# Patient Record
Sex: Female | Born: 1940
Health system: Southern US, Community
[De-identification: ages and names within clinical notes are randomized; demographics above are authoritative.]

## PROBLEM LIST (undated history)

## (undated) DIAGNOSIS — C50919 Malignant neoplasm of unspecified site of unspecified female breast: Secondary | ICD-10-CM

## (undated) DIAGNOSIS — R42 Dizziness and giddiness: Secondary | ICD-10-CM

## (undated) DIAGNOSIS — D701 Agranulocytosis secondary to cancer chemotherapy: Secondary | ICD-10-CM

## (undated) DIAGNOSIS — T451X5A Adverse effect of antineoplastic and immunosuppressive drugs, initial encounter: Secondary | ICD-10-CM

## (undated) DIAGNOSIS — Z5181 Encounter for therapeutic drug level monitoring: Secondary | ICD-10-CM

## (undated) DIAGNOSIS — G459 Transient cerebral ischemic attack, unspecified: Secondary | ICD-10-CM

## (undated) DIAGNOSIS — M545 Low back pain, unspecified: Secondary | ICD-10-CM

## (undated) DIAGNOSIS — Z7189 Other specified counseling: Secondary | ICD-10-CM

## (undated) DIAGNOSIS — M1990 Unspecified osteoarthritis, unspecified site: Secondary | ICD-10-CM

## (undated) DIAGNOSIS — E785 Hyperlipidemia, unspecified: Secondary | ICD-10-CM

## (undated) DIAGNOSIS — I10 Essential (primary) hypertension: Secondary | ICD-10-CM

## (undated) DIAGNOSIS — F419 Anxiety disorder, unspecified: Secondary | ICD-10-CM

## (undated) DIAGNOSIS — I639 Cerebral infarction, unspecified: Secondary | ICD-10-CM

## (undated) HISTORY — DX: Anxiety disorder, unspecified: F41.9

## (undated) HISTORY — DX: Agranulocytosis secondary to cancer chemotherapy: D70.1

## (undated) HISTORY — DX: Other specified counseling: Z71.89

## (undated) HISTORY — DX: Cerebral infarction, unspecified: I63.9

## (undated) HISTORY — DX: Unspecified osteoarthritis, unspecified site: M19.90

## (undated) HISTORY — DX: Low back pain, unspecified: M54.50

## (undated) HISTORY — DX: Encounter for therapeutic drug level monitoring: Z51.81

## (undated) HISTORY — DX: Adverse effect of antineoplastic and immunosuppressive drugs, initial encounter: T45.1X5A

## (undated) HISTORY — DX: Low back pain: M54.5

## (undated) HISTORY — PX: BREAST LUMPECTOMY: SHX2

## (undated) HISTORY — DX: Essential (primary) hypertension: I10

## (undated) HISTORY — DX: Malignant neoplasm of unspecified site of unspecified female breast: C50.919

## (undated) HISTORY — DX: Hyperlipidemia, unspecified: E78.5

## (undated) HISTORY — DX: Transient cerebral ischemic attack, unspecified: G45.9

## (undated) HISTORY — PX: ABDOMINAL HYSTERECTOMY: SHX81

---

## 2001-04-28 ENCOUNTER — Encounter: Payer: Self-pay | Admitting: Obstetrics

## 2001-04-28 ENCOUNTER — Ambulatory Visit (HOSPITAL_COMMUNITY): Admission: RE | Admit: 2001-04-28 | Discharge: 2001-04-28 | Payer: Self-pay | Admitting: Obstetrics

## 2001-05-02 ENCOUNTER — Other Ambulatory Visit: Admission: RE | Admit: 2001-05-02 | Discharge: 2001-05-02 | Payer: Self-pay | Admitting: Obstetrics

## 2001-05-02 ENCOUNTER — Encounter: Payer: Self-pay | Admitting: Obstetrics

## 2001-05-02 ENCOUNTER — Encounter: Admission: RE | Admit: 2001-05-02 | Discharge: 2001-05-02 | Payer: Self-pay | Admitting: Obstetrics

## 2001-05-02 ENCOUNTER — Encounter (INDEPENDENT_AMBULATORY_CARE_PROVIDER_SITE_OTHER): Payer: Self-pay | Admitting: *Deleted

## 2001-05-17 ENCOUNTER — Encounter (HOSPITAL_BASED_OUTPATIENT_CLINIC_OR_DEPARTMENT_OTHER): Payer: Self-pay | Admitting: General Surgery

## 2001-05-18 ENCOUNTER — Ambulatory Visit (HOSPITAL_COMMUNITY): Admission: RE | Admit: 2001-05-18 | Discharge: 2001-05-19 | Payer: Self-pay | Admitting: General Surgery

## 2001-05-18 ENCOUNTER — Encounter (HOSPITAL_BASED_OUTPATIENT_CLINIC_OR_DEPARTMENT_OTHER): Payer: Self-pay | Admitting: General Surgery

## 2001-05-18 ENCOUNTER — Encounter (INDEPENDENT_AMBULATORY_CARE_PROVIDER_SITE_OTHER): Payer: Self-pay | Admitting: *Deleted

## 2001-05-20 ENCOUNTER — Emergency Department (HOSPITAL_COMMUNITY): Admission: EM | Admit: 2001-05-20 | Discharge: 2001-05-20 | Payer: Self-pay | Admitting: *Deleted

## 2001-06-21 ENCOUNTER — Ambulatory Visit (HOSPITAL_COMMUNITY): Admission: RE | Admit: 2001-06-21 | Discharge: 2001-06-21 | Payer: Self-pay | Admitting: Hematology and Oncology

## 2001-06-21 ENCOUNTER — Encounter: Payer: Self-pay | Admitting: Hematology and Oncology

## 2001-06-27 ENCOUNTER — Ambulatory Visit (HOSPITAL_COMMUNITY): Admission: RE | Admit: 2001-06-27 | Discharge: 2001-06-27 | Payer: Self-pay | Admitting: General Surgery

## 2001-06-27 ENCOUNTER — Encounter (HOSPITAL_BASED_OUTPATIENT_CLINIC_OR_DEPARTMENT_OTHER): Payer: Self-pay | Admitting: General Surgery

## 2002-01-11 ENCOUNTER — Ambulatory Visit: Admission: RE | Admit: 2002-01-11 | Discharge: 2002-04-11 | Payer: Self-pay | Admitting: Radiation Oncology

## 2002-01-24 ENCOUNTER — Encounter: Admission: RE | Admit: 2002-01-24 | Discharge: 2002-01-24 | Payer: Self-pay | Admitting: *Deleted

## 2002-01-30 ENCOUNTER — Encounter: Payer: Self-pay | Admitting: Hematology and Oncology

## 2002-01-30 ENCOUNTER — Ambulatory Visit (HOSPITAL_COMMUNITY): Admission: RE | Admit: 2002-01-30 | Discharge: 2002-01-30 | Payer: Self-pay | Admitting: Hematology and Oncology

## 2002-04-19 ENCOUNTER — Ambulatory Visit (HOSPITAL_COMMUNITY): Admission: RE | Admit: 2002-04-19 | Discharge: 2002-04-19 | Payer: Self-pay | Admitting: Hematology and Oncology

## 2002-04-19 ENCOUNTER — Encounter: Payer: Self-pay | Admitting: Hematology and Oncology

## 2002-07-03 ENCOUNTER — Ambulatory Visit (HOSPITAL_COMMUNITY): Admission: RE | Admit: 2002-07-03 | Discharge: 2002-07-03 | Payer: Self-pay | Admitting: General Surgery

## 2002-11-05 ENCOUNTER — Encounter: Admission: RE | Admit: 2002-11-05 | Discharge: 2002-11-05 | Payer: Self-pay | Admitting: Radiation Oncology

## 2003-11-03 ENCOUNTER — Ambulatory Visit (HOSPITAL_COMMUNITY): Admission: RE | Admit: 2003-11-03 | Discharge: 2003-11-03 | Payer: Self-pay | Admitting: Family Medicine

## 2004-06-26 ENCOUNTER — Encounter: Admission: RE | Admit: 2004-06-26 | Discharge: 2004-06-26 | Payer: Self-pay | Admitting: Oncology

## 2004-07-03 ENCOUNTER — Encounter: Admission: RE | Admit: 2004-07-03 | Discharge: 2004-07-03 | Payer: Self-pay | Admitting: Oncology

## 2004-11-05 ENCOUNTER — Ambulatory Visit: Payer: Self-pay | Admitting: Oncology

## 2005-02-16 ENCOUNTER — Ambulatory Visit: Payer: Self-pay | Admitting: Internal Medicine

## 2005-03-17 ENCOUNTER — Ambulatory Visit: Payer: Self-pay | Admitting: Internal Medicine

## 2005-03-18 ENCOUNTER — Ambulatory Visit: Payer: Self-pay | Admitting: Internal Medicine

## 2005-03-30 ENCOUNTER — Encounter: Admission: RE | Admit: 2005-03-30 | Discharge: 2005-03-30 | Payer: Self-pay | Admitting: Internal Medicine

## 2005-04-07 ENCOUNTER — Encounter: Admission: RE | Admit: 2005-04-07 | Discharge: 2005-04-07 | Payer: Self-pay | Admitting: Internal Medicine

## 2005-04-14 ENCOUNTER — Ambulatory Visit: Payer: Self-pay | Admitting: Internal Medicine

## 2005-06-09 ENCOUNTER — Ambulatory Visit: Payer: Self-pay | Admitting: Internal Medicine

## 2005-06-10 ENCOUNTER — Ambulatory Visit: Payer: Self-pay | Admitting: Internal Medicine

## 2005-07-14 ENCOUNTER — Ambulatory Visit: Payer: Self-pay | Admitting: Internal Medicine

## 2005-08-04 ENCOUNTER — Ambulatory Visit: Payer: Self-pay

## 2005-08-18 ENCOUNTER — Ambulatory Visit: Payer: Self-pay | Admitting: Internal Medicine

## 2005-08-25 ENCOUNTER — Ambulatory Visit: Payer: Self-pay | Admitting: Internal Medicine

## 2005-09-09 ENCOUNTER — Ambulatory Visit: Payer: Self-pay | Admitting: Internal Medicine

## 2005-11-19 ENCOUNTER — Ambulatory Visit: Payer: Self-pay | Admitting: Internal Medicine

## 2005-12-09 ENCOUNTER — Ambulatory Visit: Payer: Self-pay | Admitting: Internal Medicine

## 2006-03-18 ENCOUNTER — Ambulatory Visit: Payer: Self-pay | Admitting: Internal Medicine

## 2006-03-26 ENCOUNTER — Encounter: Admission: RE | Admit: 2006-03-26 | Discharge: 2006-03-26 | Payer: Self-pay | Admitting: Internal Medicine

## 2006-04-06 ENCOUNTER — Ambulatory Visit: Payer: Self-pay | Admitting: Internal Medicine

## 2006-04-08 ENCOUNTER — Encounter: Admission: RE | Admit: 2006-04-08 | Discharge: 2006-04-08 | Payer: Self-pay | Admitting: Internal Medicine

## 2006-04-08 LAB — COMPREHENSIVE METABOLIC PANEL
ALT: 9 U/L (ref 0–40)
AST: 12 U/L (ref 0–37)
Albumin: 4.2 g/dL (ref 3.5–5.2)
BUN: 13 mg/dL (ref 6–23)
CO2: 26 mEq/L (ref 19–32)
Calcium: 9.7 mg/dL (ref 8.4–10.5)
Chloride: 103 mEq/L (ref 96–112)
Creatinine, Ser: 1.03 mg/dL (ref 0.40–1.20)
Potassium: 4.6 mEq/L (ref 3.5–5.3)

## 2006-04-08 LAB — CBC WITH DIFFERENTIAL/PLATELET
BASO%: 0.5 % (ref 0.0–2.0)
Basophils Absolute: 0 10*3/uL (ref 0.0–0.1)
EOS%: 2.2 % (ref 0.0–7.0)
HCT: 40.2 % (ref 34.8–46.6)
HGB: 13.6 g/dL (ref 11.6–15.9)
MCH: 29.8 pg (ref 26.0–34.0)
MONO#: 0.6 10*3/uL (ref 0.1–0.9)
NEUT%: 62.1 % (ref 39.6–76.8)
RDW: 14.1 % (ref 11.3–14.5)
WBC: 6.5 10*3/uL (ref 3.9–10.0)
lymph#: 1.7 10*3/uL (ref 0.9–3.3)

## 2006-04-08 LAB — CANCER ANTIGEN 27.29: CA 27.29: 21 U/mL (ref 0–39)

## 2006-07-07 ENCOUNTER — Ambulatory Visit: Payer: Self-pay | Admitting: Internal Medicine

## 2006-08-01 ENCOUNTER — Ambulatory Visit: Payer: Self-pay | Admitting: Internal Medicine

## 2006-10-07 ENCOUNTER — Ambulatory Visit: Payer: Self-pay | Admitting: Internal Medicine

## 2006-11-08 ENCOUNTER — Ambulatory Visit: Payer: Self-pay | Admitting: Internal Medicine

## 2006-11-15 ENCOUNTER — Ambulatory Visit: Payer: Self-pay | Admitting: Internal Medicine

## 2006-11-18 LAB — COMPREHENSIVE METABOLIC PANEL
ALT: 8 U/L (ref 0–35)
AST: 12 U/L (ref 0–37)
Albumin: 4.3 g/dL (ref 3.5–5.2)
Alkaline Phosphatase: 107 U/L (ref 39–117)
BUN: 18 mg/dL (ref 6–23)
Chloride: 103 mEq/L (ref 96–112)
Potassium: 4.2 mEq/L (ref 3.5–5.3)
Sodium: 141 mEq/L (ref 135–145)
Total Protein: 7.6 g/dL (ref 6.0–8.3)

## 2006-11-18 LAB — CBC WITH DIFFERENTIAL/PLATELET
BASO%: 1.1 % (ref 0.0–2.0)
EOS%: 2.4 % (ref 0.0–7.0)
MCH: 29.3 pg (ref 26.0–34.0)
MCV: 89 fL (ref 81.0–101.0)
MONO%: 14.3 % — ABNORMAL HIGH (ref 0.0–13.0)
RBC: 4.63 10*6/uL (ref 3.70–5.32)
RDW: 12 % (ref 11.3–14.5)
lymph#: 1.4 10*3/uL (ref 0.9–3.3)

## 2007-02-14 ENCOUNTER — Ambulatory Visit: Payer: Self-pay | Admitting: Internal Medicine

## 2007-02-24 LAB — CBC WITH DIFFERENTIAL/PLATELET
BASO%: 0.5 % (ref 0.0–2.0)
EOS%: 2 % (ref 0.0–7.0)
HCT: 37.2 % (ref 34.8–46.6)
LYMPH%: 34.4 % (ref 14.0–48.0)
MCH: 30.6 pg (ref 26.0–34.0)
MCHC: 34.8 g/dL (ref 32.0–36.0)
MCV: 87.9 fL (ref 81.0–101.0)
MONO#: 0.5 10*3/uL (ref 0.1–0.9)
MONO%: 7.9 % (ref 0.0–13.0)
NEUT%: 55.2 % (ref 39.6–76.8)
Platelets: 295 10*3/uL (ref 145–400)
RBC: 4.23 10*6/uL (ref 3.70–5.32)

## 2007-02-24 LAB — COMPREHENSIVE METABOLIC PANEL
AST: 13 U/L (ref 0–37)
BUN: 14 mg/dL (ref 6–23)
Calcium: 9 mg/dL (ref 8.4–10.5)
Chloride: 107 mEq/L (ref 96–112)
Creatinine, Ser: 0.99 mg/dL (ref 0.40–1.20)
Total Bilirubin: 0.4 mg/dL (ref 0.3–1.2)

## 2007-04-14 ENCOUNTER — Encounter: Admission: RE | Admit: 2007-04-14 | Discharge: 2007-04-14 | Payer: Self-pay | Admitting: Internal Medicine

## 2007-05-31 ENCOUNTER — Ambulatory Visit: Payer: Self-pay | Admitting: Internal Medicine

## 2007-06-02 LAB — CBC WITH DIFFERENTIAL/PLATELET
Basophils Absolute: 0 10*3/uL (ref 0.0–0.1)
HCT: 38.5 % (ref 34.8–46.6)
HGB: 13.3 g/dL (ref 11.6–15.9)
MCH: 30.1 pg (ref 26.0–34.0)
MONO#: 0.7 10*3/uL (ref 0.1–0.9)
NEUT%: 65.3 % (ref 39.6–76.8)
Platelets: 286 10*3/uL (ref 145–400)
WBC: 7 10*3/uL (ref 3.9–10.0)
lymph#: 1.5 10*3/uL (ref 0.9–3.3)

## 2007-06-02 LAB — COMPREHENSIVE METABOLIC PANEL
BUN: 16 mg/dL (ref 6–23)
CO2: 24 mEq/L (ref 19–32)
Calcium: 9.4 mg/dL (ref 8.4–10.5)
Chloride: 104 mEq/L (ref 96–112)
Creatinine, Ser: 1.07 mg/dL (ref 0.40–1.20)

## 2007-09-06 ENCOUNTER — Ambulatory Visit: Payer: Self-pay | Admitting: Internal Medicine

## 2007-09-08 LAB — COMPREHENSIVE METABOLIC PANEL
ALT: 8 U/L (ref 0–35)
AST: 13 U/L (ref 0–37)
Albumin: 4.1 g/dL (ref 3.5–5.2)
Alkaline Phosphatase: 98 U/L (ref 39–117)
Potassium: 4.4 mEq/L (ref 3.5–5.3)
Sodium: 140 mEq/L (ref 135–145)
Total Protein: 7.7 g/dL (ref 6.0–8.3)

## 2007-09-08 LAB — CBC WITH DIFFERENTIAL/PLATELET
BASO%: 0.4 % (ref 0.0–2.0)
EOS%: 2.3 % (ref 0.0–7.0)
Eosinophils Absolute: 0.2 10*3/uL (ref 0.0–0.5)
MCH: 29.7 pg (ref 26.0–34.0)
MCV: 87.3 fL (ref 81.0–101.0)
MONO%: 8.6 % (ref 0.0–13.0)
NEUT#: 4.5 10*3/uL (ref 1.5–6.5)
RBC: 4.42 10*6/uL (ref 3.70–5.32)
RDW: 14 % (ref 11.3–14.5)

## 2007-10-02 ENCOUNTER — Encounter: Payer: Self-pay | Admitting: Internal Medicine

## 2007-10-02 DIAGNOSIS — I10 Essential (primary) hypertension: Secondary | ICD-10-CM | POA: Insufficient documentation

## 2007-10-02 DIAGNOSIS — J45909 Unspecified asthma, uncomplicated: Secondary | ICD-10-CM | POA: Insufficient documentation

## 2007-10-02 DIAGNOSIS — E7210 Disorders of sulfur-bearing amino-acid metabolism, unspecified: Secondary | ICD-10-CM | POA: Insufficient documentation

## 2007-10-03 ENCOUNTER — Ambulatory Visit: Payer: Self-pay | Admitting: Internal Medicine

## 2007-10-03 DIAGNOSIS — J019 Acute sinusitis, unspecified: Secondary | ICD-10-CM | POA: Insufficient documentation

## 2007-10-03 DIAGNOSIS — R5383 Other fatigue: Secondary | ICD-10-CM

## 2007-10-03 DIAGNOSIS — R5381 Other malaise: Secondary | ICD-10-CM

## 2007-10-05 LAB — CONVERTED CEMR LAB
ALT: 11 units/L (ref 0–35)
Albumin: 3.4 g/dL — ABNORMAL LOW (ref 3.5–5.2)
Alkaline Phosphatase: 88 units/L (ref 39–117)
BUN: 10 mg/dL (ref 6–23)
CO2: 27 meq/L (ref 19–32)
Calcium: 9.6 mg/dL (ref 8.4–10.5)
GFR calc Af Amer: 72 mL/min
Nitrite: POSITIVE — AB
RBC / HPF: NONE SEEN
Total Protein: 7.9 g/dL (ref 6.0–8.3)
Urobilinogen, UA: 0.2 (ref 0.0–1.0)

## 2008-03-05 ENCOUNTER — Ambulatory Visit: Payer: Self-pay | Admitting: Internal Medicine

## 2008-03-29 ENCOUNTER — Ambulatory Visit (HOSPITAL_COMMUNITY): Admission: RE | Admit: 2008-03-29 | Discharge: 2008-03-29 | Payer: Self-pay | Admitting: Internal Medicine

## 2008-04-12 ENCOUNTER — Ambulatory Visit: Payer: Self-pay | Admitting: Internal Medicine

## 2008-04-12 DIAGNOSIS — L732 Hidradenitis suppurativa: Secondary | ICD-10-CM | POA: Insufficient documentation

## 2008-04-19 ENCOUNTER — Encounter: Admission: RE | Admit: 2008-04-19 | Discharge: 2008-04-19 | Payer: Self-pay | Admitting: Internal Medicine

## 2008-08-09 ENCOUNTER — Telehealth: Payer: Self-pay | Admitting: Internal Medicine

## 2008-08-16 ENCOUNTER — Ambulatory Visit: Payer: Self-pay | Admitting: Internal Medicine

## 2008-08-16 LAB — CONVERTED CEMR LAB
ALT: 14 units/L (ref 0–35)
Albumin: 3.6 g/dL (ref 3.5–5.2)
Alkaline Phosphatase: 101 units/L (ref 39–117)
BUN: 10 mg/dL (ref 6–23)
Basophils Relative: 0.3 % (ref 0.0–3.0)
Bilirubin Urine: NEGATIVE
Bilirubin, Direct: 0.1 mg/dL (ref 0.0–0.3)
CO2: 28 meq/L (ref 19–32)
Calcium: 9.2 mg/dL (ref 8.4–10.5)
Crystals: NEGATIVE
Eosinophils Relative: 1.2 % (ref 0.0–5.0)
GFR calc Af Amer: 81 mL/min
Glucose, Bld: 100 mg/dL — ABNORMAL HIGH (ref 70–99)
HCT: 38.9 % (ref 36.0–46.0)
Hemoglobin: 13.2 g/dL (ref 12.0–15.0)
Lymphocytes Relative: 22.4 % (ref 12.0–46.0)
Monocytes Absolute: 0.5 10*3/uL (ref 0.1–1.0)
Monocytes Relative: 8 % (ref 3.0–12.0)
Neutro Abs: 4.5 10*3/uL (ref 1.4–7.7)
Nitrite: NEGATIVE
RBC: 4.23 M/uL (ref 3.87–5.11)
Specific Gravity, Urine: 1.02 (ref 1.000–1.03)
TSH: 0.6 microintl units/mL (ref 0.35–5.50)
Total Protein, Urine: NEGATIVE mg/dL
Total Protein: 7.6 g/dL (ref 6.0–8.3)
WBC: 6.6 10*3/uL (ref 4.5–10.5)
pH: 6 (ref 5.0–8.0)

## 2008-08-21 LAB — CONVERTED CEMR LAB: Vit D, 1,25-Dihydroxy: 10 — ABNORMAL LOW (ref 30–89)

## 2008-09-13 ENCOUNTER — Ambulatory Visit: Payer: Self-pay | Admitting: Internal Medicine

## 2008-09-17 LAB — CBC WITH DIFFERENTIAL/PLATELET
BASO%: 0.7 % (ref 0.0–2.0)
Basophils Absolute: 0.1 10*3/uL (ref 0.0–0.1)
HCT: 40.9 % (ref 34.8–46.6)
HGB: 13.7 g/dL (ref 11.6–15.9)
MCHC: 33.6 g/dL (ref 32.0–36.0)
MONO#: 0.6 10*3/uL (ref 0.1–0.9)
NEUT%: 59.9 % (ref 39.6–76.8)
RDW: 13.6 % (ref 11.3–14.5)
WBC: 6.8 10*3/uL (ref 3.9–10.0)
lymph#: 1.9 10*3/uL (ref 0.9–3.3)

## 2008-09-18 LAB — COMPREHENSIVE METABOLIC PANEL
ALT: 8 U/L (ref 0–35)
AST: 12 U/L (ref 0–37)
Albumin: 4.2 g/dL (ref 3.5–5.2)
CO2: 24 mEq/L (ref 19–32)
Calcium: 10 mg/dL (ref 8.4–10.5)
Chloride: 102 mEq/L (ref 96–112)
Creatinine, Ser: 0.95 mg/dL (ref 0.40–1.20)
Potassium: 4.1 mEq/L (ref 3.5–5.3)

## 2008-09-18 LAB — CANCER ANTIGEN 27.29: CA 27.29: 26 U/mL (ref 0–39)

## 2008-09-27 ENCOUNTER — Emergency Department (HOSPITAL_COMMUNITY): Admission: EM | Admit: 2008-09-27 | Discharge: 2008-09-27 | Payer: Self-pay | Admitting: Emergency Medicine

## 2009-03-07 ENCOUNTER — Ambulatory Visit: Payer: Self-pay | Admitting: Internal Medicine

## 2009-03-07 DIAGNOSIS — E559 Vitamin D deficiency, unspecified: Secondary | ICD-10-CM | POA: Insufficient documentation

## 2009-03-18 ENCOUNTER — Ambulatory Visit: Payer: Self-pay | Admitting: Internal Medicine

## 2009-03-20 LAB — COMPREHENSIVE METABOLIC PANEL
CO2: 26 mEq/L (ref 19–32)
Calcium: 9.5 mg/dL (ref 8.4–10.5)
Creatinine, Ser: 1.1 mg/dL (ref 0.40–1.20)
Glucose, Bld: 94 mg/dL (ref 70–99)
Total Bilirubin: 0.3 mg/dL (ref 0.3–1.2)

## 2009-03-20 LAB — CBC WITH DIFFERENTIAL/PLATELET
Eosinophils Absolute: 0.2 10*3/uL (ref 0.0–0.5)
HGB: 13.3 g/dL (ref 11.6–15.9)
LYMPH%: 33.9 % (ref 14.0–49.7)
MONO#: 0.6 10*3/uL (ref 0.1–0.9)
NEUT#: 3.8 10*3/uL (ref 1.5–6.5)
Platelets: 267 10*3/uL (ref 145–400)
RBC: 4.44 10*6/uL (ref 3.70–5.45)
RDW: 13.9 % (ref 11.2–14.5)
WBC: 7 10*3/uL (ref 3.9–10.3)

## 2009-03-20 LAB — CANCER ANTIGEN 27.29: CA 27.29: 18 U/mL (ref 0–39)

## 2009-03-24 ENCOUNTER — Ambulatory Visit (HOSPITAL_COMMUNITY): Admission: RE | Admit: 2009-03-24 | Discharge: 2009-03-24 | Payer: Self-pay | Admitting: Internal Medicine

## 2009-04-16 ENCOUNTER — Ambulatory Visit: Payer: Self-pay

## 2009-04-16 ENCOUNTER — Ambulatory Visit: Payer: Self-pay | Admitting: Internal Medicine

## 2009-04-16 DIAGNOSIS — M79609 Pain in unspecified limb: Secondary | ICD-10-CM

## 2009-06-17 ENCOUNTER — Ambulatory Visit: Payer: Self-pay | Admitting: Internal Medicine

## 2009-09-12 ENCOUNTER — Ambulatory Visit: Payer: Self-pay | Admitting: Internal Medicine

## 2009-09-16 LAB — COMPREHENSIVE METABOLIC PANEL
Albumin: 3.9 g/dL (ref 3.5–5.2)
Alkaline Phosphatase: 82 U/L (ref 39–117)
BUN: 16 mg/dL (ref 6–23)
Creatinine, Ser: 1.07 mg/dL (ref 0.40–1.20)
Glucose, Bld: 99 mg/dL (ref 70–99)
Potassium: 4.5 mEq/L (ref 3.5–5.3)

## 2009-09-16 LAB — CBC WITH DIFFERENTIAL/PLATELET
Basophils Absolute: 0.1 10*3/uL (ref 0.0–0.1)
Eosinophils Absolute: 0.2 10*3/uL (ref 0.0–0.5)
HCT: 38.3 % (ref 34.8–46.6)
HGB: 12.8 g/dL (ref 11.6–15.9)
LYMPH%: 29.1 % (ref 14.0–49.7)
MCV: 90.3 fL (ref 79.5–101.0)
MONO%: 9.7 % (ref 0.0–14.0)
NEUT#: 3.6 10*3/uL (ref 1.5–6.5)
NEUT%: 56.7 % (ref 38.4–76.8)
Platelets: 240 10*3/uL (ref 145–400)
RDW: 13.7 % (ref 11.2–14.5)

## 2009-09-19 ENCOUNTER — Ambulatory Visit: Payer: Self-pay | Admitting: Internal Medicine

## 2009-09-19 DIAGNOSIS — F17200 Nicotine dependence, unspecified, uncomplicated: Secondary | ICD-10-CM

## 2009-09-19 DIAGNOSIS — J069 Acute upper respiratory infection, unspecified: Secondary | ICD-10-CM | POA: Insufficient documentation

## 2009-10-07 ENCOUNTER — Encounter: Admission: RE | Admit: 2009-10-07 | Discharge: 2009-10-07 | Payer: Self-pay | Admitting: Internal Medicine

## 2009-12-19 ENCOUNTER — Ambulatory Visit: Payer: Self-pay | Admitting: Internal Medicine

## 2009-12-19 DIAGNOSIS — R635 Abnormal weight gain: Secondary | ICD-10-CM

## 2009-12-19 DIAGNOSIS — N3090 Cystitis, unspecified without hematuria: Secondary | ICD-10-CM | POA: Insufficient documentation

## 2009-12-22 LAB — CONVERTED CEMR LAB
ALT: 14 units/L (ref 0–35)
Albumin: 3.6 g/dL (ref 3.5–5.2)
BUN: 10 mg/dL (ref 6–23)
Bilirubin, Direct: 0.1 mg/dL (ref 0.0–0.3)
CO2: 28 meq/L (ref 19–32)
Calcium: 9.4 mg/dL (ref 8.4–10.5)
Chloride: 107 meq/L (ref 96–112)
Cholesterol: 174 mg/dL (ref 0–200)
Creatinine, Ser: 1 mg/dL (ref 0.4–1.2)
HDL: 61.2 mg/dL (ref >39.00)
Nitrite: NEGATIVE
Specific Gravity, Urine: 1.025 (ref 1.000–1.030)
Total Protein, Urine: NEGATIVE mg/dL
Total Protein: 7.9 g/dL (ref 6.0–8.3)
Triglycerides: 50 mg/dL (ref 0.0–149.0)
Urine Glucose: NEGATIVE mg/dL
pH: 6 (ref 5.0–8.0)

## 2010-02-14 ENCOUNTER — Inpatient Hospital Stay (HOSPITAL_COMMUNITY): Admission: EM | Admit: 2010-02-14 | Discharge: 2010-02-15 | Payer: Self-pay | Admitting: Emergency Medicine

## 2010-02-15 ENCOUNTER — Encounter (INDEPENDENT_AMBULATORY_CARE_PROVIDER_SITE_OTHER): Payer: Self-pay | Admitting: Internal Medicine

## 2010-02-16 ENCOUNTER — Telehealth (INDEPENDENT_AMBULATORY_CARE_PROVIDER_SITE_OTHER): Payer: Self-pay | Admitting: *Deleted

## 2010-02-18 ENCOUNTER — Ambulatory Visit: Payer: Self-pay | Admitting: Internal Medicine

## 2010-02-18 DIAGNOSIS — E785 Hyperlipidemia, unspecified: Secondary | ICD-10-CM

## 2010-02-18 DIAGNOSIS — Z8679 Personal history of other diseases of the circulatory system: Secondary | ICD-10-CM | POA: Insufficient documentation

## 2010-03-16 ENCOUNTER — Ambulatory Visit: Payer: Self-pay | Admitting: Internal Medicine

## 2010-03-16 LAB — COMPREHENSIVE METABOLIC PANEL
CO2: 24 mEq/L (ref 19–32)
Creatinine, Ser: 1.09 mg/dL (ref 0.40–1.20)
Glucose, Bld: 118 mg/dL — ABNORMAL HIGH (ref 70–99)
Total Bilirubin: 0.3 mg/dL (ref 0.3–1.2)

## 2010-03-16 LAB — CBC WITH DIFFERENTIAL/PLATELET
Eosinophils Absolute: 0.3 10*3/uL (ref 0.0–0.5)
HCT: 38.2 % (ref 34.8–46.6)
LYMPH%: 31.7 % (ref 14.0–49.7)
MCHC: 32.7 g/dL (ref 31.5–36.0)
MCV: 89.7 fL (ref 79.5–101.0)
MONO#: 0.5 10*3/uL (ref 0.1–0.9)
MONO%: 7.4 % (ref 0.0–14.0)
NEUT#: 3.5 10*3/uL (ref 1.5–6.5)
NEUT%: 55.4 % (ref 38.4–76.8)
Platelets: 215 10*3/uL (ref 145–400)
WBC: 6.4 10*3/uL (ref 3.9–10.3)

## 2010-03-23 ENCOUNTER — Encounter: Payer: Self-pay | Admitting: Internal Medicine

## 2010-06-09 ENCOUNTER — Ambulatory Visit: Payer: Self-pay | Admitting: Internal Medicine

## 2010-08-26 ENCOUNTER — Encounter (INDEPENDENT_AMBULATORY_CARE_PROVIDER_SITE_OTHER): Payer: Self-pay | Admitting: *Deleted

## 2010-08-26 ENCOUNTER — Ambulatory Visit: Payer: Self-pay | Admitting: Internal Medicine

## 2010-08-26 DIAGNOSIS — R112 Nausea with vomiting, unspecified: Secondary | ICD-10-CM | POA: Insufficient documentation

## 2010-08-27 LAB — CONVERTED CEMR LAB
AST: 19 units/L (ref 0–37)
Basophils Absolute: 0.1 10*3/uL (ref 0.0–0.1)
Bilirubin, Direct: 0.1 mg/dL (ref 0.0–0.3)
HCT: 38 % (ref 36.0–46.0)
Lymphs Abs: 1.7 10*3/uL (ref 0.7–4.0)
Monocytes Relative: 9.6 % (ref 3.0–12.0)
Neutrophils Relative %: 63.3 % (ref 43.0–77.0)
Platelets: 274 10*3/uL (ref 150.0–400.0)
RDW: 14.3 % (ref 11.5–14.6)
Total Bilirubin: 0.3 mg/dL (ref 0.3–1.2)
WBC: 7 10*3/uL (ref 4.5–10.5)

## 2010-09-14 ENCOUNTER — Ambulatory Visit: Payer: Self-pay | Admitting: Internal Medicine

## 2010-09-16 LAB — CBC WITH DIFFERENTIAL/PLATELET
BASO%: 0.6 % (ref 0.0–2.0)
Basophils Absolute: 0 10*3/uL (ref 0.0–0.1)
EOS%: 2.4 % (ref 0.0–7.0)
HCT: 36 % (ref 34.8–46.6)
HGB: 12.2 g/dL (ref 11.6–15.9)
LYMPH%: 26.9 % (ref 14.0–49.7)
MCH: 30.4 pg (ref 25.1–34.0)
MCHC: 34 g/dL (ref 31.5–36.0)
NEUT%: 59.8 % (ref 38.4–76.8)
Platelets: 303 10*3/uL (ref 145–400)
lymph#: 1.9 10*3/uL (ref 0.9–3.3)

## 2010-09-16 LAB — COMPREHENSIVE METABOLIC PANEL
ALT: 10 U/L (ref 0–35)
AST: 12 U/L (ref 0–37)
BUN: 17 mg/dL (ref 6–23)
CO2: 23 mEq/L (ref 19–32)
Calcium: 9.4 mg/dL (ref 8.4–10.5)
Chloride: 104 mEq/L (ref 96–112)
Creatinine, Ser: 1.05 mg/dL (ref 0.40–1.20)
Total Bilirubin: 0.3 mg/dL (ref 0.3–1.2)

## 2010-09-16 LAB — CANCER ANTIGEN 27.29: CA 27.29: 22 U/mL (ref 0–39)

## 2010-09-23 ENCOUNTER — Encounter: Payer: Self-pay | Admitting: Internal Medicine

## 2010-10-07 ENCOUNTER — Ambulatory Visit: Payer: Self-pay | Admitting: Internal Medicine

## 2010-10-07 DIAGNOSIS — M25559 Pain in unspecified hip: Secondary | ICD-10-CM

## 2010-10-20 ENCOUNTER — Encounter
Admission: RE | Admit: 2010-10-20 | Discharge: 2010-10-20 | Payer: Self-pay | Source: Home / Self Care | Attending: Internal Medicine | Admitting: Internal Medicine

## 2010-10-20 ENCOUNTER — Telehealth: Payer: Self-pay | Admitting: Internal Medicine

## 2010-10-21 ENCOUNTER — Ambulatory Visit: Payer: Self-pay | Admitting: Internal Medicine

## 2010-10-21 DIAGNOSIS — M545 Low back pain: Secondary | ICD-10-CM

## 2010-10-21 DIAGNOSIS — M1990 Unspecified osteoarthritis, unspecified site: Secondary | ICD-10-CM

## 2010-11-01 HISTORY — PX: JOINT REPLACEMENT: SHX530

## 2010-11-03 ENCOUNTER — Ambulatory Visit
Admission: RE | Admit: 2010-11-03 | Discharge: 2010-11-03 | Payer: Self-pay | Source: Home / Self Care | Attending: Internal Medicine | Admitting: Internal Medicine

## 2010-11-03 ENCOUNTER — Encounter
Admission: RE | Admit: 2010-11-03 | Discharge: 2010-11-03 | Payer: Self-pay | Source: Home / Self Care | Attending: Internal Medicine | Admitting: Internal Medicine

## 2010-11-03 DIAGNOSIS — F418 Other specified anxiety disorders: Secondary | ICD-10-CM | POA: Insufficient documentation

## 2010-11-19 ENCOUNTER — Encounter: Payer: Self-pay | Admitting: Internal Medicine

## 2010-12-03 NOTE — Miscellaneous (Signed)
Summary: Order and  report/[facility]  Order and  report/[facility] By: Bubba Hales 11/24/2010 11:51:37  _____________________________________________________________________  External Attachment:    Type:   Image     Comment:   External Document

## 2010-12-03 NOTE — Assessment & Plan Note (Signed)
Summary: nausea/runny nose/plot/cd   Vital Signs:  Patient profile:   70 year old female Height:      64 inches (162.56 cm) Weight:      220 pounds (100.00 kg) O2 Sat:      97 % on Room air Temp:     98.7 degrees F (37.06 degrees C) oral Pulse rate:   77 / minute BP sitting:   130 / 68  (left arm) Cuff size:   large  Vitals Entered By: Tomma Lightning RMA (August 26, 2010 1:04 PM)  O2 Flow:  Room air CC: Running nose/ Nausea Is Patient Diabetic? Yes Did you bring your meter with you today? No Pain Assessment Patient in pain? no        Primary Care Provider:  [name] [name] MD  CC:  Running nose/ Nausea.  History of Present Illness: feeling poorly since taking flu shot 5 days ago - required by employer feels weak, nauseated - vomitting x 2 this AM no fever -  no SOB or CP, no HA or cough  needs refills on BP and anxuety meds,  also needs "affordable" cholesterol med -     Clinical Review Panels:  Diabetes Management   HgBA1C:  6.1 (12/19/2009)   Creatinine:  1.0 (12/19/2009)   Last Pneumovax:  Pneumovax (08/01/2006)  CBC   WBC:  6.6 (08/16/2008)   RBC:  4.23 (08/16/2008)   Hgb:  13.2 (08/16/2008)   Hct:  38.9 (08/16/2008)   Platelets:  250 (08/16/2008)   MCV  91.9 (08/16/2008)   MCHC  34.1 (08/16/2008)   RDW  13.4 (08/16/2008)   PMN:  68.1 (08/16/2008)   Lymphs:  22.4 (08/16/2008)   Monos:  8.0 (08/16/2008)   Eosinophils:  1.2 (08/16/2008)   Basophil:  0.3 (08/16/2008)  Complete Metabolic Panel   Glucose:  105 (12/19/2009)   Sodium:  140 (12/19/2009)   Potassium:  4.8 (12/19/2009)   Chloride:  107 (12/19/2009)   CO2:  28 (12/19/2009)   BUN:  10 (12/19/2009)   Creatinine:  1.0 (12/19/2009)   Albumin:  3.6 (12/19/2009)   Total Protein:  7.9 (12/19/2009)   Calcium:  9.4 (12/19/2009)   Total Bili:  0.4 (12/19/2009)   Alk Phos:  91 (12/19/2009)   SGPT (ALT):  14 (12/19/2009)   SGOT (AST):  15 (12/19/2009)   Current Medications  (verified): 1)  Exforge Hct 10-320-25 Mg Tabs (Amlodipine-Valsartan-Hctz) .Marland Kitchen.. 1 By Mouth Qd 2)  Aldactone 100 Mg Tabs (Spironolactone) .Marland Kitchen.. 1 By Mouth Qd 3)  Advair Diskus 250-50 Mcg/dose  Misc (Fluticasone-Salmeterol) .... As Needed 4)  Proventil Hfa 108 (90 Base) Mcg/act  Aers (Albuterol Sulfate) .... As Needed 5)  Triamcinolone Acetonide 0.5 % Crea (Triamcinolone Acetonide) .... Apply Bid To Affected Area 6)  Lipitor 10 Mg Tabs (Atorvastatin Calcium) .Marland Kitchen.. 1 By Mouth Once Daily For Cholesterol 7)  Loratadine 10 Mg Tabs (Loratadine) .Marland Kitchen.. 1 By Mouth Once Daily As Needed Allergies 8)  Alprazolam 0.5 Mg Tabs (Alprazolam) .Marland Kitchen.. 1 By Mouth Two Times A Day As Needed Anxiety 9)  Aspirin 325 Mg Tabs (Aspirin) .Marland Kitchen.. 1 By Mouth Once Daily Pc 10)  Vitamin D3 1000 Unit  Tabs (Cholecalciferol) .... 2 Once Daily 11)  Lovastatin 20 Mg Tabs (Lovastatin) .Marland Kitchen.. 1 By Mouth Once Daily For Cholesterol  Allergies (verified): 1)  Lipitor  Past History:  Past Medical History: Asthma  Breast cancer, hx of  Hypertension Diabetes mellitus, type II R thalamic CVA 01/2010 - Cerebrovascular accident, hx of Hyperlipidemia/  dyslipidemia TIA x 2 2011  Review of Systems  The patient denies fever, chest pain, peripheral edema, headaches, and abdominal pain.    Physical Exam  General:  overweight-appearing.  alert, well-developed, well-nourished, and cooperative to examination.    Eyes:  vision grossly intact; pupils equal, round and reactive to light.  conjunctiva and lids normal.   no nystagmus Mouth:  teeth and gums in good repair; mucous membranes moist, without lesions or ulcers. oropharynx clear without exudate, no erythema.  Lungs:  normal respiratory effort, no intercostal retractions or use of accessory muscles; normal breath sounds bilaterally - no crackles and no wheezes.    Heart:  normal rate, regular rhythm, no murmur, and no rub. BLE without edema. Abdomen:  soft, non-tender, normal bowel sounds, no  distention; no masses and no appreciable hepatomegaly or splenomegaly.   Neurologic:  alert & oriented X3 and cranial nerves II-XII symetrically intact.  strength normal in all extremities, sensation intact to light touch, and gait normal. speech fluent without dysarthria or aphasia; follows commands with good comprehension.  Psych:  anxious   Impression & Recommendations:  Problem # 1:  NAUSEA AND VOMITING (ICD-787.01) abd exam benign - suspect viral illness, maybe exac by recent flu shot - check labs tx as needed promethazine - erx done out of work and rec to hydrate - f/u PCP if symptoms worse Orders: TLB-CBC Platelet - w/Differential (85025-CBCD) TLB-Hepatic/Liver Function Pnl (80076-HEPATIC) Prescription Created Electronically [phone])  Problem # 2:  FATIGUE (ICD-780.79) pt attributes to recent flu shot - check labs, f/u as needed  Orders: TLB-CBC Platelet - w/Differential (85025-CBCD) TLB-Hepatic/Liver Function Pnl (80076-HEPATIC)  Problem # 3:  HYPERLIPIDEMIA (ICD-272.4)  intol of lipitor, unable to afford lovastatin crestor on formulary - erx for same The following medications were removed from the medication list:    Lipitor 10 Mg Tabs (Atorvastatin calcium) .Marland Kitchen... 1 by mouth once daily for cholesterol Her updated medication list for this problem includes:    Crestor 10 Mg Tabs (Rosuvastatin calcium) .Marland Kitchen... 1 by mouth at bedtime  Orders: Prescription Created Electronically [phone])  Labs Reviewed: SGOT: 15 (12/19/2009)   SGPT: 14 (12/19/2009)   HDL:61.20 (12/19/2009)  LDL:103 (12/19/2009)  Chol:174 (12/19/2009)  Trig:50.0 (12/19/2009)  Complete Medication List: 1)  Exforge Hct 10-320-25 Mg Tabs (Amlodipine-valsartan-hctz) .Marland Kitchen.. 1 by mouth qd 2)  Aldactone 100 Mg Tabs (Spironolactone) .Marland Kitchen.. 1 by mouth qd 3)  Advair Diskus 250-50 Mcg/dose Misc (Fluticasone-salmeterol) .... As needed 4)  Proventil Hfa 108 (90 Base) Mcg/act Aers (Albuterol sulfate) .... As needed 5)   Triamcinolone Acetonide 0.5 % Crea (Triamcinolone acetonide) .... Apply bid to affected area 6)  Loratadine 10 Mg Tabs (Loratadine) .Marland Kitchen.. 1 by mouth once daily as needed allergies 7)  Alprazolam 0.5 Mg Tabs (Alprazolam) .Marland Kitchen.. 1 by mouth two times a day as needed anxiety 8)  Aspirin 325 Mg Tabs (Aspirin) .Marland Kitchen.. 1 by mouth once daily pc 9)  Vitamin D3 1000 Unit Tabs (Cholecalciferol) .... 2 once daily 10)  Crestor 10 Mg Tabs (Rosuvastatin calcium) .Marland Kitchen.. 1 by mouth at bedtime 11)  Promethazine Hcl 25 Mg Tabs (Promethazine hcl) .Marland Kitchen.. 1 by mouth every 4 hours as needed for nausea and vomitting - may cause sedation  Patient Instructions: 1)  it was good to see you today. 2)  test(s) ordered today - your results will be called to you after review in 24-48 hours from the time of test completion 3)  use promethazine for nausea and vomitting symptoms -  4)  use crestor for cholesterol - should be affordable 5)  your new prescriptions and refills have been electronically submitted to your pharmacy. Please take as directed. Contact our office if you believe you're having problems with the medication(s). 6)  work note for today and tomorrow as discussed  7)  Please schedule a follow-up appointment in 6-8 weeks with Dr. Alain Marion, sooner if problems.  Prescriptions: ALPRAZOLAM 0.5 MG TABS (ALPRAZOLAM) 1 by mouth two times a day as needed anxiety  #60 x 1   Entered and Authorized by:   Rowe Clack MD   Signed by:   Rowe Clack MD on 08/26/2010   Method used:   Printed then faxed to ...       [facility] (retail)       [address]       [place], [place]  [address]       Ph: [phone]       Fax: [phone]   RxID:   [id number] PROMETHAZINE HCL 25 MG TABS (PROMETHAZINE HCL) 1 by mouth every 4 hours as needed for nausea and vomitting - may cause sedation  #30 x 0   Entered and Authorized by:   Rowe Clack MD   Signed by:    Rowe Clack MD on 08/26/2010   Method used:   Electronically to        [facility] (retail)       [address], [place]  [address]       Ph: [phone]       Fax: [phone]   RxID:   [id number] CRESTOR 10 MG TABS (ROSUVASTATIN CALCIUM) 1 by mouth at bedtime  #30 x 1   Entered and Authorized by:   Rowe Clack MD   Signed by:   Rowe Clack MD on 08/26/2010   Method used:   Electronically to        [facility] (retail)       [address]       [place], [place]  [address]       Ph: [phone]       Fax: [phone]   RxID:   [id number]    Orders Added: 1)  Est. Patient Level IV [id number] 2)  TLB-CBC Platelet - w/Differential [85025-CBCD] 3)  TLB-Hepatic/Liver Function Pnl [80076-HEPATIC] 4)  Prescription Created Electronically [phone]

## 2010-12-03 NOTE — Assessment & Plan Note (Signed)
Summary: 3 MOS F/U #/CD   Vital Signs:  Patient profile:   70 year old female Weight:      221 pounds BMI:     38.07 Temp:     98.1 degrees F oral Pulse rate:   84 / minute BP sitting:   154 / 70  (left arm)  Vitals Entered By: Doralee Albino (December 19, 2009 10:31 AM) CC: f/u Is Patient Diabetic? No   CC:  f/u.  History of Present Illness: The patient presents for a follow up of hypertension, diabetes, hyperlipidemia   Preventive Screening-Counseling & Management  Alcohol-Tobacco     Smoking Status: never  Current Medications (verified): 1)  Spironolactone 50 Mg  Tabs (Spironolactone) .... Once Daily 2)  Advair Diskus 250-50 Mcg/dose  Misc (Fluticasone-Salmeterol) .... As Needed 3)  Proventil Hfa 108 (90 Base) Mcg/act  Aers (Albuterol Sulfate) .... As Needed 4)  Vitamin D3 1000 Unit  Tabs (Cholecalciferol) .Marland Kitchen.. 1 Qd 5)  Exforge Hct 10-320-25 Mg Tabs (Amlodipine-Valsartan-Hctz) .Marland Kitchen.. 1 By Mouth Qd 6)  Vitamin D 50000 Unit  Caps (Ergocalciferol) .Marland Kitchen.. 1 By Mouth Weekly 7)  Aspirin 325 Mg Tabs (Aspirin) .... Take 1 Tab By Mouth Every Day 8)  Triamcinolone Acetonide 0.5 % Crea (Triamcinolone Acetonide) .... Apply Bid To Affected Area  Allergies (verified): No Known Drug Allergies  Past History:  Past Medical History: Last updated: 04/12/2008 Asthma Breast cancer, hx of Hypertension Diabetes mellitus, type II  Social History: Last updated: 10/03/2007 Occupation: Network engineer at [employer]  Current Smoker Single  Social History: Smoking Status:  never  Review of Systems       The patient complains of weight gain.    Physical Exam  General:  overweight-appearing.   Nose:  External nasal examination shows no deformity or inflammation. Nasal mucosa are pink and moist without lesions or exudates. Mouth:  Erythematous throat mucosa and intranasal erythema.  Lungs:  Normal respiratory effort, chest expands symmetrically. Lungs are clear to auscultation, no crackles or  wheezes. Heart:  Normal rate and regular rhythm. S1 and S2 normal without gallop, murmur, click, rub or other extra sounds. Abdomen:  Bowel sounds positive,abdomen soft and non-tender without masses, organomegaly or hernias noted. Msk:  No deformity or scoliosis noted of thoracic or lumbar spine.   Extremities:  no edema B Skin:  Erythem. patches w/vesiles 1.4x .6 mm on B sides of her face x 3 Psych:  Cognition and judgment appear intact. Alert and cooperative with normal attention span and concentration. No apparent delusions, illusions, hallucinations   Impression & Recommendations:  Problem # 1:  DIABETES MELLITUS, TYPE II (ICD-250.00) Assessment Comment Only  Her updated medication list for this problem includes:    Exforge Hct 10-320-25 Mg Tabs (Amlodipine-valsartan-hctz) .Marland Kitchen... 1 by mouth qd    Aspirin 325 Mg Tabs (Aspirin) .Marland Kitchen... Take 1 tab by mouth every day  Orders: TLB-BMP (Basic Metabolic Panel-BMET) (99991111) TLB-Hepatic/Liver Function Pnl (80076-HEPATIC) TLB-Lipid Panel (80061-LIPID) TLB-A1C / Hgb A1C (Glycohemoglobin) (83036-A1C) TLB-TSH (Thyroid Stimulating Hormone) (84443-TSH) TLB-Udip ONLY (81003-UDIP)  Problem # 2:  HYPERTENSION (ICD-401.9) Assessment: Deteriorated  The following medications were removed from the medication list:    Spironolactone 50 Mg Tabs (Spironolactone) ..... Once daily Her updated medication list for this problem includes:    Exforge Hct 10-320-25 Mg Tabs (Amlodipine-valsartan-hctz) .Marland Kitchen... 1 by mouth qd    Aldactone 100 Mg Tabs (Spironolactone) .Marland Kitchen... 1 by mouth qd  Orders: TLB-BMP (Basic Metabolic Panel-BMET) (99991111) TLB-Hepatic/Liver Function Pnl (80076-HEPATIC) TLB-Lipid Panel (80061-LIPID) TLB-A1C /  Hgb A1C (Glycohemoglobin) (83036-A1C) TLB-TSH (Thyroid Stimulating Hormone) (84443-TSH) TLB-Udip ONLY (81003-UDIP)  Problem # 3:  WEIGHT GAIN, ABNORMAL (ICD-783.1) Assessment: Deteriorated See "Patient Instructions".    Problem # 4:  VITAMIN D DEFICIENCY (ICD-268.9) Assessment: Improved On prescription therapy   Problem # 5:  CYSTITIS (N7837765.9) Assessment: New  Her updated medication list for this problem includes:    Ceftin 250 Mg Tabs (Cefuroxime axetil) .Marland Kitchen... 1 by mouth two times a day for bladder infection  Complete Medication List: 1)  Exforge Hct 10-320-25 Mg Tabs (Amlodipine-valsartan-hctz) .Marland Kitchen.. 1 by mouth qd 2)  Aldactone 100 Mg Tabs (Spironolactone) .Marland Kitchen.. 1 by mouth qd 3)  Advair Diskus 250-50 Mcg/dose Misc (Fluticasone-salmeterol) .... As needed 4)  Proventil Hfa 108 (90 Base) Mcg/act Aers (Albuterol sulfate) .... As needed 5)  Vitamin D3 1000 Unit Tabs (Cholecalciferol) .Marland Kitchen.. 1 qd 6)  Vitamin D 50000 Unit Caps (Ergocalciferol) .Marland Kitchen.. 1 by mouth weekly 7)  Aspirin 325 Mg Tabs (Aspirin) .... Take 1 tab by mouth every day 8)  Triamcinolone Acetonide 0.5 % Crea (Triamcinolone acetonide) .... Apply bid to affected area 9)  Ceftin 250 Mg Tabs (Cefuroxime axetil) .Marland Kitchen.. 1 by mouth two times a day for bladder infection  Patient Instructions: 1)  Please schedule a follow-up appointment in 4 months. 2)  BMP prior to visit, ICD-9: 3)  Hepatic Panel prior to visit, ICD-9: 4)  HbgA1C prior to visit, ICD-9: 995.20 250.00 5)  Try to eat more raw plant food, fresh and dry fruit, raw almonds, leafy vegetables, whole foods and less red meat, less animal fat. Poultry and fish is better for you than pork and beef. Avoid processed foods (canned soups, hot dogs, sausage, bacon , frozen dinners). Avoid corn syrup, high fructose syrup or aspartam and Splenda  containing drinks. Honey, Agave and Stevia are better sweeteners. Make your own  dressing with olive oil, wine vinegar, lemon juce, garlic etc. for your salads.  Prescriptions: CEFTIN 250 MG TABS (CEFUROXIME AXETIL) 1 by mouth two times a day for bladder infection  #10 x 2   Entered and Authorized by:   Cassandria Anger MD   Signed by:   Cassandria Anger  MD on 12/21/2009   Method used:   Print then Give to Patient   RxID:   [id number] PROVENTIL HFA 108 (90 BASE) MCG/ACT  AERS (ALBUTEROL SULFATE) as needed  #3 x 3   Entered and Authorized by:   Cassandria Anger MD   Signed by:   Cassandria Anger MD on 12/19/2009   Method used:   Print then Give to Patient   RxID:   [id number] ADVAIR DISKUS 250-50 MCG/DOSE  MISC (FLUTICASONE-SALMETEROL) as needed  #3 x 3   Entered and Authorized by:   Cassandria Anger MD   Signed by:   Cassandria Anger MD on 12/19/2009   Method used:   Print then Give to Patient   RxID:   [id number] EXFORGE HCT 10-320-25 MG TABS (AMLODIPINE-VALSARTAN-HCTZ) 1 by mouth qd  #90 x 3   Entered and Authorized by:   Cassandria Anger MD   Signed by:   Cassandria Anger MD on 12/19/2009   Method used:   Print then Give to Patient   RxID:   [id number] ALDACTONE 100 MG TABS (SPIRONOLACTONE) 1 by mouth qd  #90 x 3   Entered and Authorized by:   Cassandria Anger MD   Signed by:   Cassandria Anger MD on 12/19/2009  Method used:   Print then Give to Patient   RxID:   [id number]

## 2010-12-03 NOTE — Assessment & Plan Note (Signed)
Summary: [address] /NWS  #   Vital Signs:  Patient profile:   70 year old female Height:      64 inches Weight:      227 pounds BMI:     39.11 Temp:     97.5 degrees F oral Pulse rate:   80 / minute Pulse rhythm:   regular Resp:     16 per minute BP sitting:   156 / 90  (left arm) Cuff size:   large  Vitals Entered By: Jonathon Resides, CMA(AAMA) (October 07, 2010 1:40 PM)  Procedure Note  Injections: The patient complains of pain and inflammation. Indication: chronic pain Consent signed: yes  Procedure # 1: joint injection    Region: lateral    Location: L hip    Technique: 25 g needle    Medication: 80 mg depomedrol    Comment: Risks including but not limited by incomplete procedure, bleeding, infection, recurrence were discussed with the patient. Consent form was signed. Tolerated well. Complicatons - none. Good pain relief following the procedure.   Cleaned and prepped with: alcohol and betadine Wound dressing: bandaid  CC: 6 wk f/u  Is Patient Diabetic? Yes Comments pt needs Rf on Exforge, Advair and Proventil   Primary Care Provider:  Cassandria Anger MD  CC:  6 wk f/u .  History of Present Illness: The patient presents for a follow up of hypertension, stress, hyperlipidemia . She ran out of Exforge HCT - too $$  Current Medications (verified): 1)  Exforge Hct 10-320-25 Mg Tabs (Amlodipine-Valsartan-Hctz) .Marland Kitchen.. 1 By Mouth Qd 2)  Aldactone 100 Mg Tabs (Spironolactone) .Marland Kitchen.. 1 By Mouth Qd 3)  Advair Diskus 250-50 Mcg/dose  Misc (Fluticasone-Salmeterol) .... As Needed 4)  Proventil Hfa 108 (90 Base) Mcg/act  Aers (Albuterol Sulfate) .... As Needed 5)  Triamcinolone Acetonide 0.5 % Crea (Triamcinolone Acetonide) .... Apply Bid To Affected Area 6)  Loratadine 10 Mg Tabs (Loratadine) .Marland Kitchen.. 1 By Mouth Once Daily As Needed Allergies 7)  Alprazolam 0.5 Mg Tabs (Alprazolam) .Marland Kitchen.. 1 By Mouth Two Times A Day As Needed Anxiety 8)  Aspirin 325 Mg Tabs (Aspirin) .Marland Kitchen.. 1 By  Mouth Once Daily Pc 9)  Vitamin D3 1000 Unit  Tabs (Cholecalciferol) .... 2 Once Daily 10)  Crestor 10 Mg Tabs (Rosuvastatin Calcium) .Marland Kitchen.. 1 By Mouth At Bedtime 11)  Promethazine Hcl 25 Mg Tabs (Promethazine Hcl) .Marland Kitchen.. 1 By Mouth Every 4 Hours As Needed For Nausea and Vomitting - May Cause Sedation  Allergies (verified): 1)  Lipitor  Past History:  Past Medical History: Last updated: 08/26/2010 Asthma  Breast cancer, hx of  Hypertension Diabetes mellitus, type II R thalamic CVA 01/2010 - Cerebrovascular accident, hx of Hyperlipidemia/ dyslipidemia TIA x 2 2011  Past Surgical History: Last updated: 08/16/2008 Lumpectomy 2002 R  Family History: Last updated: 10/03/2007 Family History Hypertension  Social History: Last updated: 06/09/2010 Occupation: Network engineer at [employer]  The patient denies fever, chest pain, syncope, abdominal pain, and melena.         No TIA symptoms   Physical Exam  General:  overweight-appearing.  alert, well-developed, well-nourished, and cooperative to examination.    Ears:  External ear exam shows no significant lesions or deformities.  Otoscopic examination reveals clear canals, tympanic membranes are intact bilaterally without bulging, retraction, inflammation or discharge. Hearing is grossly normal bilaterally. Nose:  External nasal examination shows no deformity or inflammation. Nasal mucosa are pink and moist  without lesions or exudates. Mouth:  teeth and gums in good repair; mucous membranes moist, without lesions or ulcers. oropharynx clear without exudate, no erythema.  Lungs:  normal respiratory effort, no intercostal retractions or use of accessory muscles; normal breath sounds bilaterally - no crackles and no wheezes.    Heart:  normal rate, regular rhythm, no murmur, and no rub. BLE without edema. Abdomen:  soft, non-tender, normal bowel sounds, no distention; no masses and no appreciable hepatomegaly  or splenomegaly.   Msk:  L hip is painful Extremities:  no edema B Neurologic:  alert & oriented X3 and cranial nerves II-XII symetrically intact.  strength normal in all extremities, sensation intact to light touch, and gait normal. speech fluent without dysarthria or aphasia; follows commands with good comprehension.  Skin:  Erythem. patches w/vesiles 1.4x .6 mm on B sides of her face x 3 Cervical Nodes:  No lymphadenopathy noted Psych:  anxious   Impression & Recommendations:  Problem # 1:  HIP PAIN (ICD-719.45) L troch bursitis Assessment New  Her updated medication list for this problem includes:    Aspirin 325 Mg Tabs (Aspirin) .Marland Kitchen... 1 by mouth once daily pc  Orders: Joint Aspirate / Injection, Large (20610) Depo- Medrol 80mg  (J1040)  Problem # 2:  HYPERLIPIDEMIA (ICD-272.4) Assessment: Improved  Her updated medication list for this problem includes:    Crestor 10 Mg Tabs (Rosuvastatin calcium) .Marland Kitchen... 1 by mouth at bedtime  Problem # 3:  DIABETES MELLITUS, TYPE II (ICD-250.00) Assessment: Unchanged  Her updated medication list for this problem includes:    Exforge Hct 10-320-25 Mg Tabs (Amlodipine-valsartan-hctz) .Marland Kitchen... 1 by mouth qd    Aspirin 325 Mg Tabs (Aspirin) .Marland Kitchen... 1 by mouth once daily pc  Problem # 4:  HYPERTENSION (ICD-401.9) Assessment: Deteriorated Risks of noncompliance with treatment discussed. Compliance encouraged.  Her updated medication list for this problem includes:    Exforge Hct 10-320-25 Mg Tabs (Amlodipine-valsartan-hctz) .Marland Kitchen... 1 by mouth once daily -  restart    Aldactone 100 Mg Tabs (Spironolactone) .Marland Kitchen... 1 by mouth qd  Complete Medication List: 1)  Exforge Hct 10-320-25 Mg Tabs (Amlodipine-valsartan-hctz) .Marland Kitchen.. 1 by mouth qd 2)  Aldactone 100 Mg Tabs (Spironolactone) .Marland Kitchen.. 1 by mouth qd 3)  Advair Diskus 250-50 Mcg/dose Misc (Fluticasone-salmeterol) .... As needed 4)  Triamcinolone Acetonide 0.5 % Crea (Triamcinolone acetonide) .... Apply bid  to affected area 5)  Loratadine 10 Mg Tabs (Loratadine) .Marland Kitchen.. 1 by mouth once daily as needed allergies 6)  Alprazolam 0.5 Mg Tabs (Alprazolam) .Marland Kitchen.. 1 by mouth two times a day as needed anxiety 7)  Aspirin 325 Mg Tabs (Aspirin) .Marland Kitchen.. 1 by mouth once daily pc 8)  Vitamin D3 1000 Unit Tabs (Cholecalciferol) .... 2 once daily 9)  Crestor 10 Mg Tabs (Rosuvastatin calcium) .Marland Kitchen.. 1 by mouth at bedtime 10)  Promethazine Hcl 25 Mg Tabs (Promethazine hcl) .Marland Kitchen.. 1 by mouth every 4 hours as needed for nausea and vomitting - may cause sedation 11)  Ventolin Hfa 108 (90 Base) Mcg/act Aers (Albuterol sulfate) .... 2 inh up to qid as needed wheezing or shortness of breath  Patient Instructions: 1)  Please schedule a follow-up appointment in 3 months. 2)  BMP prior to visit, ICD-9: 3)  Hepatic Panel prior to visit, ICD-9: 4)  Lipid Panel prior to visit, ICD-9: 5)  HbgA1C prior to visit, ICD-9: 250.00 401.1 Prescriptions: VENTOLIN HFA 108 (90 BASE) MCG/ACT AERS (ALBUTEROL SULFATE) 2 inh up to qid as needed wheezing or shortness of  breath  #1 x 12   Entered and Authorized by:   Cassandria Anger MD   Signed by:   Cassandria Anger MD on 10/07/2010   Method used:   Electronically to        [facility] [address] [phone]* (retail)       [place] [address]       [place], [place]  [address]       Ph: [phone] or [phone]       Fax: [phone]   RxID:   [id number] ADVAIR DISKUS 250-50 MCG/DOSE  MISC (FLUTICASONE-SALMETEROL) as needed  #3 x 3   Entered and Authorized by:   Cassandria Anger MD   Signed by:   Cassandria Anger MD on 10/07/2010   Method used:   [facility]ronically to        [facility] [address] [phone]* (retail)       [place] [address]       [place],   [address]       Ph: [phone] or [phone]       Fax: [phone]   RxID:   [id number] ALPRAZOLAM 0.5 MG TABS (ALPRAZOLAM) 1 by mouth two times a day as needed anxiety  #60 x  1   Entered and Authorized by:   Cassandria Anger MD   Signed by:   Cassandria Anger MD on 10/07/2010   Method used:   Print then Give to Patient   RxID:   [id number] CRESTOR 10 MG TABS (ROSUVASTATIN CALCIUM) 1 by mouth at bedtime  #30 x 11   Entered and Authorized by:   Cassandria Anger MD   Signed by:   Cassandria Anger MD on 10/07/2010   Method used:   Print then Give to Patient   RxID:   [id number] ALDACTONE 100 MG TABS (SPIRONOLACTONE) 1 by mouth qd  #30 x 11   Entered and Authorized by:   Cassandria Anger MD   Signed by:   Cassandria Anger MD on 10/07/2010   Method used:   Print then Give to Patient   RxID:   [id number] EXFORGE HCT 10-320-25 MG TABS (AMLODIPINE-VALSARTAN-HCTZ) 1 by mouth qd  #30 x 11   Entered and Authorized by:   Cassandria Anger MD   Signed by:   Cassandria Anger MD on 10/07/2010   Method used:   Print then Give to Patient   RxID:   [id number] VENTOLIN HFA 108 (90 BASE) MCG/ACT AERS (ALBUTEROL SULFATE) 2 inh up to qid as needed wheezing or shortness of breath  #1 x 12   Entered and Authorized by:   Cassandria Anger MD   Signed by:   Cassandria Anger MD on 10/07/2010   Method used:   Print then Give to Patient   RxID:   [id number]    Orders Added: 1)  Est. Patient Level IV [id number] 2)  Joint Aspirate / Injection, Large [id number] 3)  Depo- Medrol 80mg  [id number]

## 2010-12-03 NOTE — Assessment & Plan Note (Signed)
Summary: 3MTH FU  STC   RS'D PER PT/NWS   Vital Signs:  Patient profile:   70 year old female Height:      64 inches Weight:      220 pounds BMI:     37.90 Temp:     97.4 degrees F oral Pulse rate:   92 / minute Pulse rhythm:   regular Resp:     16 per minute BP sitting:   110 / 58  (left arm) Cuff size:   large  Vitals Entered By: Jonathon Resides, CMA(AAMA) (June 09, 2010 1:35 PM) CC: 3 mo f/u Is Patient Diabetic? No   CC:  3 mo f/u.  History of Present Illness: C/o numbness of R face numbness; R hand weakness x 20 h on Friday. She did not take Aggrenox due to cost. Lipitor made her tired. She restarted 1 baby asa a day. C/o stress w/her autistic son  Preventive Screening-Counseling & Management  Alcohol-Tobacco     Smoking Cessation Counseling: yes  Current Medications (verified): 1)  Exforge Hct 10-320-25 Mg Tabs (Amlodipine-Valsartan-Hctz) .Marland Kitchen.. 1 By Mouth Qd 2)  Aldactone 100 Mg Tabs (Spironolactone) .Marland Kitchen.. 1 By Mouth Qd 3)  Advair Diskus 250-50 Mcg/dose  Misc (Fluticasone-Salmeterol) .... As Needed 4)  Proventil Hfa 108 (90 Base) Mcg/act  Aers (Albuterol Sulfate) .... As Needed 5)  Vitamin D3 1000 Unit  Tabs (Cholecalciferol) .... 2 Once Daily 6)  Triamcinolone Acetonide 0.5 % Crea (Triamcinolone Acetonide) .... Apply Bid To Affected Area 7)  Aggrenox 25-200 Mg Xr12h-Cap (Aspirin-Dipyridamole) .Marland Kitchen.. 1 By Mouth Bid 8)  Lipitor 10 Mg Tabs (Atorvastatin Calcium) .Marland Kitchen.. 1 By Mouth Once Daily For Cholesterol 9)  Loratadine 10 Mg Tabs (Loratadine) .Marland Kitchen.. 1 By Mouth Once Daily As Needed Allergies 10)  Alprazolam 0.5 Mg Tabs (Alprazolam) .Marland Kitchen.. 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         1 ppd smoker  Physical Exam  General:  overweight-appearing.   Head:  Normocephalic and atraumatic without obvious abnormalities. No apparent alopecia or balding. Eyes:  No corneal or conjunctival inflammation noted. EOMI. Perrla.  Ears:  External ear exam shows no significant lesions or deformities.  Otoscopic examination reveals clear canals, tympanic membranes are intact bilaterally without bulging, retraction, inflammation or discharge. Hearing is grossly normal bilaterally. Nose:  External nasal examination shows no deformity or inflammation. Nasal mucosa are pink and moist without lesions or exudates. Mouth:  Erythematous throat mucosa and intranasal erythema.  Neck:  No deformities, masses, or tenderness noted. Chest Wall:  No deformities, masses, or tenderness noted. Lungs:  Normal respiratory effort, chest expands symmetrically. Lungs are clear to auscultation, no crackles or wheezes. Heart:  Normal rate and regular rhythm. S1 and S2 normal without gallop, murmur, click, rub or other extra sounds. Abdomen:  Bowel sounds positive,abdomen soft and non-tender without masses, organomegaly or hernias noted. Msk:  No deformity  or scoliosis noted of thoracic or lumbar spine.   Pulses:  symmetric Extremities:  no edema B Neurologic:  alert & oriented X3.   Skin:  Erythem. patches w/vesiles 1.4x .6 mm on B sides of her face x 3 Cervical Nodes:  No lymphadenopathy noted Inguinal Nodes:  No significant adenopathy Psych:  Cognition and judgment appear intact. Alert and  cooperative with normal attention span and concentration. No apparent delusions, illusions, hallucinations   Impression & Recommendations:  Problem # 1:  CEREBROVASCULAR ACCIDENT, HX OF (ICD-V12.50) Assessment Deteriorated Stop smoking!!!! ASA 325 mg once daily pc Risks of noncompliance with treatment discussed. Compliance encouraged.  Take time off  Problem # 2:  HYPERLIPIDEMIA (B2193296.4) Assessment: Unchanged  Her updated medication list for this problem includes:    Lipitor 10 Mg Tabs (Atorvastatin calcium) .Marland Kitchen... 1 by mouth once daily for cholesterol    Lovastatin 20 Mg Tabs (Lovastatin) .Marland Kitchen... 1 by mouth once daily for cholesterol had to stop  Problem # 3:  DIABETES MELLITUS, TYPE II (ICD-250.00) Assessment: Unchanged  Her updated medication list for this problem includes:    Exforge Hct 10-320-25 Mg Tabs (Amlodipine-valsartan-hctz) .Marland Kitchen... 1 by mouth qd    Aspirin 325 Mg Tabs (Aspirin) .Marland Kitchen... 1 by mouth once daily pc  Problem # 4:  FATIGUE (ICD-780.79) Assessment: Unchanged  Problem # 5:  TOBACCO USER (ICD-305.1) Assessment: Unchanged  Encouraged smoking cessation and discussed different methods for smoking cessation.   Problem # 6:  VITAMIN D DEFICIENCY (ICD-268.9) Assessment: Improved On the regimen of medicine(s) reflected in the chart    Problem # 7:  ASTHMA (ICD-493.90) Assessment: Improved  Her updated medication list for this problem includes:    Advair Diskus 250-50 Mcg/dose Misc (Fluticasone-salmeterol) .Marland Kitchen... As needed    Proventil Hfa 108 (90 Base) Mcg/act Aers (Albuterol sulfate) .Marland Kitchen... As needed  Complete Medication List: 1)  Exforge Hct 10-320-25 Mg Tabs (Amlodipine-valsartan-hctz) .Marland Kitchen.. 1 by mouth qd 2)  Aldactone 100 Mg Tabs (Spironolactone) .Marland Kitchen.. 1 by mouth qd 3)  Advair Diskus 250-50 Mcg/dose Misc (Fluticasone-salmeterol) .... As needed 4)  Proventil Hfa 108 (90 Base) Mcg/act Aers (Albuterol sulfate) .... As needed 5)  Triamcinolone Acetonide 0.5 %  Crea (Triamcinolone acetonide) .... Apply bid to affected area 6)  Lipitor 10 Mg Tabs (Atorvastatin calcium) .Marland Kitchen.. 1 by mouth once daily for cholesterol 7)  Loratadine 10 Mg Tabs (Loratadine) .Marland Kitchen.. 1 by mouth once daily as needed allergies 8)  Alprazolam 0.5 Mg Tabs (Alprazolam) .Marland Kitchen.. 1 by mouth two times a day as needed anxiety 9)  Aspirin 325 Mg Tabs (Aspirin) .Marland Kitchen.. 1 by mouth once daily pc 10)  Vitamin D3 1000 Unit Tabs (Cholecalciferol) .... 2 once daily 11)  Lovastatin 20 Mg Tabs (Lovastatin) .Marland Kitchen.. 1 by mouth once daily for cholesterol  Patient Instructions: 1)  Please schedule a follow-up appointment in 6 weeks. 2)  BMP prior to visit, ICD-9: 3)  Hepatic Panel prior to visit, ICD-9: 436  401.0 4)  Lipid Panel prior to visit, ICD-9: 5)  TSH prior to visit, ICD-9: 6)  CBC w/ Diff prior to visit, ICD-9: 7)  HbgA1C prior to visit, ICD-9: 8)  Tobacco is very bad for your health and your loved ones! You Should stop smoking!. Prescriptions: PROVENTIL HFA 108 (90 BASE) MCG/ACT  AERS (ALBUTEROL SULFATE) as needed  #3 x 3   Entered and Authorized by:   Cassandria Anger MD   Signed by:   Cassandria Anger MD on 06/09/2010   Method used:  Print then Give to Patient   RxID:   [id number] EXFORGE HCT 10-320-25 MG TABS (AMLODIPINE-VALSARTAN-HCTZ) 1 by mouth qd  #90 x 3   Entered and Authorized by:   Cassandria Anger MD   Signed by:   Cassandria Anger MD on 06/09/2010   Method used:   Print then Give to Patient   RxID:   [id number] LOVASTATIN 20 MG TABS (LOVASTATIN) 1 by mouth once daily for cholesterol  #30 x 12   Entered and Authorized by:   Cassandria Anger MD   Signed by:   Cassandria Anger MD on 06/09/2010   Method used:   Print then Give to Patient   RxID:   [id number]

## 2010-12-03 NOTE — Assessment & Plan Note (Signed)
Summary: leg pain/SD   Vital Signs:  Patient profile:   70 year old female Height:      64 inches Weight:      227 pounds BMI:     39.11 Temp:     98.1 degrees F oral Pulse rate:   88 / minute Pulse rhythm:   regular Resp:     16 per minute BP sitting:   120 / 60  (left arm) Cuff size:   large  Vitals Entered By: Jonathon Resides, CMA(AAMA) (October 21, 2010 1:35 PM) CC: Lt knee/leg pain X 1 wk Is Patient Diabetic? Yes   Primary Care Yitzchok Carriger:  Cassandria Anger MD  CC:  Lt knee/leg pain X 1 wk.  History of Present Illness: C/o modeate to severe LBP on L and L leg pain down to the foot - worse. Hip pain is better. No weakness.  Current Medications (verified): 1)  Exforge Hct 10-320-25 Mg Tabs (Amlodipine-Valsartan-Hctz) .Marland Kitchen.. 1 By Mouth Qd 2)  Aldactone 100 Mg Tabs (Spironolactone) .Marland Kitchen.. 1 By Mouth Qd 3)  Advair Diskus 250-50 Mcg/dose  Misc (Fluticasone-Salmeterol) .... As Needed 4)  Triamcinolone Acetonide 0.5 % Crea (Triamcinolone Acetonide) .... Apply Bid To Affected Area 5)  Loratadine 10 Mg Tabs (Loratadine) .Marland Kitchen.. 1 By Mouth Once Daily As Needed Allergies 6)  Alprazolam 0.5 Mg Tabs (Alprazolam) .Marland Kitchen.. 1 By Mouth Two Times A Day As Needed Anxiety 7)  Aspirin 325 Mg Tabs (Aspirin) .Marland Kitchen.. 1 By Mouth Once Daily Pc 8)  Vitamin D3 1000 Unit  Tabs (Cholecalciferol) .... 2 Once Daily 9)  Crestor 10 Mg Tabs (Rosuvastatin Calcium) .Marland Kitchen.. 1 By Mouth At Bedtime 10)  Promethazine Hcl 25 Mg Tabs (Promethazine Hcl) .Marland Kitchen.. 1 By Mouth Every 4 Hours As Needed For Nausea and Vomitting - May Cause Sedation 11)  Ventolin Hfa 108 (90 Base) Mcg/act Aers (Albuterol Sulfate) .... 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    Physical Exam  General:  overweight-appearing.  alert, well-developed, well-nourished, and cooperative to examination.    Mouth:  teeth and gums in good repair; mucous membranes moist, without lesions or ulcers. oropharynx clear without exudate, no erythema.  Lungs:  normal respiratory effort, no intercostal retractions or use of accessory muscles; normal breath sounds bilaterally - no crackles and no wheezes.    Heart:  normal rate, regular rhythm, no murmur, and no rub. BLE without edema. Abdomen:  soft, non-tender, normal bowel sounds, no distention; no masses and no appreciable hepatomegaly or splenomegaly.   Msk:  Lumbar-sacral spine is tender on L to palpation over paraspinal muscles and painfull with the ROM   Neurologic:  alert & oriented X3 Limp Str leg elev pos on L Skin:  Erythem. patches w/vesiles 1.4x .6 mm on B sides of her face x 3 Psych:  anxious   Impression & Recommendations:  Problem # 1:  LEG PAIN (ICD-729.5) L - possible radiculpathy Assessment New MRI if not better  Problem # 2:  LOW BACK PAIN, ACUTE (ICD-724.2) L Assessment: New  Her updated medication list for  this problem includes:    Aspirin 325 Mg Tabs (Aspirin) .Marland Kitchen... 1 by mouth once daily pc    Naproxen 500 Mg Tabs (Naproxen) .Marland Kitchen... 1 by mouth two times a day pc for pain/arthritis    Hydrocodone-acetaminophen 5-325 Mg Tabs (Hydrocodone-acetaminophen) .Marland Kitchen... 1-2 by mouth two times a day as needed pain  Orders: T-Lumbar Spine 2 Views (72100TC)  Problem # 3:  HIP PAIN (ICD-719.45) L - better Assessment: Improved  Her updated medication list for this problem includes:    Aspirin 325 Mg Tabs (Aspirin) .Marland Kitchen... 1 by mouth once daily pc    Naproxen 500 Mg Tabs (Naproxen) .Marland Kitchen... 1 by mouth two times a day pc for  pain/arthritis    Hydrocodone-acetaminophen 5-325 Mg Tabs (Hydrocodone-acetaminophen) .Marland Kitchen... 1-2 by mouth two times a day as needed pain  Complete Medication List: 1)  Exforge Hct 10-320-25 Mg Tabs (Amlodipine-valsartan-hctz) .Marland Kitchen.. 1 by mouth qd 2)  Aldactone 100 Mg Tabs (Spironolactone) .Marland Kitchen.. 1 by mouth qd 3)  Advair Diskus 250-50 Mcg/dose Misc (Fluticasone-salmeterol) .... As needed 4)  Triamcinolone Acetonide 0.5 % Crea (Triamcinolone acetonide) .... Apply bid to affected area 5)  Loratadine 10 Mg Tabs (Loratadine) .Marland Kitchen.. 1 by mouth once daily as needed allergies 6)  Alprazolam 0.5 Mg Tabs (Alprazolam) .Marland Kitchen.. 1 by mouth two times a day as needed anxiety 7)  Aspirin 325 Mg Tabs (Aspirin) .Marland Kitchen.. 1 by mouth once daily pc 8)  Vitamin D3 1000 Unit Tabs (Cholecalciferol) .... 2 once daily 9)  Crestor 10 Mg Tabs (Rosuvastatin calcium) .Marland Kitchen.. 1 by mouth at bedtime 10)  Promethazine Hcl 25 Mg Tabs (Promethazine hcl) .Marland Kitchen.. 1 by mouth every 4 hours as needed for nausea and vomitting - may cause sedation 11)  Ventolin Hfa 108 (90 Base) Mcg/act Aers (Albuterol sulfate) .... 2 inh up to qid as needed wheezing or shortness of breath 12)  Naproxen 500 Mg Tabs (Naproxen) .Marland Kitchen.. 1 by mouth two times a day pc for pain/arthritis 13)  Omeprazole 40 Mg Cpdr (Omeprazole) .Marland Kitchen.. 1 by mouth qam for indigestion - take with naproxen 14)  Hydrocodone-acetaminophen 5-325 Mg Tabs (Hydrocodone-acetaminophen) .Marland Kitchen.. 1-2 by mouth two times a day as needed pain  Patient Instructions: 1)  Call if you are not better in a reasonable amount of time or if worse.  2)  Please schedule a follow-up appointment in 2 weeks. Prescriptions: HYDROCODONE-ACETAMINOPHEN 5-325 MG TABS (HYDROCODONE-ACETAMINOPHEN) 1-2 by mouth two times a day as needed pain  #100 x 0   Entered and Authorized by:   Cassandria Anger MD   Signed by:   Cassandria Anger MD on 10/21/2010   Method used:   Print then Give to Patient   RxID:   [id number] OMEPRAZOLE 40  MG CPDR (OMEPRAZOLE) 1 by mouth qam for indigestion - take with Naproxen  #30 x 3   Entered and Authorized by:   Cassandria Anger MD   Signed by:   Cassandria Anger MD on 10/21/2010   Method used:   Print then Give to Patient   RxID:   [id number] NAPROXEN 500 MG TABS (NAPROXEN) 1 by mouth two times a day pc for pain/arthritis  #60 x 3   Entered and Authorized by:   Cassandria Anger MD   Signed by:   Cassandria Anger MD on 10/21/2010   Method used:   Print then Give to Patient   RxID:   [id number]    Orders Added: 1)  Est. Patient Level IV [id number] 2)  T-Lumbar  Spine 2 Views [72100TC]

## 2010-12-03 NOTE — Letter (Signed)
Summary: Out of Work  [facility] [place], [place] [address]   Phone: [phone]  Fax: [phone]    August 26, 2010   Employee:  ZORAIDA BATTISTONI    To Whom It May Concern:   For Medical reasons, please excuse the above named employee from work for the following dates:  Start: 08/26/10    End: 08/27/10, May return to work 08/28/10    If you need additional information, please feel free to contact our office.         Sincerely,    Dr. Gwendolyn Grant

## 2010-12-03 NOTE — Assessment & Plan Note (Signed)
Summary: 2 WK FU  STC   Vital Signs:  Patient profile:   70 year old female Height:      64 inches Weight:      226 pounds BMI:     38.93 Pulse rate:   80 / minute Pulse rhythm:   regular Resp:     16 per minute BP sitting:   120 / 70  (left arm) Cuff size:   large  Vitals Entered By: Jonathon Resides, CMA(AAMA) (November 03, 2010 1:12 PM) CC: 2 wk f/u  Is Patient Diabetic? Yes   Primary Care Provider:  Cassandria Anger MD  CC:  2 wk f/u .  History of Present Illness: F/u LBP is better; no pain in L leg x 2 d F/u HTN, DM and anxiety  Current Medications (verified): 1)  Exforge Hct 10-320-25 Mg Tabs (Amlodipine-Valsartan-Hctz) .Marland Kitchen.. 1 By Mouth Qd 2)  Aldactone 100 Mg Tabs (Spironolactone) .Marland Kitchen.. 1 By Mouth Qd 3)  Advair Diskus 250-50 Mcg/dose  Misc (Fluticasone-Salmeterol) .... As Needed 4)  Triamcinolone Acetonide 0.5 % Crea (Triamcinolone Acetonide) .... Apply Bid To Affected Area 5)  Loratadine 10 Mg Tabs (Loratadine) .Marland Kitchen.. 1 By Mouth Once Daily As Needed Allergies 6)  Alprazolam 0.5 Mg Tabs (Alprazolam) .Marland Kitchen.. 1 By Mouth Two Times A Day As Needed Anxiety 7)  Aspirin 325 Mg Tabs (Aspirin) .Marland Kitchen.. 1 By Mouth Once Daily Pc 8)  Vitamin D3 1000 Unit  Tabs (Cholecalciferol) .... 2 Once Daily 9)  Crestor 10 Mg Tabs (Rosuvastatin Calcium) .Marland Kitchen.. 1 By Mouth At Bedtime 10)  Promethazine Hcl 25 Mg Tabs (Promethazine Hcl) .Marland Kitchen.. 1 By Mouth Every 4 Hours As Needed For Nausea and Vomitting - May Cause Sedation 11)  Ventolin Hfa 108 (90 Base) Mcg/act Aers (Albuterol Sulfate) .... 2 Inh Up To Qid As Needed Wheezing or Shortness of Breath 12)  Naproxen 500 Mg Tabs (Naproxen) .Marland Kitchen.. 1 By Mouth Two Times A Day Pc For Pain/arthritis 13)  Omeprazole 40 Mg Cpdr (Omeprazole) .Marland Kitchen.. 1 By Mouth Qam For Indigestion - Take With Naproxen 14)  Hydrocodone-Acetaminophen 5-325 Mg Tabs (Hydrocodone-Acetaminophen) .Marland Kitchen.. 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  The patient denies fever and dyspnea on exertion.         LBP  Physical Exam  General:  overweight-appearing.  alert, well-developed, well-nourished, and cooperative to examination.    Mouth:  teeth and gums in good repair; mucous membranes moist, without lesions or ulcers. oropharynx clear without exudate, no erythema.  Lungs:  normal respiratory effort, no intercostal retractions or use of accessory muscles; normal breath sounds bilaterally - no crackles and no wheezes.    Heart:  normal rate, regular rhythm, no murmur, and no rub. BLE without edema. Abdomen:  soft, non-tender, normal bowel sounds, no distention; no masses and no appreciable hepatomegaly or splenomegaly.   Msk:  Lumbar-sacral spine is less  tender on L to palpation over paraspinal muscles and less  painfull with the ROM   Neurologic:  alert & oriented X3 Less limp Str leg elev is neg  on L Skin:  WNL Psych:  anxious   Impression & Recommendations:  Problem # 1:  LOW BACK PAIN (ICD-724.2) Assessment Improved Ortho ref, MRI discussed - will get if needed. 55-60% better Her updated medication list for this problem includes:    Aspirin 325 Mg Tabs (Aspirin) .Marland Kitchen... 1 by mouth once daily pc    Naproxen 500 Mg Tabs (Naproxen) .Marland Kitchen... 1 by mouth two times a day pc for pain/arthritis    Hydrocodone-acetaminophen 5-325 Mg Tabs (Hydrocodone-acetaminophen) .Marland Kitchen... 1-2 by mouth two times a day as needed pain  Orders: Physical Therapy Referral  (PT)  Problem # 2:  LEG PAIN (ICD-729.5) L - resolved Assessment: Improved  Orders: Physical Therapy Referral (PT)  Problem # 3:  HYPERLIPIDEMIA (P102836.4) Assessment: Improved  Her updated medication list for this problem includes:    Crestor 10 Mg Tabs (Rosuvastatin calcium) .Marland Kitchen... 1 by mouth at bedtime  Problem # 4:  DIABETES MELLITUS, TYPE II (ICD-250.00) Assessment: Unchanged  Her updated medication list for this problem includes:    Exforge Hct 10-320-25 Mg Tabs (Amlodipine-valsartan-hctz) .Marland Kitchen... 1 by mouth qd    Aspirin 325 Mg Tabs (Aspirin) .Marland Kitchen... 1 by mouth once daily pc  Complete Medication List: 1)  Exforge Hct 10-320-25 Mg Tabs (Amlodipine-valsartan-hctz) .Marland Kitchen.. 1 by mouth qd 2)  Aldactone 100 Mg Tabs (Spironolactone) .Marland Kitchen.. 1 by mouth qd 3)  Advair Diskus 250-50 Mcg/dose Misc (Fluticasone-salmeterol) .... As needed 4)  Triamcinolone Acetonide 0.5 % Crea (Triamcinolone acetonide) .... Apply bid to affected area 5)  Loratadine 10 Mg Tabs (Loratadine) .Marland Kitchen.. 1 by mouth once daily as needed allergies 6)  Alprazolam 0.5 Mg Tabs (Alprazolam) .Marland Kitchen.. 1 by mouth two times a day as needed anxiety 7)  Aspirin 325 Mg Tabs (Aspirin) .Marland Kitchen.. 1 by mouth once daily pc 8)  Vitamin D3 1000 Unit Tabs (Cholecalciferol) .... 2 once daily 9)  Crestor 10 Mg Tabs (Rosuvastatin calcium) .Marland Kitchen.. 1 by mouth at bedtime 10)  Promethazine Hcl 25 Mg Tabs (Promethazine hcl) .Marland Kitchen.. 1 by mouth every 4 hours as needed for nausea and vomitting - may cause sedation 11)  Ventolin Hfa 108 (90 Base) Mcg/act Aers (Albuterol sulfate) .... 2 inh up to qid as needed wheezing or shortness of breath 12)  Naproxen 500 Mg Tabs (Naproxen) .Marland Kitchen.. 1 by mouth two times a day pc for pain/arthritis 13)  Omeprazole 40 Mg Cpdr (Omeprazole) .Marland Kitchen.. 1 by mouth qam for indigestion - take with naproxen 14)  Hydrocodone-acetaminophen 5-325 Mg Tabs (Hydrocodone-acetaminophen) .Marland Kitchen.. 1-2 by mouth two times a day as needed pain  Patient Instructions: 1)   Please schedule a follow-up appointment in 2 months. 2)  BMP prior to visit, ICD-9: 3)  Hepatic Panel prior to visit, ICD-9:40.1 4)  Lipid Panel prior to visit, ICD-9:272.0 5)  HbgA1C prior to visit, ICD-9: Prescriptions: NAPROXEN 500 MG TABS (NAPROXEN) 1 by mouth two times a day pc for pain/arthritis  #60 x 3   Entered and Authorized by:   Cassandria Anger MD   Signed by:   Cassandria Anger MD on 11/03/2010   Method used:   Print then Give to Patient   RxID:   [id number] OMEPRAZOLE 40 MG CPDR (OMEPRAZOLE) 1 by mouth qam for indigestion - take with Naproxen  #30 x 3   Entered and Authorized by:   Cassandria Anger MD   Signed by:   Cassandria Anger MD on 11/03/2010  Method used:   Print then Give to Patient   RxID:   [id number] VENTOLIN HFA 108 (90 BASE) MCG/ACT AERS (ALBUTEROL SULFATE) 2 inh up to qid as needed wheezing or shortness of breath  #1 x 12   Entered and Authorized by:   Cassandria Anger MD   Signed by:   Cassandria Anger MD on 11/03/2010   Method used:   Print then Give to Patient   RxID:   [id number] CRESTOR 10 MG TABS (ROSUVASTATIN CALCIUM) 1 by mouth at bedtime  #30 x 11   Entered and Authorized by:   Cassandria Anger MD   Signed by:   Cassandria Anger MD on 11/03/2010   Method used:   Print then Give to Patient   RxID:   [id number] LORATADINE 10 MG TABS (LORATADINE) 1 by mouth once daily as needed allergies  #30 x 6   Entered and Authorized by:   Cassandria Anger MD   Signed by:   Cassandria Anger MD on 11/03/2010   Method used:   Print then Give to Patient   RxID:   [id number] TRIAMCINOLONE ACETONIDE 0.5 % CREA (TRIAMCINOLONE ACETONIDE) apply bid to affected area  #45 g x 2   Entered and Authorized by:   Cassandria Anger MD   Signed by:   Cassandria Anger MD on 11/03/2010   Method used:   Print then Give to Patient   RxID:   [id number] ADVAIR DISKUS 250-50 MCG/DOSE  MISC  (FLUTICASONE-SALMETEROL) as needed  #3 x 3   Entered and Authorized by:   Cassandria Anger MD   Signed by:   Cassandria Anger MD on 11/03/2010   Method used:   Print then Give to Patient   RxID:   [id number] ALDACTONE 100 MG TABS (SPIRONOLACTONE) 1 by mouth qd  #30 x 11   Entered and Authorized by:   Cassandria Anger MD   Signed by:   Cassandria Anger MD on 11/03/2010   Method used:   Print then Give to Patient   RxID:   [id number] EXFORGE HCT 10-320-25 MG TABS (AMLODIPINE-VALSARTAN-HCTZ) 1 by mouth qd  #30 x 11   Entered and Authorized by:   Cassandria Anger MD   Signed by:   Cassandria Anger MD on 11/03/2010   Method used:   Print then Give to Patient   RxID:   [id number] ALPRAZOLAM 0.5 MG TABS (ALPRAZOLAM) 1 by mouth two times a day as needed anxiety  #60 x 1   Entered and Authorized by:   Cassandria Anger MD   Signed by:   Cassandria Anger MD on 11/03/2010   Method used:   Print then Give to Patient   RxID:   [id number]    Orders Added: 1)  Physical Therapy Referral [PT] 2)  Est. Patient Level IV [id number]

## 2010-12-03 NOTE — Letter (Signed)
Summary: [facility]   Imported By: Bubba Hales 10/05/2010 12:18:17  _____________________________________________________________________  External Attachment:    Type:   Image     Comment:   External Document

## 2010-12-03 NOTE — Letter (Signed)
Summary: [facility]   Imported By: Phillis Knack 05/06/2010 09:41:06  _____________________________________________________________________  External Attachment:    Type:   Image     Comment:   External Document

## 2010-12-03 NOTE — Progress Notes (Signed)
Summary: LEG PAIN   Phone Note Call from Patient Call back at Home Phone [phone]   Summary of Call: Pt was seen recently & given inj. She now is c/o pain from knee down her leg. Does she need another office visit? OR referral?  Initial call taken by: Charlsie Quest, [place],  October 20, 2010 11:33 AM  Follow-up for Phone Call        ov please Follow-up by: Cassandria Anger MD,  October 20, 2010 5:24 PM  Additional Follow-up for Phone Call Additional follow up Details #1::        Scheduled for tomorrow Additional Follow-up by: Charlsie Quest, CMA,  October 20, 2010 5:38 PM

## 2010-12-03 NOTE — Miscellaneous (Signed)
Summary: LT Hip injection/[facility]  LT Hip injection/[facility]   Imported By: Phillis Knack 10/09/2010 11:53:28  _____________________________________________________________________  External Attachment:    Type:   Image     Comment:   External Document

## 2010-12-03 NOTE — Progress Notes (Signed)
Summary: WORK IN APT  Phone Note Call from Patient Call back at Home Phone [phone]   Summary of Call: Pt was admitted Saturday for a "light" stroke. Patient is requesting apt soon for hospital f/u. She has concerns and questions regarding meds prescribed. Ok for wk in apt this week?  Initial call taken by: Charlsie Quest, [place],  February 16, 2010 9:05 AM  Follow-up for Phone Call        Sure. Thx Follow-up by: Cassandria Anger MD,  February 16, 2010 11:41 AM  Additional Follow-up for Phone Call Additional follow up Details #1::        Appt for 4/20 @2pm ,pt aware. Additional Follow-up by: Denice Paradise,  February 16, 2010 2:46 PM

## 2010-12-03 NOTE — Assessment & Plan Note (Signed)
Summary: work in this wk per phone note/post hosp [place] /cd   Vital Signs:  Patient profile:   70 year old female Height:      64 inches Weight:      217.75 pounds BMI:     37.51 O2 Sat:      95 % on Room air Temp:     98.1 degrees F oral Pulse rate:   94 / minute BP sitting:   116 / 64  (left arm) Cuff size:   large  Vitals Entered By: Ernestene Mention (February 18, 2010 2:21 PM)  O2 Flow:  Room air CC: Hosp f/u--Pt states that she has a mini stoke over the weekend./kb Is Patient Diabetic? Yes Did you bring your meter with you today? No Pain Assessment Patient in pain? no      Comments Patient brought several meds that were given to her, but she has not had them filled at this time.   CC:  Hosp f/u--Pt states that she has a mini stoke over the weekend./kb.  History of Present Illness: The patient presents for a post-hospital visit for  R thal CVA C/o L mouth numb L body 1/2 is warm Not taking ASA  Current Medications (verified): 1)  Exforge Hct 10-320-25 Mg Tabs (Amlodipine-Valsartan-Hctz) .Marland Kitchen.. 1 By Mouth Qd 2)  Aldactone 100 Mg Tabs (Spironolactone) .Marland Kitchen.. 1 By Mouth Qd 3)  Advair Diskus 250-50 Mcg/dose  Misc (Fluticasone-Salmeterol) .... As Needed 4)  Proventil Hfa 108 (90 Base) Mcg/act  Aers (Albuterol Sulfate) .... As Needed 5)  Vitamin D3 1000 Unit  Tabs (Cholecalciferol) .Marland Kitchen.. 1 Qd 6)  Vitamin D 50000 Unit  Caps (Ergocalciferol) .Marland Kitchen.. 1 By Mouth Weekly 7)  Aspirin 325 Mg Tabs (Aspirin) .... Take 1 Tab By Mouth Every Day 8)  Triamcinolone Acetonide 0.5 % Crea (Triamcinolone Acetonide) .... 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   Mouth:  Erythematous throat mucosa and intranasal erythema.  Lungs:  Normal respiratory effort, chest expands symmetrically. Lungs are clear to auscultation, no crackles or wheezes. Heart:  Normal rate and regular rhythm. S1 and S2 normal without gallop, murmur, click, rub or other extra sounds. Abdomen:  Bowel sounds positive,abdomen soft and non-tender without masses, organomegaly or hernias noted. Msk:  No deformity or scoliosis noted of thoracic or lumbar spine.   Skin:  Erythem. patches w/vesiles 1.4x .6 mm on B sides of her face x 3 Psych:  Cognition and judgment appear intact. Alert and cooperative with normal attention span and concentration. No apparent delusions, illusions, hallucinations   Impression & Recommendations:  Problem # 1:  CEREBROVASCULAR ACCIDENT, HX OF (ICD-V12.50) R thalamic Start Aggrenox  Problem # 2:  DIABETES MELLITUS, TYPE II (ICD-250.00)  Her updated medication list for this problem includes:    Exforge Hct 10-320-25 Mg Tabs (Amlodipine-valsartan-hctz) .Marland Kitchen... 1 by mouth qd  Problem # 3:  VITAMIN D DEFICIENCY (ICD-268.9) Assessment: Improved On prescription/OTC  therapy   Problem # 4:  HYPERTENSION (ICD-401.9) Assessment: Improved  Her updated medication list for this problem includes:    Exforge Hct 10-320-25 Mg Tabs (Amlodipine-valsartan-hctz) .Marland Kitchen... 1 by mouth  qd    Aldactone 100 Mg Tabs (Spironolactone) .Marland Kitchen... 1 by mouth qd  BP today: 116/64 Prior BP: 154/70 (12/19/2009)  Labs Reviewed: K+: 4.8 (12/19/2009) Creat: : 1.0 (12/19/2009)   Chol: 174 (12/19/2009)   HDL: 61.20 (12/19/2009)   LDL: 103 (12/19/2009)   TG: 50.0 (12/19/2009)  Complete Medication List: 1)  Exforge Hct 10-320-25 Mg Tabs (Amlodipine-valsartan-hctz) .Marland Kitchen.. 1 by mouth qd 2)  Aldactone 100 Mg Tabs (Spironolactone) .Marland Kitchen.. 1 by mouth qd 3)  Advair Diskus 250-50 Mcg/dose  Misc (Fluticasone-salmeterol) .... As needed 4)  Proventil Hfa 108 (90 Base) Mcg/act Aers (Albuterol sulfate) .... As needed 5)  Vitamin D3 1000 Unit Tabs (Cholecalciferol) .... 2 once daily 6)  Triamcinolone Acetonide 0.5 % Crea (Triamcinolone acetonide) .... Apply bid to affected area 7)  Aggrenox 25-200 Mg Xr12h-cap (Aspirin-dipyridamole) .Marland Kitchen.. 1 by mouth bid 8)  Lipitor 10 Mg Tabs (Atorvastatin calcium) .Marland Kitchen.. 1 by mouth once daily for cholesterol 9)  Loratadine 10 Mg Tabs (Loratadine) .Marland Kitchen.. 1 by mouth once daily as needed allergies 10)  Alprazolam 0.5 Mg Tabs (Alprazolam) .Marland Kitchen.. 1 by mouth two times a day as needed anxiety  Patient Instructions: 1)  Please schedule a follow-up appointment in 3 months. 2)  BMP prior to visit, ICD-9: 3)  Hepatic Panel prior to visit, ICD-9: 4)  Lipid Panel prior to visit, ICD-9: 5)  CBC w/ Diff prior to visit, ICD-9: 6)  HbgA1C prior to visit, ICD-9: 7)  Vit D 268.9  436 250.00 Prescriptions: ALPRAZOLAM 0.5 MG TABS (ALPRAZOLAM) 1 by mouth two times a day as needed anxiety  #60 x 6   Entered and Authorized by:   Cassandria Anger MD   Signed by:   Cassandria Anger MD on 02/18/2010   Method used:   Print then Give to Patient   RxID:   [id number] LORATADINE 10 MG TABS (LORATADINE) 1 by mouth once daily as needed allergies  #30 x 6   Entered and Authorized by:   Cassandria Anger MD   Signed by:   Cassandria Anger MD on 02/18/2010   Method used:   Print then Give to Patient   RxID:   [id number] LIPITOR 10 MG TABS (ATORVASTATIN CALCIUM) 1 by mouth once daily for cholesterol  #90 x 3   Entered and Authorized by:   Cassandria Anger MD   Signed by:   Cassandria Anger MD on 02/18/2010   Method used:   Print then Give to Patient   RxID:   [id number] AGGRENOX 25-200 MG XR12H-CAP (ASPIRIN-DIPYRIDAMOLE) 1 by mouth bid  #180 x 3   Entered and Authorized by:   Cassandria Anger MD   Signed by:   Cassandria Anger MD on  02/18/2010   Method used:   Print then Give to Patient   RxID:   [id number]

## 2010-12-28 ENCOUNTER — Other Ambulatory Visit: Payer: Self-pay

## 2010-12-30 ENCOUNTER — Encounter: Payer: Self-pay | Admitting: Internal Medicine

## 2011-01-01 ENCOUNTER — Other Ambulatory Visit: Payer: Self-pay

## 2011-01-04 ENCOUNTER — Ambulatory Visit: Payer: Self-pay | Admitting: Internal Medicine

## 2011-01-07 NOTE — Miscellaneous (Signed)
Summary[facility]   Imported By: Bubba Hales 01/01/2011 09:52:41  _____________________________________________________________________  External Attachment:    Type:   Image     Comment:   External Document

## 2011-01-08 ENCOUNTER — Encounter: Payer: Self-pay | Admitting: Internal Medicine

## 2011-01-08 ENCOUNTER — Ambulatory Visit (INDEPENDENT_AMBULATORY_CARE_PROVIDER_SITE_OTHER): Payer: Medicare Other | Admitting: Internal Medicine

## 2011-01-08 DIAGNOSIS — I10 Essential (primary) hypertension: Secondary | ICD-10-CM

## 2011-01-08 DIAGNOSIS — E119 Type 2 diabetes mellitus without complications: Secondary | ICD-10-CM

## 2011-01-08 DIAGNOSIS — Z8679 Personal history of other diseases of the circulatory system: Secondary | ICD-10-CM

## 2011-01-12 ENCOUNTER — Ambulatory Visit: Payer: Self-pay | Admitting: Internal Medicine

## 2011-01-12 NOTE — Assessment & Plan Note (Signed)
Summary: 3 MO FU  /NWS #   Vital Signs:  Patient profile:   70 year old female Height:      64 inches Weight:      228 pounds BMI:     39.28 Temp:     98.7 degrees F oral Pulse rate:   80 / minute Pulse rhythm:   regular Resp:     16 per minute BP sitting:   160 / 90  (left arm) Cuff size:   regular  Vitals Entered By: Jonathon Resides, CMA(AAMA) (January 08, 2011 10:42 AM) CC: 2 mo f/u  Is Patient Diabetic? Yes   Primary Care Provider:  Cassandria Anger MD  CC:  2 mo f/u .  History of Present Illness: C/o HTN - ran out of meds - Exforge is too $$$ F/u and elev chol, DM, CVA  Current Medications (verified): 1)  Exforge Hct 10-320-25 Mg Tabs (Amlodipine-Valsartan-Hctz) .Marland Kitchen.. 1 By Mouth Qd 2)  Aldactone 100 Mg Tabs (Spironolactone) .Marland Kitchen.. 1 By Mouth Qd 3)  Advair Diskus 250-50 Mcg/dose  Misc (Fluticasone-Salmeterol) .... As Needed 4)  Triamcinolone Acetonide 0.5 % Crea (Triamcinolone Acetonide) .... Apply Bid To Affected Area 5)  Loratadine 10 Mg Tabs (Loratadine) .Marland Kitchen.. 1 By Mouth Once Daily As Needed Allergies 6)  Alprazolam 0.5 Mg Tabs (Alprazolam) .Marland Kitchen.. 1 By Mouth Two Times A Day As Needed Anxiety 7)  Aspirin 325 Mg Tabs (Aspirin) .Marland Kitchen.. 1 By Mouth Once Daily Pc 8)  Vitamin D3 1000 Unit  Tabs (Cholecalciferol) .... 2 Once Daily 9)  Crestor 10 Mg Tabs (Rosuvastatin Calcium) .Marland Kitchen.. 1 By Mouth At Bedtime 10)  Promethazine Hcl 25 Mg Tabs (Promethazine Hcl) .Marland Kitchen.. 1 By Mouth Every 4 Hours As Needed For Nausea and Vomitting - May Cause Sedation 11)  Ventolin Hfa 108 (90 Base) Mcg/act Aers (Albuterol Sulfate) .... 2 Inh Up To Qid As Needed Wheezing or Shortness of Breath 12)  Naproxen 500 Mg Tabs (Naproxen) .Marland Kitchen.. 1 By Mouth Two Times A Day Pc For Pain/arthritis 13)  Omeprazole 40 Mg Cpdr (Omeprazole) .Marland Kitchen.. 1 By Mouth Qam For Indigestion - Take With Naproxen 14)  Hydrocodone-Acetaminophen 5-325 Mg Tabs (Hydrocodone-Acetaminophen) .Marland Kitchen.. 1-2 By Mouth Two Times A Day As Needed Pain  Allergies  (verified): 1)  Lipitor  Past History:  Past Medical History: Last updated: 11/03/2010 Asthma  Breast cancer, hx of  Hypertension Diabetes mellitus, type II R thalamic CVA 01/2010 - Cerebrovascular accident, hx of Hyperlipidemia/ dyslipidemia TIA x 2 2011 Low back pain Osteoarthritis Anxiety  Social History: Last updated: 06/09/2010 Occupation: Network engineer at [employer] Current Smoker Single  Review of Systems  The patient denies hoarseness, chest pain, dyspnea on exertion, abdominal pain, and melena.    Physical Exam  General:  overweight-appearing.  alert, well-developed, well-nourished, and cooperative to examination.    Nose:  External nasal examination shows no deformity or inflammation. Nasal mucosa are pink and moist without lesions or exudates. Mouth:  teeth and gums in good repair; mucous membranes moist, without lesions or ulcers. oropharynx clear without exudate, no erythema.  Neck:  No deformities, masses, or tenderness noted. Lungs:  normal respiratory effort, no intercostal retractions or use of accessory muscles; normal breath sounds bilaterally - no crackles and no wheezes.    Heart:  normal rate, regular rhythm, no murmur, and no rub. BLE without edema. Abdomen:  soft, non-tender, normal bowel sounds, no distention; no masses and no appreciable hepatomegaly or splenomegaly.   Msk:  Lumbar-sacral spine is less  tender  on L to palpation over paraspinal muscles and less  painfull with the ROM   Extremities:  no edema B Neurologic:  alert & oriented X3 Less limp Str leg elev is neg  on L Skin:  WNL Psych:  Not anxious   Impression & Recommendations:  Problem # 1:  CEREBROVASCULAR ACCIDENT, HX OF (ICD-V12.50) Assessment Comment Only Risks of noncompliance with HTN  treatment discussed. Compliance encouraged.   Problem # 2:  HYPERTENSION (ICD-401.9) Assessment: Deteriorated Risks of noncompliance with treatment discussed. Compliance encouraged.  The following  medications were removed from the medication list:    Exforge Hct 10-320-25 Mg Tabs (Amlodipine-valsartan-hctz) .Marland Kitchen... 1 by mouth qd Her updated medication list for this problem includes:    Amlodipine Besylate 10 Mg Tabs (Amlodipine besylate) .Marland Kitchen... 1 by mouth once daily for blood pressure    Hyzaar 100-25 Mg Tabs (Losartan potassium-hctz) .Marland Kitchen... 1 by mouth qd    Aldactone 100 Mg Tabs (Spironolactone) .Marland Kitchen... 1 by mouth qd  BP today: 160/90 Prior BP: 120/70 (11/03/2010)  Labs Reviewed: K+: 4.8 (12/19/2009) Creat: : 1.0 (12/19/2009)   Chol: 174 (12/19/2009)   HDL: 61.20 (12/19/2009)   LDL: 103 (12/19/2009)   TG: 50.0 (12/19/2009)  Problem # 3:  OSTEOARTHRITIS (ICD-715.90) Assessment: Unchanged  Her updated medication list for this problem includes:    Aspirin 325 Mg Tabs (Aspirin) .Marland Kitchen... 1 by mouth once daily pc    Naproxen 500 Mg Tabs (Naproxen) .Marland Kitchen... 1 by mouth two times a day pc for pain/arthritis    Hydrocodone-acetaminophen 5-325 Mg Tabs (Hydrocodone-acetaminophen) .Marland Kitchen... 1-2 by mouth two times a day as needed pain  Problem # 4:  ANXIETY (ICD-300.00) Assessment: Unchanged  Her updated medication list for this problem includes:    Alprazolam 0.5 Mg Tabs (Alprazolam) .Marland Kitchen... 1 by mouth two times a day as needed anxiety  Problem # 5:  DIABETES MELLITUS, TYPE II (ICD-250.00) Assessment: Unchanged  The following medications were removed from the medication list:    Exforge Hct 10-320-25 Mg Tabs (Amlodipine-valsartan-hctz) .Marland Kitchen... 1 by mouth qd Her updated medication list for this problem includes:    Hyzaar 100-25 Mg Tabs (Losartan potassium-hctz) .Marland Kitchen... 1 by mouth qd    Aspirin 325 Mg Tabs (Aspirin) .Marland Kitchen... 1 by mouth once daily pc  Complete Medication List: 1)  Crestor 10 Mg Tabs (Rosuvastatin calcium) .Marland Kitchen.. 1 by mouth at bedtime 2)  Amlodipine Besylate 10 Mg Tabs (Amlodipine besylate) .Marland Kitchen.. 1 by mouth once daily for blood pressure 3)  Hyzaar 100-25 Mg Tabs (Losartan potassium-hctz) .Marland Kitchen.. 1  by mouth qd 4)  Aldactone 100 Mg Tabs (Spironolactone) .Marland Kitchen.. 1 by mouth qd 5)  Advair Diskus 250-50 Mcg/dose Misc (Fluticasone-salmeterol) .... As needed 6)  Triamcinolone Acetonide 0.5 % Crea (Triamcinolone acetonide) .... Apply bid to affected area 7)  Loratadine 10 Mg Tabs (Loratadine) .Marland Kitchen.. 1 by mouth once daily as needed allergies 8)  Alprazolam 0.5 Mg Tabs (Alprazolam) .Marland Kitchen.. 1 by mouth two times a day as needed anxiety 9)  Aspirin 325 Mg Tabs (Aspirin) .Marland Kitchen.. 1 by mouth once daily pc 10)  Vitamin D3 1000 Unit Tabs (Cholecalciferol) .... 2 once daily 11)  Promethazine Hcl 25 Mg Tabs (Promethazine hcl) .Marland Kitchen.. 1 by mouth every 4 hours as needed for nausea and vomitting - may cause sedation 12)  Ventolin Hfa 108 (90 Base) Mcg/act Aers (Albuterol sulfate) .... 2 inh up to qid as needed wheezing or shortness of breath 13)  Naproxen 500 Mg Tabs (Naproxen) .Marland Kitchen.. 1 by mouth two times a  day pc for pain/arthritis 14)  Omeprazole 40 Mg Cpdr (Omeprazole) .Marland Kitchen.. 1 by mouth qam for indigestion - take with naproxen 15)  Hydrocodone-acetaminophen 5-325 Mg Tabs (Hydrocodone-acetaminophen) .Marland Kitchen.. 1-2 by mouth two times a day as needed pain  Patient Instructions: 1)  Please schedule a follow-up appointment in 1 month. 2)  BMP prior to visit, ICD-9: 3)  Hepatic Panel prior to visit, ICD-9: 4)  Lipid Panel prior to visit, ICD-9:250.00 401.1  995.20 5)  HbgA1C prior to visit, ICD-9: Prescriptions: AMLODIPINE BESYLATE 10 MG TABS (AMLODIPINE BESYLATE) 1 by mouth once daily for blood pressure  #30 x 12   Entered and Authorized by:   Cassandria Anger MD   Signed by:   Cassandria Anger MD on 01/08/2011   Method used:   Print then Give to Patient   RxID:   [id number] HYZAAR 100-25 MG TABS (LOSARTAN POTASSIUM-HCTZ) 1 by mouth qd  #30 x 12   Entered and Authorized by:   Cassandria Anger MD   Signed by:   Cassandria Anger MD on 01/08/2011   Method used:   Print then Give to Patient   RxID:    [id number]    Orders Added: 1)  Est. Patient Level IV [id number]

## 2011-01-14 ENCOUNTER — Ambulatory Visit: Payer: Self-pay | Admitting: Internal Medicine

## 2011-01-19 LAB — DIFFERENTIAL
Eosinophils Relative: 2 % (ref 0–5)
Lymphocytes Relative: 30 % (ref 12–46)
Lymphs Abs: 2 10*3/uL (ref 0.7–4.0)
Monocytes Absolute: 0.6 10*3/uL (ref 0.1–1.0)
Monocytes Relative: 10 % (ref 3–12)

## 2011-01-19 LAB — GLUCOSE, CAPILLARY
Glucose-Capillary: 100 mg/dL — ABNORMAL HIGH (ref 70–99)
Glucose-Capillary: 110 mg/dL — ABNORMAL HIGH (ref 70–99)
Glucose-Capillary: 143 mg/dL — ABNORMAL HIGH (ref 70–99)
Glucose-Capillary: 202 mg/dL — ABNORMAL HIGH (ref 70–99)

## 2011-01-19 LAB — HEMOGLOBIN A1C
Hgb A1c MFr Bld: 6.3 % — ABNORMAL HIGH (ref <5.7)
Mean Plasma Glucose: 134 mg/dL — ABNORMAL HIGH (ref <117)

## 2011-01-19 LAB — CBC
HCT: 40.9 % (ref 36.0–46.0)
Hemoglobin: 14 g/dL (ref 12.0–15.0)
Platelets: 236 10*3/uL (ref 150–400)
RDW: 14.1 % (ref 11.5–15.5)
WBC: 6.7 10*3/uL (ref 4.0–10.5)

## 2011-01-19 LAB — POCT I-STAT, CHEM 8
BUN: 15 mg/dL (ref 6–23)
Calcium, Ion: 1.13 mmol/L (ref 1.12–1.32)
Creatinine, Ser: 1.1 mg/dL (ref 0.4–1.2)
TCO2: 27 mmol/L (ref 0–100)

## 2011-01-19 LAB — COMPREHENSIVE METABOLIC PANEL
AST: 17 U/L (ref 0–37)
Albumin: 3.3 g/dL — ABNORMAL LOW (ref 3.5–5.2)
Calcium: 9.5 mg/dL (ref 8.4–10.5)
Chloride: 105 mEq/L (ref 96–112)
Creatinine, Ser: 0.98 mg/dL (ref 0.4–1.2)
GFR calc Af Amer: >60 mL/min (ref >60)
Total Bilirubin: 0.5 mg/dL (ref 0.3–1.2)
Total Protein: 7.1 g/dL (ref 6.0–8.3)

## 2011-01-19 LAB — LIPID PANEL
LDL Cholesterol: 123 mg/dL — ABNORMAL HIGH (ref 0–99)
Triglycerides: 61 mg/dL (ref <150)

## 2011-01-19 LAB — TSH: TSH: 1.916 u[IU]/mL (ref 0.350–4.500)

## 2011-01-19 LAB — POCT CARDIAC MARKERS: Myoglobin, poc: 182 ng/mL (ref 12–200)

## 2011-02-03 ENCOUNTER — Telehealth: Payer: Self-pay | Admitting: *Deleted

## 2011-02-03 NOTE — Telephone Encounter (Signed)
[name] [name] states she did a re-assesment  today. She was referred for LBP and LE pain....pt has gait deterioration and progressive neurological weakness and she needs ongoing medical eval/care

## 2011-02-03 NOTE — Telephone Encounter (Signed)
Noted. Keep ROV.

## 2011-02-04 MED ORDER — TRAMADOL HCL 50 MG PO TABS: 50.0000 mg | ORAL_TABLET | Freq: Two times a day (BID) | ORAL | Status: AC | PRN

## 2011-02-04 NOTE — Telephone Encounter (Signed)
Pt states that she is not getting any relief from the Naproxen forarthritis pain. Pt request a different Rx for pain to be [facility] to help until she comes in on 02/19/11 for OV to assess the new problems given by Nicki Rodgers/PT [additional weakness]. She will be at work today until 1:00pm @ [phone]. Please advise.

## 2011-02-04 NOTE — Telephone Encounter (Signed)
Can try Tramadol 50 mg 1 po bid  #60 2 ref Keep ROV

## 2011-02-04 NOTE — Telephone Encounter (Signed)
Per Dr Alain Marion, called in Rx for Tramadol 50mg  to [facility] [address] Attempted to reach Pt via telephone. No answer & no ans machine

## 2011-02-04 NOTE — Telephone Encounter (Signed)
Pt informed

## 2011-02-12 ENCOUNTER — Other Ambulatory Visit: Payer: Self-pay | Admitting: Internal Medicine

## 2011-02-12 ENCOUNTER — Other Ambulatory Visit: Payer: Medicare Other

## 2011-02-12 DIAGNOSIS — I10 Essential (primary) hypertension: Secondary | ICD-10-CM

## 2011-02-12 DIAGNOSIS — E7800 Pure hypercholesterolemia, unspecified: Secondary | ICD-10-CM

## 2011-02-12 DIAGNOSIS — T887XXA Unspecified adverse effect of drug or medicament, initial encounter: Secondary | ICD-10-CM

## 2011-02-12 DIAGNOSIS — E119 Type 2 diabetes mellitus without complications: Secondary | ICD-10-CM

## 2011-02-17 ENCOUNTER — Emergency Department (HOSPITAL_COMMUNITY)
Admission: EM | Admit: 2011-02-17 | Discharge: 2011-02-18 | Disposition: A | Discharge status: Home / Self Care | Payer: Medicare Other | Attending: Emergency Medicine | Admitting: Emergency Medicine

## 2011-02-17 ENCOUNTER — Emergency Department (HOSPITAL_COMMUNITY): Payer: Medicare Other

## 2011-02-17 DIAGNOSIS — R937 Abnormal findings on diagnostic imaging of other parts of musculoskeletal system: Secondary | ICD-10-CM | POA: Insufficient documentation

## 2011-02-17 DIAGNOSIS — M129 Arthropathy, unspecified: Secondary | ICD-10-CM | POA: Insufficient documentation

## 2011-02-17 DIAGNOSIS — Z853 Personal history of malignant neoplasm of breast: Secondary | ICD-10-CM | POA: Insufficient documentation

## 2011-02-17 DIAGNOSIS — I10 Essential (primary) hypertension: Secondary | ICD-10-CM | POA: Insufficient documentation

## 2011-02-17 DIAGNOSIS — M79609 Pain in unspecified limb: Secondary | ICD-10-CM | POA: Insufficient documentation

## 2011-02-17 DIAGNOSIS — M25559 Pain in unspecified hip: Secondary | ICD-10-CM | POA: Insufficient documentation

## 2011-02-17 DIAGNOSIS — J45909 Unspecified asthma, uncomplicated: Secondary | ICD-10-CM | POA: Insufficient documentation

## 2011-02-17 DIAGNOSIS — M545 Low back pain, unspecified: Secondary | ICD-10-CM | POA: Insufficient documentation

## 2011-02-19 ENCOUNTER — Ambulatory Visit: Payer: Medicare Other | Admitting: Internal Medicine

## 2011-02-22 ENCOUNTER — Other Ambulatory Visit: Payer: Self-pay | Admitting: Physical Medicine and Rehabilitation

## 2011-02-22 DIAGNOSIS — M25552 Pain in left hip: Secondary | ICD-10-CM

## 2011-02-22 DIAGNOSIS — C801 Malignant (primary) neoplasm, unspecified: Secondary | ICD-10-CM

## 2011-02-23 ENCOUNTER — Other Ambulatory Visit (HOSPITAL_COMMUNITY): Payer: Self-pay | Admitting: Physical Medicine and Rehabilitation

## 2011-02-23 ENCOUNTER — Ambulatory Visit
Admission: RE | Admit: 2011-02-23 | Discharge: 2011-02-23 | Disposition: A | Discharge status: Home / Self Care | Payer: Medicare Other | Source: Ambulatory Visit | Attending: Physical Medicine and Rehabilitation | Admitting: Physical Medicine and Rehabilitation

## 2011-02-23 DIAGNOSIS — R935 Abnormal findings on diagnostic imaging of other abdominal regions, including retroperitoneum: Secondary | ICD-10-CM

## 2011-02-23 DIAGNOSIS — C801 Malignant (primary) neoplasm, unspecified: Secondary | ICD-10-CM

## 2011-02-23 DIAGNOSIS — M25552 Pain in left hip: Secondary | ICD-10-CM

## 2011-02-26 ENCOUNTER — Encounter (HOSPITAL_COMMUNITY)
Admission: RE | Admit: 2011-02-26 | Discharge: 2011-02-26 | Disposition: A | Discharge status: Home / Self Care | Payer: Medicare Other | Source: Ambulatory Visit | Attending: Physical Medicine and Rehabilitation | Admitting: Physical Medicine and Rehabilitation

## 2011-02-26 ENCOUNTER — Encounter (HOSPITAL_COMMUNITY): Payer: Self-pay

## 2011-02-26 DIAGNOSIS — R935 Abnormal findings on diagnostic imaging of other abdominal regions, including retroperitoneum: Secondary | ICD-10-CM

## 2011-02-26 DIAGNOSIS — R9389 Abnormal findings on diagnostic imaging of other specified body structures: Secondary | ICD-10-CM | POA: Insufficient documentation

## 2011-02-26 MED ORDER — TECHNETIUM TC 99M MEDRONATE IV KIT
23.6000 | PACK | Freq: Once | INTRAVENOUS | Status: AC | PRN
  Administered 2011-02-26: 23.6 via INTRAVENOUS

## 2011-03-01 ENCOUNTER — Encounter (HOSPITAL_BASED_OUTPATIENT_CLINIC_OR_DEPARTMENT_OTHER): Payer: Medicare Other | Admitting: Internal Medicine

## 2011-03-01 ENCOUNTER — Other Ambulatory Visit: Payer: Self-pay | Admitting: Internal Medicine

## 2011-03-01 DIAGNOSIS — C50219 Malignant neoplasm of upper-inner quadrant of unspecified female breast: Secondary | ICD-10-CM

## 2011-03-01 DIAGNOSIS — Z170 Estrogen receptor positive status [ER+]: Secondary | ICD-10-CM

## 2011-03-01 LAB — CBC WITH DIFFERENTIAL/PLATELET
BASO%: 0.4 % (ref 0.0–2.0)
EOS%: 2.4 % (ref 0.0–7.0)
MCH: 29.7 pg (ref 25.1–34.0)
MCHC: 33.6 g/dL (ref 31.5–36.0)
RBC: 4.46 10*6/uL (ref 3.70–5.45)
RDW: 13.9 % (ref 11.2–14.5)
lymph#: 1.3 10*3/uL (ref 0.9–3.3)

## 2011-03-01 LAB — COMPREHENSIVE METABOLIC PANEL
ALT: 9 U/L (ref 0–35)
AST: 12 U/L (ref 0–37)
Albumin: 4.1 g/dL (ref 3.5–5.2)
Alkaline Phosphatase: 95 U/L (ref 39–117)
Calcium: 10.2 mg/dL (ref 8.4–10.5)
Chloride: 105 mEq/L (ref 96–112)
Potassium: 4.1 mEq/L (ref 3.5–5.3)

## 2011-03-02 ENCOUNTER — Other Ambulatory Visit: Payer: Self-pay | Admitting: Internal Medicine

## 2011-03-02 DIAGNOSIS — M999 Biomechanical lesion, unspecified: Secondary | ICD-10-CM

## 2011-03-09 ENCOUNTER — Other Ambulatory Visit: Payer: Self-pay | Admitting: Interventional Radiology

## 2011-03-09 ENCOUNTER — Ambulatory Visit (HOSPITAL_COMMUNITY)
Admission: RE | Admit: 2011-03-09 | Discharge: 2011-03-09 | Disposition: A | Discharge status: Home / Self Care | Payer: Medicare Other | Source: Ambulatory Visit | Attending: Internal Medicine | Admitting: Internal Medicine

## 2011-03-09 DIAGNOSIS — M999 Biomechanical lesion, unspecified: Secondary | ICD-10-CM

## 2011-03-09 DIAGNOSIS — M899 Disorder of bone, unspecified: Secondary | ICD-10-CM | POA: Insufficient documentation

## 2011-03-09 DIAGNOSIS — Z853 Personal history of malignant neoplasm of breast: Secondary | ICD-10-CM | POA: Insufficient documentation

## 2011-03-09 LAB — PROTIME-INR
INR: 1 (ref 0.00–1.49)
Prothrombin Time: 13.4 seconds (ref 11.6–15.2)

## 2011-03-09 LAB — CBC
HCT: 41.3 % (ref 36.0–46.0)
MCH: 28.8 pg (ref 26.0–34.0)
MCHC: 33.2 g/dL (ref 30.0–36.0)
RDW: 13.6 % (ref 11.5–15.5)

## 2011-03-09 LAB — APTT: aPTT: 31 seconds (ref 24–37)

## 2011-03-17 ENCOUNTER — Encounter (HOSPITAL_BASED_OUTPATIENT_CLINIC_OR_DEPARTMENT_OTHER): Payer: Medicare Other | Admitting: Internal Medicine

## 2011-03-17 DIAGNOSIS — C50219 Malignant neoplasm of upper-inner quadrant of unspecified female breast: Secondary | ICD-10-CM

## 2011-03-17 DIAGNOSIS — C7952 Secondary malignant neoplasm of bone marrow: Secondary | ICD-10-CM

## 2011-03-19 NOTE — Op Note (Signed)
[place]. [facility]  Patient:    [name] [name], [name] [name] Visit Number: [id number] MRN: [id number]          Service Type: DSU Location: [facility] [id number] Attending Physician:  Sherolyn Buba Dictated by:   Candee Furbish Bubba Camp, M.D. Proc. Date: 06/27/01 Adm. Date:  06/27/2001                             Operative Report  PREOPERATIVE DIAGNOSIS:  Poor venous access, status post lumpectomy and axillary lymph node dissection for breast cancer.  POSTOPERATIVE DIAGNOSIS:  Poor venous access, status post lumpectomy and axillary lymph node dissection for breast cancer.  PROCEDURE:  Port-A-Cath implantation for facilitation of chemotherapy.  SURGEON:  Candee Furbish. Bubba Camp, M.D.  ASSISTANT:  Nurse.  ANESTHESIA:  MAC.  I used 0.5% Marcaine with 1:200,000 epinephrine and 1% lidocaine plain.  CLINICAL NOTE:  This patient is a 70 year old woman brought to the operating room for implantation of a Port-A-Cath to facilitate chemotherapy.  She has very poor venous access and needs to have a Port-A-Cath.  DESCRIPTION OF PROCEDURE:  Following the induction of satisfactory sedation, the patient is placed in Trendelenburg position.  The left subclavian region is infiltrated with 1% Xylocaine plain, and I made a subclavian stick into the left subclavian vein, threading a guidewire into the central venous system under fluoroscopic guidance, positioning the tip of the guidewire at the atrial-vena caval junction.  The inflow of heparinized saline and blood flow were noted to be good at this point.  A pocket was created on the anterior chest wall and a Silastic catheter threaded from the pocket up to the shoulder.  The Silastic catheter was flushed with heparinized saline, and I used a size 10 Cook introducer dilator over the dilator and inserted that into the central venous system.  I removed the guidewire and the dilator and placed the Silastic catheter into the insert and  positioned it at the atrial-vena caval junction.  The introducer was then stripped away, inflow of heparinized saline and blood return again were noted to be excellent.  The external portion of the catheter of the pocket was then cut and the reservoir securely attached and sutured into the pocket with 2-0 silk sutures.  Inflow of heparinized saline and blood returned through the reservoir was noted to be excellent.  Sponge, instrument, and sharp counts were verified.  Final fluoroscopic evaluation showed no bends, kinks, or turns within the course of the catheter, and the tip of the catheter was located at the atrial-vena caval junction.  The wound was then closed in layers as follows:  Subcutaneous tissues were closed with interrupted 3-0 Vicryl suture in both the shoulder and pocket wound.  Skin was closed with a running 5-0 Monocryl in both the shoulder and pocket wounds.  The wounds were reinforced with Steri-Strips and sterile dressings applied.  Anesthetic reversed, the patient removed from the operating room to the recovery room in stable condition.  She tolerated the procedure well. Dictated by:   Candee Furbish. Bubba Camp, M.D. Attending Physician:  Sherolyn Buba DD:  06/27/01 TD:  06/27/01 Job: [id number]

## 2011-03-19 NOTE — Op Note (Signed)
[place]. [facility]  Patient:    [name] [name], [name] [name]                     MRN: [id number] Proc. Date: 05/18/01 Adm. Date:  [id number] Attending:  Sherolyn Buba                           Operative Report  PREOPERATIVE DIAGNOSIS:  Carcinoma of the right breast.  POSTOPERATIVE DIAGNOSIS:  Carcinoma of the right breast.  OPERATION PERFORMED:  Right-sided lumpectomy and sentinel lymph node biopsy with axillary lymph node dissection.  SURGEON:  Candee Furbish. Bubba Camp, M.D.  ANESTHESIA:  INDICATIONS FOR PROCEDURE:  The patient is a 70 year old female presenting with an abnormal mammogram which on ultrasound guided core biopsy shows invasive carcinoma.  She is brought to the operating room for lumpectomy and sentinel lymph node biopsy with possible axillary lymph node dissection.  DESCRIPTION OF PROCEDURE:  Following the induction of satisfactory general anesthesia, the patient was positioned supinely.  The right breast and axilla were prepped and draped to be included in the sterile operative field.  The patient had been previously injected with radioactive radionuclide dye and she was now injected with Lymphazurin dye which was massaged into the skin for approximately five minutes.  This injection was done in the periareolar region.  The Neoprobe was then used to indicate a spot in the axilla where maximum radioactivity was being emanated.  An elliptical incision around the mass which was located in the 2 to 3 oclock axis of the right breast was deepened through the skin and subcutaneous tissues with a wide margin taken around the breast down to the chest wall and the mass was removed in its entirety and forwarded for pathologic evaluation. Touch Prep on the margins were negative.  Hemostasis was obtained within the wound and the wound was packed temporarily with attention now turned to the axilla.  A transverse axillary incision was made using the  Neoprobe, we dissected down to the region of the sentinel node.  The sentinel node was located and noted to be blue and hot.  It was dissected free from surrounding tissues, forwarded for pathologic evaluation.  On Touch Prep the sentinel node was positive for carcinoma.  The axillary incision was then extended and then an axillary lymph node dissection was performed by carrying dissection down to the anterior border of the latissimus dorsi and carrying dissection across the pectoralis major and minor muscle carrying up into the true axilla and starting at ____________ the entire  axillary content was dissected free from the axillary vein maintaining hemostasis throughout the course of dissection with clips. Laterally, the intercostal humerocutaneous nerve was taken.  The long thoracic and thoracodorsal nerves were identified and protected.  The entire axillary content was dissected free and forwarded for pathologic evaluation.  The axilla was then thoroughly irrigated with normal saline.  The breast wound was first closed with interrupted 3-0 Vicryl sutures in the subcutaneous tissues and a running 5-0 Monocryl.  The axillary wound was then similarly closed with interrupted 2-0 Vicryl sutures in the subcutaneous layer followed by a running 5-0 Monocryl suture.  All wounds were then reinforced with Steri-Strips after Jackson-Pratt drain was placed in the axilla for drainage.  The drain was attached to the reservoir.  The sterile dressings were then applied. Anesthetic reversed.  Patient removed from the operating room to the recovery room in stable condition having  tolerated the procedure well. DD:  05/18/01 TD:  05/18/01 Job: [id number]

## 2011-03-22 ENCOUNTER — Ambulatory Visit
Admission: RE | Admit: 2011-03-22 | Discharge: 2011-03-22 | Disposition: A | Discharge status: Home / Self Care | Payer: Medicare Other | Source: Ambulatory Visit | Attending: Radiation Oncology | Admitting: Radiation Oncology

## 2011-03-22 DIAGNOSIS — Z7982 Long term (current) use of aspirin: Secondary | ICD-10-CM | POA: Insufficient documentation

## 2011-03-22 DIAGNOSIS — C7952 Secondary malignant neoplasm of bone marrow: Secondary | ICD-10-CM | POA: Insufficient documentation

## 2011-03-22 DIAGNOSIS — Z510 Encounter for antineoplastic radiation therapy: Secondary | ICD-10-CM | POA: Insufficient documentation

## 2011-03-22 DIAGNOSIS — Z853 Personal history of malignant neoplasm of breast: Secondary | ICD-10-CM | POA: Insufficient documentation

## 2011-03-22 DIAGNOSIS — C7951 Secondary malignant neoplasm of bone: Secondary | ICD-10-CM | POA: Insufficient documentation

## 2011-03-22 DIAGNOSIS — Z79899 Other long term (current) drug therapy: Secondary | ICD-10-CM | POA: Insufficient documentation

## 2011-03-30 ENCOUNTER — Telehealth: Payer: Self-pay | Admitting: *Deleted

## 2011-03-30 ENCOUNTER — Emergency Department (HOSPITAL_COMMUNITY): Payer: Medicare Other

## 2011-03-30 ENCOUNTER — Inpatient Hospital Stay (HOSPITAL_COMMUNITY): Payer: Medicare Other

## 2011-03-30 ENCOUNTER — Inpatient Hospital Stay (HOSPITAL_COMMUNITY)
Admission: EM | Admit: 2011-03-30 | Discharge: 2011-04-05 | DRG: 470 | Disposition: A | Discharge status: Skilled Nursing Facility | Payer: Medicare Other | Attending: Orthopedic Surgery | Admitting: Orthopedic Surgery

## 2011-03-30 DIAGNOSIS — C7951 Secondary malignant neoplasm of bone: Secondary | ICD-10-CM | POA: Diagnosis present

## 2011-03-30 DIAGNOSIS — E119 Type 2 diabetes mellitus without complications: Secondary | ICD-10-CM | POA: Diagnosis present

## 2011-03-30 DIAGNOSIS — C50919 Malignant neoplasm of unspecified site of unspecified female breast: Secondary | ICD-10-CM | POA: Diagnosis present

## 2011-03-30 DIAGNOSIS — C7952 Secondary malignant neoplasm of bone marrow: Secondary | ICD-10-CM | POA: Diagnosis present

## 2011-03-30 DIAGNOSIS — Z8673 Personal history of transient ischemic attack (TIA), and cerebral infarction without residual deficits: Secondary | ICD-10-CM

## 2011-03-30 DIAGNOSIS — M84453A Pathological fracture, unspecified femur, initial encounter for fracture: Principal | ICD-10-CM | POA: Diagnosis present

## 2011-03-30 DIAGNOSIS — I10 Essential (primary) hypertension: Secondary | ICD-10-CM | POA: Diagnosis present

## 2011-03-30 LAB — DIFFERENTIAL
Basophils Absolute: 0 10*3/uL (ref 0.0–0.1)
Basophils Relative: 1 % (ref 0–1)
Eosinophils Absolute: 0.2 10*3/uL (ref 0.0–0.7)
Eosinophils Relative: 3 % (ref 0–5)
Lymphocytes Relative: 27 % (ref 12–46)
Lymphs Abs: 1.5 10*3/uL (ref 0.7–4.0)
Monocytes Absolute: 0.7 K/uL (ref 0.1–1.0)
Monocytes Relative: 13 % — ABNORMAL HIGH (ref 3–12)
Neutro Abs: 3.2 K/uL (ref 1.7–7.7)
Neutrophils Relative %: 57 % (ref 43–77)

## 2011-03-30 LAB — BASIC METABOLIC PANEL
BUN: 13 mg/dL (ref 6–23)
Chloride: 103 mEq/L (ref 96–112)
GFR calc Af Amer: >60 mL/min (ref >60)
GFR calc non Af Amer: >60 mL/min (ref >60)
Potassium: 3.9 mEq/L (ref 3.5–5.1)
Sodium: 138 mEq/L (ref 135–145)

## 2011-03-30 LAB — CBC
HCT: 37.9 % (ref 36.0–46.0)
Hemoglobin: 12.5 g/dL (ref 12.0–15.0)
MCH: 28.5 pg (ref 26.0–34.0)
MCHC: 33 g/dL (ref 30.0–36.0)
MCV: 86.3 fL (ref 78.0–100.0)
Platelets: 251 10*3/uL (ref 150–400)
RBC: 4.39 MIL/uL (ref 3.87–5.11)
RDW: 13.9 % (ref 11.5–15.5)
WBC: 5.6 10*3/uL (ref 4.0–10.5)

## 2011-03-30 LAB — URINE MICROSCOPIC-ADD ON

## 2011-03-30 LAB — URINALYSIS, MICROSCOPIC ONLY
Bilirubin Urine: NEGATIVE
Glucose, UA: NEGATIVE mg/dL
Protein, ur: NEGATIVE mg/dL
Urobilinogen, UA: 0.2 mg/dL (ref 0.0–1.0)

## 2011-03-30 LAB — URINALYSIS, ROUTINE W REFLEX MICROSCOPIC
Glucose, UA: NEGATIVE mg/dL
Hgb urine dipstick: NEGATIVE
Ketones, ur: NEGATIVE mg/dL
Leukocytes, UA: NEGATIVE
Nitrite: NEGATIVE
Protein, ur: 30 mg/dL — AB
Specific Gravity, Urine: 1.033 — ABNORMAL HIGH (ref 1.005–1.030)
Urobilinogen, UA: 0.2 mg/dL (ref 0.0–1.0)
pH: 6 (ref 5.0–8.0)

## 2011-03-30 LAB — BASIC METABOLIC PANEL WITH GFR
CO2: 24 meq/L (ref 19–32)
Calcium: 8.3 mg/dL — ABNORMAL LOW (ref 8.4–10.5)
Creatinine, Ser: 0.92 mg/dL (ref 0.4–1.2)
Glucose, Bld: 106 mg/dL — ABNORMAL HIGH (ref 70–99)

## 2011-03-30 NOTE — H&P (Addendum)
[name] [name], [name] [name]              ACCOUNT NO.:  [id number]  MEDICAL RECORD NO.:  [id number]  LOCATION:                                 FACILITY:  PHYSICIAN:  Pietro Cassis. Alvan Dame, M.D.  DATE OF BIRTH:  01/18/1935  DATE OF ADMISSION: DATE OF DISCHARGE:                             HISTORY & PHYSICAL   CHIEF COMPLIANT ON ADMISSION:  Metastatic lesion, left femoral neck with impending pathologic fracture.  HISTORY:  The patient is a 70 year old female with history of breast cancer diagnosed in 2002.  She had been in her usual state of health until over the past month when she was having the hip pain, this had been worked up extensively with concerns of back etiology and subsequent radiographs of the hip revealed this lytic lesion.  She is seen and evaluated in the office on referral basis for this for surgical consideration as opposed to acute initial radiation treatment.  She has had increasing pain and limited ability to bear weight due to amount of discomfort.  A history and physical was performed in the office today after reviewing the pathology with her.  Past surgical history includes lumpectomy and hysterectomy.  MEDICAL HISTORY:  Includes breast cancer in 2002, hypertension.  FAMILY MEDICAL HISTORY:  Hypertension.  Negative for diabetes.  CURRENT MEDICATIONS:  Available through the medical reconciliation as the patient will meet with the pharmacist at admission.  DRUG ALLERGIES:  No known drug allergies.  SOCIAL HISTORY:  She works in Medical sales representative work.  Smokes half pack of cigarettes a day.  Her primary physician is Dr. Tyrone Apple [name].  Her oncologist is Dr. Earlie Server.  REVIEW OF SYSTEMS:  Negative for recent headaches, dizziness, blurred vision, slurred speech.  Importantly, she has had no unexplained weight loss, fevers, chills or night sweats.  She has had no chest pains, productive cough, hemoptysis, wheezing or shortness present on exertion. No abdominal pain,  nausea, vomiting, constipation, diarrhea.  No unilateral weakness or numbness.  Only hip pain as noted in the HPI.  PHYSICAL EXAMINATION:  VITAL SIGNS:  In the office, she had a pulse of 60, respirations 16, blood pressure 153/94.  She is afebrile. GENERAL:  She is awake, alert and oriented, using assisted device to get around the office in terms of wheelchair. CHEST:  Her chest is clear to auscultation. HEART:  Her heart had a regular rate and rhythm. ABDOMEN:  Her abdomen was soft and nontender. NEUROLOGIC:  Neurovascularly, she was intact distally without evidence any venous lymphatic changes. MUSCULOSKELETAL:  Examination of musculoskeletal revealed pain with hip flexion, internal rotation, tenderness to palpation laterally. Otherwise, palpable pulses, intact sensibility with normal reflexes.  RADIOGRAPHS:  Reviewed from the [place], but also ordered in our office today revealed a large lytic lesion within the tension side of the left femoral neck incorporating at least 75-80% of the femoral neck region.  No evidence of obvious fracture, questionable nondisplaced fracture is identified.  ASSESSMENT:  A large lytic lesion of left femoral neck, most likely metastatic in nature with history of breast cancer diagnosed in 2002 with an impending pathologic fracture.  PLAN:  The patient is to be admitted for same day surgery on  Thursday, May 31.  She will undergo left total hip replacement per our discussion today.  Following the healing of up to 4 to 6 weeks, we will reinitiate consultation with radiation therapy versus further chemotherapy treatment with Dr. Earlie Server.  Questions were encouraged, answered reviewed regarding the necessity of the procedure, the risks and benefits of the procedure as well as long- term expectations and goals.     Pietro Cassis Alvan Dame, M.D.     MDO/MEDQ  D:  03/29/2011  T:  03/30/2011  Job:  [id number]  cc:   Lora Paula, M.D. [phone]  Eilleen Kempf, M.D. Fax: [phone]  [name]. [name], MD [address] [place] [address]  Electronically Signed by Paralee Cancel M.D. on 04/20/2011 09:08:43 AM

## 2011-03-30 NOTE — Telephone Encounter (Signed)
FYI - Pt is scheduled for surgery Thursday on her hip with Dr Alvan Dame. She is currently at the ER at [facility].

## 2011-03-30 NOTE — Telephone Encounter (Signed)
Noted. Thx.

## 2011-03-31 LAB — BASIC METABOLIC PANEL
BUN: 11 mg/dL (ref 6–23)
Chloride: 99 mEq/L (ref 96–112)
Creatinine, Ser: 0.93 mg/dL (ref 0.4–1.2)
GFR calc Af Amer: >60 mL/min (ref >60)
GFR calc non Af Amer: 60 mL/min — ABNORMAL LOW (ref >60)

## 2011-03-31 LAB — CBC
HCT: 38.8 % (ref 36.0–46.0)
Hemoglobin: 12.3 g/dL (ref 12.0–15.0)
MCHC: 31.7 g/dL (ref 30.0–36.0)
RDW: 13.9 % (ref 11.5–15.5)
WBC: 6.5 10*3/uL (ref 4.0–10.5)

## 2011-03-31 LAB — PROTIME-INR
INR: 1.09 (ref 0.00–1.49)
Prothrombin Time: 14.3 seconds (ref 11.6–15.2)

## 2011-03-31 LAB — URINE CULTURE: Special Requests: NEGATIVE

## 2011-04-01 ENCOUNTER — Other Ambulatory Visit: Payer: Self-pay | Admitting: Orthopedic Surgery

## 2011-04-01 ENCOUNTER — Inpatient Hospital Stay (HOSPITAL_COMMUNITY): Payer: Medicare Other

## 2011-04-01 ENCOUNTER — Inpatient Hospital Stay (HOSPITAL_COMMUNITY): Admission: RE | Admit: 2011-04-01 | Payer: Medicare Other | Source: Ambulatory Visit | Admitting: Orthopedic Surgery

## 2011-04-01 LAB — GLUCOSE, CAPILLARY
Glucose-Capillary: 135 mg/dL — ABNORMAL HIGH (ref 70–99)
Glucose-Capillary: 118 mg/dL — ABNORMAL HIGH (ref 70–99)

## 2011-04-01 LAB — PROTIME-INR: INR: 1.03 (ref 0.00–1.49)

## 2011-04-01 LAB — MRSA PCR SCREENING: MRSA by PCR: NEGATIVE

## 2011-04-02 LAB — CBC
MCV: 86.2 fL (ref 78.0–100.0)
Platelets: 252 10*3/uL (ref 150–400)
RBC: 3.91 MIL/uL (ref 3.87–5.11)
WBC: 8.7 10*3/uL (ref 4.0–10.5)

## 2011-04-02 LAB — BASIC METABOLIC PANEL
Chloride: 99 mEq/L (ref 96–112)
GFR calc Af Amer: >60 mL/min (ref >60)
Potassium: 4.4 mEq/L (ref 3.5–5.1)
Sodium: 133 mEq/L — ABNORMAL LOW (ref 135–145)

## 2011-04-02 LAB — PROTIME-INR
INR: 1.1 (ref 0.00–1.49)
Prothrombin Time: 14.4 seconds (ref 11.6–15.2)

## 2011-04-03 LAB — PROTIME-INR
INR: 1.1 (ref 0.00–1.49)
Prothrombin Time: 14.4 seconds (ref 11.6–15.2)

## 2011-04-03 LAB — CBC
Hemoglobin: 9.1 g/dL — ABNORMAL LOW (ref 12.0–15.0)
RBC: 3.23 MIL/uL — ABNORMAL LOW (ref 3.87–5.11)
WBC: 9.1 10*3/uL (ref 4.0–10.5)

## 2011-04-03 LAB — BASIC METABOLIC PANEL
GFR calc non Af Amer: >60 mL/min (ref >60)
Potassium: 4.2 mEq/L (ref 3.5–5.1)
Sodium: 134 mEq/L — ABNORMAL LOW (ref 135–145)

## 2011-04-04 LAB — PROTIME-INR: Prothrombin Time: 14.8 seconds (ref 11.6–15.2)

## 2011-04-04 NOTE — Op Note (Signed)
NAMEGREGORY, [name] [name]              ACCOUNT NO.:  [id number]  MEDICAL RECORD NO.:  [id number]           PATIENT TYPE:  I  LOCATION:  [id number]                         FACILITY:  [facility]  PHYSICIAN:  Pietro Cassis. Alvan Dame, M.D.  DATE OF BIRTH:  1941-08-04  DATE OF PROCEDURE:  04/01/2011 DATE OF DISCHARGE:                              OPERATIVE REPORT   PREOPERATIVE DIAGNOSIS:  Pathologic left femoral neck fracture with history of breast cancer new onset pain and lesions, pathology as yet to be identified.  POSTOPERATIVE DIAGNOSIS:  Pathologic left femoral neck fracture with history of breast cancer new onset pain and lesions, pathology as yet to be identified.  PROCEDURE:  Left total hip replacement utilizing DePuy component size forced high Tri-Lock stem, a 50 mm pinnacle cup, 32 +4 neutral liner and 32 +1 Delta ceramic ball.  SURGEON:  Pietro Cassis. Alvan Dame, M.D.  ASSISTANT:  Surgical team.  ANESTHESIA:  General.  BLOOD LOSS:  300 cc.  SPECIMEN:  The patient's femoral head and neck segment fracture and any other material around that area was sent to pathology for examination as a fresh specimen.  COMPLICATIONS:  None.  DRAINS:  1 Hemovac.  INDICATIONS:  [name] [name] is a 70 year old female who presented to the office at that the request of radiation oncologist treating left hip pain.  Radiographs had a very large lytic lesion within the tension side of the superior left femoral neck.  She had question whether or not there was a nondisplaced fracture, already she was seen and evaluated in the office with plan for surgical procedure the following week. However, at the weekend she had increasing pain.  Radiographs revealed perhaps some progress in the previously noted  or  concern for femoral neck fracture.  She was subsequently admitted on the 29th for planned procedure on Thursday the 31st.  Risks and benefits have been reviewed in the past, the benefits were discussed.  PROCEDURE IN  DETAIL:  The patient was brought to operative theater. Once adequate anesthesia preoperative antibiotics, Ancef administered, she was positioned into the right lateral decubitus position left side up.  The left lower extremity was then prepped and draped in sterile fashion.  A time-out was performed identifying the patient, planned procedure of extremity.  The lateral incision was made based up to the lateral proximal trochanter.  Sharp dissection was carried to the iliotibial band and gluteal fascia.  This was then incised for posterior approach.  A short external rotators and posterior capsule taken down with single capsular layer, preserving this layer for later anatomic repair, but also protect the sciatic nerve.  Once this area was opened and retractors placed intra-articular the fracture site was identified.  After internally rotated hip, I used an oscillating saw to further correct the neck osteotomy into trochanteric fossa removing this neck segment sent to pathology a specimen.  At this point, the patient have very large pathologic lesion within this femoral neck area which made difficult, initially perches the femoral head.  I was able to get a cortical rim of femoral neck and I then used this to removed the femoral head.  All  available and visible tissue from the femoral head neck area was sent to pathologist.  Following irrigation the hip was packed off the acetabulum now tended to the axis of the femur.  Femoral retractors were placed.  We used a starting reamer then hand reamed once.  Then I irrigated to prevent fat emboli.  After begin a broach with size 1 broach, and I broached up to a size 2 broach initially this sat at the level of my neck cut.  At this point, acetabular preparation was carried out, following removal of labrum and foveal tissue retractors were placed to begin reaming with 44 reamer, reamed up to 49 reamer with good bony bed preparation of 50 mm cup  was chosen using curved impactor.  The cup was then impacted approximately 20 degrees of forward flexion with a portion of the native around the visible anteriorly.  The cup appeared to be abducted about 35 to 40 degrees  using the hip guide.  A trial reduction now carried out with 2 broach in place.  Completion of acetabular work was carried out with 2 cancellous screw placed in the ilium and 32 +1 Ultrex  liner.  The 2 broach was then impacted, placed into high offset neck was chosen.  I did this trial reduction and found that there was 4-5 mm shuck and the leg felt short compared to the down leg.  For this reason I broached up to a size near these trial components and broached up to a size 4 broach was then set about 4-5 mm trial of the neck cut.  I repeated the trial with a high offset neck and 32 mm ball.  A combined anteversion seemed to be very appropriate 45-50 degrees and there was no significant evidence of any impingement with full flexion, internal rotation or extension external rotation.  Given all these parameters, I chose this my final stem.  Leg lengths appeared to be equal with only about a millimeter so shuck.  The final four high Tri-Lock stem was chosen and impacted.  It sat at the level where the where the broach had sat.  Based on this, in my trial reduction a 32 +1 Delta ceramic ball was chosen.  This was impacted onto clean dry trunnion.  The hip was reduced, hip had been irrigated throughout the case again at this point.  I reapproximated posterior capsular material and  short external rotators back to the superior leaflet with #1 Vicryl placed a medium Hemovac drain deep.  The remainder wound was closed with #1 Vicryl over the iliotibial band and gluteal fascia, 2-0 Vicryl on subcu layer and running 4-0 Monocryl.  The hip was then cleaned, dried and dressed sterilely using Dermabond and Aquacel dressing.  She was brought to recovery room, extubated, in stable  condition, tolerating the procedure fairly well.    Pietro Cassis Alvan Dame, M.D.    MDO/MEDQ  D:  04/01/2011  T:  04/02/2011  Job:  [id number]  Electronically Signed by Paralee Cancel M.D. on 04/04/2011 [id number] PM

## 2011-04-05 LAB — PROTIME-INR
INR: 1.1 (ref 0.00–1.49)
Prothrombin Time: 14.4 seconds (ref 11.6–15.2)

## 2011-04-12 NOTE — Discharge Summary (Signed)
NAMEDESTYNI, OGG              ACCOUNT NO.:  [id number]  MEDICAL RECORD NO.:  [id number]  LOCATION:  [id number]                         FACILITY:  [facility]  PHYSICIAN:  Pietro Cassis. Alvan Dame, M.D.  DATE OF BIRTH:  30-Apr-1941  DATE OF ADMISSION:  03/30/2011 DATE OF DISCHARGE:  04/05/2011                        DISCHARGE SUMMARY - REFERRING   ADMITTING DIAGNOSES: 1. Pathologic left femoral neck fracture. 2. History of breast cancer.  DISCHARGE DIAGNOSES: 1. Left pathologic femoral neck fracture status post left total hip     replacement done on Apr 01, 2011. 2. Breast cancer. 3. Hypertension.  BRIEF HISTORY:  Ms. Sorah is a very pleasant 70 year old female who was seen at the request of radiation oncologist Dr. Tyler Pita for a large lytic lesion on the tension side of the femoral neck.     There is already concern about fracture due to increased pain.  Surgery was necessary for management of pain as well as stabilization.  She, at this point remained, on hormonal therapy, but radiation therapy will be put on hold.  After reviewing with her the risks and benefits predominantly the risks of infection, DVT, component failure as well as dislocation, she wished to proceed in this fashion.  She was set to be admitted for same-day surgery on Apr 01, 2011, however, was admitted on the 29th due to increasing pain with any bit of function.  Surgery was still scheduled for 2 days later.  HOSPITAL COURSE:  The patient admitted for surgery on Mar 30, 2011. This was for pain as she had increased amount of discomfort over the weekend prior to admission.  Once brought into the hospital, she was cleared for surgery and scheduled for 31st.  On Apr 01, 2011, she underwent a left total hip replacement.  Please see dictated operative note for full details of the procedure.  After routine stay in the recovery room, she was transferred to orthopedic ward where she remained for the remainder of  hospital stay.  Her pain was almost immediately resolved following the surgery.  Postop day #1, her Foley catheter was removed and her Hemovac drain was removed.  She was seen and evaluated by Physical Therapy and was happy to be moving around without much pain.  On postop day #1, she did have a hematocrit 33.7.  Electrolytes remained stable.  On postop day #2, she had a hemoglobin of 9.1.  Electrolytes were stable.  She was here over the weekend waiting for discharge to nursing facility based on socially living alone.  By postop day #4, on April 05, 2011, she was ready for discharge.  Arrangement was made for [facility].  DISCHARGE CONDITION:  Stable and improved.  DISCHARGE INSTRUCTIONS:  The patient will be discharged to skilled nursing facility to work on range of motion, strengthening, gait training and her overall recovery.  She currently has an Aquacel dressing in place, which will be removed on April 09, 2011, and then have her wound covered with gauze and tape.  She may shower.  Any orthopedic questions as well as followup will be arranged at [facility] at office number [phone].  She will be discharged on Colace 100 mg p.o. b.i.d. for constipation  while on pain medicine, Lovenox 30 mg subcu b.i.d. for a total of 10 more days, hydrocodone 5/325 one to two tablets every 6 to 8 hours as needed for pain as she did not require a lot pain medicine, Robaxin 500 mg every 6 hours as needed for muscle spasm and pain, Senokot-S as needed for constipation, alprazolam 0.5 mg for anxiety b.i.d. as needed, Combivent 2 puffs q.6 h. p.r.n., Exforge 10/320 one tablet daily, fluticasone and salmeterol 250/50 disk inhaler 1 puff b.i.d. as needed.  Questions were encouraged, answered and reviewed prior to discharge. Our plan is to see her back in the office in 2 weeks' time.     Pietro Cassis Alvan Dame, M.D.     MDO/MEDQ  D:  04/05/2011  T:  04/05/2011  Job:   [id number]  Electronically Signed by Paralee Cancel M.D. on 04/12/2011 09:19:28 AM

## 2011-04-21 ENCOUNTER — Other Ambulatory Visit: Payer: Self-pay | Admitting: Internal Medicine

## 2011-04-21 ENCOUNTER — Encounter (HOSPITAL_BASED_OUTPATIENT_CLINIC_OR_DEPARTMENT_OTHER): Payer: Medicare Other | Admitting: Internal Medicine

## 2011-04-21 DIAGNOSIS — Z170 Estrogen receptor positive status [ER+]: Secondary | ICD-10-CM

## 2011-04-21 DIAGNOSIS — C7951 Secondary malignant neoplasm of bone: Secondary | ICD-10-CM

## 2011-04-21 DIAGNOSIS — C50219 Malignant neoplasm of upper-inner quadrant of unspecified female breast: Secondary | ICD-10-CM

## 2011-04-21 LAB — CBC WITH DIFFERENTIAL/PLATELET
BASO%: 0.3 % (ref 0.0–2.0)
EOS%: 2.5 % (ref 0.0–7.0)
HCT: 31.5 % — ABNORMAL LOW (ref 34.8–46.6)
MCH: 28.5 pg (ref 25.1–34.0)
MCHC: 32.7 g/dL (ref 31.5–36.0)
MCV: 87.2 fL (ref 79.5–101.0)
MONO%: 9.5 % (ref 0.0–14.0)
NEUT%: 66.6 % (ref 38.4–76.8)
lymph#: 1.1 10*3/uL (ref 0.9–3.3)

## 2011-04-21 LAB — COMPREHENSIVE METABOLIC PANEL
ALT: 9 U/L (ref 0–35)
AST: 10 U/L (ref 0–37)
Alkaline Phosphatase: 119 U/L — ABNORMAL HIGH (ref 39–117)
Calcium: 9.4 mg/dL (ref 8.4–10.5)
Chloride: 105 mEq/L (ref 96–112)
Creatinine, Ser: 0.86 mg/dL (ref 0.50–1.10)
Total Bilirubin: 0.3 mg/dL (ref 0.3–1.2)

## 2011-04-23 ENCOUNTER — Encounter: Payer: Self-pay | Admitting: Internal Medicine

## 2011-04-23 ENCOUNTER — Ambulatory Visit (INDEPENDENT_AMBULATORY_CARE_PROVIDER_SITE_OTHER): Payer: Medicare Other | Admitting: Internal Medicine

## 2011-04-23 DIAGNOSIS — R609 Edema, unspecified: Secondary | ICD-10-CM

## 2011-04-23 DIAGNOSIS — R202 Paresthesia of skin: Secondary | ICD-10-CM

## 2011-04-23 DIAGNOSIS — R209 Unspecified disturbances of skin sensation: Secondary | ICD-10-CM

## 2011-04-23 DIAGNOSIS — M25559 Pain in unspecified hip: Secondary | ICD-10-CM

## 2011-04-23 DIAGNOSIS — C50911 Malignant neoplasm of unspecified site of right female breast: Secondary | ICD-10-CM | POA: Insufficient documentation

## 2011-04-23 DIAGNOSIS — C7952 Secondary malignant neoplasm of bone marrow: Secondary | ICD-10-CM

## 2011-04-23 DIAGNOSIS — J45909 Unspecified asthma, uncomplicated: Secondary | ICD-10-CM

## 2011-04-23 DIAGNOSIS — C50919 Malignant neoplasm of unspecified site of unspecified female breast: Secondary | ICD-10-CM

## 2011-04-23 DIAGNOSIS — E559 Vitamin D deficiency, unspecified: Secondary | ICD-10-CM

## 2011-04-23 DIAGNOSIS — E119 Type 2 diabetes mellitus without complications: Secondary | ICD-10-CM

## 2011-04-23 NOTE — Assessment & Plan Note (Signed)
Now on Chemo and will start XRT

## 2011-04-23 NOTE — Progress Notes (Signed)
Subjective:    Patient ID: [name] [name], female    DOB: 02/11/1941, 70 y.o.   MRN: [id number]  HPI  The patient presents for a follow-up of  chronic hypertension, chronic dyslipidemia, type 2 diabetes controlled with medicines  F/u NH stay following L THR due to breast ca mets and a fx. Now is starting XRT. She had chemo IV, now on PO. It is her first day at home.  Review of Systems  Constitutional: Positive for fatigue and unexpected weight change. Negative for chills, activity change and appetite change.  HENT: Negative for congestion, mouth sores and sinus pressure.   Eyes: Negative for visual disturbance.  Respiratory: Negative for cough and chest tightness.   Gastrointestinal: Negative for nausea and abdominal pain.  Genitourinary: Negative for frequency, difficulty urinating and vaginal pain.  Musculoskeletal: Positive for gait problem (walker). Negative for back pain and joint swelling.  Skin: Negative for pallor and rash.  Neurological: Negative for dizziness, tremors, weakness, numbness and headaches.  Psychiatric/Behavioral: Negative for confusion, sleep disturbance and dysphoric mood.   Wt Readings from Last 3 Encounters:  04/23/11 216 lb (97.977 kg)  01/08/11 228 lb (103.42 kg)  11/03/10 226 lb (102.513 kg)       Objective:   Physical Exam  Constitutional: She appears well-developed. No distress.       Obese   HENT:  Head: Normocephalic.  Right Ear: External ear normal.  Left Ear: External ear normal.  Nose: Nose normal.  Mouth/Throat: Oropharynx is clear and moist.  Eyes: Conjunctivae are normal. Pupils are equal, round, and reactive to light. Right eye exhibits no discharge. Left eye exhibits no discharge.  Neck: Normal range of motion. Neck supple. No JVD present. No tracheal deviation present. No thyromegaly present.  Cardiovascular: Normal rate, regular rhythm and normal heart sounds.   Pulmonary/Chest: No stridor. No respiratory distress. She has no  wheezes.  Abdominal: Soft. Bowel sounds are normal. She exhibits no distension and no mass. There is no tenderness. There is no rebound and no guarding.  Musculoskeletal: She exhibits tenderness (L hip). She exhibits no edema.  Lymphadenopathy:    She has no cervical adenopathy.  Neurological: She displays normal reflexes. No cranial nerve deficit. She exhibits normal muscle tone. Coordination normal.  Skin: No rash noted. No erythema.  Psychiatric: She has a normal mood and affect. Her behavior is normal. Judgment and thought content normal.  L ankle 1+ edema, R trace Calves NT      Lab Results  Component Value Date   WBC 9.1 04/03/2011   HGB 10.3* 04/21/2011   HCT 31.5* 04/21/2011   PLT 404* 04/21/2011   CHOL  Value: 187        ATP III CLASSIFICATION:  <200     mg/dL   Desirable  200-239  mg/dL   Borderline High  >=240    mg/dL   High        02/14/2010   TRIG 61 02/14/2010   HDL 52 02/14/2010   ALT 9 04/21/2011   ALT 9 04/21/2011   AST 10 04/21/2011   AST 10 04/21/2011   NA 139 04/21/2011   NA 139 04/21/2011   K 3.8 04/21/2011   K 3.8 04/21/2011   CL 105 04/21/2011   CL 105 04/21/2011   CREATININE 0.86 04/21/2011   CREATININE 0.86 04/21/2011   BUN 6 04/21/2011   BUN 6 04/21/2011   CO2 24 04/21/2011   CO2 24 04/21/2011   TSH 1.916 **Test methodology  is 3rd generation TSH** 02/15/2010   INR 1.10 04/05/2011   HGBA1C  Value: 6.4 (NOTE)                                                                       According to the ADA Clinical Practice Recommendations for 2011, when HbA1c is used as a screening test:   >=6.5%   Diagnostic of Diabetes Mellitus           (if abnormal result  is confirmed)  5.7-6.4%   Increased risk of developing Diabetes Mellitus  References:Diagnosis and Classification of Diabetes Mellitus,Diabetes [id number] 1):S62-S69 and Standards of Medical Care in         Diabetes - 2011,Diabetes Care,2011,34  (Suppl 1):S11-S61.* 02/15/2010     Assessment & Plan:   A complex case.  Hosp records, labs,tests were reviewed.

## 2011-04-23 NOTE — Assessment & Plan Note (Signed)
Re-start Vit D 

## 2011-04-23 NOTE — Assessment & Plan Note (Signed)
Better since she quit smoking On Rx

## 2011-04-23 NOTE — Assessment & Plan Note (Signed)
On Rx 

## 2011-04-23 NOTE — Assessment & Plan Note (Signed)
Use elevation and compr socks

## 2011-04-27 ENCOUNTER — Encounter: Payer: Self-pay | Admitting: Internal Medicine

## 2011-04-28 ENCOUNTER — Ambulatory Visit: Payer: Medicare Other | Admitting: Radiation Oncology

## 2011-05-12 ENCOUNTER — Encounter (HOSPITAL_BASED_OUTPATIENT_CLINIC_OR_DEPARTMENT_OTHER): Payer: Medicare Other | Admitting: Internal Medicine

## 2011-05-12 ENCOUNTER — Other Ambulatory Visit: Payer: Self-pay | Admitting: Internal Medicine

## 2011-05-12 DIAGNOSIS — Z170 Estrogen receptor positive status [ER+]: Secondary | ICD-10-CM

## 2011-05-12 DIAGNOSIS — C7952 Secondary malignant neoplasm of bone marrow: Secondary | ICD-10-CM

## 2011-05-12 DIAGNOSIS — C50219 Malignant neoplasm of upper-inner quadrant of unspecified female breast: Secondary | ICD-10-CM

## 2011-05-12 DIAGNOSIS — C7951 Secondary malignant neoplasm of bone: Secondary | ICD-10-CM

## 2011-05-12 LAB — CBC WITH DIFFERENTIAL/PLATELET
BASO%: 0.5 % (ref 0.0–2.0)
Basophils Absolute: 0 10*3/uL (ref 0.0–0.1)
EOS%: 2.2 % (ref 0.0–7.0)
HCT: 35.4 % (ref 34.8–46.6)
HGB: 11.3 g/dL — ABNORMAL LOW (ref 11.6–15.9)
LYMPH%: 26 % (ref 14.0–49.7)
MCH: 27.2 pg (ref 25.1–34.0)
MCHC: 31.9 g/dL (ref 31.5–36.0)
MCV: 85.1 fL (ref 79.5–101.0)
MONO%: 12.1 % (ref 0.0–14.0)
NEUT%: 59.2 % (ref 38.4–76.8)
Platelets: 294 10*3/uL (ref 145–400)

## 2011-05-12 LAB — COMPREHENSIVE METABOLIC PANEL
AST: 14 U/L (ref 0–37)
Albumin: 4 g/dL (ref 3.5–5.2)
Alkaline Phosphatase: 102 U/L (ref 39–117)
BUN: 7 mg/dL (ref 6–23)
Creatinine, Ser: 0.97 mg/dL (ref 0.50–1.10)
Glucose, Bld: 114 mg/dL — ABNORMAL HIGH (ref 70–99)

## 2011-06-03 ENCOUNTER — Ambulatory Visit: Payer: Medicare Other | Attending: Orthopedic Surgery | Admitting: Physical Therapy

## 2011-06-03 DIAGNOSIS — Z96649 Presence of unspecified artificial hip joint: Secondary | ICD-10-CM | POA: Insufficient documentation

## 2011-06-03 DIAGNOSIS — IMO0001 Reserved for inherently not codable concepts without codable children: Secondary | ICD-10-CM | POA: Insufficient documentation

## 2011-06-03 DIAGNOSIS — R262 Difficulty in walking, not elsewhere classified: Secondary | ICD-10-CM | POA: Insufficient documentation

## 2011-06-03 DIAGNOSIS — M6281 Muscle weakness (generalized): Secondary | ICD-10-CM | POA: Insufficient documentation

## 2011-06-08 ENCOUNTER — Ambulatory Visit: Payer: Medicare Other | Admitting: Physical Therapy

## 2011-06-11 ENCOUNTER — Ambulatory Visit: Payer: Medicare Other | Admitting: Physical Therapy

## 2011-06-15 ENCOUNTER — Ambulatory Visit: Payer: Medicare Other | Admitting: Physical Therapy

## 2011-06-16 ENCOUNTER — Encounter (HOSPITAL_BASED_OUTPATIENT_CLINIC_OR_DEPARTMENT_OTHER): Payer: Medicare Other | Admitting: Internal Medicine

## 2011-06-16 ENCOUNTER — Other Ambulatory Visit: Payer: Self-pay | Admitting: Internal Medicine

## 2011-06-16 ENCOUNTER — Ambulatory Visit: Payer: Medicare Other | Admitting: Physical Therapy

## 2011-06-16 DIAGNOSIS — C7951 Secondary malignant neoplasm of bone: Secondary | ICD-10-CM

## 2011-06-16 DIAGNOSIS — C50219 Malignant neoplasm of upper-inner quadrant of unspecified female breast: Secondary | ICD-10-CM

## 2011-06-16 LAB — BASIC METABOLIC PANEL
CO2: 24 mEq/L (ref 19–32)
Chloride: 105 mEq/L (ref 96–112)
Potassium: 4 mEq/L (ref 3.5–5.3)

## 2011-06-18 ENCOUNTER — Telehealth: Payer: Self-pay | Admitting: *Deleted

## 2011-06-18 NOTE — Telephone Encounter (Signed)
Rec Rf req for Exforge HCT 10-320-25.  1 po qd # 90. I do not see med on list. Please advise ok to fill?

## 2011-06-21 NOTE — Telephone Encounter (Signed)
OK to fill this prescription with additional refills x3 Thank you!  

## 2011-06-22 ENCOUNTER — Ambulatory Visit: Payer: Medicare Other | Admitting: Physical Therapy

## 2011-06-22 MED ORDER — AMLODIPINE-VALSARTAN-HCTZ 10-320-25 MG PO TABS: 1.0000 | ORAL_TABLET | Freq: Every day | ORAL | Status: DC

## 2011-06-24 ENCOUNTER — Ambulatory Visit: Payer: Medicare Other | Admitting: Physical Therapy

## 2011-07-14 ENCOUNTER — Other Ambulatory Visit: Payer: Self-pay | Admitting: Internal Medicine

## 2011-07-14 ENCOUNTER — Encounter (HOSPITAL_BASED_OUTPATIENT_CLINIC_OR_DEPARTMENT_OTHER): Payer: Medicare Other | Admitting: Internal Medicine

## 2011-07-14 DIAGNOSIS — C7952 Secondary malignant neoplasm of bone marrow: Secondary | ICD-10-CM

## 2011-07-14 DIAGNOSIS — C50219 Malignant neoplasm of upper-inner quadrant of unspecified female breast: Secondary | ICD-10-CM

## 2011-07-14 LAB — BASIC METABOLIC PANEL
BUN: 17 mg/dL (ref 6–23)
Calcium: 9.1 mg/dL (ref 8.4–10.5)
Glucose, Bld: 101 mg/dL — ABNORMAL HIGH (ref 70–99)

## 2011-07-15 ENCOUNTER — Ambulatory Visit
Admission: RE | Admit: 2011-07-15 | Discharge: 2011-07-15 | Disposition: A | Discharge status: Home / Self Care | Payer: Medicare Other | Source: Ambulatory Visit | Attending: Radiation Oncology | Admitting: Radiation Oncology

## 2011-07-16 ENCOUNTER — Other Ambulatory Visit: Payer: Self-pay | Admitting: Radiation Oncology

## 2011-07-16 DIAGNOSIS — C50919 Malignant neoplasm of unspecified site of unspecified female breast: Secondary | ICD-10-CM

## 2011-07-23 ENCOUNTER — Encounter (HOSPITAL_COMMUNITY)
Admission: RE | Admit: 2011-07-23 | Discharge: 2011-07-23 | Disposition: A | Discharge status: Home / Self Care | Payer: Medicare Other | Source: Ambulatory Visit | Attending: Radiation Oncology | Admitting: Radiation Oncology

## 2011-07-23 DIAGNOSIS — C50919 Malignant neoplasm of unspecified site of unspecified female breast: Secondary | ICD-10-CM | POA: Insufficient documentation

## 2011-07-23 DIAGNOSIS — Z96649 Presence of unspecified artificial hip joint: Secondary | ICD-10-CM | POA: Insufficient documentation

## 2011-07-23 DIAGNOSIS — K449 Diaphragmatic hernia without obstruction or gangrene: Secondary | ICD-10-CM | POA: Insufficient documentation

## 2011-07-23 DIAGNOSIS — C7951 Secondary malignant neoplasm of bone: Secondary | ICD-10-CM | POA: Insufficient documentation

## 2011-07-23 DIAGNOSIS — C7952 Secondary malignant neoplasm of bone marrow: Secondary | ICD-10-CM | POA: Insufficient documentation

## 2011-07-23 DIAGNOSIS — K8020 Calculus of gallbladder without cholecystitis without obstruction: Secondary | ICD-10-CM | POA: Insufficient documentation

## 2011-07-23 LAB — GLUCOSE, CAPILLARY: Glucose-Capillary: 106 mg/dL — ABNORMAL HIGH (ref 70–99)

## 2011-07-23 MED ORDER — FLUDEOXYGLUCOSE F - 18 (FDG) INJECTION
18.2000 | Freq: Once | INTRAVENOUS | Status: AC | PRN
  Administered 2011-07-23: 18.2 via INTRAVENOUS

## 2011-07-29 ENCOUNTER — Ambulatory Visit: Payer: Medicare Other | Admitting: Radiation Oncology

## 2011-07-30 ENCOUNTER — Encounter: Payer: Self-pay | Admitting: Internal Medicine

## 2011-07-30 ENCOUNTER — Ambulatory Visit (INDEPENDENT_AMBULATORY_CARE_PROVIDER_SITE_OTHER): Payer: Medicare Other | Admitting: Internal Medicine

## 2011-07-30 VITALS — BP 140/70 | HR 76 | Temp 98.0°F | Resp 16 | Wt 203.0 lb

## 2011-07-30 DIAGNOSIS — J45909 Unspecified asthma, uncomplicated: Secondary | ICD-10-CM

## 2011-07-30 DIAGNOSIS — E559 Vitamin D deficiency, unspecified: Secondary | ICD-10-CM

## 2011-07-30 DIAGNOSIS — M79609 Pain in unspecified limb: Secondary | ICD-10-CM

## 2011-07-30 DIAGNOSIS — I10 Essential (primary) hypertension: Secondary | ICD-10-CM

## 2011-07-30 DIAGNOSIS — E119 Type 2 diabetes mellitus without complications: Secondary | ICD-10-CM

## 2011-07-30 NOTE — Progress Notes (Signed)
Subjective:    Patient ID: [name] [name], female    DOB: [birth year], 70 y.o.   MRN: [id number]  HPI  The patient presents for a follow-up of  chronic hypertension, chronic dyslipidemia, type 2 diabetes, breast ca controlled with medicines    Review of Systems  Constitutional: Negative for chills, activity change, appetite change, fatigue and unexpected weight change.  HENT: Negative for congestion, mouth sores and sinus pressure.   Eyes: Negative for visual disturbance.  Respiratory: Negative for cough and chest tightness.   Gastrointestinal: Negative for nausea and abdominal pain.  Genitourinary: Negative for frequency, difficulty urinating and vaginal pain.  Musculoskeletal: Positive for back pain (better) and arthralgias. Negative for gait problem.  Skin: Negative for pallor and rash.  Neurological: Negative for dizziness, tremors, weakness, numbness and headaches.  Psychiatric/Behavioral: Negative for suicidal ideas, confusion and sleep disturbance. The patient is nervous/anxious.        Objective:   Physical Exam  Constitutional: She appears well-developed. No distress.       obese  HENT:  Head: Normocephalic.  Right Ear: External ear normal.  Left Ear: External ear normal.  Nose: Nose normal.  Mouth/Throat: Oropharynx is clear and moist.  Eyes: Conjunctivae are normal. Pupils are equal, round, and reactive to light. Right eye exhibits no discharge. Left eye exhibits no discharge.  Neck: Normal range of motion. Neck supple. No JVD present. No tracheal deviation present. No thyromegaly present.  Cardiovascular: Normal rate, regular rhythm and normal heart sounds.   Pulmonary/Chest: No stridor. No respiratory distress. She has no wheezes.  Abdominal: Soft. Bowel sounds are normal. She exhibits no distension and no mass. There is no tenderness. There is no rebound and no guarding.  Musculoskeletal: She exhibits no edema and no tenderness.  Lymphadenopathy:    She has no  cervical adenopathy.  Neurological: She displays normal reflexes. No cranial nerve deficit. She exhibits normal muscle tone. Coordination (cane) abnormal.  Skin: No rash noted. No erythema.  Psychiatric: She has a normal mood and affect. Her behavior is normal. Judgment and thought content normal.     Lab Results  Component Value Date   WBC 9.1 04/03/2011   HGB 11.3* 05/12/2011   HCT 35.4 05/12/2011   PLT 294 05/12/2011   CHOL  Value: 187        ATP III CLASSIFICATION:  <200     mg/dL   Desirable  200-239  mg/dL   Borderline High  >=240    mg/dL   High        02/14/2010   TRIG 61 02/14/2010   HDL 52 02/14/2010   ALT 9 05/12/2011   AST 14 05/12/2011   NA 138 07/14/2011   K 3.7 07/14/2011   CL 102 07/14/2011   CREATININE 1.04 07/14/2011   BUN 17 07/14/2011   CO2 23 07/14/2011   TSH 1.916 **Test methodology is 3rd generation TSH** 02/15/2010   INR 1.10 04/05/2011   HGBA1C  Value: 6.4 (NOTE)                                                                       According to the ADA Clinical Practice Recommendations for 2011, when HbA1c is used as a screening test:   >=  6.5%   Diagnostic of Diabetes Mellitus           (if abnormal result  is confirmed)  5.7-6.4%   Increased risk of developing Diabetes Mellitus  References:Diagnosis and Classification of Diabetes Mellitus,Diabetes [id number] 1):S62-S69 and Standards of Medical Care in         Diabetes - 2011,Diabetes Care,2011,34  (Suppl 1):S11-S61.* 02/15/2010        Assessment & Plan:

## 2011-07-30 NOTE — Assessment & Plan Note (Signed)
Continue with current prescription therapy as reflected on the Med list.  

## 2011-07-30 NOTE — Assessment & Plan Note (Signed)
Better post-op 

## 2011-07-30 NOTE — Assessment & Plan Note (Signed)
Wt Readings from Last 3 Encounters:  07/30/11 203 lb (92.08 kg)  04/23/11 216 lb (97.977 kg)  01/08/11 228 lb (103.42 kg)  Continue with current prescription therapy as reflected on the Med list.

## 2011-08-03 LAB — POCT URINALYSIS DIP (DEVICE)
Glucose, UA: NEGATIVE mg/dL
Nitrite: NEGATIVE
Specific Gravity, Urine: 1.025 (ref 1.005–1.030)

## 2011-08-05 ENCOUNTER — Ambulatory Visit: Payer: Medicare Other | Admitting: Radiation Oncology

## 2011-08-11 ENCOUNTER — Encounter (HOSPITAL_BASED_OUTPATIENT_CLINIC_OR_DEPARTMENT_OTHER): Payer: Medicare Other | Admitting: Internal Medicine

## 2011-08-11 ENCOUNTER — Other Ambulatory Visit: Payer: Self-pay | Admitting: Internal Medicine

## 2011-08-11 DIAGNOSIS — C50219 Malignant neoplasm of upper-inner quadrant of unspecified female breast: Secondary | ICD-10-CM

## 2011-08-11 DIAGNOSIS — Z170 Estrogen receptor positive status [ER+]: Secondary | ICD-10-CM

## 2011-08-11 DIAGNOSIS — C7952 Secondary malignant neoplasm of bone marrow: Secondary | ICD-10-CM

## 2011-08-11 LAB — CBC WITH DIFFERENTIAL/PLATELET
Basophils Absolute: 0 10*3/uL (ref 0.0–0.1)
Eosinophils Absolute: 0.2 10*3/uL (ref 0.0–0.5)
HGB: 10.9 g/dL — ABNORMAL LOW (ref 11.6–15.9)
MCV: 81.9 fL (ref 79.5–101.0)
MONO#: 0.7 10*3/uL (ref 0.1–0.9)
MONO%: 12.1 % (ref 0.0–14.0)
NEUT#: 3.6 10*3/uL (ref 1.5–6.5)
Platelets: 233 10*3/uL (ref 145–400)
RDW: 16.1 % — ABNORMAL HIGH (ref 11.2–14.5)
WBC: 5.7 10*3/uL (ref 3.9–10.3)

## 2011-08-11 LAB — COMPREHENSIVE METABOLIC PANEL
ALT: 8 U/L (ref 0–35)
Albumin: 3.8 g/dL (ref 3.5–5.2)
CO2: 24 mEq/L (ref 19–32)
Calcium: 8.6 mg/dL (ref 8.4–10.5)
Chloride: 103 mEq/L (ref 96–112)
Glucose, Bld: 119 mg/dL — ABNORMAL HIGH (ref 70–99)
Sodium: 139 mEq/L (ref 135–145)
Total Bilirubin: 0.3 mg/dL (ref 0.3–1.2)
Total Protein: 6.9 g/dL (ref 6.0–8.3)

## 2011-08-11 LAB — CANCER ANTIGEN 27.29: CA 27.29: 32 U/mL (ref 0–39)

## 2011-08-12 ENCOUNTER — Other Ambulatory Visit: Payer: Self-pay | Admitting: Internal Medicine

## 2011-08-12 DIAGNOSIS — C50919 Malignant neoplasm of unspecified site of unspecified female breast: Secondary | ICD-10-CM

## 2011-08-25 ENCOUNTER — Ambulatory Visit (HOSPITAL_COMMUNITY)
Admission: RE | Admit: 2011-08-25 | Discharge: 2011-08-25 | Disposition: A | Discharge status: Home / Self Care | Payer: Medicare Other | Source: Ambulatory Visit | Attending: Internal Medicine | Admitting: Internal Medicine

## 2011-08-25 ENCOUNTER — Other Ambulatory Visit: Payer: Self-pay | Admitting: Internal Medicine

## 2011-08-25 DIAGNOSIS — C50919 Malignant neoplasm of unspecified site of unspecified female breast: Secondary | ICD-10-CM | POA: Insufficient documentation

## 2011-09-08 ENCOUNTER — Other Ambulatory Visit: Payer: Self-pay | Admitting: Internal Medicine

## 2011-09-08 DIAGNOSIS — C7951 Secondary malignant neoplasm of bone: Secondary | ICD-10-CM

## 2011-09-09 ENCOUNTER — Ambulatory Visit (HOSPITAL_BASED_OUTPATIENT_CLINIC_OR_DEPARTMENT_OTHER): Payer: Medicare Other

## 2011-09-09 ENCOUNTER — Other Ambulatory Visit: Payer: Self-pay | Admitting: Internal Medicine

## 2011-09-09 ENCOUNTER — Other Ambulatory Visit: Payer: Medicare Other

## 2011-09-09 VITALS — BP 142/71 | HR 72 | Temp 97.8°F

## 2011-09-09 DIAGNOSIS — C7952 Secondary malignant neoplasm of bone marrow: Secondary | ICD-10-CM

## 2011-09-09 DIAGNOSIS — C7951 Secondary malignant neoplasm of bone: Secondary | ICD-10-CM

## 2011-09-09 DIAGNOSIS — C50219 Malignant neoplasm of upper-inner quadrant of unspecified female breast: Secondary | ICD-10-CM

## 2011-09-09 LAB — BASIC METABOLIC PANEL
CO2: 22 mEq/L (ref 19–32)
Calcium: 8.8 mg/dL (ref 8.4–10.5)
Creatinine, Ser: 1.16 mg/dL — ABNORMAL HIGH (ref 0.50–1.10)
Sodium: 138 mEq/L (ref 135–145)

## 2011-09-09 MED ORDER — SODIUM CHLORIDE 0.9 % IJ SOLN
10.0000 mL | Freq: Once | INTRAMUSCULAR | Status: AC
  Administered 2011-09-09: 10 mL via INTRAVENOUS
  Filled 2011-09-09: qty 10

## 2011-09-09 MED ORDER — ZOLEDRONIC ACID 4 MG/100ML IV SOLN
4.0000 mg | Freq: Once | INTRAVENOUS | Status: DC
  Filled 2011-09-09: qty 100

## 2011-09-09 MED ORDER — SODIUM CHLORIDE 0.9 % IV SOLN
INTRAVENOUS | Status: DC
  Administered 2011-09-09: 15:00:00 via INTRAVENOUS

## 2011-09-09 MED ORDER — ZOLEDRONIC ACID 4 MG/100ML IV SOLN
4.0000 mg | Freq: Once | INTRAVENOUS | Status: AC
  Administered 2011-09-09: 4 mg via INTRAVENOUS
  Filled 2011-09-09: qty 100

## 2011-09-09 MED ORDER — HEPARIN SOD (PORK) LOCK FLUSH 100 UNIT/ML IV SOLN
500.0000 [IU] | Freq: Once | INTRAVENOUS | Status: AC
  Administered 2011-09-09: 500 [IU] via INTRAVENOUS
  Filled 2011-09-09: qty 5

## 2011-10-07 ENCOUNTER — Ambulatory Visit (HOSPITAL_BASED_OUTPATIENT_CLINIC_OR_DEPARTMENT_OTHER): Payer: Medicare Other

## 2011-10-07 ENCOUNTER — Other Ambulatory Visit: Payer: Medicare Other | Admitting: Lab

## 2011-10-07 ENCOUNTER — Other Ambulatory Visit: Payer: Self-pay | Admitting: Internal Medicine

## 2011-10-07 VITALS — BP 141/67 | HR 78 | Temp 98.3°F

## 2011-10-07 DIAGNOSIS — C50219 Malignant neoplasm of upper-inner quadrant of unspecified female breast: Secondary | ICD-10-CM

## 2011-10-07 DIAGNOSIS — C7951 Secondary malignant neoplasm of bone: Secondary | ICD-10-CM

## 2011-10-07 LAB — BASIC METABOLIC PANEL
CO2: 22 mEq/L (ref 19–32)
Chloride: 104 mEq/L (ref 96–112)
Potassium: 3.6 mEq/L (ref 3.5–5.3)
Sodium: 137 mEq/L (ref 135–145)

## 2011-10-07 MED ORDER — SODIUM CHLORIDE 0.9 % IJ SOLN
10.0000 mL | INTRAMUSCULAR | Status: DC | PRN
  Administered 2011-10-07: 10 mL via INTRAVENOUS
  Filled 2011-10-07: qty 10

## 2011-10-07 MED ORDER — ZOLEDRONIC ACID 4 MG/100ML IV SOLN
4.0000 mg | Freq: Once | INTRAVENOUS | Status: AC
  Administered 2011-10-07: 4 mg via INTRAVENOUS
  Filled 2011-10-07: qty 100

## 2011-10-07 MED ORDER — HEPARIN SOD (PORK) LOCK FLUSH 100 UNIT/ML IV SOLN
500.0000 [IU] | Freq: Once | INTRAVENOUS | Status: AC
  Administered 2011-10-07: 500 [IU] via INTRAVENOUS
  Filled 2011-10-07: qty 5

## 2011-10-07 NOTE — Patient Instructions (Signed)
1350 Pt verbalized understanding of next appt date/time

## 2011-10-11 ENCOUNTER — Other Ambulatory Visit: Payer: Self-pay | Admitting: Internal Medicine

## 2011-10-11 DIAGNOSIS — C50919 Malignant neoplasm of unspecified site of unspecified female breast: Secondary | ICD-10-CM

## 2011-10-30 ENCOUNTER — Telehealth: Payer: Self-pay | Admitting: Internal Medicine

## 2011-10-30 NOTE — Telephone Encounter (Signed)
S/w pt today re appts for 1/9 @ 1:45 pm. Pt to get new schedule when she comes in 1/9.

## 2011-11-05 ENCOUNTER — Ambulatory Visit (INDEPENDENT_AMBULATORY_CARE_PROVIDER_SITE_OTHER): Payer: Medicare Other | Admitting: Internal Medicine

## 2011-11-05 ENCOUNTER — Encounter: Payer: Self-pay | Admitting: Internal Medicine

## 2011-11-05 DIAGNOSIS — E119 Type 2 diabetes mellitus without complications: Secondary | ICD-10-CM | POA: Diagnosis not present

## 2011-11-05 DIAGNOSIS — I10 Essential (primary) hypertension: Secondary | ICD-10-CM | POA: Diagnosis not present

## 2011-11-05 DIAGNOSIS — F411 Generalized anxiety disorder: Secondary | ICD-10-CM

## 2011-11-05 MED ORDER — TRIAMCINOLONE ACETONIDE 0.5 % EX CREA: TOPICAL_CREAM | Freq: Two times a day (BID) | CUTANEOUS | Status: DC

## 2011-11-05 NOTE — Assessment & Plan Note (Signed)
Continue with current prescription therapy as reflected on the Med list.  

## 2011-11-05 NOTE — Progress Notes (Signed)
  Subjective:    Patient ID: [name] [name], female    DOB: Jul 08, 1941, 71 y.o.   MRN: [id number]  HPI  The patient presents for a follow-up of  chronic hypertension, chronic dyslipidemia, type 2 diabetes controlled with medicines  F/u Ca   Review of Systems  Constitutional: Negative for chills, activity change, appetite change, fatigue and unexpected weight change.  HENT: Negative for congestion, mouth sores and sinus pressure.   Eyes: Negative for visual disturbance.  Respiratory: Negative for cough and chest tightness.   Gastrointestinal: Negative for nausea and abdominal pain.  Genitourinary: Negative for frequency, difficulty urinating and vaginal pain.  Musculoskeletal: Positive for arthralgias and gait problem. Negative for back pain.  Skin: Negative for pallor and rash.  Neurological: Negative for dizziness, tremors, weakness, numbness and headaches.  Psychiatric/Behavioral: Negative for confusion and sleep disturbance.   Wt Readings from Last 3 Encounters:  11/05/11 211 lb (95.709 kg)  07/30/11 203 lb (92.08 kg)  04/23/11 216 lb (97.977 kg)       Objective:   Physical Exam  Constitutional: She appears well-developed. No distress.       obese  HENT:  Head: Normocephalic.  Right Ear: External ear normal.  Left Ear: External ear normal.  Nose: Nose normal.  Mouth/Throat: Oropharynx is clear and moist.  Eyes: Conjunctivae are normal. Pupils are equal, round, and reactive to light. Right eye exhibits no discharge. Left eye exhibits no discharge.  Neck: Normal range of motion. Neck supple. No JVD present. No tracheal deviation present. No thyromegaly present.  Cardiovascular: Normal rate, regular rhythm and normal heart sounds.   Pulmonary/Chest: No stridor. No respiratory distress. She has no wheezes.  Abdominal: Soft. Bowel sounds are normal. She exhibits no distension and no mass. There is no tenderness. There is no rebound and no guarding.  Musculoskeletal: She  exhibits no edema and no tenderness.  Lymphadenopathy:    She has no cervical adenopathy.  Neurological: She displays normal reflexes. No cranial nerve deficit. She exhibits normal muscle tone. Coordination normal.  Skin: No rash noted. No erythema.  Psychiatric: She has a normal mood and affect. Her behavior is normal. Judgment and thought content normal.          Assessment & Plan:

## 2011-11-10 ENCOUNTER — Other Ambulatory Visit: Payer: Self-pay | Admitting: Certified Registered Nurse Anesthetist

## 2011-11-10 ENCOUNTER — Ambulatory Visit (HOSPITAL_BASED_OUTPATIENT_CLINIC_OR_DEPARTMENT_OTHER): Payer: Medicare Other | Admitting: Physician Assistant

## 2011-11-10 ENCOUNTER — Ambulatory Visit (HOSPITAL_BASED_OUTPATIENT_CLINIC_OR_DEPARTMENT_OTHER): Payer: Medicare Other

## 2011-11-10 ENCOUNTER — Other Ambulatory Visit: Payer: Self-pay | Admitting: Internal Medicine

## 2011-11-10 ENCOUNTER — Other Ambulatory Visit (HOSPITAL_BASED_OUTPATIENT_CLINIC_OR_DEPARTMENT_OTHER): Payer: Medicare Other | Admitting: Lab

## 2011-11-10 DIAGNOSIS — C50219 Malignant neoplasm of upper-inner quadrant of unspecified female breast: Secondary | ICD-10-CM

## 2011-11-10 DIAGNOSIS — C7951 Secondary malignant neoplasm of bone: Secondary | ICD-10-CM | POA: Diagnosis not present

## 2011-11-10 DIAGNOSIS — C50919 Malignant neoplasm of unspecified site of unspecified female breast: Secondary | ICD-10-CM

## 2011-11-10 DIAGNOSIS — C7952 Secondary malignant neoplasm of bone marrow: Secondary | ICD-10-CM

## 2011-11-10 DIAGNOSIS — Z170 Estrogen receptor positive status [ER+]: Secondary | ICD-10-CM

## 2011-11-10 LAB — CANCER ANTIGEN 27.29: CA 27.29: 32 U/mL (ref 0–39)

## 2011-11-10 LAB — CBC WITH DIFFERENTIAL/PLATELET
Basophils Absolute: 0 10*3/uL (ref 0.0–0.1)
EOS%: 3 % (ref 0.0–7.0)
Eosinophils Absolute: 0.2 10*3/uL (ref 0.0–0.5)
HCT: 34.6 % — ABNORMAL LOW (ref 34.8–46.6)
HGB: 11.3 g/dL — ABNORMAL LOW (ref 11.6–15.9)
MCH: 29.1 pg (ref 25.1–34.0)
MCV: 89.2 fL (ref 79.5–101.0)
MONO%: 11.6 % (ref 0.0–14.0)
NEUT#: 2.9 10*3/uL (ref 1.5–6.5)
NEUT%: 57.6 % (ref 38.4–76.8)

## 2011-11-10 LAB — COMPREHENSIVE METABOLIC PANEL
Albumin: 3.5 g/dL (ref 3.5–5.2)
BUN: 15 mg/dL (ref 6–23)
CO2: 22 mEq/L (ref 19–32)
Calcium: 8.9 mg/dL (ref 8.4–10.5)
Glucose, Bld: 164 mg/dL — ABNORMAL HIGH (ref 70–99)
Potassium: 3.9 mEq/L (ref 3.5–5.3)
Sodium: 139 mEq/L (ref 135–145)
Total Protein: 7.4 g/dL (ref 6.0–8.3)

## 2011-11-10 MED ORDER — ANASTROZOLE 1 MG PO TABS: 1.0000 mg | ORAL_TABLET | Freq: Every day | ORAL | Status: DC

## 2011-11-10 MED ORDER — ZOLEDRONIC ACID 4 MG/100ML IV SOLN
4.0000 mg | Freq: Once | INTRAVENOUS | Status: AC
  Administered 2011-11-10: 4 mg via INTRAVENOUS
  Filled 2011-11-10: qty 100

## 2011-11-10 MED ORDER — ZOLEDRONIC ACID 4 MG/100ML IV SOLN
4.0000 mg | Freq: Once | INTRAVENOUS | Status: DC
  Filled 2011-11-10: qty 100

## 2011-11-10 NOTE — Patient Instructions (Signed)
Patient ambulatory out of clinic without complaints.  Instructed to call with any issues. Patient aware of next appointment

## 2011-11-15 NOTE — Progress Notes (Signed)
No images are attached to the encounter. No scans are attached to the encounter. No scans are attached to the encounter. [facility] OFFICE PROGRESS NOTE  Walker Kehr, MD, MD [place]. [facility] [place] [address]  DIAGNOSIS: Metastatic breast adenocarcinoma, presenting with bone metastasis in the left hip area. This was initially diagnosed as stage II (T2, N1, M0) invasive right breast carcinoma with positive estrogen and progesterone receptors and HER-2/neu positive in July 2002  PRIOR THERAPY: 1. Status post right lumpectomy with right axillary lymph node dissection revealing 4/9 lymph nodes that were positive for malignancy.  2. Status post 8 cycles of systemic chemotherapy with CMF, completed March 2003.  3. Status post radiotherapy to the remaining right breast under the care of Dr. Tammi Klippel.  4. Status post 2 years of treatment with tamoxifen, started July 2003, then switched to Femara by Dr. Humphrey Rolls in August 2005.  The patient discontinued the treatment in February 2009.  5. Status post left total hip replacement on Apr 01, 2011, secondary to pathologic left femoral neck fracture.  CURRENT THERAPY: 1. Arimidex 1 mg p.o. daily, started 03/18/2011.   2. Zometa 4 mg IV on a monthly basis.  INTERVAL HISTORY: [name] [name] 71 y.o. female returns for a scheduled regular office visit for followup of her metastatic breast adenocarcinoma. Overall she is doing well and reports she is no longer using a cane to help her walk. She continues to tolerate the Arimidex and request a prescription for 90 day supply. She does report some discomfort with her Port-A-Cath with movement. Her primary care physician gave her a Lidoderm patch sample which is providing some relief.   MEDICAL HISTORY: Past Medical History  Diagnosis Date  . Asthma   . Hypertension   . Diabetes mellitus type II   . Cerebrovascular accident     R thalamic CVA 01/2010  . Hyperlipemia     . Dyslipidemia   . TIA (transient ischemic attack)     x2 in 2011  . Low back pain   . Osteoarthritis   . Anxiety   . Breast cancer     2002; metastatic in 2012    ALLERGIES:  is allergic to atorvastatin.  MEDICATIONS:  Current Outpatient Prescriptions  Medication Sig Dispense Refill  . albuterol (VENTOLIN HFA) 108 (90 BASE) MCG/ACT inhaler Inhale 2 puffs into the lungs 4 (four) times daily.        Marland Kitchen ALPRAZolam (XANAX) 0.5 MG tablet Take 0.5 mg by mouth 2 (two) times daily.        Marland Kitchen amLODipine (NORVASC) 10 MG tablet Take 10 mg by mouth daily.        . Amlodipine-Valsartan-HCTZ (EXFORGE HCT) 10-320-25 MG TABS Take 1 tablet by mouth daily.  90 tablet  3  . anastrozole (ARIMIDEX) 1 MG tablet Take 1 tablet (1 mg total) by mouth daily.  90 tablet  1  . aspirin 325 MG tablet Take 325 mg by mouth daily.        . Cholecalciferol (VITAMIN D3) 1000 UNITS CAPS Take by mouth.        . loratadine (CLARITIN) 10 MG tablet Take 10 mg by mouth daily.        Marland Kitchen losartan-hydrochlorothiazide (HYZAAR) 100-25 MG per tablet Take 1 tablet by mouth daily.        Marland Kitchen spironolactone (ALDACTONE) 100 MG tablet Take 100 mg by mouth daily.        Marland Kitchen  traMADol (ULTRAM) 50 MG tablet Take 1 tablet (50 mg total) by mouth 2 (two) times daily as needed for pain.  60 tablet  2  . triamcinolone cream (KENALOG) 0.5 % Apply topically 2 (two) times daily.  60 g  3  . Fluticasone-Salmeterol (ADVAIR DISKUS) 250-50 MCG/DOSE AEPB Inhale 1 puff into the lungs as needed.        Marland Kitchen HYDROcodone-acetaminophen (NORCO) 5-325 MG per tablet Take 1 tablet by mouth 2 (two) times daily as needed.        . naproxen (NAPROSYN) 500 MG tablet Take 500 mg by mouth 2 (two) times daily with a meal.        . omeprazole (PRILOSEC) 40 MG capsule Take 40 mg by mouth daily.        . promethazine (PHENERGAN) 25 MG tablet Take 25 mg by mouth every 4 (four) hours as needed.        . rosuvastatin (CRESTOR) 10 MG tablet Take 10 mg by mouth daily.           SURGICAL HISTORY:  Past Surgical History  Procedure Date  . Breast lumpectomy     Right breast 2002  . Joint replacement     L hip due to breast ca met    REVIEW OF SYSTEMS:  A comprehensive review of systems was negative except for: Discomfort with her Port-A-Cath with movement   PHYSICAL EXAMINATION: General appearance: alert, cooperative, appears stated age and no distress Head: Normocephalic, without obvious abnormality, atraumatic Neck: no adenopathy, no carotid bruit, no JVD, supple, symmetrical, trachea midline and thyroid not enlarged, symmetric, no tenderness/mass/nodules Lymph nodes: Cervical, supraclavicular, and axillary nodes normal. Resp: clear to auscultation bilaterally Cardio: regular rate and rhythm, S1, S2 normal, no murmur, click, rub or gallop GI: soft, non-tender; bowel sounds normal; no masses,  no organomegaly Extremities: extremities normal, atraumatic, no cyanosis or edema Neurologic: Alert and oriented X 3, normal strength and tone. Normal symmetric reflexes. Normal coordination and gait  ECOG PERFORMANCE STATUS: 1 - Symptomatic but completely ambulatory  Blood pressure 146/78, pulse 84, temperature 97.2 F (36.2 C), temperature source Oral, height 5\' 4"  (1.626 m), weight 214 lb 14.4 oz (97.478 kg).  LABORATORY DATA: Lab Results  Component Value Date   WBC 5.1 11/10/2011   HGB 11.3* 11/10/2011   HCT 34.6* 11/10/2011   MCV 89.2 11/10/2011   PLT 220 11/10/2011      Chemistry      Component Value Date/Time   NA 139 11/10/2011 1358   K 3.9 11/10/2011 1358   CL 105 11/10/2011 1358   CO2 22 11/10/2011 1358   BUN 15 11/10/2011 1358   CREATININE 1.02 11/10/2011 1358      Component Value Date/Time   CALCIUM 8.9 11/10/2011 1358   ALKPHOS 85 11/10/2011 1358   AST 12 11/10/2011 1358   ALT 7 11/10/2011 1358   BILITOT 0.2* 11/10/2011 1358       RADIOGRAPHIC STUDIES:  No results found.   ASSESSMENT/PLAN: Is a very pleasant 71 year old African American female with  metastatic breast adenocarcinoma currently being treated with Arimidex and Zometa. The patient was discussed with Dr. Inda Merlin. She is currently doing well with stable disease. She'll followup with Dr. Inda Merlin in 3 months with a repeat CBC differential C. met and CA 27.29.     Carlton Adam, PA-C     All questions were answered. The patient knows to call the clinic with any problems, questions or concerns. We can certainly see the  patient much sooner if necessary.

## 2011-12-08 ENCOUNTER — Ambulatory Visit (HOSPITAL_BASED_OUTPATIENT_CLINIC_OR_DEPARTMENT_OTHER): Payer: Medicare Other

## 2011-12-08 ENCOUNTER — Other Ambulatory Visit: Payer: Medicare Other | Admitting: Lab

## 2011-12-08 VITALS — BP 133/73 | HR 81 | Temp 97.9°F

## 2011-12-08 DIAGNOSIS — C50919 Malignant neoplasm of unspecified site of unspecified female breast: Secondary | ICD-10-CM | POA: Diagnosis not present

## 2011-12-08 DIAGNOSIS — C7951 Secondary malignant neoplasm of bone: Secondary | ICD-10-CM | POA: Diagnosis not present

## 2011-12-08 DIAGNOSIS — C7952 Secondary malignant neoplasm of bone marrow: Secondary | ICD-10-CM

## 2011-12-08 LAB — BASIC METABOLIC PANEL
Calcium: 9.2 mg/dL (ref 8.4–10.5)
Sodium: 140 mEq/L (ref 135–145)

## 2011-12-08 MED ORDER — ZOLEDRONIC ACID 4 MG/100ML IV SOLN
4.0000 mg | Freq: Once | INTRAVENOUS | Status: AC
  Administered 2011-12-08: 4 mg via INTRAVENOUS
  Filled 2011-12-08: qty 100

## 2012-01-05 ENCOUNTER — Ambulatory Visit (HOSPITAL_BASED_OUTPATIENT_CLINIC_OR_DEPARTMENT_OTHER): Payer: Medicare Other

## 2012-01-05 ENCOUNTER — Other Ambulatory Visit: Payer: Medicare Other | Admitting: Lab

## 2012-01-05 VITALS — BP 167/78 | HR 84 | Temp 98.0°F

## 2012-01-05 DIAGNOSIS — C7951 Secondary malignant neoplasm of bone: Secondary | ICD-10-CM | POA: Diagnosis not present

## 2012-01-05 DIAGNOSIS — C801 Malignant (primary) neoplasm, unspecified: Secondary | ICD-10-CM

## 2012-01-05 DIAGNOSIS — C50919 Malignant neoplasm of unspecified site of unspecified female breast: Secondary | ICD-10-CM

## 2012-01-05 LAB — BASIC METABOLIC PANEL
CO2: 21 mEq/L (ref 19–32)
Calcium: 8.6 mg/dL (ref 8.4–10.5)
Creatinine, Ser: 0.96 mg/dL (ref 0.50–1.10)
Sodium: 141 mEq/L (ref 135–145)

## 2012-01-05 MED ORDER — ZOLEDRONIC ACID 4 MG/100ML IV SOLN
4.0000 mg | Freq: Once | INTRAVENOUS | Status: AC
  Administered 2012-01-05: 4 mg via INTRAVENOUS
  Filled 2012-01-05: qty 100

## 2012-02-02 ENCOUNTER — Ambulatory Visit (HOSPITAL_BASED_OUTPATIENT_CLINIC_OR_DEPARTMENT_OTHER): Payer: Medicare Other | Admitting: Internal Medicine

## 2012-02-02 ENCOUNTER — Other Ambulatory Visit: Payer: Medicare Other | Admitting: Lab

## 2012-02-02 ENCOUNTER — Telehealth: Payer: Self-pay | Admitting: Internal Medicine

## 2012-02-02 ENCOUNTER — Ambulatory Visit (HOSPITAL_BASED_OUTPATIENT_CLINIC_OR_DEPARTMENT_OTHER): Payer: Medicare Other

## 2012-02-02 VITALS — BP 152/87 | HR 85 | Temp 97.4°F | Ht 64.0 in | Wt 226.1 lb

## 2012-02-02 DIAGNOSIS — C7951 Secondary malignant neoplasm of bone: Secondary | ICD-10-CM

## 2012-02-02 DIAGNOSIS — C50919 Malignant neoplasm of unspecified site of unspecified female breast: Secondary | ICD-10-CM

## 2012-02-02 DIAGNOSIS — Z170 Estrogen receptor positive status [ER+]: Secondary | ICD-10-CM

## 2012-02-02 DIAGNOSIS — C7952 Secondary malignant neoplasm of bone marrow: Secondary | ICD-10-CM | POA: Diagnosis not present

## 2012-02-02 LAB — CBC WITH DIFFERENTIAL/PLATELET
Basophils Absolute: 0 10*3/uL (ref 0.0–0.1)
EOS%: 2.3 % (ref 0.0–7.0)
HCT: 37.5 % (ref 34.8–46.6)
HGB: 12.2 g/dL (ref 11.6–15.9)
MCH: 28.7 pg (ref 25.1–34.0)
MONO#: 0.7 10*3/uL (ref 0.1–0.9)
NEUT#: 3.7 10*3/uL (ref 1.5–6.5)
NEUT%: 65.7 % (ref 38.4–76.8)
RDW: 15.4 % — ABNORMAL HIGH (ref 11.2–14.5)
WBC: 5.7 10*3/uL (ref 3.9–10.3)
lymph#: 1.1 10*3/uL (ref 0.9–3.3)

## 2012-02-02 LAB — COMPREHENSIVE METABOLIC PANEL
Alkaline Phosphatase: 85 U/L (ref 39–117)
BUN: 17 mg/dL (ref 6–23)
CO2: 21 mEq/L (ref 19–32)
Creatinine, Ser: 1.16 mg/dL — ABNORMAL HIGH (ref 0.50–1.10)
Glucose, Bld: 109 mg/dL — ABNORMAL HIGH (ref 70–99)
Sodium: 137 mEq/L (ref 135–145)
Total Bilirubin: 0.3 mg/dL (ref 0.3–1.2)
Total Protein: 7.3 g/dL (ref 6.0–8.3)

## 2012-02-02 LAB — CANCER ANTIGEN 27.29: CA 27.29: 37 U/mL (ref 0–39)

## 2012-02-02 MED ORDER — HEPARIN SOD (PORK) LOCK FLUSH 100 UNIT/ML IV SOLN
500.0000 [IU] | Freq: Once | INTRAVENOUS | Status: AC
  Administered 2012-02-02: 500 [IU] via INTRAVENOUS
  Filled 2012-02-02: qty 5

## 2012-02-02 MED ORDER — SODIUM CHLORIDE 0.9 % IJ SOLN
10.0000 mL | INTRAMUSCULAR | Status: DC | PRN
  Administered 2012-02-02: 10 mL via INTRAVENOUS
  Filled 2012-02-02: qty 10

## 2012-02-02 MED ORDER — ZOLEDRONIC ACID 4 MG/100ML IV SOLN
4.0000 mg | Freq: Once | INTRAVENOUS | Status: AC
  Administered 2012-02-02: 4 mg via INTRAVENOUS
  Filled 2012-02-02: qty 100

## 2012-02-02 NOTE — Telephone Encounter (Signed)
gv pt appt for [id number]

## 2012-02-02 NOTE — Progress Notes (Signed)
[facility] [phone]   [phone]  OFFICE PROGRESS NOTE  Walker Kehr, MD, MD [facility] [place] [address]  DIAGNOSIS: Metastatic breast adenocarcinoma, presenting with bone metastasis in the left hip area. This was initially diagnosed as stage II (T2, N1, M0) invasive right breast carcinoma with positive estrogen and progesterone receptors and HER-2/neu positive in July 2002   PRIOR THERAPY:  1. Status post right lumpectomy with right axillary lymph node dissection revealing 4/9 lymph nodes that were positive for malignancy.  2. Status post 8 cycles of systemic chemotherapy with CMF, completed March 2003.  3. Status post radiotherapy to the remaining right breast under the care of Dr. Tammi Klippel.  4. Status post 2 years of treatment with tamoxifen, started July 2003, then switched to Femara by Dr. Humphrey Rolls in August 2005. The patient discontinued the treatment in February 2009.  5. Status post left total hip replacement on Apr 01, 2011, secondary to pathologic left femoral neck fracture.  CURRENT THERAPY:  1. Arimidex 1 mg p.o. daily, started 03/18/2011.  2. Zometa 4 mg IV on a monthly basis.  INTERVAL HISTORY: [name] [name] 71 y.o. female returns to the clinic today for a routine three-month followup visit. The patient has no complaints today except for generalized weakness and fatigue. She has been off her treatment with Arimidex for almost one month's because of financial reason. She did not call the clinic to inform us about her financial difficulties. She said that she would able to get it this month. She gained more than 20 pounds in the last few months. The patient denied having any other significant complaints. She has repeat CBC, comprehensive metabolic panel and CA [id number] performed today and she is here for evaluation and discussion of her lab results.  MEDICAL HISTORY: Past Medical History  Diagnosis Date   . Asthma   . Hypertension   . Diabetes mellitus type II   . Cerebrovascular accident     R thalamic CVA 01/2010  . Hyperlipemia   . Dyslipidemia   . TIA (transient ischemic attack)     x2 in 2011  . Low back pain   . Osteoarthritis   . Anxiety   . Breast cancer     2002; metastatic in 2012    ALLERGIES:  is allergic to atorvastatin.  MEDICATIONS:  Current Outpatient Prescriptions  Medication Sig Dispense Refill  . albuterol (VENTOLIN HFA) 108 (90 BASE) MCG/ACT inhaler Inhale 2 puffs into the lungs 4 (four) times daily.        Marland Kitchen ALPRAZolam (XANAX) 0.5 MG tablet Take 0.5 mg by mouth 2 (two) times daily.        Marland Kitchen amLODipine (NORVASC) 10 MG tablet Take 10 mg by mouth daily.        . Amlodipine-Valsartan-HCTZ (EXFORGE HCT) 10-320-25 MG TABS Take 1 tablet by mouth daily.  90 tablet  3  . anastrozole (ARIMIDEX) 1 MG tablet Take 1 tablet (1 mg total) by mouth daily.  90 tablet  1  . aspirin 325 MG tablet Take 325 mg by mouth daily.        . Cholecalciferol (VITAMIN D3) 1000 UNITS CAPS Take by mouth.        . Fluticasone-Salmeterol (ADVAIR DISKUS) 250-50 MCG/DOSE AEPB Inhale 1 puff into the lungs as needed.        Marland Kitchen HYDROcodone-acetaminophen (NORCO) 5-325 MG per tablet Take 1 tablet by mouth 2 (  two) times daily as needed.        . loratadine (CLARITIN) 10 MG tablet Take 10 mg by mouth daily.        Marland Kitchen losartan-hydrochlorothiazide (HYZAAR) 100-25 MG per tablet Take 1 tablet by mouth daily.        . naproxen (NAPROSYN) 500 MG tablet Take 500 mg by mouth 2 (two) times daily with a meal.        . omeprazole (PRILOSEC) 40 MG capsule Take 40 mg by mouth daily.        . promethazine (PHENERGAN) 25 MG tablet Take 25 mg by mouth every 4 (four) hours as needed.        . rosuvastatin (CRESTOR) 10 MG tablet Take 10 mg by mouth daily.        Marland Kitchen spironolactone (ALDACTONE) 100 MG tablet Take 100 mg by mouth daily.        . traMADol (ULTRAM) 50 MG tablet Take 1 tablet (50 mg total) by mouth 2 (two) times  daily as needed for pain.  60 tablet  2  . triamcinolone cream (KENALOG) 0.5 % Apply topically 2 (two) times daily.  60 g  3    SURGICAL HISTORY:  Past Surgical History  Procedure Date  . Breast lumpectomy     Right breast 2002  . Joint replacement     L hip due to breast ca met    REVIEW OF SYSTEMS:  A comprehensive review of systems was negative except for: Constitutional: positive for fatigue Musculoskeletal: positive for arthralgias and myalgias   PHYSICAL EXAMINATION: General appearance: alert, cooperative and no distress Neck: no adenopathy Resp: clear to auscultation bilaterally Cardio: regular rate and rhythm, S1, S2 normal, no murmur, click, rub or gallop GI: soft, non-tender; bowel sounds normal; no masses,  no organomegaly Extremities: extremities normal, atraumatic, no cyanosis or edema Neurologic: Alert and oriented X 3, normal strength and tone. Normal symmetric reflexes. Normal coordination and gait  ECOG PERFORMANCE STATUS: 1 - Symptomatic but completely ambulatory  Blood pressure 152/87, pulse 85, temperature 97.4 F (36.3 C), temperature source Oral, height 5\' 4"  (1.626 m), weight 226 lb 1.6 oz (102.558 kg).  LABORATORY DATA: Lab Results  Component Value Date   WBC 5.7 02/02/2012   HGB 12.2 02/02/2012   HCT 37.5 02/02/2012   MCV 88.4 02/02/2012   PLT 239 02/02/2012      Chemistry      Component Value Date/Time   NA 141 01/05/2012 1340   K 4.0 01/05/2012 1340   CL 109 01/05/2012 1340   CO2 21 01/05/2012 1340   BUN 12 01/05/2012 1340   CREATININE 0.96 01/05/2012 1340      Component Value Date/Time   CALCIUM 8.6 01/05/2012 1340   ALKPHOS 85 11/10/2011 1358   AST 12 11/10/2011 1358   ALT 7 11/10/2011 1358   BILITOT 0.2* 11/10/2011 1358       RADIOGRAPHIC STUDIES: No results found.  ASSESSMENT: This is a very pleasant 71 years old [place] American female with metastatic breast adenocarcinoma currently on treatment with Arimidex. Unfortunately the patient is noncompliant  with her medication and she was unable to take it for the last month because of financial issues.  PLAN: I strongly advise the patient to continue on her medication as prescribed. She is now able to pay for it. She will continue on Zometa monthly for her bone disease. I would see the patient back for followup visit in 3 months with repeat CT scan of  the chest, abdomen and pelvis for restaging of her disease. She was advised to call me immediately if she has any concerning symptoms in the interval.   All questions were answered. The patient knows to call the clinic with any problems, questions or concerns. We can certainly see the patient much sooner if necessary.

## 2012-02-08 ENCOUNTER — Ambulatory Visit: Payer: Medicare Other | Admitting: Internal Medicine

## 2012-02-09 ENCOUNTER — Ambulatory Visit: Payer: Medicare Other | Admitting: Internal Medicine

## 2012-02-11 ENCOUNTER — Ambulatory Visit (INDEPENDENT_AMBULATORY_CARE_PROVIDER_SITE_OTHER): Payer: Medicare Other | Admitting: Internal Medicine

## 2012-02-11 ENCOUNTER — Encounter: Payer: Self-pay | Admitting: Internal Medicine

## 2012-02-11 VITALS — BP 122/60 | HR 76 | Temp 97.6°F | Resp 16 | Wt 224.0 lb

## 2012-02-11 DIAGNOSIS — E559 Vitamin D deficiency, unspecified: Secondary | ICD-10-CM

## 2012-02-11 DIAGNOSIS — J45909 Unspecified asthma, uncomplicated: Secondary | ICD-10-CM

## 2012-02-11 DIAGNOSIS — R739 Hyperglycemia, unspecified: Secondary | ICD-10-CM

## 2012-02-11 DIAGNOSIS — I10 Essential (primary) hypertension: Secondary | ICD-10-CM

## 2012-02-11 DIAGNOSIS — N058 Unspecified nephritic syndrome with other morphologic changes: Secondary | ICD-10-CM

## 2012-02-11 DIAGNOSIS — F17200 Nicotine dependence, unspecified, uncomplicated: Secondary | ICD-10-CM

## 2012-02-11 DIAGNOSIS — R7309 Other abnormal glucose: Secondary | ICD-10-CM

## 2012-02-11 DIAGNOSIS — F411 Generalized anxiety disorder: Secondary | ICD-10-CM

## 2012-02-11 DIAGNOSIS — E785 Hyperlipidemia, unspecified: Secondary | ICD-10-CM

## 2012-02-11 DIAGNOSIS — M79609 Pain in unspecified limb: Secondary | ICD-10-CM

## 2012-02-11 DIAGNOSIS — E1329 Other specified diabetes mellitus with other diabetic kidney complication: Secondary | ICD-10-CM | POA: Diagnosis not present

## 2012-02-11 MED ORDER — AMLODIPINE BESYLATE 10 MG PO TABS: 10.0000 mg | ORAL_TABLET | Freq: Every day | ORAL | Status: DC

## 2012-02-11 MED ORDER — LOSARTAN POTASSIUM-HCTZ 100-25 MG PO TABS: 1.0000 | ORAL_TABLET | Freq: Every day | ORAL | Status: DC

## 2012-02-11 MED ORDER — TRIAMCINOLONE ACETONIDE 0.5 % EX CREA: TOPICAL_CREAM | Freq: Two times a day (BID) | CUTANEOUS | Status: DC

## 2012-02-11 NOTE — Assessment & Plan Note (Signed)
Continue with current prescription therapy as reflected on the Med list.  

## 2012-02-11 NOTE — Assessment & Plan Note (Signed)
Bone scan is pending w/Oncology

## 2012-02-11 NOTE — Assessment & Plan Note (Signed)
Dicusssed Stopped in 5/12

## 2012-02-11 NOTE — Progress Notes (Signed)
Patient ID: [name] [name], female   DOB: 1941/06/30, 71 y.o.   MRN: [id number]  Subjective:    Patient ID: [name] [name], female    DOB: [birth year], 71 y.o.   MRN: [id number]  Leg Pain  Pertinent negatives include no numbness.    The patient presents for a follow-up of  chronic hypertension, chronic dyslipidemia, type 2 diabetes controlled with medicines C/o LLE lateral pain F/u Cancer BP Readings from Last 3 Encounters:  02/11/12 122/60  02/02/12 152/87  01/05/12 167/78     Review of Systems  Constitutional: Negative for chills, activity change, appetite change, fatigue and unexpected weight change.  HENT: Negative for congestion, mouth sores and sinus pressure.   Eyes: Negative for visual disturbance.  Respiratory: Negative for cough and chest tightness.   Gastrointestinal: Negative for nausea and abdominal pain.  Genitourinary: Negative for frequency, difficulty urinating and vaginal pain.  Musculoskeletal: Positive for arthralgias and gait problem. Negative for back pain.  Skin: Negative for pallor and rash.  Neurological: Negative for dizziness, tremors, weakness, numbness and headaches.  Psychiatric/Behavioral: Negative for confusion and sleep disturbance.   Wt Readings from Last 3 Encounters:  02/11/12 224 lb (101.606 kg)  02/02/12 226 lb 1.6 oz (102.558 kg)  11/10/11 214 lb 14.4 oz (97.478 kg)       Objective:   Physical Exam  Constitutional: She appears well-developed. No distress.       obese  HENT:  Head: Normocephalic.  Right Ear: External ear normal.  Left Ear: External ear normal.  Nose: Nose normal.  Mouth/Throat: Oropharynx is clear and moist.  Eyes: Conjunctivae are normal. Pupils are equal, round, and reactive to light. Right eye exhibits no discharge. Left eye exhibits no discharge.  Neck: Normal range of motion. Neck supple. No JVD present. No tracheal deviation present. No thyromegaly present.  Cardiovascular: Normal rate, regular rhythm and  normal heart sounds.   Pulmonary/Chest: No stridor. No respiratory distress. She has no wheezes.  Abdominal: Soft. Bowel sounds are normal. She exhibits no distension and no mass. There is no tenderness. There is no rebound and no guarding.  Musculoskeletal: She exhibits no edema and no tenderness.  Lymphadenopathy:    She has no cervical adenopathy.  Neurological: She displays normal reflexes. No cranial nerve deficit. She exhibits normal muscle tone. Coordination normal.  Skin: No rash noted. No erythema.  Psychiatric: She has a normal mood and affect. Her behavior is normal. Judgment and thought content normal.     Lab Results  Component Value Date   WBC 5.7 02/02/2012   HGB 12.2 02/02/2012   HCT 37.5 02/02/2012   PLT 239 02/02/2012   GLUCOSE 109* 02/02/2012   CHOL  Value: 187        ATP III CLASSIFICATION:  <200     mg/dL   Desirable  200-239  mg/dL   Borderline High  >=240    mg/dL   High        02/14/2010   TRIG 61 02/14/2010   HDL 52 02/14/2010   LDLCALC  Value: 123        Total Cholesterol/HDL:CHD Risk Coronary Heart Disease Risk Table                     Men   Women  1/2 Average Risk   3.4   3.3  Average Risk       5.0   4.4  2 X Average Risk   9.6   7.1  3 X Average Risk  23.4   11.0        Use the calculated Patient Ratio above and the CHD Risk Table to determine the patient's CHD Risk.        ATP III CLASSIFICATION (LDL):  <100     mg/dL   Optimal  100-129  mg/dL   Near or Above                    Optimal  130-159  mg/dL   Borderline  160-189  mg/dL   High  >190     mg/dL   Very High* 02/14/2010   ALT 10 02/02/2012   AST 14 02/02/2012   NA 137 02/02/2012   K 4.0 02/02/2012   CL 101 02/02/2012   CREATININE 1.16* 02/02/2012   BUN 17 02/02/2012   CO2 21 02/02/2012   TSH 1.916 **Test methodology is 3rd generation TSH** 02/15/2010   INR 1.10 04/05/2011   HGBA1C  Value: 6.4 (NOTE)                                                                       According to the ADA Clinical Practice Recommendations for  2011, when HbA1c is used as a screening test:   >=6.5%   Diagnostic of Diabetes Mellitus           (if abnormal result  is confirmed)  5.7-6.4%   Increased risk of developing Diabetes Mellitus  References:Diagnosis and Classification of Diabetes Mellitus,Diabetes [id number] 1):S62-S69 and Standards of Medical Care in         Diabetes - 2011,Diabetes Care,2011,34  (Suppl 1):S11-S61.* 02/15/2010        Assessment & Plan:

## 2012-03-01 ENCOUNTER — Ambulatory Visit (HOSPITAL_BASED_OUTPATIENT_CLINIC_OR_DEPARTMENT_OTHER): Payer: Medicare Other

## 2012-03-01 ENCOUNTER — Other Ambulatory Visit: Payer: Self-pay | Admitting: Internal Medicine

## 2012-03-01 ENCOUNTER — Telehealth: Payer: Self-pay | Admitting: Internal Medicine

## 2012-03-01 VITALS — BP 162/64 | HR 75 | Temp 96.8°F

## 2012-03-01 DIAGNOSIS — C50919 Malignant neoplasm of unspecified site of unspecified female breast: Secondary | ICD-10-CM

## 2012-03-01 DIAGNOSIS — C7951 Secondary malignant neoplasm of bone: Secondary | ICD-10-CM | POA: Diagnosis not present

## 2012-03-01 DIAGNOSIS — C50219 Malignant neoplasm of upper-inner quadrant of unspecified female breast: Secondary | ICD-10-CM

## 2012-03-01 DIAGNOSIS — C7952 Secondary malignant neoplasm of bone marrow: Secondary | ICD-10-CM

## 2012-03-01 MED ORDER — ZOLEDRONIC ACID 4 MG/100ML IV SOLN
4.0000 mg | Freq: Once | INTRAVENOUS | Status: AC
  Administered 2012-03-01: 4 mg via INTRAVENOUS
  Filled 2012-03-01: qty 100

## 2012-03-01 MED ORDER — SODIUM CHLORIDE 0.9 % IV SOLN
Freq: Once | INTRAVENOUS | Status: AC
  Administered 2012-03-01: 12:00:00 via INTRAVENOUS

## 2012-03-01 MED ORDER — HEPARIN SOD (PORK) LOCK FLUSH 100 UNIT/ML IV SOLN
500.0000 [IU] | Freq: Once | INTRAVENOUS | Status: AC | PRN
  Administered 2012-03-01: 500 [IU]
  Filled 2012-03-01: qty 5

## 2012-03-01 MED ORDER — SODIUM CHLORIDE 0.9 % IJ SOLN
10.0000 mL | INTRAMUSCULAR | Status: DC | PRN
  Administered 2012-03-01: 10 mL
  Filled 2012-03-01: qty 10

## 2012-03-01 NOTE — Telephone Encounter (Signed)
called pt and she is aware of her 5/29 appt  aom

## 2012-03-29 ENCOUNTER — Ambulatory Visit (HOSPITAL_BASED_OUTPATIENT_CLINIC_OR_DEPARTMENT_OTHER): Payer: Medicare Other

## 2012-03-29 ENCOUNTER — Other Ambulatory Visit: Payer: Medicare Other | Admitting: Lab

## 2012-03-29 VITALS — BP 143/76 | HR 77

## 2012-03-29 DIAGNOSIS — C50919 Malignant neoplasm of unspecified site of unspecified female breast: Secondary | ICD-10-CM

## 2012-03-29 DIAGNOSIS — C7951 Secondary malignant neoplasm of bone: Secondary | ICD-10-CM

## 2012-03-29 LAB — BASIC METABOLIC PANEL
Calcium: 9.2 mg/dL (ref 8.4–10.5)
Glucose, Bld: 145 mg/dL — ABNORMAL HIGH (ref 70–99)
Sodium: 140 mEq/L (ref 135–145)

## 2012-03-29 MED ORDER — SODIUM CHLORIDE 0.9 % IV SOLN: Freq: Once | INTRAVENOUS | Status: DC

## 2012-03-29 MED ORDER — ZOLEDRONIC ACID 4 MG/100ML IV SOLN
4.0000 mg | Freq: Once | INTRAVENOUS | Status: AC
  Administered 2012-03-29: 4 mg via INTRAVENOUS
  Filled 2012-03-29: qty 100

## 2012-04-12 ENCOUNTER — Ambulatory Visit (INDEPENDENT_AMBULATORY_CARE_PROVIDER_SITE_OTHER): Payer: Medicare Other | Admitting: Internal Medicine

## 2012-04-12 ENCOUNTER — Other Ambulatory Visit: Payer: Self-pay | Admitting: Internal Medicine

## 2012-04-12 ENCOUNTER — Encounter: Payer: Self-pay | Admitting: Internal Medicine

## 2012-04-12 ENCOUNTER — Other Ambulatory Visit: Payer: Self-pay | Admitting: Cardiology

## 2012-04-12 ENCOUNTER — Ambulatory Visit (INDEPENDENT_AMBULATORY_CARE_PROVIDER_SITE_OTHER)
Admission: RE | Admit: 2012-04-12 | Discharge: 2012-04-12 | Disposition: A | Discharge status: Home / Self Care | Payer: Medicare Other | Source: Ambulatory Visit | Attending: Internal Medicine | Admitting: Internal Medicine

## 2012-04-12 ENCOUNTER — Encounter (INDEPENDENT_AMBULATORY_CARE_PROVIDER_SITE_OTHER): Payer: Medicare Other

## 2012-04-12 VITALS — BP 130/68 | HR 67 | Temp 97.9°F | Ht 64.0 in | Wt 232.2 lb

## 2012-04-12 DIAGNOSIS — I10 Essential (primary) hypertension: Secondary | ICD-10-CM

## 2012-04-12 DIAGNOSIS — R52 Pain, unspecified: Secondary | ICD-10-CM

## 2012-04-12 DIAGNOSIS — Z853 Personal history of malignant neoplasm of breast: Secondary | ICD-10-CM | POA: Diagnosis not present

## 2012-04-12 DIAGNOSIS — M79609 Pain in unspecified limb: Secondary | ICD-10-CM

## 2012-04-12 DIAGNOSIS — M25869 Other specified joint disorders, unspecified knee: Secondary | ICD-10-CM | POA: Diagnosis not present

## 2012-04-12 DIAGNOSIS — C50919 Malignant neoplasm of unspecified site of unspecified female breast: Secondary | ICD-10-CM | POA: Insufficient documentation

## 2012-04-12 DIAGNOSIS — M7989 Other specified soft tissue disorders: Secondary | ICD-10-CM

## 2012-04-12 DIAGNOSIS — F411 Generalized anxiety disorder: Secondary | ICD-10-CM | POA: Diagnosis not present

## 2012-04-12 DIAGNOSIS — M79604 Pain in right leg: Secondary | ICD-10-CM | POA: Insufficient documentation

## 2012-04-12 DIAGNOSIS — M25561 Pain in right knee: Secondary | ICD-10-CM

## 2012-04-12 DIAGNOSIS — M1710 Unilateral primary osteoarthritis, unspecified knee: Secondary | ICD-10-CM | POA: Diagnosis not present

## 2012-04-12 DIAGNOSIS — M25569 Pain in unspecified knee: Secondary | ICD-10-CM | POA: Diagnosis not present

## 2012-04-12 MED ORDER — HYDROCODONE-ACETAMINOPHEN 5-325 MG PO TABS: 1.0000 | ORAL_TABLET | Freq: Two times a day (BID) | ORAL | Status: DC | PRN

## 2012-04-12 NOTE — Progress Notes (Signed)
Subjective:    Patient ID: [name] [name], female    DOB: 02/15/1941, 71 y.o.   MRN: [id number]  HPI  Here with c/o 2-3 wks worsening mod to severe right knee and leg pain below - located anterolat prox aspect mostly without swelling, rash , gradually worse overall, worse to stand and walk, better to sit, and intermittent - sometimes has no pain at all,  No prior hx of this pain.  No knee swelling, new click, giveaway or falls.  Does have hx of breast ca metatstatic to left hip now s/p surgury, approx 1 yr, with last bone scan April 2012 on record neg in April just prior.  Getting some kind of monthly parenteral anti-breast cancer med, does not remember the name.  Pt denies chest pain, increased sob or doe, wheezing, orthopnea, PND, increased LE swelling, palpitations, dizziness or syncope.   Pt denies new neurological symptoms such as new headache, or facial or extremity weakness or numbness   Pt denies fever, wt loss, night sweats, loss of appetite, or other constitutional symptoms  No hx of DVT Past Medical History  Diagnosis Date  . Asthma   . Hypertension   . Diabetes mellitus type II   . Cerebrovascular accident     R thalamic CVA 01/2010  . Hyperlipemia   . Dyslipidemia   . TIA (transient ischemic attack)     x2 in 2011  . Low back pain   . Osteoarthritis   . Anxiety   . Breast cancer     2002; metastatic in 2012   Past Surgical History  Procedure Date  . Breast lumpectomy     Right breast 2002  . Joint replacement     L hip due to breast ca met    reports that she quit smoking about 13 months ago. She does not have any smokeless tobacco history on file. She reports that she does not drink alcohol or use illicit drugs. family history includes Hypertension in her father and mother. Allergies  Allergen Reactions  . Atorvastatin     REACTION: tired   Current Outpatient Prescriptions on File Prior to Visit  Medication Sig Dispense Refill  . albuterol (VENTOLIN HFA) 108 (90  BASE) MCG/ACT inhaler Inhale 2 puffs into the lungs 4 (four) times daily.        Marland Kitchen ALPRAZolam (XANAX) 0.5 MG tablet Take 0.5 mg by mouth 2 (two) times daily.        Marland Kitchen amLODipine (NORVASC) 10 MG tablet Take 1 tablet (10 mg total) by mouth daily.  90 tablet  3  . anastrozole (ARIMIDEX) 1 MG tablet Take 1 tablet (1 mg total) by mouth daily.  90 tablet  1  . aspirin 325 MG tablet Take 325 mg by mouth daily.        . Cholecalciferol (VITAMIN D3) 1000 UNITS CAPS Take by mouth.        . Fluticasone-Salmeterol (ADVAIR DISKUS) 250-50 MCG/DOSE AEPB Inhale 1 puff into the lungs as needed.        . loratadine (CLARITIN) 10 MG tablet Take 10 mg by mouth daily.        Marland Kitchen losartan-hydrochlorothiazide (HYZAAR) 100-25 MG per tablet Take 1 tablet by mouth daily.  90 tablet  3  . naproxen (NAPROSYN) 500 MG tablet Take 500 mg by mouth 2 (two) times daily with a meal.        . omeprazole (PRILOSEC) 40 MG capsule Take 40 mg by mouth daily.        Marland Kitchen  promethazine (PHENERGAN) 25 MG tablet Take 25 mg by mouth every 4 (four) hours as needed.        . rosuvastatin (CRESTOR) 10 MG tablet Take 10 mg by mouth daily.        Marland Kitchen spironolactone (ALDACTONE) 100 MG tablet Take 100 mg by mouth daily.        Marland Kitchen triamcinolone cream (KENALOG) 0.5 % Apply topically 2 (two) times daily.  60 g  3    Review of Systems Review of Systems  Constitutional: Negative for diaphoresis and unexpected weight change.    Eyes: Negative for photophobia and visual disturbance.  Respiratory: Negative for choking and stridor.   Gastrointestinal: Negative for vomiting and blood in stool.  Genitourinary: Negative for hematuria and decreased urine volume.  Musculoskeletal: + for limp with walking/antalgic gait Skin: Negative for color change and wound.  Neurological: Negative for tremors and numbness.  Psychiatric/Behavioral: Negative for decreased concentration. The patient is not hyperactive.      Objective:   Physical Exam BP 130/68  Pulse 67   Temp 97.9 F (36.6 C) (Oral)  Ht 5\' 4"  (1.626 m)  Wt 232 lb 4 oz (105.348 kg)  BMI 39.87 kg/m2  SpO2 98% Physical Exam  VS noted Constitutional: Pt appears well-developed and well-nourished.  HENT: Head: Normocephalic.  Right Ear: External ear normal.  Left Ear: External ear normal.  Eyes: Conjunctivae and EOM are normal. Pupils are equal, round, and reactive to light.  Neck: Normal range of motion. Neck supple.  Cardiovascular: Normal rate and regular rhythm.   Pulmonary/Chest: Effort normal and breath sounds normal.  Neurological: Pt is alert. Not confused Skin: Skin is warm. No erythema.  Right knee with mild bony DJD type change, but FROM, NT, no effusion RLE below the knee with tenderness to the area below the knee at the prox anterolat aspect, without rash, swelliing and RLE o/w neurovasc intact Psychiatric: Pt behavior is normal. Thought content normal. 1+ nervous    Assessment & Plan:

## 2012-04-12 NOTE — Assessment & Plan Note (Signed)
stable overall by hx and exam, most recent data reviewed with pt, and pt to continue medical treatment as before BP Readings from Last 3 Encounters:  04/12/12 130/68  03/29/12 143/76  03/01/12 162/64

## 2012-04-12 NOTE — Assessment & Plan Note (Signed)
Overview states "due to cancer met" but I dont see this on her record;  Will ask for knee and leg films, as well RLE venous doppler now to r/o DVT with minimal swelling today;  Will need f/u with oncology and PCP as planned, did refill her hydrocodone today, if eval neg may need repeat bone scan, and or ortho for right knee evalaution

## 2012-04-12 NOTE — Patient Instructions (Addendum)
Take all new medications as prescribed Continue all other medications as before Please go to XRAY in the Basement for the x-ray test You will be contacted regarding the referral for: leg venous doppler - to make sure no blood clot to the leg (to see the [facility] now) You will be contacted by phone if any changes need to be made immediately.  Otherwise, you will receive a letter about your results with an explanation. Please keep your appointments with your specialists as you have planned - oncology

## 2012-04-12 NOTE — Assessment & Plan Note (Signed)
stable overall by hx and exam, most recent data reviewed with pt - April labs, and pt to continue medical treatment as before

## 2012-04-13 ENCOUNTER — Telehealth: Payer: Self-pay

## 2012-04-13 NOTE — Telephone Encounter (Signed)
Message copied by Jamesetta Orleans on Thu Apr 13, 2012  8:16 AM ------      Message from: Biagio Borg      Created: Wed Apr 12, 2012  6:02 PM      Regarding: FW: prelim       Letter not sent for this            Maicee Ullman to let pt know - no blood clot to the right leg      ----- Message -----         From: Illene Silver         Sent: 04/12/2012   5:49 PM           To: Biagio Borg, MD      Subject: prelim                                                   Venous doppler reveals the right leg to be negative for acute DVT.      Thank you

## 2012-04-13 NOTE — Telephone Encounter (Signed)
Informed patient of results

## 2012-04-14 ENCOUNTER — Encounter: Payer: Self-pay | Admitting: Internal Medicine

## 2012-04-25 ENCOUNTER — Telehealth: Payer: Self-pay | Admitting: Medical Oncology

## 2012-04-25 NOTE — Telephone Encounter (Signed)
Pt called to confirm appointments-done

## 2012-04-26 ENCOUNTER — Other Ambulatory Visit: Payer: Medicare Other

## 2012-04-26 ENCOUNTER — Ambulatory Visit (HOSPITAL_BASED_OUTPATIENT_CLINIC_OR_DEPARTMENT_OTHER): Payer: Medicare Other

## 2012-04-26 VITALS — BP 113/64 | HR 75 | Temp 97.5°F

## 2012-04-26 DIAGNOSIS — C7951 Secondary malignant neoplasm of bone: Secondary | ICD-10-CM | POA: Diagnosis not present

## 2012-04-26 DIAGNOSIS — C50919 Malignant neoplasm of unspecified site of unspecified female breast: Secondary | ICD-10-CM

## 2012-04-26 DIAGNOSIS — C50219 Malignant neoplasm of upper-inner quadrant of unspecified female breast: Secondary | ICD-10-CM | POA: Diagnosis not present

## 2012-04-26 LAB — BASIC METABOLIC PANEL
BUN: 12 mg/dL (ref 6–23)
Creatinine, Ser: 1.06 mg/dL (ref 0.50–1.10)
Glucose, Bld: 96 mg/dL (ref 70–99)

## 2012-04-26 MED ORDER — SODIUM CHLORIDE 0.9 % IV SOLN
Freq: Once | INTRAVENOUS | Status: AC
  Administered 2012-04-26: 13:00:00 via INTRAVENOUS

## 2012-04-26 MED ORDER — HEPARIN SOD (PORK) LOCK FLUSH 100 UNIT/ML IV SOLN
500.0000 [IU] | Freq: Once | INTRAVENOUS | Status: AC | PRN
  Administered 2012-04-26: 500 [IU]
  Filled 2012-04-26: qty 5

## 2012-04-26 MED ORDER — SODIUM CHLORIDE 0.9 % IJ SOLN
10.0000 mL | INTRAMUSCULAR | Status: DC | PRN
  Administered 2012-04-26: 10 mL
  Filled 2012-04-26: qty 10

## 2012-04-26 MED ORDER — ZOLEDRONIC ACID 4 MG/100ML IV SOLN
4.0000 mg | Freq: Once | INTRAVENOUS | Status: AC
  Administered 2012-04-26: 4 mg via INTRAVENOUS
  Filled 2012-04-26: qty 100

## 2012-04-26 NOTE — Patient Instructions (Signed)
[facility] Discharge Instructions for Patients Receiving Chemotherapy  Today you received the following chemotherapy agents Zometa To help prevent nausea and vomiting after your treatment, we encourage you to take your nausea medication as prescribed. If you develop nausea and vomiting that is not controlled by your nausea medication, call the clinic. If it is after clinic hours your family physician or the after hours number for the clinic or go to the Emergency Department.   BELOW ARE SYMPTOMS THAT SHOULD BE REPORTED IMMEDIATELY:  *FEVER GREATER THAN 100.5 F  *CHILLS WITH OR WITHOUT FEVER  NAUSEA AND VOMITING THAT IS NOT CONTROLLED WITH YOUR NAUSEA MEDICATION  *UNUSUAL SHORTNESS OF BREATH  *UNUSUAL BRUISING OR BLEEDING  TENDERNESS IN MOUTH AND THROAT WITH OR WITHOUT PRESENCE OF ULCERS  *URINARY PROBLEMS  *BOWEL PROBLEMS  UNUSUAL RASH Items with * indicate a potential emergency and should be followed up as soon as possible.  One of the nurses will contact you 24 hours after your treatment. Please let the nurse know about any problems that you may have experienced. Feel free to call the clinic you have any questions or concerns. The clinic phone number is [phone].   I have been informed and understand all the instructions given to me. I know to contact the clinic, my physician, or go to the Emergency Department if any problems should occur. I do not have any questions at this time, but understand that I may call the clinic during office hours   should I have any questions or need assistance in obtaining follow up care.    __________________________________________  _____________  __________ Signature of Patient or Authorized Representative            Date                   Time    __________________________________________ Nurse's Signature

## 2012-05-03 ENCOUNTER — Other Ambulatory Visit (HOSPITAL_BASED_OUTPATIENT_CLINIC_OR_DEPARTMENT_OTHER): Payer: Medicare Other | Admitting: Lab

## 2012-05-03 ENCOUNTER — Ambulatory Visit (HOSPITAL_COMMUNITY)
Admission: RE | Admit: 2012-05-03 | Discharge: 2012-05-03 | Disposition: A | Discharge status: Home / Self Care | Payer: Medicare Other | Source: Ambulatory Visit | Attending: Internal Medicine | Admitting: Internal Medicine

## 2012-05-03 DIAGNOSIS — C7951 Secondary malignant neoplasm of bone: Secondary | ICD-10-CM | POA: Diagnosis not present

## 2012-05-03 DIAGNOSIS — C50919 Malignant neoplasm of unspecified site of unspecified female breast: Secondary | ICD-10-CM | POA: Insufficient documentation

## 2012-05-03 DIAGNOSIS — C7952 Secondary malignant neoplasm of bone marrow: Secondary | ICD-10-CM | POA: Insufficient documentation

## 2012-05-03 LAB — CMP (CANCER CENTER ONLY)
Albumin: 3.4 g/dL (ref 3.3–5.5)
Alkaline Phosphatase: 78 U/L (ref 26–84)
BUN, Bld: 14 mg/dL (ref 7–22)
Creat: 1.1 mg/dl (ref 0.6–1.2)
Glucose, Bld: 103 mg/dL (ref 73–118)
Potassium: 4.3 mEq/L (ref 3.3–4.7)
Total Bilirubin: 0.5 mg/dl (ref 0.20–1.60)

## 2012-05-03 LAB — CBC WITH DIFFERENTIAL/PLATELET
Basophils Absolute: 0 10*3/uL (ref 0.0–0.1)
Eosinophils Absolute: 0.2 10*3/uL (ref 0.0–0.5)
LYMPH%: 24.6 % (ref 14.0–49.7)
MCH: 29.1 pg (ref 25.1–34.0)
MCV: 89.6 fL (ref 79.5–101.0)
MONO%: 12.2 % (ref 0.0–14.0)
NEUT#: 3 10*3/uL (ref 1.5–6.5)
Platelets: 255 10*3/uL (ref 145–400)
RBC: 4.1 10*6/uL (ref 3.70–5.45)

## 2012-05-03 MED ORDER — IOHEXOL 300 MG/ML  SOLN
100.0000 mL | Freq: Once | INTRAMUSCULAR | Status: AC | PRN
  Administered 2012-05-03: 100 mL via INTRAVENOUS

## 2012-05-08 ENCOUNTER — Telehealth: Payer: Self-pay | Admitting: Internal Medicine

## 2012-05-08 ENCOUNTER — Ambulatory Visit (HOSPITAL_BASED_OUTPATIENT_CLINIC_OR_DEPARTMENT_OTHER): Payer: Medicare Other | Admitting: Internal Medicine

## 2012-05-08 VITALS — BP 146/79 | HR 73 | Temp 97.1°F | Ht 64.0 in | Wt 236.9 lb

## 2012-05-08 DIAGNOSIS — Z170 Estrogen receptor positive status [ER+]: Secondary | ICD-10-CM

## 2012-05-08 DIAGNOSIS — C50919 Malignant neoplasm of unspecified site of unspecified female breast: Secondary | ICD-10-CM

## 2012-05-08 DIAGNOSIS — C7951 Secondary malignant neoplasm of bone: Secondary | ICD-10-CM | POA: Diagnosis not present

## 2012-05-08 DIAGNOSIS — M1710 Unilateral primary osteoarthritis, unspecified knee: Secondary | ICD-10-CM | POA: Diagnosis not present

## 2012-05-08 NOTE — Telephone Encounter (Signed)
appts made and printed for pt aom °

## 2012-05-08 NOTE — Progress Notes (Signed)
[facility] [phone]   [phone]  OFFICE PROGRESS NOTE  Walker Kehr, MD [address] [facility] [place] [address]  DIAGNOSIS: Metastatic breast adenocarcinoma, presenting with bone metastasis in the left hip area. This was initially diagnosed as stage II (T2, N1, M0) invasive right breast carcinoma with positive estrogen and progesterone receptors and HER-2/neu positive in July 2002   PRIOR THERAPY:  1. Status post right lumpectomy with right axillary lymph node dissection revealing 4/9 lymph nodes that were positive for malignancy.  2. Status post 8 cycles of systemic chemotherapy with CMF, completed March 2003.  3. Status post radiotherapy to the remaining right breast under the care of Dr. Tammi Klippel.  4. Status post 2 years of treatment with tamoxifen, started July 2003, then switched to Femara by Dr. Humphrey Rolls in August 2005. The patient discontinued the treatment in February 2009.  5. Status post left total hip replacement on Apr 01, 2011, secondary to pathologic left femoral neck fracture.  CURRENT THERAPY:  1. Arimidex 1 mg p.o. daily, started 03/18/2011.  2. Zometa 4 mg IV on a monthly basis. 3.   INTERVAL HISTORY: LEYLANY HEVEY 71 y.o. female returns to the clinic today for three-month followup visit. The patient is doing fine except for arthritis in her knees bilaterally more on the right side. She denied having any significant weight loss or night sweats. She has no chest pain or shortness of breath. The patient is tolerating her treatment with Arimidex fairly well. She has repeat CT scan of the chest, abdomen and pelvis performed recently and she is here for evaluation and discussion of her scan results.  MEDICAL HISTORY: Past Medical History  Diagnosis Date  . Asthma   . Hypertension   . Diabetes mellitus type II   . Cerebrovascular accident     R thalamic CVA 01/2010  . Hyperlipemia   . Dyslipidemia   .  TIA (transient ischemic attack)     x2 in 2011  . Low back pain   . Osteoarthritis   . Anxiety   . Breast cancer     2002; metastatic in 2012    ALLERGIES:  is allergic to atorvastatin.  MEDICATIONS:  Current Outpatient Prescriptions  Medication Sig Dispense Refill  . albuterol (VENTOLIN HFA) 108 (90 BASE) MCG/ACT inhaler Inhale 2 puffs into the lungs 4 (four) times daily.        Marland Kitchen ALPRAZolam (XANAX) 0.5 MG tablet Take 0.5 mg by mouth 2 (two) times daily.        Marland Kitchen amLODipine (NORVASC) 10 MG tablet Take 1 tablet (10 mg total) by mouth daily.  90 tablet  3  . anastrozole (ARIMIDEX) 1 MG tablet Take 1 tablet (1 mg total) by mouth daily.  90 tablet  1  . aspirin 325 MG tablet Take 325 mg by mouth daily.        . Fluticasone-Salmeterol (ADVAIR DISKUS) 250-50 MCG/DOSE AEPB Inhale 1 puff into the lungs as needed.        . loratadine (CLARITIN) 10 MG tablet Take 10 mg by mouth daily.        Marland Kitchen losartan-hydrochlorothiazide (HYZAAR) 100-25 MG per tablet Take 1 tablet by mouth daily.  90 tablet  3  . naproxen (NAPROSYN) 500 MG tablet Take 500 mg by mouth 2 (two) times daily with a meal.        . omeprazole (PRILOSEC) 40 MG capsule Take 40 mg  by mouth daily.        . promethazine (PHENERGAN) 25 MG tablet Take 25 mg by mouth every 4 (four) hours as needed.        . rosuvastatin (CRESTOR) 10 MG tablet Take 10 mg by mouth daily.        Marland Kitchen spironolactone (ALDACTONE) 100 MG tablet Take 100 mg by mouth daily.        Marland Kitchen triamcinolone cream (KENALOG) 0.5 % Apply topically 2 (two) times daily.  60 g  3  . Cholecalciferol (VITAMIN D3) 1000 UNITS CAPS Take by mouth.        Marland Kitchen HYDROcodone-acetaminophen (NORCO) 5-325 MG per tablet Take 1 tablet by mouth 2 (two) times daily as needed.  40 tablet  1    SURGICAL HISTORY:  Past Surgical History  Procedure Date  . Breast lumpectomy     Right breast 2002  . Joint replacement     L hip due to breast ca met    REVIEW OF SYSTEMS:  A comprehensive review of  systems was negative except for: Constitutional: positive for fatigue Musculoskeletal: positive for arthralgias and myalgias   PHYSICAL EXAMINATION: General appearance: alert, cooperative and no distress Head: Normocephalic, without obvious abnormality, atraumatic Neck: no adenopathy Lymph nodes: Cervical, supraclavicular, and axillary nodes normal. Resp: clear to auscultation bilaterally Cardio: regular rate and rhythm, S1, S2 normal, no murmur, click, rub or gallop GI: soft, non-tender; bowel sounds normal; no masses,  no organomegaly Extremities: extremities normal, atraumatic, no cyanosis or edema Neurologic: Alert and oriented X 3, normal strength and tone. Normal symmetric reflexes. Normal coordination and gait  ECOG PERFORMANCE STATUS: 1 - Symptomatic but completely ambulatory  Blood pressure 146/79, pulse 73, temperature 97.1 F (36.2 C), temperature source Oral, height 5\' 4"  (1.626 m), weight 236 lb 14.4 oz (107.457 kg).  LABORATORY DATA: Lab Results  Component Value Date   WBC 5.2 05/03/2012   HGB 11.9 05/03/2012   HCT 36.7 05/03/2012   MCV 89.6 05/03/2012   PLT 255 05/03/2012      Chemistry      Component Value Date/Time   NA 140 05/03/2012 1449   NA 141 04/26/2012 1305   K 4.3 05/03/2012 1449   K 3.9 04/26/2012 1305   CL 99 05/03/2012 1449   CL 106 04/26/2012 1305   CO2 27 05/03/2012 1449   CO2 23 04/26/2012 1305   BUN 14 05/03/2012 1449   BUN 12 04/26/2012 1305   CREATININE 1.1 05/03/2012 1449   CREATININE 1.06 04/26/2012 1305      Component Value Date/Time   CALCIUM 9.1 05/03/2012 1449   CALCIUM 9.5 04/26/2012 1305   ALKPHOS 78 05/03/2012 1449   ALKPHOS 85 02/02/2012 1534   AST 19 05/03/2012 1449   AST 14 02/02/2012 1534   ALT 10 02/02/2012 1534   BILITOT 0.50 05/03/2012 1449   BILITOT 0.3 02/02/2012 1534       RADIOGRAPHIC STUDIES: Dg Tibia/fibula Right  04/12/2012  *RADIOLOGY REPORT*  Clinical Data:  Right lower leg pain, worsening, history metastatic breast cancer  RIGHT TIBIA AND  FIBULA - 2 VIEW  Comparison: None  Findings: Osseous mineralization normal. No acute fracture, dislocation or bone destruction. Soft tissues unremarkable. Knee excluded.  IMPRESSION: Normal exam.  Original Report Authenticated By: Burnetta Sabin, M.D.   Ct Chest W Contrast  05/03/2012  *RADIOLOGY REPORT*  Clinical Data:  Breast cancer diagnosed 2002.  Bone metastasis identified 2012.  Chemotherapy in progress.  CT CHEST, ABDOMEN AND  PELVIS WITH CONTRAST  Technique:  Multidetector CT imaging of the chest, abdomen and pelvis was performed following the standard protocol during bolus administration of intravenous contrast.  Contrast: 135mL OMNIPAQUE IOHEXOL 300 MG/ML  SOLN  Comparison:  PET CT scan 07/23/2011  CT CHEST  Findings:  There is a port in the left anterior chest wall.  No axillary or supraclavicular lymphadenopathy.  No mediastinal or hilar lymphadenopathy.  Review of the lung parenchyma  demonstrates no suspicious pulmonary nodules.  Airways are normal.  IMPRESSION: No evidence of breast cancer recurrence of thorax.  CT ABDOMEN AND PELVIS  Findings:  No focal hepatic lesion.  The pancreas, spleen, adrenal glands, and kidneys are normal.  Stomach, small bowel, appendix, and cecum are normal.  Colon and rectosigmoid colon are normal.  Abdominal aorta normal caliber.  No retroperitoneal or periportal lymphadenopathy.  No peritoneal disease.  No free fluid the pelvis. Post hysterectomy.  Bladder is normal. No pelvic lymphadenopathy.  Review of the bone windows demonstrates internal fixation of the left proximal femur.  No aggressive osseous lesions identified.  IMPRESSION: 1.  No evidence of metastasis in the abdomen or pelvis.  2.  No evidence of skeletal metastasis.  Original Report Authenticated By: Suzy Bouchard, M.D.   Dg Knee Complete 4 Views Right  04/12/2012  *RADIOLOGY REPORT*  Clinical Data: Worsening pain right knee and lower leg, history metastatic breast cancer  RIGHT KNEE - COMPLETE 4+ VIEW   Comparison: Right tibial and fibular radiographs of 04/12/2012  Findings: Osteoarthritic changes right knee with mild joint space narrowing and marginal spur formation. Osseous mineralization normal. No acute fracture, dislocation or bone destruction. No knee joint effusion.  IMPRESSION: No acute osseous abnormalities. Mild osteoarthritic changes right knee. If patient has persistent symptoms, consider radionuclide bone scan or MR imaging.  Original Report Authenticated By: Burnetta Sabin, M.D.    ASSESSMENT: This is a very pleasant 71 years old [place] American female with recurrent breast adenocarcinoma currently on treatment with Arimidex and Zometa. The patient is tolerating her treatment fairly well and she has no evidence for disease recurrence. X-ray of the right knee showed only osteoarthritis.  PLAN: I discussed the scan results with the patient. I recommended for her to continue her current treatment with Arimidex and Zometa. I would see her back for followup visit in 3 months with repeat CBC, comprehensive metabolic panel and CEA [id number]. She was advised to call me immediately if she has any concerning symptoms in the interval.  All questions were answered. The patient knows to call the clinic with any problems, questions or concerns. We can certainly see the patient much sooner if necessary.

## 2012-05-12 ENCOUNTER — Encounter: Payer: Self-pay | Admitting: Internal Medicine

## 2012-05-12 ENCOUNTER — Ambulatory Visit (INDEPENDENT_AMBULATORY_CARE_PROVIDER_SITE_OTHER): Payer: Medicare Other | Admitting: Internal Medicine

## 2012-05-12 VITALS — BP 138/64 | HR 72 | Temp 97.5°F | Resp 16 | Wt 233.0 lb

## 2012-05-12 DIAGNOSIS — R0600 Dyspnea, unspecified: Secondary | ICD-10-CM | POA: Insufficient documentation

## 2012-05-12 DIAGNOSIS — M79609 Pain in unspecified limb: Secondary | ICD-10-CM

## 2012-05-12 DIAGNOSIS — J45909 Unspecified asthma, uncomplicated: Secondary | ICD-10-CM

## 2012-05-12 DIAGNOSIS — K219 Gastro-esophageal reflux disease without esophagitis: Secondary | ICD-10-CM | POA: Insufficient documentation

## 2012-05-12 DIAGNOSIS — C50919 Malignant neoplasm of unspecified site of unspecified female breast: Secondary | ICD-10-CM | POA: Diagnosis not present

## 2012-05-12 DIAGNOSIS — F17200 Nicotine dependence, unspecified, uncomplicated: Secondary | ICD-10-CM

## 2012-05-12 DIAGNOSIS — C7952 Secondary malignant neoplasm of bone marrow: Secondary | ICD-10-CM

## 2012-05-12 DIAGNOSIS — R0609 Other forms of dyspnea: Secondary | ICD-10-CM

## 2012-05-12 DIAGNOSIS — C801 Malignant (primary) neoplasm, unspecified: Secondary | ICD-10-CM

## 2012-05-12 DIAGNOSIS — I10 Essential (primary) hypertension: Secondary | ICD-10-CM

## 2012-05-12 DIAGNOSIS — R609 Edema, unspecified: Secondary | ICD-10-CM

## 2012-05-12 DIAGNOSIS — E559 Vitamin D deficiency, unspecified: Secondary | ICD-10-CM

## 2012-05-12 DIAGNOSIS — C7951 Secondary malignant neoplasm of bone: Secondary | ICD-10-CM

## 2012-05-12 MED ORDER — OMEPRAZOLE 40 MG PO CPDR: 40.0000 mg | DELAYED_RELEASE_CAPSULE | Freq: Every day | ORAL | Status: DC

## 2012-05-12 NOTE — Assessment & Plan Note (Signed)
Continue with current prescription therapy as reflected on the Med list.  

## 2012-05-12 NOTE — Assessment & Plan Note (Signed)
Resolved

## 2012-05-12 NOTE — Assessment & Plan Note (Signed)
Restart Prilosec

## 2012-05-12 NOTE — Assessment & Plan Note (Signed)
7/13 due to deconditioning and wt issues Chest and abd CT was nl in 7/13

## 2012-05-12 NOTE — Assessment & Plan Note (Signed)
Stretch  

## 2012-05-12 NOTE — Assessment & Plan Note (Signed)
Stopped in 5/12

## 2012-05-12 NOTE — Assessment & Plan Note (Signed)
Chest and abd CT was nl in 7/13

## 2012-05-12 NOTE — Progress Notes (Signed)
Subjective:    Patient ID: [name] [name], female    DOB: May 07, 1941, 71 y.o.   MRN: [id number]  Shortness of Breath This is a chronic problem. The current episode started in the past 7 days. The problem occurs intermittently. The problem has been waxing and waning. Associated symptoms include leg pain. Pertinent negatives include no abdominal pain, chest pain, headaches, leg swelling or rash. Exacerbated by: walking. Her past medical history is significant for asthma.  No CP or chest pressure  The patient presents for a follow-up of  chronic hypertension, chronic dyslipidemia, type 2 diabetes controlled with medicines C/o RLE lateral pain F/u breast cancer w/mets C/o GERD- not using Prilosec  BP Readings from Last 3 Encounters:  05/12/12 138/64  05/08/12 146/79  04/26/12 113/64   Wt Readings from Last 3 Encounters:  05/12/12 233 lb (105.688 kg)  05/08/12 236 lb 14.4 oz (107.457 kg)  04/12/12 232 lb 4 oz (105.348 kg)      Review of Systems  Constitutional: Negative for chills, activity change, appetite change, fatigue and unexpected weight change.  HENT: Negative for congestion, mouth sores and sinus pressure.   Eyes: Negative for visual disturbance.  Respiratory: Positive for shortness of breath. Negative for cough and chest tightness.   Cardiovascular: Negative for chest pain, palpitations and leg swelling.  Gastrointestinal: Negative for nausea and abdominal pain.  Genitourinary: Negative for frequency, difficulty urinating and vaginal pain.  Musculoskeletal: Positive for arthralgias and gait problem. Negative for back pain.  Skin: Negative for pallor and rash.  Neurological: Negative for dizziness, tremors, weakness, numbness and headaches.  Psychiatric/Behavioral: Negative for confusion and disturbed wake/sleep cycle.  no black ctools      Objective:   Physical Exam  Constitutional: She appears well-developed. No distress.       obese  HENT:  Head:  Normocephalic.  Right Ear: External ear normal.  Left Ear: External ear normal.  Nose: Nose normal.  Mouth/Throat: Oropharynx is clear and moist.  Eyes: Conjunctivae are normal. Pupils are equal, round, and reactive to light. Right eye exhibits no discharge. Left eye exhibits no discharge.  Neck: Normal range of motion. Neck supple. No JVD present. No tracheal deviation present. No thyromegaly present.  Cardiovascular: Normal rate, regular rhythm and normal heart sounds.   Pulmonary/Chest: No stridor. No respiratory distress. She has no wheezes.  Abdominal: Soft. Bowel sounds are normal. She exhibits no distension and no mass. There is no tenderness. There is no rebound and no guarding.  Musculoskeletal: She exhibits no edema and no tenderness.  Lymphadenopathy:    She has no cervical adenopathy.  Neurological: She displays normal reflexes. No cranial nerve deficit. She exhibits normal muscle tone. Coordination normal.  Skin: No rash noted. No erythema.  Psychiatric: She has a normal mood and affect. Her behavior is normal. Judgment and thought content normal.  No palor   Lab Results  Component Value Date   WBC 5.7 02/02/2012   HGB 12.2 02/02/2012   HCT 37.5 02/02/2012   PLT 239 02/02/2012   GLUCOSE 109* 02/02/2012   CHOL  Value: 187        ATP III CLASSIFICATION:  <200     mg/dL   Desirable  200-239  mg/dL   Borderline High  >=240    mg/dL   High        02/14/2010   TRIG 61 02/14/2010   HDL 52 02/14/2010   LDLCALC  Value: 123        Total  Cholesterol/HDL:CHD Risk Coronary Heart Disease Risk Table                     Men   Women  1/2 Average Risk   3.4   3.3  Average Risk       5.0   4.4  2 X Average Risk   9.6   7.1  3 X Average Risk  23.4   11.0        Use the calculated Patient Ratio above and the CHD Risk Table to determine the patient's CHD Risk.        ATP III CLASSIFICATION (LDL):  <100     mg/dL   Optimal  100-129  mg/dL   Near or Above                    Optimal  130-159  mg/dL   Borderline   160-189  mg/dL   High  >190     mg/dL   Very High* 02/14/2010   ALT 10 02/02/2012   AST 14 02/02/2012   NA 137 02/02/2012   K 4.0 02/02/2012   CL 101 02/02/2012   CREATININE 1.16* 02/02/2012   BUN 17 02/02/2012   CO2 21 02/02/2012   TSH 1.916 **Test methodology is 3rd generation TSH** 02/15/2010   INR 1.10 04/05/2011   HGBA1C  Value: 6.4 (NOTE)                                                                       According to the ADA Clinical Practice Recommendations for 2011, when HbA1c is used as a screening test:   >=6.5%   Diagnostic of Diabetes Mellitus           (if abnormal result  is confirmed)  5.7-6.4%   Increased risk of developing Diabetes Mellitus  References:Diagnosis and Classification of Diabetes Mellitus,Diabetes [id number] 1):S62-S69 and Standards of Medical Care in         Diabetes - 2011,Diabetes Care,2011,34  (Suppl 1):S11-S61.* 02/15/2010        Assessment & Plan:

## 2012-05-24 ENCOUNTER — Ambulatory Visit (HOSPITAL_BASED_OUTPATIENT_CLINIC_OR_DEPARTMENT_OTHER): Payer: Medicare Other

## 2012-05-24 ENCOUNTER — Other Ambulatory Visit: Payer: Medicare Other | Admitting: Lab

## 2012-05-24 VITALS — BP 150/68 | HR 70 | Temp 97.0°F

## 2012-05-24 DIAGNOSIS — C7952 Secondary malignant neoplasm of bone marrow: Secondary | ICD-10-CM

## 2012-05-24 DIAGNOSIS — C50919 Malignant neoplasm of unspecified site of unspecified female breast: Secondary | ICD-10-CM

## 2012-05-24 DIAGNOSIS — C7951 Secondary malignant neoplasm of bone: Secondary | ICD-10-CM

## 2012-05-24 LAB — BASIC METABOLIC PANEL
BUN: 12 mg/dL (ref 6–23)
CO2: 25 mEq/L (ref 19–32)
Chloride: 105 mEq/L (ref 96–112)
Potassium: 4 mEq/L (ref 3.5–5.3)

## 2012-05-24 MED ORDER — SODIUM CHLORIDE 0.9 % IJ SOLN
10.0000 mL | INTRAMUSCULAR | Status: DC | PRN
  Filled 2012-05-24: qty 10

## 2012-05-24 MED ORDER — SODIUM CHLORIDE 0.9 % IV SOLN
Freq: Once | INTRAVENOUS | Status: AC
  Administered 2012-05-24: 20 mL via INTRAVENOUS

## 2012-05-24 MED ORDER — SODIUM CHLORIDE 0.9 % IJ SOLN
3.0000 mL | Freq: Once | INTRAMUSCULAR | Status: DC | PRN
  Filled 2012-05-24: qty 10

## 2012-05-24 MED ORDER — HEPARIN SOD (PORK) LOCK FLUSH 100 UNIT/ML IV SOLN
500.0000 [IU] | Freq: Once | INTRAVENOUS | Status: DC | PRN
  Filled 2012-05-24: qty 5

## 2012-05-24 MED ORDER — HEPARIN SOD (PORK) LOCK FLUSH 100 UNIT/ML IV SOLN
250.0000 [IU] | Freq: Once | INTRAVENOUS | Status: DC | PRN
  Filled 2012-05-24: qty 5

## 2012-05-24 MED ORDER — ZOLEDRONIC ACID 4 MG/100ML IV SOLN
4.0000 mg | Freq: Once | INTRAVENOUS | Status: AC
  Administered 2012-05-24: 4 mg via INTRAVENOUS
  Filled 2012-05-24: qty 100

## 2012-05-25 ENCOUNTER — Telehealth: Payer: Self-pay | Admitting: Medical Oncology

## 2012-05-25 NOTE — Telephone Encounter (Signed)
She asked  if she is supposed to get more zometa this year . Her last one was yesterday  And she has no other appointments. I do not see a schedule request for more infusion appointments.

## 2012-08-08 ENCOUNTER — Telehealth: Payer: Self-pay | Admitting: Internal Medicine

## 2012-08-08 ENCOUNTER — Other Ambulatory Visit (HOSPITAL_BASED_OUTPATIENT_CLINIC_OR_DEPARTMENT_OTHER): Payer: Medicare Other | Admitting: Lab

## 2012-08-08 ENCOUNTER — Telehealth: Payer: Self-pay | Admitting: *Deleted

## 2012-08-08 ENCOUNTER — Ambulatory Visit (HOSPITAL_BASED_OUTPATIENT_CLINIC_OR_DEPARTMENT_OTHER): Payer: Medicare Other

## 2012-08-08 ENCOUNTER — Ambulatory Visit (HOSPITAL_BASED_OUTPATIENT_CLINIC_OR_DEPARTMENT_OTHER): Payer: Medicare Other | Admitting: Internal Medicine

## 2012-08-08 VITALS — BP 150/76 | HR 76 | Temp 97.8°F | Resp 18 | Wt 241.0 lb

## 2012-08-08 DIAGNOSIS — Z170 Estrogen receptor positive status [ER+]: Secondary | ICD-10-CM | POA: Diagnosis not present

## 2012-08-08 DIAGNOSIS — C7951 Secondary malignant neoplasm of bone: Secondary | ICD-10-CM

## 2012-08-08 DIAGNOSIS — C50919 Malignant neoplasm of unspecified site of unspecified female breast: Secondary | ICD-10-CM | POA: Diagnosis not present

## 2012-08-08 DIAGNOSIS — C50119 Malignant neoplasm of central portion of unspecified female breast: Secondary | ICD-10-CM

## 2012-08-08 DIAGNOSIS — C7952 Secondary malignant neoplasm of bone marrow: Secondary | ICD-10-CM

## 2012-08-08 LAB — CBC WITH DIFFERENTIAL/PLATELET
Basophils Absolute: 0 10*3/uL (ref 0.0–0.1)
Eosinophils Absolute: 0.1 10*3/uL (ref 0.0–0.5)
LYMPH%: 19.1 % (ref 14.0–49.7)
MCH: 28.8 pg (ref 25.1–34.0)
MCV: 87.9 fL (ref 79.5–101.0)
MONO%: 10.7 % (ref 0.0–14.0)
NEUT#: 3.9 10*3/uL (ref 1.5–6.5)
Platelets: 274 10*3/uL (ref 145–400)
RBC: 4.12 10*6/uL (ref 3.70–5.45)

## 2012-08-08 LAB — COMPREHENSIVE METABOLIC PANEL (CC13)
CO2: 23 mEq/L (ref 22–29)
Creatinine: 1.1 mg/dL (ref 0.6–1.1)
Glucose: 91 mg/dl (ref 70–99)
Total Bilirubin: 0.3 mg/dL (ref 0.20–1.20)

## 2012-08-08 MED ORDER — ZOLEDRONIC ACID 4 MG/5ML IV CONC
4.0000 mg | Freq: Once | INTRAVENOUS | Status: AC
  Administered 2012-08-08: 4 mg via INTRAVENOUS
  Filled 2012-08-08: qty 5

## 2012-08-08 MED ORDER — SODIUM CHLORIDE 0.9 % IV SOLN
Freq: Once | INTRAVENOUS | Status: AC
  Administered 2012-08-08: 15:00:00 via INTRAVENOUS

## 2012-08-08 NOTE — Telephone Encounter (Signed)
appts made and email to mw to add tx and print for pt  aom

## 2012-08-08 NOTE — Telephone Encounter (Signed)
Per staff phone message and POF I have scheduled appts. JMW

## 2012-08-08 NOTE — Progress Notes (Signed)
[facility] [phone]   [phone]  OFFICE PROGRESS NOTE  Walker Kehr, MD [address] [facility] [place] [address]  DIAGNOSIS: Metastatic breast adenocarcinoma, presenting with bone metastasis in the left hip area. This was initially diagnosed as stage II (T2, N1, M0) invasive right breast carcinoma with positive estrogen and progesterone receptors and HER-2/neu positive in July 2002   PRIOR THERAPY:  1. Status post right lumpectomy with right axillary lymph node dissection revealing 4/9 lymph nodes that were positive for malignancy.  2. Status post 8 cycles of systemic chemotherapy with CMF, completed March 2003.  3. Status post radiotherapy to the remaining right breast under the care of Dr. Tammi Klippel.  4. Status post 2 years of treatment with tamoxifen, started July 2003, then switched to Femara by Dr. Humphrey Rolls in August 2005. The patient discontinued the treatment in February 2009.  5. Status post left total hip replacement on Apr 01, 2011, secondary to pathologic left femoral neck fracture.  CURRENT THERAPY:  1. Arimidex 1 mg p.o. daily, started 03/18/2011.  2. Zometa 4 mg IV on a monthly basis.  INTERVAL HISTORY: [name] [name] 71 y.o. female returns to the clinic today for routine three-month followup visit. The patient is feeling fine today with no specific complaints except for some stiffness in the left hip area especially when she starts moving from a sitting position. She denied having any significant chest pain or shortness breath, no cough or hemoptysis. She denied having any significant weight loss or night sweats. She is tolerating her treatment with Arimidex fairly well with no significant adverse effects. The patient has repeat CBC, comprehensive metabolic panel and CA [id number] performed earlier today and she is here for evaluation and discussion of her lab results.  MEDICAL HISTORY: Past Medical History    Diagnosis Date  . Asthma   . Hypertension   . Diabetes mellitus type II   . Cerebrovascular accident     R thalamic CVA 01/2010  . Hyperlipemia   . Dyslipidemia   . TIA (transient ischemic attack)     x2 in 2011  . Low back pain   . Osteoarthritis   . Anxiety   . Breast cancer     2002; metastatic in 2012    ALLERGIES:  is allergic to atorvastatin.  MEDICATIONS:  Current Outpatient Prescriptions  Medication Sig Dispense Refill  . albuterol (VENTOLIN HFA) 108 (90 BASE) MCG/ACT inhaler Inhale 2 puffs into the lungs 4 (four) times daily.        Marland Kitchen ALPRAZolam (XANAX) 0.5 MG tablet Take 0.5 mg by mouth 2 (two) times daily.        Marland Kitchen amLODipine (NORVASC) 10 MG tablet Take 1 tablet (10 mg total) by mouth daily.  90 tablet  3  . anastrozole (ARIMIDEX) 1 MG tablet Take 1 tablet (1 mg total) by mouth daily.  90 tablet  1  . aspirin 325 MG tablet Take 325 mg by mouth daily.        . Cholecalciferol (VITAMIN D3) 1000 UNITS CAPS Take by mouth.        . Fluticasone-Salmeterol (ADVAIR DISKUS) 250-50 MCG/DOSE AEPB Inhale 1 puff into the lungs as needed.        Marland Kitchen HYDROcodone-acetaminophen (NORCO) 5-325 MG per tablet Take 1 tablet by mouth 2 (two) times daily as needed.  40 tablet  1  . loratadine (CLARITIN) 10 MG tablet Take 10 mg by  mouth daily.        Marland Kitchen losartan-hydrochlorothiazide (HYZAAR) 100-25 MG per tablet Take 1 tablet by mouth daily.  90 tablet  3  . naproxen (NAPROSYN) 500 MG tablet Take 500 mg by mouth 2 (two) times daily with a meal.        . omeprazole (PRILOSEC) 40 MG capsule Take 1 capsule (40 mg total) by mouth daily.  30 capsule  11  . rosuvastatin (CRESTOR) 10 MG tablet Take 10 mg by mouth daily.        Marland Kitchen triamcinolone cream (KENALOG) 0.5 % Apply topically 2 (two) times daily.  60 g  3  . promethazine (PHENERGAN) 25 MG tablet Take 25 mg by mouth every 4 (four) hours as needed.        Marland Kitchen spironolactone (ALDACTONE) 100 MG tablet Take 100 mg by mouth daily.          SURGICAL  HISTORY:  Past Surgical History  Procedure Date  . Breast lumpectomy     Right breast 2002  . Joint replacement     L hip due to breast ca met    REVIEW OF SYSTEMS:  A comprehensive review of systems was negative except for: Musculoskeletal: positive for stiff joints   PHYSICAL EXAMINATION: General appearance: alert, cooperative and no distress Head: Normocephalic, without obvious abnormality, atraumatic Neck: no adenopathy Lymph nodes: Cervical, supraclavicular, and axillary nodes normal. Resp: clear to auscultation bilaterally Cardio: regular rate and rhythm, S1, S2 normal, no murmur, click, rub or gallop GI: soft, non-tender; bowel sounds normal; no masses,  no organomegaly Extremities: extremities normal, atraumatic, no cyanosis or edema Neurologic: Alert and oriented X 3, normal strength and tone. Normal symmetric reflexes. Normal coordination and gait  ECOG PERFORMANCE STATUS: 1 - Symptomatic but completely ambulatory  Blood pressure 150/76, pulse 76, temperature 97.8 F (36.6 C), temperature source Oral, resp. rate 18, weight 241 lb (109.317 kg).  LABORATORY DATA: Lab Results  Component Value Date   WBC 5.7 08/08/2012   HGB 11.9 08/08/2012   HCT 36.2 08/08/2012   MCV 87.9 08/08/2012   PLT 274 08/08/2012      Chemistry      Component Value Date/Time   NA 139 05/24/2012 1221   NA 140 05/03/2012 1449   K 4.0 05/24/2012 1221   K 4.3 05/03/2012 1449   CL 105 05/24/2012 1221   CL 99 05/03/2012 1449   CO2 25 05/24/2012 1221   CO2 27 05/03/2012 1449   BUN 12 05/24/2012 1221   BUN 14 05/03/2012 1449   CREATININE 1.05 05/24/2012 1221   CREATININE 1.1 05/03/2012 1449      Component Value Date/Time   CALCIUM 9.0 05/24/2012 1221   CALCIUM 9.1 05/03/2012 1449   ALKPHOS 78 05/03/2012 1449   ALKPHOS 85 02/02/2012 1534   AST 19 05/03/2012 1449   AST 14 02/02/2012 1534   ALT 10 02/02/2012 1534   BILITOT 0.50 05/03/2012 1449   BILITOT 0.3 02/02/2012 1534       RADIOGRAPHIC STUDIES: No results  found.  ASSESSMENT: This is a very pleasant 71 years old African American female with metastatic breast adenocarcinoma currently on hormonal therapy with Arimidex 1 mg by mouth daily. The patient is tolerating her treatment fairly well.  PLAN: We'll continue with the current hormonal therapy as scheduled. The patient would also resume her treatment with Zometa 4 mg IV on monthly basis. She would come back for followup visit in 3 months for reevaluation with repeat blood work.  She was advised to call me immediately if she has any concerning symptoms in the interval.   All questions were answered. The patient knows to call the clinic with any problems, questions or concerns. We can certainly see the patient much sooner if necessary.

## 2012-08-09 LAB — CANCER ANTIGEN 27.29: CA 27.29: 40 U/mL — ABNORMAL HIGH (ref 0–39)

## 2012-08-25 ENCOUNTER — Encounter: Payer: Self-pay | Admitting: Internal Medicine

## 2012-08-25 ENCOUNTER — Ambulatory Visit (INDEPENDENT_AMBULATORY_CARE_PROVIDER_SITE_OTHER): Payer: Medicare Other | Admitting: Internal Medicine

## 2012-08-25 VITALS — BP 130/80 | HR 80 | Temp 96.9°F | Resp 16 | Wt 236.0 lb

## 2012-08-25 DIAGNOSIS — R635 Abnormal weight gain: Secondary | ICD-10-CM

## 2012-08-25 DIAGNOSIS — E785 Hyperlipidemia, unspecified: Secondary | ICD-10-CM

## 2012-08-25 DIAGNOSIS — F411 Generalized anxiety disorder: Secondary | ICD-10-CM

## 2012-08-25 DIAGNOSIS — R0989 Other specified symptoms and signs involving the circulatory and respiratory systems: Secondary | ICD-10-CM

## 2012-08-25 DIAGNOSIS — E559 Vitamin D deficiency, unspecified: Secondary | ICD-10-CM

## 2012-08-25 DIAGNOSIS — R0609 Other forms of dyspnea: Secondary | ICD-10-CM | POA: Diagnosis not present

## 2012-08-25 DIAGNOSIS — I10 Essential (primary) hypertension: Secondary | ICD-10-CM

## 2012-08-25 DIAGNOSIS — R0600 Dyspnea, unspecified: Secondary | ICD-10-CM

## 2012-08-25 MED ORDER — FUROSEMIDE 40 MG PO TABS: 40.0000 mg | ORAL_TABLET | Freq: Every day | ORAL | Status: DC | PRN

## 2012-08-25 NOTE — Assessment & Plan Note (Signed)
Continue with current prescription therapy as reflected on the Med list.  

## 2012-08-25 NOTE — Assessment & Plan Note (Signed)
Loose wt CXR if needed No angina Card ECHO  Furoemide prn

## 2012-08-25 NOTE — Assessment & Plan Note (Signed)
Discussed diet, she lost a little wt

## 2012-08-25 NOTE — Progress Notes (Signed)
Subjective:    Patient ID: [name] [name], female    DOB: 1940/12/08, 71 y.o.   MRN: [id number]  Shortness of Breath This is a chronic problem. The current episode started more than 1 month ago. The problem occurs intermittently. The problem has been waxing and waning. Associated symptoms include leg pain. Pertinent negatives include no abdominal pain, chest pain, headaches, leg swelling or rash. Exacerbated by: walking. Her past medical history is significant for asthma.  No CP or chest pressure  The patient presents for a follow-up of  chronic hypertension, chronic dyslipidemia, type 2 diabetes controlled with medicines C/o RLE lateral pain F/u breast cancer w/mets C/o GERD- not using Prilosec  BP Readings from Last 3 Encounters:  08/25/12 130/80  08/08/12 150/76  05/24/12 150/68   Wt Readings from Last 3 Encounters:  08/25/12 236 lb (107.049 kg)  08/08/12 241 lb (109.317 kg)  05/12/12 233 lb (105.688 kg)      Review of Systems  Constitutional: Negative for chills, activity change, appetite change, fatigue and unexpected weight change.  HENT: Negative for congestion, mouth sores and sinus pressure.   Eyes: Negative for visual disturbance.  Respiratory: Positive for shortness of breath. Negative for cough and chest tightness.   Cardiovascular: Negative for chest pain, palpitations and leg swelling.  Gastrointestinal: Negative for nausea and abdominal pain.  Genitourinary: Negative for frequency, difficulty urinating and vaginal pain.  Musculoskeletal: Positive for arthralgias and gait problem. Negative for back pain.  Skin: Negative for pallor and rash.  Neurological: Negative for dizziness, tremors, weakness, numbness and headaches.  Psychiatric/Behavioral: Negative for confusion and disturbed wake/sleep cycle.  no black ctools      Objective:   Physical Exam  Constitutional: She appears well-developed. No distress.       obese  HENT:  Head: Normocephalic.  Right  Ear: External ear normal.  Left Ear: External ear normal.  Nose: Nose normal.  Mouth/Throat: Oropharynx is clear and moist.  Eyes: Conjunctivae normal are normal. Pupils are equal, round, and reactive to light. Right eye exhibits no discharge. Left eye exhibits no discharge.  Neck: Normal range of motion. Neck supple. No JVD present. No tracheal deviation present. No thyromegaly present.  Cardiovascular: Normal rate, regular rhythm and normal heart sounds.   Pulmonary/Chest: No stridor. No respiratory distress. She has no wheezes.  Abdominal: Soft. Bowel sounds are normal. She exhibits no distension and no mass. There is no tenderness. There is no rebound and no guarding.  Musculoskeletal: She exhibits no edema and no tenderness.  Lymphadenopathy:    She has no cervical adenopathy.  Neurological: She displays normal reflexes. No cranial nerve deficit. She exhibits normal muscle tone. Coordination normal.  Skin: No rash noted. No erythema.  Psychiatric: She has a normal mood and affect. Her behavior is normal. Judgment and thought content normal.  No palor   Lab Results  Component Value Date   WBC 5.7 02/02/2012   HGB 12.2 02/02/2012   HCT 37.5 02/02/2012   PLT 239 02/02/2012   GLUCOSE 109* 02/02/2012   CHOL  Value: 187        ATP III CLASSIFICATION:  <200     mg/dL   Desirable  200-239  mg/dL   Borderline High  >=240    mg/dL   High        02/14/2010   TRIG 61 02/14/2010   HDL 52 02/14/2010   LDLCALC  Value: 123        Total Cholesterol/HDL:CHD Risk Coronary  Heart Disease Risk Table                     Men   Women  1/2 Average Risk   3.4   3.3  Average Risk       5.0   4.4  2 X Average Risk   9.6   7.1  3 X Average Risk  23.4   11.0        Use the calculated Patient Ratio above and the CHD Risk Table to determine the patient's CHD Risk.        ATP III CLASSIFICATION (LDL):  <100     mg/dL   Optimal  100-129  mg/dL   Near or Above                    Optimal  130-159  mg/dL   Borderline  160-189  mg/dL    High  >190     mg/dL   Very High* 02/14/2010   ALT 10 02/02/2012   AST 14 02/02/2012   NA 137 02/02/2012   K 4.0 02/02/2012   CL 101 02/02/2012   CREATININE 1.16* 02/02/2012   BUN 17 02/02/2012   CO2 21 02/02/2012   TSH 1.916 **Test methodology is 3rd generation TSH** 02/15/2010   INR 1.10 04/05/2011   HGBA1C  Value: 6.4 (NOTE)                                                                       According to the ADA Clinical Practice Recommendations for 2011, when HbA1c is used as a screening test:   >=6.5%   Diagnostic of Diabetes Mellitus           (if abnormal result  is confirmed)  5.7-6.4%   Increased risk of developing Diabetes Mellitus  References:Diagnosis and Classification of Diabetes Mellitus,Diabetes [id number] 1):S62-S69 and Standards of Medical Care in         Diabetes - 2011,Diabetes Care,2011,34  (Suppl 1):S11-S61.* 02/15/2010        Assessment & Plan:

## 2012-08-30 ENCOUNTER — Other Ambulatory Visit (HOSPITAL_COMMUNITY): Payer: Medicare Other

## 2012-09-05 ENCOUNTER — Other Ambulatory Visit: Payer: Medicare Other | Admitting: Lab

## 2012-09-05 ENCOUNTER — Ambulatory Visit: Payer: Medicare Other

## 2012-09-06 ENCOUNTER — Ambulatory Visit (HOSPITAL_COMMUNITY): Payer: Medicare Other | Attending: Cardiovascular Disease

## 2012-09-06 ENCOUNTER — Telehealth: Payer: Self-pay | Admitting: Internal Medicine

## 2012-09-06 DIAGNOSIS — R0609 Other forms of dyspnea: Secondary | ICD-10-CM | POA: Diagnosis not present

## 2012-09-06 DIAGNOSIS — E119 Type 2 diabetes mellitus without complications: Secondary | ICD-10-CM | POA: Insufficient documentation

## 2012-09-06 DIAGNOSIS — I369 Nonrheumatic tricuspid valve disorder, unspecified: Secondary | ICD-10-CM | POA: Insufficient documentation

## 2012-09-06 DIAGNOSIS — I080 Rheumatic disorders of both mitral and aortic valves: Secondary | ICD-10-CM | POA: Insufficient documentation

## 2012-09-06 DIAGNOSIS — R0989 Other specified symptoms and signs involving the circulatory and respiratory systems: Secondary | ICD-10-CM | POA: Diagnosis not present

## 2012-09-06 DIAGNOSIS — I10 Essential (primary) hypertension: Secondary | ICD-10-CM | POA: Diagnosis not present

## 2012-09-06 NOTE — Progress Notes (Signed)
Echocardiogram performed.  

## 2012-09-06 NOTE — Telephone Encounter (Signed)
[name] [name], please, inform patient that her cardiac ECHO was overall ok Thx

## 2012-09-06 NOTE — Telephone Encounter (Signed)
Pt informed

## 2012-09-08 ENCOUNTER — Telehealth: Payer: Self-pay | Admitting: Internal Medicine

## 2012-09-08 NOTE — Telephone Encounter (Signed)
Pt came in today as she missed her lab and zometa  On 11/5,r/s for 11/14    aom

## 2012-09-14 ENCOUNTER — Ambulatory Visit (HOSPITAL_BASED_OUTPATIENT_CLINIC_OR_DEPARTMENT_OTHER): Payer: Medicare Other

## 2012-09-14 ENCOUNTER — Other Ambulatory Visit (HOSPITAL_BASED_OUTPATIENT_CLINIC_OR_DEPARTMENT_OTHER): Payer: Medicare Other | Admitting: Lab

## 2012-09-14 VITALS — BP 135/82 | HR 67 | Temp 97.9°F

## 2012-09-14 DIAGNOSIS — C50919 Malignant neoplasm of unspecified site of unspecified female breast: Secondary | ICD-10-CM

## 2012-09-14 DIAGNOSIS — C7951 Secondary malignant neoplasm of bone: Secondary | ICD-10-CM | POA: Diagnosis not present

## 2012-09-14 DIAGNOSIS — C7952 Secondary malignant neoplasm of bone marrow: Secondary | ICD-10-CM

## 2012-09-14 LAB — BASIC METABOLIC PANEL (CC13)
CO2: 23 mEq/L (ref 22–29)
Glucose: 90 mg/dl (ref 70–99)
Potassium: 3.5 mEq/L (ref 3.5–5.1)
Sodium: 141 mEq/L (ref 136–145)

## 2012-09-14 MED ORDER — SODIUM CHLORIDE 0.9 % IV SOLN
Freq: Once | INTRAVENOUS | Status: AC
  Administered 2012-09-14: 09:00:00 via INTRAVENOUS

## 2012-09-14 MED ORDER — ZOLEDRONIC ACID 4 MG/5ML IV CONC
4.0000 mg | Freq: Once | INTRAVENOUS | Status: AC
  Administered 2012-09-14: 4 mg via INTRAVENOUS
  Filled 2012-09-14: qty 5

## 2012-09-14 MED ORDER — SODIUM CHLORIDE 0.9 % IJ SOLN
10.0000 mL | INTRAMUSCULAR | Status: DC | PRN
  Administered 2012-09-14: 10 mL
  Filled 2012-09-14: qty 10

## 2012-09-14 MED ORDER — HEPARIN SOD (PORK) LOCK FLUSH 100 UNIT/ML IV SOLN
500.0000 [IU] | Freq: Once | INTRAVENOUS | Status: AC | PRN
  Administered 2012-09-14: 500 [IU]
  Filled 2012-09-14: qty 5

## 2012-09-14 NOTE — Patient Instructions (Signed)
[facility] Discharge Instructions for Patients Receiving Chemotherapy  Today you received the following chemotherapy agents Zometa To help prevent nausea and vomiting after your treatment, we encourage you to take your nausea medication as prescribed.   If you develop nausea and vomiting that is not controlled by your nausea medication, call the clinic. If it is after clinic hours your family physician or the after hours number for the clinic or go to the Emergency Department.   BELOW ARE SYMPTOMS THAT SHOULD BE REPORTED IMMEDIATELY:  *FEVER GREATER THAN 100.5 F  *CHILLS WITH OR WITHOUT FEVER  NAUSEA AND VOMITING THAT IS NOT CONTROLLED WITH YOUR NAUSEA MEDICATION  *UNUSUAL SHORTNESS OF BREATH  *UNUSUAL BRUISING OR BLEEDING  TENDERNESS IN MOUTH AND THROAT WITH OR WITHOUT PRESENCE OF ULCERS  *URINARY PROBLEMS  *BOWEL PROBLEMS  UNUSUAL RASH Items with * indicate a potential emergency and should be followed up as soon as possible.  One of the nurses will contact you 24 hours after your treatment. Please let the nurse know about any problems that you may have experienced. Feel free to call the clinic you have any questions or concerns. The clinic phone number is [phone].   I have been informed and understand all the instructions given to me. I know to contact the clinic, my physician, or go to the Emergency Department if any problems should occur. I do not have any questions at this time, but understand that I may call the clinic during office hours   should I have any questions or need assistance in obtaining follow up care.    __________________________________________  _____________  __________ Signature of Patient or Authorized Representative            Date                   Time    __________________________________________ Nurse's Signature

## 2012-10-03 ENCOUNTER — Telehealth: Payer: Self-pay | Admitting: *Deleted

## 2012-10-03 ENCOUNTER — Telehealth: Payer: Self-pay | Admitting: Internal Medicine

## 2012-10-03 ENCOUNTER — Other Ambulatory Visit: Payer: Self-pay | Admitting: *Deleted

## 2012-10-03 ENCOUNTER — Other Ambulatory Visit: Payer: Medicare Other | Admitting: Lab

## 2012-10-03 ENCOUNTER — Ambulatory Visit: Payer: Medicare Other

## 2012-10-03 NOTE — Telephone Encounter (Signed)
Per staff message and POF I have scheduled appts.  JMW  

## 2012-10-03 NOTE — Telephone Encounter (Signed)
l/m with all nerw appt info per 12/3 pof     anne

## 2012-10-11 ENCOUNTER — Telehealth: Payer: Self-pay | Admitting: Internal Medicine

## 2012-10-11 NOTE — Telephone Encounter (Signed)
Returned pt's call and confirmed next appt for 12/16. Added lb due to appts that were cx'd on 12/3 were for lb/inf. Pt also informed of 11/16/12 appt. Pt was contacted by phone 12/3 but states she can not get the messages off her home or cell phone.

## 2012-10-16 ENCOUNTER — Ambulatory Visit (HOSPITAL_BASED_OUTPATIENT_CLINIC_OR_DEPARTMENT_OTHER): Payer: Medicare Other

## 2012-10-16 ENCOUNTER — Other Ambulatory Visit (HOSPITAL_BASED_OUTPATIENT_CLINIC_OR_DEPARTMENT_OTHER): Payer: Medicare Other | Admitting: Lab

## 2012-10-16 VITALS — BP 152/84 | HR 81 | Temp 97.7°F | Resp 17

## 2012-10-16 DIAGNOSIS — C7951 Secondary malignant neoplasm of bone: Secondary | ICD-10-CM

## 2012-10-16 DIAGNOSIS — C7952 Secondary malignant neoplasm of bone marrow: Secondary | ICD-10-CM

## 2012-10-16 DIAGNOSIS — C50919 Malignant neoplasm of unspecified site of unspecified female breast: Secondary | ICD-10-CM

## 2012-10-16 LAB — BASIC METABOLIC PANEL (CC13)
CO2: 24 mEq/L (ref 22–29)
Calcium: 9.2 mg/dL (ref 8.4–10.4)
Creatinine: 1 mg/dL (ref 0.6–1.1)
Glucose: 103 mg/dl — ABNORMAL HIGH (ref 70–99)

## 2012-10-16 MED ORDER — ZOLEDRONIC ACID 4 MG/100ML IV SOLN
4.0000 mg | Freq: Once | INTRAVENOUS | Status: AC
Start: 2012-10-16 — End: 2012-10-16
  Administered 2012-10-16: 4 mg via INTRAVENOUS
  Filled 2012-10-16: qty 100

## 2012-10-16 MED ORDER — SODIUM CHLORIDE 0.9 % IV SOLN
Freq: Once | INTRAVENOUS | Status: AC
  Administered 2012-10-16: 09:00:00 via INTRAVENOUS

## 2012-10-16 MED ORDER — HEPARIN SOD (PORK) LOCK FLUSH 100 UNIT/ML IV SOLN
500.0000 [IU] | Freq: Once | INTRAVENOUS | Status: AC | PRN
  Administered 2012-10-16: 500 [IU]
  Filled 2012-10-16: qty 5

## 2012-10-16 MED ORDER — SODIUM CHLORIDE 0.9 % IJ SOLN
10.0000 mL | INTRAMUSCULAR | Status: DC | PRN
  Administered 2012-10-16: 10 mL
  Filled 2012-10-16: qty 10

## 2012-10-16 NOTE — Patient Instructions (Signed)
ZOLEDRONIC ACID (ZOE le dron ik AS id) lowers the amount of calcium loss from bone. It is used to treat too much calcium in your blood from cancer. It is also used to prevent complications of cancer that has spread to the bone. This medicine may be used for other purposes; ask your health care provider or pharmacist if you have questions. What should I tell my health care provider before I take this medicine? They need to know if you have any of these conditions: -aspirin-sensitive asthma -dental disease -kidney disease -an unusual or allergic reaction to zoledronic acid, other medicines, foods, dyes, or preservatives -pregnant or trying to get pregnant -breast-feeding How should I use this medicine? This medicine is for infusion into a vein. It is given by a health care professional in a hospital or clinic setting. Talk to your pediatrician regarding the use of this medicine in children. Special care may be needed. Overdosage: If you think you have taken too much of this medicine contact a poison control center or emergency room at once. NOTE: This medicine is only for you. Do not share this medicine with others. What if I miss a dose? It is important not to miss your dose. Call your doctor or health care professional if you are unable to keep an appointment. What may interact with this medicine? -certain antibiotics given by injection -NSAIDs, medicines for pain and inflammation, like ibuprofen or naproxen -some diuretics like bumetanide, furosemide -teriparatide -thalidomide This list may not describe all possible interactions. Give your health care provider a list of all the medicines, herbs, non-prescription drugs, or dietary supplements you use. Also tell them if you smoke, drink alcohol, or use illegal drugs. Some items may interact with your medicine. What should I watch for while using this medicine? Visit your doctor or health care professional for regular checkups. It may be  some time before you see the benefit from this medicine. Do not stop taking your medicine unless your doctor tells you to. Your doctor may order blood tests or other tests to see how you are doing. Women should inform their doctor if they wish to become pregnant or think they might be pregnant. There is a potential for serious side effects to an unborn child. Talk to your health care professional or pharmacist for more information. You should make sure that you get enough calcium and vitamin D while you are taking this medicine. Discuss the foods you eat and the vitamins you take with your health care professional. Some people who take this medicine have severe bone, joint, and/or muscle pain. This medicine may also increase your risk for a broken thigh bone. Tell your doctor right away if you have pain in your upper leg or groin. Tell your doctor if you have any pain that does not go away or that gets worse. What side effects may I notice from receiving this medicine? Side effects that you should report to your doctor or health care professional as soon as possible: -allergic reactions like skin rash, itching or hives, swelling of the face, lips, or tongue -anxiety, confusion, or depression -breathing problems -changes in vision -feeling faint or lightheaded, falls -jaw burning, cramping, pain -muscle cramps, stiffness, or weakness -trouble passing urine or change in the amount of urine Side effects that usually do not require medical attention (report to your doctor or health care professional if they continue or are bothersome): -bone, joint, or muscle pain -fever -hair loss -irritation at site where injected -  loss of appetite -nausea, vomiting -stomach upset -tired This list may not describe all possible side effects. Call your doctor for medical advice about side effects. You may report side effects to FDA at 1-800-FDA-1088. Where should I keep my medicine? This drug is given in a  hospital or clinic and will not be stored at home. NOTE: This sheet is a summary. It may not cover all possible information. If you have questions about this medicine, talk to your doctor, pharmacist, or health care provider.  2012, Elsevier/Gold Standard. (04/16/2011 9:06:58 AM)

## 2012-11-01 DIAGNOSIS — R42 Dizziness and giddiness: Secondary | ICD-10-CM

## 2012-11-01 HISTORY — DX: Dizziness and giddiness: R42

## 2012-11-03 ENCOUNTER — Ambulatory Visit: Payer: Medicare Other | Admitting: Internal Medicine

## 2012-11-07 ENCOUNTER — Other Ambulatory Visit: Payer: Medicare Other | Admitting: Lab

## 2012-11-07 ENCOUNTER — Ambulatory Visit: Payer: Medicare Other | Admitting: Internal Medicine

## 2012-11-07 ENCOUNTER — Ambulatory Visit: Payer: Medicare Other

## 2012-11-15 ENCOUNTER — Other Ambulatory Visit: Payer: Self-pay | Admitting: *Deleted

## 2012-11-16 ENCOUNTER — Telehealth: Payer: Self-pay | Admitting: Internal Medicine

## 2012-11-16 ENCOUNTER — Ambulatory Visit (HOSPITAL_BASED_OUTPATIENT_CLINIC_OR_DEPARTMENT_OTHER): Payer: Medicare Other | Admitting: Internal Medicine

## 2012-11-16 ENCOUNTER — Other Ambulatory Visit (HOSPITAL_BASED_OUTPATIENT_CLINIC_OR_DEPARTMENT_OTHER): Payer: Medicare Other | Admitting: Lab

## 2012-11-16 ENCOUNTER — Ambulatory Visit (HOSPITAL_BASED_OUTPATIENT_CLINIC_OR_DEPARTMENT_OTHER): Payer: Medicare Other

## 2012-11-16 ENCOUNTER — Encounter: Payer: Self-pay | Admitting: Internal Medicine

## 2012-11-16 ENCOUNTER — Telehealth: Payer: Self-pay | Admitting: *Deleted

## 2012-11-16 VITALS — BP 137/76 | HR 76 | Temp 97.3°F | Resp 20 | Ht 64.0 in | Wt 235.0 lb

## 2012-11-16 DIAGNOSIS — C7951 Secondary malignant neoplasm of bone: Secondary | ICD-10-CM | POA: Diagnosis not present

## 2012-11-16 DIAGNOSIS — C50919 Malignant neoplasm of unspecified site of unspecified female breast: Secondary | ICD-10-CM

## 2012-11-16 DIAGNOSIS — C7952 Secondary malignant neoplasm of bone marrow: Secondary | ICD-10-CM

## 2012-11-16 DIAGNOSIS — Z170 Estrogen receptor positive status [ER+]: Secondary | ICD-10-CM | POA: Diagnosis not present

## 2012-11-16 LAB — BASIC METABOLIC PANEL (CC13)
Calcium: 8.9 mg/dL (ref 8.4–10.4)
Creatinine: 1 mg/dL (ref 0.6–1.1)

## 2012-11-16 MED ORDER — HEPARIN SOD (PORK) LOCK FLUSH 100 UNIT/ML IV SOLN
250.0000 [IU] | Freq: Once | INTRAVENOUS | Status: AC | PRN
  Administered 2012-11-16: 500 [IU]
  Filled 2012-11-16: qty 5

## 2012-11-16 MED ORDER — SODIUM CHLORIDE 0.9 % IJ SOLN
10.0000 mL | INTRAMUSCULAR | Status: DC | PRN
Start: 2012-11-16 — End: 2012-11-16
  Administered 2012-11-16: 10 mL
  Filled 2012-11-16: qty 10

## 2012-11-16 MED ORDER — ZOLEDRONIC ACID 4 MG/100ML IV SOLN
4.0000 mg | Freq: Once | INTRAVENOUS | Status: AC
  Administered 2012-11-16: 4 mg via INTRAVENOUS
  Filled 2012-11-16: qty 100

## 2012-11-16 MED ORDER — SODIUM CHLORIDE 0.9 % IV SOLN
Freq: Once | INTRAVENOUS | Status: AC
  Administered 2012-11-16: 14:00:00 via INTRAVENOUS

## 2012-11-16 NOTE — Telephone Encounter (Signed)
Per staff message and POF I have scheduled appts.  JMW  

## 2012-11-16 NOTE — Patient Instructions (Addendum)
[facility] Discharge Instructions for Patients Receiving Chemotherapy  Today you received the following chemotherapy agents :  Zometa.  To help prevent nausea and vomiting after your treatment, we encourage you to take your nausea medication as instructed by your physician.    If you develop nausea and vomiting that is not controlled by your nausea medication, call the clinic. If it is after clinic hours your family physician or the after hours number for the clinic or go to the Emergency Department.   BELOW ARE SYMPTOMS THAT SHOULD BE REPORTED IMMEDIATELY:  *FEVER GREATER THAN 100.5 F  *CHILLS WITH OR WITHOUT FEVER  NAUSEA AND VOMITING THAT IS NOT CONTROLLED WITH YOUR NAUSEA MEDICATION  *UNUSUAL SHORTNESS OF BREATH  *UNUSUAL BRUISING OR BLEEDING  TENDERNESS IN MOUTH AND THROAT WITH OR WITHOUT PRESENCE OF ULCERS  *URINARY PROBLEMS  *BOWEL PROBLEMS  UNUSUAL RASH Items with * indicate a potential emergency and should be followed up as soon as possible.  One of the nurses will contact you 24 hours after your treatment. Please let the nurse know about any problems that you may have experienced. Feel free to call the clinic you have any questions or concerns. The clinic phone number is [phone].   I have been informed and understand all the instructions given to me. I know to contact the clinic, my physician, or go to the Emergency Department if any problems should occur. I do not have any questions at this time, but understand that I may call the clinic during office hours   should I have any questions or need assistance in obtaining follow up care.    __________________________________________  _____________  __________ Signature of Patient or Authorized Representative            Date                   Time    __________________________________________ Nurse's Signature

## 2012-11-16 NOTE — Progress Notes (Signed)
Pt confused about her PCP meds and I told her to discuss with Dr Alain Marion.

## 2012-11-16 NOTE — Telephone Encounter (Signed)
appts made and printed for pt,pt aware that tx will follow and email has been sent to mw      [name] [name]

## 2012-11-16 NOTE — Progress Notes (Signed)
[place] [phone]   Fax:([phone]  OFFICE PROGRESS NOTE  Walker Kehr, MD [address] [facility] [place] [address]  DIAGNOSIS: Metastatic breast adenocarcinoma, presenting with bone metastasis in the left hip area. This was initially diagnosed as stage II (T2, N1, M0) invasive right breast carcinoma with positive estrogen and progesterone receptors and HER-2/neu positive in July 2002   PRIOR THERAPY:  1. Status post right lumpectomy with right axillary lymph node dissection revealing 4/9 lymph nodes that were positive for malignancy.  2. Status post 8 cycles of systemic chemotherapy with CMF, completed March 2003.  3. Status post radiotherapy to the remaining right breast under the care of Dr. Tammi Klippel.  4. Status post 2 years of treatment with tamoxifen, started July 2003, then switched to Femara by Dr. Humphrey Rolls in August 2005. The patient discontinued the treatment in February 2009.  5. Status post left total hip replacement on Apr 01, 2011, secondary to pathologic left femoral neck fracture.  CURRENT THERAPY:  1. Arimidex 1 mg p.o. daily, started 03/18/2011.  2. Zometa 4 mg IV on a monthly basis.  INTERVAL HISTORY: [name] [name] 72 y.o. female returns to the clinic today for routine three-month followup visit. The patient is feeling fine today with no specific complaints except for some aching pain all over her body started earlier today. She denied having any significant fever or chills. She has no significant weight loss or night sweats. The patient is rating her current treatment with Arimidex and Zometa fairly well. She denied having any significant chest pain, shortness breath, cough or hemoptysis.  MEDICAL HISTORY: Past Medical History  Diagnosis Date  . Asthma   . Hypertension   . Diabetes mellitus type II   . Cerebrovascular accident     R thalamic CVA 01/2010  . Hyperlipemia   . Dyslipidemia   . TIA  (transient ischemic attack)     x2 in 2011  . Low back pain   . Osteoarthritis   . Anxiety   . Breast cancer     2002; metastatic in 2012    ALLERGIES:  is allergic to atorvastatin.  MEDICATIONS:  Current Outpatient Prescriptions  Medication Sig Dispense Refill  . amLODipine (NORVASC) 10 MG tablet Take 1 tablet (10 mg total) by mouth daily.  90 tablet  3  . anastrozole (ARIMIDEX) 1 MG tablet Take 1 tablet (1 mg total) by mouth daily.  90 tablet  1  . losartan-hydrochlorothiazide (HYZAAR) 100-25 MG per tablet Take 1 tablet by mouth daily.  90 tablet  3  . albuterol (VENTOLIN HFA) 108 (90 BASE) MCG/ACT inhaler Inhale 2 puffs into the lungs 4 (four) times daily.        Marland Kitchen ALPRAZolam (XANAX) 0.5 MG tablet Take 0.5 mg by mouth 2 (two) times daily.        Marland Kitchen aspirin 325 MG tablet Take 325 mg by mouth daily.        . Cholecalciferol (VITAMIN D3) 1000 UNITS CAPS Take by mouth.        . Fluticasone-Salmeterol (ADVAIR DISKUS) 250-50 MCG/DOSE AEPB Inhale 1 puff into the lungs as needed.        Marland Kitchen HYDROcodone-acetaminophen (NORCO) 5-325 MG per tablet Take 1 tablet by mouth 2 (two) times daily as needed.  40 tablet  1  . loratadine (CLARITIN) 10 MG tablet Take 10 mg by mouth daily.        Marland Kitchen  naproxen (NAPROSYN) 500 MG tablet Take 500 mg by mouth 2 (two) times daily with a meal.        . omeprazole (PRILOSEC) 40 MG capsule Take 1 capsule (40 mg total) by mouth daily.  30 capsule  11  . promethazine (PHENERGAN) 25 MG tablet Take 25 mg by mouth every 4 (four) hours as needed.        . rosuvastatin (CRESTOR) 10 MG tablet Take 10 mg by mouth daily.        Marland Kitchen spironolactone (ALDACTONE) 100 MG tablet Take 100 mg by mouth daily.        Marland Kitchen triamcinolone cream (KENALOG) 0.5 % Apply topically 2 (two) times daily.  60 g  3    SURGICAL HISTORY:  Past Surgical History  Procedure Date  . Breast lumpectomy     Right breast 2002  . Joint replacement     L hip due to breast ca met    REVIEW OF SYSTEMS:  A  comprehensive review of systems was negative except for: Constitutional: positive for fatigue Musculoskeletal: positive for myalgias   PHYSICAL EXAMINATION: General appearance: alert, cooperative and no distress Head: Normocephalic, without obvious abnormality, atraumatic Neck: no adenopathy Lymph nodes: Cervical, supraclavicular, and axillary nodes normal. Resp: clear to auscultation bilaterally Cardio: regular rate and rhythm, S1, S2 normal, no murmur, click, rub or gallop GI: soft, non-tender; bowel sounds normal; no masses,  no organomegaly Extremities: extremities normal, atraumatic, no cyanosis or edema  ECOG PERFORMANCE STATUS: 1 - Symptomatic but completely ambulatory  Blood pressure 137/76, pulse 76, temperature 97.3 F (36.3 C), temperature source Oral, resp. rate 20, height 5\' 4"  (1.626 m), weight 235 lb (106.595 kg).  LABORATORY DATA: Lab Results  Component Value Date   WBC 5.7 08/08/2012   HGB 11.9 08/08/2012   HCT 36.2 08/08/2012   MCV 87.9 08/08/2012   PLT 274 08/08/2012      Chemistry      Component Value Date/Time   NA 141 10/16/2012 0807   NA 139 05/24/2012 1221   NA 140 05/03/2012 1449   K 4.0 10/16/2012 0807   K 4.0 05/24/2012 1221   K 4.3 05/03/2012 1449   CL 107 10/16/2012 0807   CL 105 05/24/2012 1221   CL 99 05/03/2012 1449   CO2 24 10/16/2012 0807   CO2 25 05/24/2012 1221   CO2 27 05/03/2012 1449   BUN 9.0 10/16/2012 0807   BUN 12 05/24/2012 1221   BUN 14 05/03/2012 1449   CREATININE 1.0 10/16/2012 0807   CREATININE 1.05 05/24/2012 1221   CREATININE 1.1 05/03/2012 1449      Component Value Date/Time   CALCIUM 9.2 10/16/2012 0807   CALCIUM 9.0 05/24/2012 1221   CALCIUM 9.1 05/03/2012 1449   ALKPHOS 98 08/08/2012 1430   ALKPHOS 78 05/03/2012 1449   ALKPHOS 85 02/02/2012 1534   AST 10 08/08/2012 1430   AST 19 05/03/2012 1449   AST 14 02/02/2012 1534   ALT 7 08/08/2012 1430   ALT 10 02/02/2012 1534   BILITOT 0.30 08/08/2012 1430   BILITOT 0.50 05/03/2012 1449   BILITOT 0.3  02/02/2012 1534       RADIOGRAPHIC STUDIES: No results found.  ASSESSMENT: This is a very pleasant 72 years old African American female with metastatic breast adenocarcinoma with bone metastasis currently on treatment with Arimidex and Zometa. The patient is tolerating her treatment fairly well.   PLAN: She will receive her dose of Zometa today as scheduled. The patient  will continue on Arimidex 1 mg by mouth daily. I would see her back for followup visit in 3 months with repeat CT scan of the chest, abdomen and pelvis for restaging of her disease. She was advised to call me immediately if she has any concerning symptoms in the interval.  All questions were answered. The patient knows to call the clinic with any problems, questions or concerns. We can certainly see the patient much sooner if necessary.

## 2012-11-16 NOTE — Patient Instructions (Signed)
Continue treatment with Arimidex and Zometa as scheduled. Followup in 3 months with repeat CT scan of the chest, abdomen and pelvis.

## 2012-12-18 ENCOUNTER — Ambulatory Visit (HOSPITAL_BASED_OUTPATIENT_CLINIC_OR_DEPARTMENT_OTHER): Payer: Medicare Other

## 2012-12-18 ENCOUNTER — Other Ambulatory Visit (HOSPITAL_BASED_OUTPATIENT_CLINIC_OR_DEPARTMENT_OTHER): Payer: Medicare Other | Admitting: Lab

## 2012-12-18 VITALS — BP 150/66 | HR 77 | Temp 97.8°F | Resp 20

## 2012-12-18 DIAGNOSIS — C50919 Malignant neoplasm of unspecified site of unspecified female breast: Secondary | ICD-10-CM | POA: Diagnosis not present

## 2012-12-18 DIAGNOSIS — C7951 Secondary malignant neoplasm of bone: Secondary | ICD-10-CM | POA: Diagnosis not present

## 2012-12-18 LAB — BASIC METABOLIC PANEL (CC13)
BUN: 7.8 mg/dL (ref 7.0–26.0)
CO2: 22 mEq/L (ref 22–29)
Calcium: 8.7 mg/dL (ref 8.4–10.4)
Chloride: 109 mEq/L — ABNORMAL HIGH (ref 98–107)
Creatinine: 1.1 mg/dL (ref 0.6–1.1)

## 2012-12-18 MED ORDER — HEPARIN SOD (PORK) LOCK FLUSH 100 UNIT/ML IV SOLN
500.0000 [IU] | Freq: Once | INTRAVENOUS | Status: AC | PRN
  Administered 2012-12-18: 500 [IU]
  Filled 2012-12-18: qty 5

## 2012-12-18 MED ORDER — SODIUM CHLORIDE 0.9 % IJ SOLN
10.0000 mL | INTRAMUSCULAR | Status: DC | PRN
  Administered 2012-12-18: 10 mL
  Filled 2012-12-18: qty 10

## 2012-12-18 MED ORDER — ZOLEDRONIC ACID 4 MG/5ML IV CONC: 4.0000 mg | Freq: Once | INTRAVENOUS | Status: DC

## 2012-12-18 MED ORDER — HEPARIN SOD (PORK) LOCK FLUSH 100 UNIT/ML IV SOLN
250.0000 [IU] | Freq: Once | INTRAVENOUS | Status: DC | PRN
  Filled 2012-12-18: qty 5

## 2012-12-18 MED ORDER — ZOLEDRONIC ACID 4 MG/100ML IV SOLN
4.0000 mg | Freq: Once | INTRAVENOUS | Status: AC
  Administered 2012-12-18: 4 mg via INTRAVENOUS
  Filled 2012-12-18: qty 100

## 2012-12-18 NOTE — Progress Notes (Signed)
During Zometa infusion, pt noticed dripping from bag. This RN assessed, several drops noted on floor, seemed to be dripping around spike. Spike pushed further into bag. Dripping stopped.

## 2012-12-18 NOTE — Patient Instructions (Signed)
Zoledronic Acid injection (Hypercalcemia, Oncology) What is this medicine? ZOLEDRONIC ACID (ZOE le dron ik AS id) lowers the amount of calcium loss from bone. It is used to treat too much calcium in your blood from cancer. It is also used to prevent complications of cancer that has spread to the bone. This medicine may be used for other purposes; ask your health care provider or pharmacist if you have questions. What should I tell my health care provider before I take this medicine? They need to know if you have any of these conditions: -aspirin-sensitive asthma -dental disease -kidney disease -an unusual or allergic reaction to zoledronic acid, other medicines, foods, dyes, or preservatives -pregnant or trying to get pregnant -breast-feeding How should I use this medicine? This medicine is for infusion into a vein. It is given by a health care professional in a hospital or clinic setting. Talk to your pediatrician regarding the use of this medicine in children. Special care may be needed. Overdosage: If you think you have taken too much of this medicine contact a poison control center or emergency room at once. NOTE: This medicine is only for you. Do not share this medicine with others. What if I miss a dose? It is important not to miss your dose. Call your doctor or health care professional if you are unable to keep an appointment. What may interact with this medicine? -certain antibiotics given by injection -NSAIDs, medicines for pain and inflammation, like ibuprofen or naproxen -some diuretics like bumetanide, furosemide -teriparatide -thalidomide This list may not describe all possible interactions. Give your health care provider a list of all the medicines, herbs, non-prescription drugs, or dietary supplements you use. Also tell them if you smoke, drink alcohol, or use illegal drugs. Some items may interact with your medicine. What should I watch for while using this medicine? Visit  your doctor or health care professional for regular checkups. It may be some time before you see the benefit from this medicine. Do not stop taking your medicine unless your doctor tells you to. Your doctor may order blood tests or other tests to see how you are doing. Women should inform their doctor if they wish to become pregnant or think they might be pregnant. There is a potential for serious side effects to an unborn child. Talk to your health care professional or pharmacist for more information. You should make sure that you get enough calcium and vitamin D while you are taking this medicine. Discuss the foods you eat and the vitamins you take with your health care professional. Some people who take this medicine have severe bone, joint, and/or muscle pain. This medicine may also increase your risk for a broken thigh bone. Tell your doctor right away if you have pain in your upper leg or groin. Tell your doctor if you have any pain that does not go away or that gets worse. What side effects may I notice from receiving this medicine? Side effects that you should report to your doctor or health care professional as soon as possible: -allergic reactions like skin rash, itching or hives, swelling of the face, lips, or tongue -anxiety, confusion, or depression -breathing problems -changes in vision -feeling faint or lightheaded, falls -jaw burning, cramping, pain -muscle cramps, stiffness, or weakness -trouble passing urine or change in the amount of urine Side effects that usually do not require medical attention (report to your doctor or health care professional if they continue or are bothersome): -bone, joint, or muscle pain -  fever -hair loss -irritation at site where injected -loss of appetite -nausea, vomiting -stomach upset -tired This list may not describe all possible side effects. Call your doctor for medical advice about side effects. You may report side effects to FDA at  1-800-FDA-1088. Where should I keep my medicine? This drug is given in a hospital or clinic and will not be stored at home. NOTE: This sheet is a summary. It may not cover all possible information. If you have questions about this medicine, talk to your doctor, pharmacist, or health care provider.  2013, [employer]. (04/16/2011 9:06:58 AM)

## 2013-01-15 ENCOUNTER — Other Ambulatory Visit: Payer: Medicare Other | Admitting: Lab

## 2013-01-15 ENCOUNTER — Ambulatory Visit: Payer: Medicare Other

## 2013-01-18 ENCOUNTER — Other Ambulatory Visit: Payer: Self-pay | Admitting: Internal Medicine

## 2013-01-18 ENCOUNTER — Ambulatory Visit (HOSPITAL_BASED_OUTPATIENT_CLINIC_OR_DEPARTMENT_OTHER): Payer: Medicare Other

## 2013-01-18 ENCOUNTER — Ambulatory Visit (HOSPITAL_BASED_OUTPATIENT_CLINIC_OR_DEPARTMENT_OTHER): Payer: Medicare Other | Admitting: Lab

## 2013-01-18 VITALS — BP 133/73 | HR 81 | Temp 97.2°F | Resp 20

## 2013-01-18 DIAGNOSIS — C7951 Secondary malignant neoplasm of bone: Secondary | ICD-10-CM | POA: Diagnosis not present

## 2013-01-18 DIAGNOSIS — C50919 Malignant neoplasm of unspecified site of unspecified female breast: Secondary | ICD-10-CM | POA: Diagnosis not present

## 2013-01-18 DIAGNOSIS — C7952 Secondary malignant neoplasm of bone marrow: Secondary | ICD-10-CM

## 2013-01-18 LAB — BASIC METABOLIC PANEL (CC13)
BUN: 12.7 mg/dL (ref 7.0–26.0)
Chloride: 107 mEq/L (ref 98–107)
Potassium: 3.6 mEq/L (ref 3.5–5.1)

## 2013-01-18 MED ORDER — ZOLEDRONIC ACID 4 MG/5ML IV CONC
4.0000 mg | Freq: Once | INTRAVENOUS | Status: AC
  Administered 2013-01-18: 4 mg via INTRAVENOUS
  Filled 2013-01-18: qty 5

## 2013-01-18 MED ORDER — HEPARIN SOD (PORK) LOCK FLUSH 100 UNIT/ML IV SOLN
500.0000 [IU] | Freq: Once | INTRAVENOUS | Status: DC | PRN
  Filled 2013-01-18: qty 5

## 2013-01-18 NOTE — Patient Instructions (Addendum)
Zoledronic Acid injection (Hypercalcemia, Oncology) What is this medicine? ZOLEDRONIC ACID (ZOE le dron ik AS id) lowers the amount of calcium loss from bone. It is used to treat too much calcium in your blood from cancer. It is also used to prevent complications of cancer that has spread to the bone. This medicine may be used for other purposes; ask your health care provider or pharmacist if you have questions. What should I tell my health care provider before I take this medicine? They need to know if you have any of these conditions: -aspirin-sensitive asthma -dental disease -kidney disease -an unusual or allergic reaction to zoledronic acid, other medicines, foods, dyes, or preservatives -pregnant or trying to get pregnant -breast-feeding How should I use this medicine? This medicine is for infusion into a vein. It is given by a health care professional in a hospital or clinic setting. Talk to your pediatrician regarding the use of this medicine in children. Special care may be needed. Overdosage: If you think you have taken too much of this medicine contact a poison control center or emergency room at once. NOTE: This medicine is only for you. Do not share this medicine with others. What if I miss a dose? It is important not to miss your dose. Call your doctor or health care professional if you are unable to keep an appointment. What may interact with this medicine? -certain antibiotics given by injection -NSAIDs, medicines for pain and inflammation, like ibuprofen or naproxen -some diuretics like bumetanide, furosemide -teriparatide -thalidomide This list may not describe all possible interactions. Give your health care provider a list of all the medicines, herbs, non-prescription drugs, or dietary supplements you use. Also tell them if you smoke, drink alcohol, or use illegal drugs. Some items may interact with your medicine. What should I watch for while using this medicine? Visit  your doctor or health care professional for regular checkups. It may be some time before you see the benefit from this medicine. Do not stop taking your medicine unless your doctor tells you to. Your doctor may order blood tests or other tests to see how you are doing. Women should inform their doctor if they wish to become pregnant or think they might be pregnant. There is a potential for serious side effects to an unborn child. Talk to your health care professional or pharmacist for more information. You should make sure that you get enough calcium and vitamin D while you are taking this medicine. Discuss the foods you eat and the vitamins you take with your health care professional. Some people who take this medicine have severe bone, joint, and/or muscle pain. This medicine may also increase your risk for a broken thigh bone. Tell your doctor right away if you have pain in your upper leg or groin. Tell your doctor if you have any pain that does not go away or that gets worse. What side effects may I notice from receiving this medicine? Side effects that you should report to your doctor or health care professional as soon as possible: -allergic reactions like skin rash, itching or hives, swelling of the face, lips, or tongue -anxiety, confusion, or depression -breathing problems -changes in vision -feeling faint or lightheaded, falls -jaw burning, cramping, pain -muscle cramps, stiffness, or weakness -trouble passing urine or change in the amount of urine Side effects that usually do not require medical attention (report to your doctor or health care professional if they continue or are bothersome): -bone, joint, or muscle pain -  fever -hair loss -irritation at site where injected -loss of appetite -nausea, vomiting -stomach upset -tired This list may not describe all possible side effects. Call your doctor for medical advice about side effects. You may report side effects to FDA at  1-800-FDA-1088. Where should I keep my medicine? This drug is given in a hospital or clinic and will not be stored at home. NOTE: This sheet is a summary. It may not cover all possible information. If you have questions about this medicine, talk to your doctor, pharmacist, or health care provider.  2012, [employer]. (04/16/2011 9:06:58 AM)

## 2013-02-07 ENCOUNTER — Telehealth: Payer: Self-pay | Admitting: Internal Medicine

## 2013-02-07 ENCOUNTER — Telehealth: Payer: Self-pay | Admitting: *Deleted

## 2013-02-07 NOTE — Telephone Encounter (Signed)
pt came in needed to r/s appts due to going out of town.Marland KitchenMarland KitchenMarland KitchenDone...emailed michelle to move tx to [phone]

## 2013-02-07 NOTE — Telephone Encounter (Signed)
Per staff message and POF I have scheduled appts.  JMW  

## 2013-02-09 ENCOUNTER — Other Ambulatory Visit: Payer: Self-pay | Admitting: Internal Medicine

## 2013-02-15 ENCOUNTER — Ambulatory Visit (HOSPITAL_COMMUNITY): Payer: Medicare Other

## 2013-02-16 ENCOUNTER — Other Ambulatory Visit: Payer: Medicare Other | Admitting: Lab

## 2013-02-19 ENCOUNTER — Ambulatory Visit: Payer: Medicare Other | Admitting: Internal Medicine

## 2013-02-19 ENCOUNTER — Ambulatory Visit: Payer: Medicare Other

## 2013-02-23 ENCOUNTER — Other Ambulatory Visit (HOSPITAL_BASED_OUTPATIENT_CLINIC_OR_DEPARTMENT_OTHER): Payer: Medicare Other | Admitting: Lab

## 2013-02-23 ENCOUNTER — Ambulatory Visit (HOSPITAL_COMMUNITY)
Admission: RE | Admit: 2013-02-23 | Discharge: 2013-02-23 | Disposition: A | Discharge status: Home / Self Care | Payer: Medicare Other | Source: Ambulatory Visit | Attending: Internal Medicine | Admitting: Internal Medicine

## 2013-02-23 ENCOUNTER — Encounter (HOSPITAL_COMMUNITY): Payer: Self-pay

## 2013-02-23 ENCOUNTER — Other Ambulatory Visit: Payer: Medicare Other | Admitting: Lab

## 2013-02-23 DIAGNOSIS — K8020 Calculus of gallbladder without cholecystitis without obstruction: Secondary | ICD-10-CM | POA: Diagnosis not present

## 2013-02-23 DIAGNOSIS — C7951 Secondary malignant neoplasm of bone: Secondary | ICD-10-CM

## 2013-02-23 DIAGNOSIS — C50919 Malignant neoplasm of unspecified site of unspecified female breast: Secondary | ICD-10-CM | POA: Diagnosis not present

## 2013-02-23 LAB — COMPREHENSIVE METABOLIC PANEL (CC13)
ALT: 8 U/L (ref 0–55)
AST: 13 U/L (ref 5–34)
Creatinine: 1 mg/dL (ref 0.6–1.1)
Sodium: 143 mEq/L (ref 136–145)
Total Bilirubin: 0.22 mg/dL (ref 0.20–1.20)
Total Protein: 7.8 g/dL (ref 6.4–8.3)

## 2013-02-23 LAB — CANCER ANTIGEN 27.29: CA 27.29: 51 U/mL — ABNORMAL HIGH (ref 0–39)

## 2013-02-23 LAB — CBC WITH DIFFERENTIAL/PLATELET
BASO%: 0.5 % (ref 0.0–2.0)
EOS%: 2.2 % (ref 0.0–7.0)
HCT: 36.6 % (ref 34.8–46.6)
LYMPH%: 21.7 % (ref 14.0–49.7)
MCH: 28.1 pg (ref 25.1–34.0)
MCHC: 32.4 g/dL (ref 31.5–36.0)
NEUT%: 64.8 % (ref 38.4–76.8)
Platelets: 257 10*3/uL (ref 145–400)
RBC: 4.22 10*6/uL (ref 3.70–5.45)
WBC: 5.1 10*3/uL (ref 3.9–10.3)
lymph#: 1.1 10*3/uL (ref 0.9–3.3)

## 2013-02-23 MED ORDER — IOHEXOL 300 MG/ML  SOLN
100.0000 mL | Freq: Once | INTRAMUSCULAR | Status: AC | PRN
  Administered 2013-02-23: 100 mL via INTRAVENOUS

## 2013-02-26 ENCOUNTER — Encounter: Payer: Self-pay | Admitting: Internal Medicine

## 2013-02-26 ENCOUNTER — Ambulatory Visit (HOSPITAL_BASED_OUTPATIENT_CLINIC_OR_DEPARTMENT_OTHER): Payer: Medicare Other | Admitting: Internal Medicine

## 2013-02-26 ENCOUNTER — Telehealth: Payer: Self-pay | Admitting: Internal Medicine

## 2013-02-26 ENCOUNTER — Ambulatory Visit (HOSPITAL_BASED_OUTPATIENT_CLINIC_OR_DEPARTMENT_OTHER): Payer: Medicare Other

## 2013-02-26 VITALS — BP 118/63 | HR 84 | Temp 97.6°F | Resp 20 | Ht 64.0 in | Wt 227.8 lb

## 2013-02-26 DIAGNOSIS — C50919 Malignant neoplasm of unspecified site of unspecified female breast: Secondary | ICD-10-CM | POA: Diagnosis not present

## 2013-02-26 DIAGNOSIS — C7951 Secondary malignant neoplasm of bone: Secondary | ICD-10-CM

## 2013-02-26 DIAGNOSIS — C7952 Secondary malignant neoplasm of bone marrow: Secondary | ICD-10-CM | POA: Diagnosis not present

## 2013-02-26 DIAGNOSIS — Z170 Estrogen receptor positive status [ER+]: Secondary | ICD-10-CM

## 2013-02-26 DIAGNOSIS — C773 Secondary and unspecified malignant neoplasm of axilla and upper limb lymph nodes: Secondary | ICD-10-CM | POA: Diagnosis not present

## 2013-02-26 DIAGNOSIS — C50911 Malignant neoplasm of unspecified site of right female breast: Secondary | ICD-10-CM

## 2013-02-26 MED ORDER — HEPARIN SOD (PORK) LOCK FLUSH 100 UNIT/ML IV SOLN
250.0000 [IU] | Freq: Once | INTRAVENOUS | Status: AC | PRN
  Administered 2013-02-26: 16:00:00
  Filled 2013-02-26: qty 5

## 2013-02-26 MED ORDER — SODIUM CHLORIDE 0.9 % IV SOLN
INTRAVENOUS | Status: DC
  Administered 2013-02-26: 16:00:00 via INTRAVENOUS

## 2013-02-26 MED ORDER — ZOLEDRONIC ACID 4 MG/100ML IV SOLN
4.0000 mg | Freq: Once | INTRAVENOUS | Status: AC
  Administered 2013-02-26: 4 mg via INTRAVENOUS
  Filled 2013-02-26: qty 100

## 2013-02-26 MED ORDER — ANASTROZOLE 1 MG PO TABS: 1.0000 mg | ORAL_TABLET | Freq: Every day | ORAL | Status: DC

## 2013-02-26 MED ORDER — SODIUM CHLORIDE 0.9 % IJ SOLN
10.0000 mL | INTRAMUSCULAR | Status: DC | PRN
  Administered 2013-02-26: 10 mL via INTRAVENOUS
  Filled 2013-02-26: qty 10

## 2013-02-26 NOTE — Progress Notes (Signed)
I told pt to call PCP about her medicines and what she is taking and not taking.

## 2013-02-26 NOTE — Progress Notes (Signed)
[place] Telephone:([phone]) [phone]   Fax:(336) [phone]  OFFICE PROGRESS NOTE  Walker Kehr, MD [address] [facility] [place] [address]  DIAGNOSIS: Metastatic breast adenocarcinoma, presenting with bone metastasis in the left hip area. This was initially diagnosed as stage II (T2, N1, M0) invasive right breast carcinoma with positive estrogen and progesterone receptors and HER-2/neu positive in July 2002   PRIOR THERAPY:  1. Status post right lumpectomy with right axillary lymph node dissection revealing 4/9 lymph nodes that were positive for malignancy.  2. Status post 8 cycles of systemic chemotherapy with CMF, completed March 2003.  3. Status post radiotherapy to the remaining right breast under the care of Dr. Tammi Klippel.  4. Status post 2 years of treatment with tamoxifen, started July 2003, then switched to Femara by Dr. Humphrey Rolls in August 2005. The patient discontinued the treatment in February 2009.  5. Status post left total hip replacement on Apr 01, 2011, secondary to pathologic left femoral neck fracture.  CURRENT THERAPY:  1. Arimidex 1 mg p.o. daily, started 03/18/2011.  2. Zometa 4 mg IV on a monthly basis.   INTERVAL HISTORY: [name] [name] 72 y.o. female returns to the clinic today for three-month followup visit. The patient is feeling fine today with no specific complaints except for arthritis. She denied having any significant chest pain, shortness of breath except with exertion, no cough or hemoptysis. She denied having any weight loss or night sweats. The patient is tolerating her treatment with Arimidex fairly well with no significant adverse effects. She had repeat CT scan of the chest, abdomen and pelvis performed recently and she is here today for evaluation and discussion of her scan results.  MEDICAL HISTORY: Past Medical History  Diagnosis Date  . Asthma   . Hypertension   . Diabetes mellitus type II   .  Cerebrovascular accident     R thalamic CVA 01/2010  . Hyperlipemia   . Dyslipidemia   . TIA (transient ischemic attack)     x2 in 2011  . Low back pain   . Osteoarthritis   . Anxiety   . Breast cancer     2002; metastatic in 2012    ALLERGIES:  is allergic to atorvastatin.  MEDICATIONS:  Current Outpatient Prescriptions  Medication Sig Dispense Refill  . amLODipine (NORVASC) 10 MG tablet Take 1 tablet (10 mg total) by mouth daily.  90 tablet  3  . aspirin 325 MG tablet Take 325 mg by mouth daily.        Marland Kitchen HYDROcodone-acetaminophen (NORCO) 5-325 MG per tablet Take 1 tablet by mouth 2 (two) times daily as needed.  40 tablet  1  . losartan-hydrochlorothiazide (HYZAAR) 100-25 MG per tablet Take 1 tablet by mouth daily.  90 tablet  3  . triamcinolone cream (KENALOG) 0.5 % Apply topically 2 (two) times daily.  60 g  3  . albuterol (VENTOLIN HFA) 108 (90 BASE) MCG/ACT inhaler Inhale 2 puffs into the lungs 4 (four) times daily.        Marland Kitchen ALPRAZolam (XANAX) 0.5 MG tablet Take 0.5 mg by mouth 2 (two) times daily.        Marland Kitchen anastrozole (ARIMIDEX) 1 MG tablet TAKE 1 TABLET EVERY DAY  90 tablet  0  . Cholecalciferol (VITAMIN D3) 1000 UNITS CAPS Take by mouth.        . Fluticasone-Salmeterol (ADVAIR DISKUS) 250-50 MCG/DOSE AEPB Inhale 1 puff into the  lungs as needed.        Marland Kitchen omeprazole (PRILOSEC) 40 MG capsule Take 1 capsule (40 mg total) by mouth daily.  30 capsule  11  . rosuvastatin (CRESTOR) 10 MG tablet Take 10 mg by mouth daily.        Marland Kitchen spironolactone (ALDACTONE) 100 MG tablet Take 100 mg by mouth daily.         No current facility-administered medications for this visit.    SURGICAL HISTORY:  Past Surgical History  Procedure Laterality Date  . Breast lumpectomy      Right breast 2002  . Joint replacement      L hip due to breast ca met    REVIEW OF SYSTEMS:  A comprehensive review of systems was negative except for: Constitutional: positive for fatigue Musculoskeletal: positive  for arthralgias   PHYSICAL EXAMINATION: General appearance: alert, cooperative and no distress Head: Normocephalic, without obvious abnormality, atraumatic Neck: no adenopathy Lymph nodes: Cervical, supraclavicular, and axillary nodes normal. Resp: clear to auscultation bilaterally Cardio: regular rate and rhythm, S1, S2 normal, no murmur, click, rub or gallop GI: soft, non-tender; bowel sounds normal; no masses,  no organomegaly Extremities: extremities normal, atraumatic, no cyanosis or edema Neurologic: Alert and oriented X 3, normal strength and tone. Normal symmetric reflexes. Normal coordination and gait  ECOG PERFORMANCE STATUS: 1 - Symptomatic but completely ambulatory  Blood pressure 118/63, pulse 84, temperature 97.6 F (36.4 C), temperature source Oral, resp. rate 20, height 5\' 4"  (1.626 m), weight 227 lb 12.8 oz (103.329 kg).  LABORATORY DATA: Lab Results  Component Value Date   WBC 5.1 02/23/2013   HGB 11.9 02/23/2013   HCT 36.6 02/23/2013   MCV 86.8 02/23/2013   PLT 257 02/23/2013      Chemistry      Component Value Date/Time   NA 143 02/23/2013 1225   NA 139 05/24/2012 1221   NA 140 05/03/2012 1449   K 4.1 02/23/2013 1225   K 4.0 05/24/2012 1221   K 4.3 05/03/2012 1449   CL 109* 02/23/2013 1225   CL 105 05/24/2012 1221   CL 99 05/03/2012 1449   CO2 25 02/23/2013 1225   CO2 25 05/24/2012 1221   CO2 27 05/03/2012 1449   BUN 9.2 02/23/2013 1225   BUN 12 05/24/2012 1221   BUN 14 05/03/2012 1449   CREATININE 1.0 02/23/2013 1225   CREATININE 1.05 05/24/2012 1221   CREATININE 1.1 05/03/2012 1449      Component Value Date/Time   CALCIUM 9.2 02/23/2013 1225   CALCIUM 9.0 05/24/2012 1221   CALCIUM 9.1 05/03/2012 1449   ALKPHOS 98 02/23/2013 1225   ALKPHOS 78 05/03/2012 1449   ALKPHOS 85 02/02/2012 1534   AST 13 02/23/2013 1225   AST 19 05/03/2012 1449   AST 14 02/02/2012 1534   ALT 8 02/23/2013 1225   ALT 10 02/02/2012 1534   BILITOT 0.22 02/23/2013 1225   BILITOT 0.50 05/03/2012 1449   BILITOT  0.3 02/02/2012 1534       RADIOGRAPHIC STUDIES: Ct Chest W Contrast  02/23/2013  *RADIOLOGY REPORT*  Clinical Data:  Restaging breast cancer  CT CHEST, ABDOMEN AND PELVIS WITH CONTRAST  Technique:  Multidetector CT imaging of the chest, abdomen and pelvis was performed following the standard protocol during bolus administration of intravenous contrast.  Contrast: 126mL OMNIPAQUE IOHEXOL 300 MG/ML  SOLN  Comparison:  05/03/2012  CT CHEST  Findings: Lungs/pleura: There is no pleural effusion identified.  Subpleural scarring is identified  within the posterior right lower lobe. There are mild fibrotic changes within the subpleural anterior right upper lobe.  No suspicious pulmonary nodule or mass identified.  Heart/Mediastinum: The heart size is normal.  There is no pericardial effusion.  No enlarged mediastinal or hilar lymph nodes identified.  The main pulmonary artery appears patent.  Bones/Musculoskeletal:  There is no axillary or supraclavicular adenopathy.  Left chest wall porta-catheter is identified the tip appears to be in the cavoatrial junction.  Postsurgical changes within the medial right breast noted.  Review of the visualized bony structures is significant for a thoracic spondylosis.  No aggressive bone lesions identified.  IMPRESSION:  1.  Stable CT of the chest.  No specific features to suggest breast cancer recurrence.  CT ABDOMEN AND PELVIS  Findings:  No suspicious liver abnormalities identified.  Stones are identified within the gallbladder lumen.  These measure up to 5 mm, image 60/series 2.  No biliary dilatation identified.  Normal appearance of the pancreas.  The spleen is normal.  The right kidney is normal.  The left kidney is normal.  Normal appearance of the urinary bladder.  Previous hysterectomy.  The abdominal aorta has a normal caliber.  There is no aneurysm. No upper abdominal or pelvic adenopathy.  No inguinal adenopathy.  There is no ascites within the abdomen or pelvis.  The  stomach appears normal.  The small bowel loops have a normal course and caliber.  There is no evidence for obstruction.  The colon is unremarkable.  Review of the visualized osseous structures shows changes from prior left hemi arthroplasty.  There is no aggressive lytic or sclerotic bone lesions identified.  IMPRESSION:  1.  No acute findings.  No specific features to suggest metastatic disease in the abdomen or pelvis. 2.  Gallstones.   Original Report Authenticated By: Kerby Moors, M.D.     ASSESSMENT: This is a very pleasant 72 years old [place] American female with metastatic breast adenocarcinoma currently on treatment with Arimidex and Zometa. The patient is doing fine and tolerating her treatment fairly well. She has no evidence for disease progression on his recent scan.  PLAN: I discussed the scan results with the patient today. I recommended for her to continue on treatment with Arimidex 1 mg by mouth daily as well as Zometa 4 mg IV monthly basis. She would come back for followup visit in 3 months with repeat CBC, comprehensive metabolic panel and CA [id number]. She was advised to call me immediately if she has any concerning symptoms in the interval.  All questions were answered. The patient knows to call the clinic with any problems, questions or concerns. We can certainly see the patient much sooner if necessary.

## 2013-02-26 NOTE — Telephone Encounter (Signed)
gv and printed appt sched for pt for July...emailed MB to add monthly zometa...pt aware

## 2013-02-26 NOTE — Patient Instructions (Signed)
Continue treatment with Arimidex and Zometa. Followup visit in 3 months with repeat blood work

## 2013-02-26 NOTE — Patient Instructions (Addendum)
Zoledronic Acid injection (Hypercalcemia, Oncology) What is this medicine? ZOLEDRONIC ACID (ZOE le dron ik AS id) lowers the amount of calcium loss from bone. It is used to treat too much calcium in your blood from cancer. It is also used to prevent complications of cancer that has spread to the bone. This medicine may be used for other purposes; ask your health care provider or pharmacist if you have questions. What should I tell my health care provider before I take this medicine? They need to know if you have any of these conditions: -aspirin-sensitive asthma -dental disease -kidney disease -an unusual or allergic reaction to zoledronic acid, other medicines, foods, dyes, or preservatives -pregnant or trying to get pregnant -breast-feeding How should I use this medicine? This medicine is for infusion into a vein. It is given by a health care professional in a hospital or clinic setting. Talk to your pediatrician regarding the use of this medicine in children. Special care may be needed. Overdosage: If you think you have taken too much of this medicine contact a poison control center or emergency room at once. NOTE: This medicine is only for you. Do not share this medicine with others. What if I miss a dose? It is important not to miss your dose. Call your doctor or health care professional if you are unable to keep an appointment. What may interact with this medicine? -certain antibiotics given by injection -NSAIDs, medicines for pain and inflammation, like ibuprofen or naproxen -some diuretics like bumetanide, furosemide -teriparatide -thalidomide This list may not describe all possible interactions. Give your health care provider a list of all the medicines, herbs, non-prescription drugs, or dietary supplements you use. Also tell them if you smoke, drink alcohol, or use illegal drugs. Some items may interact with your medicine. What should I watch for while using this medicine? Visit  your doctor or health care professional for regular checkups. It may be some time before you see the benefit from this medicine. Do not stop taking your medicine unless your doctor tells you to. Your doctor may order blood tests or other tests to see how you are doing. Women should inform their doctor if they wish to become pregnant or think they might be pregnant. There is a potential for serious side effects to an unborn child. Talk to your health care professional or pharmacist for more information. You should make sure that you get enough calcium and vitamin D while you are taking this medicine. Discuss the foods you eat and the vitamins you take with your health care professional. Some people who take this medicine have severe bone, joint, and/or muscle pain. This medicine may also increase your risk for a broken thigh bone. Tell your doctor right away if you have pain in your upper leg or groin. Tell your doctor if you have any pain that does not go away or that gets worse. What side effects may I notice from receiving this medicine? Side effects that you should report to your doctor or health care professional as soon as possible: -allergic reactions like skin rash, itching or hives, swelling of the face, lips, or tongue -anxiety, confusion, or depression -breathing problems -changes in vision -feeling faint or lightheaded, falls -jaw burning, cramping, pain -muscle cramps, stiffness, or weakness -trouble passing urine or change in the amount of urine Side effects that usually do not require medical attention (report to your doctor or health care professional if they continue or are bothersome): -bone, joint, or muscle pain -  fever -hair loss -irritation at site where injected -loss of appetite -nausea, vomiting -stomach upset -tired This list may not describe all possible side effects. Call your doctor for medical advice about side effects. You may report side effects to FDA at  1-800-FDA-1088. Where should I keep my medicine? This drug is given in a hospital or clinic and will not be stored at home. NOTE: This sheet is a summary. It may not cover all possible information. If you have questions about this medicine, talk to your doctor, pharmacist, or health care provider.  2012, [employer]. (04/16/2011 9:06:58 AM)

## 2013-03-09 ENCOUNTER — Telehealth: Payer: Self-pay | Admitting: Internal Medicine

## 2013-03-09 ENCOUNTER — Other Ambulatory Visit: Payer: Self-pay | Admitting: Physician Assistant

## 2013-03-09 DIAGNOSIS — C7951 Secondary malignant neoplasm of bone: Secondary | ICD-10-CM

## 2013-03-09 NOTE — Telephone Encounter (Signed)
Pt came today to get schedule. Pt on schedule for monthly zometa but no lbs. Checked w/AJ and added lbs for May and June w/inf. Pt given schedule for May thru July.

## 2013-03-28 ENCOUNTER — Ambulatory Visit (HOSPITAL_BASED_OUTPATIENT_CLINIC_OR_DEPARTMENT_OTHER): Payer: Medicare Other

## 2013-03-28 ENCOUNTER — Other Ambulatory Visit (HOSPITAL_BASED_OUTPATIENT_CLINIC_OR_DEPARTMENT_OTHER): Payer: Medicare Other | Admitting: Lab

## 2013-03-28 VITALS — BP 139/49 | HR 82 | Temp 97.7°F | Resp 20

## 2013-03-28 DIAGNOSIS — C50919 Malignant neoplasm of unspecified site of unspecified female breast: Secondary | ICD-10-CM

## 2013-03-28 DIAGNOSIS — C7951 Secondary malignant neoplasm of bone: Secondary | ICD-10-CM

## 2013-03-28 DIAGNOSIS — C7952 Secondary malignant neoplasm of bone marrow: Secondary | ICD-10-CM

## 2013-03-28 DIAGNOSIS — C50911 Malignant neoplasm of unspecified site of right female breast: Secondary | ICD-10-CM

## 2013-03-28 LAB — BASIC METABOLIC PANEL (CC13)
BUN: 12.8 mg/dL (ref 7.0–26.0)
CO2: 23 mEq/L (ref 22–29)
Chloride: 107 mEq/L (ref 98–107)
Glucose: 96 mg/dl (ref 70–99)
Potassium: 3.9 mEq/L (ref 3.5–5.1)
Sodium: 140 mEq/L (ref 136–145)

## 2013-03-28 MED ORDER — SODIUM CHLORIDE 0.9 % IJ SOLN
10.0000 mL | INTRAMUSCULAR | Status: DC | PRN
  Administered 2013-03-28: 10 mL via INTRAVENOUS
  Filled 2013-03-28: qty 10

## 2013-03-28 MED ORDER — HEPARIN SOD (PORK) LOCK FLUSH 100 UNIT/ML IV SOLN
500.0000 [IU] | Freq: Once | INTRAVENOUS | Status: AC
  Administered 2013-03-28: 500 [IU] via INTRAVENOUS
  Filled 2013-03-28: qty 5

## 2013-03-28 MED ORDER — ZOLEDRONIC ACID 4 MG/5ML IV CONC
4.0000 mg | Freq: Once | INTRAVENOUS | Status: AC
  Administered 2013-03-28: 4 mg via INTRAVENOUS
  Filled 2013-03-28: qty 5

## 2013-03-28 NOTE — Patient Instructions (Addendum)
Zoledronic Acid injection (Hypercalcemia, Oncology) What is this medicine? ZOLEDRONIC ACID (ZOE le dron ik AS id) lowers the amount of calcium loss from bone. It is used to treat too much calcium in your blood from cancer. It is also used to prevent complications of cancer that has spread to the bone. This medicine may be used for other purposes; ask your health care provider or pharmacist if you have questions. What should I tell my health care provider before I take this medicine? They need to know if you have any of these conditions: -aspirin-sensitive asthma -dental disease -kidney disease -an unusual or allergic reaction to zoledronic acid, other medicines, foods, dyes, or preservatives -pregnant or trying to get pregnant -breast-feeding How should I use this medicine? This medicine is for infusion into a vein. It is given by a health care professional in a hospital or clinic setting. Talk to your pediatrician regarding the use of this medicine in children. Special care may be needed. Overdosage: If you think you have taken too much of this medicine contact a poison control center or emergency room at once. NOTE: This medicine is only for you. Do not share this medicine with others. What if I miss a dose? It is important not to miss your dose. Call your doctor or health care professional if you are unable to keep an appointment. What may interact with this medicine? -certain antibiotics given by injection -NSAIDs, medicines for pain and inflammation, like ibuprofen or naproxen -some diuretics like bumetanide, furosemide -teriparatide -thalidomide This list may not describe all possible interactions. Give your health care provider a list of all the medicines, herbs, non-prescription drugs, or dietary supplements you use. Also tell them if you smoke, drink alcohol, or use illegal drugs. Some items may interact with your medicine. What should I watch for while using this medicine? Visit  your doctor or health care professional for regular checkups. It may be some time before you see the benefit from this medicine. Do not stop taking your medicine unless your doctor tells you to. Your doctor may order blood tests or other tests to see how you are doing. Women should inform their doctor if they wish to become pregnant or think they might be pregnant. There is a potential for serious side effects to an unborn child. Talk to your health care professional or pharmacist for more information. You should make sure that you get enough calcium and vitamin D while you are taking this medicine. Discuss the foods you eat and the vitamins you take with your health care professional. Some people who take this medicine have severe bone, joint, and/or muscle pain. This medicine may also increase your risk for a broken thigh bone. Tell your doctor right away if you have pain in your upper leg or groin. Tell your doctor if you have any pain that does not go away or that gets worse. What side effects may I notice from receiving this medicine? Side effects that you should report to your doctor or health care professional as soon as possible: -allergic reactions like skin rash, itching or hives, swelling of the face, lips, or tongue -anxiety, confusion, or depression -breathing problems -changes in vision -feeling faint or lightheaded, falls -jaw burning, cramping, pain -muscle cramps, stiffness, or weakness -trouble passing urine or change in the amount of urine Side effects that usually do not require medical attention (report to your doctor or health care professional if they continue or are bothersome): -bone, joint, or muscle pain -  fever -hair loss -irritation at site where injected -loss of appetite -nausea, vomiting -stomach upset -tired This list may not describe all possible side effects. Call your doctor for medical advice about side effects. You may report side effects to FDA at  1-800-FDA-1088. Where should I keep my medicine? This drug is given in a hospital or clinic and will not be stored at home. NOTE: This sheet is a summary. It may not cover all possible information. If you have questions about this medicine, talk to your doctor, pharmacist, or health care provider.  2012, [employer]. (04/16/2011 9:06:58 AM)

## 2013-04-02 ENCOUNTER — Encounter (HOSPITAL_COMMUNITY): Payer: Self-pay | Admitting: Emergency Medicine

## 2013-04-02 ENCOUNTER — Telehealth: Payer: Self-pay | Admitting: *Deleted

## 2013-04-02 ENCOUNTER — Emergency Department (HOSPITAL_COMMUNITY)
Admission: EM | Admit: 2013-04-02 | Discharge: 2013-04-02 | Disposition: A | Discharge status: Home / Self Care | Payer: Medicare Other | Attending: Emergency Medicine | Admitting: Emergency Medicine

## 2013-04-02 ENCOUNTER — Emergency Department (HOSPITAL_COMMUNITY): Payer: Medicare Other

## 2013-04-02 DIAGNOSIS — E785 Hyperlipidemia, unspecified: Secondary | ICD-10-CM | POA: Insufficient documentation

## 2013-04-02 DIAGNOSIS — M25469 Effusion, unspecified knee: Secondary | ICD-10-CM | POA: Diagnosis not present

## 2013-04-02 DIAGNOSIS — M25569 Pain in unspecified knee: Secondary | ICD-10-CM | POA: Diagnosis not present

## 2013-04-02 DIAGNOSIS — Z79899 Other long term (current) drug therapy: Secondary | ICD-10-CM | POA: Insufficient documentation

## 2013-04-02 DIAGNOSIS — I10 Essential (primary) hypertension: Secondary | ICD-10-CM | POA: Diagnosis not present

## 2013-04-02 DIAGNOSIS — M1710 Unilateral primary osteoarthritis, unspecified knee: Secondary | ICD-10-CM | POA: Diagnosis not present

## 2013-04-02 DIAGNOSIS — Z8673 Personal history of transient ischemic attack (TIA), and cerebral infarction without residual deficits: Secondary | ICD-10-CM | POA: Diagnosis not present

## 2013-04-02 DIAGNOSIS — E119 Type 2 diabetes mellitus without complications: Secondary | ICD-10-CM | POA: Diagnosis not present

## 2013-04-02 DIAGNOSIS — Z853 Personal history of malignant neoplasm of breast: Secondary | ICD-10-CM | POA: Diagnosis not present

## 2013-04-02 DIAGNOSIS — Z87891 Personal history of nicotine dependence: Secondary | ICD-10-CM | POA: Diagnosis not present

## 2013-04-02 DIAGNOSIS — Z8659 Personal history of other mental and behavioral disorders: Secondary | ICD-10-CM | POA: Insufficient documentation

## 2013-04-02 DIAGNOSIS — IMO0002 Reserved for concepts with insufficient information to code with codable children: Secondary | ICD-10-CM | POA: Diagnosis not present

## 2013-04-02 DIAGNOSIS — Z8739 Personal history of other diseases of the musculoskeletal system and connective tissue: Secondary | ICD-10-CM | POA: Insufficient documentation

## 2013-04-02 DIAGNOSIS — J45909 Unspecified asthma, uncomplicated: Secondary | ICD-10-CM | POA: Diagnosis not present

## 2013-04-02 DIAGNOSIS — M1711 Unilateral primary osteoarthritis, right knee: Secondary | ICD-10-CM

## 2013-04-02 MED ORDER — TRAMADOL HCL 50 MG PO TABS: 50.0000 mg | ORAL_TABLET | Freq: Four times a day (QID) | ORAL | Status: DC | PRN

## 2013-04-02 NOTE — ED Notes (Signed)
Pt states that she has been having rt knee pain for a few days and thinks that her cancer has returned and is effecting her knee. Denies any injury, no swelling noted.

## 2013-04-02 NOTE — ED Provider Notes (Signed)
History     CSN: [id number]  Arrival date & time 04/02/13  59   First MD Initiated Contact with Patient 04/02/13 1706      Chief Complaint  Patient presents with  . Knee Pain    (Consider location/radiation/quality/duration/timing/severity/associated sxs/prior treatment) HPI Pt admits to 2 weeks of R pain. Worse with ambulation. No trauma. No warmth. +occasional mild swelling. No claf swelling or pain. No fever or chills.  Past Medical History  Diagnosis Date  . Asthma   . Hypertension   . Diabetes mellitus type II   . Cerebrovascular accident     R thalamic CVA 01/2010  . Hyperlipemia   . Dyslipidemia   . TIA (transient ischemic attack)     x2 in 2011  . Low back pain   . Osteoarthritis   . Anxiety   . Breast cancer     2002; metastatic in 2012    Past Surgical History  Procedure Laterality Date  . Breast lumpectomy      Right breast 2002  . Joint replacement      L hip due to breast ca met    Family History  Problem Relation Age of Onset  . Hypertension Mother   . Hypertension Father     History  Substance Use Topics  . Smoking status: Former Smoker    Quit date: 02/12/2011  . Smokeless tobacco: Not on file  . Alcohol Use: No    OB History   Grav Para Term Preterm Abortions TAB SAB Ect Mult Living                  Review of Systems  Constitutional: Negative for fever and chills.  Respiratory: Negative for shortness of breath.   Cardiovascular: Negative for chest pain.  Gastrointestinal: Negative for nausea, vomiting and abdominal pain.  Musculoskeletal: Positive for joint swelling and arthralgias. Negative for back pain.  Skin: Negative for rash.  Neurological: Negative for dizziness, weakness, light-headedness and numbness.  All other systems reviewed and are negative.    Allergies  Atorvastatin  Home Medications   Current Outpatient Rx  Name  Route  Sig  Dispense  Refill  . albuterol (VENTOLIN HFA) 108 (90 BASE) MCG/ACT inhaler    Inhalation   Inhale 2 puffs into the lungs 4 (four) times daily.           Marland Kitchen amLODipine (NORVASC) 10 MG tablet   Oral   Take 1 tablet (10 mg total) by mouth daily.   90 tablet   3   . anastrozole (ARIMIDEX) 1 MG tablet   Oral   Take 1 tablet (1 mg total) by mouth daily.   90 tablet   2   . Cholecalciferol (VITAMIN D3) 1000 UNITS CAPS   Oral   Take by mouth.           . Fluticasone-Salmeterol (ADVAIR DISKUS) 250-50 MCG/DOSE AEPB   Inhalation   Inhale 1 puff into the lungs as needed.           Marland Kitchen ibuprofen (ADVIL,MOTRIN) 200 MG tablet   Oral   Take 200 mg by mouth every 6 (six) hours as needed for pain.         Marland Kitchen losartan-hydrochlorothiazide (HYZAAR) 100-25 MG per tablet   Oral   Take 1 tablet by mouth daily.   90 tablet   3   . traMADol (ULTRAM) 50 MG tablet   Oral   Take 1 tablet (50 mg total) by mouth every  6 (six) hours as needed for pain.   15 tablet   0     BP 127/51  Pulse 71  Temp(Src) 97.6 F (36.4 C) (Oral)  Resp 18  SpO2 100%  Physical Exam  Nursing note and vitals reviewed. Constitutional: She is oriented to person, place, and time. She appears well-developed and well-nourished. No distress.  HENT:  Head: Normocephalic and atraumatic.  Mouth/Throat: Oropharynx is clear and moist.  Eyes: EOM are normal. Pupils are equal, round, and reactive to light.  Neck: Normal range of motion. Neck supple.  Cardiovascular: Normal rate and regular rhythm.   Pulmonary/Chest: Effort normal and breath sounds normal. No respiratory distress. She has no wheezes. She has no rales.  Abdominal: Soft. Bowel sounds are normal. She exhibits no distension and no mass. There is no tenderness. There is no rebound and no guarding.  Musculoskeletal: Normal range of motion. She exhibits no edema and no tenderness.  Mild crepitance R knee. No ligamentous instability. 2+DP. No calf swelling or pain.   Neurological: She is alert and oriented to person, place, and time.   Skin: Skin is warm and dry. No rash noted. No erythema.  Psychiatric: She has a normal mood and affect. Her behavior is normal.    ED Course  Procedures (including critical care time)  Labs Reviewed - No data to display Dg Knee Complete 4 Views Right  04/02/2013   *RADIOLOGY REPORT*  Clinical Data: Right lateral knee pain, no trauma  RIGHT KNEE - COMPLETE 4+ VIEW  Comparison: 04/12/2012  Findings: Minimal tibial spine spurring noted.  Mild tricompartmental degenerative change noted.  Lateral view is suboptimal due to flexion of the knee.  Trace if any suprapatellar fluid.  No fracture or dislocation.  No radiopaque foreign body.  IMPRESSION: No acute osseous abnormality of the right knee.   Original Report Authenticated By: Conchita Paris, M.D.     1. Osteoarthritis of right knee       MDM  Advised ortho f/u for continued discomfort.         Julianne Rice, MD 04/02/13 [id number]

## 2013-04-02 NOTE — Telephone Encounter (Signed)
Called and advised to drink fluids, she verbalized understanding.  She also stated that she has had increased pain in her right knee x 2 weeks.  She says the pain is 6/10.  Advised she take tylenol or ibuprofen, will discuss with Dr Vista Mink and call back regarding further orders.  SLJ

## 2013-04-02 NOTE — Telephone Encounter (Signed)
Message copied by Britt Bottom on Mon Apr 02, 2013 10:03 AM ------      Message from: [name] [name]      Created: Thu Mar 29, 2013  9:27 AM       Abnormal results, please call and notify patient to increase by mouth fluid intake ------

## 2013-04-03 ENCOUNTER — Telehealth: Payer: Self-pay | Admitting: *Deleted

## 2013-04-03 NOTE — Telephone Encounter (Signed)
Pt called left msg stating that she has had increased pain in her right knee over the past 2 weeks.  She states it feels the way it has felt when they have found cancer in the bone.  Per Dr Vista Mink, okay to do bone scan to evaluate whether she has a new bone met.  Called and left message on mobile # to call back, attempted to call home # but unable to leave message because mailbox was full.  SLJ

## 2013-04-04 ENCOUNTER — Encounter: Payer: Self-pay | Admitting: Internal Medicine

## 2013-04-04 ENCOUNTER — Ambulatory Visit (INDEPENDENT_AMBULATORY_CARE_PROVIDER_SITE_OTHER): Payer: Medicare Other | Admitting: Internal Medicine

## 2013-04-04 VITALS — BP 160/80 | HR 72 | Temp 98.1°F | Resp 16 | Wt 227.0 lb

## 2013-04-04 DIAGNOSIS — R799 Abnormal finding of blood chemistry, unspecified: Secondary | ICD-10-CM

## 2013-04-04 DIAGNOSIS — M25569 Pain in unspecified knee: Secondary | ICD-10-CM | POA: Diagnosis not present

## 2013-04-04 DIAGNOSIS — R635 Abnormal weight gain: Secondary | ICD-10-CM | POA: Diagnosis not present

## 2013-04-04 DIAGNOSIS — M25561 Pain in right knee: Secondary | ICD-10-CM

## 2013-04-04 DIAGNOSIS — K219 Gastro-esophageal reflux disease without esophagitis: Secondary | ICD-10-CM | POA: Diagnosis not present

## 2013-04-04 DIAGNOSIS — R7989 Other specified abnormal findings of blood chemistry: Secondary | ICD-10-CM | POA: Insufficient documentation

## 2013-04-04 DIAGNOSIS — E559 Vitamin D deficiency, unspecified: Secondary | ICD-10-CM

## 2013-04-04 DIAGNOSIS — IMO0002 Reserved for concepts with insufficient information to code with codable children: Secondary | ICD-10-CM

## 2013-04-04 MED ORDER — PANTOPRAZOLE SODIUM 40 MG PO TBEC: 40.0000 mg | DELAYED_RELEASE_TABLET | Freq: Every day | ORAL | Status: DC

## 2013-04-04 MED ORDER — TRAMADOL HCL 50 MG PO TABS: 50.0000 mg | ORAL_TABLET | Freq: Four times a day (QID) | ORAL | Status: DC | PRN

## 2013-04-04 MED ORDER — TRIAMCINOLONE ACETONIDE 0.5 % EX CREA: TOPICAL_CREAM | Freq: Three times a day (TID) | CUTANEOUS | Status: DC

## 2013-04-04 MED ORDER — ALBUTEROL SULFATE HFA 108 (90 BASE) MCG/ACT IN AERS: 2.0000 | INHALATION_SPRAY | Freq: Four times a day (QID) | RESPIRATORY_TRACT | Status: DC | PRN

## 2013-04-04 MED ORDER — IBUPROFEN 200 MG PO TABS: 400.0000 mg | ORAL_TABLET | Freq: Four times a day (QID) | ORAL | Status: DC | PRN

## 2013-04-04 MED ORDER — TRAMADOL HCL 50 MG PO TABS: 50.0000 mg | ORAL_TABLET | Freq: Two times a day (BID) | ORAL | Status: DC | PRN

## 2013-04-04 MED ORDER — FLUTICASONE-SALMETEROL 250-50 MCG/DOSE IN AEPB: 1.0000 | INHALATION_SPRAY | RESPIRATORY_TRACT | Status: DC | PRN

## 2013-04-04 NOTE — Assessment & Plan Note (Signed)
Wt Readings from Last 3 Encounters:  04/04/13 227 lb (102.967 kg)  02/26/13 227 lb 12.8 oz (103.329 kg)  11/16/12 235 lb (106.595 kg)

## 2013-04-04 NOTE — Assessment & Plan Note (Addendum)
6/14 R -- OA Tramadol prn Aleve prn Will inject if not better

## 2013-04-04 NOTE — Progress Notes (Signed)
Subjective:    Patient ID: [name] [name], female    DOB: 03-Jul-1941, 72 y.o.   MRN: [id number]  HPI  C/o DOE - not new  The patient presents for a follow-up of  chronic hypertension, chronic dyslipidemia, type 2 diabetes controlled with medicines C/o R knee pain x 2 wks; went to ER -- Xray w/OA F/u breast cancer w/mets F/u GERD- not  using Prilosec - can't swallow it  BP Readings from Last 3 Encounters:  04/04/13 160/80  04/02/13 149/70  03/28/13 139/49   Wt Readings from Last 3 Encounters:  04/04/13 227 lb (102.967 kg)  02/26/13 227 lb 12.8 oz (103.329 kg)  11/16/12 235 lb (106.595 kg)      Review of Systems  Constitutional: Negative for chills, activity change, appetite change, fatigue and unexpected weight change.  HENT: Negative for congestion, mouth sores and sinus pressure.   Eyes: Negative for visual disturbance.  Respiratory: Negative for cough and chest tightness.   Cardiovascular: Negative for palpitations.  Gastrointestinal: Negative for nausea.  Genitourinary: Negative for frequency, difficulty urinating and vaginal pain.  Musculoskeletal: Positive for arthralgias and gait problem. Negative for back pain.  Skin: Negative for pallor.  Neurological: Negative for dizziness, tremors, weakness and numbness.  Psychiatric/Behavioral: Negative for confusion and sleep disturbance.  no black ctools      Objective:   Physical Exam  Constitutional: She appears well-developed. No distress.  obese  HENT:  Head: Normocephalic.  Right Ear: External ear normal.  Left Ear: External ear normal.  Nose: Nose normal.  Mouth/Throat: Oropharynx is clear and moist.  Eyes: Conjunctivae are normal. Pupils are equal, round, and reactive to light. Right eye exhibits no discharge. Left eye exhibits no discharge.  Neck: Normal range of motion. Neck supple. No JVD present. No tracheal deviation present. No thyromegaly present.  Cardiovascular: Normal rate, regular rhythm and  normal heart sounds.   Pulmonary/Chest: No stridor. No respiratory distress. She has no wheezes.  Abdominal: Soft. Bowel sounds are normal. She exhibits no distension and no mass. There is no tenderness. There is no rebound and no guarding.  Musculoskeletal: She exhibits tenderness. She exhibits no edema.  R knee is tender  Lymphadenopathy:    She has no cervical adenopathy.  Neurological: She displays normal reflexes. No cranial nerve deficit. She exhibits normal muscle tone. Coordination normal.  cane  Skin: No rash noted. No erythema.  Psychiatric: She has a normal mood and affect. Her behavior is normal. Judgment and thought content normal.  No palor   Lab Results  Component Value Date   WBC 5.7 02/02/2012   HGB 12.2 02/02/2012   HCT 37.5 02/02/2012   PLT 239 02/02/2012   GLUCOSE 109* 02/02/2012   CHOL  Value: 187        ATP III CLASSIFICATION:  <200     mg/dL   Desirable  200-239  mg/dL   Borderline High  >=240    mg/dL   High        02/14/2010   TRIG 61 02/14/2010   HDL 52 02/14/2010   LDLCALC  Value: 123        Total Cholesterol/HDL:CHD Risk Coronary Heart Disease Risk Table                     Men   Women  1/2 Average Risk   3.4   3.3  Average Risk       5.0   4.4  2 X Average Risk  9.6   7.1  3 X Average Risk  23.4   11.0        Use the calculated Patient Ratio above and the CHD Risk Table to determine the patient's CHD Risk.        ATP III CLASSIFICATION (LDL):  <100     mg/dL   Optimal  100-129  mg/dL   Near or Above                    Optimal  130-159  mg/dL   Borderline  160-189  mg/dL   High  >190     mg/dL   Very High* 02/14/2010   ALT 10 02/02/2012   AST 14 02/02/2012   NA 137 02/02/2012   K 4.0 02/02/2012   CL 101 02/02/2012   CREATININE 1.16* 02/02/2012   BUN 17 02/02/2012   CO2 21 02/02/2012   TSH 1.916 **Test methodology is 3rd generation TSH** 02/15/2010   INR 1.10 04/05/2011   HGBA1C  Value: 6.4 (NOTE)                                                                       According to the ADA  Clinical Practice Recommendations for 2011, when HbA1c is used as a screening test:   >=6.5%   Diagnostic of Diabetes Mellitus           (if abnormal result  is confirmed)  5.7-6.4%   Increased risk of developing Diabetes Mellitus  References:Diagnosis and Classification of Diabetes Mellitus,Diabetes [id number] 1):S62-S69 and Standards of Medical Care in         Diabetes - 2011,Diabetes Care,2011,34  (Suppl 1):S11-S61.* 02/15/2010        Assessment & Plan:

## 2013-04-04 NOTE — Assessment & Plan Note (Signed)
Change to protonix - a smaller tablet

## 2013-04-04 NOTE — Assessment & Plan Note (Signed)
Continue with current prescription therapy as reflected on the Med list.  

## 2013-04-04 NOTE — Telephone Encounter (Signed)
Called and spoke to pt, she stated she ended up going to the ED and the pain in her knee appears to be r/t arthritis.  She is going to f/u with her PCP.  Asked her to keep up informed if we need to do anything further.  She verbalized understanding.  SLJ

## 2013-04-04 NOTE — Assessment & Plan Note (Signed)
Will watch 

## 2013-04-16 ENCOUNTER — Encounter: Payer: Self-pay | Admitting: Internal Medicine

## 2013-04-18 ENCOUNTER — Encounter: Payer: Self-pay | Admitting: Internal Medicine

## 2013-04-25 ENCOUNTER — Other Ambulatory Visit (HOSPITAL_BASED_OUTPATIENT_CLINIC_OR_DEPARTMENT_OTHER): Payer: Medicare Other | Admitting: Lab

## 2013-04-25 ENCOUNTER — Ambulatory Visit (HOSPITAL_COMMUNITY)
Admission: RE | Admit: 2013-04-25 | Discharge: 2013-04-25 | Disposition: A | Discharge status: Home / Self Care | Payer: Medicare Other | Source: Ambulatory Visit | Attending: Physician Assistant | Admitting: Physician Assistant

## 2013-04-25 ENCOUNTER — Ambulatory Visit (HOSPITAL_BASED_OUTPATIENT_CLINIC_OR_DEPARTMENT_OTHER): Payer: Medicare Other

## 2013-04-25 ENCOUNTER — Telehealth: Payer: Self-pay | Admitting: Medical Oncology

## 2013-04-25 ENCOUNTER — Other Ambulatory Visit: Payer: Self-pay | Admitting: Internal Medicine

## 2013-04-25 ENCOUNTER — Other Ambulatory Visit: Payer: Self-pay | Admitting: Physician Assistant

## 2013-04-25 VITALS — BP 164/67 | HR 81 | Temp 97.6°F | Resp 20

## 2013-04-25 DIAGNOSIS — C50919 Malignant neoplasm of unspecified site of unspecified female breast: Secondary | ICD-10-CM

## 2013-04-25 DIAGNOSIS — M25569 Pain in unspecified knee: Secondary | ICD-10-CM | POA: Insufficient documentation

## 2013-04-25 DIAGNOSIS — C773 Secondary and unspecified malignant neoplasm of axilla and upper limb lymph nodes: Secondary | ICD-10-CM | POA: Diagnosis not present

## 2013-04-25 DIAGNOSIS — C7951 Secondary malignant neoplasm of bone: Secondary | ICD-10-CM

## 2013-04-25 DIAGNOSIS — C801 Malignant (primary) neoplasm, unspecified: Secondary | ICD-10-CM | POA: Diagnosis not present

## 2013-04-25 DIAGNOSIS — C7952 Secondary malignant neoplasm of bone marrow: Secondary | ICD-10-CM

## 2013-04-25 DIAGNOSIS — M25561 Pain in right knee: Secondary | ICD-10-CM

## 2013-04-25 LAB — BASIC METABOLIC PANEL (CC13)
BUN: 9.7 mg/dL (ref 7.0–26.0)
CO2: 25 mEq/L (ref 22–29)
Calcium: 9.5 mg/dL (ref 8.4–10.4)
Glucose: 108 mg/dl — ABNORMAL HIGH (ref 70–99)

## 2013-04-25 MED ORDER — SODIUM CHLORIDE 0.9 % IJ SOLN
10.0000 mL | INTRAMUSCULAR | Status: DC | PRN
  Administered 2013-04-25: 10 mL via INTRAVENOUS
  Filled 2013-04-25: qty 10

## 2013-04-25 MED ORDER — HEPARIN SOD (PORK) LOCK FLUSH 100 UNIT/ML IV SOLN
500.0000 [IU] | Freq: Once | INTRAVENOUS | Status: AC
  Administered 2013-04-25: 500 [IU] via INTRAVENOUS
  Filled 2013-04-25: qty 5

## 2013-04-25 MED ORDER — ZOLEDRONIC ACID 4 MG/100ML IV SOLN
4.0000 mg | Freq: Once | INTRAVENOUS | Status: AC
  Administered 2013-04-25: 4 mg via INTRAVENOUS
  Filled 2013-04-25: qty 100

## 2013-04-25 NOTE — Telephone Encounter (Signed)
Adrena ordered xray of knee.

## 2013-05-28 ENCOUNTER — Telehealth: Payer: Self-pay | Admitting: *Deleted

## 2013-05-28 ENCOUNTER — Ambulatory Visit (HOSPITAL_BASED_OUTPATIENT_CLINIC_OR_DEPARTMENT_OTHER): Payer: Medicare Other | Admitting: Internal Medicine

## 2013-05-28 ENCOUNTER — Telehealth: Payer: Self-pay | Admitting: Internal Medicine

## 2013-05-28 ENCOUNTER — Encounter: Payer: Self-pay | Admitting: Internal Medicine

## 2013-05-28 ENCOUNTER — Ambulatory Visit (HOSPITAL_BASED_OUTPATIENT_CLINIC_OR_DEPARTMENT_OTHER): Payer: Medicare Other

## 2013-05-28 ENCOUNTER — Other Ambulatory Visit (HOSPITAL_BASED_OUTPATIENT_CLINIC_OR_DEPARTMENT_OTHER): Payer: Medicare Other | Admitting: Lab

## 2013-05-28 VITALS — BP 145/80 | HR 81 | Temp 97.4°F | Resp 20 | Ht 64.0 in | Wt 223.6 lb

## 2013-05-28 DIAGNOSIS — M25569 Pain in unspecified knee: Secondary | ICD-10-CM

## 2013-05-28 DIAGNOSIS — C7952 Secondary malignant neoplasm of bone marrow: Secondary | ICD-10-CM

## 2013-05-28 DIAGNOSIS — C50919 Malignant neoplasm of unspecified site of unspecified female breast: Secondary | ICD-10-CM

## 2013-05-28 DIAGNOSIS — C7951 Secondary malignant neoplasm of bone: Secondary | ICD-10-CM

## 2013-05-28 DIAGNOSIS — Z170 Estrogen receptor positive status [ER+]: Secondary | ICD-10-CM | POA: Diagnosis not present

## 2013-05-28 LAB — CBC WITH DIFFERENTIAL/PLATELET
BASO%: 0.1 % (ref 0.0–2.0)
EOS%: 1.6 % (ref 0.0–7.0)
HCT: 37.2 % (ref 34.8–46.6)
LYMPH%: 24.8 % (ref 14.0–49.7)
MCH: 28.3 pg (ref 25.1–34.0)
MCHC: 32.5 g/dL (ref 31.5–36.0)
MONO#: 0.7 10*3/uL (ref 0.1–0.9)
NEUT%: 62.9 % (ref 38.4–76.8)
Platelets: 254 10*3/uL (ref 145–400)
RBC: 4.28 10*6/uL (ref 3.70–5.45)
WBC: 6.8 10*3/uL (ref 3.9–10.3)
lymph#: 1.7 10*3/uL (ref 0.9–3.3)

## 2013-05-28 LAB — COMPREHENSIVE METABOLIC PANEL (CC13)
ALT: 6 U/L (ref 0–55)
AST: 13 U/L (ref 5–34)
Alkaline Phosphatase: 100 U/L (ref 40–150)
BUN: 13.1 mg/dL (ref 7.0–26.0)
Chloride: 106 mEq/L (ref 98–109)
Creatinine: 1.3 mg/dL — ABNORMAL HIGH (ref 0.6–1.1)
Total Bilirubin: 0.36 mg/dL (ref 0.20–1.20)

## 2013-05-28 LAB — CANCER ANTIGEN 27.29: CA 27.29: 52 U/mL — ABNORMAL HIGH (ref 0–39)

## 2013-05-28 MED ORDER — SODIUM CHLORIDE 0.9 % IJ SOLN
10.0000 mL | INTRAMUSCULAR | Status: DC | PRN
  Administered 2013-05-28: 10 mL
  Filled 2013-05-28: qty 10

## 2013-05-28 MED ORDER — SODIUM CHLORIDE 0.9 % IJ SOLN
10.0000 mL | INTRAMUSCULAR | Status: DC | PRN
  Filled 2013-05-28: qty 10

## 2013-05-28 MED ORDER — HEPARIN SOD (PORK) LOCK FLUSH 100 UNIT/ML IV SOLN
500.0000 [IU] | Freq: Once | INTRAVENOUS | Status: AC
  Administered 2013-05-28: 500 [IU] via INTRAVENOUS
  Filled 2013-05-28: qty 5

## 2013-05-28 MED ORDER — ZOLEDRONIC ACID 4 MG/5ML IV CONC
4.0000 mg | Freq: Once | INTRAVENOUS | Status: AC
  Administered 2013-05-28: 4 mg via INTRAVENOUS
  Filled 2013-05-28: qty 5

## 2013-05-28 MED ORDER — SODIUM CHLORIDE 0.9 % IV SOLN
Freq: Once | INTRAVENOUS | Status: AC
  Administered 2013-05-28: 15:00:00 via INTRAVENOUS

## 2013-05-28 MED ORDER — HEPARIN SOD (PORK) LOCK FLUSH 100 UNIT/ML IV SOLN
250.0000 [IU] | Freq: Once | INTRAVENOUS | Status: DC | PRN
  Filled 2013-05-28: qty 5

## 2013-05-28 NOTE — Telephone Encounter (Signed)
Gave pt appt for lab and MD emailed [name] [name] regarding chemo for Zometa, every month

## 2013-05-28 NOTE — Telephone Encounter (Signed)
Per staff message and POF I have scheduled appts.  JMW  

## 2013-05-28 NOTE — Patient Instructions (Addendum)
Zoledronic Acid injection (Hypercalcemia, Oncology) What is this medicine? ZOLEDRONIC ACID (ZOE le dron ik AS id) lowers the amount of calcium loss from bone. It is used to treat too much calcium in your blood from cancer. It is also used to prevent complications of cancer that has spread to the bone. This medicine may be used for other purposes; ask your health care provider or pharmacist if you have questions. What should I tell my health care provider before I take this medicine? They need to know if you have any of these conditions: -aspirin-sensitive asthma -dental disease -kidney disease -an unusual or allergic reaction to zoledronic acid, other medicines, foods, dyes, or preservatives -pregnant or trying to get pregnant -breast-feeding How should I use this medicine? This medicine is for infusion into a vein. It is given by a health care professional in a hospital or clinic setting. Talk to your pediatrician regarding the use of this medicine in children. Special care may be needed. Overdosage: If you think you have taken too much of this medicine contact a poison control center or emergency room at once. NOTE: This medicine is only for you. Do not share this medicine with others. What if I miss a dose? It is important not to miss your dose. Call your doctor or health care professional if you are unable to keep an appointment. What may interact with this medicine? -certain antibiotics given by injection -NSAIDs, medicines for pain and inflammation, like ibuprofen or naproxen -some diuretics like bumetanide, furosemide -teriparatide -thalidomide This list may not describe all possible interactions. Give your health care provider a list of all the medicines, herbs, non-prescription drugs, or dietary supplements you use. Also tell them if you smoke, drink alcohol, or use illegal drugs. Some items may interact with your medicine. What should I watch for while using this medicine? Visit  your doctor or health care professional for regular checkups. It may be some time before you see the benefit from this medicine. Do not stop taking your medicine unless your doctor tells you to. Your doctor may order blood tests or other tests to see how you are doing. Women should inform their doctor if they wish to become pregnant or think they might be pregnant. There is a potential for serious side effects to an unborn child. Talk to your health care professional or pharmacist for more information. You should make sure that you get enough calcium and vitamin D while you are taking this medicine. Discuss the foods you eat and the vitamins you take with your health care professional. Some people who take this medicine have severe bone, joint, and/or muscle pain. This medicine may also increase your risk for a broken thigh bone. Tell your doctor right away if you have pain in your upper leg or groin. Tell your doctor if you have any pain that does not go away or that gets worse. What side effects may I notice from receiving this medicine? Side effects that you should report to your doctor or health care professional as soon as possible: -allergic reactions like skin rash, itching or hives, swelling of the face, lips, or tongue -anxiety, confusion, or depression -breathing problems -changes in vision -feeling faint or lightheaded, falls -jaw burning, cramping, pain -muscle cramps, stiffness, or weakness -trouble passing urine or change in the amount of urine Side effects that usually do not require medical attention (report to your doctor or health care professional if they continue or are bothersome): -bone, joint, or muscle pain -  fever -hair loss -irritation at site where injected -loss of appetite -nausea, vomiting -stomach upset -tired This list may not describe all possible side effects. Call your doctor for medical advice about side effects. You may report side effects to FDA at  1-800-FDA-1088. Where should I keep my medicine? This drug is given in a hospital or clinic and will not be stored at home. NOTE: This sheet is a summary. It may not cover all possible information. If you have questions about this medicine, talk to your doctor, pharmacist, or health care provider.  2013, [employer]vier/Gold Standard. (04/16/2011 9:06:58 AM)

## 2013-05-28 NOTE — Patient Instructions (Addendum)
CHEMOTHERAPY INTENT: Palliative  CURRENT # OF HORMONAL THERAPY CYCLES: 26  CURRENT ANTIEMETICS: None  CURRENT SMOKING STATUS: Nonsmoker  ORAL HORMONAL THERAPY AND CONSENT: Yes.  CURRENT BISPHOSPHONATES USE: Zometa with stable renal function.  PAIN MANAGEMENT: Tramadol when necessary  NARCOTICS INDUCED CONSTIPATION: None  LIVING WILL AND CODE STATUS: Full code.

## 2013-05-28 NOTE — Progress Notes (Signed)
[facility] [phone]   [phone]  OFFICE PROGRESS NOTE  Walker Kehr, MD [address] [facility] [place] [address]  DIAGNOSIS AND STAGE: Metastatic breast adenocarcinoma, presenting with bone metastasis in the left hip area. This was initially diagnosed as stage II (T2, N1, M0) invasive right breast carcinoma with positive estrogen and progesterone receptors and HER-2/neu positive in July 2002   PRIOR THERAPY:  1. Status post right lumpectomy with right axillary lymph node dissection revealing 4/9 lymph nodes that were positive for malignancy.  2. Status post 8 cycles of systemic chemotherapy with CMF, completed March 2003.  3. Status post radiotherapy to the remaining right breast under the care of Dr. Tammi Klippel.  4. Status post 2 years of treatment with tamoxifen, started July 2003, then switched to Femara by Dr. Humphrey Rolls in August 2005. The patient discontinued the treatment in February 2009.  5. Status post left total hip replacement on Apr 01, 2011, secondary to pathologic left femoral neck fracture.  CURRENT THERAPY:  1. Arimidex 1 mg p.o. daily, started 03/18/2011.  2. Zometa 4 mg IV on a monthly basis.  CHEMOTHERAPY INTENT: Palliative  CURRENT # OF HORMONAL THERAPY CYCLES: 26  CURRENT ANTIEMETICS: None  CURRENT SMOKING STATUS: Nonsmoker  ORAL HORMONAL THERAPY AND CONSENT: Yes.  CURRENT BISPHOSPHONATES USE: Zometa with stable renal function.  PAIN MANAGEMENT: Tramadol when necessary  NARCOTICS INDUCED CONSTIPATION: None  LIVING WILL AND CODE STATUS: Full code.  INTERVAL HISTORY: [name] [name] 72 y.o. female returns to the clinic today for routine three-month followup visit. The patient is feeling fine today with no specific complaints except for pain at the right knee. She had x-ray of the right performed recently that showed no acute osseous abnormality identified. The patient is currently on tramadol for  pain management. She denied having any other significant complaints. She lost few pounds intentionally. The patient denied having any significant chest pain, shortness breath, cough or hemoptysis. She has no nausea or vomiting. She is tolerating her treatment with Arimidex fairly well.  MEDICAL HISTORY: Past Medical History  Diagnosis Date  . Asthma   . Hypertension   . Diabetes mellitus type II   . Cerebrovascular accident     R thalamic CVA 01/2010  . Hyperlipemia   . Dyslipidemia   . TIA (transient ischemic attack)     x2 in 2011  . Low back pain   . Osteoarthritis   . Anxiety   . Breast cancer     2002; metastatic in 2012    ALLERGIES:  is allergic to atorvastatin.  MEDICATIONS:  Current Outpatient Prescriptions  Medication Sig Dispense Refill  . albuterol (VENTOLIN HFA) 108 (90 BASE) MCG/ACT inhaler Inhale 2 puffs into the lungs 4 (four) times daily as needed for wheezing.  1 Inhaler  5  . amLODipine (NORVASC) 10 MG tablet Take 1 tablet (10 mg total) by mouth daily.  90 tablet  3  . anastrozole (ARIMIDEX) 1 MG tablet Take 1 tablet (1 mg total) by mouth daily.  90 tablet  2  . Cholecalciferol (VITAMIN D3) 1000 UNITS CAPS Take by mouth.        . Fluticasone-Salmeterol (ADVAIR DISKUS) 250-50 MCG/DOSE AEPB Inhale 1 puff into the lungs as needed.  60 each  5  . ibuprofen (ADVIL,MOTRIN) 200 MG tablet Take 2 tablets (400 mg total) by mouth every 6 (six) hours as needed for pain.  100 tablet  3  . losartan-hydrochlorothiazide (HYZAAR) 100-25 MG per tablet Take 1 tablet by mouth daily.  90 tablet  3  . pantoprazole (PROTONIX) 40 MG tablet Take 1 tablet (40 mg total) by mouth daily.  30 tablet  11  . traMADol (ULTRAM) 50 MG tablet Take 1-2 tablets (50-100 mg total) by mouth 2 (two) times daily as needed for pain.  100 tablet  1  . triamcinolone cream (KENALOG) 0.5 % Apply topically 3 (three) times daily.  60 g  3   No current facility-administered medications for this visit.     SURGICAL HISTORY:  Past Surgical History  Procedure Laterality Date  . Breast lumpectomy      Right breast 2002  . Joint replacement      L hip due to breast ca met    REVIEW OF SYSTEMS:  A comprehensive review of systems was negative except for: Constitutional: positive for fatigue Musculoskeletal: positive for Right knee pain   PHYSICAL EXAMINATION: General appearance: alert, cooperative and no distress Head: Normocephalic, without obvious abnormality, atraumatic Neck: no adenopathy Lymph nodes: Cervical, supraclavicular, and axillary nodes normal. Resp: clear to auscultation bilaterally Cardio: regular rate and rhythm, S1, S2 normal, no murmur, click, rub or gallop GI: soft, non-tender; bowel sounds normal; no masses,  no organomegaly Extremities: extremities normal, atraumatic, no cyanosis or edema  ECOG PERFORMANCE STATUS: 1 - Symptomatic but completely ambulatory  Blood pressure 145/80, pulse 81, temperature 97.4 F (36.3 C), temperature source Oral, resp. rate 20, height 5\' 4"  (1.626 m), weight 223 lb 9.6 oz (101.424 kg).  LABORATORY DATA: Lab Results  Component Value Date   WBC 6.8 05/28/2013   HGB 12.1 05/28/2013   HCT 37.2 05/28/2013   MCV 86.9 05/28/2013   PLT 254 05/28/2013      Chemistry      Component Value Date/Time   NA 142 04/25/2013 1023   NA 139 05/24/2012 1221   NA 140 05/03/2012 1449   K 3.8 04/25/2013 1023   K 4.0 05/24/2012 1221   K 4.3 05/03/2012 1449   CL 109* 04/25/2013 1023   CL 105 05/24/2012 1221   CL 99 05/03/2012 1449   CO2 25 04/25/2013 1023   CO2 25 05/24/2012 1221   CO2 27 05/03/2012 1449   BUN 9.7 04/25/2013 1023   BUN 12 05/24/2012 1221   BUN 14 05/03/2012 1449   CREATININE 1.2* 04/25/2013 1023   CREATININE 1.05 05/24/2012 1221   CREATININE 1.1 05/03/2012 1449      Component Value Date/Time   CALCIUM 9.5 04/25/2013 1023   CALCIUM 9.0 05/24/2012 1221   CALCIUM 9.1 05/03/2012 1449   ALKPHOS 98 02/23/2013 1225   ALKPHOS 78 05/03/2012 1449   ALKPHOS  85 02/02/2012 1534   AST 13 02/23/2013 1225   AST 19 05/03/2012 1449   AST 14 02/02/2012 1534   ALT 8 02/23/2013 1225   ALT 14 05/03/2012 1449   ALT 10 02/02/2012 1534   BILITOT 0.22 02/23/2013 1225   BILITOT 0.50 05/03/2012 1449   BILITOT 0.3 02/02/2012 1534       RADIOGRAPHIC STUDIES: No results found.  ASSESSMENT AND PLAN: This is a very pleasant 72 years old African American female with metastatic breast adenocarcinoma with bone metastasis. The patient is currently on hormonal therapy with Arimidex with no significant evidence for disease progression. Her tumor marker is still pending today. I recommended for the patient to continue her current treatment with Arimidex. She will also continue on Zometa 4 mg  IV on a monthly basis. The patient would come back for followup visit in 3 months with repeat CBC, comprehensive metabolic panel and CA [id number]. For the right knee pain, advice the patient to see her orthopedic surgeon for further evaluation. She was advised to call immediately if she has any concerning symptoms in the interval.  The patient voices understanding of current disease status and treatment options and is in agreement with the current care plan.  All questions were answered. The patient knows to call the clinic with any problems, questions or concerns. We can certainly see the patient much sooner if necessary.

## 2013-05-30 ENCOUNTER — Telehealth: Payer: Self-pay | Admitting: Internal Medicine

## 2013-05-30 NOTE — Telephone Encounter (Signed)
emailed appt calendar for September and october 2014

## 2013-06-25 ENCOUNTER — Other Ambulatory Visit (HOSPITAL_BASED_OUTPATIENT_CLINIC_OR_DEPARTMENT_OTHER): Payer: Medicare Other | Admitting: Lab

## 2013-06-25 ENCOUNTER — Ambulatory Visit (HOSPITAL_BASED_OUTPATIENT_CLINIC_OR_DEPARTMENT_OTHER): Payer: Medicare Other

## 2013-06-25 VITALS — BP 150/74 | HR 86 | Temp 97.3°F | Resp 24

## 2013-06-25 DIAGNOSIS — C50919 Malignant neoplasm of unspecified site of unspecified female breast: Secondary | ICD-10-CM

## 2013-06-25 DIAGNOSIS — C7951 Secondary malignant neoplasm of bone: Secondary | ICD-10-CM

## 2013-06-25 DIAGNOSIS — Z452 Encounter for adjustment and management of vascular access device: Secondary | ICD-10-CM

## 2013-06-25 LAB — BASIC METABOLIC PANEL (CC13)
BUN: 16.3 mg/dL (ref 7.0–26.0)
CO2: 23 mEq/L (ref 22–29)
Calcium: 9.2 mg/dL (ref 8.4–10.4)
Chloride: 106 mEq/L (ref 98–109)
Creatinine: 1.4 mg/dL — ABNORMAL HIGH (ref 0.6–1.1)
Glucose: 124 mg/dl (ref 70–140)

## 2013-06-25 MED ORDER — ALTEPLASE 2 MG IJ SOLR
2.0000 mg | Freq: Once | INTRAMUSCULAR | Status: AC | PRN
  Administered 2013-06-25: 2 mg
  Filled 2013-06-25: qty 2

## 2013-06-25 MED ORDER — SODIUM CHLORIDE 0.9 % IJ SOLN
10.0000 mL | INTRAMUSCULAR | Status: DC | PRN
  Administered 2013-06-25: 10 mL
  Filled 2013-06-25: qty 10

## 2013-06-25 MED ORDER — ZOLEDRONIC ACID 4 MG/5ML IV CONC
4.0000 mg | Freq: Once | INTRAVENOUS | Status: AC
  Administered 2013-06-25: 4 mg via INTRAVENOUS
  Filled 2013-06-25: qty 5

## 2013-06-25 MED ORDER — HEPARIN SOD (PORK) LOCK FLUSH 100 UNIT/ML IV SOLN
500.0000 [IU] | Freq: Once | INTRAVENOUS | Status: AC | PRN
  Administered 2013-06-25: 500 [IU]
  Filled 2013-06-25: qty 5

## 2013-06-25 NOTE — Patient Instructions (Addendum)
Zoledronic Acid injection (Hypercalcemia, Oncology) What is this medicine? ZOLEDRONIC ACID (ZOE le dron ik AS id) lowers the amount of calcium loss from bone. It is used to treat too much calcium in your blood from cancer. It is also used to prevent complications of cancer that has spread to the bone. This medicine may be used for other purposes; ask your health care provider or pharmacist if you have questions. What should I tell my health care provider before I take this medicine? They need to know if you have any of these conditions: -aspirin-sensitive asthma -dental disease -kidney disease -an unusual or allergic reaction to zoledronic acid, other medicines, foods, dyes, or preservatives -pregnant or trying to get pregnant -breast-feeding How should I use this medicine? This medicine is for infusion into a vein. It is given by a health care professional in a hospital or clinic setting. Talk to your pediatrician regarding the use of this medicine in children. Special care may be needed. Overdosage: If you think you have taken too much of this medicine contact a poison control center or emergency room at once. NOTE: This medicine is only for you. Do not share this medicine with others. What if I miss a dose? It is important not to miss your dose. Call your doctor or health care professional if you are unable to keep an appointment. What may interact with this medicine? -certain antibiotics given by injection -NSAIDs, medicines for pain and inflammation, like ibuprofen or naproxen -some diuretics like bumetanide, furosemide -teriparatide -thalidomide This list may not describe all possible interactions. Give your health care provider a list of all the medicines, herbs, non-prescription drugs, or dietary supplements you use. Also tell them if you smoke, drink alcohol, or use illegal drugs. Some items may interact with your medicine. What should I watch for while using this medicine? Visit  your doctor or health care professional for regular checkups. It may be some time before you see the benefit from this medicine. Do not stop taking your medicine unless your doctor tells you to. Your doctor may order blood tests or other tests to see how you are doing. Women should inform their doctor if they wish to become pregnant or think they might be pregnant. There is a potential for serious side effects to an unborn child. Talk to your health care professional or pharmacist for more information. You should make sure that you get enough calcium and vitamin D while you are taking this medicine. Discuss the foods you eat and the vitamins you take with your health care professional. Some people who take this medicine have severe bone, joint, and/or muscle pain. This medicine may also increase your risk for a broken thigh bone. Tell your doctor right away if you have pain in your upper leg or groin. Tell your doctor if you have any pain that does not go away or that gets worse. What side effects may I notice from receiving this medicine? Side effects that you should report to your doctor or health care professional as soon as possible: -allergic reactions like skin rash, itching or hives, swelling of the face, lips, or tongue -anxiety, confusion, or depression -breathing problems -changes in vision -feeling faint or lightheaded, falls -jaw burning, cramping, pain -muscle cramps, stiffness, or weakness -trouble passing urine or change in the amount of urine Side effects that usually do not require medical attention (report to your doctor or health care professional if they continue or are bothersome): -bone, joint, or muscle pain -  fever -hair loss -irritation at site where injected -loss of appetite -nausea, vomiting -stomach upset -tired This list may not describe all possible side effects. Call your doctor for medical advice about side effects. You may report side effects to FDA at  1-800-FDA-1088. Where should I keep my medicine? This drug is given in a hospital or clinic and will not be stored at home. NOTE: This sheet is a summary. It may not cover all possible information. If you have questions about this medicine, talk to your doctor, pharmacist, or health care provider.  2013, [employer]vier/Gold Standard. (04/16/2011 9:06:58 AM)

## 2013-07-06 ENCOUNTER — Encounter: Payer: Self-pay | Admitting: Internal Medicine

## 2013-07-06 ENCOUNTER — Ambulatory Visit (INDEPENDENT_AMBULATORY_CARE_PROVIDER_SITE_OTHER): Payer: Medicare Other | Admitting: Internal Medicine

## 2013-07-06 VITALS — BP 128/74 | HR 83 | Temp 98.4°F | Wt 222.0 lb

## 2013-07-06 DIAGNOSIS — I10 Essential (primary) hypertension: Secondary | ICD-10-CM | POA: Diagnosis not present

## 2013-07-06 DIAGNOSIS — R635 Abnormal weight gain: Secondary | ICD-10-CM | POA: Diagnosis not present

## 2013-07-06 DIAGNOSIS — J45909 Unspecified asthma, uncomplicated: Secondary | ICD-10-CM

## 2013-07-06 DIAGNOSIS — E876 Hypokalemia: Secondary | ICD-10-CM | POA: Diagnosis not present

## 2013-07-06 DIAGNOSIS — Z23 Encounter for immunization: Secondary | ICD-10-CM | POA: Diagnosis not present

## 2013-07-06 MED ORDER — POTASSIUM CHLORIDE CRYS ER 20 MEQ PO TBCR: 20.0000 meq | EXTENDED_RELEASE_TABLET | Freq: Every day | ORAL | Status: DC

## 2013-07-06 NOTE — Assessment & Plan Note (Signed)
BP Readings from Last 3 Encounters:  07/06/13 128/74  06/25/13 150/74  05/28/13 145/80  Continue with current prescription therapy as reflected on the Med list.

## 2013-07-06 NOTE — Assessment & Plan Note (Signed)
klor-con 1 a day - dissolve in water

## 2013-07-06 NOTE — Progress Notes (Signed)
Subjective:    HPI    The patient presents for a follow-up of  chronic hypertension, chronic dyslipidemia, type 2 diabetes controlled with medicines C/o R knee pain x chronic F/u breast cancer w/mets F/u GERD- not  using Prilosec - can't swallow it She had a flu shot in the past 2 years w/o complications  BP Readings from Last 3 Encounters:  07/06/13 128/74  06/25/13 150/74  05/28/13 145/80   Wt Readings from Last 3 Encounters:  07/06/13 222 lb (100.699 kg)  05/28/13 223 lb 9.6 oz (101.424 kg)  04/04/13 227 lb (102.967 kg)      Review of Systems  Constitutional: Negative for chills, activity change, appetite change, fatigue and unexpected weight change.  HENT: Negative for congestion, mouth sores and sinus pressure.   Eyes: Negative for visual disturbance.  Respiratory: Negative for cough and chest tightness.   Cardiovascular: Negative for palpitations.  Gastrointestinal: Negative for nausea.  Genitourinary: Negative for frequency, difficulty urinating and vaginal pain.  Musculoskeletal: Positive for arthralgias and gait problem. Negative for back pain.  Skin: Negative for pallor.  Neurological: Negative for dizziness, tremors, weakness and numbness.  Psychiatric/Behavioral: Negative for confusion and sleep disturbance.  no black ctools      Objective:   Physical Exam  Constitutional: She appears well-developed. No distress.  obese  HENT:  Head: Normocephalic.  Right Ear: External ear normal.  Left Ear: External ear normal.  Nose: Nose normal.  Mouth/Throat: Oropharynx is clear and moist.  Eyes: Conjunctivae are normal. Pupils are equal, round, and reactive to light. Right eye exhibits no discharge. Left eye exhibits no discharge.  Neck: Normal range of motion. Neck supple. No JVD present. No tracheal deviation present. No thyromegaly present.  Cardiovascular: Normal rate, regular rhythm and normal heart sounds.   Pulmonary/Chest: No stridor. No respiratory  distress. She has no wheezes.  Abdominal: Soft. Bowel sounds are normal. She exhibits no distension and no mass. There is no tenderness. There is no rebound and no guarding.  Musculoskeletal: She exhibits tenderness. She exhibits no edema.  R knee is tender  Lymphadenopathy:    She has no cervical adenopathy.  Neurological: She displays normal reflexes. No cranial nerve deficit. She exhibits normal muscle tone. Coordination normal.  cane  Skin: No rash noted. No erythema.  Psychiatric: She has a normal mood and affect. Her behavior is normal. Judgment and thought content normal.  No palor   Lab Results  Component Value Date   WBC 5.7 02/02/2012   HGB 12.2 02/02/2012   HCT 37.5 02/02/2012   PLT 239 02/02/2012   GLUCOSE 109* 02/02/2012   CHOL  Value: 187        ATP III CLASSIFICATION:  <200     mg/dL   Desirable  200-239  mg/dL   Borderline High  >=240    mg/dL   High        02/14/2010   TRIG 61 02/14/2010   HDL 52 02/14/2010   LDLCALC  Value: 123        Total Cholesterol/HDL:CHD Risk Coronary Heart Disease Risk Table                     Men   Women  1/2 Average Risk   3.4   3.3  Average Risk       5.0   4.4  2 X Average Risk   9.6   7.1  3 X Average Risk  23.4   11.0  Use the calculated Patient Ratio above and the CHD Risk Table to determine the patient's CHD Risk.        ATP III CLASSIFICATION (LDL):  <100     mg/dL   Optimal  100-129  mg/dL   Near or Above                    Optimal  130-159  mg/dL   Borderline  160-189  mg/dL   High  >190     mg/dL   Very High* 02/14/2010   ALT 10 02/02/2012   AST 14 02/02/2012   NA 137 02/02/2012   K 4.0 02/02/2012   CL 101 02/02/2012   CREATININE 1.16* 02/02/2012   BUN 17 02/02/2012   CO2 21 02/02/2012   TSH 1.916 **Test methodology is 3rd generation TSH** 02/15/2010   INR 1.10 04/05/2011   HGBA1C  Value: 6.4 (NOTE)                                                                       According to the ADA Clinical Practice Recommendations for 2011, when HbA1c is used as a  screening test:   >=6.5%   Diagnostic of Diabetes Mellitus           (if abnormal result  is confirmed)  5.7-6.4%   Increased risk of developing Diabetes Mellitus  References:Diagnosis and Classification of Diabetes Mellitus,Diabetes [id number] 1):S62-S69 and Standards of Medical Care in         Diabetes - 2011,Diabetes Care,2011,34  (Suppl 1):S11-S61.* 02/15/2010        Assessment & Plan:

## 2013-07-06 NOTE — Assessment & Plan Note (Signed)
Continue with current prescription therapy as reflected on the Med list.  

## 2013-07-06 NOTE — Assessment & Plan Note (Signed)
Wt Readings from Last 3 Encounters:  07/06/13 222 lb (100.699 kg)  05/28/13 223 lb 9.6 oz (101.424 kg)  04/04/13 227 lb (102.967 kg)

## 2013-07-23 ENCOUNTER — Other Ambulatory Visit: Payer: Medicare Other | Admitting: Lab

## 2013-07-23 ENCOUNTER — Ambulatory Visit: Payer: Medicare Other

## 2013-07-25 ENCOUNTER — Telehealth: Payer: Self-pay | Admitting: Internal Medicine

## 2013-07-25 ENCOUNTER — Telehealth: Payer: Self-pay | Admitting: *Deleted

## 2013-07-25 NOTE — Telephone Encounter (Signed)
Patietn called regarding her appt time/date this week. I have called and gave her the appt for Friday

## 2013-07-25 NOTE — Telephone Encounter (Signed)
Pt called today to r/s 9/22 lb/inf. Per pt she thought her appts were tomorrow. R/s 9/22 appts to 9/26 @ 1:30pm and 10/20 appts for lb/fu to 10/23 for lb and 10/27 for f/u and inf. Pt is aware of each d/t and will get new schedule 9/26.

## 2013-07-27 ENCOUNTER — Ambulatory Visit (HOSPITAL_BASED_OUTPATIENT_CLINIC_OR_DEPARTMENT_OTHER): Payer: Medicare Other

## 2013-07-27 ENCOUNTER — Other Ambulatory Visit (HOSPITAL_BASED_OUTPATIENT_CLINIC_OR_DEPARTMENT_OTHER): Payer: Medicare Other | Admitting: Lab

## 2013-07-27 VITALS — BP 129/70 | HR 77 | Temp 97.4°F | Resp 20

## 2013-07-27 DIAGNOSIS — C50919 Malignant neoplasm of unspecified site of unspecified female breast: Secondary | ICD-10-CM

## 2013-07-27 DIAGNOSIS — C7951 Secondary malignant neoplasm of bone: Secondary | ICD-10-CM

## 2013-07-27 LAB — BASIC METABOLIC PANEL (CC13)
BUN: 12.6 mg/dL (ref 7.0–26.0)
Calcium: 9.4 mg/dL (ref 8.4–10.4)
Chloride: 107 mEq/L (ref 98–109)
Creatinine: 1.4 mg/dL — ABNORMAL HIGH (ref 0.6–1.1)

## 2013-07-27 MED ORDER — HEPARIN SOD (PORK) LOCK FLUSH 100 UNIT/ML IV SOLN
500.0000 [IU] | Freq: Once | INTRAVENOUS | Status: AC | PRN
  Administered 2013-07-27: 500 [IU]
  Filled 2013-07-27: qty 5

## 2013-07-27 MED ORDER — ZOLEDRONIC ACID 4 MG/100ML IV SOLN
4.0000 mg | Freq: Once | INTRAVENOUS | Status: AC
  Administered 2013-07-27: 4 mg via INTRAVENOUS
  Filled 2013-07-27: qty 100

## 2013-07-27 MED ORDER — SODIUM CHLORIDE 0.9 % IJ SOLN
10.0000 mL | INTRAMUSCULAR | Status: DC | PRN
  Administered 2013-07-27: 10 mL
  Filled 2013-07-27: qty 10

## 2013-07-27 MED ORDER — SODIUM CHLORIDE 0.9 % IV SOLN
Freq: Once | INTRAVENOUS | Status: AC
  Administered 2013-07-27: 14:00:00 via INTRAVENOUS

## 2013-07-27 NOTE — Patient Instructions (Addendum)
Zoledronic Acid injection (Hypercalcemia, Oncology) What is this medicine? ZOLEDRONIC ACID (ZOE le dron ik AS id) lowers the amount of calcium loss from bone. It is used to treat too much calcium in your blood from cancer. It is also used to prevent complications of cancer that has spread to the bone. This medicine may be used for other purposes; ask your health care provider or pharmacist if you have questions. What should I tell my health care provider before I take this medicine? They need to know if you have any of these conditions: -aspirin-sensitive asthma -dental disease -kidney disease -an unusual or allergic reaction to zoledronic acid, other medicines, foods, dyes, or preservatives -pregnant or trying to get pregnant -breast-feeding How should I use this medicine? This medicine is for infusion into a vein. It is given by a health care professional in a hospital or clinic setting. Talk to your pediatrician regarding the use of this medicine in children. Special care may be needed. Overdosage: If you think you have taken too much of this medicine contact a poison control center or emergency room at once. NOTE: This medicine is only for you. Do not share this medicine with others. What if I miss a dose? It is important not to miss your dose. Call your doctor or health care professional if you are unable to keep an appointment. What may interact with this medicine? -certain antibiotics given by injection -NSAIDs, medicines for pain and inflammation, like ibuprofen or naproxen -some diuretics like bumetanide, furosemide -teriparatide -thalidomide This list may not describe all possible interactions. Give your health care provider a list of all the medicines, herbs, non-prescription drugs, or dietary supplements you use. Also tell them if you smoke, drink alcohol, or use illegal drugs. Some items may interact with your medicine. What should I watch for while using this medicine? Visit  your doctor or health care professional for regular checkups. It may be some time before you see the benefit from this medicine. Do not stop taking your medicine unless your doctor tells you to. Your doctor may order blood tests or other tests to see how you are doing. Women should inform their doctor if they wish to become pregnant or think they might be pregnant. There is a potential for serious side effects to an unborn child. Talk to your health care professional or pharmacist for more information. You should make sure that you get enough calcium and vitamin D while you are taking this medicine. Discuss the foods you eat and the vitamins you take with your health care professional. Some people who take this medicine have severe bone, joint, and/or muscle pain. This medicine may also increase your risk for a broken thigh bone. Tell your doctor right away if you have pain in your upper leg or groin. Tell your doctor if you have any pain that does not go away or that gets worse. What side effects may I notice from receiving this medicine? Side effects that you should report to your doctor or health care professional as soon as possible: -allergic reactions like skin rash, itching or hives, swelling of the face, lips, or tongue -anxiety, confusion, or depression -breathing problems -changes in vision -feeling faint or lightheaded, falls -jaw burning, cramping, pain -muscle cramps, stiffness, or weakness -trouble passing urine or change in the amount of urine Side effects that usually do not require medical attention (report to your doctor or health care professional if they continue or are bothersome): -bone, joint, or muscle pain -  fever -hair loss -irritation at site where injected -loss of appetite -nausea, vomiting -stomach upset -tired This list may not describe all possible side effects. Call your doctor for medical advice about side effects. You may report side effects to FDA at  1-800-FDA-1088. Where should I keep my medicine? This drug is given in a hospital or clinic and will not be stored at home. NOTE: This sheet is a summary. It may not cover all possible information. If you have questions about this medicine, talk to your doctor, pharmacist, or health care provider.  2013, [employer]vier/Gold Standard. (04/16/2011 9:06:58 AM)

## 2013-08-20 ENCOUNTER — Ambulatory Visit: Payer: Medicare Other | Admitting: Internal Medicine

## 2013-08-20 ENCOUNTER — Other Ambulatory Visit: Payer: Medicare Other | Admitting: Lab

## 2013-08-23 ENCOUNTER — Other Ambulatory Visit (HOSPITAL_BASED_OUTPATIENT_CLINIC_OR_DEPARTMENT_OTHER): Payer: Medicare Other | Admitting: Lab

## 2013-08-23 ENCOUNTER — Ambulatory Visit (HOSPITAL_BASED_OUTPATIENT_CLINIC_OR_DEPARTMENT_OTHER): Payer: Medicare Other

## 2013-08-23 ENCOUNTER — Other Ambulatory Visit: Payer: Self-pay | Admitting: Internal Medicine

## 2013-08-23 VITALS — BP 159/93 | HR 77 | Temp 97.4°F | Resp 20

## 2013-08-23 DIAGNOSIS — C7951 Secondary malignant neoplasm of bone: Secondary | ICD-10-CM

## 2013-08-23 DIAGNOSIS — Z452 Encounter for adjustment and management of vascular access device: Secondary | ICD-10-CM | POA: Diagnosis not present

## 2013-08-23 DIAGNOSIS — C50919 Malignant neoplasm of unspecified site of unspecified female breast: Secondary | ICD-10-CM

## 2013-08-23 DIAGNOSIS — C50911 Malignant neoplasm of unspecified site of right female breast: Secondary | ICD-10-CM

## 2013-08-23 LAB — CBC WITH DIFFERENTIAL/PLATELET
Eosinophils Absolute: 0.1 10*3/uL (ref 0.0–0.5)
HGB: 12 g/dL (ref 11.6–15.9)
MCV: 88.1 fL (ref 79.5–101.0)
MONO%: 9.7 % (ref 0.0–14.0)
NEUT#: 4 10*3/uL (ref 1.5–6.5)
RBC: 4.2 10*6/uL (ref 3.70–5.45)
RDW: 15.6 % — ABNORMAL HIGH (ref 11.2–14.5)
WBC: 5.7 10*3/uL (ref 3.9–10.3)
lymph#: 1 10*3/uL (ref 0.9–3.3)

## 2013-08-23 LAB — COMPREHENSIVE METABOLIC PANEL (CC13)
AST: 11 U/L (ref 5–34)
Albumin: 3.3 g/dL — ABNORMAL LOW (ref 3.5–5.0)
Anion Gap: 9 mEq/L (ref 3–11)
BUN: 11.8 mg/dL (ref 7.0–26.0)
CO2: 22 mEq/L (ref 22–29)
Calcium: 9.4 mg/dL (ref 8.4–10.4)
Chloride: 110 mEq/L — ABNORMAL HIGH (ref 98–109)
Glucose: 104 mg/dl (ref 70–140)
Potassium: 3.6 mEq/L (ref 3.5–5.1)

## 2013-08-23 MED ORDER — SODIUM CHLORIDE 0.9 % IJ SOLN
10.0000 mL | INTRAMUSCULAR | Status: DC | PRN
  Administered 2013-08-23: 10 mL via INTRAVENOUS
  Filled 2013-08-23: qty 10

## 2013-08-23 MED ORDER — HEPARIN SOD (PORK) LOCK FLUSH 100 UNIT/ML IV SOLN
500.0000 [IU] | Freq: Once | INTRAVENOUS | Status: AC
  Administered 2013-08-23: 500 [IU] via INTRAVENOUS
  Filled 2013-08-23: qty 5

## 2013-08-24 LAB — CANCER ANTIGEN 27.29: CA 27.29: 47 U/mL — ABNORMAL HIGH (ref 0–39)

## 2013-08-27 ENCOUNTER — Telehealth: Payer: Self-pay | Admitting: Internal Medicine

## 2013-08-27 ENCOUNTER — Telehealth: Payer: Self-pay | Admitting: *Deleted

## 2013-08-27 ENCOUNTER — Ambulatory Visit (HOSPITAL_BASED_OUTPATIENT_CLINIC_OR_DEPARTMENT_OTHER): Payer: Medicare Other

## 2013-08-27 ENCOUNTER — Encounter (INDEPENDENT_AMBULATORY_CARE_PROVIDER_SITE_OTHER): Payer: Self-pay

## 2013-08-27 ENCOUNTER — Ambulatory Visit (HOSPITAL_BASED_OUTPATIENT_CLINIC_OR_DEPARTMENT_OTHER): Payer: Medicare Other | Admitting: Internal Medicine

## 2013-08-27 ENCOUNTER — Other Ambulatory Visit: Payer: Medicare Other | Admitting: Lab

## 2013-08-27 ENCOUNTER — Encounter: Payer: Self-pay | Admitting: Internal Medicine

## 2013-08-27 VITALS — BP 150/53 | HR 68 | Temp 97.9°F | Resp 18 | Ht 64.0 in | Wt 224.5 lb

## 2013-08-27 DIAGNOSIS — Z170 Estrogen receptor positive status [ER+]: Secondary | ICD-10-CM | POA: Diagnosis not present

## 2013-08-27 DIAGNOSIS — C7951 Secondary malignant neoplasm of bone: Secondary | ICD-10-CM

## 2013-08-27 DIAGNOSIS — C50219 Malignant neoplasm of upper-inner quadrant of unspecified female breast: Secondary | ICD-10-CM

## 2013-08-27 DIAGNOSIS — C773 Secondary and unspecified malignant neoplasm of axilla and upper limb lymph nodes: Secondary | ICD-10-CM | POA: Diagnosis not present

## 2013-08-27 DIAGNOSIS — C50911 Malignant neoplasm of unspecified site of right female breast: Secondary | ICD-10-CM

## 2013-08-27 MED ORDER — SODIUM CHLORIDE 0.9 % IV SOLN
Freq: Once | INTRAVENOUS | Status: AC
  Administered 2013-08-27: 11:00:00 via INTRAVENOUS

## 2013-08-27 MED ORDER — ZOLEDRONIC ACID 4 MG/5ML IV CONC: 4.0000 mg | Freq: Once | INTRAVENOUS | Status: DC

## 2013-08-27 MED ORDER — HEPARIN SOD (PORK) LOCK FLUSH 100 UNIT/ML IV SOLN
500.0000 [IU] | Freq: Once | INTRAVENOUS | Status: AC | PRN
  Administered 2013-08-27: 500 [IU]
  Filled 2013-08-27: qty 5

## 2013-08-27 MED ORDER — ZOLEDRONIC ACID 4 MG/100ML IV SOLN
4.0000 mg | Freq: Once | INTRAVENOUS | Status: AC
  Administered 2013-08-27: 4 mg via INTRAVENOUS
  Filled 2013-08-27: qty 100

## 2013-08-27 MED ORDER — SODIUM CHLORIDE 0.9 % IJ SOLN
10.0000 mL | INTRAMUSCULAR | Status: DC | PRN
  Administered 2013-08-27: 10 mL
  Filled 2013-08-27: qty 10

## 2013-08-27 NOTE — Progress Notes (Signed)
[place] [phone]   [phone]  OFFICE PROGRESS NOTE  Walker Kehr, MD [address] [facility] [place] [address]  DIAGNOSIS AND STAGE: Metastatic breast adenocarcinoma, presenting with bone metastasis in the left hip area. This was initially diagnosed as stage II (T2, N1, M0) invasive right breast carcinoma with positive estrogen and progesterone receptors and HER-2/neu positive in July 2002   PRIOR THERAPY:  1. Status post right lumpectomy with right axillary lymph node dissection revealing 4/9 lymph nodes that were positive for malignancy.  2. Status post 8 cycles of systemic chemotherapy with CMF, completed March 2003.  3. Status post radiotherapy to the remaining right breast under the care of Dr. Tammi Klippel.  4. Status post 2 years of treatment with tamoxifen, started July 2003, then switched to Femara by Dr. Humphrey Rolls in August 2005. The patient discontinued the treatment in February 2009.  5. Status post left total hip replacement on Apr 01, 2011, secondary to pathologic left femoral neck fracture.  CURRENT THERAPY:  1. Arimidex 1 mg p.o. daily, started 03/18/2011.  2. Zometa 4 mg IV on 3 monthly basis.  CHEMOTHERAPY INTENT: Palliative  CURRENT # OF HORMONAL THERAPY CYCLES: 29  CURRENT ANTIEMETICS: None  CURRENT SMOKING STATUS: Nonsmoker  ORAL HORMONAL THERAPY AND CONSENT: Yes.  CURRENT BISPHOSPHONATES USE: Zometa with stable renal function.  PAIN MANAGEMENT: Tramadol when necessary  NARCOTICS INDUCED CONSTIPATION: None  LIVING WILL AND CODE STATUS: Full code.   INTERVAL HISTORY:  [name] [name] 72 y.o. female returns to the clinic today for routine three-month followup visit. The patient is feeling fine today with no specific complaints except for pain at the right knee and generalized fatigue. She did not see Dr. Alvan Dame as she was advised.  The patient is currently on tramadol for pain management. She denied having  any other significant complaints. She has no significant weight loss. The patient denied having any significant chest pain, shortness breath, cough or hemoptysis. She has mild nausea with acid reflux. She is tolerating her treatment with Arimidex fairly well.  MEDICAL HISTORY: Past Medical History  Diagnosis Date  . Asthma   . Hypertension   . Diabetes mellitus type II   . Cerebrovascular accident     R thalamic CVA 01/2010  . Hyperlipemia   . Dyslipidemia   . TIA (transient ischemic attack)     x2 in 2011  . Low back pain   . Osteoarthritis   . Anxiety   . Breast cancer     2002; metastatic in 2012    ALLERGIES:  is allergic to atorvastatin.  MEDICATIONS:  Current Outpatient Prescriptions  Medication Sig Dispense Refill  . albuterol (VENTOLIN HFA) 108 (90 BASE) MCG/ACT inhaler Inhale 2 puffs into the lungs 4 (four) times daily as needed for wheezing.  1 Inhaler  5  . amLODipine (NORVASC) 10 MG tablet TAKE 1 TABLET BY MOUTH EVERY DAY  90 tablet  2  . anastrozole (ARIMIDEX) 1 MG tablet Take 1 tablet (1 mg total) by mouth daily.  90 tablet  2  . Cholecalciferol (VITAMIN D3) 1000 UNITS CAPS Take by mouth.        . Fluticasone-Salmeterol (ADVAIR DISKUS) 250-50 MCG/DOSE AEPB Inhale 1 puff into the lungs as needed.  60 each  5  . ibuprofen (ADVIL,MOTRIN) 200 MG tablet Take 2 tablets (400 mg total) by mouth every 6 (six) hours as needed for pain.  100 tablet  3  . losartan-hydrochlorothiazide (HYZAAR) 100-25 MG per tablet Take 1 tablet by mouth daily.  90 tablet  3  . pantoprazole (PROTONIX) 40 MG tablet Take 1 tablet (40 mg total) by mouth daily.  30 tablet  11  . potassium chloride SA (K-DUR,KLOR-CON) 20 MEQ tablet Take 1 tablet (20 mEq total) by mouth daily. Dissolve in water  30 tablet  3  . traMADol (ULTRAM) 50 MG tablet Take 1-2 tablets (50-100 mg total) by mouth 2 (two) times daily as needed for pain.  100 tablet  1  . triamcinolone cream (KENALOG) 0.5 % Apply topically 3 (three)  times daily.  60 g  3   No current facility-administered medications for this visit.    SURGICAL HISTORY:  Past Surgical History  Procedure Laterality Date  . Breast lumpectomy      Right breast 2002  . Joint replacement      L hip due to breast ca met    REVIEW OF SYSTEMS:  A comprehensive review of systems was negative except for: Constitutional: positive for fatigue Gastrointestinal: positive for nausea Musculoskeletal: positive for arthralgias and bone pain   PHYSICAL EXAMINATION: General appearance: alert, cooperative, fatigued and no distress Head: Normocephalic, without obvious abnormality, atraumatic Neck: no adenopathy, no JVD, supple, symmetrical, trachea midline and thyroid not enlarged, symmetric, no tenderness/mass/nodules Lymph nodes: Cervical, supraclavicular, and axillary nodes normal. Resp: clear to auscultation bilaterally Back: symmetric, no curvature. ROM normal. No CVA tenderness. Cardio: regular rate and rhythm, S1, S2 normal, no murmur, click, rub or gallop GI: soft, non-tender; bowel sounds normal; no masses,  no organomegaly Extremities: extremities normal, atraumatic, no cyanosis or edema  ECOG PERFORMANCE STATUS: 1 - Symptomatic but completely ambulatory  Blood pressure 150/53, pulse 68, temperature 97.9 F (36.6 C), temperature source Oral, resp. rate 18, height 5\' 4"  (1.626 m), weight 224 lb 8 oz (101.833 kg).  LABORATORY DATA: Lab Results  Component Value Date   WBC 5.7 08/23/2013   HGB 12.0 08/23/2013   HCT 37.0 08/23/2013   MCV 88.1 08/23/2013   PLT 276 08/23/2013      Chemistry      Component Value Date/Time   NA 140 08/23/2013 0951   NA 139 05/24/2012 1221   NA 140 05/03/2012 1449   K 3.6 08/23/2013 0951   K 4.0 05/24/2012 1221   K 4.3 05/03/2012 1449   CL 109* 04/25/2013 1023   CL 105 05/24/2012 1221   CL 99 05/03/2012 1449   CO2 22 08/23/2013 0951   CO2 25 05/24/2012 1221   CO2 27 05/03/2012 1449   BUN 11.8 08/23/2013 0951   BUN 12  05/24/2012 1221   BUN 14 05/03/2012 1449   CREATININE 1.3* 08/23/2013 0951   CREATININE 1.05 05/24/2012 1221   CREATININE 1.1 05/03/2012 1449      Component Value Date/Time   CALCIUM 9.4 08/23/2013 0951   CALCIUM 9.0 05/24/2012 1221   CALCIUM 9.1 05/03/2012 1449   ALKPHOS 103 08/23/2013 0951   ALKPHOS 78 05/03/2012 1449   ALKPHOS 85 02/02/2012 1534   AST 11 08/23/2013 0951   AST 19 05/03/2012 1449   AST 14 02/02/2012 1534   ALT <6 08/23/2013 0951   ALT 14 05/03/2012 1449   ALT 10 02/02/2012 1534   BILITOT 0.42 08/23/2013 0951   BILITOT 0.50 05/03/2012 1449   BILITOT 0.3 02/02/2012 1534       RADIOGRAPHIC STUDIES: No results found.  ASSESSMENT AND PLAN: This is a very pleasant 72 years  old [place] American female with metastatic breast adenocarcinoma with bone metastasis. The patient is currently on hormonal therapy with Arimidex with no significant evidence for disease progression.  I recommended for the patient to continue her current treatment with Arimidex. She will also continue on Zometa 4 mg IV every 3 months. She was also advised to take calcium and vitamin D supplement. The patient would come back for followup visit in 3 months with repeat CBC, comprehensive metabolic panel and CA [id number].  For the right knee pain,  I advised the patient to see Dr. Alvan Dame, her orthopedic surgeon for further evaluation.  I also strongly encouraged the patient to lose weight. She was advised to call immediately if she has any concerning symptoms in the interval. The patient voices understanding of current disease status and treatment options and is in agreement with the current care plan.  All questions were answered. The patient knows to call the clinic with any problems, questions or concerns. We can certainly see the patient much sooner if necessary.

## 2013-08-27 NOTE — Telephone Encounter (Signed)
Called pt for appt for lab and Md and chemo for January 2015

## 2013-08-27 NOTE — Telephone Encounter (Signed)
Gave pt appt for lab and Md, eamiled Sharyn Lull regarding chemo for January 2015

## 2013-08-27 NOTE — Patient Instructions (Signed)
Zoledronic Acid injection (Oncology) What is this medicine? ZOLEDRONIC ACID (ZOE le dron ik AS id) lowers the amount of calcium loss from bone. It is used to treat too much calcium in your blood from cancer. It is also used to prevent complications of cancer that has spread to the bone. This medicine may be used for other purposes; ask your health care provider or pharmacist if you have questions. What should I tell my health care provider before I take this medicine? They need to know if you have any of these conditions: -aspirin-sensitive asthma -dental disease -kidney disease -an unusual or allergic reaction to zoledronic acid, other medicines, foods, dyes, or preservatives -pregnant or trying to get pregnant -breast-feeding How should I use this medicine? This medicine is for infusion into a vein. It is given by a health care professional in a hospital or clinic setting. Talk to your pediatrician regarding the use of this medicine in children. Special care may be needed. Overdosage: If you think you have taken too much of this medicine contact a poison control center or emergency room at once. NOTE: This medicine is only for you. Do not share this medicine with others. What if I miss a dose? It is important not to miss your dose. Call your doctor or health care professional if you are unable to keep an appointment. What may interact with this medicine? -certain antibiotics given by injection -NSAIDs, medicines for pain and inflammation, like ibuprofen or naproxen -some diuretics like bumetanide, furosemide -teriparatide -thalidomide This list may not describe all possible interactions. Give your health care provider a list of all the medicines, herbs, non-prescription drugs, or dietary supplements you use. Also tell them if you smoke, drink alcohol, or use illegal drugs. Some items may interact with your medicine. What should I watch for while using this medicine? Visit your doctor or  health care professional for regular checkups. It may be some time before you see the benefit from this medicine. Do not stop taking your medicine unless your doctor tells you to. Your doctor may order blood tests or other tests to see how you are doing. Women should inform their doctor if they wish to become pregnant or think they might be pregnant. There is a potential for serious side effects to an unborn child. Talk to your health care professional or pharmacist for more information. You should make sure that you get enough calcium and vitamin D while you are taking this medicine. Discuss the foods you eat and the vitamins you take with your health care professional. Some people who take this medicine have severe bone, joint, and/or muscle pain. This medicine may also increase your risk for a broken thigh bone. Tell your doctor right away if you have pain in your upper leg or groin. Tell your doctor if you have any pain that does not go away or that gets worse. What side effects may I notice from receiving this medicine? Side effects that you should report to your doctor or health care professional as soon as possible: -allergic reactions like skin rash, itching or hives, swelling of the face, lips, or tongue -anxiety, confusion, or depression -breathing problems -changes in vision -feeling faint or lightheaded, falls -jaw burning, cramping, pain -muscle cramps, stiffness, or weakness -trouble passing urine or change in the amount of urine Side effects that usually do not require medical attention (report to your doctor or health care professional if they continue or are bothersome): -bone, joint, or muscle pain -fever -  hair loss -irritation at site where injected -loss of appetite -nausea, vomiting -stomach upset -tired This list may not describe all possible side effects. Call your doctor for medical advice about side effects. You may report side effects to FDA at 1-800-FDA-1088. Where  should I keep my medicine? This drug is given in a hospital or clinic and will not be stored at home. NOTE: This sheet is a summary. It may not cover all possible information. If you have questions about this medicine, talk to your doctor, pharmacist, or health care provider.  2013, Elsevier/Gold Standard. (04/16/2011 9:06:58 AM)

## 2013-08-27 NOTE — Telephone Encounter (Signed)
Per staff message and POF I have scheduled appts.  JMW  

## 2013-08-28 ENCOUNTER — Other Ambulatory Visit: Payer: Medicare Other | Admitting: Lab

## 2013-08-28 ENCOUNTER — Ambulatory Visit: Payer: Medicare Other | Admitting: Internal Medicine

## 2013-09-10 DIAGNOSIS — M1710 Unilateral primary osteoarthritis, unspecified knee: Secondary | ICD-10-CM | POA: Diagnosis not present

## 2013-10-09 ENCOUNTER — Ambulatory Visit (INDEPENDENT_AMBULATORY_CARE_PROVIDER_SITE_OTHER): Payer: Medicare Other | Admitting: Internal Medicine

## 2013-10-09 ENCOUNTER — Encounter: Payer: Self-pay | Admitting: Internal Medicine

## 2013-10-09 VITALS — BP 160/84 | HR 80 | Temp 97.7°F | Resp 16 | Wt 220.0 lb

## 2013-10-09 DIAGNOSIS — C7951 Secondary malignant neoplasm of bone: Secondary | ICD-10-CM

## 2013-10-09 DIAGNOSIS — C50919 Malignant neoplasm of unspecified site of unspecified female breast: Secondary | ICD-10-CM | POA: Diagnosis not present

## 2013-10-09 DIAGNOSIS — J45909 Unspecified asthma, uncomplicated: Secondary | ICD-10-CM | POA: Diagnosis not present

## 2013-10-09 DIAGNOSIS — I10 Essential (primary) hypertension: Secondary | ICD-10-CM | POA: Diagnosis not present

## 2013-10-09 DIAGNOSIS — E785 Hyperlipidemia, unspecified: Secondary | ICD-10-CM

## 2013-10-09 DIAGNOSIS — E876 Hypokalemia: Secondary | ICD-10-CM

## 2013-10-09 DIAGNOSIS — M1990 Unspecified osteoarthritis, unspecified site: Secondary | ICD-10-CM

## 2013-10-09 NOTE — Progress Notes (Signed)
Subjective:    HPI    The patient presents for a follow-up of  chronic hypertension, chronic dyslipidemia, type 2 diabetes controlled with medicines C/o R knee pain x chronic F/u breast cancer w/mets F/u GERD- not  using Prilosec - can't swallow it She had a flu shot in the past 2 years w/o complications  BP Readings from Last 3 Encounters:  10/09/13 160/84  08/27/13 150/53  08/23/13 159/93   Wt Readings from Last 3 Encounters:  10/09/13 220 lb (99.791 kg)  08/27/13 224 lb 8 oz (101.833 kg)  07/06/13 222 lb (100.699 kg)      Review of Systems  Constitutional: Negative for chills, activity change, appetite change, fatigue and unexpected weight change.  HENT: Negative for congestion, mouth sores and sinus pressure.   Eyes: Negative for visual disturbance.  Respiratory: Negative for cough and chest tightness.   Cardiovascular: Negative for palpitations.  Gastrointestinal: Negative for nausea.  Genitourinary: Negative for frequency, difficulty urinating and vaginal pain.  Musculoskeletal: Positive for arthralgias and gait problem. Negative for back pain.  Skin: Negative for pallor.  Neurological: Negative for dizziness, tremors, weakness and numbness.  Psychiatric/Behavioral: Negative for confusion and sleep disturbance.  no black stools      Objective:   Physical Exam  Constitutional: She appears well-developed. No distress.  obese  HENT:  Head: Normocephalic.  Right Ear: External ear normal.  Left Ear: External ear normal.  Nose: Nose normal.  Mouth/Throat: Oropharynx is clear and moist.  Eyes: Conjunctivae are normal. Pupils are equal, round, and reactive to light. Right eye exhibits no discharge. Left eye exhibits no discharge.  Neck: Normal range of motion. Neck supple. No JVD present. No tracheal deviation present. No thyromegaly present.  Cardiovascular: Normal rate, regular rhythm and normal heart sounds.   Pulmonary/Chest: No stridor. No respiratory  distress. She has no wheezes.  Abdominal: Soft. Bowel sounds are normal. She exhibits no distension and no mass. There is no tenderness. There is no rebound and no guarding.  Musculoskeletal: She exhibits tenderness. She exhibits no edema.  R knee is tender  Lymphadenopathy:    She has no cervical adenopathy.  Neurological: She displays normal reflexes. No cranial nerve deficit. She exhibits normal muscle tone. Coordination normal.  cane  Skin: No rash noted. No erythema.  Psychiatric: She has a normal mood and affect. Her behavior is normal. Judgment and thought content normal.  No palor   Lab Results  Component Value Date   WBC 5.7 02/02/2012   HGB 12.2 02/02/2012   HCT 37.5 02/02/2012   PLT 239 02/02/2012   GLUCOSE 109* 02/02/2012   CHOL  Value: 187        ATP III CLASSIFICATION:  <200     mg/dL   Desirable  200-239  mg/dL   Borderline High  >=240    mg/dL   High        02/14/2010   TRIG 61 02/14/2010   HDL 52 02/14/2010   LDLCALC  Value: 123        Total Cholesterol/HDL:CHD Risk Coronary Heart Disease Risk Table                     Men   Women  1/2 Average Risk   3.4   3.3  Average Risk       5.0   4.4  2 X Average Risk   9.6   7.1  3 X Average Risk  23.4   11.0  Use the calculated Patient Ratio above and the CHD Risk Table to determine the patient's CHD Risk.        ATP III CLASSIFICATION (LDL):  <100     mg/dL   Optimal  100-129  mg/dL   Near or Above                    Optimal  130-159  mg/dL   Borderline  160-189  mg/dL   High  >190     mg/dL   Very High* 02/14/2010   ALT 10 02/02/2012   AST 14 02/02/2012   NA 137 02/02/2012   K 4.0 02/02/2012   CL 101 02/02/2012   CREATININE 1.16* 02/02/2012   BUN 17 02/02/2012   CO2 21 02/02/2012   TSH 1.916 **Test methodology is 3rd generation TSH** 02/15/2010   INR 1.10 04/05/2011   HGBA1C  Value: 6.4 (NOTE)                                                                       According to the ADA Clinical Practice Recommendations for 2011, when HbA1c is used as a  screening test:   >=6.5%   Diagnostic of Diabetes Mellitus           (if abnormal result  is confirmed)  5.7-6.4%   Increased risk of developing Diabetes Mellitus  References:Diagnosis and Classification of Diabetes Mellitus,Diabetes [id number] 1):S62-S69 and Standards of Medical Care in         Diabetes - 2011,Diabetes Care,2011,34  (Suppl 1):S11-S61.* 02/15/2010        Assessment & Plan:

## 2013-10-09 NOTE — Assessment & Plan Note (Signed)
Per oncology °

## 2013-10-09 NOTE — Assessment & Plan Note (Signed)
See meds 

## 2013-10-09 NOTE — Assessment & Plan Note (Signed)
Continue with current prescription therapy as reflected on the Med list.  

## 2013-10-09 NOTE — Assessment & Plan Note (Signed)
Doing well 

## 2013-10-09 NOTE — Progress Notes (Signed)
Pre visit review using our clinic review tool, if applicable. No additional management support is needed unless otherwise documented below in the visit note. 

## 2013-10-31 DIAGNOSIS — M25569 Pain in unspecified knee: Secondary | ICD-10-CM | POA: Diagnosis not present

## 2013-11-09 DIAGNOSIS — M25569 Pain in unspecified knee: Secondary | ICD-10-CM | POA: Diagnosis not present

## 2013-11-12 DIAGNOSIS — M25569 Pain in unspecified knee: Secondary | ICD-10-CM | POA: Diagnosis not present

## 2013-11-15 DIAGNOSIS — M25569 Pain in unspecified knee: Secondary | ICD-10-CM | POA: Diagnosis not present

## 2013-11-19 DIAGNOSIS — M25569 Pain in unspecified knee: Secondary | ICD-10-CM | POA: Diagnosis not present

## 2013-11-22 DIAGNOSIS — M25569 Pain in unspecified knee: Secondary | ICD-10-CM | POA: Diagnosis not present

## 2013-11-27 ENCOUNTER — Other Ambulatory Visit (HOSPITAL_BASED_OUTPATIENT_CLINIC_OR_DEPARTMENT_OTHER): Payer: Medicare Other

## 2013-11-27 ENCOUNTER — Telehealth: Payer: Self-pay | Admitting: Internal Medicine

## 2013-11-27 ENCOUNTER — Ambulatory Visit (HOSPITAL_BASED_OUTPATIENT_CLINIC_OR_DEPARTMENT_OTHER): Payer: Medicare Other | Admitting: Internal Medicine

## 2013-11-27 ENCOUNTER — Telehealth: Payer: Self-pay | Admitting: *Deleted

## 2013-11-27 ENCOUNTER — Ambulatory Visit (HOSPITAL_BASED_OUTPATIENT_CLINIC_OR_DEPARTMENT_OTHER): Payer: Medicare Other

## 2013-11-27 ENCOUNTER — Encounter: Payer: Self-pay | Admitting: Internal Medicine

## 2013-11-27 VITALS — BP 162/79 | HR 75 | Temp 97.3°F | Resp 20 | Ht 64.0 in | Wt 224.2 lb

## 2013-11-27 DIAGNOSIS — C7951 Secondary malignant neoplasm of bone: Secondary | ICD-10-CM

## 2013-11-27 DIAGNOSIS — C7952 Secondary malignant neoplasm of bone marrow: Secondary | ICD-10-CM

## 2013-11-27 DIAGNOSIS — R110 Nausea: Secondary | ICD-10-CM

## 2013-11-27 DIAGNOSIS — M25569 Pain in unspecified knee: Secondary | ICD-10-CM | POA: Diagnosis not present

## 2013-11-27 DIAGNOSIS — R5383 Other fatigue: Secondary | ICD-10-CM

## 2013-11-27 DIAGNOSIS — R5381 Other malaise: Secondary | ICD-10-CM | POA: Diagnosis not present

## 2013-11-27 DIAGNOSIS — K219 Gastro-esophageal reflux disease without esophagitis: Secondary | ICD-10-CM | POA: Diagnosis not present

## 2013-11-27 DIAGNOSIS — C50919 Malignant neoplasm of unspecified site of unspecified female breast: Secondary | ICD-10-CM

## 2013-11-27 DIAGNOSIS — C50911 Malignant neoplasm of unspecified site of right female breast: Secondary | ICD-10-CM

## 2013-11-27 LAB — CBC WITH DIFFERENTIAL/PLATELET
BASO%: 0.2 % (ref 0.0–2.0)
Basophils Absolute: 0 10*3/uL (ref 0.0–0.1)
EOS ABS: 0.1 10*3/uL (ref 0.0–0.5)
EOS%: 1.4 % (ref 0.0–7.0)
HCT: 40.1 % (ref 34.8–46.6)
HEMOGLOBIN: 13 g/dL (ref 11.6–15.9)
LYMPH%: 16.5 % (ref 14.0–49.7)
MCH: 29.1 pg (ref 25.1–34.0)
MCHC: 32.4 g/dL (ref 31.5–36.0)
MCV: 89.7 fL (ref 79.5–101.0)
MONO#: 0.6 10*3/uL (ref 0.1–0.9)
MONO%: 9.6 % (ref 0.0–14.0)
NEUT%: 72.3 % (ref 38.4–76.8)
NEUTROS ABS: 4.5 10*3/uL (ref 1.5–6.5)
PLATELETS: 231 10*3/uL (ref 145–400)
RBC: 4.47 10*6/uL (ref 3.70–5.45)
RDW: 15.2 % — AB (ref 11.2–14.5)
WBC: 6.2 10*3/uL (ref 3.9–10.3)
lymph#: 1 10*3/uL (ref 0.9–3.3)

## 2013-11-27 LAB — COMPREHENSIVE METABOLIC PANEL (CC13)
ALT: 9 U/L (ref 0–55)
ANION GAP: 11 meq/L (ref 3–11)
AST: 14 U/L (ref 5–34)
Albumin: 3.4 g/dL — ABNORMAL LOW (ref 3.5–5.0)
Alkaline Phosphatase: 98 U/L (ref 40–150)
BUN: 11.4 mg/dL (ref 7.0–26.0)
CALCIUM: 9.4 mg/dL (ref 8.4–10.4)
CO2: 24 meq/L (ref 22–29)
Chloride: 106 mEq/L (ref 98–109)
Creatinine: 1.1 mg/dL (ref 0.6–1.1)
GLUCOSE: 110 mg/dL (ref 70–140)
POTASSIUM: 3.7 meq/L (ref 3.5–5.1)
SODIUM: 142 meq/L (ref 136–145)
TOTAL PROTEIN: 7.9 g/dL (ref 6.4–8.3)
Total Bilirubin: 0.55 mg/dL (ref 0.20–1.20)

## 2013-11-27 MED ORDER — SODIUM CHLORIDE 0.9 % IV SOLN
Freq: Once | INTRAVENOUS | Status: AC
  Administered 2013-11-27: 11:00:00 via INTRAVENOUS

## 2013-11-27 MED ORDER — HEPARIN SOD (PORK) LOCK FLUSH 100 UNIT/ML IV SOLN
500.0000 [IU] | Freq: Once | INTRAVENOUS | Status: AC | PRN
  Administered 2013-11-27: 500 [IU]
  Filled 2013-11-27: qty 5

## 2013-11-27 MED ORDER — SODIUM CHLORIDE 0.9 % IJ SOLN
10.0000 mL | INTRAMUSCULAR | Status: DC | PRN
  Administered 2013-11-27: 10 mL
  Filled 2013-11-27: qty 10

## 2013-11-27 MED ORDER — ZOLEDRONIC ACID 4 MG/100ML IV SOLN
4.0000 mg | Freq: Once | INTRAVENOUS | Status: AC
  Administered 2013-11-27: 4 mg via INTRAVENOUS
  Filled 2013-11-27: qty 100

## 2013-11-27 NOTE — Telephone Encounter (Signed)
Per staff message and POF I have scheduled appts.  JMW  

## 2013-11-27 NOTE — Patient Instructions (Signed)
[place] Discharge Instructions for Patients Receiving Chemotherapy  Today you received the following chemotherapy agents Zometa.    To help prevent nausea and vomiting after your treatment, we encourage you to take your nausea medication as prescribed.     If you develop nausea and vomiting that is not controlled by your nausea medication, call the clinic.   BELOW ARE SYMPTOMS THAT SHOULD BE REPORTED IMMEDIATELY:  *FEVER GREATER THAN 100.5 F  *CHILLS WITH OR WITHOUT FEVER  NAUSEA AND VOMITING THAT IS NOT CONTROLLED WITH YOUR NAUSEA MEDICATION  *UNUSUAL SHORTNESS OF BREATH  *UNUSUAL BRUISING OR BLEEDING  TENDERNESS IN MOUTH AND THROAT WITH OR WITHOUT PRESENCE OF ULCERS  *URINARY PROBLEMS  *BOWEL PROBLEMS  UNUSUAL RASH Items with * indicate a potential emergency and should be followed up as soon as possible.  Feel free to call the clinic should you have any questions or concerns. The clinic phone number is [phone].  It was my pleasure to take care of you today!  Leeanne Rio, RN

## 2013-11-27 NOTE — Telephone Encounter (Signed)
Gave pt appt for lab and MD , also gave pt oral contrast, emailed Sharyn Lull regarding Zometa for April 2015

## 2013-11-27 NOTE — Progress Notes (Signed)
[place] [phone]   [phone]  OFFICE PROGRESS NOTE  Walker Kehr, MD [address] [facility] [place] [address]  DIAGNOSIS AND STAGE: Metastatic breast adenocarcinoma, presenting with bone metastasis in the left hip area. This was initially diagnosed as stage II (T2, N1, M0) invasive right breast carcinoma with positive estrogen and progesterone receptors and HER-2/neu positive in July 2002   PRIOR THERAPY:  1. Status post right lumpectomy with right axillary lymph node dissection revealing 4/9 lymph nodes that were positive for malignancy.  2. Status post 8 cycles of systemic chemotherapy with CMF, completed March 2003.  3. Status post radiotherapy to the remaining right breast under the care of Dr. Tammi Klippel.  4. Status post 2 years of treatment with tamoxifen, started July 2003, then switched to Femara by Dr. Humphrey Rolls in August 2005. The patient discontinued the treatment in February 2009.  5. Status post left total hip replacement on Apr 01, 2011, secondary to pathologic left femoral neck fracture.  CURRENT THERAPY:  1. Arimidex 1 mg p.o. daily, started 03/18/2011.  2. Zometa 4 mg IV on 3 monthly basis.  CHEMOTHERAPY INTENT: Palliative  CURRENT # OF HORMONAL THERAPY CYCLES: 33 CURRENT ANTIEMETICS: None  CURRENT SMOKING STATUS: Nonsmoker  ORAL HORMONAL THERAPY AND CONSENT: Yes.  CURRENT BISPHOSPHONATES USE: Zometa with stable renal function.  PAIN MANAGEMENT: Tramadol when necessary  NARCOTICS INDUCED CONSTIPATION: None  LIVING WILL AND CODE STATUS: Full code.   INTERVAL HISTORY:  [name] [name] 73 y.o. female returns to the clinic today for routine three-month followup visit. The patient is feeling fine today with no specific complaints except for pain at the right knee and generalized fatigue. She denied having any other significant complaints. She has no significant weight loss. The patient denied having any  significant chest pain, shortness breath, cough or hemoptysis. She has mild nausea with acid reflux. She is tolerating her treatment with Arimidex fairly well.   MEDICAL HISTORY: Past Medical History  Diagnosis Date  . Asthma   . Hypertension   . Diabetes mellitus type II   . Cerebrovascular accident     R thalamic CVA 01/2010  . Hyperlipemia   . Dyslipidemia   . TIA (transient ischemic attack)     x2 in 2011  . Low back pain   . Osteoarthritis   . Anxiety   . Breast cancer     2002; metastatic in 2012    ALLERGIES:  is allergic to atorvastatin.  MEDICATIONS:  Current Outpatient Prescriptions  Medication Sig Dispense Refill  . albuterol (VENTOLIN HFA) 108 (90 BASE) MCG/ACT inhaler Inhale 2 puffs into the lungs 4 (four) times daily as needed for wheezing.  1 Inhaler  5  . amLODipine (NORVASC) 10 MG tablet TAKE 1 TABLET BY MOUTH EVERY DAY  90 tablet  2  . anastrozole (ARIMIDEX) 1 MG tablet Take 1 tablet (1 mg total) by mouth daily.  90 tablet  2  . Cholecalciferol (VITAMIN D3) 1000 UNITS CAPS Take by mouth.        . Fluticasone-Salmeterol (ADVAIR DISKUS) 250-50 MCG/DOSE AEPB Inhale 1 puff into the lungs as needed.  60 each  5  . losartan-hydrochlorothiazide (HYZAAR) 100-25 MG per tablet Take 1 tablet by mouth daily.  90 tablet  3  . pantoprazole (PROTONIX) 40 MG tablet Take 1 tablet (40 mg total) by mouth daily.  30 tablet  11  . traMADol (ULTRAM) 50 MG  tablet Take 1-2 tablets (50-100 mg total) by mouth 2 (two) times daily as needed for pain.  100 tablet  1  . triamcinolone cream (KENALOG) 0.5 % Apply topically 3 (three) times daily.  60 g  3   No current facility-administered medications for this visit.    SURGICAL HISTORY:  Past Surgical History  Procedure Laterality Date  . Breast lumpectomy      Right breast 2002  . Joint replacement      L hip due to breast ca met    REVIEW OF SYSTEMS:  A comprehensive review of systems was negative except for: Constitutional:  positive for fatigue Musculoskeletal: positive for arthralgias   PHYSICAL EXAMINATION: General appearance: alert, cooperative, fatigued and no distress Head: Normocephalic, without obvious abnormality, atraumatic Neck: no adenopathy, no JVD, supple, symmetrical, trachea midline and thyroid not enlarged, symmetric, no tenderness/mass/nodules Lymph nodes: Cervical, supraclavicular, and axillary nodes normal. Resp: clear to auscultation bilaterally Back: symmetric, no curvature. ROM normal. No CVA tenderness. Cardio: regular rate and rhythm, S1, S2 normal, no murmur, click, rub or gallop GI: soft, non-tender; bowel sounds normal; no masses,  no organomegaly Extremities: extremities normal, atraumatic, no cyanosis or edema  ECOG PERFORMANCE STATUS: 1 - Symptomatic but completely ambulatory  Blood pressure 162/79, pulse 75, temperature 97.3 F (36.3 C), temperature source Oral, resp. rate 20, height 5' 4"  (1.626 m), weight 224 lb 3.2 oz (101.696 kg), SpO2 98.00%.  LABORATORY DATA: Lab Results  Component Value Date   WBC 6.2 11/27/2013   HGB 13.0 11/27/2013   HCT 40.1 11/27/2013   MCV 89.7 11/27/2013   PLT 231 11/27/2013      Chemistry      Component Value Date/Time   NA 140 08/23/2013 0951   NA 139 05/24/2012 1221   NA 140 05/03/2012 1449   K 3.6 08/23/2013 0951   K 4.0 05/24/2012 1221   K 4.3 05/03/2012 1449   CL 109* 04/25/2013 1023   CL 105 05/24/2012 1221   CL 99 05/03/2012 1449   CO2 22 08/23/2013 0951   CO2 25 05/24/2012 1221   CO2 27 05/03/2012 1449   BUN 11.8 08/23/2013 0951   BUN 12 05/24/2012 1221   BUN 14 05/03/2012 1449   CREATININE 1.3* 08/23/2013 0951   CREATININE 1.05 05/24/2012 1221   CREATININE 1.1 05/03/2012 1449      Component Value Date/Time   CALCIUM 9.4 08/23/2013 0951   CALCIUM 9.0 05/24/2012 1221   CALCIUM 9.1 05/03/2012 1449   ALKPHOS 103 08/23/2013 0951   ALKPHOS 78 05/03/2012 1449   ALKPHOS 85 02/02/2012 1534   AST 11 08/23/2013 0951   AST 19 05/03/2012 1449   AST 14  02/02/2012 1534   ALT <6 08/23/2013 0951   ALT 14 05/03/2012 1449   ALT 10 02/02/2012 1534   BILITOT 0.42 08/23/2013 0951   BILITOT 0.50 05/03/2012 1449   BILITOT 0.3 02/02/2012 1534       RADIOGRAPHIC STUDIES: No results found.  ASSESSMENT AND PLAN: This is a very pleasant 73 years old African American female with metastatic breast adenocarcinoma with bone metastasis. The patient is currently on hormonal therapy with Arimidex with no significant evidence for disease progression.  Her recent blood work and tumor markers are within the acceptable range. I recommended for the patient to continue her current treatment with Arimidex. She will also continue on Zometa 4 mg IV every 3 months. She was also advised to take calcium and vitamin D supplement. The patient would  come back for followup visit in 3 months with repeat CBC, comprehensive metabolic panel and CA 68.15 in addition to repeat CT scan of the chest, abdomen and pelvis as well as bone scan for restaging of her disease. I also strongly encouraged the patient to lose weight to help with her arthritis. She was advised to call immediately if she has any concerning symptoms in the interval. The patient voices understanding of current disease status and treatment options and is in agreement with the current care plan.  All questions were answered. The patient knows to call the clinic with any problems, questions or concerns. We can certainly see the patient much sooner if necessary.  Disclaimer: This note was dictated with voice recognition software. Similar sounding words can inadvertently be transcribed and may not be corrected upon review.

## 2013-11-27 NOTE — Patient Instructions (Signed)
Follow up in 3 months with repeat CT scan of the chest, abdomen and pelvis as well as bone scan.

## 2013-11-28 ENCOUNTER — Telehealth: Payer: Self-pay | Admitting: Internal Medicine

## 2013-11-28 LAB — CANCER ANTIGEN 27.29: CA 27.29: 57 U/mL — AB (ref 0–39)

## 2013-11-28 NOTE — Telephone Encounter (Signed)
Gave pt appt for lab and Md and zometa for April 2015

## 2013-12-26 DIAGNOSIS — M1710 Unilateral primary osteoarthritis, unspecified knee: Secondary | ICD-10-CM | POA: Diagnosis not present

## 2013-12-26 DIAGNOSIS — IMO0002 Reserved for concepts with insufficient information to code with codable children: Secondary | ICD-10-CM | POA: Diagnosis not present

## 2013-12-26 DIAGNOSIS — M25569 Pain in unspecified knee: Secondary | ICD-10-CM | POA: Diagnosis not present

## 2014-01-07 ENCOUNTER — Ambulatory Visit: Payer: Medicare Other | Admitting: Internal Medicine

## 2014-01-18 ENCOUNTER — Encounter: Payer: Self-pay | Admitting: Internal Medicine

## 2014-02-13 ENCOUNTER — Encounter: Payer: Self-pay | Admitting: Internal Medicine

## 2014-02-22 ENCOUNTER — Encounter (HOSPITAL_COMMUNITY)
Admission: RE | Admit: 2014-02-22 | Discharge: 2014-02-22 | Disposition: A | Discharge status: Home / Self Care | Payer: Medicare Other | Source: Ambulatory Visit | Attending: Internal Medicine | Admitting: Internal Medicine

## 2014-02-22 ENCOUNTER — Other Ambulatory Visit: Payer: Self-pay | Admitting: *Deleted

## 2014-02-22 ENCOUNTER — Ambulatory Visit (HOSPITAL_COMMUNITY)
Admission: RE | Admit: 2014-02-22 | Discharge: 2014-02-22 | Disposition: A | Discharge status: Home / Self Care | Payer: Medicare Other | Source: Ambulatory Visit | Attending: Internal Medicine | Admitting: Internal Medicine

## 2014-02-22 ENCOUNTER — Other Ambulatory Visit (HOSPITAL_BASED_OUTPATIENT_CLINIC_OR_DEPARTMENT_OTHER): Payer: Medicare Other

## 2014-02-22 ENCOUNTER — Encounter (HOSPITAL_COMMUNITY): Payer: Self-pay

## 2014-02-22 ENCOUNTER — Ambulatory Visit (HOSPITAL_BASED_OUTPATIENT_CLINIC_OR_DEPARTMENT_OTHER): Payer: Medicare Other

## 2014-02-22 VITALS — BP 159/70 | HR 79 | Temp 98.1°F | Resp 20

## 2014-02-22 DIAGNOSIS — C7952 Secondary malignant neoplasm of bone marrow: Secondary | ICD-10-CM | POA: Diagnosis not present

## 2014-02-22 DIAGNOSIS — C50919 Malignant neoplasm of unspecified site of unspecified female breast: Secondary | ICD-10-CM

## 2014-02-22 DIAGNOSIS — Z79899 Other long term (current) drug therapy: Secondary | ICD-10-CM | POA: Diagnosis not present

## 2014-02-22 DIAGNOSIS — C7951 Secondary malignant neoplasm of bone: Secondary | ICD-10-CM

## 2014-02-22 DIAGNOSIS — Z853 Personal history of malignant neoplasm of breast: Secondary | ICD-10-CM | POA: Diagnosis not present

## 2014-02-22 DIAGNOSIS — M25569 Pain in unspecified knee: Secondary | ICD-10-CM | POA: Diagnosis not present

## 2014-02-22 LAB — CBC WITH DIFFERENTIAL/PLATELET
BASO%: 0.6 % (ref 0.0–2.0)
Basophils Absolute: 0 10*3/uL (ref 0.0–0.1)
EOS ABS: 0.1 10*3/uL (ref 0.0–0.5)
EOS%: 1.8 % (ref 0.0–7.0)
HCT: 42.1 % (ref 34.8–46.6)
HEMOGLOBIN: 13.5 g/dL (ref 11.6–15.9)
LYMPH%: 17.4 % (ref 14.0–49.7)
MCH: 29.7 pg (ref 25.1–34.0)
MCHC: 32.2 g/dL (ref 31.5–36.0)
MCV: 92.2 fL (ref 79.5–101.0)
MONO#: 0.6 10*3/uL (ref 0.1–0.9)
MONO%: 10.3 % (ref 0.0–14.0)
NEUT#: 4.1 10*3/uL (ref 1.5–6.5)
NEUT%: 69.9 % (ref 38.4–76.8)
Platelets: 268 10*3/uL (ref 145–400)
RBC: 4.57 10*6/uL (ref 3.70–5.45)
RDW: 15.2 % — AB (ref 11.2–14.5)
WBC: 5.8 10*3/uL (ref 3.9–10.3)
lymph#: 1 10*3/uL (ref 0.9–3.3)

## 2014-02-22 LAB — COMPREHENSIVE METABOLIC PANEL (CC13)
ALBUMIN: 3.5 g/dL (ref 3.5–5.0)
ALT: 8 U/L (ref 0–55)
AST: 14 U/L (ref 5–34)
Alkaline Phosphatase: 99 U/L (ref 40–150)
Anion Gap: 12 mEq/L — ABNORMAL HIGH (ref 3–11)
BUN: 8.4 mg/dL (ref 7.0–26.0)
CO2: 22 mEq/L (ref 22–29)
Calcium: 9.9 mg/dL (ref 8.4–10.4)
Chloride: 106 mEq/L (ref 98–109)
Creatinine: 1.2 mg/dL — ABNORMAL HIGH (ref 0.6–1.1)
GLUCOSE: 109 mg/dL (ref 70–140)
POTASSIUM: 3.6 meq/L (ref 3.5–5.1)
Sodium: 140 mEq/L (ref 136–145)
Total Bilirubin: 0.4 mg/dL (ref 0.20–1.20)
Total Protein: 8.1 g/dL (ref 6.4–8.3)

## 2014-02-22 LAB — CANCER ANTIGEN 27.29: CA 27.29: 82 U/mL — AB (ref 0–39)

## 2014-02-22 MED ORDER — HEPARIN SOD (PORK) LOCK FLUSH 100 UNIT/ML IV SOLN
500.0000 [IU] | Freq: Once | INTRAVENOUS | Status: AC | PRN
  Administered 2014-02-22: 500 [IU]
  Filled 2014-02-22: qty 5

## 2014-02-22 MED ORDER — SODIUM CHLORIDE 0.9 % IV SOLN
Freq: Once | INTRAVENOUS | Status: AC
  Administered 2014-02-22: 13:00:00 via INTRAVENOUS

## 2014-02-22 MED ORDER — SODIUM CHLORIDE 0.9 % IJ SOLN
10.0000 mL | INTRAMUSCULAR | Status: DC | PRN
  Administered 2014-02-22: 10 mL
  Filled 2014-02-22: qty 10

## 2014-02-22 MED ORDER — IOHEXOL 300 MG/ML  SOLN
100.0000 mL | Freq: Once | INTRAMUSCULAR | Status: AC | PRN
  Administered 2014-02-22: 100 mL via INTRAVENOUS

## 2014-02-22 MED ORDER — SODIUM CHLORIDE 0.9 % IJ SOLN
3.0000 mL | Freq: Once | INTRAMUSCULAR | Status: DC | PRN
  Filled 2014-02-22: qty 10

## 2014-02-22 MED ORDER — TECHNETIUM TC 99M MEDRONATE IV KIT
24.9000 | PACK | Freq: Once | INTRAVENOUS | Status: AC | PRN
  Administered 2014-02-22: 24.9 via INTRAVENOUS

## 2014-02-22 MED ORDER — ZOLEDRONIC ACID 4 MG/100ML IV SOLN
4.0000 mg | Freq: Once | INTRAVENOUS | Status: AC
  Administered 2014-02-22: 4 mg via INTRAVENOUS
  Filled 2014-02-22: qty 100

## 2014-02-22 MED ORDER — TECHNETIUM TC 99M SESTAMIBI - CARDIOLITE: 24.9000 | Freq: Once | INTRAVENOUS | Status: DC | PRN

## 2014-02-22 NOTE — Patient Instructions (Signed)

## 2014-02-25 ENCOUNTER — Other Ambulatory Visit: Payer: Medicare Other

## 2014-02-25 ENCOUNTER — Ambulatory Visit (HOSPITAL_BASED_OUTPATIENT_CLINIC_OR_DEPARTMENT_OTHER): Payer: Medicare Other | Admitting: Internal Medicine

## 2014-02-25 ENCOUNTER — Encounter: Payer: Self-pay | Admitting: Internal Medicine

## 2014-02-25 ENCOUNTER — Telehealth: Payer: Self-pay | Admitting: Internal Medicine

## 2014-02-25 VITALS — BP 181/68 | HR 89 | Temp 97.6°F | Resp 20 | Ht 64.0 in | Wt 232.1 lb

## 2014-02-25 DIAGNOSIS — R978 Other abnormal tumor markers: Secondary | ICD-10-CM | POA: Diagnosis not present

## 2014-02-25 DIAGNOSIS — C50919 Malignant neoplasm of unspecified site of unspecified female breast: Secondary | ICD-10-CM | POA: Diagnosis not present

## 2014-02-25 DIAGNOSIS — Z79811 Long term (current) use of aromatase inhibitors: Secondary | ICD-10-CM | POA: Diagnosis not present

## 2014-02-25 DIAGNOSIS — C7952 Secondary malignant neoplasm of bone marrow: Secondary | ICD-10-CM

## 2014-02-25 DIAGNOSIS — C7951 Secondary malignant neoplasm of bone: Secondary | ICD-10-CM

## 2014-02-25 NOTE — Telephone Encounter (Signed)
gv adn printed appts ched and avs for pt for July....sed added tx. °

## 2014-02-25 NOTE — Progress Notes (Signed)
[place] [phone]   [phone]) [phone]  OFFICE PROGRESS NOTE  Walker Kehr, MD [address] [facility] [place] [address]  DIAGNOSIS AND STAGE: Metastatic breast adenocarcinoma, presenting with bone metastasis in the left hip area. This was initially diagnosed as stage II (T2, N1, M0) invasive right breast carcinoma with positive estrogen and progesterone receptors and HER-2/neu positive in July 2002   PRIOR THERAPY:  1. Status post right lumpectomy with right axillary lymph node dissection revealing 4/9 lymph nodes that were positive for malignancy.  2. Status post 8 cycles of systemic chemotherapy with CMF, completed March 2003.  3. Status post radiotherapy to the remaining right breast under the care of Dr. Tammi Klippel.  4. Status post 2 years of treatment with tamoxifen, started July 2003, then switched to Femara by Dr. Humphrey Rolls in August 2005. The patient discontinued the treatment in February 2009.  5. Status post left total hip replacement on Apr 01, 2011, secondary to pathologic left femoral neck fracture.  CURRENT THERAPY:  1. Arimidex 1 mg p.o. daily, started 03/18/2011.  2. Zometa 4 mg IV on 3 monthly basis.  CHEMOTHERAPY INTENT: Palliative  CURRENT # OF HORMONAL THERAPY CYCLES: 36 CURRENT ANTIEMETICS: None  CURRENT SMOKING STATUS: Nonsmoker  ORAL HORMONAL THERAPY AND CONSENT: Yes.  CURRENT BISPHOSPHONATES USE: Zometa with stable renal function.  PAIN MANAGEMENT: Tramadol when necessary  NARCOTICS INDUCED CONSTIPATION: None  LIVING WILL AND CODE STATUS: Full code.   INTERVAL HISTORY:  [name] [name] 73 y.o. female returns to the clinic today for routine three-month followup visit. The patient is feeling fine today with no specific complaints except for pain at the right knee and generalized fatigue. She has no significant weight loss. The patient denied having any significant chest pain, but has shortness of breath  with exertion with no cough or hemoptysis. She has mild nausea with acid reflux. She is tolerating her treatment with Arimidex fairly well. She was not taking her Arimidex as prescribed on daily basis because of some insurance coverage issues. She had repeat CT scan of the chest, abdomen and pelvis in addition to bone scan performed recently and she is here for evaluation and discussion of her scan results.  MEDICAL HISTORY: Past Medical History  Diagnosis Date  . Asthma   . Hypertension   . Cerebrovascular accident     R thalamic CVA 01/2010  . Hyperlipemia   . Dyslipidemia   . TIA (transient ischemic attack)     x2 in 2011  . Low back pain   . Osteoarthritis   . Anxiety   . Breast cancer     2002; metastatic in 2012  . Diabetes mellitus type II     pt.states she's not diabetic    ALLERGIES:  is allergic to atorvastatin.  MEDICATIONS:  Current Outpatient Prescriptions  Medication Sig Dispense Refill  . amLODipine (NORVASC) 10 MG tablet TAKE 1 TABLET BY MOUTH EVERY DAY  90 tablet  2  . anastrozole (ARIMIDEX) 1 MG tablet Take 1 tablet (1 mg total) by mouth daily.  90 tablet  2  . Cholecalciferol (VITAMIN D3) 1000 UNITS CAPS Take by mouth.        . Fluticasone-Salmeterol (ADVAIR DISKUS) 250-50 MCG/DOSE AEPB Inhale 1 puff into the lungs as needed.  60 each  5  . losartan-hydrochlorothiazide (HYZAAR) 100-25 MG per tablet Take 1 tablet by mouth daily.  90 tablet  3  . pantoprazole (  PROTONIX) 40 MG tablet Take 1 tablet (40 mg total) by mouth daily.  30 tablet  11  . triamcinolone cream (KENALOG) 0.5 % Apply topically 3 (three) times daily.  60 g  3  . albuterol (VENTOLIN HFA) 108 (90 BASE) MCG/ACT inhaler Inhale 2 puffs into the lungs 4 (four) times daily as needed for wheezing.  1 Inhaler  5  . traMADol (ULTRAM) 50 MG tablet Take 1-2 tablets (50-100 mg total) by mouth 2 (two) times daily as needed for pain.  100 tablet  1   No current facility-administered medications for this visit.      SURGICAL HISTORY:  Past Surgical History  Procedure Laterality Date  . Breast lumpectomy      Right breast 2002  . Joint replacement      L hip due to breast ca met    REVIEW OF SYSTEMS:  A comprehensive review of systems was negative except for: Constitutional: positive for fatigue Musculoskeletal: positive for arthralgias   PHYSICAL EXAMINATION: General appearance: alert, cooperative, fatigued and no distress Head: Normocephalic, without obvious abnormality, atraumatic Neck: no adenopathy, no JVD, supple, symmetrical, trachea midline and thyroid not enlarged, symmetric, no tenderness/mass/nodules Lymph nodes: Cervical, supraclavicular, and axillary nodes normal. Resp: clear to auscultation bilaterally Back: symmetric, no curvature. ROM normal. No CVA tenderness. Cardio: regular rate and rhythm, S1, S2 normal, no murmur, click, rub or gallop GI: soft, non-tender; bowel sounds normal; no masses,  no organomegaly Extremities: extremities normal, atraumatic, no cyanosis or edema  ECOG PERFORMANCE STATUS: 1 - Symptomatic but completely ambulatory  Blood pressure 181/68, pulse 89, temperature 97.6 F (36.4 C), temperature source Oral, resp. rate 20, height _0  (1.626 m), weight 232 lb 1.6 oz (105.28 kg), SpO2 100.00%.  LABORATORY DATA: Lab Results  Component Value Date   WBC 5.8 02/22/2014   HGB 13.5 02/22/2014   HCT 42.1 02/22/2014   MCV 92.2 02/22/2014   PLT 268 02/22/2014      Chemistry      Component Value Date/Time   NA 140 02/22/2014 0919   NA 139 05/24/2012 1221   NA 140 05/03/2012 1449   K 3.6 02/22/2014 0919   K 4.0 05/24/2012 1221   K 4.3 05/03/2012 1449   CL 109* 04/25/2013 1023   CL 105 05/24/2012 1221   CL 99 05/03/2012 1449   CO2 22 02/22/2014 0919   CO2 25 05/24/2012 1221   CO2 27 05/03/2012 1449   BUN 8.4 02/22/2014 0919   BUN 12 05/24/2012 1221   BUN 14 05/03/2012 1449   CREATININE 1.2* 02/22/2014 0919   CREATININE 1.05 05/24/2012 1221   CREATININE 1.1 05/03/2012 1449       Component Value Date/Time   CALCIUM 9.9 02/22/2014 0919   CALCIUM 9.0 05/24/2012 1221   CALCIUM 9.1 05/03/2012 1449   ALKPHOS 99 02/22/2014 0919   ALKPHOS 78 05/03/2012 1449   ALKPHOS 85 02/02/2012 1534   AST 14 02/22/2014 0919   AST 19 05/03/2012 1449   AST 14 02/02/2012 1534   ALT 8 02/22/2014 0919   ALT 14 05/03/2012 1449   ALT 10 02/02/2012 1534   BILITOT 0.40 02/22/2014 0919   BILITOT 0.50 05/03/2012 1449   BILITOT 0.3 02/02/2012 1534       RADIOGRAPHIC STUDIES: Ct Chest W Contrast  02/22/2014   CLINICAL DATA:  Breast cancer diagnosed in 2002. Bone metastasis diagnosed in 2012. Right lumpectomy. Chemotherapy in progress.  EXAM: CT CHEST, ABDOMEN, AND PELVIS WITH CONTRAST  TECHNIQUE: Multidetector  CT imaging of the chest, abdomen and pelvis was performed following the standard protocol during bolus administration of intravenous contrast.  CONTRAST:  147m OMNIPAQUE IOHEXOL 300 MG/ML  SOLN  COMPARISON:  02/23/2013  FINDINGS: CT CHEST FINDINGS  Lungs/Pleura: Radiation fibrosis in the anterior right upper lobe.  No suspicious pulmonary nodule or mass.  No pleural fluid.  Heart/Mediastinum: No supraclavicular adenopathy. A left-sided Port-A-Cath which terminates at the superior caval/ atrial junction. Subcentimeter left thyroid nodule, nonspecific.  Right axillary node dissection with right-sided lumpectomy. No axillary adenopathy. Mild cardiomegaly. Tortuous thoracic aorta. Pulmonary arteries are mildly enlarged. This is chronic. No central pulmonary embolism, on this non-dedicated study. No mediastinal or hilar adenopathy. A small hiatal hernia.  CT ABDOMEN AND PELVIS FINDINGS  Abdomen/Pelvis: Suspicion of mild hepatic steatosis. No focal liver lesion. Normal spleen, stomach, pancreas, adrenal glands. Cholelithiasis without acute cholecystitis or biliary ductal dilatation.  Aortic and branch vessel atherosclerosis. No retroperitoneal or retrocrural adenopathy. Scattered colonic diverticula. Normal terminal  ileum and appendix. Normal small bowel without abdominal ascites. No pelvic adenopathy. Hysterectomy. No adnexal mass or significant free fluid. Beam hardening artifact from left hip arthroplasty. No evidence of omental or peritoneal disease.  Bones/Musculoskeletal: Right hip osteoarthritis.  IMPRESSION: CT CHEST IMPRESSION  1.  No acute process or evidence of metastatic disease in the chest. 2. Pulmonary artery enlargement suggests pulmonary arterial hypertension.  CT ABDOMEN AND PELVIS IMPRESSION  1. No acute process or evidence of metastatic disease in the abdomen or pelvis. 2. Cholelithiasis. 3. Mildly degraded evaluation of the pelvis, secondary to beam hardening artifact from left hip arthroplasty.   Electronically Signed   By: KAbigail MiyamotoM.D.   On: 02/22/2014 12:21   Nm Bone Scan Whole Body  02/22/2014   CLINICAL DATA:  History of breast cancer with osseous metastatic disease. Right knee pain. No recent trauma.  EXAM: NUCLEAR MEDICINE WHOLE BODY BONE SCAN  TECHNIQUE: Whole body anterior and posterior images were obtained approximately 3 hours after intravenous injection of radiopharmaceutical.  RADIOPHARMACEUTICALS:  24.9 mCi Technetium-99 MDP  COMPARISON:  Whole body bone scan 02/26/2011, PET-CT 07/23/2011 and CTs of the chest, abdomen and pelvis 02/22/2014.  FINDINGS: Compared with the prior study, there is increased activity within the lower thoracic and lower lumbar spine. The lumbar spine activity is greatest posteriorly on the left in the region of the L3-4 and L4-5 facet joints and corresponds with arthropathy on CT. There is also progressive degenerative activity at the right hip joint. Additional scattered arthropathic changes in the shoulders, knees and right midfoot are stable. There is no osseous activity favoring metastatic disease.  IMPRESSION: 1. In conjunction with today CTs, there are no findings worrisome for osseous metastatic disease. 2. Worsening arthropathic changes, especially in  the lower lumbar spine and right hip.   Electronically Signed   By: BCamie PatienceM.D.   On: 02/22/2014 13:03   ASSESSMENT AND PLAN: This is a very pleasant 73years old [place] female with metastatic breast adenocarcinoma with bone metastasis. The patient is currently on hormonal therapy with Arimidex with no significant evidence for disease progression.  Her recent scan showed no evidence for disease progression, but the tumor marker CA 27.29 is elevated today, but this could be the result of noncompliance with her oral treatment with Arimidex. I strongly encouraged the patient to take her medication as prescribed. I discussed the scan and lab results with the patient. I recommended for the patient to continue her current treatment with Arimidex.  She will also continue on Zometa 4 mg IV every 3 months. She was also advised to take calcium and vitamin D supplement. The patient would come back for followup visit in 3 months with repeat CBC, comprehensive metabolic panel and CA 97.96. I also strongly encouraged the patient to lose weight to help with her arthritis.  She was advised to call immediately if she has any concerning symptoms in the interval. The patient voices understanding of current disease status and treatment options and is in agreement with the current care plan.  All questions were answered. The patient knows to call the clinic with any problems, questions or concerns. We can certainly see the patient much sooner if necessary.  Disclaimer: This note was dictated with voice recognition software. Similar sounding words can inadvertently be transcribed and may not be corrected upon review.

## 2014-05-13 ENCOUNTER — Other Ambulatory Visit (HOSPITAL_BASED_OUTPATIENT_CLINIC_OR_DEPARTMENT_OTHER): Payer: Medicare Other

## 2014-05-13 DIAGNOSIS — C7951 Secondary malignant neoplasm of bone: Secondary | ICD-10-CM

## 2014-05-13 DIAGNOSIS — C50919 Malignant neoplasm of unspecified site of unspecified female breast: Secondary | ICD-10-CM | POA: Diagnosis not present

## 2014-05-13 DIAGNOSIS — R978 Other abnormal tumor markers: Secondary | ICD-10-CM

## 2014-05-13 DIAGNOSIS — C50219 Malignant neoplasm of upper-inner quadrant of unspecified female breast: Secondary | ICD-10-CM | POA: Diagnosis not present

## 2014-05-13 DIAGNOSIS — C7952 Secondary malignant neoplasm of bone marrow: Secondary | ICD-10-CM

## 2014-05-13 LAB — COMPREHENSIVE METABOLIC PANEL (CC13)
ALT: 11 U/L (ref 0–55)
ANION GAP: 9 meq/L (ref 3–11)
AST: 13 U/L (ref 5–34)
Albumin: 3.2 g/dL — ABNORMAL LOW (ref 3.5–5.0)
Alkaline Phosphatase: 87 U/L (ref 40–150)
BUN: 7.4 mg/dL (ref 7.0–26.0)
CALCIUM: 9.2 mg/dL (ref 8.4–10.4)
CHLORIDE: 110 meq/L — AB (ref 98–109)
CO2: 23 mEq/L (ref 22–29)
CREATININE: 1.1 mg/dL (ref 0.6–1.1)
GLUCOSE: 135 mg/dL (ref 70–140)
Potassium: 3.6 mEq/L (ref 3.5–5.1)
Sodium: 142 mEq/L (ref 136–145)
Total Bilirubin: 0.46 mg/dL (ref 0.20–1.20)
Total Protein: 7.6 g/dL (ref 6.4–8.3)

## 2014-05-13 LAB — CBC WITH DIFFERENTIAL/PLATELET
BASO%: 0.5 % (ref 0.0–2.0)
BASOS ABS: 0 10*3/uL (ref 0.0–0.1)
EOS ABS: 0.1 10*3/uL (ref 0.0–0.5)
EOS%: 1.6 % (ref 0.0–7.0)
HEMATOCRIT: 39.8 % (ref 34.8–46.6)
HEMOGLOBIN: 12.7 g/dL (ref 11.6–15.9)
LYMPH#: 0.8 10*3/uL — AB (ref 0.9–3.3)
LYMPH%: 14.5 % (ref 14.0–49.7)
MCH: 29.3 pg (ref 25.1–34.0)
MCHC: 32 g/dL (ref 31.5–36.0)
MCV: 91.7 fL (ref 79.5–101.0)
MONO#: 0.5 10*3/uL (ref 0.1–0.9)
MONO%: 8.9 % (ref 0.0–14.0)
NEUT#: 4.1 10*3/uL (ref 1.5–6.5)
NEUT%: 74.5 % (ref 38.4–76.8)
Platelets: 255 10*3/uL (ref 145–400)
RBC: 4.34 10*6/uL (ref 3.70–5.45)
RDW: 14.6 % — ABNORMAL HIGH (ref 11.2–14.5)
WBC: 5.4 10*3/uL (ref 3.9–10.3)

## 2014-05-13 LAB — CANCER ANTIGEN 27.29: CA 27.29: 66 U/mL — AB (ref 0–39)

## 2014-05-20 ENCOUNTER — Ambulatory Visit (HOSPITAL_BASED_OUTPATIENT_CLINIC_OR_DEPARTMENT_OTHER): Payer: Medicare Other

## 2014-05-20 ENCOUNTER — Encounter: Payer: Self-pay | Admitting: Internal Medicine

## 2014-05-20 ENCOUNTER — Ambulatory Visit (HOSPITAL_BASED_OUTPATIENT_CLINIC_OR_DEPARTMENT_OTHER): Payer: Medicare Other | Admitting: Internal Medicine

## 2014-05-20 ENCOUNTER — Telehealth: Payer: Self-pay | Admitting: Internal Medicine

## 2014-05-20 VITALS — BP 145/79 | HR 76 | Temp 98.0°F | Resp 16 | Ht 64.0 in | Wt 226.5 lb

## 2014-05-20 DIAGNOSIS — C50919 Malignant neoplasm of unspecified site of unspecified female breast: Secondary | ICD-10-CM

## 2014-05-20 DIAGNOSIS — C7951 Secondary malignant neoplasm of bone: Secondary | ICD-10-CM

## 2014-05-20 DIAGNOSIS — C7952 Secondary malignant neoplasm of bone marrow: Secondary | ICD-10-CM | POA: Diagnosis not present

## 2014-05-20 DIAGNOSIS — C773 Secondary and unspecified malignant neoplasm of axilla and upper limb lymph nodes: Secondary | ICD-10-CM

## 2014-05-20 MED ORDER — HEPARIN SOD (PORK) LOCK FLUSH 100 UNIT/ML IV SOLN
500.0000 [IU] | Freq: Once | INTRAVENOUS | Status: AC | PRN
  Administered 2014-05-20: 500 [IU]
  Filled 2014-05-20: qty 5

## 2014-05-20 MED ORDER — ZOLEDRONIC ACID 4 MG/100ML IV SOLN
4.0000 mg | Freq: Once | INTRAVENOUS | Status: AC
  Administered 2014-05-20: 4 mg via INTRAVENOUS
  Filled 2014-05-20: qty 100

## 2014-05-20 MED ORDER — SODIUM CHLORIDE 0.9 % IV SOLN
Freq: Once | INTRAVENOUS | Status: AC
  Administered 2014-05-20: 11:00:00 via INTRAVENOUS

## 2014-05-20 MED ORDER — SODIUM CHLORIDE 0.9 % IJ SOLN
10.0000 mL | INTRAMUSCULAR | Status: DC | PRN
  Administered 2014-05-20: 10 mL
  Filled 2014-05-20: qty 10

## 2014-05-20 NOTE — Patient Instructions (Signed)

## 2014-05-20 NOTE — Progress Notes (Signed)
[facility] [phone]   [phone]  OFFICE PROGRESS NOTE  Walker Kehr, MD [place] [address]  DIAGNOSIS AND STAGE: Metastatic breast adenocarcinoma, presenting with bone metastasis in the left hip area. This was initially diagnosed as stage II (T2, N1, M0) invasive right breast carcinoma with positive estrogen and progesterone receptors and HER-2/neu positive in July 2002   PRIOR THERAPY:  1. Status post right lumpectomy with right axillary lymph node dissection revealing 4/9 lymph nodes that were positive for malignancy.  2. Status post 8 cycles of systemic chemotherapy with CMF, completed March 2003.  3. Status post radiotherapy to the remaining right breast under the care of Dr. Tammi Klippel.  4. Status post 2 years of treatment with tamoxifen, started July 2003, then switched to Femara by Dr. Humphrey Rolls in August 2005. The patient discontinued the treatment in February 2009.  5. Status post left total hip replacement on Apr 01, 2011, secondary to pathologic left femoral neck fracture.  CURRENT THERAPY:  1. Arimidex 1 mg p.o. daily, started 03/18/2011.  2. Zometa 4 mg IV on 3 monthly basis.  CHEMOTHERAPY INTENT: Palliative  CURRENT # OF HORMONAL THERAPY CYCLES: 39 CURRENT ANTIEMETICS: None  CURRENT SMOKING STATUS: Nonsmoker  ORAL HORMONAL THERAPY AND CONSENT: Yes.  CURRENT BISPHOSPHONATES USE: Zometa with stable renal function.  PAIN MANAGEMENT: Tramadol when necessary  NARCOTICS INDUCED CONSTIPATION: None  LIVING WILL AND CODE STATUS: Full code.   INTERVAL HISTORY:  [name] [name] 73 y.o. female returns to the clinic today for routine three-month followup visit. The patient is feeling fine today with no specific complaints except for pain at the right knee. She has no significant weight loss. The patient denied having any significant chest pain, but has shortness of breath with exertion with no cough or hemoptysis. She is  tolerating her treatment with Arimidex fairly well. Repeat bloodwork performed recently and she is here for evaluation and discussion of her lab results. She is scheduled for Zometa infusion today.   MEDICAL HISTORY: Past Medical History  Diagnosis Date  . Asthma   . Hypertension   . Cerebrovascular accident     R thalamic CVA 01/2010  . Hyperlipemia   . Dyslipidemia   . TIA (transient ischemic attack)     x2 in 2011  . Low back pain   . Osteoarthritis   . Anxiety   . Breast cancer     2002; metastatic in 2012  . Diabetes mellitus type II     pt.states she's not diabetic    ALLERGIES:  is allergic to atorvastatin.  MEDICATIONS:  Current Outpatient Prescriptions  Medication Sig Dispense Refill  . amLODipine (NORVASC) 10 MG tablet TAKE 1 TABLET BY MOUTH EVERY DAY  90 tablet  2  . anastrozole (ARIMIDEX) 1 MG tablet Take 1 tablet (1 mg total) by mouth daily.  90 tablet  2  . Cholecalciferol (VITAMIN D3) 1000 UNITS CAPS Take by mouth.        . Fluticasone-Salmeterol (ADVAIR DISKUS) 250-50 MCG/DOSE AEPB Inhale 1 puff into the lungs as needed.  60 each  5  . losartan-hydrochlorothiazide (HYZAAR) 100-25 MG per tablet Take 1 tablet by mouth daily.  90 tablet  3  . Naproxen Sodium (ALEVE) 220 MG CAPS Take 1 capsule by mouth daily as needed.      . pantoprazole (PROTONIX) 40 MG tablet Take 1 tablet (40 mg total) by mouth daily.  30 tablet  11  .  triamcinolone cream (KENALOG) 0.5 % Apply topically 3 (three) times daily.  60 g  3  . traMADol (ULTRAM) 50 MG tablet Take 1-2 tablets (50-100 mg total) by mouth 2 (two) times daily as needed for pain.  100 tablet  1   No current facility-administered medications for this visit.    SURGICAL HISTORY:  Past Surgical History  Procedure Laterality Date  . Breast lumpectomy      Right breast 2002  . Joint replacement      L hip due to breast ca met    REVIEW OF SYSTEMS:  A comprehensive review of systems was negative except for:  Constitutional: positive for fatigue Musculoskeletal: positive for arthralgias   PHYSICAL EXAMINATION: General appearance: alert, cooperative, fatigued and no distress Head: Normocephalic, without obvious abnormality, atraumatic Neck: no adenopathy, no JVD, supple, symmetrical, trachea midline and thyroid not enlarged, symmetric, no tenderness/mass/nodules Lymph nodes: Cervical, supraclavicular, and axillary nodes normal. Resp: clear to auscultation bilaterally Back: symmetric, no curvature. ROM normal. No CVA tenderness. Cardio: regular rate and rhythm, S1, S2 normal, no murmur, click, rub or gallop GI: soft, non-tender; bowel sounds normal; no masses,  no organomegaly Extremities: extremities normal, atraumatic, no cyanosis or edema  ECOG PERFORMANCE STATUS: 1 - Symptomatic but completely ambulatory  Blood pressure 145/79, pulse 76, temperature 98 F (36.7 C), temperature source Oral, resp. rate 16, height _0  (1.626 m), weight 226 lb 8 oz (102.74 kg), SpO2 97.00%.  LABORATORY DATA: Lab Results  Component Value Date   WBC 5.4 05/13/2014   HGB 12.7 05/13/2014   HCT 39.8 05/13/2014   MCV 91.7 05/13/2014   PLT 255 05/13/2014      Chemistry      Component Value Date/Time   NA 142 05/13/2014 0953   NA 139 05/24/2012 1221   NA 140 05/03/2012 1449   K 3.6 05/13/2014 0953   K 4.0 05/24/2012 1221   K 4.3 05/03/2012 1449   CL 109* 04/25/2013 1023   CL 105 05/24/2012 1221   CL 99 05/03/2012 1449   CO2 23 05/13/2014 0953   CO2 25 05/24/2012 1221   CO2 27 05/03/2012 1449   BUN 7.4 05/13/2014 0953   BUN 12 05/24/2012 1221   BUN 14 05/03/2012 1449   CREATININE 1.1 05/13/2014 0953   CREATININE 1.05 05/24/2012 1221   CREATININE 1.1 05/03/2012 1449      Component Value Date/Time   CALCIUM 9.2 05/13/2014 0953   CALCIUM 9.0 05/24/2012 1221   CALCIUM 9.1 05/03/2012 1449   ALKPHOS 87 05/13/2014 0953   ALKPHOS 78 05/03/2012 1449   ALKPHOS 85 02/02/2012 1534   AST 13 05/13/2014 0953   AST 19 05/03/2012 1449   AST 14  02/02/2012 1534   ALT 11 05/13/2014 0953   ALT 14 05/03/2012 1449   ALT 10 02/02/2012 1534   BILITOT 0.46 05/13/2014 0953   BILITOT 0.50 05/03/2012 1449   BILITOT 0.3 02/02/2012 1534     CA 27.29: 66  RADIOGRAPHIC STUDIES:  ASSESSMENT AND PLAN: This is a very pleasant 73 years old African American female with metastatic breast adenocarcinoma with bone metastasis. The patient is currently on hormonal therapy with Arimidex with no significant evidence for disease progression.  Her recent scan showed no evidence for disease progression, but the tumor marker CA 27.29 is elevated today but improved compared to 3 months ago. I discussed the scan and lab results with the patient. I recommended for the patient to continue her current treatment with Arimidex.  She will also continue on Zometa 4 mg IV every 3 months. She was also advised to take calcium and vitamin D supplement. The patient would come back for followup visit in 3 months with repeat CBC, comprehensive metabolic panel and CA 97.96. I also strongly encouraged the patient to lose weight to help with her arthritis.  She was advised to call immediately if she has any concerning symptoms in the interval. The patient voices understanding of current disease status and treatment options and is in agreement with the current care plan.  All questions were answered. The patient knows to call the clinic with any problems, questions or concerns. We can certainly see the patient much sooner if necessary.  Disclaimer: This note was dictated with voice recognition software. Similar sounding words can inadvertently be transcribed and may not be corrected upon review.

## 2014-05-20 NOTE — Telephone Encounter (Signed)
gv adn printed appt sched and avs for pt for OCT....sed added tx. °

## 2014-05-27 ENCOUNTER — Other Ambulatory Visit: Payer: Medicare Other

## 2014-05-27 ENCOUNTER — Ambulatory Visit: Payer: Medicare Other | Admitting: Internal Medicine

## 2014-06-29 ENCOUNTER — Emergency Department (HOSPITAL_COMMUNITY)
Admission: EM | Admit: 2014-06-29 | Discharge: 2014-06-29 | Disposition: A | Discharge status: Home / Self Care | Payer: Medicare Other | Attending: Emergency Medicine | Admitting: Emergency Medicine

## 2014-06-29 ENCOUNTER — Emergency Department (HOSPITAL_COMMUNITY): Payer: Medicare Other

## 2014-06-29 ENCOUNTER — Encounter (HOSPITAL_COMMUNITY): Payer: Self-pay | Admitting: Emergency Medicine

## 2014-06-29 DIAGNOSIS — R5383 Other fatigue: Secondary | ICD-10-CM

## 2014-06-29 DIAGNOSIS — I10 Essential (primary) hypertension: Secondary | ICD-10-CM | POA: Diagnosis not present

## 2014-06-29 DIAGNOSIS — J45901 Unspecified asthma with (acute) exacerbation: Secondary | ICD-10-CM | POA: Diagnosis not present

## 2014-06-29 DIAGNOSIS — E785 Hyperlipidemia, unspecified: Secondary | ICD-10-CM | POA: Diagnosis not present

## 2014-06-29 DIAGNOSIS — Z8673 Personal history of transient ischemic attack (TIA), and cerebral infarction without residual deficits: Secondary | ICD-10-CM | POA: Insufficient documentation

## 2014-06-29 DIAGNOSIS — M1990 Unspecified osteoarthritis, unspecified site: Secondary | ICD-10-CM | POA: Diagnosis not present

## 2014-06-29 DIAGNOSIS — H5500 Unspecified nystagmus: Secondary | ICD-10-CM | POA: Diagnosis not present

## 2014-06-29 DIAGNOSIS — Z79899 Other long term (current) drug therapy: Secondary | ICD-10-CM | POA: Diagnosis not present

## 2014-06-29 DIAGNOSIS — R079 Chest pain, unspecified: Secondary | ICD-10-CM | POA: Diagnosis not present

## 2014-06-29 DIAGNOSIS — R404 Transient alteration of awareness: Secondary | ICD-10-CM | POA: Diagnosis not present

## 2014-06-29 DIAGNOSIS — Z87891 Personal history of nicotine dependence: Secondary | ICD-10-CM | POA: Diagnosis not present

## 2014-06-29 DIAGNOSIS — H81399 Other peripheral vertigo, unspecified ear: Secondary | ICD-10-CM | POA: Diagnosis not present

## 2014-06-29 DIAGNOSIS — R6883 Chills (without fever): Secondary | ICD-10-CM | POA: Diagnosis not present

## 2014-06-29 DIAGNOSIS — R0602 Shortness of breath: Secondary | ICD-10-CM | POA: Diagnosis not present

## 2014-06-29 DIAGNOSIS — R110 Nausea: Secondary | ICD-10-CM | POA: Insufficient documentation

## 2014-06-29 DIAGNOSIS — Z853 Personal history of malignant neoplasm of breast: Secondary | ICD-10-CM | POA: Insufficient documentation

## 2014-06-29 DIAGNOSIS — C50919 Malignant neoplasm of unspecified site of unspecified female breast: Secondary | ICD-10-CM | POA: Diagnosis not present

## 2014-06-29 DIAGNOSIS — E119 Type 2 diabetes mellitus without complications: Secondary | ICD-10-CM | POA: Insufficient documentation

## 2014-06-29 DIAGNOSIS — R5381 Other malaise: Secondary | ICD-10-CM | POA: Insufficient documentation

## 2014-06-29 DIAGNOSIS — R42 Dizziness and giddiness: Secondary | ICD-10-CM

## 2014-06-29 DIAGNOSIS — Z8659 Personal history of other mental and behavioral disorders: Secondary | ICD-10-CM | POA: Insufficient documentation

## 2014-06-29 LAB — CBC WITH DIFFERENTIAL/PLATELET
Basophils Absolute: 0 10*3/uL (ref 0.0–0.1)
Basophils Relative: 0 % (ref 0–1)
Eosinophils Absolute: 0.1 10*3/uL (ref 0.0–0.7)
Eosinophils Relative: 1 % (ref 0–5)
HCT: 37.9 % (ref 36.0–46.0)
Hemoglobin: 12.5 g/dL (ref 12.0–15.0)
LYMPHS ABS: 0.7 10*3/uL (ref 0.7–4.0)
LYMPHS PCT: 13 % (ref 12–46)
MCH: 29.7 pg (ref 26.0–34.0)
MCHC: 33 g/dL (ref 30.0–36.0)
MCV: 90 fL (ref 78.0–100.0)
Monocytes Absolute: 0.6 10*3/uL (ref 0.1–1.0)
Monocytes Relative: 11 % (ref 3–12)
NEUTROS ABS: 4.2 10*3/uL (ref 1.7–7.7)
NEUTROS PCT: 75 % (ref 43–77)
Platelets: 222 10*3/uL (ref 150–400)
RBC: 4.21 MIL/uL (ref 3.87–5.11)
RDW: 14.5 % (ref 11.5–15.5)
WBC: 5.6 10*3/uL (ref 4.0–10.5)

## 2014-06-29 LAB — I-STAT CG4 LACTIC ACID, ED: LACTIC ACID, VENOUS: 1.09 mmol/L (ref 0.5–2.2)

## 2014-06-29 LAB — URINALYSIS, ROUTINE W REFLEX MICROSCOPIC
Bilirubin Urine: NEGATIVE
GLUCOSE, UA: NEGATIVE mg/dL
HGB URINE DIPSTICK: NEGATIVE
Ketones, ur: NEGATIVE mg/dL
Nitrite: NEGATIVE
PROTEIN: NEGATIVE mg/dL
SPECIFIC GRAVITY, URINE: 1.024 (ref 1.005–1.030)
Urobilinogen, UA: 0.2 mg/dL (ref 0.0–1.0)
pH: 7 (ref 5.0–8.0)

## 2014-06-29 LAB — COMPREHENSIVE METABOLIC PANEL
ALT: 8 U/L (ref 0–35)
AST: 14 U/L (ref 0–37)
Albumin: 3.1 g/dL — ABNORMAL LOW (ref 3.5–5.2)
Alkaline Phosphatase: 95 U/L (ref 39–117)
Anion gap: 12 (ref 5–15)
BUN: 12 mg/dL (ref 6–23)
CO2: 22 meq/L (ref 19–32)
Calcium: 8.9 mg/dL (ref 8.4–10.5)
Chloride: 105 mEq/L (ref 96–112)
Creatinine, Ser: 0.98 mg/dL (ref 0.50–1.10)
GFR, EST AFRICAN AMERICAN: 66 mL/min — AB (ref >90)
GFR, EST NON AFRICAN AMERICAN: 57 mL/min — AB (ref >90)
GLUCOSE: 109 mg/dL — AB (ref 70–99)
POTASSIUM: 3.8 meq/L (ref 3.7–5.3)
Sodium: 139 mEq/L (ref 137–147)
Total Bilirubin: 0.4 mg/dL (ref 0.3–1.2)
Total Protein: 7.2 g/dL (ref 6.0–8.3)

## 2014-06-29 LAB — URINE MICROSCOPIC-ADD ON

## 2014-06-29 LAB — TROPONIN I

## 2014-06-29 MED ORDER — HEPARIN SOD (PORK) LOCK FLUSH 100 UNIT/ML IV SOLN
500.0000 [IU] | INTRAVENOUS | Status: DC | PRN
  Filled 2014-06-29 (×2): qty 5

## 2014-06-29 MED ORDER — MECLIZINE HCL 25 MG PO TABS
25.0000 mg | ORAL_TABLET | Freq: Once | ORAL | Status: AC
  Administered 2014-06-29: 25 mg via ORAL
  Filled 2014-06-29: qty 1

## 2014-06-29 MED ORDER — MECLIZINE HCL 12.5 MG PO TABS: 12.5000 mg | ORAL_TABLET | Freq: Two times a day (BID) | ORAL | Status: AC | PRN

## 2014-06-29 MED ORDER — HEPARIN SOD (PORK) LOCK FLUSH 100 UNIT/ML IV SOLN
500.0000 [IU] | INTRAVENOUS | Status: DC
  Administered 2014-06-29: 500 [IU]
  Filled 2014-06-29: qty 5

## 2014-06-29 MED ORDER — SODIUM CHLORIDE 0.9 % IV BOLUS (SEPSIS)
500.0000 mL | Freq: Once | INTRAVENOUS | Status: AC
  Administered 2014-06-29: 500 mL via INTRAVENOUS

## 2014-06-29 MED ORDER — MECLIZINE HCL 25 MG PO TABS
12.5000 mg | ORAL_TABLET | Freq: Once | ORAL | Status: AC
  Administered 2014-06-29: 12.5 mg via ORAL
  Filled 2014-06-29: qty 1

## 2014-06-29 MED ORDER — IOHEXOL 350 MG/ML SOLN
80.0000 mL | Freq: Once | INTRAVENOUS | Status: AC | PRN
  Administered 2014-06-29: 80 mL via INTRAVENOUS

## 2014-06-29 MED ORDER — LISINOPRIL 20 MG PO TABS: 20.0000 mg | ORAL_TABLET | Freq: Once | ORAL | Status: DC

## 2014-06-29 MED ORDER — GADOBENATE DIMEGLUMINE 529 MG/ML IV SOLN
20.0000 mL | Freq: Once | INTRAVENOUS | Status: AC | PRN
  Administered 2014-06-29: 20 mL via INTRAVENOUS

## 2014-06-29 NOTE — ED Notes (Signed)
Patient transported to CT 

## 2014-06-29 NOTE — ED Notes (Signed)
Pt here from home with c/o dizziness last night and some htn , pt has some slight numbness to the lower lip that started this am about 330 am pt also states that she feels weak in her arms and legs

## 2014-06-29 NOTE — Discharge Instructions (Signed)
Please call your doctor for a followup appointment within 24-48 hours. When you talk to your doctor please let them know that you were seen in the emergency department and have them acquire all of your records so that they can discuss the findings with you and formulate a treatment plan to fully care for your new and ongoing problems. Please call and set-up an appointment with your primary care provider Please call and set-up an appointment with oncology Please call and set-up an appointment with Neurology regarding ongoing dizziness Please take medications as prescribed and as needed Please rest and drink plenty of water  Please take medications as prescribed-particularly blood pressure medications for this can need to stroke, heart conditions, and end organ damage Please continue to monitor symptoms closely and if symptoms are to worsen or change (fever greater than 101, chills, sweating, nausea, vomiting, chest pain, shortness of breathe, difficulty breathing, weakness, numbness, tingling, worsening or changes to pain pattern, dizziness, blurred vision, sudden loss of vision, neck pain, neck stiffness, worsening or changes to symptoms) please report back to the Emergency Department immediately.     Vertigo Vertigo means you feel like you or your surroundings are moving when they are not. Vertigo can be dangerous if it occurs when you are at work, driving, or performing difficult activities.  CAUSES  Vertigo occurs when there is a conflict of signals sent to your brain from the visual and sensory systems in your body. There are many different causes of vertigo, including:  Infections, especially in the inner ear.  A bad reaction to a drug or misuse of alcohol and medicines.  Withdrawal from drugs or alcohol.  Rapidly changing positions, such as lying down or rolling over in bed.  A migraine headache.  Decreased blood flow to the brain.  Increased pressure in the brain from a head  injury, infection, tumor, or bleeding. SYMPTOMS  You may feel as though the world is spinning around or you are falling to the ground. Because your balance is upset, vertigo can cause nausea and vomiting. You may have involuntary eye movements (nystagmus). DIAGNOSIS  Vertigo is usually diagnosed by physical exam. If the cause of your vertigo is unknown, your caregiver may perform imaging tests, such as an MRI scan (magnetic resonance imaging). TREATMENT  Most cases of vertigo resolve on their own, without treatment. Depending on the cause, your caregiver may prescribe certain medicines. If your vertigo is related to body position issues, your caregiver may recommend movements or procedures to correct the problem. In rare cases, if your vertigo is caused by certain inner ear problems, you may need surgery. HOME CARE INSTRUCTIONS   Follow your caregiver's instructions.  Avoid driving.  Avoid operating heavy machinery.  Avoid performing any tasks that would be dangerous to you or others during a vertigo episode.  Tell your caregiver if you notice that certain medicines seem to be causing your vertigo. Some of the medicines used to treat vertigo episodes can actually make them worse in some people. SEEK IMMEDIATE MEDICAL CARE IF:   Your medicines do not relieve your vertigo or are making it worse.  You develop problems with talking, walking, weakness, or using your arms, hands, or legs.  You develop severe headaches.  Your nausea or vomiting continues or gets worse.  You develop visual changes.  A family member notices behavioral changes.  Your condition gets worse. MAKE SURE YOU:  Understand these instructions.  Will watch your condition.  Will get help right away  if you are not doing well or get worse. Document Released: 07/28/2005 Document Revised: 01/10/2012 Document Reviewed: 05/06/2011 [facility] Patient Information 2015 [place], [place]. This information is not intended to  replace advice given to you by your health care provider. Make sure you discuss any questions you have with your health care provider.  Hypertension Hypertension, commonly called high blood pressure, is when the force of blood pumping through your arteries is too strong. Your arteries are the blood vessels that carry blood from your heart throughout your body. A blood pressure reading consists of a higher number over a lower number, such as 110/72. The higher number (systolic) is the pressure inside your arteries when your heart pumps. The lower number (diastolic) is the pressure inside your arteries when your heart relaxes. Ideally you want your blood pressure below 120/80. Hypertension forces your heart to work harder to pump blood. Your arteries may become narrow or stiff. Having hypertension puts you at risk for heart disease, stroke, and other problems.  RISK FACTORS Some risk factors for high blood pressure are controllable. Others are not.  Risk factors you cannot control include:   Race. You may be at higher risk if you are African American.  Age. Risk increases with age.  Gender. Men are at higher risk than women before age 52 years. After age 61, women are at higher risk than men. Risk factors you can control include:  Not getting enough exercise or physical activity.  Being overweight.  Getting too much fat, sugar, calories, or salt in your diet.  Drinking too much alcohol. SIGNS AND SYMPTOMS Hypertension does not usually cause signs or symptoms. Extremely high blood pressure (hypertensive crisis) may cause headache, anxiety, shortness of breath, and nosebleed. DIAGNOSIS  To check if you have hypertension, your health care provider will measure your blood pressure while you are seated, with your arm held at the level of your heart. It should be measured at least twice using the same arm. Certain conditions can cause a difference in blood pressure between your right and left arms.  A blood pressure reading that is higher than normal on one occasion does not mean that you need treatment. If one blood pressure reading is high, ask your health care provider about having it checked again. TREATMENT  Treating high blood pressure includes making lifestyle changes and possibly taking medicine. Living a healthy lifestyle can help lower high blood pressure. You may need to change some of your habits. Lifestyle changes may include:  Following the DASH diet. This diet is high in fruits, vegetables, and whole grains. It is low in salt, red meat, and added sugars.  Getting at least 2 hours of brisk physical activity every week.  Losing weight if necessary.  Not smoking.  Limiting alcoholic beverages.  Learning ways to reduce stress. If lifestyle changes are not enough to get your blood pressure under control, your health care provider may prescribe medicine. You may need to take more than one. Work closely with your health care provider to understand the risks and benefits. HOME CARE INSTRUCTIONS  Have your blood pressure rechecked as directed by your health care provider.   Take medicines only as directed by your health care provider. Follow the directions carefully. Blood pressure medicines must be taken as prescribed. The medicine does not work as well when you skip doses. Skipping doses also puts you at risk for problems.   Do not smoke.   Monitor your blood pressure at home as directed by  your health care provider. SEEK MEDICAL CARE IF:   You think you are having a reaction to medicines taken.  You have recurrent headaches or feel dizzy.  You have swelling in your ankles.  You have trouble with your vision. SEEK IMMEDIATE MEDICAL CARE IF:  You develop a severe headache or confusion.  You have unusual weakness, numbness, or feel faint.  You have severe chest or abdominal pain.  You vomit repeatedly.  You have trouble breathing. MAKE SURE YOU:    Understand these instructions.  Will watch your condition.  Will get help right away if you are not doing well or get worse. Document Released: 10/18/2005 Document Revised: 03/04/2014 Document Reviewed: 08/10/2013 [facility] Patient Information 2015 [place], [place]. This information is not intended to replace advice given to you by your health care provider. Make sure you discuss any questions you have with your health care provider.

## 2014-06-29 NOTE — ED Notes (Signed)
Patient transported to X-ray 

## 2014-06-29 NOTE — ED Provider Notes (Signed)
Pt seen and evaluated.  D/W M. Sciacca PA.  Pt not tachypneic, or tachycardic.  States she felt spinning in her head at 03:00 upon awakening. Also "slight" SOB, but not now.  On exam, pt with 1-2 beats horizontal nystagmus, but no current c/o vertigo.  Agree with [facility] to exclude Posterior circulation TIA/CVA, PE considering h/o Metastatic Breast ca. (Thought to be "in remission" per pt/family).  States not taking blood pressure medicine every day because "I'm old and I can always afford it". Family requests "to meet with the social worker about her medicines".  Tanna Furry, MD 06/29/14 [phone]

## 2014-06-29 NOTE — ED Notes (Signed)
Pt went from Ct to MRI

## 2014-06-29 NOTE — ED Notes (Signed)
Pt able to ambulate to bathroom with minimal help, pt is not dizzy after second dose meds

## 2014-06-29 NOTE — ED Provider Notes (Signed)
CSN: [id number]     Arrival date & time 06/29/14  7035 History   First MD Initiated Contact with Patient 06/29/14 [phone]     Chief Complaint  Patient presents with  . Hypertension  . Dizziness     (Consider location/radiation/quality/duration/timing/severity/associated sxs/prior Treatment) The history is provided by the patient. No language interpreter was used.  [name] [name] is a 73 y/o F with PMHx of asthma, HTN, breast cancer that has transferred to bone cancer of her left hip currently on cancer therapy through central line, CVA, TIA, low back pain presenting to the ED with episode of dizziness and headache that started last night at approximately 3:00AM. Patient reported that when she turned over in her sleep reported that she noticed that she had dizziness where she was unable to focus on anything in the room and felt as if the room was spinning and going approximately 100 mph - stated that she could not focus and felt extremely nauseous. Stated this episode lasted approximately 3-4 minutes. Patient reported that she has been having mild sweats and chills this morning. Stated that she has been feeling more short of breath than normal, stated that she needs to take a deep breath in after each sentence. Reported that she's noticed a light cough over the past couple of days. Reported that she has been feeling tired and lethargic for the past couple of days. Stated that she gets her medications for her cancer every 3 months and is followed by Dr. Francie Massing. Denied nausea, vomiting, fever, chest pain, difficulty breathing, abdominal pain, neck pain, neck stiffness, head injury, changes to medications.  PCP Dr. Henderson [place]  Past Medical History  Diagnosis Date  . Asthma   . Hypertension   . Cerebrovascular accident     R thalamic CVA 01/2010  . Hyperlipemia   . Dyslipidemia   . TIA (transient ischemic attack)     x2 in 2011  . Low back pain   . Osteoarthritis   . Anxiety   .  Breast cancer     2002; metastatic in 2012  . Diabetes mellitus type II     pt.states she's not diabetic   Past Surgical History  Procedure Laterality Date  . Breast lumpectomy      Right breast 2002  . Joint replacement      L hip due to breast ca met   Family History  Problem Relation Age of Onset  . Hypertension Mother   . Hypertension Father    History  Substance Use Topics  . Smoking status: Former Smoker    Quit date: 02/12/2011  . Smokeless tobacco: Not on file  . Alcohol Use: Yes   OB History   Grav Para Term Preterm Abortions TAB SAB Ect Mult Living                 Review of Systems  Constitutional: Positive for chills and fatigue. Negative for fever.  Respiratory: Positive for cough and shortness of breath. Negative for chest tightness.   Cardiovascular: Negative for chest pain.  Gastrointestinal: Positive for nausea. Negative for vomiting and abdominal pain.  Musculoskeletal: Negative for back pain and neck pain.  Neurological: Positive for dizziness and weakness (Generalized). Negative for headaches.      Allergies  Atorvastatin  Home Medications   Prior to Admission medications   Medication Sig Start Date End Date Taking? Authorizing Provider  acetaminophen (TYLENOL) 500 MG tablet Take 1,000 mg by mouth every 6 (six) hours as  needed for mild pain or moderate pain.   Yes Historical Provider, MD  amLODipine (NORVASC) 10 MG tablet Take 10 mg by mouth daily.   Yes Historical Provider, MD  anastrozole (ARIMIDEX) 1 MG tablet Take 1 tablet (1 mg total) by mouth daily. 02/26/13  Yes Curt Bears, MD  Cholecalciferol (VITAMIN D3) 1000 UNITS CAPS Take 1 capsule by mouth daily.    Yes Historical Provider, MD  losartan-hydrochlorothiazide (HYZAAR) 100-25 MG per tablet Take 1 tablet by mouth daily. 02/11/12  Yes Aleksei [name] V, MD  Naproxen Sodium (ALEVE) 220 MG CAPS Take 1-2 capsules by mouth daily as needed (for pain).    Yes Historical Provider, MD   meclizine (ANTIVERT) 12.5 MG tablet Take 1 tablet (12.5 mg total) by mouth 2 (two) times daily as needed for dizziness. 06/29/14   Naydelin Ziegler, PA-C   BP 134/61  Pulse 76  Temp(Src) 97.9 F (36.6 C) (Oral)  Resp 15  Ht 5' 3"  (1.6 m)  Wt 223 lb (101.152 kg)  BMI 39.51 kg/m2  SpO2 100% Physical Exam  Nursing note and vitals reviewed. Constitutional: She is oriented to person, place, and time. She appears well-developed and well-nourished. No distress.  HENT:  Head: Normocephalic and atraumatic.  Mouth/Throat: Oropharynx is clear and moist. No oropharyngeal exudate.  Eyes: Conjunctivae and EOM are normal. Pupils are equal, round, and reactive to light. Right eye exhibits no discharge. Left eye exhibits no discharge.  EOMs intact without pain Visual fields grossly intact Horizontal nystagmus identified  Neck: Normal range of motion. Neck supple. No tracheal deviation present.  Negative neck stiffness Negative nuchal rigidity No cervical adenopathy Negative meningeal signs  Cardiovascular: Normal rate, regular rhythm and normal heart sounds.  Exam reveals no friction rub.   No murmur heard. Pulses:      Radial pulses are 2+ on the right side, and 2+ on the left side.       Dorsalis pedis pulses are 2+ on the right side, and 2+ on the left side.  Cap refill less than 3 seconds  Pulmonary/Chest: Effort normal and breath sounds normal. No respiratory distress. She has no wheezes. She has no rales. She exhibits no tenderness.  Patient is able to speak in full sentences without difficulty-at the end of each sentence patient does take a deep breath in Negative stridor Negative use of accessory muscles  Port localized to the left side of the chest with negative swelling, erythema, inflammation, warmth upon palpation   Musculoskeletal: Normal range of motion.  Full ROM to upper and lower extremities without difficulty noted, negative ataxia noted.  Lymphadenopathy:    She has no  cervical adenopathy.  Neurological: She is alert and oriented to person, place, and time. No cranial nerve deficit. She exhibits normal muscle tone. Coordination normal.  Cranial nerves III-XII grossly intact Strength 5+/5+ to upper and lower extremities bilaterally with resistance applied, equal distribution noted Strength intact to MCP, PIP, DIP joints of bilateral hands Equal grip strength Negative facial droop Negative slurred speech Negative aphasia GCS 15 Patient follows commands appropriately Patient answers questions well Sensation intact with differentiation to sharp and dull touch  Skin: Skin is warm and dry. No rash noted. She is not diaphoretic. No erythema.  Psychiatric: She has a normal mood and affect. Her behavior is normal. Thought content normal.    ED Course  Procedures (including critical care time)  10:07 AM This provider spoke with Dr. Marcello Moores Register, radiologist regarding CT head without contrast. As per  physician recommended MRI with contrast to be performed. Discussed with physician the patient has a port-reported that patient can get MRI report and that the MRI tech will check the status of the port.  2:49 PM This provider re-assessed the patient and had a long discussion regarding labs/imaging. Patient reported that the Antivert aided in her dizziness relief.   3:06 PM Patient has not been taking her blood pressure medications as prescribed secondary to monetary issues. Case manager to come and see patient to discuss medications.   3:36 PM Case Manager at bedside assessing patient.  3:42 PM This provider was made aware that the patient ate and continued to have dizziness. This provider spoke with attending physician, Dr. Alyson Locket - recommended another dose of Meclizine to be administered. After medications patient can be discharged home.   Results for orders placed during the hospital encounter of 06/29/14  CBC WITH DIFFERENTIAL      Result Value Ref Range    WBC 5.6  4.0 - 10.5 K/uL   RBC 4.21  3.87 - 5.11 MIL/uL   Hemoglobin 12.5  12.0 - 15.0 g/dL   HCT 37.9  36.0 - 46.0 %   MCV 90.0  78.0 - 100.0 fL   MCH 29.7  26.0 - 34.0 pg   MCHC 33.0  30.0 - 36.0 g/dL   RDW 14.5  11.5 - 15.5 %   Platelets 222  150 - 400 K/uL   Neutrophils Relative % 75  43 - 77 %   Neutro Abs 4.2  1.7 - 7.7 K/uL   Lymphocytes Relative 13  12 - 46 %   Lymphs Abs 0.7  0.7 - 4.0 K/uL   Monocytes Relative 11  3 - 12 %   Monocytes Absolute 0.6  0.1 - 1.0 K/uL   Eosinophils Relative 1  0 - 5 %   Eosinophils Absolute 0.1  0.0 - 0.7 K/uL   Basophils Relative 0  0 - 1 %   Basophils Absolute 0.0  0.0 - 0.1 K/uL  COMPREHENSIVE METABOLIC PANEL      Result Value Ref Range   Sodium 139  137 - 147 mEq/L   Potassium 3.8  3.7 - 5.3 mEq/L   Chloride 105  96 - 112 mEq/L   CO2 22  19 - 32 mEq/L   Glucose, Bld 109 (*) 70 - 99 mg/dL   BUN 12  6 - 23 mg/dL   Creatinine, Ser 0.98  0.50 - 1.10 mg/dL   Calcium 8.9  8.4 - 10.5 mg/dL   Total Protein 7.2  6.0 - 8.3 g/dL   Albumin 3.1 (*) 3.5 - 5.2 g/dL   AST 14  0 - 37 U/L   ALT 8  0 - 35 U/L   Alkaline Phosphatase 95  39 - 117 U/L   Total Bilirubin 0.4  0.3 - 1.2 mg/dL   GFR calc non Af Amer 57 (*) >90 mL/min   GFR calc Af Amer 66 (*) >90 mL/min   Anion gap 12  5 - 15  URINALYSIS, ROUTINE W REFLEX MICROSCOPIC      Result Value Ref Range   Color, Urine YELLOW  YELLOW   APPearance CLEAR  CLEAR   Specific Gravity, Urine 1.024  1.005 - 1.030   pH 7.0  5.0 - 8.0   Glucose, UA NEGATIVE  NEGATIVE mg/dL   Hgb urine dipstick NEGATIVE  NEGATIVE   Bilirubin Urine NEGATIVE  NEGATIVE   Ketones, ur NEGATIVE  NEGATIVE mg/dL  Protein, ur NEGATIVE  NEGATIVE mg/dL   Urobilinogen, UA 0.2  0.0 - 1.0 mg/dL   Nitrite NEGATIVE  NEGATIVE   Leukocytes, UA MODERATE (*) NEGATIVE  TROPONIN I      Result Value Ref Range   Troponin I <0.30  <0.30 ng/mL  URINE MICROSCOPIC-ADD ON      Result Value Ref Range   Squamous Epithelial / LPF RARE  RARE    WBC, UA 7-10  <3 WBC/hpf   RBC / HPF 0-2  <3 RBC/hpf  I-STAT CG4 LACTIC ACID, ED      Result Value Ref Range   Lactic Acid, Venous 1.09  0.5 - 2.2 mmol/L     Labs Review Labs Reviewed  COMPREHENSIVE METABOLIC PANEL - Abnormal; Notable for the following:    Glucose, Bld 109 (*)    Albumin 3.1 (*)    GFR calc non Af Amer 57 (*)    GFR calc Af Amer 66 (*)    All other components within normal limits  URINALYSIS, ROUTINE W REFLEX MICROSCOPIC - Abnormal; Notable for the following:    Leukocytes, UA MODERATE (*)    All other components within normal limits  URINE CULTURE  CBC WITH DIFFERENTIAL  TROPONIN I  URINE MICROSCOPIC-ADD ON  I-STAT CG4 LACTIC ACID, ED    Imaging Review Dg Chest 2 View  06/29/2014   CLINICAL DATA:  Hypertension.  Dizziness.  EXAM: CHEST  2 VIEW  COMPARISON:  None.  FINDINGS: Cardiac silhouette is normal in size. Aorta is mildly uncoiled. No mediastinal or hilar masses. No evidence of adenopathy.  Lungs are hyperexpanded. There mild pleural parenchymal scarring is noted at the apices. No lung consolidation or edema. No pleural effusion or pneumothorax  There are changes from right breast surgery. A left anterior chest wall power Port-A-Cath has its tip in the lower superior vena cava.  Bony thorax is demineralized.  IMPRESSION: No acute cardiopulmonary disease.   Electronically Signed   By: Lajean Manes M.D.   On: 06/29/2014 09:07   Ct Head Wo Contrast  06/29/2014   CLINICAL DATA:  Dizziness.  EXAM: CT HEAD WITHOUT CONTRAST  TECHNIQUE: Contiguous axial images were obtained from the base of the skull through the vertex without intravenous contrast.  COMPARISON:  02/14/2010 .  FINDINGS: No intra-axial or extra-axial pathologic fluid or blood collection. No mass. No hydrocephalus. Lucency noted in the right mid pons axis with small infarct. Periventricular white matter changes noted most consistent with chronic small vessel ischemic change. No acute bony abnormality.  Visualized paranasal sinuses and mastoids are clear.  IMPRESSION: 1. Periventricular white matter changes noted most consistent chronic small vessel ischemic change. 2. Lucency noted in the right mid pons consistent with focal tiny infarct, age undetermined.   Electronically Signed   By: Clay   On: 06/29/2014 09:21   Ct Angio Chest Pe W/cm &/or Wo Cm  06/29/2014   CLINICAL DATA:  Hypertension. Dizziness. Short of breath and left-sided chest pain.  EXAM: CT ANGIOGRAPHY CHEST WITH CONTRAST  TECHNIQUE: Multidetector CT imaging of the chest was performed using the standard protocol during bolus administration of intravenous contrast. Multiplanar CT image reconstructions and MIPs were obtained to evaluate the vascular anatomy.  CONTRAST:  54m OMNIPAQUE IOHEXOL 350 MG/ML SOLN  COMPARISON:  Current chest radiograph.  Chest CT, 02/23/2013.  FINDINGS: Study mildly degraded by motion. This limits assessment of the lower lung segmental and subsegmental pulmonary arteries. Allowing for this, there is no evidence of a  pulmonary embolus.  Heart is mildly enlarged. There are mild coronary artery calcifications. Great vessels are normal in caliber. No mediastinal or hilar masses or adenopathy. No neck base or axillary masses or adenopathy.  Lungs show peripheral areas of reticular opacity consistent with interstitial scarring. There is minor subsegmental atelectasis at the lung bases. Minor bronchiectasis is noted in the medial lower lobes. There is no lung consolidation or edema. No pleural effusion. No pneumothorax.  Small sliding hiatal hernia.  There are degenerative changes noted throughout the visualized spine. No osteoblastic or osteolytic lesions.  Review of the MIP images confirms the above findings.  IMPRESSION: 1. No evidence of a pulmonary embolus. Study mildly degraded by motion. 2. No acute findings.   Electronically Signed   By: Lajean Manes M.D.   On: 06/29/2014 11:04   Mr Jeri Cos XQ  Contrast  06/29/2014   CLINICAL DATA:  Dizziness. Metastatic breast cancer. Severe back pain. Nystagmus.  EXAM: MRI HEAD WITHOUT AND WITH CONTRAST  TECHNIQUE: Multiplanar, multiecho pulse sequences of the brain and surrounding structures were obtained without and with intravenous contrast.  CONTRAST:  59m MULTIHANCE GADOBENATE DIMEGLUMINE 529 MG/ML IV SOLN  COMPARISON:  CT head earlier today. MR head from 02/14/2010. Also CT head from 02/14/2010.  FINDINGS: The patient was unable to remain motionless for the exam. The study is marginally diagnostic.  No acute stroke or acute hemorrhage. No mass lesion or hydrocephalus. No extra-axial fluid. Normal midline.  Generalized atrophy. T2 and FLAIR hyperintensities of a mild-to-moderate degree suggesting chronic microvascular ischemic change. Large remote lacunar infarct affect the RIGHT pons with a smaller similar lesion in the LEFT thalamus also of a chronic nature.  Post infusion, there is mild pachymeningeal enhancement affecting the dura bilaterally over the convexity of a nonspecific nature. No leptomeningeal enhancement. No intra-axial lesions are seen.  There is also an enhancing diploic space 5 x 9 mm LEFT parietal bone lesion with slight scalloping, but otherwise intact inner table of the skull. There was an osseous lesion present in this location in 2011, and I favor this is being epidermoid or dermoid lesion rather than a metastasis. No other similar osseous lesions. No acute sinus or mastoid fluid.  IMPRESSION: Motion degraded exam of marginal diagnostic utility.  No definite intra-axial mass lesion.  Mild pachymeningeal enhancement over the convexity, not clearly pathologic.  5 x 9 mm diploic LEFT parietal osseous lesion was present in 2011 but slightly larger today; favor epidermoid/dermoid spectrum rather than metastatic disease.  Atrophy with small vessel disease, progressive from 2011.   Electronically Signed   By: JRolla FlattenM.D.   On: 06/29/2014  13:18     EKG Interpretation   Date/Time:  Saturday June 29 2014 07:52:50 EDT Ventricular Rate:  73 PR Interval:  145 QRS Duration: 97 QT Interval:  403 QTC Calculation: 444 R Axis:   36 Text Interpretation:  Sinus rhythm Probable left atrial enlargement Low  voltage, precordial leads Confirmed by JJeneen Rinks MD, [place]([id number] on  06/29/2014 9:14:43 AM      MDM   Final diagnoses:  Dizziness  Peripheral vertigo, unspecified laterality    Medications  heparin lock flush 100 unit/mL (500 Units Intracatheter Given 06/29/14 1712)    And  heparin lock flush 100 unit/mL (0 Units Intracatheter Duplicate [id number]/[id number]  meclizine (ANTIVERT) tablet 25 mg (25 mg Oral Given 06/29/14 0949)  sodium chloride 0.9 % bolus 500 mL (0 mLs Intravenous Stopped 06/29/14 1441)  iohexol (OMNIPAQUE) 350 MG/ML  injection 80 mL (80 mLs Intravenous Contrast Given 06/29/14 1025)  gadobenate dimeglumine (MULTIHANCE) injection 20 mL (20 mLs Intravenous Contrast Given 06/29/14 1225)  meclizine (ANTIVERT) tablet 12.5 mg (12.5 mg Oral Given 06/29/14 1549)   Filed Vitals:   06/29/14 1345 06/29/14 1500 06/29/14 1600 06/29/14 1606  BP: 130/55 136/56 131/46 134/61  Pulse: 73 74 76 76  Temp:      TempSrc:      Resp: 19 24 20 15   Height:      Weight:      SpO2: 100% 100% 99% 100%    EKG noted sinus rhythm with heart rate 73 beats per minute. Troponin negative elevation. CBC unremarkable. CMP unremarkable. Lactic acid negative elevation. Urinalysis negative for nitrites and hemoglobin. Moderate leukocytes identified with white blood cell count is 7-10-urine culture pending. Chest x-ray no acute cardiac pulmonary disease noted. CT head without contrast noted lucency in the right mid pons consistent with focal tiny infarct age undetermined. CT angiogram of chest negative for pulmonary embolism. MRI of brain with contrast identified a 5 x 9 mm diploic left parietal osseous lesion is present since 2011 but has now become  slightly larger-favor epidermoid/dermoid spectrum rather than metastatic disease. Atrophy has been progressive from 2011. This provider spoke with radiologist, Dr. Jobe Igo, reported that this findings on the MRI of the pachymeningeal is not concerning, not acute, not leading to problems today. Recommended that patient follow up with Oncology as an outpatient.  MRI unremarkable for acute intracranial abnormalities. Negative findings for metastasis. Doubt PE. Doubt ACS. Doubt infarction. Negative signs of respiratory distress. Patient is 100% on room air. Gait proper with negative ataxia. Negative focal neurological deficits noted. Patient seen and assessed by attending physician, Dr. Alyson Locket - imaging and labs reviewed in great detail. Suspicion to be benign peripheral vertigo. Attending physician agrees that patient can be discharged home and to follow-up with PCP as an outpatient and Oncologist. Patient responded well to the [place]. Patient seen and assessed by Case Management regarding medications, patient has not been taking her medications as prescribed. Patient stable, afebrile. Patient not septic appearing. Discharged patient. Discharged patient with meclizine. Discussed with patient importance of taking medications and for patient to continue to follow and monitor blood pressure - discussed consequences of medical noncompliance. Discussed with patient to rest and stay hydrated. Discussed with patient to closely monitor symptoms and if symptoms are to worsen or change to report back to the ED - strict return instructions given.  Patient agreed to plan of care, understood, all questions answered.   Jamse Mead, PA-C 06/29/14 [place], PA-C 06/29/14 1841

## 2014-06-29 NOTE — ED Notes (Signed)
Returned from MRI 

## 2014-07-01 ENCOUNTER — Telehealth: Payer: Self-pay | Admitting: *Deleted

## 2014-07-01 ENCOUNTER — Telehealth: Payer: Self-pay | Admitting: Internal Medicine

## 2014-07-01 LAB — URINE CULTURE: Colony Count: 30000

## 2014-07-01 NOTE — Telephone Encounter (Signed)
returned pt call to sched appt....pt advised that she s.w desk nurse and per Dr MM she did not need to be seen at this time...no appts made

## 2014-07-01 NOTE — Telephone Encounter (Signed)
Pt called stating she was in the ED and they wanted Dr Vista Mink to look at her scans she had done "because there was something they did not like".  Dr Vista Mink reviewed her scans, per Dr Vista Mink, nothing new at this time, keep appts as scheduled.  Pt verbalized understanding.  SLJ

## 2014-07-04 NOTE — ED Provider Notes (Signed)
Medical screening examination/treatment/procedure(s) were performed by non-physician practitioner and as supervising physician I was immediately available for consultation/collaboration.   EKG Interpretation   Date/Time:  Saturday June 29 2014 07:52:50 EDT Ventricular Rate:  73 PR Interval:  145 QRS Duration: 97 QT Interval:  403 QTC Calculation: 444 R Axis:   36 Text Interpretation:  Sinus rhythm Probable left atrial enlargement Low  voltage, precordial leads Confirmed by Jeneen Rinks  MD, [place] ([id number]) on  06/29/2014 9:14:43 AM        Tanna Furry, MD 07/04/14 2139

## 2014-07-12 ENCOUNTER — Ambulatory Visit (INDEPENDENT_AMBULATORY_CARE_PROVIDER_SITE_OTHER): Payer: Medicare Other | Admitting: Internal Medicine

## 2014-07-12 DIAGNOSIS — Z23 Encounter for immunization: Secondary | ICD-10-CM

## 2014-07-12 DIAGNOSIS — C50911 Malignant neoplasm of unspecified site of right female breast: Secondary | ICD-10-CM

## 2014-07-12 DIAGNOSIS — H8112 Benign paroxysmal vertigo, left ear: Secondary | ICD-10-CM

## 2014-07-12 DIAGNOSIS — C50919 Malignant neoplasm of unspecified site of unspecified female breast: Secondary | ICD-10-CM

## 2014-07-12 DIAGNOSIS — C7951 Secondary malignant neoplasm of bone: Secondary | ICD-10-CM

## 2014-07-12 DIAGNOSIS — H8110 Benign paroxysmal vertigo, unspecified ear: Secondary | ICD-10-CM

## 2014-07-12 DIAGNOSIS — I10 Essential (primary) hypertension: Secondary | ICD-10-CM | POA: Diagnosis not present

## 2014-07-12 DIAGNOSIS — C7952 Secondary malignant neoplasm of bone marrow: Secondary | ICD-10-CM

## 2014-07-12 MED ORDER — LOSARTAN POTASSIUM-HCTZ 100-25 MG PO TABS: 1.0000 | ORAL_TABLET | Freq: Every day | ORAL | Status: DC

## 2014-07-12 MED ORDER — AMLODIPINE BESYLATE 10 MG PO TABS: 10.0000 mg | ORAL_TABLET | Freq: Every day | ORAL | Status: DC

## 2014-07-12 MED ORDER — ANASTROZOLE 1 MG PO TABS: 1.0000 mg | ORAL_TABLET | Freq: Every day | ORAL | Status: DC

## 2014-07-12 NOTE — Patient Instructions (Signed)
Benign Positional Vertigo symptoms on the left. Start Meclizine. Start Brandt - Daroff exercise several times a day as dirrected. 

## 2014-07-12 NOTE — Progress Notes (Signed)
Subjective:    HPI  Hx 06/29/14 ER note: [name] [name] is a 73 y/o F with PMHx of asthma, HTN, breast cancer that has transferred to bone cancer of her left hip currently on cancer therapy through central line, CVA, TIA, low back pain presenting to the ED with episode of dizziness and headache that started last night at approximately 3:00AM. Patient reported that when she turned over in her sleep reported that she noticed that she had dizziness where she was unable to focus on anything in the room and felt as if the room was spinning and going approximately 100 mph - stated that she could not focus and felt extremely nauseous. Stated this episode lasted approximately 3-4 minutes. Patient reported that she has been having mild sweats and chills this morning. Stated that she has been feeling more short of breath than normal, stated that she needs to take a deep breath in after each sentence. Reported that she's noticed a light cough over the past couple of days. Reported that she has been feeling tired and lethargic for the past couple of days. Stated that she gets her medications for her cancer every 3 months and is followed by Dr. Francie Massing.  Feeling better. No HA  The patient presents for a follow-up of  chronic hypertension, chronic dyslipidemia, type 2 diabetes controlled with medicines C/o R knee pain x chronic F/u breast cancer w/mets F/u GERD- not  using Prilosec - can't swallow it She had a flu shot in the past 2 years w/o complications  BP Readings from Last 3 Encounters:  06/29/14 134/61  05/20/14 145/79  02/25/14 181/68   Wt Readings from Last 3 Encounters:  06/29/14 223 lb (101.152 kg)  05/20/14 226 lb 8 oz (102.74 kg)  02/25/14 232 lb 1.6 oz (105.28 kg)      Review of Systems  Constitutional: Negative for chills, activity change, appetite change, fatigue and unexpected weight change.  HENT: Negative for congestion, mouth sores and sinus pressure.   Eyes:  Negative for visual disturbance.  Respiratory: Negative for cough and chest tightness.   Cardiovascular: Negative for palpitations.  Gastrointestinal: Negative for nausea.  Genitourinary: Negative for frequency, difficulty urinating and vaginal pain.  Musculoskeletal: Positive for arthralgias and gait problem. Negative for back pain.  Skin: Negative for pallor.  Neurological: Negative for dizziness, tremors, weakness and numbness.  Psychiatric/Behavioral: Negative for confusion and sleep disturbance.  no black stools      Objective:   Physical Exam  Constitutional: She appears well-developed. No distress.  obese  HENT:  Head: Normocephalic.  Right Ear: External ear normal.  Left Ear: External ear normal.  Nose: Nose normal.  Mouth/Throat: Oropharynx is clear and moist.  Eyes: Conjunctivae are normal. Pupils are equal, round, and reactive to light. Right eye exhibits no discharge. Left eye exhibits no discharge.  Neck: Normal range of motion. Neck supple. No JVD present. No tracheal deviation present. No thyromegaly present.  Cardiovascular: Normal rate, regular rhythm and normal heart sounds.   Pulmonary/Chest: No stridor. No respiratory distress. She has no wheezes.  Abdominal: Soft. Bowel sounds are normal. She exhibits no distension and no mass. There is no tenderness. There is no rebound and no guarding.  Musculoskeletal: She exhibits tenderness. She exhibits no edema.  R knee is tender  Lymphadenopathy:    She has no cervical adenopathy.  Neurological: She displays normal reflexes. No cranial nerve deficit. She exhibits normal muscle tone. Coordination normal.  cane  Skin: No  rash noted. No erythema.  Psychiatric: She has a normal mood and affect. Her behavior is normal. Judgment and thought content normal.  No palor H-P (+) on L   Lab Results  Component Value Date   WBC 5.7 02/02/2012   HGB 12.2 02/02/2012   HCT 37.5 02/02/2012   PLT 239 02/02/2012   GLUCOSE 109* 02/02/2012    CHOL  Value: 187        ATP III CLASSIFICATION:  <200     mg/dL   Desirable  200-239  mg/dL   Borderline High  >=240    mg/dL   High        02/14/2010   TRIG 61 02/14/2010   HDL 52 02/14/2010   LDLCALC  Value: 123        Total Cholesterol/HDL:CHD Risk Coronary Heart Disease Risk Table                     Men   Women  1/2 Average Risk   3.4   3.3  Average Risk       5.0   4.4  2 X Average Risk   9.6   7.1  3 X Average Risk  23.4   11.0        Use the calculated Patient Ratio above and the CHD Risk Table to determine the patient's CHD Risk.        ATP III CLASSIFICATION (LDL):  <100     mg/dL   Optimal  100-129  mg/dL   Near or Above                    Optimal  130-159  mg/dL   Borderline  160-189  mg/dL   High  >190     mg/dL   Very High* 02/14/2010   ALT 10 02/02/2012   AST 14 02/02/2012   NA 137 02/02/2012   K 4.0 02/02/2012   CL 101 02/02/2012   CREATININE 1.16* 02/02/2012   BUN 17 02/02/2012   CO2 21 02/02/2012   TSH 1.916 **Test methodology is 3rd generation TSH** 02/15/2010   INR 1.10 04/05/2011   HGBA1C  Value: 6.4 (NOTE)                                                                       According to the ADA Clinical Practice Recommendations for 2011, when HbA1c is used as a screening test:   >=6.5%   Diagnostic of Diabetes Mellitus           (if abnormal result  is confirmed)  5.7-6.4%   Increased risk of developing Diabetes Mellitus  References:Diagnosis and Classification of Diabetes Mellitus,Diabetes [id number] 1):S62-S69 and Standards of Medical Care in         Diabetes - 2011,Diabetes Care,2011,34  (Suppl 1):S11-S61.* 02/15/2010   06/29/14 brain MRI IMPRESSION:  Motion degraded exam of marginal diagnostic utility.  No definite intra-axial mass lesion.  Mild pachymeningeal enhancement over the convexity, not clearly  pathologic.  5 x 9 mm diploic LEFT parietal osseous lesion was present in 2011  but slightly larger today; favor epidermoid/dermoid spectrum rather  than metastatic disease.   Atrophy with small vessel disease, progressive from 2011.  Electronically Signed  By: Rolla Flatten M.D.  On: 06/29/2014 13:18        Assessment & Plan:

## 2014-07-12 NOTE — Assessment & Plan Note (Signed)
?  BPV  06/29/14 brain MRI IMPRESSION:  Motion degraded exam of marginal diagnostic utility.  No definite intra-axial mass lesion.  Mild pachymeningeal enhancement over the convexity, not clearly  pathologic.  5 x 9 mm diploic LEFT parietal osseous lesion was present in 2011  but slightly larger today; favor epidermoid/dermoid spectrum rather  than metastatic disease.  Atrophy with small vessel disease, progressive from 2011.  Electronically Signed  By: Rolla Flatten M.D.  On: 06/29/2014 13:18

## 2014-07-13 ENCOUNTER — Encounter: Payer: Self-pay | Admitting: Internal Medicine

## 2014-07-13 NOTE — Assessment & Plan Note (Signed)
Continue with current prescription therapy as reflected on the Med list.  

## 2014-08-05 ENCOUNTER — Other Ambulatory Visit (HOSPITAL_BASED_OUTPATIENT_CLINIC_OR_DEPARTMENT_OTHER): Payer: Medicare Other

## 2014-08-05 DIAGNOSIS — C50911 Malignant neoplasm of unspecified site of right female breast: Secondary | ICD-10-CM

## 2014-08-05 DIAGNOSIS — C773 Secondary and unspecified malignant neoplasm of axilla and upper limb lymph nodes: Secondary | ICD-10-CM

## 2014-08-05 DIAGNOSIS — C50919 Malignant neoplasm of unspecified site of unspecified female breast: Secondary | ICD-10-CM | POA: Diagnosis not present

## 2014-08-05 DIAGNOSIS — C7951 Secondary malignant neoplasm of bone: Secondary | ICD-10-CM | POA: Diagnosis not present

## 2014-08-05 LAB — COMPREHENSIVE METABOLIC PANEL (CC13)
ALT: 10 U/L (ref 0–55)
ANION GAP: 13 meq/L — AB (ref 3–11)
AST: 12 U/L (ref 5–34)
Albumin: 3.4 g/dL — ABNORMAL LOW (ref 3.5–5.0)
Alkaline Phosphatase: 98 U/L (ref 40–150)
BUN: 19.7 mg/dL (ref 7.0–26.0)
CHLORIDE: 106 meq/L (ref 98–109)
CO2: 25 meq/L (ref 22–29)
CREATININE: 1.7 mg/dL — AB (ref 0.6–1.1)
Calcium: 10 mg/dL (ref 8.4–10.4)
Glucose: 104 mg/dl (ref 70–140)
Potassium: 3.9 mEq/L (ref 3.5–5.1)
SODIUM: 143 meq/L (ref 136–145)
TOTAL PROTEIN: 8.2 g/dL (ref 6.4–8.3)
Total Bilirubin: 0.39 mg/dL (ref 0.20–1.20)

## 2014-08-05 LAB — CBC WITH DIFFERENTIAL/PLATELET
BASO%: 0.3 % (ref 0.0–2.0)
Basophils Absolute: 0 10*3/uL (ref 0.0–0.1)
EOS%: 2.7 % (ref 0.0–7.0)
Eosinophils Absolute: 0.2 10*3/uL (ref 0.0–0.5)
HCT: 39.2 % (ref 34.8–46.6)
HGB: 12.5 g/dL (ref 11.6–15.9)
LYMPH#: 1.1 10*3/uL (ref 0.9–3.3)
LYMPH%: 17.2 % (ref 14.0–49.7)
MCH: 29 pg (ref 25.1–34.0)
MCHC: 31.9 g/dL (ref 31.5–36.0)
MCV: 91 fL (ref 79.5–101.0)
MONO#: 0.7 10*3/uL (ref 0.1–0.9)
MONO%: 11.1 % (ref 0.0–14.0)
NEUT#: 4.3 10*3/uL (ref 1.5–6.5)
NEUT%: 68.7 % (ref 38.4–76.8)
Platelets: 286 10*3/uL (ref 145–400)
RBC: 4.31 10*6/uL (ref 3.70–5.45)
RDW: 14.6 % — ABNORMAL HIGH (ref 11.2–14.5)
WBC: 6.3 10*3/uL (ref 3.9–10.3)

## 2014-08-05 LAB — CANCER ANTIGEN 27.29: CA 27.29: 75 U/mL — ABNORMAL HIGH (ref 0–39)

## 2014-08-12 ENCOUNTER — Ambulatory Visit: Payer: Medicare Other

## 2014-08-12 ENCOUNTER — Telehealth: Payer: Self-pay | Admitting: Internal Medicine

## 2014-08-12 ENCOUNTER — Ambulatory Visit (HOSPITAL_BASED_OUTPATIENT_CLINIC_OR_DEPARTMENT_OTHER): Payer: Medicare Other | Admitting: Internal Medicine

## 2014-08-12 ENCOUNTER — Encounter: Payer: Self-pay | Admitting: Internal Medicine

## 2014-08-12 VITALS — BP 113/44 | HR 80 | Temp 97.8°F | Resp 18 | Ht 63.0 in | Wt 227.3 lb

## 2014-08-12 DIAGNOSIS — C7951 Secondary malignant neoplasm of bone: Secondary | ICD-10-CM

## 2014-08-12 DIAGNOSIS — Z170 Estrogen receptor positive status [ER+]: Secondary | ICD-10-CM

## 2014-08-12 DIAGNOSIS — C50911 Malignant neoplasm of unspecified site of right female breast: Secondary | ICD-10-CM

## 2014-08-12 MED ORDER — DENOSUMAB 120 MG/1.7ML ~~LOC~~ SOLN: 120.0000 mg | Freq: Once | SUBCUTANEOUS | Status: DC

## 2014-08-12 NOTE — Progress Notes (Signed)
[facility] [phone]   [phone]  OFFICE PROGRESS NOTE  Walker Kehr, MD [place] [address]  DIAGNOSIS AND STAGE: Metastatic breast adenocarcinoma, presenting with bone metastasis in the left hip area. This was initially diagnosed as stage II (T2, N1, M0) invasive right breast carcinoma with positive estrogen and progesterone receptors and HER-2/neu positive in July 2002   PRIOR THERAPY:  1. Status post right lumpectomy with right axillary lymph node dissection revealing 4/9 lymph nodes that were positive for malignancy.  2. Status post 8 cycles of systemic chemotherapy with CMF, completed March 2003.  3. Status post radiotherapy to the remaining right breast under the care of Dr. Tammi Klippel.  4. Status post 2 years of treatment with tamoxifen, started July 2003, then switched to Femara by Dr. Humphrey Rolls in August 2005. The patient discontinued the treatment in February 2009.  5. Status post left total hip replacement on Apr 01, 2011, secondary to pathologic left femoral neck fracture. 6. Zometa 4 mg IV on 3 monthly basis. Discontinued secondary to renal insufficiency.   CURRENT THERAPY:  1. Arimidex 1 mg p.o. daily, started 03/18/2011.  2. Xgeva 120 mcg subcutaneously on 3 monthly basis.  CHEMOTHERAPY INTENT: Palliative  CURRENT # OF HORMONAL THERAPY CYCLES: 42 CURRENT ANTIEMETICS: None  CURRENT SMOKING STATUS: Nonsmoker  ORAL HORMONAL THERAPY AND CONSENT: Yes.  CURRENT BISPHOSPHONATES USE: Zometa with stable renal function.  PAIN MANAGEMENT: Tramadol when necessary  NARCOTICS INDUCED CONSTIPATION: None  LIVING WILL AND CODE STATUS: Full code.   INTERVAL HISTORY:  [name] [name] 73 y.o. female returns to the clinic today for routine three-month followup visit. The patient is feeling fine today with no specific complaints except for the persistent pain at the right knee. She has no significant weight loss or night sweats. She  has no nausea or vomiting. The patient denied having any significant chest pain, but has shortness of breath with exertion with no cough or hemoptysis. She is tolerating her treatment with Arimidex fairly well. Repeat bloodwork performed recently and she is here for evaluation and discussion of her lab results.   MEDICAL HISTORY: Past Medical History  Diagnosis Date  . Asthma   . Hypertension   . Cerebrovascular accident     R thalamic CVA 01/2010  . Hyperlipemia   . Dyslipidemia   . TIA (transient ischemic attack)     x2 in 2011  . Low back pain   . Osteoarthritis   . Anxiety   . Breast cancer     2002; metastatic in 2012  . Diabetes mellitus type II     pt.states she's not diabetic    ALLERGIES:  is allergic to atorvastatin.  MEDICATIONS:  Current Outpatient Prescriptions  Medication Sig Dispense Refill  . acetaminophen (TYLENOL) 500 MG tablet Take 1,000 mg by mouth every 6 (six) hours as needed for mild pain or moderate pain.      Marland Kitchen amLODipine (NORVASC) 10 MG tablet Take 1 tablet (10 mg total) by mouth daily.  90 tablet  3  . anastrozole (ARIMIDEX) 1 MG tablet Take 1 tablet (1 mg total) by mouth daily.  90 tablet  1  . Cholecalciferol (VITAMIN D3) 1000 UNITS CAPS Take 1 capsule by mouth daily.       Marland Kitchen losartan-hydrochlorothiazide (HYZAAR) 100-25 MG per tablet Take 1 tablet by mouth daily.  90 tablet  3  . meclizine (ANTIVERT) 12.5 MG tablet Take 1 tablet (12.5 mg  total) by mouth 2 (two) times daily as needed for dizziness.  10 tablet  0  . Naproxen Sodium (ALEVE) 220 MG CAPS Take 1-2 capsules by mouth daily as needed (for pain).        No current facility-administered medications for this visit.    SURGICAL HISTORY:  Past Surgical History  Procedure Laterality Date  . Breast lumpectomy      Right breast 2002  . Joint replacement      L hip due to breast ca met    REVIEW OF SYSTEMS:  A comprehensive review of systems was negative except for: Constitutional: positive  for fatigue Musculoskeletal: positive for arthralgias   PHYSICAL EXAMINATION: General appearance: alert, cooperative, fatigued and no distress Head: Normocephalic, without obvious abnormality, atraumatic Neck: no adenopathy, no JVD, supple, symmetrical, trachea midline and thyroid not enlarged, symmetric, no tenderness/mass/nodules Lymph nodes: Cervical, supraclavicular, and axillary nodes normal. Resp: clear to auscultation bilaterally Back: symmetric, no curvature. ROM normal. No CVA tenderness. Cardio: regular rate and rhythm, S1, S2 normal, no murmur, click, rub or gallop GI: soft, non-tender; bowel sounds normal; no masses,  no organomegaly Extremities: extremities normal, atraumatic, no cyanosis or edema  ECOG PERFORMANCE STATUS: 1 - Symptomatic but completely ambulatory  Blood pressure 113/44, pulse 80, temperature 97.8 F (36.6 C), temperature source Oral, resp. rate 18, height 5' 3"  (1.6 m), weight 227 lb 4.8 oz (103.103 kg).  LABORATORY DATA: Lab Results  Component Value Date   WBC 6.3 08/05/2014   HGB 12.5 08/05/2014   HCT 39.2 08/05/2014   MCV 91.0 08/05/2014   PLT 286 08/05/2014      Chemistry      Component Value Date/Time   NA 143 08/05/2014 0828   NA 139 06/29/2014 0835   NA 140 05/03/2012 1449   K 3.9 08/05/2014 0828   K 3.8 06/29/2014 0835   K 4.3 05/03/2012 1449   CL 105 06/29/2014 0835   CL 109* 04/25/2013 1023   CL 99 05/03/2012 1449   CO2 25 08/05/2014 0828   CO2 22 06/29/2014 0835   CO2 27 05/03/2012 1449   BUN 19.7 08/05/2014 0828   BUN 12 06/29/2014 0835   BUN 14 05/03/2012 1449   CREATININE 1.7* 08/05/2014 0828   CREATININE 0.98 06/29/2014 0835   CREATININE 1.1 05/03/2012 1449      Component Value Date/Time   CALCIUM 10.0 08/05/2014 0828   CALCIUM 8.9 06/29/2014 0835   CALCIUM 9.1 05/03/2012 1449   ALKPHOS 98 08/05/2014 0828   ALKPHOS 95 06/29/2014 0835   ALKPHOS 78 05/03/2012 1449   AST 12 08/05/2014 0828   AST 14 06/29/2014 0835   AST 19 05/03/2012 1449   ALT 10  08/05/2014 0828   ALT 8 06/29/2014 0835   ALT 14 05/03/2012 1449   BILITOT 0.39 08/05/2014 0828   BILITOT 0.4 06/29/2014 0835   BILITOT 0.50 05/03/2012 1449     CA 27.29: 75  RADIOGRAPHIC STUDIES:  ASSESSMENT AND PLAN: This is a very pleasant 73 years old African American female with metastatic breast adenocarcinoma with bone metastasis. The patient is currently on hormonal therapy with Arimidex with no significant evidence for disease progression.  Her recent scan showed no evidence for disease progression, but the tumor marker CA 27.29 continues to increase and currently 75. I discussed the scan and lab results with the patient. I recommended for the patient to continue her current treatment with Arimidex but I will order CT scan of the chest, abdomen and  pelvis as well as bone scan for restaging of her disease especially with the rising CA 27.29. The patient has been on treatment with Zometa 4 mg IV every 3 months but her serum creatinine is elevated, most likely secondary to increased use of ibuprofen and dehydration.  I recommended for her to discontinue her treatment with ibuprofen and encouraged by mouth intake. I would switch Zometa to Xgeva 120 mcg subcutaneously every 3 months because of the renal insufficiency.She was also advised to take calcium and vitamin D supplement. I also strongly encouraged the patient to lose weight to help with her arthritis.  She was advised to call immediately if she has any concerning symptoms in the interval. The patient voices understanding of current disease status and treatment options and is in agreement with the current care plan.  All questions were answered. The patient knows to call the clinic with any problems, questions or concerns. We can certainly see the patient much sooner if necessary.  Disclaimer: This note was dictated with voice recognition software. Similar sounding words can inadvertently be transcribed and may not be corrected upon  review.

## 2014-08-12 NOTE — Telephone Encounter (Signed)
Pt confirmed labs/ov per 10/12 POF, gave pt AVS.... KJ °

## 2014-08-16 ENCOUNTER — Encounter: Payer: Self-pay | Admitting: Internal Medicine

## 2014-08-16 ENCOUNTER — Ambulatory Visit (HOSPITAL_BASED_OUTPATIENT_CLINIC_OR_DEPARTMENT_OTHER): Payer: Medicare Other

## 2014-08-16 ENCOUNTER — Ambulatory Visit (INDEPENDENT_AMBULATORY_CARE_PROVIDER_SITE_OTHER): Payer: Medicare Other | Admitting: Internal Medicine

## 2014-08-16 VITALS — BP 183/74 | HR 78 | Temp 98.2°F | Resp 18

## 2014-08-16 VITALS — BP 140/78 | HR 88 | Temp 98.4°F | Wt 228.0 lb

## 2014-08-16 DIAGNOSIS — C7951 Secondary malignant neoplasm of bone: Secondary | ICD-10-CM

## 2014-08-16 DIAGNOSIS — I10 Essential (primary) hypertension: Secondary | ICD-10-CM

## 2014-08-16 DIAGNOSIS — M25561 Pain in right knee: Secondary | ICD-10-CM | POA: Diagnosis not present

## 2014-08-16 DIAGNOSIS — C50911 Malignant neoplasm of unspecified site of right female breast: Secondary | ICD-10-CM

## 2014-08-16 DIAGNOSIS — J4520 Mild intermittent asthma, uncomplicated: Secondary | ICD-10-CM

## 2014-08-16 DIAGNOSIS — R609 Edema, unspecified: Secondary | ICD-10-CM | POA: Diagnosis not present

## 2014-08-16 MED ORDER — OMEPRAZOLE 40 MG PO CPDR: 40.0000 mg | DELAYED_RELEASE_CAPSULE | Freq: Every day | ORAL | Status: DC

## 2014-08-16 MED ORDER — DENOSUMAB 120 MG/1.7ML ~~LOC~~ SOLN
120.0000 mg | Freq: Once | SUBCUTANEOUS | Status: AC
  Administered 2014-08-16: 120 mg via SUBCUTANEOUS
  Filled 2014-08-16: qty 1.7

## 2014-08-16 MED ORDER — FLUTICASONE-SALMETEROL 100-50 MCG/DOSE IN AEPB: 1.0000 | INHALATION_SPRAY | Freq: Two times a day (BID) | RESPIRATORY_TRACT | Status: DC

## 2014-08-16 NOTE — Assessment & Plan Note (Signed)
R -- OA vs other Dr Tamala Julian

## 2014-08-16 NOTE — Assessment & Plan Note (Signed)
Advair bid

## 2014-08-16 NOTE — Assessment & Plan Note (Signed)
Use Amlodipine 5 mg a day if low BP

## 2014-08-16 NOTE — Progress Notes (Signed)
Pre visit review using our clinic review tool, if applicable. No additional management support is needed unless otherwise documented below in the visit note. 

## 2014-08-16 NOTE — Assessment & Plan Note (Signed)
Low BP Use Amlodipine 5 mg a day if low BP

## 2014-08-16 NOTE — Progress Notes (Signed)
Subjective:    HPI  The patient presents for a follow-up of  chronic hypertension, chronic dyslipidemia, type 2 diabetes controlled with medicines C/o R knee pain x chronic - better F/u breast cancer w/mets F/u GERD- not  using Prilosec - can't swallow it She had a flu shot in the past 2 years w/o complications  BP Readings from Last 3 Encounters:  08/16/14 140/78  08/12/14 113/44  06/29/14 134/61   Wt Readings from Last 3 Encounters:  08/16/14 228 lb (103.42 kg)  08/12/14 227 lb 4.8 oz (103.103 kg)  06/29/14 223 lb (101.152 kg)      Review of Systems  Constitutional: Negative for chills, activity change, appetite change, fatigue and unexpected weight change.  HENT: Negative for congestion, mouth sores and sinus pressure.   Eyes: Negative for visual disturbance.  Respiratory: Negative for cough and chest tightness.   Cardiovascular: Negative for palpitations.  Gastrointestinal: Negative for nausea.  Genitourinary: Negative for frequency, difficulty urinating and vaginal pain.  Musculoskeletal: Positive for arthralgias and gait problem. Negative for back pain.  Skin: Negative for pallor.  Neurological: Negative for dizziness, tremors, weakness and numbness.  Psychiatric/Behavioral: Negative for confusion and sleep disturbance.  no black stools      Objective:   Physical Exam  Constitutional: She appears well-developed. No distress.  obese  HENT:  Head: Normocephalic.  Right Ear: External ear normal.  Left Ear: External ear normal.  Nose: Nose normal.  Mouth/Throat: Oropharynx is clear and moist.  Eyes: Conjunctivae are normal. Pupils are equal, round, and reactive to light. Right eye exhibits no discharge. Left eye exhibits no discharge.  Neck: Normal range of motion. Neck supple. No JVD present. No tracheal deviation present. No thyromegaly present.  Cardiovascular: Normal rate, regular rhythm and normal heart sounds.   Pulmonary/Chest: No stridor. No  respiratory distress. She has no wheezes.  Abdominal: Soft. Bowel sounds are normal. She exhibits no distension and no mass. There is no tenderness. There is no rebound and no guarding.  Musculoskeletal: She exhibits tenderness. She exhibits no edema.  R knee is tender  Lymphadenopathy:    She has no cervical adenopathy.  Neurological: She displays normal reflexes. No cranial nerve deficit. She exhibits normal muscle tone. Coordination normal.  cane  Skin: No rash noted. No erythema.  Psychiatric: She has a normal mood and affect. Her behavior is normal. Judgment and thought content normal.  No palor   Lab Results  Component Value Date   WBC 5.7 02/02/2012   HGB 12.2 02/02/2012   HCT 37.5 02/02/2012   PLT 239 02/02/2012   GLUCOSE 109* 02/02/2012   CHOL  Value: 187        ATP III CLASSIFICATION:  <200     mg/dL   Desirable  200-239  mg/dL   Borderline High  >=240    mg/dL   High        02/14/2010   TRIG 61 02/14/2010   HDL 52 02/14/2010   LDLCALC  Value: 123        Total Cholesterol/HDL:CHD Risk Coronary Heart Disease Risk Table                     Men   Women  1/2 Average Risk   3.4   3.3  Average Risk       5.0   4.4  2 X Average Risk   9.6   7.1  3 X Average Risk  23.4   11.0  Use the calculated Patient Ratio above and the CHD Risk Table to determine the patient's CHD Risk.        ATP III CLASSIFICATION (LDL):  <100     mg/dL   Optimal  100-129  mg/dL   Near or Above                    Optimal  130-159  mg/dL   Borderline  160-189  mg/dL   High  >190     mg/dL   Very High* 02/14/2010   ALT 10 02/02/2012   AST 14 02/02/2012   NA 137 02/02/2012   K 4.0 02/02/2012   CL 101 02/02/2012   CREATININE 1.16* 02/02/2012   BUN 17 02/02/2012   CO2 21 02/02/2012   TSH 1.916 **Test methodology is 3rd generation TSH** 02/15/2010   INR 1.10 04/05/2011   HGBA1C  Value: 6.4 (NOTE)                                                                       According to the ADA Clinical Practice Recommendations for 2011, when HbA1c  is used as a screening test:   >=6.5%   Diagnostic of Diabetes Mellitus           (if abnormal result  is confirmed)  5.7-6.4%   Increased risk of developing Diabetes Mellitus  References:Diagnosis and Classification of Diabetes Mellitus,Diabetes [id number] 1):S62-S69 and Standards of Medical Care in         Diabetes - 2011,Diabetes Care,2011,34  (Suppl 1):S11-S61.* 02/15/2010        Assessment & Plan:

## 2014-08-19 ENCOUNTER — Telehealth: Payer: Self-pay | Admitting: Internal Medicine

## 2014-08-19 NOTE — Telephone Encounter (Signed)
emmi mailed  °

## 2014-08-27 ENCOUNTER — Other Ambulatory Visit (INDEPENDENT_AMBULATORY_CARE_PROVIDER_SITE_OTHER): Payer: Medicare Other

## 2014-08-27 ENCOUNTER — Encounter: Payer: Self-pay | Admitting: Family Medicine

## 2014-08-27 ENCOUNTER — Ambulatory Visit (INDEPENDENT_AMBULATORY_CARE_PROVIDER_SITE_OTHER): Payer: Medicare Other | Admitting: Family Medicine

## 2014-08-27 VITALS — BP 134/78 | HR 120 | Ht 63.0 in | Wt 224.0 lb

## 2014-08-27 DIAGNOSIS — M25561 Pain in right knee: Secondary | ICD-10-CM

## 2014-08-27 DIAGNOSIS — M23000 Cystic meniscus, unspecified lateral meniscus, right knee: Secondary | ICD-10-CM

## 2014-08-27 DIAGNOSIS — M23002 Cystic meniscus, unspecified lateral meniscus, unspecified knee: Secondary | ICD-10-CM | POA: Insufficient documentation

## 2014-08-27 NOTE — Progress Notes (Signed)
[name] [place], [place] [address] Phone: [phone] Subjective:    I'm seeing this patient by the request  of:  .pcp   CC: Knee pain, right  [id number] [name] [name] is a 73 y.o. female coming in with complaint of right knee pain. Patient has had this pain intermittently for greater than one year. Patient states that the frequency and duration of pain has been increasing. Patient does have a past medical history significant for breast cancer with metastasis of the bone causing a hip replacement in the past. Patient has had 2 rounds of radiation as well as chemotherapy medications. Patient states that the pain in her knee seems to be intermittent on the lateral aspect. Patient points over the proximal tibia. Patient states it's right over the bone. Patient states that the pain can happen at any time and does wake her up at night. Pain does seem to be worse at night and not as much with activity. Denies any radiation of pain and seems to be localized. Rates the severity of 8 out of 10 when it occurs. Over-the-counter medications are not beneficial. Patient previously did have x-rays in June 2014 were reviewed by me and only show minimal osteoarthritic changes. No bony deformity noted. Patient has follow-up with oncologist and she is scheduled to have a bone scan secondary to elevation in her cancer marker recently.     Past medical history, social, surgical and family history all reviewed in electronic medical record.   Review of Systems: No headache, visual changes, nausea, vomiting, diarrhea, constipation, dizziness, abdominal pain, skin rash, fevers, chills, night sweats, weight loss, swollen lymph nodes, body aches, joint swelling, muscle aches, chest pain, shortness of breath, mood changes.   Objective Blood pressure 134/78, height 5\' 3"  (1.6 m), weight 224 lb (101.606 kg).  General: No apparent distress alert and oriented x3 mood and  affect normal, dressed appropriately. Obese HEENT: Pupils equal, extraocular movements intact  Respiratory: Patient's speak in full sentences and does not appear short of breath  Cardiovascular: No lower extremity edema, non tender, no erythema  Skin: Warm dry intact with no signs of infection or rash on extremities or on axial skeleton.  Abdomen: Soft nontender  Neuro: Cranial nerves II through XII are intact, neurovascularly intact in all extremities with 2+ DTRs and 2+ pulses.  Lymph: No lymphadenopathy of posterior or anterior cervical chain or axillae bilaterally.  Gait antalgic gait walking with a cane MSK:  Non tender with full range of motion and good stability and symmetric strength and tone of shoulders, elbows, wrist, hip, and ankles bilaterally.  Knee: Right Inspection reveals that patient's anterior tibial area on the lateral aspect does have a bony enlargement compared to the contralateral side that is nontender on exam today.. Palpation normal with no warmth, joint line tenderness, patellar tenderness, or condyle tenderness. ROM full in flexion and extension and lower leg rotation. Ligaments with solid consistent endpoints including ACL, PCL, LCL, MCL. Mildly positive Mcmurray's, Apley's, and Thessalonian tests. Non painful patellar compression. Patellar glide without crepitus. Patellar and quadriceps tendons unremarkable. Hamstring and quadriceps strength is normal.  Contralateral knee unremarkable.  MSK US performed of: Right knee This study was ordered, performed, and interpreted by Charlann Boxer D.O.  Knee: All structures visualized. Anteromedial,  posteromedial, and posterolateral menisci unremarkable without tearing, fraying, effusion, or displacement. Moderate narrowing of the lateral medial joint lines. Anterior lateral meniscus does have an overlying cystic to be contributing  to patient's discomfort. Hypoechoic changes noted. Patient's lateral meniscus is have  degenerative changes with a degenerative meniscal tear but no displacement noted. Hypoechoic changes minimal but patient does have increasing Doppler flow of bone in this area. Patellar Tendon unremarkable on long and transverse views without effusion. No abnormality of prepatellar bursa. LCL and MCL unremarkable on long and transverse views. No abnormality of origin of medial or lateral head of the gastrocnemius.  IMPRESSION:  Lateral meniscal chronic tear with overlying. Meniscal cyst and increased Doppler flow of bone of tibia that no specific mass noted.       Impression and Recommendations:     This case required medical decision making of moderate complexity.

## 2014-08-27 NOTE — Patient Instructions (Signed)
Good to see you Ice 20 minutes 2 times daily. Usually after activity and before bed. Exercises 3 times a week.  Try the topical medicine up to 2 times daily.  Try brace with activity Vitamin D 2000 IU daily Tylenol 325 mg 3 times daily.  Glucosamine 1500mg  daily.  Xrays downstairs today I do like the idea of the bone scan.  See me again in 3 weeks and we can drain the meniscal cyst if needed.

## 2014-08-27 NOTE — Assessment & Plan Note (Signed)
Discussed with patient at this time that this could be contributing to some of her pain. We will continue to monitor. Patient continues to have pain at follow-up we'll consider aspiration. Patient has x-rays and bone scan pending.

## 2014-08-27 NOTE — Assessment & Plan Note (Signed)
Patient does have right knee pain. Patient's x-rays in 2014 were reviewed by me showing only minimal osteophytic changes at that time. Patient states most of her pain seems to be localized over the anterior tibial area on the lateral aspect. There seems to be some bony involvement but is nontender on exam today. This does seem to be hypertrophic compared to the contralateral side. X-rays were ordered today with patient having a past medical history significant for breast cancer metastasis to her hip that needed a replacement. On ultrasound patient was also found to have apparent meniscal cyst can be contributing to this. Patient has a lateral meniscal tear as well that we will continue to monitor as well. Patient will try topical anti-inflammatory at half the dose and warned to decrease use if it does cause any stomach irritation. We discussed icing protocol patient was given a brace for some more stability. We discussed over-the-counter medications a cane be beneficial. Patient and will come back and see me again in 3 weeks. Continuing to have pain around like to consider doing an aspiration and injection of the meniscal cyst to see if this will have any benefit. Patient is already scheduled to have a bone scan in the next 2 weeks which will tell us if this is any bony metastasis as well.

## 2014-09-04 ENCOUNTER — Encounter (HOSPITAL_COMMUNITY): Payer: Self-pay

## 2014-09-04 ENCOUNTER — Other Ambulatory Visit (HOSPITAL_BASED_OUTPATIENT_CLINIC_OR_DEPARTMENT_OTHER): Payer: Medicare Other

## 2014-09-04 ENCOUNTER — Ambulatory Visit (INDEPENDENT_AMBULATORY_CARE_PROVIDER_SITE_OTHER)
Admission: RE | Admit: 2014-09-04 | Discharge: 2014-09-04 | Disposition: A | Discharge status: Home / Self Care | Payer: Medicare Other | Source: Ambulatory Visit | Attending: Family Medicine | Admitting: Family Medicine

## 2014-09-04 ENCOUNTER — Ambulatory Visit (HOSPITAL_COMMUNITY)
Admission: RE | Admit: 2014-09-04 | Discharge: 2014-09-04 | Disposition: A | Discharge status: Home / Self Care | Payer: Medicare Other | Source: Ambulatory Visit | Attending: Internal Medicine | Admitting: Internal Medicine

## 2014-09-04 ENCOUNTER — Encounter (HOSPITAL_COMMUNITY)
Admission: RE | Admit: 2014-09-04 | Discharge: 2014-09-04 | Disposition: A | Discharge status: Home / Self Care | Payer: Medicare Other | Source: Ambulatory Visit | Attending: Internal Medicine | Admitting: Internal Medicine

## 2014-09-04 ENCOUNTER — Encounter (HOSPITAL_COMMUNITY): Payer: Medicare Other

## 2014-09-04 DIAGNOSIS — C7951 Secondary malignant neoplasm of bone: Secondary | ICD-10-CM | POA: Insufficient documentation

## 2014-09-04 DIAGNOSIS — C50911 Malignant neoplasm of unspecified site of right female breast: Secondary | ICD-10-CM

## 2014-09-04 DIAGNOSIS — K8020 Calculus of gallbladder without cholecystitis without obstruction: Secondary | ICD-10-CM | POA: Diagnosis not present

## 2014-09-04 DIAGNOSIS — M1611 Unilateral primary osteoarthritis, right hip: Secondary | ICD-10-CM | POA: Diagnosis not present

## 2014-09-04 DIAGNOSIS — M1711 Unilateral primary osteoarthritis, right knee: Secondary | ICD-10-CM | POA: Diagnosis not present

## 2014-09-04 DIAGNOSIS — M160 Bilateral primary osteoarthritis of hip: Secondary | ICD-10-CM | POA: Diagnosis not present

## 2014-09-04 DIAGNOSIS — M25561 Pain in right knee: Secondary | ICD-10-CM

## 2014-09-04 DIAGNOSIS — R112 Nausea with vomiting, unspecified: Secondary | ICD-10-CM | POA: Diagnosis not present

## 2014-09-04 DIAGNOSIS — I2510 Atherosclerotic heart disease of native coronary artery without angina pectoris: Secondary | ICD-10-CM | POA: Diagnosis not present

## 2014-09-04 DIAGNOSIS — M19012 Primary osteoarthritis, left shoulder: Secondary | ICD-10-CM | POA: Diagnosis not present

## 2014-09-04 DIAGNOSIS — C50919 Malignant neoplasm of unspecified site of unspecified female breast: Secondary | ICD-10-CM | POA: Diagnosis not present

## 2014-09-04 DIAGNOSIS — M19011 Primary osteoarthritis, right shoulder: Secondary | ICD-10-CM | POA: Diagnosis not present

## 2014-09-04 DIAGNOSIS — J8410 Pulmonary fibrosis, unspecified: Secondary | ICD-10-CM | POA: Insufficient documentation

## 2014-09-04 LAB — CANCER ANTIGEN 27.29: CA 27.29: 73 U/mL — AB (ref 0–39)

## 2014-09-04 LAB — CBC WITH DIFFERENTIAL/PLATELET
BASO%: 0.6 % (ref 0.0–2.0)
Basophils Absolute: 0 10*3/uL (ref 0.0–0.1)
EOS ABS: 0.1 10*3/uL (ref 0.0–0.5)
EOS%: 1.5 % (ref 0.0–7.0)
HCT: 39 % (ref 34.8–46.6)
HGB: 12.4 g/dL (ref 11.6–15.9)
LYMPH%: 13.5 % — AB (ref 14.0–49.7)
MCH: 28.9 pg (ref 25.1–34.0)
MCHC: 31.7 g/dL (ref 31.5–36.0)
MCV: 91.3 fL (ref 79.5–101.0)
MONO#: 0.7 10*3/uL (ref 0.1–0.9)
MONO%: 10.9 % (ref 0.0–14.0)
NEUT#: 4.5 10*3/uL (ref 1.5–6.5)
NEUT%: 73.5 % (ref 38.4–76.8)
Platelets: 275 10*3/uL (ref 145–400)
RBC: 4.28 10*6/uL (ref 3.70–5.45)
RDW: 15.2 % — ABNORMAL HIGH (ref 11.2–14.5)
WBC: 6.2 10*3/uL (ref 3.9–10.3)
lymph#: 0.8 10*3/uL — ABNORMAL LOW (ref 0.9–3.3)

## 2014-09-04 LAB — COMPREHENSIVE METABOLIC PANEL (CC13)
ALT: 6 U/L (ref 0–55)
ANION GAP: 11 meq/L (ref 3–11)
AST: 11 U/L (ref 5–34)
Albumin: 3.3 g/dL — ABNORMAL LOW (ref 3.5–5.0)
Alkaline Phosphatase: 93 U/L (ref 40–150)
BUN: 12.5 mg/dL (ref 7.0–26.0)
CO2: 25 meq/L (ref 22–29)
CREATININE: 1.3 mg/dL — AB (ref 0.6–1.1)
Calcium: 10.1 mg/dL (ref 8.4–10.4)
Chloride: 106 mEq/L (ref 98–109)
GLUCOSE: 112 mg/dL (ref 70–140)
Potassium: 3.9 mEq/L (ref 3.5–5.1)
Sodium: 142 mEq/L (ref 136–145)
Total Bilirubin: 0.5 mg/dL (ref 0.20–1.20)
Total Protein: 7.7 g/dL (ref 6.4–8.3)

## 2014-09-04 MED ORDER — TECHNETIUM TC 99M MEDRONATE IV KIT
26.2000 | PACK | Freq: Once | INTRAVENOUS | Status: AC | PRN
  Administered 2014-09-04: 26.2 via INTRAVENOUS

## 2014-09-04 MED ORDER — IOHEXOL 300 MG/ML  SOLN
100.0000 mL | Freq: Once | INTRAMUSCULAR | Status: AC | PRN
  Administered 2014-09-04: 100 mL via INTRAVENOUS

## 2014-09-11 ENCOUNTER — Telehealth: Payer: Self-pay | Admitting: Internal Medicine

## 2014-09-11 ENCOUNTER — Ambulatory Visit (HOSPITAL_BASED_OUTPATIENT_CLINIC_OR_DEPARTMENT_OTHER): Payer: Medicare Other | Admitting: Internal Medicine

## 2014-09-11 ENCOUNTER — Ambulatory Visit: Payer: Medicare Other

## 2014-09-11 ENCOUNTER — Encounter: Payer: Self-pay | Admitting: Internal Medicine

## 2014-09-11 ENCOUNTER — Other Ambulatory Visit: Payer: Self-pay

## 2014-09-11 VITALS — BP 143/76 | HR 77 | Temp 98.2°F | Resp 19 | Ht 63.0 in | Wt 224.3 lb

## 2014-09-11 DIAGNOSIS — C7951 Secondary malignant neoplasm of bone: Secondary | ICD-10-CM | POA: Diagnosis not present

## 2014-09-11 DIAGNOSIS — C50911 Malignant neoplasm of unspecified site of right female breast: Secondary | ICD-10-CM

## 2014-09-11 DIAGNOSIS — R42 Dizziness and giddiness: Secondary | ICD-10-CM

## 2014-09-11 NOTE — Telephone Encounter (Signed)
gv and printed appt sched and avs for pt for Jan and Feb 2016 °

## 2014-09-11 NOTE — Progress Notes (Signed)
[facility] [phone]   [phone]  OFFICE PROGRESS NOTE  Walker Kehr, MD [place] [address]  DIAGNOSIS AND STAGE: Metastatic breast adenocarcinoma, presenting with bone metastasis in the left hip area. This was initially diagnosed as stage II (T2, N1, M0) invasive right breast carcinoma with positive estrogen and progesterone receptors and HER-2/neu positive in July 2002   PRIOR THERAPY:  1. Status post right lumpectomy with right axillary lymph node dissection revealing 4/9 lymph nodes that were positive for malignancy.  2. Status post 8 cycles of systemic chemotherapy with CMF, completed March 2003.  3. Status post radiotherapy to the remaining right breast under the care of Dr. Tammi Klippel.  4. Status post 2 years of treatment with tamoxifen, started July 2003, then switched to Femara by Dr. Humphrey Rolls in August 2005. The patient discontinued the treatment in February 2009.  5. Status post left total hip replacement on Apr 01, 2011, secondary to pathologic left femoral neck fracture. 6. Zometa 4 mg IV on 3 monthly basis. Discontinued secondary to renal insufficiency.   CURRENT THERAPY:  1. Arimidex 1 mg p.o. daily, started 03/18/2011.  2. Xgeva 120 mcg subcutaneously on 3 monthly basis.  CHEMOTHERAPY INTENT: Palliative  CURRENT # OF HORMONAL THERAPY CYCLES: 45 CURRENT ANTIEMETICS: None  CURRENT SMOKING STATUS: Nonsmoker  ORAL HORMONAL THERAPY AND CONSENT: Yes.  CURRENT BISPHOSPHONATES USE: Zometa with stable renal function.  PAIN MANAGEMENT: Tramadol when necessary  NARCOTICS INDUCED CONSTIPATION: None  LIVING WILL AND CODE STATUS: Full code.   INTERVAL HISTORY:  [name] [name] 73 y.o. female returns to the clinic today for routine three-month followup visit. The patient is feeling fine today with no specific complaints except for mild fatigue and the persistent pain at the right knee. She had 2 episodes of vertigo  recently. She was evaluated by EMS but refused to go to the hospital. She has no significant weight loss or night sweats. She has no nausea or vomiting. The patient denied having any significant chest pain, but has shortness of breath with exertion with no cough or hemoptysis. She is tolerating her treatment with Arimidex fairly well. She was also on treatment with Xgeva every 3 months. She had repeat CBC, comprehensive metabolic panel, CA 67.70 as well as CT scan of the chest, abdomen and pelvis and bone scan performed recently and she is here for evaluation and discussion of her scan results.  MEDICAL HISTORY: Past Medical History  Diagnosis Date  . Asthma   . Hypertension   . Cerebrovascular accident     R thalamic CVA 01/2010  . Hyperlipemia   . Dyslipidemia   . TIA (transient ischemic attack)     x2 in 2011  . Low back pain   . Osteoarthritis   . Anxiety   . Breast cancer     2002; metastatic in 2012  . Diabetes mellitus type II     pt.states she's not diabetic    ALLERGIES:  is allergic to atorvastatin.  MEDICATIONS:  Current Outpatient Prescriptions  Medication Sig Dispense Refill  . acetaminophen (TYLENOL) 500 MG tablet Take 1,000 mg by mouth every 6 (six) hours as needed for mild pain or moderate pain.    Marland Kitchen amLODipine (NORVASC) 10 MG tablet Take 1 tablet (10 mg total) by mouth daily. 90 tablet 3  . anastrozole (ARIMIDEX) 1 MG tablet Take 1 tablet (1 mg total) by mouth daily. 90 tablet 1  .  Calcium Carbonate-Vit D-Min (CALCIUM 1200 PO) Take by mouth. Calcium chews, takes 2 daily    . Cholecalciferol (VITAMIN D3) 1000 UNITS CAPS Take 1 capsule by mouth daily.     . Fluticasone-Salmeterol (ADVAIR DISKUS) 100-50 MCG/DOSE AEPB Inhale 1 puff into the lungs 2 (two) times daily. 1 each 11  . losartan-hydrochlorothiazide (HYZAAR) 100-25 MG per tablet Take 1 tablet by mouth daily. 90 tablet 3  . meclizine (ANTIVERT) 12.5 MG tablet Take 1 tablet (12.5 mg total) by mouth 2 (two) times  daily as needed for dizziness. 10 tablet 0  . Naproxen Sodium (ALEVE) 220 MG CAPS Take 1-2 capsules by mouth daily as needed (for pain).     Marland Kitchen omeprazole (PRILOSEC) 40 MG capsule Take 1 capsule (40 mg total) by mouth daily. 30 capsule 11   No current facility-administered medications for this visit.    SURGICAL HISTORY:  Past Surgical History  Procedure Laterality Date  . Breast lumpectomy      Right breast 2002  . Joint replacement      L hip due to breast ca met    REVIEW OF SYSTEMS:  Constitutional: positive for fatigue Eyes: negative Ears, nose, mouth, throat, and face: negative Respiratory: negative Cardiovascular: negative Gastrointestinal: negative Genitourinary:negative Integument/breast: negative Hematologic/lymphatic: negative Musculoskeletal:negative Neurological: positive for vertigo Behavioral/Psych: negative Endocrine: negative Allergic/Immunologic: negative   PHYSICAL EXAMINATION: General appearance: alert, cooperative, fatigued and no distress Head: Normocephalic, without obvious abnormality, atraumatic Neck: no adenopathy, no JVD, supple, symmetrical, trachea midline and thyroid not enlarged, symmetric, no tenderness/mass/nodules Lymph nodes: Cervical, supraclavicular, and axillary nodes normal. Resp: clear to auscultation bilaterally Back: symmetric, no curvature. ROM normal. No CVA tenderness. Cardio: regular rate and rhythm, S1, S2 normal, no murmur, click, rub or gallop GI: soft, non-tender; bowel sounds normal; no masses,  no organomegaly Extremities: extremities normal, atraumatic, no cyanosis or edema Neurologic: Alert and oriented X 3, normal strength and tone. Normal symmetric reflexes. Normal coordination and gait  ECOG PERFORMANCE STATUS: 1 - Symptomatic but completely ambulatory  Blood pressure 143/76, pulse 77, temperature 98.2 F (36.8 C), temperature source Oral, resp. rate 19, height 5' 3"  (1.6 m), weight 224 lb 4.8 oz (101.742 kg), SpO2  100 %.  LABORATORY DATA: Lab Results  Component Value Date   WBC 6.2 09/04/2014   HGB 12.4 09/04/2014   HCT 39.0 09/04/2014   MCV 91.3 09/04/2014   PLT 275 09/04/2014      Chemistry      Component Value Date/Time   NA 142 09/04/2014 0836   NA 139 06/29/2014 0835   NA 140 05/03/2012 1449   K 3.9 09/04/2014 0836   K 3.8 06/29/2014 0835   K 4.3 05/03/2012 1449   CL 105 06/29/2014 0835   CL 109* 04/25/2013 1023   CL 99 05/03/2012 1449   CO2 25 09/04/2014 0836   CO2 22 06/29/2014 0835   CO2 27 05/03/2012 1449   BUN 12.5 09/04/2014 0836   BUN 12 06/29/2014 0835   BUN 14 05/03/2012 1449   CREATININE 1.3* 09/04/2014 0836   CREATININE 0.98 06/29/2014 0835   CREATININE 1.1 05/03/2012 1449      Component Value Date/Time   CALCIUM 10.1 09/04/2014 0836   CALCIUM 8.9 06/29/2014 0835   CALCIUM 9.1 05/03/2012 1449   ALKPHOS 93 09/04/2014 0836   ALKPHOS 95 06/29/2014 0835   ALKPHOS 78 05/03/2012 1449   AST 11 09/04/2014 0836   AST 14 06/29/2014 0835   AST 19 05/03/2012 1449   ALT  6 09/04/2014 0836   ALT 8 06/29/2014 0835   ALT 14 05/03/2012 1449   BILITOT 0.50 09/04/2014 0836   BILITOT 0.4 06/29/2014 0835   BILITOT 0.50 05/03/2012 1449     CA 27.29: 73  RADIOGRAPHIC STUDIES: Ct Chest W Contrast  09/04/2014   CLINICAL DATA:  Right breast cancer with bone metastases. Nausea and vomiting.  EXAM: CT CHEST, ABDOMEN, AND PELVIS WITH CONTRAST  TECHNIQUE: Multidetector CT imaging of the chest, abdomen and pelvis was performed following the standard protocol during bolus administration of intravenous contrast.  CONTRAST:  175m OMNIPAQUE IOHEXOL 300 MG/ML  SOLN  COMPARISON:  06/29/2014.  FINDINGS: CT CHEST FINDINGS  Left IJ Port-A-Cath terminates at the SVC RA junction. There may be a retained catheter fragment in the anterior left chest wall (image 4). No pathologically enlarged mediastinal, hilar or axillary lymph nodes. Surgical clips are seen in the right axilla. Postoperative  changes of right breast lumpectomy. Coronary artery calcification. Heart is at the upper limits of normal in size to mildly enlarged. No pericardial effusion.  Subpleural radiation fibrosis in the anterior right lung. Mild volume loss in the medial aspects of both lower lobes, chronic in appearance. No pleural fluid. Airway is unremarkable.  CT ABDOMEN AND PELVIS FINDINGS  Hepatobiliary: Liver is unremarkable. A juxta diaphragmatic lymph node is sub cm in short axis size. Stones are seen in the gallbladder. No biliary ductal dilatation.  Pancreas: Negative.  Spleen: Negative.  Adrenals/Urinary Tract: Adrenal glands and kidneys are unremarkable. Ureters and bladder are decompressed.  Stomach/Bowel: Stomach, small bowel, appendix and colon are unremarkable.  Vascular/Lymphatic: Atherosclerotic calcification of the arterial vasculature without abdominal aortic aneurysm. No pathologically enlarged lymph nodes.  Reproductive: Uterus appears to be absent. Ovaries are not well-visualized.  Other: No free fluid.  Mesenteries and peritoneum are unremarkable.  Musculoskeletal: Left hip arthroplasty. Advanced degenerative changes in the right hip. No worrisome lytic or sclerotic lesions. Degenerative changes are seen in the spine. Mild anterior wedging of T6, unchanged.  IMPRESSION: 1. Postoperative changes of right breast lumpectomy and right axillary lymph node dissection with mild radiation fibrosis in the anterior right lung. No evidence of metastatic disease. 2. Coronary artery calcification. 3. Cholelithiasis. 4. Advanced right hip osteoarthritis.   Electronically Signed   By: MLorin PicketM.D.   On: 09/04/2014 12:51   Nm Bone Scan Whole Body  09/04/2014   CLINICAL DATA:  Right breast carcinoma with bone metastasis, subsequent evaluation  EXAM: NUCLEAR MEDICINE WHOLE BODY BONE SCAN  TECHNIQUE: Whole body anterior and posterior images were obtained approximately 3 hours after intravenous injection of  radiopharmaceutical.  RADIOPHARMACEUTICALS:  26.2 MCi Technetium-99 MDP  COMPARISON:  Bone scan 02/22/14, CT 02/22/14, PET/CT 07/23/11  FINDINGS: Bilateral flank kidneys. Arthropathic changes in the shoulders and in the lower thoracic and mid to lower lumbar spine, all stable. Right foot arthropathy, stable. Arthropathy right hip joint, similar to prior study. Changes of left hip arthroplasty, with photopenia in the femoral head and neck and mild marginal uptake, unchanged.  IMPRESSION: No change from prior study. No suspicious findings to suggest interval osseous metastasis.   Electronically Signed   By: RSkipper ClicheM.D.   On: 09/04/2014 12:27   Ct Abdomen Pelvis W Contrast  09/04/2014   CLINICAL DATA:  Right breast cancer with bone metastases. Nausea and vomiting.  EXAM: CT CHEST, ABDOMEN, AND PELVIS WITH CONTRAST  TECHNIQUE: Multidetector CT imaging of the chest, abdomen and pelvis was performed following the standard protocol during  bolus administration of intravenous contrast.  CONTRAST:  151m OMNIPAQUE IOHEXOL 300 MG/ML  SOLN  COMPARISON:  06/29/2014.  FINDINGS: CT CHEST FINDINGS  Left IJ Port-A-Cath terminates at the SVC RA junction. There may be a retained catheter fragment in the anterior left chest wall (image 4). No pathologically enlarged mediastinal, hilar or axillary lymph nodes. Surgical clips are seen in the right axilla. Postoperative changes of right breast lumpectomy. Coronary artery calcification. Heart is at the upper limits of normal in size to mildly enlarged. No pericardial effusion.  Subpleural radiation fibrosis in the anterior right lung. Mild volume loss in the medial aspects of both lower lobes, chronic in appearance. No pleural fluid. Airway is unremarkable.  CT ABDOMEN AND PELVIS FINDINGS  Hepatobiliary: Liver is unremarkable. A juxta diaphragmatic lymph node is sub cm in short axis size. Stones are seen in the gallbladder. No biliary ductal dilatation.  Pancreas: Negative.   Spleen: Negative.  Adrenals/Urinary Tract: Adrenal glands and kidneys are unremarkable. Ureters and bladder are decompressed.  Stomach/Bowel: Stomach, small bowel, appendix and colon are unremarkable.  Vascular/Lymphatic: Atherosclerotic calcification of the arterial vasculature without abdominal aortic aneurysm. No pathologically enlarged lymph nodes.  Reproductive: Uterus appears to be absent. Ovaries are not well-visualized.  Other: No free fluid.  Mesenteries and peritoneum are unremarkable.  Musculoskeletal: Left hip arthroplasty. Advanced degenerative changes in the right hip. No worrisome lytic or sclerotic lesions. Degenerative changes are seen in the spine. Mild anterior wedging of T6, unchanged.  IMPRESSION: 1. Postoperative changes of right breast lumpectomy and right axillary lymph node dissection with mild radiation fibrosis in the anterior right lung. No evidence of metastatic disease. 2. Coronary artery calcification. 3. Cholelithiasis. 4. Advanced right hip osteoarthritis.   Electronically Signed   By: MLorin PicketM.D.   On: 09/04/2014 12:51   UKoreaExtrem Low Right Ltd  09/01/2014   MSK UKoreaperformed of: Right knee This study was ordered, performed, and interpreted by ZCharlann BoxerD.O.  Knee: All structures visualized. Anteromedial, posteromedial, and posterolateral menisci unremarkable  without tearing, fraying, effusion, or displacement. Moderate narrowing of  the lateral medial joint lines. Anterior lateral meniscus does have an overlying cystic to be contributing  to patient's discomfort. Hypoechoic changes noted. Patient's lateral  meniscus is have degenerative changes with a degenerative meniscal tear  but no displacement noted. Hypoechoic changes minimal but patient does  have increasing Doppler flow of bone in this area. Patellar Tendon unremarkable on long and transverse views without  effusion. No abnormality of prepatellar bursa. LCL and MCL unremarkable on long and transverse views. No  abnormality of origin of medial or lateral head of the gastrocnemius.  IMPRESSION: Lateral meniscal chronic tear with overlying. Meniscal cyst  and increased Doppler flow of bone of tibia that no specific mass noted.    Dg Knee Complete 4 Views Right  09/04/2014   CLINICAL DATA:  Subsequent encounter for chronic right knee pain.  EXAM: RIGHT KNEE - COMPLETE 4+ VIEW  COMPARISON:  04/25/2013.  FINDINGS: Four views study shows no fracture. No subluxation or dislocation. Mild loss of joint space is seen in the medial and lateral compartments. Hypertrophic spurring is visible in all 3 compartments. No evidence for joint effusion. No worrisome lytic or sclerotic osseous abnormality.  IMPRESSION: Tricompartmental degenerative changes, slightly progressed in the interval.   Electronically Signed   By: EMisty StanleyM.D.   On: 09/04/2014 14:14   ASSESSMENT AND PLAN: This is a very pleasant 73years old African American  female with metastatic breast adenocarcinoma with bone metastasis. The patient is currently on hormonal therapy with Arimidex with no significant evidence for disease progression.  Her recent scan showed no evidence for disease progression, but the tumor marker CA 27.29 is stable and currently 73.  I discussed the scan and lab results with the patient. I recommended for the patient to continue her current treatment with Arimidex  The patient will also continue her current treatment with Xgeva 120 mcg subcutaneously every 3 months. For the vertigo, I recommended for the patient to see a neurologist for evaluation at this condition persists. She was advised to call immediately if she has any concerning symptoms in the interval. The patient voices understanding of current disease status and treatment options and is in agreement with the current care plan.  All questions were answered. The patient knows to call the clinic with any problems, questions or concerns. We can certainly see the patient  much sooner if necessary.  Disclaimer: This note was dictated with voice recognition software. Similar sounding words can inadvertently be transcribed and may not be corrected upon review.

## 2014-09-17 ENCOUNTER — Encounter: Payer: Self-pay | Admitting: Family Medicine

## 2014-09-17 ENCOUNTER — Ambulatory Visit (INDEPENDENT_AMBULATORY_CARE_PROVIDER_SITE_OTHER): Payer: Medicare Other | Admitting: Family Medicine

## 2014-09-17 VITALS — BP 142/80 | HR 96 | Wt 222.0 lb

## 2014-09-17 DIAGNOSIS — M1990 Unspecified osteoarthritis, unspecified site: Secondary | ICD-10-CM | POA: Diagnosis not present

## 2014-09-17 DIAGNOSIS — M1991 Primary osteoarthritis, unspecified site: Secondary | ICD-10-CM

## 2014-09-17 MED ORDER — DICLOFENAC SODIUM 2 % TD SOLN: TRANSDERMAL | Status: DC

## 2014-09-17 NOTE — Patient Instructions (Signed)
Good to see you 6 hours you should ice it.  Continue the exercises and the brace.  Try the pennsaid 2 times daily and call the pharmacy to try to speed up the process.  See me again in 3-4 weeks.  If still in pain we have other injections or we can talk about surgery.

## 2014-09-17 NOTE — Progress Notes (Signed)
  [facility] [address], [place] [address] Phone: [phone] Subjective:       CC: Knee pain, right   [id number] [name] [name] is a 73 y.o. female coming in with complaint of right knee pain. Patient has had this pain intermittently for greater than one year. Patient states that the frequency and duration of pain has been increasing. Patient does have a past medical history significant for breast cancer with metastasis of the bone causing a hip replacement in the past. Patient has had 2 rounds of radiation as well as chemotherapy medications.patient had x-rays showing patient did have severe osteophytic changes of this knee. Patient states that the pain is getting worse and she has transition from a cane to a walker. Patient states that this is very difficult. Patient states that the pain is starting to affect her daily activities. Patient did not respond well to the conservative therapy. Patient's x-rays did not show any metastasis on x-ray.    Past medical history, social, surgical and family history all reviewed in electronic medical record.   Review of Systems: No headache, visual changes, nausea, vomiting, diarrhea, constipation, dizziness, abdominal pain, skin rash, fevers, chills, night sweats, weight loss, swollen lymph nodes, body aches, joint swelling, muscle aches, chest pain, shortness of breath, mood changes.   Objective Blood pressure 142/80, pulse 96, weight 222 lb (100.699 kg), SpO2 96 %.  General: No apparent distress alert and oriented x3 mood and affect normal, dressed appropriately. Obese HEENT: Pupils equal, extraocular movements intact  Respiratory: Patient's speak in full sentences and does not appear short of breath  Cardiovascular: No lower extremity edema, non tender, no erythema  Skin: Warm dry intact with no signs of infection or rash on extremities or on axial skeleton.  Abdomen: Soft nontender  Neuro: Cranial  nerves II through XII are intact, neurovascularly intact in all extremities with 2+ DTRs and 2+ pulses.  Lymph: No lymphadenopathy of posterior or anterior cervical chain or axillae bilaterally.  Gait antalgic gait walking with a cane MSK:  Non tender with full range of motion and good stability and symmetric strength and tone of shoulders, elbows, wrist, hip, and ankles bilaterally.  Knee: Right Inspection reveals that patient's anterior tibial area on the lateral aspect does have a bony enlargement compared to the contralateral side that is nontender on exam today.. More tender over the medial joint line than previous exam ROM full in flexion and extension and lower leg rotation. Ligaments with solid consistent endpoints including ACL, PCL, LCL, MCL. Mildly positive Mcmurray's, Apley's, and Thessalonian tests. Non painful patellar compression. Patellar glide without crepitus. Patellar and quadriceps tendons unremarkable. Hamstring and quadriceps strength is normal.  Contralateral knee unremarkable.  After informed written and verbal consent, patient was seated on exam table. Right knee was prepped with alcohol swab and utilizing anterolateral approach, patient's right knee space was injected with 4:1  marcaine 0.5%: Kenalog 40mg /dL. Patient tolerated the procedure well without immediate complications.     Impression and Recommendations:     This case required medical decision making of moderate complexity.

## 2014-09-17 NOTE — Assessment & Plan Note (Addendum)
Patient was given an injection today and tolerated the procedure very well. Patient has no signs of metastasis to bone with this. Patient continues to have pain though I would consider further workup. Patient though did have a recent bone scan showing no significant findings in this area as well. Patient did respond very well to the injection and will continue with the bracing, home exercises and we discussed formal physical therapy which patient declined. Patient will come back to 4 weeks and we'll discuss the possibility of either viscous supplementation versus surgical intervention.  Spent greater than 25 minutes with patient face-to-face and had greater than 50% of counseling including as described above in assessment and plan.

## 2014-10-07 ENCOUNTER — Ambulatory Visit: Payer: Medicare Other | Admitting: Family Medicine

## 2014-11-08 ENCOUNTER — Ambulatory Visit (HOSPITAL_BASED_OUTPATIENT_CLINIC_OR_DEPARTMENT_OTHER): Payer: Medicare Other

## 2014-11-08 ENCOUNTER — Other Ambulatory Visit: Payer: Self-pay | Admitting: Medical Oncology

## 2014-11-08 ENCOUNTER — Other Ambulatory Visit: Payer: Self-pay | Admitting: *Deleted

## 2014-11-08 DIAGNOSIS — C7951 Secondary malignant neoplasm of bone: Principal | ICD-10-CM

## 2014-11-08 DIAGNOSIS — C50911 Malignant neoplasm of unspecified site of right female breast: Secondary | ICD-10-CM

## 2014-11-08 DIAGNOSIS — C50919 Malignant neoplasm of unspecified site of unspecified female breast: Secondary | ICD-10-CM

## 2014-11-08 LAB — CBC WITH DIFFERENTIAL/PLATELET
BASO%: 0.3 % (ref 0.0–2.0)
BASOS ABS: 0 10*3/uL (ref 0.0–0.1)
EOS%: 1.2 % (ref 0.0–7.0)
Eosinophils Absolute: 0.1 10*3/uL (ref 0.0–0.5)
HCT: 37.2 % (ref 34.8–46.6)
HGB: 11.9 g/dL (ref 11.6–15.9)
LYMPH#: 1.2 10*3/uL (ref 0.9–3.3)
LYMPH%: 17.5 % (ref 14.0–49.7)
MCH: 29.4 pg (ref 25.1–34.0)
MCHC: 32 g/dL (ref 31.5–36.0)
MCV: 91.9 fL (ref 79.5–101.0)
MONO#: 0.5 10*3/uL (ref 0.1–0.9)
MONO%: 8 % (ref 0.0–14.0)
NEUT#: 4.8 10*3/uL (ref 1.5–6.5)
NEUT%: 73 % (ref 38.4–76.8)
PLATELETS: 378 10*3/uL (ref 145–400)
RBC: 4.05 10*6/uL (ref 3.70–5.45)
RDW: 14.2 % (ref 11.2–14.5)
WBC: 6.6 10*3/uL (ref 3.9–10.3)

## 2014-11-08 LAB — COMPREHENSIVE METABOLIC PANEL (CC13)
ALBUMIN: 3.2 g/dL — AB (ref 3.5–5.0)
ALT: 12 U/L (ref 0–55)
ANION GAP: 11 meq/L (ref 3–11)
AST: 17 U/L (ref 5–34)
Alkaline Phosphatase: 95 U/L (ref 40–150)
BUN: 22.6 mg/dL (ref 7.0–26.0)
CALCIUM: 9.9 mg/dL (ref 8.4–10.4)
CO2: 29 meq/L (ref 22–29)
CREATININE: 1.9 mg/dL — AB (ref 0.6–1.1)
Chloride: 103 mEq/L (ref 98–109)
EGFR: 29 mL/min/{1.73_m2} — AB (ref >90)
GLUCOSE: 116 mg/dL (ref 70–140)
POTASSIUM: 4 meq/L (ref 3.5–5.1)
Sodium: 142 mEq/L (ref 136–145)
Total Bilirubin: 0.46 mg/dL (ref 0.20–1.20)
Total Protein: 8 g/dL (ref 6.4–8.3)

## 2014-11-08 MED ORDER — DENOSUMAB 120 MG/1.7ML ~~LOC~~ SOLN
120.0000 mg | Freq: Once | SUBCUTANEOUS | Status: AC
  Administered 2014-11-08: 120 mg via SUBCUTANEOUS
  Filled 2014-11-08: qty 1.7

## 2014-11-08 NOTE — Patient Instructions (Signed)
Denosumab injection What is this medicine? DENOSUMAB (den oh sue mab) slows bone breakdown. Prolia is used to treat osteoporosis in women after menopause and in men. Xgeva is used to prevent bone fractures and other bone problems caused by cancer bone metastases. Xgeva is also used to treat giant cell tumor of the bone. This medicine may be used for other purposes; ask your health care provider or pharmacist if you have questions. COMMON BRAND NAME(S): Prolia, XGEVA What should I tell my health care provider before I take this medicine? They need to know if you have any of these conditions: -dental disease -eczema -infection or history of infections -kidney disease or on dialysis -low blood calcium or vitamin D -malabsorption syndrome -scheduled to have surgery or tooth extraction -taking medicine that contains denosumab -thyroid or parathyroid disease -an unusual reaction to denosumab, other medicines, foods, dyes, or preservatives -pregnant or trying to get pregnant -breast-feeding How should I use this medicine? This medicine is for injection under the skin. It is given by a health care professional in a hospital or clinic setting. If you are getting Prolia, a special MedGuide will be given to you by the pharmacist with each prescription and refill. Be sure to read this information carefully each time. For Prolia, talk to your pediatrician regarding the use of this medicine in children. Special care may be needed. For Xgeva, talk to your pediatrician regarding the use of this medicine in children. While this drug may be prescribed for children as young as 13 years for selected conditions, precautions do apply. Overdosage: If you think you've taken too much of this medicine contact a poison control center or emergency room at once. Overdosage: If you think you have taken too much of this medicine contact a poison control center or emergency room at once. NOTE: This medicine is only for  you. Do not share this medicine with others. What if I miss a dose? It is important not to miss your dose. Call your doctor or health care professional if you are unable to keep an appointment. What may interact with this medicine? Do not take this medicine with any of the following medications: -other medicines containing denosumab This medicine may also interact with the following medications: -medicines that suppress the immune system -medicines that treat cancer -steroid medicines like prednisone or cortisone This list may not describe all possible interactions. Give your health care provider a list of all the medicines, herbs, non-prescription drugs, or dietary supplements you use. Also tell them if you smoke, drink alcohol, or use illegal drugs. Some items may interact with your medicine. What should I watch for while using this medicine? Visit your doctor or health care professional for regular checks on your progress. Your doctor or health care professional may order blood tests and other tests to see how you are doing. Call your doctor or health care professional if you get a cold or other infection while receiving this medicine. Do not treat yourself. This medicine may decrease your body's ability to fight infection. You should make sure you get enough calcium and vitamin D while you are taking this medicine, unless your doctor tells you not to. Discuss the foods you eat and the vitamins you take with your health care professional. See your dentist regularly. Brush and floss your teeth as directed. Before you have any dental work done, tell your dentist you are receiving this medicine. Do not become pregnant while taking this medicine or for 5 months after stopping   it. Women should inform their doctor if they wish to become pregnant or think they might be pregnant. There is a potential for serious side effects to an unborn child. Talk to your health care professional or pharmacist for more  information. What side effects may I notice from receiving this medicine? Side effects that you should report to your doctor or health care professional as soon as possible: -allergic reactions like skin rash, itching or hives, swelling of the face, lips, or tongue -breathing problems -chest pain -fast, irregular heartbeat -feeling faint or lightheaded, falls -fever, chills, or any other sign of infection -muscle spasms, tightening, or twitches -numbness or tingling -skin blisters or bumps, or is dry, peels, or red -slow healing or unexplained pain in the mouth or jaw -unusual bleeding or bruising Side effects that usually do not require medical attention (Report these to your doctor or health care professional if they continue or are bothersome.): -muscle pain -stomach upset, gas This list may not describe all possible side effects. Call your doctor for medical advice about side effects. You may report side effects to FDA at 1-800-FDA-1088. Where should I keep my medicine? This medicine is only given in a clinic, doctor's office, or other health care setting and will not be stored at home. NOTE: This sheet is a summary. It may not cover all possible information. If you have questions about this medicine, talk to your doctor, pharmacist, or health care provider.  2015, [employer] Standard. (2012-04-17 12:37:47)  

## 2014-11-11 ENCOUNTER — Ambulatory Visit: Payer: Medicare Other | Admitting: Family Medicine

## 2014-11-19 ENCOUNTER — Ambulatory Visit: Payer: Medicare Other | Admitting: Internal Medicine

## 2014-11-19 ENCOUNTER — Telehealth: Payer: Self-pay | Admitting: Internal Medicine

## 2014-11-19 NOTE — Telephone Encounter (Signed)
Patient no showed for FU.  Please advise.

## 2014-11-19 NOTE — Telephone Encounter (Signed)
Noted. Thx.

## 2014-12-05 ENCOUNTER — Other Ambulatory Visit: Payer: Medicare Other

## 2014-12-11 ENCOUNTER — Other Ambulatory Visit: Payer: Self-pay | Admitting: *Deleted

## 2014-12-11 ENCOUNTER — Telehealth: Payer: Self-pay | Admitting: Internal Medicine

## 2014-12-11 NOTE — Telephone Encounter (Signed)
, °

## 2014-12-12 ENCOUNTER — Encounter: Payer: Self-pay | Admitting: Internal Medicine

## 2014-12-12 ENCOUNTER — Other Ambulatory Visit (HOSPITAL_BASED_OUTPATIENT_CLINIC_OR_DEPARTMENT_OTHER): Payer: Medicare Other

## 2014-12-12 ENCOUNTER — Ambulatory Visit: Payer: Medicare Other

## 2014-12-12 ENCOUNTER — Telehealth: Payer: Self-pay | Admitting: Internal Medicine

## 2014-12-12 ENCOUNTER — Ambulatory Visit (HOSPITAL_BASED_OUTPATIENT_CLINIC_OR_DEPARTMENT_OTHER): Payer: Medicare Other | Admitting: Internal Medicine

## 2014-12-12 VITALS — BP 160/68 | HR 83 | Temp 97.9°F | Resp 18 | Ht 63.0 in | Wt 211.2 lb

## 2014-12-12 DIAGNOSIS — C50911 Malignant neoplasm of unspecified site of right female breast: Secondary | ICD-10-CM

## 2014-12-12 DIAGNOSIS — C7951 Secondary malignant neoplasm of bone: Secondary | ICD-10-CM

## 2014-12-12 DIAGNOSIS — C50919 Malignant neoplasm of unspecified site of unspecified female breast: Secondary | ICD-10-CM

## 2014-12-12 LAB — COMPREHENSIVE METABOLIC PANEL (CC13)
ALT: 8 U/L (ref 0–55)
ANION GAP: 11 meq/L (ref 3–11)
AST: 13 U/L (ref 5–34)
Albumin: 3.3 g/dL — ABNORMAL LOW (ref 3.5–5.0)
Alkaline Phosphatase: 99 U/L (ref 40–150)
BILIRUBIN TOTAL: 0.49 mg/dL (ref 0.20–1.20)
BUN: 10.3 mg/dL (ref 7.0–26.0)
CO2: 25 meq/L (ref 22–29)
Calcium: 9.5 mg/dL (ref 8.4–10.4)
Chloride: 106 mEq/L (ref 98–109)
Creatinine: 1.3 mg/dL — ABNORMAL HIGH (ref 0.6–1.1)
EGFR: 47 mL/min/{1.73_m2} — AB (ref >90)
GLUCOSE: 99 mg/dL (ref 70–140)
Potassium: 3.7 mEq/L (ref 3.5–5.1)
Sodium: 142 mEq/L (ref 136–145)
Total Protein: 7.6 g/dL (ref 6.4–8.3)

## 2014-12-12 LAB — CBC WITH DIFFERENTIAL/PLATELET
BASO%: 0.2 % (ref 0.0–2.0)
BASOS ABS: 0 10*3/uL (ref 0.0–0.1)
EOS ABS: 0.1 10*3/uL (ref 0.0–0.5)
EOS%: 1.4 % (ref 0.0–7.0)
HCT: 36.1 % (ref 34.8–46.6)
HEMOGLOBIN: 11.6 g/dL (ref 11.6–15.9)
LYMPH#: 1.2 10*3/uL (ref 0.9–3.3)
LYMPH%: 18.1 % (ref 14.0–49.7)
MCH: 30 pg (ref 25.1–34.0)
MCHC: 32.1 g/dL (ref 31.5–36.0)
MCV: 93.3 fL (ref 79.5–101.0)
MONO#: 0.7 10*3/uL (ref 0.1–0.9)
MONO%: 10.4 % (ref 0.0–14.0)
NEUT#: 4.5 10*3/uL (ref 1.5–6.5)
NEUT%: 69.9 % (ref 38.4–76.8)
Platelets: 239 10*3/uL (ref 145–400)
RBC: 3.87 10*6/uL (ref 3.70–5.45)
RDW: 14.9 % — AB (ref 11.2–14.5)
WBC: 6.4 10*3/uL (ref 3.9–10.3)

## 2014-12-12 LAB — CANCER ANTIGEN 27.29: CA 27.29: 47 U/mL — ABNORMAL HIGH (ref 0–39)

## 2014-12-12 MED ORDER — DENOSUMAB 120 MG/1.7ML ~~LOC~~ SOLN: 120.0000 mg | Freq: Once | SUBCUTANEOUS | Status: DC

## 2014-12-12 NOTE — Telephone Encounter (Signed)
gv and printed appt sched and avs for pt for May °

## 2014-12-12 NOTE — Progress Notes (Signed)
[facility] [phone]   Fax:([phone]  OFFICE PROGRESS NOTE  Walker Kehr, MD [place] [address]  DIAGNOSIS AND STAGE: Metastatic breast adenocarcinoma, presenting with bone metastasis in the left hip area. This was initially diagnosed as stage II (T2, N1, M0) invasive right breast carcinoma with positive estrogen and progesterone receptors and HER-2/neu positive in July 2002   PRIOR THERAPY:  1. Status post right lumpectomy with right axillary lymph node dissection revealing 4/9 lymph nodes that were positive for malignancy.  2. Status post 8 cycles of systemic chemotherapy with CMF, completed March 2003.  3. Status post radiotherapy to the remaining right breast under the care of Dr. Tammi Klippel.  4. Status post 2 years of treatment with tamoxifen, started July 2003, then switched to Femara by Dr. Humphrey Rolls in August 2005. The patient discontinued the treatment in February 2009.  5. Status post left total hip replacement on Apr 01, 2011, secondary to pathologic left femoral neck fracture. 6. Zometa 4 mg IV on 3 monthly basis. Discontinued secondary to renal insufficiency.   CURRENT THERAPY:  1. Arimidex 1 mg p.o. daily, started 03/18/2011.  2. Xgeva 120 mcg subcutaneously on 3 monthly basis.  CHEMOTHERAPY INTENT: Palliative  CURRENT # OF HORMONAL THERAPY CYCLES: 48 CURRENT ANTIEMETICS: None  CURRENT SMOKING STATUS: Nonsmoker  ORAL HORMONAL THERAPY AND CONSENT: Yes.  CURRENT BISPHOSPHONATES USE: Zometa with stable renal function.  PAIN MANAGEMENT: Tramadol when necessary  NARCOTICS INDUCED CONSTIPATION: None  LIVING WILL AND CODE STATUS: Full code.   INTERVAL HISTORY:  [name] [name] 74 y.o. female returns to the clinic today for routine three-month followup visit. The patient is feeling fine today with no specific complaints except for mild fatigue and arthralgia in the knees. She has no significant weight loss or night  sweats. She has no nausea or vomiting. The patient denied having any significant chest pain, but has shortness of breath with exertion with no cough or hemoptysis. She is tolerating her treatment with Arimidex fairly well. She was also on treatment with Xgeva every 3 months. She had repeat CBC, comprehensive metabolic panel, CA 96.72 performed earlier today and she is here for evaluation and discussion of her scan results.  MEDICAL HISTORY: Past Medical History  Diagnosis Date  . Asthma   . Hypertension   . Cerebrovascular accident     R thalamic CVA 01/2010  . Hyperlipemia   . Dyslipidemia   . TIA (transient ischemic attack)     x2 in 2011  . Low back pain   . Osteoarthritis   . Anxiety   . Breast cancer     2002; metastatic in 2012  . Diabetes mellitus type II     pt.states she's not diabetic    ALLERGIES:  is allergic to atorvastatin.  MEDICATIONS:  Current Outpatient Prescriptions  Medication Sig Dispense Refill  . acetaminophen (TYLENOL) 500 MG tablet Take 1,000 mg by mouth every 6 (six) hours as needed for mild pain or moderate pain.    Marland Kitchen amLODipine (NORVASC) 10 MG tablet Take 1 tablet (10 mg total) by mouth daily. 90 tablet 3  . anastrozole (ARIMIDEX) 1 MG tablet Take 1 tablet (1 mg total) by mouth daily. 90 tablet 1  . Calcium Carbonate-Vit D-Min (CALCIUM 1200 PO) Take by mouth. Calcium chews, takes 2 daily    . Cholecalciferol (VITAMIN D3) 1000 UNITS CAPS Take 1 capsule by mouth daily.     Marland Kitchen  Diclofenac Sodium 2 % SOLN Apply 1 pump twice daily. 112 g 3  . Fluticasone-Salmeterol (ADVAIR DISKUS) 100-50 MCG/DOSE AEPB Inhale 1 puff into the lungs 2 (two) times daily. 1 each 11  . losartan-hydrochlorothiazide (HYZAAR) 100-25 MG per tablet Take 1 tablet by mouth daily. 90 tablet 3  . meclizine (ANTIVERT) 12.5 MG tablet Take 1 tablet (12.5 mg total) by mouth 2 (two) times daily as needed for dizziness. 10 tablet 0  . Naproxen Sodium (ALEVE) 220 MG CAPS Take 1-2 capsules by mouth  daily as needed (for pain).     Marland Kitchen omeprazole (PRILOSEC) 40 MG capsule Take 1 capsule (40 mg total) by mouth daily. 30 capsule 11   No current facility-administered medications for this visit.    SURGICAL HISTORY:  Past Surgical History  Procedure Laterality Date  . Breast lumpectomy      Right breast 2002  . Joint replacement      L hip due to breast ca met    REVIEW OF SYSTEMS:  Constitutional: positive for fatigue Eyes: negative Ears, nose, mouth, throat, and face: negative Respiratory: negative Cardiovascular: negative Gastrointestinal: negative Genitourinary:negative Integument/breast: negative Hematologic/lymphatic: negative Musculoskeletal:negative Neurological: positive for vertigo Behavioral/Psych: negative Endocrine: negative Allergic/Immunologic: negative   PHYSICAL EXAMINATION: General appearance: alert, cooperative, fatigued and no distress Head: Normocephalic, without obvious abnormality, atraumatic Neck: no adenopathy, no JVD, supple, symmetrical, trachea midline and thyroid not enlarged, symmetric, no tenderness/mass/nodules Lymph nodes: Cervical, supraclavicular, and axillary nodes normal. Resp: clear to auscultation bilaterally Back: symmetric, no curvature. ROM normal. No CVA tenderness. Cardio: regular rate and rhythm, S1, S2 normal, no murmur, click, rub or gallop GI: soft, non-tender; bowel sounds normal; no masses,  no organomegaly Extremities: extremities normal, atraumatic, no cyanosis or edema Neurologic: Alert and oriented X 3, normal strength and tone. Normal symmetric reflexes. Normal coordination and gait  ECOG PERFORMANCE STATUS: 1 - Symptomatic but completely ambulatory  Blood pressure 160/68, pulse 83, temperature 97.9 F (36.6 C), temperature source Oral, resp. rate 18, height 5' 3"  (1.6 m), weight 211 lb 3.2 oz (95.8 kg), SpO2 100 %.  LABORATORY DATA: Lab Results  Component Value Date   WBC 6.4 12/12/2014   HGB 11.6 12/12/2014   HCT  36.1 12/12/2014   MCV 93.3 12/12/2014   PLT 239 12/12/2014      Chemistry      Component Value Date/Time   NA 142 12/12/2014 0823   NA 139 06/29/2014 0835   NA 140 05/03/2012 1449   K 3.7 12/12/2014 0823   K 3.8 06/29/2014 0835   K 4.3 05/03/2012 1449   CL 105 06/29/2014 0835   CL 109* 04/25/2013 1023   CL 99 05/03/2012 1449   CO2 25 12/12/2014 0823   CO2 22 06/29/2014 0835   CO2 27 05/03/2012 1449   BUN 10.3 12/12/2014 0823   BUN 12 06/29/2014 0835   BUN 14 05/03/2012 1449   CREATININE 1.3* 12/12/2014 0823   CREATININE 0.98 06/29/2014 0835   CREATININE 1.1 05/03/2012 1449      Component Value Date/Time   CALCIUM 9.5 12/12/2014 0823   CALCIUM 8.9 06/29/2014 0835   CALCIUM 9.1 05/03/2012 1449   ALKPHOS 99 12/12/2014 0823   ALKPHOS 95 06/29/2014 0835   ALKPHOS 78 05/03/2012 1449   AST 13 12/12/2014 0823   AST 14 06/29/2014 0835   AST 19 05/03/2012 1449   ALT 8 12/12/2014 0823   ALT 8 06/29/2014 0835   ALT 14 05/03/2012 1449   BILITOT 0.49 12/12/2014  [id number]   BILITOT 0.4 06/29/2014 0835   BILITOT 0.50 05/03/2012 1449      RADIOGRAPHIC STUDIES: No results found. ASSESSMENT AND PLAN: This is a very pleasant 74 years old African American female with metastatic breast adenocarcinoma with bone metastasis. The patient is currently on hormonal therapy with Arimidex with no significant evidence for disease progression.  Her CBC and comprehensive metabolic panel are unremarkable. CA 27.29 is still pending. I recommended for the patient to continue her current treatment with Arimidex  The patient will also continue her current treatment with Xgeva 120 mcg subcutaneously every 3 months. She will come back for follow-up visit in 3 months with repeat blood work. She was advised to call immediately if she has any concerning symptoms in the interval. The patient voices understanding of current disease status and treatment options and is in agreement with the current care  plan.  All questions were answered. The patient knows to call the clinic with any problems, questions or concerns. We can certainly see the patient much sooner if necessary.  Disclaimer: This note was dictated with voice recognition software. Similar sounding words can inadvertently be transcribed and may not be corrected upon review.

## 2015-01-30 ENCOUNTER — Other Ambulatory Visit: Payer: Self-pay | Admitting: Medical Oncology

## 2015-01-30 DIAGNOSIS — C7951 Secondary malignant neoplasm of bone: Principal | ICD-10-CM

## 2015-01-30 DIAGNOSIS — C50919 Malignant neoplasm of unspecified site of unspecified female breast: Secondary | ICD-10-CM

## 2015-01-31 ENCOUNTER — Ambulatory Visit: Payer: Medicare Other

## 2015-01-31 ENCOUNTER — Other Ambulatory Visit: Payer: Medicare Other

## 2015-03-06 ENCOUNTER — Ambulatory Visit: Payer: Medicare Other

## 2015-03-06 ENCOUNTER — Ambulatory Visit: Payer: Medicare Other | Admitting: Internal Medicine

## 2015-03-06 ENCOUNTER — Other Ambulatory Visit: Payer: Medicare Other

## 2015-04-03 ENCOUNTER — Ambulatory Visit: Payer: Medicare Other

## 2015-04-03 ENCOUNTER — Other Ambulatory Visit: Payer: Medicare Other

## 2015-04-25 ENCOUNTER — Ambulatory Visit: Payer: Medicare Other

## 2015-04-25 ENCOUNTER — Other Ambulatory Visit: Payer: Medicare Other

## 2015-05-20 ENCOUNTER — Telehealth: Payer: Self-pay

## 2015-05-20 NOTE — Telephone Encounter (Signed)
Patient on list of mammogram calls. Per Epic, pt currently seeing and being treated by Dr. Inda Merlin w/ oncology. Declined to call pt.

## 2015-05-30 ENCOUNTER — Encounter: Payer: Self-pay | Admitting: Cardiology

## 2015-06-26 ENCOUNTER — Other Ambulatory Visit: Payer: Medicare Other

## 2015-06-26 ENCOUNTER — Ambulatory Visit: Payer: Medicare Other

## 2015-07-28 ENCOUNTER — Ambulatory Visit (INDEPENDENT_AMBULATORY_CARE_PROVIDER_SITE_OTHER): Payer: Medicare Other | Admitting: Internal Medicine

## 2015-07-28 ENCOUNTER — Telehealth: Payer: Self-pay | Admitting: Internal Medicine

## 2015-07-28 ENCOUNTER — Encounter: Payer: Self-pay | Admitting: Internal Medicine

## 2015-07-28 VITALS — BP 168/100 | HR 76 | Wt 212.0 lb

## 2015-07-28 DIAGNOSIS — Z23 Encounter for immunization: Secondary | ICD-10-CM

## 2015-07-28 DIAGNOSIS — I10 Essential (primary) hypertension: Secondary | ICD-10-CM | POA: Diagnosis not present

## 2015-07-28 DIAGNOSIS — M79604 Pain in right leg: Secondary | ICD-10-CM | POA: Diagnosis not present

## 2015-07-28 DIAGNOSIS — M25561 Pain in right knee: Secondary | ICD-10-CM

## 2015-07-28 MED ORDER — ACETAMINOPHEN-CODEINE #2 300-15 MG PO TABS: 1.0000 | ORAL_TABLET | ORAL | Status: DC | PRN

## 2015-07-28 MED ORDER — MELOXICAM 7.5 MG PO TABS: 7.5000 mg | ORAL_TABLET | Freq: Every day | ORAL | Status: DC | PRN

## 2015-07-28 MED ORDER — METHYLPREDNISOLONE ACETATE 80 MG/ML IJ SUSP
80.0000 mg | Freq: Once | INTRAMUSCULAR | Status: AC
  Administered 2015-07-28: 80 mg via INTRA_ARTICULAR

## 2015-07-28 NOTE — Progress Notes (Signed)
Pre visit review using our clinic review tool, if applicable. No additional management support is needed unless otherwise documented below in the visit note. 

## 2015-07-28 NOTE — Telephone Encounter (Signed)
s.w. pt and sched f/u....pt ok and aware

## 2015-07-28 NOTE — Patient Instructions (Addendum)
Postprocedure instructions :    A Band-Aid should be left on for 12 hours. Injection therapy is not a cure itself. It is used in conjunction with other modalities. You can use nonsteroidal anti-inflammatories like ibuprofen , hot and cold compresses. Rest is recommended in the next 24 hours. You need to report immediately  if fever, chills or any signs of infection develop.    Piriformis Syndrome with Rehab Piriformis syndrome is a condition the affects the nervous system in the area of the hip, and is characterized by pain and possibly a loss of feeling in the backside (posterior) thigh that may extend down the entire length of the leg. The symptoms are caused by an increase in pressure on the sciatic nerve by the piriformis muscle, which is on the back of the hip and is responsible for externally rotating the hip. The sciatic nerve and its branches connect to much of the leg. Normally the sciatic nerve runs between the piriformis muscle and other muscles. However, in certain individuals the nerve runs through the muscle, which causes an increase in pressure on the nerve and results in the symptoms of piriformis syndrome. SYMPTOMS   Pain, tingling, numbness, or burning in the back of the thigh that may also extend down the entire leg.  Occasionally, tenderness in the buttock.  Loss of function of the leg.  Pain that worsens when using the piriformis muscle (running, jumping, or stairs).  Pain that increases with prolonged sitting.  Pain that is lessened by lying flat on the back. CAUSES   Piriformis syndrome is the result of an increase in pressure placed on the sciatic nerve. Oftentimes, piriformis syndrome is an overuse injury.  Stress placed on the nerve from a sudden increase in the intensity, frequency, or duration of training.  Compensation of other extremity injuries. RISK INCREASES WITH:  Sports that involve the piriformis muscle (running, walking, or jumping).  You are  born with (congenital) a defect in which the sciatic nerve passes through the muscle. PREVENTION  Warm up and stretch properly before activity.  Allow for adequate recovery between workouts.  Maintain physical fitness:  Strength, flexibility, and endurance.  Cardiovascular fitness. PROGNOSIS  If treated properly, the symptoms of piriformis syndrome usually resolve in 2 to 6 weeks. RELATED COMPLICATIONS   Persistent and possibly permanent pain and numbness in the lower extremity.  Weakness of the extremity that may progress to disability and inability to compete. TREATMENT  The most effective treatment for piriformis syndrome is rest from any activities that aggravate the symptoms. Ice and pain medication may help reduce pain and inflammation. The use of strengthening and stretching exercises may help reduce pain with activity. These exercises may be performed at home or with a therapist. A referral to a therapist may be given for further evaluation and treatment, such as ultrasound. Corticosteroid injections may be given to reduce inflammation that is causing pressure to be placed on the sciatic nerve. If nonsurgical (conservative) treatment is unsuccessful, then surgery may be recommended.  MEDICATION   If pain medication is necessary, then nonsteroidal anti-inflammatory medications, such as aspirin and ibuprofen, or other minor pain relievers, such as acetaminophen, are often recommended.  Do not take pain medication for 7 days before surgery.  Prescription pain relievers may be given if deemed necessary by your caregiver. Use only as directed and only as much as you need.  Corticosteroid injections may be given by your caregiver. These injections should be reserved for the most serious cases,  because they may only be given a certain number of times. HEAT AND COLD:   Cold treatment (icing) relieves pain and reduces inflammation. Cold treatment should be applied for 10 to 15 minutes  every 2 to 3 hours for inflammation and pain and immediately after any activity that aggravates your symptoms. Use ice packs or massage the area with a piece of ice (ice massage).  Heat treatment may be used prior to performing the stretching and strengthening activities prescribed by your caregiver, physical therapist, or athletic trainer. Use a heat pack or soak the injury in warm water. SEEK IMMEDIATE MEDICAL CARE IF:  Treatment seems to offer no benefit, or the condition worsens.  Any medications produce adverse side effects. EXERCISES RANGE OF MOTION (ROM) AND STRETCHING EXERCISES - Piriformis Syndrome These exercises may help you when beginning to rehabilitate your injury. Your symptoms may resolve with or without further involvement from your physician, physical therapist, or athletic trainer. While completing these exercises, remember:   Restoring tissue flexibility helps normal motion to return to the joints. This allows healthier, less painful movement and activity.  An effective stretch should be held for at least 30 seconds.  A stretch should never be painful. You should only feel a gentle lengthening or release in the stretched tissue. STRETCH - Hip Rotators  Lie on your back on a firm surface. Grasp your right / left knee with your right / left hand and your ankle with your opposite hand.  Keeping your hips and shoulders firmly planted, gently pull your right / left knee and rotate your lower leg toward your opposite shoulder until you feel a stretch in your buttocks.  Hold this stretch for __________ seconds. Repeat this stretch __________ times. Complete this stretch __________ times per day. STRETCH - Iliotibial Band  On the floor or bed, lie on your side so your right / left leg is on top. Bend your knee and grab your ankle.  Slowly bring your knee back so that your thigh is in line with your trunk. Keep your heel at your buttocks and gently arch your back so your head,  shoulders, and hips line up.  Slowly lower your leg so that your knee approaches the floor/bed until you feel a gentle stretch on the outside of your right / left thigh. If you do not feel a stretch and your knee will not fall farther, place the heel of your opposite foot on top of your knee and pull your thigh down farther.  Hold this stretch for __________ seconds. Repeat __________ times. Complete __________ times per day. STRENGTHENING EXERCISES - Piriformis Syndrome  These are some of the caregiver again or until your symptoms are resolved. Remember:   Strong muscles with good endurance tolerate stress better.  Do the exercises as initially prescribed by your caregiver. Progress slowly with each exercise, gradually increasing the number of repetitions and weight used under their guidance. STRENGTH - Hip Abductors, Straight Leg Raises Be aware of your form throughout the entire exercise so that you exercise the correct muscles. Sloppy form means that you are not strengthening the correct muscles.  Lie on your side so that your head, shoulders, knee, and hip line up. You may bend your lower knee to help maintain your balance. Your right / left leg should be on top.  Roll your hips slightly forward, so that your hips are stacked directly over each other and your right / left knee is facing forward.  Lift your top leg  up 4-6 inches, leading with your heel. Be sure that your foot does not drift forward or that your knee does not roll toward the ceiling.  Hold this position for __________ seconds. You should feel the muscles in your outer hip lifting (you may not notice this until your leg begins to tire).  Slowly lower your leg to the starting position. Allow the muscles to fully relax before beginning the next repetition. Repeat __________ times. Complete this exercise __________ times per day.  STRENGTH - Hip Abductors, Quadruped  On a firm, lightly padded surface, position yourself on  your hands and knees. Your hands should be directly below your shoulders and your knees should be directly below your hips.  Keeping your right / left knee bent, lift your leg out to the side. Keep your legs level and in line with your shoulders.  Position yourself on your hands and knees.  Hold for __________ seconds.  Keeping your trunk steady and your hips level, slowly lower your leg to the starting position. Repeat __________ times. Complete this exercise __________ times per day.  STRENGTH - Hip Abductors, Standing  Tie one end of a rubber exercise band/tubing to a secure surface (table, pole) and tie a loop at the other end.  Place the loop around your right / left ankle. Keeping your ankle with the band directly opposite of the secured end, step away until there is tension in the tube/band.  Hold onto a chair as needed for balance.  Keeping your back upright, your shoulders over your hips, and your toes pointing forward, lift your right / left leg out to your side. Be sure to lift your leg with your hip muscles. Do not "throw" your leg or tip your body to lift your leg.  Slowly and with control, return to the starting position. Repeat exercise __________ times. Complete this exercise __________ times per day.  Document Released: 10/18/2005 Document Revised: 03/04/2014 Document Reviewed: 01/30/2009 [facility] Patient Information 2015 [place], [place]. This information is not intended to replace advice given to you by your health care provider. Make sure you discuss any questions you have with your health care provider.

## 2015-07-28 NOTE — Assessment & Plan Note (Addendum)
Mobic T#2 prn  Potential benefits of a long term opioids use as well as potential risks (i.e. addiction risk, apnea etc) and complications (i.e. Somnolence, constipation and others) were explained to the patient and were aknowledged. Will inject

## 2015-07-28 NOTE — Progress Notes (Signed)
Subjective:  Patient ID: [name] [name], female    DOB: 12/08/1940  Age: 74 y.o. MRN: [id number]  CC: Leg Pain   HPI IMAGEAN LAGORIO presents for R knee and R leg pain that starts in the buttock at times and would irradiate down  Outpatient Prescriptions Prior to Visit  Medication Sig Dispense Refill  . acetaminophen (TYLENOL) 500 MG tablet Take 1,000 mg by mouth every 6 (six) hours as needed for mild pain or moderate pain.    Marland Kitchen amLODipine (NORVASC) 10 MG tablet Take 1 tablet (10 mg total) by mouth daily. 90 tablet 3  . anastrozole (ARIMIDEX) 1 MG tablet Take 1 tablet (1 mg total) by mouth daily. 90 tablet 1  . Calcium Carbonate-Vit D-Min (CALCIUM 1200 PO) Take by mouth. Calcium chews, takes 2 daily    . Cholecalciferol (VITAMIN D3) 1000 UNITS CAPS Take 1 capsule by mouth daily.     . Diclofenac Sodium 2 % SOLN Apply 1 pump twice daily. 112 g 3  . Fluticasone-Salmeterol (ADVAIR DISKUS) 100-50 MCG/DOSE AEPB Inhale 1 puff into the lungs 2 (two) times daily. 1 each 11  . losartan-hydrochlorothiazide (HYZAAR) 100-25 MG per tablet Take 1 tablet by mouth daily. 90 tablet 3  . meclizine (ANTIVERT) 12.5 MG tablet Take 1 tablet (12.5 mg total) by mouth 2 (two) times daily as needed for dizziness. 10 tablet 0  . Naproxen Sodium (ALEVE) 220 MG CAPS Take 1-2 capsules by mouth daily as needed (for pain).     Marland Kitchen omeprazole (PRILOSEC) 40 MG capsule Take 1 capsule (40 mg total) by mouth daily. 30 capsule 11   No facility-administered medications prior to visit.    ROS Review of Systems  Constitutional: Negative for chills, activity change, appetite change, fatigue and unexpected weight change.  HENT: Negative for congestion, mouth sores and sinus pressure.   Eyes: Negative for visual disturbance.  Respiratory: Negative for cough and chest tightness.   Gastrointestinal: Negative for nausea and abdominal pain.  Genitourinary: Negative for frequency, difficulty urinating and vaginal pain.    Musculoskeletal: Positive for back pain, arthralgias and gait problem.  Skin: Negative for pallor and rash.  Neurological: Negative for dizziness, tremors, weakness, numbness and headaches.  Psychiatric/Behavioral: Negative for suicidal ideas, confusion and sleep disturbance.    Objective:  BP 168/100 mmHg  Pulse 76  Wt 212 lb (96.163 kg)  SpO2 97%  BP Readings from Last 3 Encounters:  07/28/15 168/100  12/12/14 160/68  09/17/14 142/80    Wt Readings from Last 3 Encounters:  07/28/15 212 lb (96.163 kg)  12/12/14 211 lb 3.2 oz (95.8 kg)  09/17/14 222 lb (100.699 kg)    Physical Exam  Constitutional: She appears well-developed. No distress.  HENT:  Head: Normocephalic.  Right Ear: External ear normal.  Left Ear: External ear normal.  Nose: Nose normal.  Mouth/Throat: Oropharynx is clear and moist.  Eyes: Conjunctivae are normal. Pupils are equal, round, and reactive to light. Right eye exhibits no discharge. Left eye exhibits no discharge.  Neck: Normal range of motion. Neck supple. No JVD present. No tracheal deviation present. No thyromegaly present.  Cardiovascular: Normal rate, regular rhythm and normal heart sounds.   Pulmonary/Chest: No stridor. No respiratory distress. She has no wheezes.  Abdominal: Soft. Bowel sounds are normal. She exhibits no distension and no mass. There is no tenderness. There is no rebound and no guarding.  Musculoskeletal: She exhibits tenderness. She exhibits no edema.  Lymphadenopathy:    She has no  cervical adenopathy.  Neurological: She displays normal reflexes. No cranial nerve deficit. She exhibits normal muscle tone. Coordination abnormal.  Skin: No rash noted. No erythema.  Psychiatric: She has a normal mood and affect. Her behavior is normal. Judgment and thought content normal.  LS NT R str leg elev is (+/-) R buttock is tender to palp R knee is tender Using a walker    Ct Chest W Contrast  09/04/2014   CLINICAL DATA:   Right breast cancer with bone metastases. Nausea and vomiting.  EXAM: CT CHEST, ABDOMEN, AND PELVIS WITH CONTRAST  TECHNIQUE: Multidetector CT imaging of the chest, abdomen and pelvis was performed following the standard protocol during bolus administration of intravenous contrast.  CONTRAST:  158mL OMNIPAQUE IOHEXOL 300 MG/ML  SOLN  COMPARISON:  06/29/2014.  FINDINGS: CT CHEST FINDINGS  Left IJ Port-A-Cath terminates at the SVC RA junction. There may be a retained catheter fragment in the anterior left chest wall (image 4). No pathologically enlarged mediastinal, hilar or axillary lymph nodes. Surgical clips are seen in the right axilla. Postoperative changes of right breast lumpectomy. Coronary artery calcification. Heart is at the upper limits of normal in size to mildly enlarged. No pericardial effusion.  Subpleural radiation fibrosis in the anterior right lung. Mild volume loss in the medial aspects of both lower lobes, chronic in appearance. No pleural fluid. Airway is unremarkable.  CT ABDOMEN AND PELVIS FINDINGS  Hepatobiliary: Liver is unremarkable. A juxta diaphragmatic lymph node is sub cm in short axis size. Stones are seen in the gallbladder. No biliary ductal dilatation.  Pancreas: Negative.  Spleen: Negative.  Adrenals/Urinary Tract: Adrenal glands and kidneys are unremarkable. Ureters and bladder are decompressed.  Stomach/Bowel: Stomach, small bowel, appendix and colon are unremarkable.  Vascular/Lymphatic: Atherosclerotic calcification of the arterial vasculature without abdominal aortic aneurysm. No pathologically enlarged lymph nodes.  Reproductive: Uterus appears to be absent. Ovaries are not well-visualized.  Other: No free fluid.  Mesenteries and peritoneum are unremarkable.  Musculoskeletal: Left hip arthroplasty. Advanced degenerative changes in the right hip. No worrisome lytic or sclerotic lesions. Degenerative changes are seen in the spine. Mild anterior wedging of T6, unchanged.   IMPRESSION: 1. Postoperative changes of right breast lumpectomy and right axillary lymph node dissection with mild radiation fibrosis in the anterior right lung. No evidence of metastatic disease. 2. Coronary artery calcification. 3. Cholelithiasis. 4. Advanced right hip osteoarthritis.   Electronically Signed   By: Lorin Picket M.D.   On: 09/04/2014 12:51   Nm Bone Scan Whole Body  09/04/2014   CLINICAL DATA:  Right breast carcinoma with bone metastasis, subsequent evaluation  EXAM: NUCLEAR MEDICINE WHOLE BODY BONE SCAN  TECHNIQUE: Whole body anterior and posterior images were obtained approximately 3 hours after intravenous injection of radiopharmaceutical.  RADIOPHARMACEUTICALS:  26.2 MCi Technetium-99 MDP  COMPARISON:  Bone scan 02/22/14, CT 02/22/14, PET/CT 07/23/11  FINDINGS: Bilateral flank kidneys. Arthropathic changes in the shoulders and in the lower thoracic and mid to lower lumbar spine, all stable. Right foot arthropathy, stable. Arthropathy right hip joint, similar to prior study. Changes of left hip arthroplasty, with photopenia in the femoral head and neck and mild marginal uptake, unchanged.  IMPRESSION: No change from prior study. No suspicious findings to suggest interval osseous metastasis.   Electronically Signed   By: Skipper Cliche M.D.   On: 09/04/2014 12:27   Ct Abdomen Pelvis W Contrast  09/04/2014   CLINICAL DATA:  Right breast cancer with bone metastases. Nausea and vomiting.  EXAM: CT CHEST, ABDOMEN, AND PELVIS WITH CONTRAST  TECHNIQUE: Multidetector CT imaging of the chest, abdomen and pelvis was performed following the standard protocol during bolus administration of intravenous contrast.  CONTRAST:  173mL OMNIPAQUE IOHEXOL 300 MG/ML  SOLN  COMPARISON:  06/29/2014.  FINDINGS: CT CHEST FINDINGS  Left IJ Port-A-Cath terminates at the SVC RA junction. There may be a retained catheter fragment in the anterior left chest wall (image 4). No pathologically enlarged mediastinal, hilar  or axillary lymph nodes. Surgical clips are seen in the right axilla. Postoperative changes of right breast lumpectomy. Coronary artery calcification. Heart is at the upper limits of normal in size to mildly enlarged. No pericardial effusion.  Subpleural radiation fibrosis in the anterior right lung. Mild volume loss in the medial aspects of both lower lobes, chronic in appearance. No pleural fluid. Airway is unremarkable.  CT ABDOMEN AND PELVIS FINDINGS  Hepatobiliary: Liver is unremarkable. A juxta diaphragmatic lymph node is sub cm in short axis size. Stones are seen in the gallbladder. No biliary ductal dilatation.  Pancreas: Negative.  Spleen: Negative.  Adrenals/Urinary Tract: Adrenal glands and kidneys are unremarkable. Ureters and bladder are decompressed.  Stomach/Bowel: Stomach, small bowel, appendix and colon are unremarkable.  Vascular/Lymphatic: Atherosclerotic calcification of the arterial vasculature without abdominal aortic aneurysm. No pathologically enlarged lymph nodes.  Reproductive: Uterus appears to be absent. Ovaries are not well-visualized.  Other: No free fluid.  Mesenteries and peritoneum are unremarkable.  Musculoskeletal: Left hip arthroplasty. Advanced degenerative changes in the right hip. No worrisome lytic or sclerotic lesions. Degenerative changes are seen in the spine. Mild anterior wedging of T6, unchanged.  IMPRESSION: 1. Postoperative changes of right breast lumpectomy and right axillary lymph node dissection with mild radiation fibrosis in the anterior right lung. No evidence of metastatic disease. 2. Coronary artery calcification. 3. Cholelithiasis. 4. Advanced right hip osteoarthritis.   Electronically Signed   By: Lorin Picket M.D.   On: 09/04/2014 12:51   Dg Knee Complete 4 Views Right  09/04/2014   CLINICAL DATA:  Subsequent encounter for chronic right knee pain.  EXAM: RIGHT KNEE - COMPLETE 4+ VIEW  COMPARISON:  04/25/2013.  FINDINGS: Four views study shows no  fracture. No subluxation or dislocation. Mild loss of joint space is seen in the medial and lateral compartments. Hypertrophic spurring is visible in all 3 compartments. No evidence for joint effusion. No worrisome lytic or sclerotic osseous abnormality.  IMPRESSION: Tricompartmental degenerative changes, slightly progressed in the interval.   Electronically Signed   By: Misty Stanley M.D.   On: 09/04/2014 14:14    Procedure Note :     Procedure : Joint Injection, R knee   Indication:  Joint osteoarthritis with refractory  chronic pain.   Risks including unsuccessful procedure , bleeding, infection, bruising, skin atrophy, "steroid flare-up" and others were explained to the patient in detail as well as the benefits. Informed consent was obtained and signed.   Tthe patient was placed in a comfortable position. Lateral approach was used. Skin was prepped with Betadine and alcohol  and anesthetized a cooling spray. Then, a 5 cc syringe with a 1.5 inch long 25-gauge needle was used for a joint injection.. The needle was advanced  Into the knee joint cavity. I aspirated a small amount of intra-articular fluid to confirm correct placement of the needle and injected the joint with 5 mL of 2% lidocaine and 80 mg of Depo-Medrol .  Band-Aid was applied.   Tolerated well. Complications: None. Good  pain relief following the procedure.   Postprocedure instructions :    A Band-Aid should be left on for 12 hours. Injection therapy is not a cure itself. It is used in conjunction with other modalities. You can use nonsteroidal anti-inflammatories like ibuprofen , hot and cold compresses. Rest is recommended in the next 24 hours. You need to report immediately  if fever, chills or any signs of infection develop.   Assessment & Plan:   There are no diagnoses linked to this encounter. I am having Ms. Goswick start on meloxicam and acetaminophen-codeine. I am also having her maintain her Vitamin D3, Naproxen  Sodium, acetaminophen, meclizine, amLODipine, anastrozole, losartan-hydrochlorothiazide, omeprazole, Fluticasone-Salmeterol, Calcium Carbonate-Vit D-Min (CALCIUM 1200 PO), and Diclofenac Sodium.  Meds ordered this encounter  Medications  . meloxicam (MOBIC) 7.5 MG tablet    Sig: Take 1-2 tablets (7.5-15 mg total) by mouth daily as needed for pain.    Dispense:  60 tablet    Refill:  3  . acetaminophen-codeine (TYLENOL #2) 300-15 MG per tablet    Sig: Take 1-2 tablets by mouth every 4 (four) hours as needed for severe pain.    Dispense:  60 tablet    Refill:  1     Follow-up: No Follow-up on file.  Walker Kehr, MD

## 2015-07-28 NOTE — Assessment & Plan Note (Addendum)
Poss piriformis syndrome 9/16 Mobic T#2 prn  Potential benefits of a long term opioids use as well as potential risks (i.e. addiction risk, apnea etc) and complications (i.e. Somnolence, constipation and others) were explained to the patient and were aknowledged.

## 2015-07-28 NOTE — Assessment & Plan Note (Signed)
Re-start BP meds (ran out)

## 2015-07-31 ENCOUNTER — Other Ambulatory Visit: Payer: Self-pay | Admitting: *Deleted

## 2015-07-31 DIAGNOSIS — C50911 Malignant neoplasm of unspecified site of right female breast: Secondary | ICD-10-CM

## 2015-07-31 DIAGNOSIS — C7951 Secondary malignant neoplasm of bone: Principal | ICD-10-CM

## 2015-07-31 MED ORDER — ANASTROZOLE 1 MG PO TABS: 1.0000 mg | ORAL_TABLET | Freq: Every day | ORAL | Status: DC

## 2015-08-06 ENCOUNTER — Telehealth: Payer: Self-pay | Admitting: Internal Medicine

## 2015-08-06 ENCOUNTER — Ambulatory Visit (HOSPITAL_BASED_OUTPATIENT_CLINIC_OR_DEPARTMENT_OTHER): Payer: Medicare Other

## 2015-08-06 ENCOUNTER — Ambulatory Visit (HOSPITAL_BASED_OUTPATIENT_CLINIC_OR_DEPARTMENT_OTHER): Payer: Medicare Other | Admitting: Internal Medicine

## 2015-08-06 ENCOUNTER — Encounter: Payer: Self-pay | Admitting: Internal Medicine

## 2015-08-06 VITALS — BP 157/76 | HR 76 | Temp 98.6°F | Resp 18 | Ht 63.0 in | Wt 214.1 lb

## 2015-08-06 DIAGNOSIS — R0609 Other forms of dyspnea: Secondary | ICD-10-CM

## 2015-08-06 DIAGNOSIS — M25561 Pain in right knee: Secondary | ICD-10-CM

## 2015-08-06 DIAGNOSIS — C7951 Secondary malignant neoplasm of bone: Secondary | ICD-10-CM

## 2015-08-06 DIAGNOSIS — C50911 Malignant neoplasm of unspecified site of right female breast: Secondary | ICD-10-CM

## 2015-08-06 DIAGNOSIS — Z789 Other specified health status: Secondary | ICD-10-CM | POA: Insufficient documentation

## 2015-08-06 LAB — CBC WITH DIFFERENTIAL/PLATELET
BASO%: 0.9 % (ref 0.0–2.0)
Basophils Absolute: 0 10*3/uL (ref 0.0–0.1)
EOS ABS: 0.1 10*3/uL (ref 0.0–0.5)
EOS%: 1.6 % (ref 0.0–7.0)
HCT: 40.2 % (ref 34.8–46.6)
HGB: 12.8 g/dL (ref 11.6–15.9)
LYMPH%: 19.5 % (ref 14.0–49.7)
MCH: 27.7 pg (ref 25.1–34.0)
MCHC: 32 g/dL (ref 31.5–36.0)
MCV: 86.8 fL (ref 79.5–101.0)
MONO#: 0.5 10*3/uL (ref 0.1–0.9)
MONO%: 9 % (ref 0.0–14.0)
NEUT#: 3.8 10*3/uL (ref 1.5–6.5)
NEUT%: 69 % (ref 38.4–76.8)
PLATELETS: 277 10*3/uL (ref 145–400)
RBC: 4.63 10*6/uL (ref 3.70–5.45)
RDW: 15 % — ABNORMAL HIGH (ref 11.2–14.5)
WBC: 5.5 10*3/uL (ref 3.9–10.3)
lymph#: 1.1 10*3/uL (ref 0.9–3.3)

## 2015-08-06 LAB — COMPREHENSIVE METABOLIC PANEL (CC13)
ALT: <9 U/L (ref 0–55)
ANION GAP: 8 meq/L (ref 3–11)
AST: 11 U/L (ref 5–34)
Albumin: 3.5 g/dL (ref 3.5–5.0)
Alkaline Phosphatase: 85 U/L (ref 40–150)
BUN: 17.2 mg/dL (ref 7.0–26.0)
CHLORIDE: 108 meq/L (ref 98–109)
CO2: 26 mEq/L (ref 22–29)
Calcium: 9.6 mg/dL (ref 8.4–10.4)
Creatinine: 1.2 mg/dL — ABNORMAL HIGH (ref 0.6–1.1)
EGFR: 52 mL/min/{1.73_m2} — ABNORMAL LOW (ref >90)
Glucose: 111 mg/dl (ref 70–140)
Potassium: 4.7 mEq/L (ref 3.5–5.1)
SODIUM: 141 meq/L (ref 136–145)
Total Bilirubin: 0.41 mg/dL (ref 0.20–1.20)
Total Protein: 7.7 g/dL (ref 6.4–8.3)

## 2015-08-06 MED ORDER — DENOSUMAB 120 MG/1.7ML ~~LOC~~ SOLN
120.0000 mg | Freq: Once | SUBCUTANEOUS | Status: AC
  Administered 2015-08-06: 120 mg via SUBCUTANEOUS
  Filled 2015-08-06: qty 1.7

## 2015-08-06 NOTE — Progress Notes (Signed)
[place] [phone]   [phone]  OFFICE PROGRESS NOTE  Walker Kehr, MD [place] [address]  DIAGNOSIS AND STAGE: Metastatic breast adenocarcinoma, presenting with bone metastasis in the left hip area. This was initially diagnosed as stage II (T2, N1, M0) invasive right breast carcinoma with positive estrogen and progesterone receptors and HER-2/neu positive in July 2002   PRIOR THERAPY:  1. Status post right lumpectomy with right axillary lymph node dissection revealing 4/9 lymph nodes that were positive for malignancy.  2. Status post 8 cycles of systemic chemotherapy with CMF, completed March 2003.  3. Status post radiotherapy to the remaining right breast under the care of Dr. Tammi Klippel.  4. Status post 2 years of treatment with tamoxifen, started July 2003, then switched to Femara by Dr. Humphrey Rolls in August 2005. The patient discontinued the treatment in February 2009.  5. Status post left total hip replacement on Apr 01, 2011, secondary to pathologic left femoral neck fracture. 6. Zometa 4 mg IV on 3 monthly basis. Discontinued secondary to renal insufficiency.   CURRENT THERAPY:  1. Arimidex 1 mg p.o. daily, started 03/18/2011.  2. Xgeva 120 mcg subcutaneously on 3 monthly basis.  CHEMOTHERAPY INTENT: Palliative  CURRENT # OF HORMONAL THERAPY CYCLES: 56 CURRENT ANTIEMETICS: None  CURRENT SMOKING STATUS: Nonsmoker  ORAL HORMONAL THERAPY AND CONSENT: Yes.  CURRENT BISPHOSPHONATES USE: Zometa with stable renal function.  PAIN MANAGEMENT: Tramadol when necessary  NARCOTICS INDUCED CONSTIPATION: None  LIVING WILL AND CODE STATUS: Full code.   INTERVAL HISTORY:  [name] [name] 74 y.o. female returns to the clinic today for followup visit. The patient was last seen in February 2016. She missed several of her follow-up appointment as well as the Xgeva injection. She is feeling fine today with no specific complaints except  severe pain in the right knee. She was seen by her primary care physician recently and had a steroid injection was no significant improvement. She was seen in the past. Dr. Alvan Dame for hip replacement. She has no significant weight loss or night sweats. She has no nausea or vomiting. The patient denied having any significant chest pain, but has shortness of breath with exertion with no cough or hemoptysis. She is tolerating her treatment with Arimidex fairly well. She was also on treatment with Xgeva every 3 months. She is here today for reevaluation and to resume Xgeva injection.  MEDICAL HISTORY: Past Medical History  Diagnosis Date  . Asthma   . Hypertension   . Cerebrovascular accident [facility])     R thalamic CVA 01/2010  . Hyperlipemia   . Dyslipidemia   . TIA (transient ischemic attack)     x2 in 2011  . Low back pain   . Osteoarthritis   . Anxiety   . Breast cancer ([place])     2002; metastatic in 2012  . Diabetes mellitus type II     pt.states she's not diabetic    ALLERGIES:  is allergic to atorvastatin.  MEDICATIONS:  Current Outpatient Prescriptions  Medication Sig Dispense Refill  . acetaminophen-codeine (TYLENOL #2) 300-15 MG per tablet Take 1-2 tablets by mouth every 4 (four) hours as needed for severe pain. 60 tablet 1  . amLODipine (NORVASC) 10 MG tablet Take 1 tablet (10 mg total) by mouth daily. 90 tablet 3  . anastrozole (ARIMIDEX) 1 MG tablet Take 1 tablet (1 mg total) by mouth daily. 90 tablet 2  . Calcium  Carbonate-Vit D-Min (CALCIUM 1200 PO) Take by mouth. Calcium chews, takes 2 daily    . Cholecalciferol (VITAMIN D3) 1000 UNITS CAPS Take 1 capsule by mouth daily.     . Diclofenac Sodium 2 % SOLN Apply 1 pump twice daily. 112 g 3  . Fluticasone-Salmeterol (ADVAIR DISKUS) 100-50 MCG/DOSE AEPB Inhale 1 puff into the lungs 2 (two) times daily. 1 each 11  . losartan-hydrochlorothiazide (HYZAAR) 100-25 MG per tablet Take 1 tablet by mouth daily. 90 tablet 3  . meclizine  (ANTIVERT) 12.5 MG tablet Take 1 tablet (12.5 mg total) by mouth 2 (two) times daily as needed for dizziness. 10 tablet 0  . meloxicam (MOBIC) 7.5 MG tablet Take 1-2 tablets (7.5-15 mg total) by mouth daily as needed for pain. 60 tablet 3  . omeprazole (PRILOSEC) 40 MG capsule Take 1 capsule (40 mg total) by mouth daily. 30 capsule 11  . acetaminophen (TYLENOL) 500 MG tablet Take 1,000 mg by mouth every 6 (six) hours as needed for mild pain or moderate pain.    . Naproxen Sodium (ALEVE) 220 MG CAPS Take 1-2 capsules by mouth daily as needed (for pain).      No current facility-administered medications for this visit.    SURGICAL HISTORY:  Past Surgical History  Procedure Laterality Date  . Breast lumpectomy      Right breast 2002  . Joint replacement      L hip due to breast ca met    REVIEW OF SYSTEMS:  A comprehensive review of systems was negative except for: Musculoskeletal: positive for arthralgias and bone pain   PHYSICAL EXAMINATION: General appearance: alert, cooperative, fatigued and no distress Head: Normocephalic, without obvious abnormality, atraumatic Neck: no adenopathy, no JVD, supple, symmetrical, trachea midline and thyroid not enlarged, symmetric, no tenderness/mass/nodules Lymph nodes: Cervical, supraclavicular, and axillary nodes normal. Resp: clear to auscultation bilaterally Back: symmetric, no curvature. ROM normal. No CVA tenderness. Cardio: regular rate and rhythm, S1, S2 normal, no murmur, click, rub or gallop GI: soft, non-tender; bowel sounds normal; no masses,  no organomegaly Extremities: extremities normal, atraumatic, no cyanosis or edema Neurologic: Alert and oriented X 3, normal strength and tone. Normal symmetric reflexes. Normal coordination and gait  ECOG PERFORMANCE STATUS: 1 - Symptomatic but completely ambulatory  Blood pressure 157/76, pulse 76, temperature 98.6 F (37 C), temperature source Oral, resp. rate 18, height _0  (1.6 m), weight  214 lb 1.6 oz (97.115 kg), SpO2 98 %.  LABORATORY DATA: Lab Results  Component Value Date   WBC 6.4 12/12/2014   HGB 11.6 12/12/2014   HCT 36.1 12/12/2014   MCV 93.3 12/12/2014   PLT 239 12/12/2014      Chemistry      Component Value Date/Time   NA 142 12/12/2014 0823   NA 139 06/29/2014 0835   NA 140 05/03/2012 1449   K 3.7 12/12/2014 0823   K 3.8 06/29/2014 0835   K 4.3 05/03/2012 1449   CL 105 06/29/2014 0835   CL 109* 04/25/2013 1023   CL 99 05/03/2012 1449   CO2 25 12/12/2014 0823   CO2 22 06/29/2014 0835   CO2 27 05/03/2012 1449   BUN 10.3 12/12/2014 0823   BUN 12 06/29/2014 0835   BUN 14 05/03/2012 1449   CREATININE 1.3* 12/12/2014 0823   CREATININE 0.98 06/29/2014 0835   CREATININE 1.1 05/03/2012 1449      Component Value Date/Time   CALCIUM 9.5 12/12/2014 0823   CALCIUM 8.9 06/29/2014 0835  CALCIUM 9.1 05/03/2012 1449   ALKPHOS 99 12/12/2014 0823   ALKPHOS 95 06/29/2014 0835   ALKPHOS 78 05/03/2012 1449   AST 13 12/12/2014 0823   AST 14 06/29/2014 0835   AST 19 05/03/2012 1449   ALT 8 12/12/2014 0823   ALT 8 06/29/2014 0835   ALT 14 05/03/2012 1449   BILITOT 0.49 12/12/2014 0823   BILITOT 0.4 06/29/2014 0835   BILITOT 0.50 05/03/2012 1449      RADIOGRAPHIC STUDIES: No results found. ASSESSMENT AND PLAN: This is a very pleasant 74 years old African American female with metastatic breast adenocarcinoma with bone metastasis. The patient is currently on hormonal therapy with Arimidex with no significant evidence for disease progression.  I will arrange for the patient to have repeat CBC, complete metabolic panel and CA 35.33 today. I recommended for the patient to continue her current treatment with Arimidex  The patient will also continue her current treatment with Xgeva 120 mcg subcutaneously every 3 months. She will receive a dose today. She will come back for follow-up visit in 3 months with repeat blood work as well as CT scan of the chest,  abdomen and pelvis and bone scan for restaging of her disease. Regarding the right knee pain, I strongly recommended for the patient to see Dr. Alvan Dame for reevaluation. CODE STATUS was discussed with the patient and she is currently full code. She was advised to call immediately if she has any concerning symptoms in the interval. The patient voices understanding of current disease status and treatment options and is in agreement with the current care plan.  All questions were answered. The patient knows to call the clinic with any problems, questions or concerns. We can certainly see the patient much sooner if necessary.  Disclaimer: This note was dictated with voice recognition software. Similar sounding words can inadvertently be transcribed and may not be corrected upon review.

## 2015-08-06 NOTE — Telephone Encounter (Signed)
Patient sent back to lab and for injection. Patient given avs report and appointments for January 2017. [facility] will call with scan appointments - patient aware.

## 2015-08-07 LAB — CANCER ANTIGEN 27.29: CA 27.29: 38 U/mL (ref 0–39)

## 2015-09-08 ENCOUNTER — Ambulatory Visit: Payer: Medicare Other | Admitting: Internal Medicine

## 2015-10-03 ENCOUNTER — Other Ambulatory Visit: Payer: Self-pay | Admitting: *Deleted

## 2015-10-03 NOTE — H&P (Signed)
TOTAL HIP ADMISSION H&P  Patient is admitted for right total hip arthroplasty, anterior approach.  Subjective:  Chief Complaint:    Right hip primary OA / pain  HPI: [name] [name], 74 y.o. female, has a history of pain and functional disability in the right hip(s) due to arthritis and patient has failed non-surgical conservative treatments for greater than 12 weeks to include NSAID's and/or analgesics, use of assistive devices and activity modification.  Onset of symptoms was gradual starting  years ago with rapidlly worsening course since that time.The patient noted prior procedures of the hip to include arthroplasty on the left hip for an impending pathologic fracture on 04/01/2011 per Dr. Alvan Dame.  Patient currently rates pain in the right hip at 8 out of 10 with activity. Patient has worsening of pain with activity and weight bearing, trendelenberg gait, pain that interfers with activities of daily living and pain with passive range of motion. Patient has evidence of periarticular osteophytes and joint space narrowing by imaging studies. This condition presents safety issues increasing the risk of falls.   There is no current active infection.  Risks, benefits and expectations were discussed with the patient.  Risks including but not limited to the risk of anesthesia, blood clots, nerve damage, blood vessel damage, failure of the prosthesis, infection and up to and including death.  Patient understand the risks, benefits and expectations and wishes to proceed with surgery.   PCP: Walker Kehr, MD  D/C Plans:      Home with HHPT / SNF  Post-op Meds:       No Rx given   Tranexamic Acid:      To be given - IV  Decadron:      Is to be given  FYI:     ASA post-op  Norco post-op    Patient Active Problem List   Diagnosis Date Noted  . Full code status 08/06/2015  . Knee pain, right 07/28/2015  . Osteoarthritis of right lower extremity 09/17/2014  . Cyst of lateral meniscus 08/27/2014  .  Cyst of lateral meniscus of right knee 08/27/2014  . Benign paroxysmal positional vertigo 07/12/2014  . Hypokalemia 07/06/2013  . Knee pain, right anterior 04/04/2013  . Creatinine elevation 04/04/2013  . Dyspnea 05/12/2012  . GERD (gastroesophageal reflux disease) 05/12/2012  . Secondary diabetes mellitus with renal manifestations 02/11/2012  . Breast cancer metastasized to bone ([place]) 04/23/2011  . Edema 04/23/2011  . ANXIETY 11/03/2010  . OSTEOARTHRITIS 10/21/2010  . Lumbago 10/21/2010  . HIP PAIN 10/07/2010  . NAUSEA AND VOMITING 08/26/2010  . HYPERLIPIDEMIA 02/18/2010  . CEREBROVASCULAR ACCIDENT, HX OF 02/18/2010  . WEIGHT GAIN, ABNORMAL 12/19/2009  . LEG PAIN, RIGHT 04/16/2009  . VITAMIN D DEFICIENCY 03/07/2009  . Hidradenitis 04/12/2008  . FATIGUE 10/03/2007  . Rash and other nonspecific skin eruption 10/03/2007  . 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 Essential hypertension 10/02/2007  . Asthma 10/02/2007   Past Medical History  Diagnosis Date  . Asthma   . Hypertension   . Cerebrovascular accident [facility])     R thalamic CVA 01/2010  . Hyperlipemia   . Dyslipidemia   . TIA (transient ischemic attack)     x2 in 2011  . Low back pain   . Osteoarthritis   . Anxiety   . Breast cancer ([place])     2002; metastatic in 2012  . Diabetes mellitus type II     pt.states she's not diabetic    Past Surgical History  Procedure Laterality Date  .  Breast lumpectomy      Right breast 2002  . Joint replacement      L hip due to breast ca met    No prescriptions prior to admission   Allergies  Allergen Reactions  . Atorvastatin Other (See Comments)    REACTION: tired    Social History  Substance Use Topics  . Smoking status: Former Smoker    Quit date: 02/12/2011  . Smokeless tobacco: Not on file  . Alcohol Use: Yes    Family History  Problem Relation Age of Onset  . Hypertension Mother   . Hypertension Father      Review of Systems  Constitutional: Positive for  malaise/fatigue.  HENT: Negative.   Eyes: Negative.   Respiratory: Negative.   Cardiovascular: Negative.   Gastrointestinal: Positive for heartburn.  Genitourinary: Negative.   Musculoskeletal: Positive for joint pain.  Skin: Negative.   Neurological: Negative.   Endo/Heme/Allergies: Negative.   Psychiatric/Behavioral: The patient is nervous/anxious.     Objective:  Physical Exam  Constitutional: She is oriented to person, place, and time. She appears well-developed.  HENT:  Head: Normocephalic.  Eyes: Pupils are equal, round, and reactive to light.  Neck: Neck supple. No JVD present. No tracheal deviation present. No thyromegaly present.  Cardiovascular: Normal rate, regular rhythm, normal heart sounds and intact distal pulses.   Respiratory: Effort normal and breath sounds normal. No stridor. No respiratory distress. She has no wheezes.  GI: Soft. There is no tenderness. There is no guarding.  Musculoskeletal:       Right hip: She exhibits decreased range of motion, decreased strength, tenderness and bony tenderness. She exhibits no swelling, no deformity and no laceration.  Lymphadenopathy:    She has no cervical adenopathy.  Neurological: She is alert and oriented to person, place, and time.  Skin: Skin is warm and dry.  Psychiatric: She has a normal mood and affect.     Labs:  Estimated body mass index is 37.94 kg/(m^2) as calculated from the following:   Height as of 08/06/15: _0  (1.6 m).   Weight as of 08/06/15: 97.115 kg (214 lb 1.6 oz).   Imaging Review Plain radiographs demonstrate severe degenerative joint disease of the right hip(s). The bone quality appears to be good for age and reported activity level.  Assessment/Plan:  End stage arthritis, right hip(s)  The patient history, physical examination, clinical judgement of the provider and imaging studies are consistent with end stage degenerative joint disease of the right hip(s) and total hip  arthroplasty is deemed medically necessary. The treatment options including medical management, injection therapy, arthroscopy and arthroplasty were discussed at length. The risks and benefits of total hip arthroplasty were presented and reviewed. The risks due to aseptic loosening, infection, stiffness, dislocation/subluxation,  thromboembolic complications and other imponderables were discussed.  The patient acknowledged the explanation, agreed to proceed with the plan and consent was signed. Patient is being admitted for inpatient treatment for surgery, pain control, PT, OT, prophylactic antibiotics, VTE prophylaxis, progressive ambulation and ADL's and discharge planning.The patient is planning to be discharged home with home health services.      [place] Audon Heymann   PA-C  10/03/2015, 11:54 AM

## 2015-10-06 ENCOUNTER — Telehealth: Payer: Self-pay | Admitting: Medical Oncology

## 2015-10-06 NOTE — Telephone Encounter (Signed)
I returned pt call. Has right hip replacement scheduled on 10/27/18. Appt with you 1/9. Cannot make CT , bone scan appts in Jan . Note to [facility]

## 2015-10-07 ENCOUNTER — Telehealth: Payer: Self-pay | Admitting: Medical Oncology

## 2015-10-07 NOTE — Telephone Encounter (Signed)
-----   Message from Curt Bears, MD sent at 10/06/2015  4:36 PM EST ----- Regarding: RE: r/s CT scan due to hip replacement Reschedule scan and visit 2-3 weeks later ----- Message -----    From: Ardeen Garland, RN    Sent: 10/06/2015  11:30 AM      To: Curt Bears, MD Subject: r/s CT scan due to hip replacement             Has right hip replacement scheduled on 10/27/18. Cannot make CT , bone scan appts in Jan . Has a appt with you 1/9.  When so you want R/S her appts -?feb

## 2015-10-07 NOTE — Telephone Encounter (Signed)
Pt notified of r/s scan appt from dec to feb due to hip replacement surgery recovery.

## 2015-10-08 ENCOUNTER — Telehealth: Payer: Self-pay | Admitting: Internal Medicine

## 2015-10-08 NOTE — Telephone Encounter (Signed)
s.w. pt and advised on Jan 2017 cx and moved to Feb 2017.Marland KitchenMarland KitchenMarland Kitchenpt ok and aware

## 2015-10-15 ENCOUNTER — Other Ambulatory Visit (HOSPITAL_COMMUNITY): Payer: Self-pay | Admitting: *Deleted

## 2015-10-15 NOTE — Patient Instructions (Addendum)
[name] [name]  10/15/2015   Your procedure is scheduled on: 10-28-15  Report to [facility]  Entrance take [facility]  elevators to 3rd floor to  [place] at 515 AM.  Call this number if you have problems the morning of surgery [phone]   Remember: ONLY 1 PERSON MAY GO WITH YOU TO SHORT STAY TO GET  READY MORNING OF [place].  Do not eat food or drink liquids :After Midnight.     Take these medicines the morning of surgery with A SIP OF WATER: AMLODIPINE, ARMIDEX                               You may not have any metal on your body including hair pins and              piercings  Do not wear jewelry, make-up, lotions, powders or perfumes, deodorant             Do not wear nail polish.  Do not shave  48 hours prior to surgery.              Men may shave face and neck.   Do not bring valuables to the hospital. [place].  Contacts, dentures or bridgework may not be worn into surgery.  Leave suitcase in the car. After surgery it may be brought to your room.     _____________________________________________________________________             [facility] - Preparing for Surgery Before surgery, you can play an important role.  Because skin is not sterile, your skin needs to be as free of germs as possible.  You can reduce the number of germs on your skin by washing with CHG (chlorahexidine gluconate) soap before surgery.  CHG is an antiseptic cleaner which kills germs and bonds with the skin to continue killing germs even after washing. Please DO NOT use if you have an allergy to CHG or antibacterial soaps.  If your skin becomes reddened/irritated stop using the CHG and inform your nurse when you arrive at Short Stay. Do not shave (including legs and underarms) for at least 48 hours prior to the first CHG shower.  You may shave your face/neck. Please follow these instructions carefully:  1.   Shower with CHG Soap the night before surgery and the  morning of Surgery.  2.  If you choose to wash your hair, wash your hair first as usual with your  normal  shampoo.  3.  After you shampoo, rinse your hair and body thoroughly to remove the  shampoo.                           4.  Use CHG as you would any other liquid soap.  You can apply chg directly  to the skin and wash                       Gently with a scrungie or clean washcloth.  5.  Apply the CHG Soap to your body ONLY FROM THE NECK DOWN.   Do not use on face/ open  Wound or open sores. Avoid contact with eyes, ears mouth and genitals (private parts).                       Wash face,  Genitals (private parts) with your normal soap.             6.  Wash thoroughly, paying special attention to the area where your surgery  will be performed.  7.  Thoroughly rinse your body with warm water from the neck down.  8.  DO NOT shower/wash with your normal soap after using and rinsing off  the CHG Soap.                9.  Pat yourself dry with a clean towel.            10.  Wear clean pajamas.            11.  Place clean sheets on your bed the night of your first shower and do not  sleep with pets. Day of Surgery : Do not apply any lotions/deodorants the morning of surgery.  Please wear clean clothes to the hospital/surgery center.  FAILURE TO FOLLOW THESE INSTRUCTIONS MAY RESULT IN THE CANCELLATION OF YOUR SURGERY PATIENT SIGNATURE_________________________________  NURSE SIGNATURE__________________________________  ________________________________________________________________________   Adam Phenix  An incentive spirometer is a tool that can help keep your lungs clear and active. This tool measures how well you are filling your lungs with each breath. Taking long deep breaths may help reverse or decrease the chance of developing breathing (pulmonary) problems (especially infection) following:  A long  period of time when you are unable to move or be active. BEFORE THE PROCEDURE   If the spirometer includes an indicator to show your best effort, your nurse or respiratory therapist will set it to a desired goal.  If possible, sit up straight or lean slightly forward. Try not to slouch.  Hold the incentive spirometer in an upright position. INSTRUCTIONS FOR USE   Sit on the edge of your bed if possible, or sit up as far as you can in bed or on a chair.  Hold the incentive spirometer in an upright position.  Breathe out normally.  Place the mouthpiece in your mouth and seal your lips tightly around it.  Breathe in slowly and as deeply as possible, raising the piston or the ball toward the top of the column.  Hold your breath for 3-5 seconds or for as long as possible. Allow the piston or ball to fall to the bottom of the column.  Remove the mouthpiece from your mouth and breathe out normally.  Rest for a few seconds and repeat Steps 1 through 7 at least 10 times every 1-2 hours when you are awake. Take your time and take a few normal breaths between deep breaths.  The spirometer may include an indicator to show your best effort. Use the indicator as a goal to work toward during each repetition.  After each set of 10 deep breaths, practice coughing to be sure your lungs are clear. If you have an incision (the cut made at the time of surgery), support your incision when coughing by placing a pillow or rolled up towels firmly against it. Once you are able to get out of bed, walk around indoors and cough well. You may stop using the incentive spirometer when instructed by your caregiver.  RISKS AND COMPLICATIONS  Take your time so you do not get  dizzy or light-headed.  If you are in pain, you may need to take or ask for pain medication before doing incentive spirometry. It is harder to take a deep breath if you are having pain. AFTER USE  Rest and breathe slowly and easily.  It can  be helpful to keep track of a log of your progress. Your caregiver can provide you with a simple table to help with this. If you are using the spirometer at home, follow these instructions: [place] IF:   You are having difficultly using the spirometer.  You have trouble using the spirometer as often as instructed.  Your pain medication is not giving enough relief while using the spirometer.  You develop fever of 100.5 F (38.1 C) or higher. SEEK IMMEDIATE MEDICAL CARE IF:   You cough up bloody sputum that had not been present before.  You develop fever of 102 F (38.9 C) or greater.  You develop worsening pain at or near the incision site. MAKE SURE YOU:   Understand these instructions.  Will watch your condition.  Will get help right away if you are not doing well or get worse. Document Released: 02/28/2007 Document Revised: 01/10/2012 Document Reviewed: 05/01/2007 ExitCare Patient Information 2014 ExitCare, [place].   ________________________________________________________________________  WHAT IS A BLOOD TRANSFUSION? Blood Transfusion Information  A transfusion is the replacement of blood or some of its parts. Blood is made up of multiple cells which provide different functions.  Red blood cells carry oxygen and are used for blood loss replacement.  White blood cells fight against infection.  Platelets control bleeding.  Plasma helps clot blood.  Other blood products are available for specialized needs, such as hemophilia or other clotting disorders. BEFORE THE TRANSFUSION  Who gives blood for transfusions?   Healthy volunteers who are fully evaluated to make sure their blood is safe. This is blood bank blood. Transfusion therapy is the safest it has ever been in the practice of medicine. Before blood is taken from a donor, a complete history is taken to make sure that person has no history of diseases nor engages in risky social behavior (examples are  intravenous drug use or sexual activity with multiple partners). The donor's travel history is screened to minimize risk of transmitting infections, such as malaria. The donated blood is tested for signs of infectious diseases, such as HIV and hepatitis. The blood is then tested to be sure it is compatible with you in order to minimize the chance of a transfusion reaction. If you or a relative donates blood, this is often done in anticipation of surgery and is not appropriate for emergency situations. It takes many days to process the donated blood. RISKS AND COMPLICATIONS Although transfusion therapy is very safe and saves many lives, the main dangers of transfusion include:   Getting an infectious disease.  Developing a transfusion reaction. This is an allergic reaction to something in the blood you were given. Every precaution is taken to prevent this. The decision to have a blood transfusion has been considered carefully by your caregiver before blood is given. Blood is not given unless the benefits outweigh the risks. AFTER THE TRANSFUSION  Right after receiving a blood transfusion, you will usually feel much better and more energetic. This is especially true if your red blood cells have gotten low (anemic). The transfusion raises the level of the red blood cells which carry oxygen, and this usually causes an energy increase.  The nurse administering the transfusion will  monitor you carefully for complications. HOME CARE INSTRUCTIONS  No special instructions are needed after a transfusion. You may find your energy is better. Speak with your caregiver about any limitations on activity for underlying diseases you may have. SEEK MEDICAL CARE IF:   Your condition is not improving after your transfusion.  You develop redness or irritation at the intravenous (IV) site. SEEK IMMEDIATE MEDICAL CARE IF:  Any of the following symptoms occur over the next 12 hours:  Shaking chills.  You have a  temperature by mouth above 102 F (38.9 C), not controlled by medicine.  Chest, back, or muscle pain.  People around you feel you are not acting correctly or are confused.  Shortness of breath or difficulty breathing.  Dizziness and fainting.  You get a rash or develop hives.  You have a decrease in urine output.  Your urine turns a dark color or changes to pink, red, or brown. Any of the following symptoms occur over the next 10 days:  You have a temperature by mouth above 102 F (38.9 C), not controlled by medicine.  Shortness of breath.  Weakness after normal activity.  The white part of the eye turns yellow (jaundice).  You have a decrease in the amount of urine or are urinating less often.  Your urine turns a dark color or changes to pink, red, or brown. Document Released: 10/15/2000 Document Revised: 01/10/2012 Document Reviewed: 06/03/2008 [facility] Patient Information 2014 [place], [place].  _______________________________________________________________________

## 2015-10-17 ENCOUNTER — Encounter (HOSPITAL_COMMUNITY)
Admission: RE | Admit: 2015-10-17 | Discharge: 2015-10-17 | Disposition: A | Discharge status: Home / Self Care | Payer: Medicare Other | Source: Ambulatory Visit | Attending: Orthopedic Surgery | Admitting: Orthopedic Surgery

## 2015-10-17 ENCOUNTER — Encounter (HOSPITAL_COMMUNITY): Payer: Self-pay

## 2015-10-17 DIAGNOSIS — M1611 Unilateral primary osteoarthritis, right hip: Secondary | ICD-10-CM | POA: Insufficient documentation

## 2015-10-17 DIAGNOSIS — Z01812 Encounter for preprocedural laboratory examination: Secondary | ICD-10-CM | POA: Diagnosis not present

## 2015-10-17 DIAGNOSIS — Z01810 Encounter for preprocedural cardiovascular examination: Secondary | ICD-10-CM | POA: Insufficient documentation

## 2015-10-17 HISTORY — DX: Dizziness and giddiness: R42

## 2015-10-17 LAB — SURGICAL PCR SCREEN
MRSA, PCR: NEGATIVE
Staphylococcus aureus: NEGATIVE

## 2015-10-17 LAB — CBC
HEMATOCRIT: 38.4 % (ref 36.0–46.0)
HEMOGLOBIN: 12.3 g/dL (ref 12.0–15.0)
MCH: 28.5 pg (ref 26.0–34.0)
MCHC: 32 g/dL (ref 30.0–36.0)
MCV: 89.1 fL (ref 78.0–100.0)
Platelets: 249 10*3/uL (ref 150–400)
RBC: 4.31 MIL/uL (ref 3.87–5.11)
RDW: 15.4 % (ref 11.5–15.5)
WBC: 5.2 10*3/uL (ref 4.0–10.5)

## 2015-10-17 LAB — URINE MICROSCOPIC-ADD ON

## 2015-10-17 LAB — BASIC METABOLIC PANEL
Anion gap: 9 (ref 5–15)
BUN: 17 mg/dL (ref 6–20)
CHLORIDE: 106 mmol/L (ref 101–111)
CO2: 26 mmol/L (ref 22–32)
Calcium: 8.9 mg/dL (ref 8.9–10.3)
Creatinine, Ser: 1.09 mg/dL — ABNORMAL HIGH (ref 0.44–1.00)
GFR calc non Af Amer: 49 mL/min — ABNORMAL LOW (ref >60)
GFR, EST AFRICAN AMERICAN: 57 mL/min — AB (ref >60)
Glucose, Bld: 100 mg/dL — ABNORMAL HIGH (ref 65–99)
POTASSIUM: 4.7 mmol/L (ref 3.5–5.1)
SODIUM: 141 mmol/L (ref 135–145)

## 2015-10-17 LAB — URINALYSIS, ROUTINE W REFLEX MICROSCOPIC
BILIRUBIN URINE: NEGATIVE
Glucose, UA: NEGATIVE mg/dL
Ketones, ur: NEGATIVE mg/dL
Nitrite: NEGATIVE
PROTEIN: 30 mg/dL — AB
SPECIFIC GRAVITY, URINE: 1.026 (ref 1.005–1.030)
pH: 6.5 (ref 5.0–8.0)

## 2015-10-17 LAB — TYPE AND SCREEN
ABO/RH(D): O POS
ANTIBODY SCREEN: NEGATIVE

## 2015-10-17 LAB — PROTIME-INR
INR: 1.07 (ref 0.00–1.49)
Prothrombin Time: 14.1 seconds (ref 11.6–15.2)

## 2015-10-17 LAB — APTT: aPTT: 31 seconds (ref 24–37)

## 2015-10-17 LAB — ABO/RH: ABO/RH(D): O POS

## 2015-10-17 NOTE — Progress Notes (Signed)
Micro ua results faxed by epic to dr Alvan Dame

## 2015-10-21 NOTE — Progress Notes (Signed)
Fax received and placed on chart from dr Alvan Dame cipro 500 mg bid x 5 days called into [facility] pharmacy

## 2015-10-27 NOTE — Anesthesia Preprocedure Evaluation (Addendum)
Anesthesia Evaluation  Patient identified by MRN, date of birth, ID band Patient awake    Reviewed: Allergy & Precautions, NPO status , Patient's Chart, lab work & pertinent test results  Airway Mallampati: III  TM Distance: >3 FB Neck ROM: Full    Dental  (+) Edentulous Upper, Edentulous Lower   Pulmonary asthma , former smoker,    breath sounds clear to auscultation       Cardiovascular hypertension, Pt. on medications  Rhythm:Regular Rate:Normal     Neuro/Psych PSYCHIATRIC DISORDERS Anxiety TIACVA    GI/Hepatic GERD  ,  Endo/Other  diabetes  Renal/GU Renal disease  negative genitourinary   Musculoskeletal  (+) Arthritis ,   Abdominal   Peds negative pediatric ROS (+)  Hematology negative hematology ROS (+)   Anesthesia Other Findings   Reproductive/Obstetrics negative OB ROS                           Lab Results  Component Value Date   WBC 5.2 10/17/2015   HGB 12.3 10/17/2015   HCT 38.4 10/17/2015   MCV 89.1 10/17/2015   PLT 249 10/17/2015   Lab Results  Component Value Date   INR 1.07 10/17/2015   INR 1.10 04/05/2011   INR 1.14 04/04/2011   EKG: normal EKG, normal sinus rhythm.    Anesthesia Physical Anesthesia Plan  ASA: III  Anesthesia Plan: General   Post-op Pain Management:    Induction: Intravenous  Airway Management Planned: Oral ETT  Additional Equipment:   Intra-op Plan:   Post-operative Plan: Extubation in OR  Informed Consent: I have reviewed the patients History and Physical, chart, labs and discussed the procedure including the risks, benefits and alternatives for the proposed anesthesia with the patient or authorized representative who has indicated his/her understanding and acceptance.     Plan Discussed with: CRNA  Anesthesia Plan Comments: (Pt refused spinal, will proceed with GA.   Ms. Montreuil did not take her BP med this AM due to the  "pill being too large" and she did not want to drink too much water. )      Anesthesia Quick Evaluation

## 2015-10-28 ENCOUNTER — Encounter (HOSPITAL_COMMUNITY)
Admission: AD | Disposition: A | Discharge status: Skilled Nursing Facility | Payer: Self-pay | Source: Ambulatory Visit | Attending: Orthopedic Surgery

## 2015-10-28 ENCOUNTER — Inpatient Hospital Stay (HOSPITAL_COMMUNITY): Payer: Medicare Other | Admitting: Anesthesiology

## 2015-10-28 ENCOUNTER — Encounter (HOSPITAL_COMMUNITY): Payer: Self-pay | Admitting: *Deleted

## 2015-10-28 ENCOUNTER — Inpatient Hospital Stay (HOSPITAL_COMMUNITY): Payer: Medicare Other

## 2015-10-28 ENCOUNTER — Inpatient Hospital Stay (HOSPITAL_COMMUNITY)
Admission: AD | Admit: 2015-10-28 | Discharge: 2015-10-30 | DRG: 470 | Disposition: A | Discharge status: Skilled Nursing Facility | Payer: Medicare Other | Source: Ambulatory Visit | Attending: Orthopedic Surgery | Admitting: Orthopedic Surgery

## 2015-10-28 DIAGNOSIS — E785 Hyperlipidemia, unspecified: Secondary | ICD-10-CM | POA: Diagnosis present

## 2015-10-28 DIAGNOSIS — E669 Obesity, unspecified: Secondary | ICD-10-CM | POA: Diagnosis present

## 2015-10-28 DIAGNOSIS — Z96642 Presence of left artificial hip joint: Secondary | ICD-10-CM | POA: Diagnosis present

## 2015-10-28 DIAGNOSIS — J45909 Unspecified asthma, uncomplicated: Secondary | ICD-10-CM | POA: Diagnosis present

## 2015-10-28 DIAGNOSIS — Z79899 Other long term (current) drug therapy: Secondary | ICD-10-CM

## 2015-10-28 DIAGNOSIS — K219 Gastro-esophageal reflux disease without esophagitis: Secondary | ICD-10-CM | POA: Diagnosis present

## 2015-10-28 DIAGNOSIS — Z6836 Body mass index (BMI) 36.0-36.9, adult: Secondary | ICD-10-CM

## 2015-10-28 DIAGNOSIS — Z87891 Personal history of nicotine dependence: Secondary | ICD-10-CM

## 2015-10-28 DIAGNOSIS — M1611 Unilateral primary osteoarthritis, right hip: Secondary | ICD-10-CM | POA: Diagnosis present

## 2015-10-28 DIAGNOSIS — Z96649 Presence of unspecified artificial hip joint: Secondary | ICD-10-CM

## 2015-10-28 DIAGNOSIS — Z01812 Encounter for preprocedural laboratory examination: Secondary | ICD-10-CM

## 2015-10-28 DIAGNOSIS — M25551 Pain in right hip: Secondary | ICD-10-CM | POA: Diagnosis present

## 2015-10-28 DIAGNOSIS — F419 Anxiety disorder, unspecified: Secondary | ICD-10-CM | POA: Diagnosis present

## 2015-10-28 DIAGNOSIS — Z853 Personal history of malignant neoplasm of breast: Secondary | ICD-10-CM | POA: Diagnosis not present

## 2015-10-28 DIAGNOSIS — I10 Essential (primary) hypertension: Secondary | ICD-10-CM | POA: Diagnosis present

## 2015-10-28 DIAGNOSIS — Z8673 Personal history of transient ischemic attack (TIA), and cerebral infarction without residual deficits: Secondary | ICD-10-CM | POA: Diagnosis not present

## 2015-10-28 HISTORY — PX: TOTAL HIP ARTHROPLASTY: SHX124

## 2015-10-28 SURGERY — ARTHROPLASTY, HIP, TOTAL, ANTERIOR APPROACH
Anesthesia: General | Site: Hip | Laterality: Right

## 2015-10-28 MED ORDER — CEFAZOLIN SODIUM-DEXTROSE 2-3 GM-% IV SOLR
2.0000 g | INTRAVENOUS | Status: AC
  Administered 2015-10-28: 2 g via INTRAVENOUS

## 2015-10-28 MED ORDER — ONDANSETRON HCL 4 MG/2ML IJ SOLN
INTRAMUSCULAR | Status: AC
  Filled 2015-10-28: qty 2

## 2015-10-28 MED ORDER — ANASTROZOLE 1 MG PO TABS
1.0000 mg | ORAL_TABLET | Freq: Every day | ORAL | Status: DC
  Administered 2015-10-29 – 2015-10-30 (×2): 1 mg via ORAL
  Filled 2015-10-28 (×3): qty 1

## 2015-10-28 MED ORDER — DOCUSATE SODIUM 100 MG PO CAPS
100.0000 mg | ORAL_CAPSULE | Freq: Two times a day (BID) | ORAL | Status: DC
  Administered 2015-10-28 – 2015-10-30 (×5): 100 mg via ORAL

## 2015-10-28 MED ORDER — MENTHOL 3 MG MT LOZG: 1.0000 | LOZENGE | OROMUCOSAL | Status: DC | PRN

## 2015-10-28 MED ORDER — DEXAMETHASONE SODIUM PHOSPHATE 10 MG/ML IJ SOLN
INTRAMUSCULAR | Status: AC
  Filled 2015-10-28: qty 1

## 2015-10-28 MED ORDER — HYDROCHLOROTHIAZIDE 25 MG PO TABS
25.0000 mg | ORAL_TABLET | Freq: Every day | ORAL | Status: DC
  Administered 2015-10-28 – 2015-10-30 (×3): 25 mg via ORAL
  Filled 2015-10-28 (×3): qty 1

## 2015-10-28 MED ORDER — HYDROCODONE-ACETAMINOPHEN 7.5-325 MG PO TABS
1.0000 | ORAL_TABLET | ORAL | Status: DC
  Administered 2015-10-28 (×3): 1 via ORAL
  Administered 2015-10-29: 2 via ORAL
  Administered 2015-10-29: 1 via ORAL
  Administered 2015-10-29: 2 via ORAL
  Administered 2015-10-29: 1 via ORAL
  Administered 2015-10-30 (×2): 2 via ORAL
  Filled 2015-10-28 (×2): qty 1
  Filled 2015-10-28: qty 2
  Filled 2015-10-28: qty 1
  Filled 2015-10-28 (×2): qty 2
  Filled 2015-10-28: qty 1
  Filled 2015-10-28 (×2): qty 2
  Filled 2015-10-28: qty 1
  Filled 2015-10-28 (×2): qty 2

## 2015-10-28 MED ORDER — SODIUM CHLORIDE 0.9 % IJ SOLN
INTRAMUSCULAR | Status: AC
  Filled 2015-10-28: qty 10

## 2015-10-28 MED ORDER — STERILE WATER FOR IRRIGATION IR SOLN
Status: DC | PRN
  Administered 2015-10-28: 1000 mL

## 2015-10-28 MED ORDER — ALBUTEROL SULFATE (2.5 MG/3ML) 0.083% IN NEBU
INHALATION_SOLUTION | RESPIRATORY_TRACT | Status: AC
  Filled 2015-10-28: qty 3

## 2015-10-28 MED ORDER — PHENOL 1.4 % MT LIQD: 1.0000 | OROMUCOSAL | Status: DC | PRN

## 2015-10-28 MED ORDER — SODIUM CHLORIDE 0.9 % IV SOLN
1000.0000 mg | Freq: Once | INTRAVENOUS | Status: AC
  Administered 2015-10-28: 1000 mg via INTRAVENOUS
  Filled 2015-10-28: qty 10

## 2015-10-28 MED ORDER — ALUM & MAG HYDROXIDE-SIMETH 200-200-20 MG/5ML PO SUSP
30.0000 mL | ORAL | Status: DC | PRN
  Administered 2015-10-28 – 2015-10-29 (×2): 30 mL via ORAL
  Filled 2015-10-28 (×2): qty 30

## 2015-10-28 MED ORDER — FENTANYL CITRATE (PF) 100 MCG/2ML IJ SOLN
INTRAMUSCULAR | Status: AC
  Filled 2015-10-28: qty 2

## 2015-10-28 MED ORDER — NEOSTIGMINE METHYLSULFATE 10 MG/10ML IV SOLN
INTRAVENOUS | Status: DC | PRN
  Administered 2015-10-28: 3 mg via INTRAVENOUS

## 2015-10-28 MED ORDER — DEXTROSE 5 % IV SOLN
500.0000 mg | Freq: Four times a day (QID) | INTRAVENOUS | Status: DC | PRN
  Administered 2015-10-28: 500 mg via INTRAVENOUS
  Filled 2015-10-28 (×2): qty 5

## 2015-10-28 MED ORDER — SUCCINYLCHOLINE CHLORIDE 20 MG/ML IJ SOLN
INTRAMUSCULAR | Status: DC | PRN
  Administered 2015-10-28: 100 mg via INTRAVENOUS

## 2015-10-28 MED ORDER — LACTATED RINGERS IV SOLN: INTRAVENOUS | Status: DC

## 2015-10-28 MED ORDER — LACTATED RINGERS IV SOLN
INTRAVENOUS | Status: DC | PRN
  Administered 2015-10-28 (×2): via INTRAVENOUS

## 2015-10-28 MED ORDER — ROCURONIUM BROMIDE 100 MG/10ML IV SOLN
INTRAVENOUS | Status: DC | PRN
  Administered 2015-10-28: 25 mg via INTRAVENOUS

## 2015-10-28 MED ORDER — MECLIZINE HCL 12.5 MG PO TABS
12.5000 mg | ORAL_TABLET | Freq: Two times a day (BID) | ORAL | Status: DC | PRN
  Filled 2015-10-28: qty 1

## 2015-10-28 MED ORDER — HYDRALAZINE HCL 20 MG/ML IJ SOLN
INTRAMUSCULAR | Status: AC
  Filled 2015-10-28: qty 1

## 2015-10-28 MED ORDER — DEXAMETHASONE SODIUM PHOSPHATE 10 MG/ML IJ SOLN
10.0000 mg | Freq: Once | INTRAMUSCULAR | Status: AC
  Administered 2015-10-28: 10 mg via INTRAVENOUS

## 2015-10-28 MED ORDER — AMLODIPINE BESYLATE 10 MG PO TABS
10.0000 mg | ORAL_TABLET | Freq: Every day | ORAL | Status: DC
  Administered 2015-10-28 – 2015-10-30 (×3): 10 mg via ORAL
  Filled 2015-10-28 (×3): qty 1

## 2015-10-28 MED ORDER — LOSARTAN POTASSIUM-HCTZ 100-25 MG PO TABS: 1.0000 | ORAL_TABLET | Freq: Every day | ORAL | Status: DC

## 2015-10-28 MED ORDER — METOCLOPRAMIDE HCL 10 MG PO TABS: 5.0000 mg | ORAL_TABLET | Freq: Three times a day (TID) | ORAL | Status: DC | PRN

## 2015-10-28 MED ORDER — SODIUM CHLORIDE 0.9 % IR SOLN
Status: DC | PRN
  Administered 2015-10-28: 1000 mL

## 2015-10-28 MED ORDER — HYDRALAZINE HCL 20 MG/ML IJ SOLN
INTRAMUSCULAR | Status: DC | PRN
  Administered 2015-10-28 (×2): 5 mg via INTRAVENOUS

## 2015-10-28 MED ORDER — LABETALOL HCL 5 MG/ML IV SOLN
INTRAVENOUS | Status: AC
  Filled 2015-10-28: qty 4

## 2015-10-28 MED ORDER — SODIUM CHLORIDE 0.9 % IV SOLN
100.0000 mL/h | INTRAVENOUS | Status: DC
  Administered 2015-10-28: 100 mL/h via INTRAVENOUS
  Administered 2015-10-29: 30 mL/h via INTRAVENOUS
  Filled 2015-10-28 (×7): qty 1000

## 2015-10-28 MED ORDER — LOSARTAN POTASSIUM 50 MG PO TABS
100.0000 mg | ORAL_TABLET | Freq: Every day | ORAL | Status: DC
  Administered 2015-10-28 – 2015-10-30 (×3): 100 mg via ORAL
  Filled 2015-10-28 (×3): qty 2

## 2015-10-28 MED ORDER — METHOCARBAMOL 500 MG PO TABS
500.0000 mg | ORAL_TABLET | Freq: Four times a day (QID) | ORAL | Status: DC | PRN
  Administered 2015-10-29 – 2015-10-30 (×2): 500 mg via ORAL
  Filled 2015-10-28 (×3): qty 1

## 2015-10-28 MED ORDER — POLYETHYLENE GLYCOL 3350 17 G PO PACK
17.0000 g | PACK | Freq: Two times a day (BID) | ORAL | Status: DC
  Administered 2015-10-29 – 2015-10-30 (×3): 17 g via ORAL

## 2015-10-28 MED ORDER — GLYCOPYRROLATE 0.2 MG/ML IJ SOLN
INTRAMUSCULAR | Status: DC | PRN
  Administered 2015-10-28: 0.4 mg via INTRAVENOUS

## 2015-10-28 MED ORDER — METOCLOPRAMIDE HCL 5 MG/ML IJ SOLN: 5.0000 mg | Freq: Three times a day (TID) | INTRAMUSCULAR | Status: DC | PRN

## 2015-10-28 MED ORDER — DIPHENHYDRAMINE HCL 25 MG PO CAPS: 25.0000 mg | ORAL_CAPSULE | Freq: Four times a day (QID) | ORAL | Status: DC | PRN

## 2015-10-28 MED ORDER — MIDAZOLAM HCL 2 MG/2ML IJ SOLN
INTRAMUSCULAR | Status: AC
  Filled 2015-10-28: qty 2

## 2015-10-28 MED ORDER — PROPOFOL 10 MG/ML IV BOLUS
INTRAVENOUS | Status: AC
  Filled 2015-10-28: qty 20

## 2015-10-28 MED ORDER — CEFAZOLIN SODIUM-DEXTROSE 2-3 GM-% IV SOLR
2.0000 g | Freq: Four times a day (QID) | INTRAVENOUS | Status: AC
  Administered 2015-10-28 (×2): 2 g via INTRAVENOUS
  Filled 2015-10-28 (×2): qty 50

## 2015-10-28 MED ORDER — MEPERIDINE HCL 50 MG/ML IJ SOLN: 6.2500 mg | INTRAMUSCULAR | Status: DC | PRN

## 2015-10-28 MED ORDER — LIDOCAINE HCL (CARDIAC) 20 MG/ML IV SOLN
INTRAVENOUS | Status: DC | PRN
  Administered 2015-10-28: 100 mg via INTRATRACHEAL

## 2015-10-28 MED ORDER — MAGNESIUM CITRATE PO SOLN: 1.0000 | Freq: Once | ORAL | Status: DC | PRN

## 2015-10-28 MED ORDER — BISACODYL 10 MG RE SUPP: 10.0000 mg | Freq: Every day | RECTAL | Status: DC | PRN

## 2015-10-28 MED ORDER — PROPOFOL 10 MG/ML IV BOLUS
INTRAVENOUS | Status: DC | PRN
  Administered 2015-10-28: 160 mg via INTRAVENOUS

## 2015-10-28 MED ORDER — CELECOXIB 200 MG PO CAPS
200.0000 mg | ORAL_CAPSULE | Freq: Two times a day (BID) | ORAL | Status: DC
  Administered 2015-10-28 – 2015-10-30 (×4): 200 mg via ORAL
  Filled 2015-10-28 (×6): qty 1

## 2015-10-28 MED ORDER — ALBUTEROL SULFATE (2.5 MG/3ML) 0.083% IN NEBU
2.5000 mg | INHALATION_SOLUTION | Freq: Once | RESPIRATORY_TRACT | Status: AC
  Administered 2015-10-28: 2.5 mg via RESPIRATORY_TRACT

## 2015-10-28 MED ORDER — LABETALOL HCL 5 MG/ML IV SOLN
INTRAVENOUS | Status: DC | PRN
  Administered 2015-10-28 (×4): 5 mg via INTRAVENOUS

## 2015-10-28 MED ORDER — SODIUM CHLORIDE 3 % IN NEBU
INHALATION_SOLUTION | RESPIRATORY_TRACT | Status: AC
  Filled 2015-10-28: qty 15

## 2015-10-28 MED ORDER — DEXAMETHASONE SODIUM PHOSPHATE 10 MG/ML IJ SOLN
10.0000 mg | Freq: Once | INTRAMUSCULAR | Status: AC
  Administered 2015-10-29: 10 mg via INTRAVENOUS
  Filled 2015-10-28: qty 1

## 2015-10-28 MED ORDER — FERROUS SULFATE 325 (65 FE) MG PO TABS
325.0000 mg | ORAL_TABLET | Freq: Three times a day (TID) | ORAL | Status: DC
  Administered 2015-10-28: 325 mg via ORAL
  Filled 2015-10-28 (×8): qty 1

## 2015-10-28 MED ORDER — ONDANSETRON HCL 4 MG PO TABS: 4.0000 mg | ORAL_TABLET | Freq: Four times a day (QID) | ORAL | Status: DC | PRN

## 2015-10-28 MED ORDER — CEFAZOLIN SODIUM-DEXTROSE 2-3 GM-% IV SOLR
INTRAVENOUS | Status: AC
  Filled 2015-10-28: qty 50

## 2015-10-28 MED ORDER — FENTANYL CITRATE (PF) 100 MCG/2ML IJ SOLN: 25.0000 ug | INTRAMUSCULAR | Status: DC | PRN

## 2015-10-28 MED ORDER — FENTANYL CITRATE (PF) 100 MCG/2ML IJ SOLN
INTRAMUSCULAR | Status: DC | PRN
  Administered 2015-10-28: 50 ug via INTRAVENOUS
  Administered 2015-10-28: 100 ug via INTRAVENOUS
  Administered 2015-10-28 (×3): 50 ug via INTRAVENOUS

## 2015-10-28 MED ORDER — ASPIRIN EC 325 MG PO TBEC
325.0000 mg | DELAYED_RELEASE_TABLET | Freq: Two times a day (BID) | ORAL | Status: DC
  Administered 2015-10-29 – 2015-10-30 (×3): 325 mg via ORAL
  Filled 2015-10-28 (×5): qty 1

## 2015-10-28 MED ORDER — ONDANSETRON HCL 4 MG/2ML IJ SOLN
4.0000 mg | Freq: Four times a day (QID) | INTRAMUSCULAR | Status: DC | PRN
  Administered 2015-10-30: 4 mg via INTRAVENOUS
  Filled 2015-10-28: qty 2

## 2015-10-28 MED ORDER — HYDROMORPHONE HCL 1 MG/ML IJ SOLN
0.5000 mg | INTRAMUSCULAR | Status: DC | PRN
Start: 2015-10-28 — End: 2015-10-30
  Administered 2015-10-28: 1 mg via INTRAVENOUS
  Filled 2015-10-28: qty 1

## 2015-10-28 SURGICAL SUPPLY — 37 items
BAG DECANTER FOR FLEXI CONT (MISCELLANEOUS) IMPLANT
BAG SPEC THK2 15X12 ZIP CLS (MISCELLANEOUS)
BAG ZIPLOCK 12X15 (MISCELLANEOUS) IMPLANT
CAPT HIP TOTAL 2 ×2 IMPLANT
CLOTH BEACON ORANGE TIMEOUT ST (SAFETY) ×3 IMPLANT
COVER PERINEAL POST (MISCELLANEOUS) ×3 IMPLANT
DRAPE STERI IOBAN 125X83 (DRAPES) ×3 IMPLANT
DRAPE U-SHAPE 47X51 STRL (DRAPES) ×6 IMPLANT
DRSG AQUACEL AG ADV 3.5X10 (GAUZE/BANDAGES/DRESSINGS) ×3 IMPLANT
DURAPREP 26ML APPLICATOR (WOUND CARE) ×3 IMPLANT
ELECT REM PT RETURN 15FT ADLT (MISCELLANEOUS) ×2 IMPLANT
ELECT REM PT RETURN 9FT ADLT (ELECTROSURGICAL) ×3
ELECTRODE REM PT RTRN 9FT ADLT (ELECTROSURGICAL) ×1 IMPLANT
GLOVE BIOGEL M 7.0 STRL (GLOVE) ×2 IMPLANT
GLOVE BIOGEL PI IND STRL 7.5 (GLOVE) ×1 IMPLANT
GLOVE BIOGEL PI IND STRL 8.5 (GLOVE) ×1 IMPLANT
GLOVE BIOGEL PI INDICATOR 7.5 (GLOVE) ×6
GLOVE BIOGEL PI INDICATOR 8.5 (GLOVE) ×2
GLOVE ECLIPSE 8.0 STRL XLNG CF (GLOVE) ×6 IMPLANT
GLOVE ORTHO TXT STRL SZ7.5 (GLOVE) ×3 IMPLANT
GOWN STRL REUS W/TWL LRG LVL3 (GOWN DISPOSABLE) ×3 IMPLANT
GOWN STRL REUS W/TWL XL LVL3 (GOWN DISPOSABLE) ×3 IMPLANT
HOLDER FOLEY CATH W/STRAP (MISCELLANEOUS) ×3 IMPLANT
LIQUID BAND (GAUZE/BANDAGES/DRESSINGS) ×3 IMPLANT
PACK ANTERIOR HIP CUSTOM (KITS) ×3 IMPLANT
SAW OSC TIP CART 19.5X105X1.3 (SAW) ×3 IMPLANT
SUT MNCRL AB 4-0 PS2 18 (SUTURE) ×3 IMPLANT
SUT STRATAFIX 0 PDS 27 VIOLET (SUTURE) ×3
SUT VIC AB 1 CT1 36 (SUTURE) ×9 IMPLANT
SUT VIC AB 2-0 CT1 27 (SUTURE) ×6
SUT VIC AB 2-0 CT1 TAPERPNT 27 (SUTURE) ×2 IMPLANT
SUT VLOC 180 0 24IN GS25 (SUTURE) ×1 IMPLANT
SUTURE STRATFX 0 PDS 27 VIOLET (SUTURE) IMPLANT
TRAY FOLEY W/METER SILVER 14FR (SET/KITS/TRAYS/PACK) ×2 IMPLANT
TRAY FOLEY W/METER SILVER 16FR (SET/KITS/TRAYS/PACK) IMPLANT
WATER STERILE IRR 1500ML POUR (IV SOLUTION) ×3 IMPLANT
YANKAUER SUCT BULB TIP 10FT TU (MISCELLANEOUS) ×2 IMPLANT

## 2015-10-28 NOTE — Interval H&P Note (Signed)
History and Physical Interval Note:  10/28/2015 7:17 AM  [name] [name]  has presented today for surgery, with the diagnosis of RIGHT HIP OA  The various methods of treatment have been discussed with the patient and family. After consideration of risks, benefits and other options for treatment, the patient has consented to  Procedure(s): RIGHT TOTAL HIP ARTHROPLASTY ANTERIOR APPROACH (Right) as a surgical intervention .  The patient's history has been reviewed, patient examined, no change in status, stable for surgery.  I have reviewed the patient's chart and labs.  Questions were answered to the patient's satisfaction.     Mauri Pole

## 2015-10-28 NOTE — Progress Notes (Signed)
Utilization review completed.  

## 2015-10-28 NOTE — Progress Notes (Signed)
X-ray results noted 

## 2015-10-28 NOTE — Clinical Social Work Placement (Signed)
   CLINICAL SOCIAL WORK PLACEMENT  NOTE  Date:  10/28/2015  Patient Details  Name: TIMEA KOZISEK MRN: [id number] Date of Birth: 04/30/1941  Clinical Social Work is seeking post-discharge placement for this patient at the [place] level of care (*CSW will initial, date and re-position this form in  chart as items are completed):  Yes   Patient/family provided with [place] Work Department's list of facilities offering this level of care within the geographic area requested by the patient (or if unable, by the patient's family).  Yes   Patient/family informed of their freedom to choose among providers that offer the needed level of care, that participate in Medicare, Medicaid or managed care program needed by the patient, have an available bed and are willing to accept the patient.  Yes   Patient/family informed of [place]'s ownership interest in [facility] and [facility], as well as of the fact that they are under no obligation to receive care at these facilities.  PASRR submitted to EDS on       PASRR number received on       Existing PASRR number confirmed on 10/28/15     FL2 transmitted to all facilities in geographic area requested by pt/family on 10/28/15     FL2 transmitted to all facilities within larger geographic area on       Patient informed that his/her managed care company has contracts with or will negotiate with certain facilities, including the following:        No   Patient/family informed of bed offers received.  Patient chooses bed at       Physician recommends and patient chooses bed at      Patient to be transferred to   on  .  Patient to be transferred to facility by       Patient family notified on   of transfer.  Name of family member notified:        PHYSICIAN       Additional Comment:    _______________________________________________ Luretha Rued, [place]  [phone] 10/28/2015, 2:19  PM

## 2015-10-28 NOTE — Transfer of Care (Signed)
Immediate Anesthesia Transfer of Care Note  Patient: [name] [name]  Procedure(s) Performed: Procedure(s): RIGHT TOTAL HIP ARTHROPLASTY ANTERIOR APPROACH (Right)  Patient Location: PACU  Anesthesia Type:General  Level of Consciousness: awake and alert   Airway & Oxygen Therapy: Patient Spontanous Breathing and Patient connected to face mask oxygen  Post-op Assessment: Report given to RN and Post -op Vital signs reviewed and stable  Post vital signs: Reviewed and stable  Last Vitals:  Filed Vitals:   10/28/15 0518  BP: 181/83  Pulse: 73  Temp: 36.6 C  Resp: 18    Complications: No apparent anesthesia complications

## 2015-10-28 NOTE — Anesthesia Postprocedure Evaluation (Signed)
Anesthesia Post Note  Patient: [name] [name]  Procedure(s) Performed: Procedure(s) (LRB): RIGHT TOTAL HIP ARTHROPLASTY ANTERIOR APPROACH (Right)  Patient location during evaluation: PACU Anesthesia Type: General Level of consciousness: awake and alert Pain management: pain level controlled Vital Signs Assessment: post-procedure vital signs reviewed and stable Respiratory status: spontaneous breathing, nonlabored ventilation, respiratory function stable and patient connected to nasal cannula oxygen Cardiovascular status: blood pressure returned to baseline and stable Postop Assessment: no signs of nausea or vomiting Anesthetic complications: no    Last Vitals:  Filed Vitals:   10/28/15 1104 10/28/15 1201  BP: 157/66 168/74  Pulse: 82 87  Temp: 36.5 C 36.4 C  Resp: 16 16    Last Pain:  Filed Vitals:   10/28/15 1202  PainSc: 2                  Effie Berkshire

## 2015-10-28 NOTE — Progress Notes (Signed)
Portable AP Pelvis and Lateral Right Hip X-rays done. 

## 2015-10-28 NOTE — Evaluation (Signed)
Physical Therapy Evaluation Patient Details Name: [name] [name] MRN: [id number] DOB: September 21, 1941 Today's Date: 10/28/2015   History of Present Illness  Pt is a 74 year old female s/p R THA with direct anterior approach with hx of CVA, low back pain, breast cancer, and left hip arthroplasty for an impending pathologic fracture on 04/01/2011 per Dr. Alvan Dame  Clinical Impression  Pt is s/p R THA resulting in the deficits listed below (see PT Problem List).  Pt will benefit from skilled PT to increase their independence and safety with mobility to allow discharge to the venue listed below.  Pt able to tolerate short distance ambulation POD #0 and plans to d/c to SNF.     Follow Up Recommendations SNF    Equipment Recommendations  None recommended by PT    Recommendations for Other Services       Precautions / Restrictions Precautions Precautions: Fall Precaution Comments: direct anterior approach Restrictions Other Position/Activity Restrictions: WBAT      Mobility  Bed Mobility Overal bed mobility: Needs Assistance Bed Mobility: Supine to Sit     Supine to sit: Min assist;HOB elevated     General bed mobility comments: verbal cues for technique, assist required for R LE  Transfers Overall transfer level: Needs assistance Equipment used: Rolling walker (2 wheeled) Transfers: Sit to/from Stand Sit to Stand: Min assist         General transfer comment: verbal cues for safe technique, assist to rise, steady and control descent  Ambulation/Gait Ambulation/Gait assistance: Min assist Ambulation Distance (Feet): 35 Feet Assistive device: Rolling walker (2 wheeled) Gait Pattern/deviations: Step-to pattern;Decreased stance time - right;Antalgic     General Gait Details: verbal cues for sequence, step length, RW positioning, posture  Stairs            Wheelchair Mobility    Modified Rankin (Stroke Patients Only)       Balance                                              Pertinent Vitals/Pain Pain Assessment: 0-10 Pain Score: 2  Pain Location: R hip Pain Descriptors / Indicators: Sore Pain Intervention(s): Limited activity within patient's tolerance;Monitored during session;Repositioned;Ice applied    Home Living Family/patient expects to be discharged to:: Skilled nursing facility Living Arrangements: Alone               Additional Comments: has RW, SPC    Prior Function Level of Independence: Independent with assistive device(s)               Hand Dominance        Extremity/Trunk Assessment               Lower Extremity Assessment: RLE deficits/detail RLE Deficits / Details: functional weakness observed, assist required for bed mobility       Communication   Communication: No difficulties  Cognition Arousal/Alertness: Awake/alert Behavior During Therapy: WFL for tasks assessed/performed Overall Cognitive Status: Within Functional Limits for tasks assessed                      General Comments      Exercises        Assessment/Plan    PT Assessment Patient needs continued PT services  PT Diagnosis Difficulty walking;Acute pain   PT Problem List Decreased strength;Decreased mobility;Decreased knowledge of use  of DME;Pain  PT Treatment Interventions Functional mobility training;Gait training;DME instruction;Patient/family education;Therapeutic activities;Therapeutic exercise   PT Goals (Current goals can be found in the Care Plan section) Acute Rehab PT Goals PT Goal Formulation: With patient Time For Goal Achievement: 11/01/15 Potential to Achieve Goals: Good    Frequency 7X/week   Barriers to discharge        Co-evaluation               End of Session Equipment Utilized During Treatment: Gait belt Activity Tolerance: Patient tolerated treatment well Patient left: in chair;with call bell/phone within reach;with chair alarm set           Time:  1430-1445 PT Time Calculation (min) (ACUTE ONLY): 15 min   Charges:   PT Evaluation $Initial PT Evaluation Tier I: 1 Procedure     PT G Codes:        Gordie Belvin,KATHrine E 10/28/2015, 3:21 PM Carmelia Bake, PT, DPT 10/28/2015 Pager: [phone]

## 2015-10-28 NOTE — Progress Notes (Signed)
Patient admitted to room 1602. Oriented to room, call light and unit procedure. Plan of care for today over viewed with patient verbalized understanding. Will continue to monitor patient. Urban Gibson Lexine Baton

## 2015-10-28 NOTE — Clinical Social Work Note (Signed)
Clinical Social Work Assessment  Patient Details  Name: [name] [name] MRN: [id number] Date of Birth: 07/15/1941  Date of referral:  10/28/15               Reason for consult:  Facility Placement, Discharge Planning                Permission sought to share information with:  Chartered certified accountant granted to share information::  Yes, Verbal Permission Granted  Name::        Agency::     Relationship::     Contact Information:     Housing/Transportation Living arrangements for the past 2 months:  Single Family Home Source of Information:  Patient Patient Interpreter Needed:  None Criminal Activity/Legal Involvement Pertinent to Current Situation/Hospitalization:  No - Comment as needed Significant Relationships:  Friend Lives with:  Self Do you feel safe going back to the place where you live?  No (Pt feels she needs ST Rehab.) Need for family participation in patient care:  No (Coment)  Care giving concerns:  Pt's care cannot be managed at home following hospital d/c.   Social Worker assessment / plan: Pt hospitalized on 10/28/15 for pre planned right total hip arthroplasty.  CSW met with pt to assist with d/c planning. PT eval / recommendations are pending. Pt lives alone and feels she would benefit from a ST Rehab placement at d/c. CSW has initiated SNF search and bed offers are pending. CSW, at pt's request, has contacted [facility]. SNF is reviewing clinicals and will contact CSW with a decision. Pt has [employer] which requires prior authorization. CSW will assist with the authorization process once PT recommendations are available.  Employment status:  Retired Nurse, adult PT Recommendations:  Not assessed at this time Information / Referral to community resources:  [place]  Patient/Family's Response to care: Pt feels ST Rehab is needed.  Patient/Family's Understanding of and Emotional Response to  Diagnosis, Current Treatment, and Prognosis: Pt is aware of her medical status. She is motivated to work with therapy and is hopeful that [facility] will have a rehab opening for her at d/c.  Emotional Assessment Appearance:  Appears stated age Attitude/Demeanor/Rapport:  Other (cooperative) Affect (typically observed):  Calm, Pleasant Orientation:  Oriented to Self, Oriented to Place, Oriented to  Time, Oriented to Situation Alcohol / Substance use:  Not Applicable Psych involvement (Current and /or in the community):  No (Comment)  Discharge Needs  Concerns to be addressed:  Discharge Planning Concerns Readmission within the last 30 days:  No Current discharge risk:  None Barriers to Discharge:  No Barriers Identified   Luretha Rued, [place] 10/28/2015, 2:13 PM

## 2015-10-28 NOTE — Progress Notes (Signed)
Lungs sound clear with diminished breath sounds

## 2015-10-28 NOTE — NC FL2 (Signed)
[place] LEVEL OF CARE SCREENING TOOL     IDENTIFICATION  Patient Name: [name] [name] Birthdate: 1941-04-27 Sex: female Admission Date (Current Location): 10/28/2015  [facility] and [place] Number:  Herbalist and Address:  [facility],  [place] [address] [place]      Provider Number: [id number]  Attending Physician Name and Address:  Paralee Cancel, MD  Relative Name and Phone Number:       Current Level of Care: Hospital Recommended Level of Care: [place] Prior Approval Number:    Date Approved/Denied:   PASRR Number: [id number]  Discharge Plan: SNF    Current Diagnoses: Patient Active Problem List   Diagnosis Date Noted  . S/P right THA, AA 10/28/2015  . Full code status 08/06/2015  . Knee pain, right 07/28/2015  . Osteoarthritis of right lower extremity 09/17/2014  . Cyst of lateral meniscus 08/27/2014  . Cyst of lateral meniscus of right knee 08/27/2014  . Benign paroxysmal positional vertigo 07/12/2014  . Hypokalemia 07/06/2013  . Knee pain, right anterior 04/04/2013  . Creatinine elevation 04/04/2013  . Dyspnea 05/12/2012  . GERD (gastroesophageal reflux disease) 05/12/2012  . Secondary diabetes mellitus with renal manifestations 02/11/2012  . Breast cancer metastasized to bone ([place]) 04/23/2011  . Edema 04/23/2011  . ANXIETY 11/03/2010  . OSTEOARTHRITIS 10/21/2010  . Lumbago 10/21/2010  . HIP PAIN 10/07/2010  . NAUSEA AND VOMITING 08/26/2010  . HYPERLIPIDEMIA 02/18/2010  . CEREBROVASCULAR ACCIDENT, HX OF 02/18/2010  . WEIGHT GAIN, ABNORMAL 12/19/2009  . LEG PAIN, RIGHT 04/16/2009  . VITAMIN D DEFICIENCY 03/07/2009  . Hidradenitis 04/12/2008  . FATIGUE 10/03/2007  . Rash and other nonspecific skin eruption 10/03/2007  . 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 Essential hypertension 10/02/2007  . Asthma 10/02/2007    Orientation RESPIRATION BLADDER Height & Weight    Self, Time, Situation,  Place  Normal Continent 5\' 3"  (160 cm) 205 lbs.  BEHAVIORAL SYMPTOMS/MOOD NEUROLOGICAL BOWEL NUTRITION STATUS  Other (Comment) (No Behaviors)   Continent Diet (carb mod)  AMBULATORY STATUS COMMUNICATION OF NEEDS Skin   Extensive Assist Verbally Surgical wounds                       Personal Care Assistance Level of Assistance  Bathing, Feeding, Dressing Bathing Assistance: Limited assistance Feeding assistance: Independent Dressing Assistance: Limited assistance     Functional Limitations Info             SPECIAL CARE FACTORS FREQUENCY  PT (By licensed PT), OT (By licensed OT)     PT Frequency: 5 x wk OT Frequency: 5 x wk            Contractures Contractures Info: Not present    Additional Factors Info  Code Status Code Status Info: Full Code             Current Medications (10/28/2015):  This is the current hospital active medication list Current Facility-Administered Medications  Medication Dose Route Frequency Provider Last Rate Last Dose  . albuterol (PROVENTIL) (2.5 MG/3ML) 0.083% nebulizer solution           . alum & mag hydroxide-simeth (MAALOX/MYLANTA) 200-200-20 MG/5ML suspension 30 mL  30 mL Oral Q4H PRN Danae Orleans, PA-C      . amLODipine (NORVASC) tablet 10 mg  10 mg Oral Daily Danae Orleans, PA-C   10 mg at 10/28/15 1333  . [START ON 10/29/2015] anastrozole (ARIMIDEX) tablet 1 mg  1 mg Oral  Daily Danae Orleans, PA-C      . Derrill Memo ON 10/29/2015] aspirin EC tablet 325 mg  325 mg Oral BID Danae Orleans, PA-C      . bisacodyl (DULCOLAX) suppository 10 mg  10 mg Rectal Daily PRN Danae Orleans, PA-C      . ceFAZolin (ANCEF) IVPB 2 g/50 mL premix  2 g Intravenous Q6H Danae Orleans, PA-C   2 g at 10/28/15 1333  . celecoxib (CELEBREX) capsule 200 mg  200 mg Oral Q12H Danae Orleans, PA-C      . Derrill Memo ON 10/29/2015] dexamethasone (DECADRON) injection 10 mg  10 mg Intravenous Once Danae Orleans, PA-C      . diphenhydrAMINE (BENADRYL) capsule 25  mg  25 mg Oral Q6H PRN Danae Orleans, PA-C      . docusate sodium (COLACE) capsule 100 mg  100 mg Oral BID Danae Orleans, PA-C      . ferrous sulfate tablet 325 mg  325 mg Oral TID PC Danae Orleans, PA-C      . losartan (COZAAR) tablet 100 mg  100 mg Oral Daily Paralee Cancel, MD   100 mg at 10/28/15 1333   And  . hydrochlorothiazide (HYDRODIURIL) tablet 25 mg  25 mg Oral Daily Paralee Cancel, MD   25 mg at 10/28/15 1333  . HYDROcodone-acetaminophen (NORCO) 7.5-325 MG per tablet 1-2 tablet  1-2 tablet Oral Q4H Danae Orleans, PA-C      . HYDROmorphone (DILAUDID) injection 0.5-1 mg  0.5-1 mg Intravenous Q2H PRN Danae Orleans, PA-C   1 mg at 10/28/15 1224  . magnesium citrate solution 1 Bottle  1 Bottle Oral Once PRN Danae Orleans, PA-C      . meclizine (ANTIVERT) tablet 12.5 mg  12.5 mg Oral BID PRN Danae Orleans, PA-C      . menthol-cetylpyridinium (CEPACOL) lozenge 3 mg  1 lozenge Oral PRN Danae Orleans, PA-C       Or  . phenol (CHLORASEPTIC) mouth spray 1 spray  1 spray Mouth/Throat PRN Danae Orleans, PA-C      . methocarbamol (ROBAXIN) tablet 500 mg  500 mg Oral Q6H PRN Danae Orleans, PA-C       Or  . methocarbamol (ROBAXIN) 500 mg in dextrose 5 % 50 mL IVPB  500 mg Intravenous Q6H PRN Danae Orleans, PA-C   500 mg at 10/28/15 1030  . metoCLOPramide (REGLAN) tablet 5-10 mg  5-10 mg Oral Q8H PRN Danae Orleans, PA-C       Or  . metoCLOPramide (REGLAN) injection 5-10 mg  5-10 mg Intravenous Q8H PRN Danae Orleans, PA-C      . ondansetron [facility]) tablet 4 mg  4 mg Oral Q6H PRN Danae Orleans, PA-C       Or  . ondansetron [facility]) injection 4 mg  4 mg Intravenous Q6H PRN Danae Orleans, PA-C      . polyethylene glycol (MIRALAX / GLYCOLAX) packet 17 g  17 g Oral BID Danae Orleans, PA-C      . sodium chloride 0.9 % 1,000 mL with potassium chloride 10 mEq infusion  100 mL/hr Intravenous Continuous Danae Orleans, PA-C         Discharge Medications: Please see discharge summary for a  list of discharge medications.  Relevant Imaging Results:  Relevant Lab Results:   Additional Information SS # [id number]  Tibor Lemmons, Randall An, LCSW

## 2015-10-28 NOTE — Op Note (Signed)
NAME:  [name] [name]                ACCOUNT NO.: [id number]      MEDICAL RECORD NO.: [id number]      FACILITY:  [facility]      PHYSICIAN:  Paralee Cancel D  DATE OF BIRTH:  03-17-1941     DATE OF PROCEDURE:  10/28/2015                                 OPERATIVE REPORT         PREOPERATIVE DIAGNOSIS: Right  hip osteoarthritis.      POSTOPERATIVE DIAGNOSIS:  Right hip osteoarthritis.  S/p left total hip replacement     PROCEDURE:  Right total hip replacement through an anterior approach   utilizing DePuy THR system, component size 70mm pinnacle cup, a size 32+4 neutral   Altrex liner, a size 4 Hi Tri Lock stem with a 32+1 delta ceramic   ball.      SURGEON:  Pietro Cassis. Alvan Dame, M.D.      ASSISTANT:  Danae Orleans, PA-C     ANESTHESIA:  General.      SPECIMENS:  None.      COMPLICATIONS:  None.      BLOOD LOSS:  200 cc     DRAINS:  None.      INDICATION OF THE PROCEDURE:  [name] [name] is a 74 y.o. female who had   presented to office for evaluation of right hip pain.  Radiographs revealed   progressive degenerative changes with bone-on-bone   articulation to the  hip joint.  The patient had painful limited range of   motion significantly affecting their overall quality of life.  The patient was failing to    respond to conservative measures, and at this point was ready   to proceed with more definitive measures.  The patient has noted progressive   degenerative changes in his hip, progressive problems and dysfunction   with regarding the hip prior to surgery.  Consent was obtained for   benefit of pain relief.  Specific risk of infection, DVT, component   failure, dislocation, need for revision surgery, as well discussion of   the anterior versus posterior approach were reviewed.  Consent was   obtained for benefit of anterior pain relief through an anterior   approach.      PROCEDURE IN DETAIL:  The patient was brought to operative theater.   Once adequate anesthesia, preoperative antibiotics, 2gm of Ancef, 1 gm of Tranexamic, and 10 mg of Decadron administered.   The patient was positioned supine on the OSI Hanna table.  Once adequate   padding of boney process was carried out, we had predraped out the hip, and  used fluoroscopy to confirm orientation of the pelvis and position.      The right hip was then prepped and draped from proximal iliac crest to   mid thigh with shower curtain technique.      Time-out was performed identifying the patient, planned procedure, and   extremity.     An incision was then made 2 cm distal and lateral to the   anterior superior iliac spine extending over the orientation of the   tensor fascia lata muscle and sharp dissection was carried down to the   fascia of the muscle and protractor placed in the soft tissues.  The fascia was then incised.  The muscle belly was identified and swept   laterally and retractor placed along the superior neck.  Following   cauterization of the circumflex vessels and removing some pericapsular   fat, a second cobra retractor was placed on the inferior neck.  A third   retractor was placed on the anterior acetabulum after elevating the   anterior rectus.  A L-capsulotomy was along the line of the   superior neck to the trochanteric fossa, then extended proximally and   distally.  Tag sutures were placed and the retractors were then placed   intracapsular.  We then identified the trochanteric fossa and   orientation of my neck cut, confirmed this radiographically   and then made a neck osteotomy with the femur on traction.  The femoral   head was removed without difficulty or complication.  Traction was let   off and retractors were placed posterior and anterior around the   acetabulum.      The labrum and foveal tissue were debrided.  I began reaming with a 7mm   reamer and reamed up to 70mm reamer with good bony bed preparation and a 82mm   cup was  chosen.  The final 46mm Pinnacle cup was then impacted under fluoroscopy  to confirm the depth of penetration and orientation with respect to   abduction.  A screw was placed followed by the hole eliminator.  The final   32+4 neutral Altrex liner was impacted with good visualized rim fit.  The cup was positioned anatomically within the acetabular portion of the pelvis.      At this point, the femur was rolled at 80 degrees.  Further capsule was   released off the inferior aspect of the femoral neck.  I then   released the superior capsule proximally.  The hook was placed laterally   along the femur and elevated manually and held in position with the bed   hook.  The leg was then extended and adducted with the leg rolled to 100   degrees of external rotation.  Once the proximal femur was fully   exposed, I used a box osteotome to set orientation.  I then began   broaching with the starting chili pepper broach and passed this by hand and then broached up to 4 to match the other hip.  With the 4 broach in place I chose a high offset neck and did a trial reduction.  The offset was appropriate, leg lengths   appeared to be equal, confirmed radiographically.   Given these findings, I went ahead and dislocated the hip, repositioned all   retractors and positioned the right hip in the extended and abducted position.  The final 4 hi Tri Lock stem was   chosen and it was impacted down to the level of neck cut.  Based on this   and the trial reduction, a 32+1 delta ceramic ball was chosen and   impacted onto a clean and dry trunnion, and the hip was reduced.  The   hip had been irrigated throughout the case again at this point.  I did   reapproximate the superior capsular leaflet to the anterior leaflet   using #1 Vicryl  The fascia of the   tensor fascia lata muscle was then reapproximated using #1 Vicryl and #0 Quill suture.  The   remaining wound was closed with 2-0 Vicryl and running 4-0 Monocryl.    The hip was cleaned, dried,  and dressed sterilely using Dermabond and   Aquacel dressing.  She was then brought   to recovery room in stable condition tolerating the procedure well.    Danae Orleans, PA-C was present for the entirety of the case involved from   preoperative positioning, perioperative retractor management, general   facilitation of the case, as well as primary wound closure as assistant.            Pietro Cassis Alvan Dame, M.D.        10/28/2015 9:03 AM

## 2015-10-28 NOTE — Progress Notes (Signed)
Pre-op assessment, Pt states she has not had but 1 dose of norvasc and losartan/hctz in past week. Also, power port has not been flushed in 3 months.

## 2015-10-28 NOTE — Anesthesia Procedure Notes (Signed)
Procedure Name: Intubation Date/Time: 10/28/2015 7:32 AM Performed by: [place] Koleton Duchemin C Pre-anesthesia Checklist: Patient identified, Timeout performed, Emergency Drugs available, Suction available and Patient being monitored Patient Re-evaluated:Patient Re-evaluated prior to inductionOxygen Delivery Method: Circle system utilized Preoxygenation: Pre-oxygenation with 100% oxygen Intubation Type: IV induction Ventilation: Mask ventilation without difficulty Laryngoscope Size: Mac and 3 Grade View: Grade I Tube type: Oral Tube size: 7.0 mm Number of attempts: 1 Airway Equipment and Method: Stylet Placement Confirmation: ETT inserted through vocal cords under direct vision,  breath sounds checked- equal and bilateral,  positive ETCO2 and CO2 detector Secured at: 21 cm Tube secured with: Tape Dental Injury: Teeth and Oropharynx as per pre-operative assessment

## 2015-10-28 NOTE — Progress Notes (Signed)
Albuterol Nebulizer  Treatment given- patient complained of some difficulty breathing- SA02 100- Color satisfactory- slight expiratory wheezing auscultated.

## 2015-10-29 DIAGNOSIS — E669 Obesity, unspecified: Secondary | ICD-10-CM | POA: Diagnosis present

## 2015-10-29 LAB — CBC
HCT: 31.9 % — ABNORMAL LOW (ref 36.0–46.0)
HEMOGLOBIN: 10.4 g/dL — AB (ref 12.0–15.0)
MCH: 28.7 pg (ref 26.0–34.0)
MCHC: 32.6 g/dL (ref 30.0–36.0)
MCV: 87.9 fL (ref 78.0–100.0)
PLATELETS: 223 10*3/uL (ref 150–400)
RBC: 3.63 MIL/uL — AB (ref 3.87–5.11)
RDW: 15.3 % (ref 11.5–15.5)
WBC: 8.7 10*3/uL (ref 4.0–10.5)

## 2015-10-29 LAB — BASIC METABOLIC PANEL
Anion gap: 9 (ref 5–15)
BUN: 16 mg/dL (ref 6–20)
CHLORIDE: 100 mmol/L — AB (ref 101–111)
CO2: 23 mmol/L (ref 22–32)
CREATININE: 1.09 mg/dL — AB (ref 0.44–1.00)
Calcium: 8.9 mg/dL (ref 8.9–10.3)
GFR calc Af Amer: 57 mL/min — ABNORMAL LOW (ref >60)
GFR calc non Af Amer: 49 mL/min — ABNORMAL LOW (ref >60)
GLUCOSE: 136 mg/dL — AB (ref 65–99)
POTASSIUM: 4.1 mmol/L (ref 3.5–5.1)
Sodium: 132 mmol/L — ABNORMAL LOW (ref 135–145)

## 2015-10-29 NOTE — Care Management Note (Signed)
Case Management Note  Patient Details  Name: [name] [name] MRN: [id number] Date of Birth: [birth year]  Subjective/Objective: 74 y/o  f admitted w/R THA. From home. PT/OT-recc SNF.  CSW following.                   Action/Plan:d/c plan SNF in am if stable.   Expected Discharge Date:                 Expected Discharge Plan:  Skilled Nursing Facility  In-House Referral:  Clinical Social Work  Discharge planning Services  CM Consult  Post Acute Care Choice:    Choice offered to:     DME Arranged:    DME Agency:     HH Arranged:    [place] Agency:     Status of Service:  In process, will continue to follow  Medicare Important Message Given:    Date Medicare IM Given:    Medicare IM give by:    Date Additional Medicare IM Given:    Additional Medicare Important Message give by:     If discussed at [place] of Stay Meetings, dates discussed:    Additional Comments:  Dessa Phi, RN 10/29/2015, 10:41 AM

## 2015-10-29 NOTE — Progress Notes (Signed)
     Subjective: 1 Day Post-Op Procedure(s) (LRB): RIGHT TOTAL HIP ARTHROPLASTY ANTERIOR APPROACH (Right)   Patient reports pain as mild, pain controlled. No events throughout the night.   Objective:   VITALS:   Filed Vitals:   10/29/15 0050 10/29/15 0430  BP: 132/48 120/77  Pulse: 81 86  Temp: 98.5 F (36.9 C) 98.2 F (36.8 C)  Resp: 16 20    Dorsiflexion/Plantar flexion intact Incision: dressing C/D/I No cellulitis present Compartment soft  LABS  Recent Labs  10/29/15 0420  HGB 10.4*  HCT 31.9*  WBC 8.7  PLT 223     Recent Labs  10/29/15 0420  NA 132*  K 4.1  BUN 16  CREATININE 1.09*  GLUCOSE 136*     Assessment/Plan: 1 Day Post-Op Procedure(s) (LRB): RIGHT TOTAL HIP ARTHROPLASTY ANTERIOR APPROACH (Right) Foley cath d/c'ed Advance diet Up with therapy D/C IV fluids Discharge to SNF eventually, when ready  Obese (BMI 30-39.9) Estimated body mass index is 36.36 kg/(m^2) as calculated from the following:   Height as of this encounter: 5\' 3"  (1.6 m).   Weight as of this encounter: 93.072 kg (205 lb 3 oz). Patient also counseled that weight may inhibit the healing process Patient counseled that losing weight will help with future health issues     [name] [name]. [name] [name]   PAC  10/29/2015, 7:54 AM

## 2015-10-29 NOTE — Evaluation (Signed)
Occupational Therapy Evaluation Patient Details Name: [name] [name] MRN: [id number] DOB: [birth year] Today's Date: 10/29/2015    History of Present Illness Pt is a 74 year old female s/p R THA with direct anterior approach with hx of CVA, low back pain, breast cancer, and left hip arthroplasty for an impending pathologic fracture on 04/01/2011 per Dr. Alvan Dame   Clinical Impression   Pt was admitted for the above. She was mod I to independent with adls prior to admission. She will benefit from skilled OT in acute and SNF setting to restore independence. Goals in acute are for supervision level; she currently needs min A for SPT and up to max A for LB adls    Follow Up Recommendations  SNF    Equipment Recommendations  3 in 1 bedside comode    Recommendations for Other Services       Precautions / Restrictions Precautions Precautions: Fall Precaution Comments: direct anterior approach Restrictions Weight Bearing Restrictions: No      Mobility Bed Mobility         Supine to sit: Min assist;HOB elevated     General bed mobility comments: assist for RLE  Transfers   Equipment used: Rolling walker (2 wheeled) Transfers: Sit to/from [employer] to Stand: Min assist Stand pivot transfers: Min assist       General transfer comment: steadying assistance; cues for UE/LE placement    Balance                                            ADL Overall ADL's : Needs assistance/impaired     Grooming: Set up;Sitting   Upper Body Bathing: Set up;Sitting   Lower Body Bathing: Moderate assistance;Sit to/from stand   Upper Body Dressing : Set up;Sitting   Lower Body Dressing: Maximal assistance;Sit to/from stand   Toilet Transfer: Minimal assistance;Stand-pivot;RW (to recliner)   Toileting- Clothing Manipulation and Hygiene: Minimal assistance;Sit to/from stand         General ADL Comments: performed ADL  Educated on AE--but  did not use this session.  Pt has used it in the past     Vision     Perception     Praxis      Pertinent Vitals/Pain Pain Score: 4  Pain Location: R hip Pain Descriptors / Indicators: Burning (with movement) Pain Intervention(s): Limited activity within patient's tolerance;Monitored during session;Premedicated before session;Repositioned (removed ice)     Hand Dominance     Extremity/Trunk Assessment Upper Extremity Assessment Upper Extremity Assessment: Overall WFL for tasks assessed (RUE still a little sore "overdid it" prior to sx)           Communication Communication Communication: No difficulties   Cognition Arousal/Alertness: Awake/alert Behavior During Therapy: WFL for tasks assessed/performed Overall Cognitive Status: Within Functional Limits for tasks assessed                     General Comments       Exercises       Shoulder Instructions      Home Living Family/patient expects to be discharged to:: Skilled nursing facility Living Arrangements: Alone                               Additional Comments: has RW, [facility]      Prior  Functioning/Environment Level of Independence: Independent with assistive device(s)             OT Diagnosis: Acute pain   OT Problem List: Decreased strength;Decreased knowledge of use of DME or AE;Pain   OT Treatment/Interventions: Self-care/ADL training;DME and/or AE instruction;Patient/family education    OT Goals(Current goals can be found in the care plan section) Acute Rehab OT Goals Patient Stated Goal: get back to being independent OT Goal Formulation: With patient Time For Goal Achievement: 11/05/15 Potential to Achieve Goals: Good ADL Goals Pt Will Perform Grooming: with supervision;standing Pt Will Perform Lower Body Bathing: with supervision;with adaptive equipment;sit to/from stand Pt Will Perform Lower Body Dressing: with supervision;with adaptive equipment;sit to/from stand Pt  Will Transfer to Toilet: with supervision;ambulating;bedside commode Pt Will Perform Toileting - Clothing Manipulation and hygiene: with supervision  OT Frequency: Min 2X/week   Barriers to D/C:            Co-evaluation              End of Session    Activity Tolerance: Patient tolerated treatment well Patient left: in chair;with call bell/phone within reach;with chair alarm set   Time: [id number] OT Time Calculation (min): 28 min Charges:  OT General Charges $OT Visit: 1 Procedure OT Evaluation $Initial OT Evaluation Tier I: 1 Procedure OT Treatments $Self Care/Home Management : 8-22 mins G-Codes:    Kaleena Corrow 2015/11/28, 8:41 AM Lesle Chris, OTR/L [phone] 2015/11/28

## 2015-10-29 NOTE — Progress Notes (Signed)
Physical Therapy Treatment Patient Details Name: [name] [name] MRN: [id number] DOB: [birth year] Today's Date: Nov 19, 2015    History of Present Illness Pt is a 74 year old female s/p R THA with direct anterior approach with hx of CVA, low back pain, breast cancer, and left hip arthroplasty for an impending pathologic fracture on 04/01/2011 per Dr. Alvan Dame    PT Comments    Pt assisted with ambulating in hallway and then performed LE exercises.    Follow Up Recommendations  SNF     Equipment Recommendations  None recommended by PT    Recommendations for Other Services       Precautions / Restrictions Precautions Precautions: Fall Precaution Comments: direct anterior approach Restrictions Other Position/Activity Restrictions: WBAT    Mobility  Bed Mobility               General bed mobility comments: pt up in recliner on arrival  Transfers Overall transfer level: Needs assistance Equipment used: Rolling walker (2 wheeled) Transfers: Sit to/from Stand Sit to Stand: Min guard         General transfer comment: verbal cues for UE and LE positioning  Ambulation/Gait Ambulation/Gait assistance: Min guard Ambulation Distance (Feet): 90 Feet Assistive device: Rolling walker (2 wheeled) Gait Pattern/deviations: Step-to pattern;Decreased stance time - right;Antalgic Gait velocity: decreased   General Gait Details: verbal cues for sequence, step length, RW positioning, posture   Stairs            Wheelchair Mobility    Modified Rankin (Stroke Patients Only)       Balance                                    Cognition Arousal/Alertness: Awake/alert Behavior During Therapy: WFL for tasks assessed/performed Overall Cognitive Status: Within Functional Limits for tasks assessed                      Exercises Total Joint Exercises Ankle Circles/Pumps: AROM;Both;10 reps Quad Sets: AROM;Both;10 reps Towel Squeeze: AROM;Both;10  reps Short Arc Quad: AROM;Right;10 reps Heel Slides: AAROM;Right;10 reps Hip ABduction/ADduction: AROM;Right;10 reps    General Comments        Pertinent Vitals/Pain Pain Assessment: 0-10 Pain Score: 4  Pain Location: R hip Pain Descriptors / Indicators: Burning Pain Intervention(s): Limited activity within patient's tolerance;Monitored during session;Repositioned;Premedicated before session;Ice applied    Home Living                      Prior Function            PT Goals (current goals can now be found in the care plan section) Progress towards PT goals: Progressing toward goals    Frequency  7X/week    PT Plan Current plan remains appropriate    Co-evaluation             End of Session Equipment Utilized During Treatment: Gait belt Activity Tolerance: Patient tolerated treatment well Patient left: in chair;with call bell/phone within reach;with chair alarm set     Time: [id number] PT Time Calculation (min) (ACUTE ONLY): 20 min  Charges:  $Gait Training: 8-22 mins                    G Codes:      Tambria Pfannenstiel,KATHrine E 2015/11/19, 1:48 PM Carmelia Bake, PT, DPT Nov 19, 2015 Pager: [phone]

## 2015-10-30 LAB — BASIC METABOLIC PANEL
Anion gap: 7 (ref 5–15)
BUN: 25 mg/dL — AB (ref 6–20)
CHLORIDE: 100 mmol/L — AB (ref 101–111)
CO2: 26 mmol/L (ref 22–32)
Calcium: 9.2 mg/dL (ref 8.9–10.3)
Creatinine, Ser: 1.13 mg/dL — ABNORMAL HIGH (ref 0.44–1.00)
GFR calc Af Amer: 55 mL/min — ABNORMAL LOW (ref >60)
GFR calc non Af Amer: 47 mL/min — ABNORMAL LOW (ref >60)
Glucose, Bld: 143 mg/dL — ABNORMAL HIGH (ref 65–99)
POTASSIUM: 4.6 mmol/L (ref 3.5–5.1)
SODIUM: 133 mmol/L — AB (ref 135–145)

## 2015-10-30 LAB — CBC
HEMATOCRIT: 32.9 % — AB (ref 36.0–46.0)
HEMOGLOBIN: 10.5 g/dL — AB (ref 12.0–15.0)
MCH: 28.3 pg (ref 26.0–34.0)
MCHC: 31.9 g/dL (ref 30.0–36.0)
MCV: 88.7 fL (ref 78.0–100.0)
Platelets: 236 10*3/uL (ref 150–400)
RBC: 3.71 MIL/uL — AB (ref 3.87–5.11)
RDW: 15.7 % — ABNORMAL HIGH (ref 11.5–15.5)
WBC: 11.6 10*3/uL — ABNORMAL HIGH (ref 4.0–10.5)

## 2015-10-30 MED ORDER — POLYETHYLENE GLYCOL 3350 17 G PO PACK: 17.0000 g | PACK | Freq: Two times a day (BID) | ORAL | Status: DC

## 2015-10-30 MED ORDER — PROMETHAZINE HCL 12.5 MG PO TABS
12.5000 mg | ORAL_TABLET | Freq: Four times a day (QID) | ORAL | Status: DC | PRN
Start: 2015-10-30 — End: 2017-04-19

## 2015-10-30 MED ORDER — FERROUS SULFATE 325 (65 FE) MG PO TABS: 325.0000 mg | ORAL_TABLET | Freq: Three times a day (TID) | ORAL | Status: DC

## 2015-10-30 MED ORDER — HYDROCODONE-ACETAMINOPHEN 7.5-325 MG PO TABS: 1.0000 | ORAL_TABLET | ORAL | Status: DC | PRN

## 2015-10-30 MED ORDER — PROMETHAZINE HCL 25 MG/ML IJ SOLN
6.2500 mg | Freq: Four times a day (QID) | INTRAMUSCULAR | Status: DC | PRN
  Administered 2015-10-30: 12.5 mg via INTRAVENOUS
  Filled 2015-10-30: qty 1

## 2015-10-30 MED ORDER — TIZANIDINE HCL 4 MG PO TABS: 4.0000 mg | ORAL_TABLET | Freq: Four times a day (QID) | ORAL | Status: DC | PRN

## 2015-10-30 MED ORDER — DOCUSATE SODIUM 100 MG PO CAPS: 100.0000 mg | ORAL_CAPSULE | Freq: Two times a day (BID) | ORAL | Status: DC

## 2015-10-30 MED ORDER — ASPIRIN 325 MG PO TBEC: 325.0000 mg | DELAYED_RELEASE_TABLET | Freq: Two times a day (BID) | ORAL | Status: AC

## 2015-10-30 NOTE — Progress Notes (Signed)
PT Cancellation Note  Patient Details Name: [name] [name] MRN: [id number] DOB: 1941-02-17   Cancelled Treatment:    Reason Eval/Treat Not Completed: Patient declined, no reason specified (declined due to nausea, reports some emesis this morning.)   Lennis Rader,KATHrine E 10/30/2015, 10:08 AM Carmelia Bake, PT, DPT 10/30/2015 Pager: [phone]

## 2015-10-30 NOTE — Discharge Summary (Signed)
Physician Discharge Summary  Patient ID: [name] [name] MRN: [id number] DOB/AGE: 72-Sep-1942 72 y.o.  Admit date: 10/28/2015 Discharge date:  10/30/2015  Procedures:  Procedure(s) (LRB): RIGHT TOTAL HIP ARTHROPLASTY ANTERIOR APPROACH (Right)  Attending Physician:  Dr. Paralee Cancel   Admission Diagnoses:   Right hip primary OA / pain  Discharge Diagnoses:  Principal Problem:   S/P right THA, AA Active Problems:   Obese  Past Medical History  Diagnosis Date  . Asthma   . Hypertension   . Cerebrovascular accident [facility])     R thalamic CVA 01/2010  . Hyperlipemia   . Dyslipidemia   . TIA (transient ischemic attack)     x2 in 2011  . Low back pain   . Osteoarthritis   . Vertigo 2014  . Anxiety     hx of  . Breast cancer ([place])     2002; metastatic in 2012, right breast, spread to left hip in 2012    HPI:    [name] [name], 74 y.o. female, has a history of pain and functional disability in the right hip(s) due to arthritis and patient has failed non-surgical conservative treatments for greater than 12 weeks to include NSAID's and/or analgesics, use of assistive devices and activity modification. Onset of symptoms was gradual starting years ago with rapidlly worsening course since that time.The patient noted prior procedures of the hip to include arthroplasty on the left hip for an impending pathologic fracture on 04/01/2011 per Dr. Alvan Dame. Patient currently rates pain in the right hip at 8 out of 10 with activity. Patient has worsening of pain with activity and weight bearing, trendelenberg gait, pain that interfers with activities of daily living and pain with passive range of motion. Patient has evidence of periarticular osteophytes and joint space narrowing by imaging studies. This condition presents safety issues increasing the risk of falls. There is no current active infection. Risks, benefits and expectations were discussed with the patient. Risks including but not  limited to the risk of anesthesia, blood clots, nerve damage, blood vessel damage, failure of the prosthesis, infection and up to and including death. Patient understand the risks, benefits and expectations and wishes to proceed with surgery.   PCP: Walker Kehr, MD   Discharged Condition: good  Hospital Course:  Patient underwent the above stated procedure on 10/28/2015. Patient tolerated the procedure well and brought to the recovery room in good condition and subsequently to the floor.  POD #1 BP: 120/77 ; Pulse: 86 ; Temp: 98.2 F (36.8 C) ; Resp: 20 Patient reports pain as mild, pain controlled. No events throughout the night.  Dorsiflexion/plantar flexion intact, incision: dressing C/D/I, no cellulitis present and compartment soft.   LABS  Basename    HGB     10.4  HCT     31.9   POD #2  BP: 136/98 ; Pulse: 80 ; Temp: 97.9 F (36.6 C) ; Resp: 16 Patient reports pain as mild, pain controlled. No events throughout the night. This morning however she had some nausea, in general however she feels that she is doing well. Dorsiflexion/plantar flexion intact, incision: dressing C/D/I, no cellulitis present and compartment soft.   LABS  Basename    HGB     10.5  HCT     32.9    Discharge Exam: General appearance: alert, cooperative and no distress Extremities: Homans sign is negative, no sign of DVT, no edema, redness or tenderness in the calves or thighs and no ulcers, gangrene or  trophic changes  Disposition:    Skilled nursing facility with follow up in 2 weeks   Follow-up Information    Follow up with [facility] .   Specialty:  [place] information:   [address] [place] [id number]      Follow up with Mauri Pole, MD. Schedule an appointment as soon as possible for a visit in 2 weeks.   Specialty:  Orthopedic Surgery   Contact information:   [address] [place]  [id number]       Discharge Instructions    Call MD / Call 911    Complete by:  As directed   If you experience chest pain or shortness of breath, CALL 911 and be transported to the hospital emergency room.  If you develope a fever above 101 F, pus (white drainage) or increased drainage or redness at the wound, or calf pain, call your surgeon's office.     Change dressing    Complete by:  As directed   Maintain surgical dressing until follow up in the clinic. If the edges start to pull up, may reinforce with tape. If the dressing is no longer working, may remove and cover with gauze and tape, but must keep the area dry and clean.  Call with any questions or concerns.     Constipation Prevention    Complete by:  As directed   Drink plenty of fluids.  Prune juice may be helpful.  You may use a stool softener, such as Colace (over the counter) 100 mg twice a day.  Use MiraLax (over the counter) for constipation as needed.     Diet - low sodium heart healthy    Complete by:  As directed      Discharge instructions    Complete by:  As directed   Maintain surgical dressing until follow up in the clinic. If the edges start to pull up, may reinforce with tape. If the dressing is no longer working, may remove and cover with gauze and tape, but must keep the area dry and clean.  Follow up in 2 weeks at [facility]. Call with any questions or concerns.     Increase activity slowly as tolerated    Complete by:  As directed   Weight bearing as tolerated with assist device (walker, cane, etc) as directed, use it as long as suggested by your surgeon or therapist, typically at least 4-6 weeks.     TED hose    Complete by:  As directed   Use stockings (TED hose) for 2 weeks on both leg(s).  You may remove them at night for sleeping.             Medication List    STOP taking these medications        acetaminophen 500 MG tablet  Commonly known as:  TYLENOL      acetaminophen-codeine 300-15 MG tablet  Commonly known as:  TYLENOL #2     meloxicam 7.5 MG tablet  Commonly known as:  MOBIC      TAKE these medications        amLODipine 10 MG tablet  Commonly known as:  NORVASC  Take 1 tablet (10 mg total) by mouth daily.     anastrozole 1 MG tablet  Commonly known as:  ARIMIDEX  Take 1 tablet (1 mg total) by mouth daily.     aspirin 325 MG EC tablet  Take 1 tablet (325 mg total) by mouth 2 (two) times daily.     docusate sodium 100 MG capsule  Commonly known as:  COLACE  Take 1 capsule (100 mg total) by mouth 2 (two) times daily.     ferrous sulfate 325 (65 FE) MG tablet  Take 1 tablet (325 mg total) by mouth 3 (three) times daily after meals.     HYDROcodone-acetaminophen 7.5-325 MG tablet  Commonly known as:  NORCO  Take 1-2 tablets by mouth every 4 (four) hours as needed for moderate pain.     losartan-hydrochlorothiazide 100-25 MG tablet  Commonly known as:  HYZAAR  Take 1 tablet by mouth daily.     meclizine 12.5 MG tablet  Commonly known as:  ANTIVERT  Take 1 tablet (12.5 mg total) by mouth 2 (two) times daily as needed for dizziness.     polyethylene glycol packet  Commonly known as:  MIRALAX / GLYCOLAX  Take 17 g by mouth 2 (two) times daily.     promethazine 12.5 MG tablet  Commonly known as:  PHENERGAN  Take 1 tablet (12.5 mg total) by mouth every 6 (six) hours as needed for nausea or vomiting.     tiZANidine 4 MG tablet  Commonly known as:  ZANAFLEX  Take 1 tablet (4 mg total) by mouth every 6 (six) hours as needed for muscle spasms.     Vitamin D3 1000 units Caps  Take 1 capsule by mouth daily.         Signed: [place] Pugh. Niraj Kudrna   PA-C  10/30/2015, 11:06 AM

## 2015-10-30 NOTE — Clinical Social Work Placement (Signed)
   CLINICAL SOCIAL WORK PLACEMENT  NOTE  Date:  10/30/2015  Patient Details  Name: [name] [name] MRN: [id number] Date of Birth: [birth year]  Clinical Social Work is seeking post-discharge placement for this patient at the [place] level of care (*CSW will initial, date and re-position this form in  chart as items are completed):  Yes   Patient/family provided with [facility] Work Department's list of facilities offering this level of care within the geographic area requested by the patient (or if unable, by the patient's family).  Yes   Patient/family informed of their freedom to choose among providers that offer the needed level of care, that participate in Medicare, Medicaid or managed care program needed by the patient, have an available bed and are willing to accept the patient.  Yes   Patient/family informed of [facility]'s ownership interest in [facility] and [facility], as well as of the fact that they are under no obligation to receive care at these facilities.  PASRR submitted to EDS on       PASRR number received on       Existing PASRR number confirmed on 10/28/15     FL2 transmitted to all facilities in geographic area requested by pt/family on 10/28/15     FL2 transmitted to all facilities within larger geographic area on       Patient informed that his/her managed care company has contracts with or will negotiate with certain facilities, including the following:        Yes   Patient/family informed of bed offers received.  Patient chooses bed at [facility]     Physician recommends and patient chooses bed at      Patient to be transferred to [facility] on 10/23/15.  Patient to be transferred to facility by [place]     Patient family notified on 10/30/15 of transfer.  Name of family member notified:  Pt contacted family directly.     PHYSICIAN       Additional Comment: Pt / family are in agreement with d/c to  [facility] today. PT approved transport by car. [employer] medicare provided authorization for SNF placement. NSG reviewed d/c summary, scripts, avs. Scripts included ind/c packet. D/C Summary sent to SNF for review prior to d/c.  _______________________________________________ Luretha Rued, [place] 10/30/2015, 3:21 PM

## 2015-10-30 NOTE — Discharge Instructions (Signed)

## 2015-10-30 NOTE — Progress Notes (Signed)
Physical Therapy Treatment Patient Details Name: [name] [name] MRN: [id number] DOB: September 19, 1941 Today's Date: 11-13-15    History of Present Illness Pt is a 74 year old female s/p R THA with direct anterior approach with hx of CVA, low back pain, breast cancer, and left hip arthroplasty for an impending pathologic fracture on 04/01/2011 per Dr. Alvan Dame    PT Comments    Pt assisted with ambulating in hallway and also discussed safe car transfer.  Pt likely to d/c to SNF, going by car with family.  Follow Up Recommendations  SNF     Equipment Recommendations  None recommended by PT    Recommendations for Other Services       Precautions / Restrictions Precautions Precautions: Fall Precaution Comments: direct anterior approach Restrictions Other Position/Activity Restrictions: WBAT    Mobility  Bed Mobility               General bed mobility comments: pt up in recliner on arrival  Transfers Overall transfer level: Needs assistance Equipment used: Rolling walker (2 wheeled) Transfers: Sit to/from Stand Sit to Stand: Min guard         General transfer comment: verbal cues for UE and LE positioning  Ambulation/Gait Ambulation/Gait assistance: Min guard Ambulation Distance (Feet): 140 Feet Assistive device: Rolling walker (2 wheeled) Gait Pattern/deviations: Step-to pattern;Antalgic;Trunk flexed Gait velocity: decreased   General Gait Details: verbal cues for step length, RW positioning, posture   Stairs            Wheelchair Mobility    Modified Rankin (Stroke Patients Only)       Balance                                    Cognition Arousal/Alertness: Awake/alert Behavior During Therapy: WFL for tasks assessed/performed Overall Cognitive Status: Within Functional Limits for tasks assessed                      Exercises      General Comments        Pertinent Vitals/Pain Pain Assessment: 0-10 Pain Score: 5   Pain Location: R hip Pain Descriptors / Indicators: Burning Pain Intervention(s): Limited activity within patient's tolerance;Monitored during session;Repositioned    Home Living                      Prior Function            PT Goals (current goals can now be found in the care plan section) Progress towards PT goals: Progressing toward goals    Frequency  7X/week    PT Plan Current plan remains appropriate    Co-evaluation             End of Session   Activity Tolerance: Patient tolerated treatment well Patient left: in chair;with call bell/phone within reach     Time: 1205-1222 PT Time Calculation (min) (ACUTE ONLY): 17 min  Charges:  $Gait Training: 8-22 mins                    G Codes:      Macari Zalesky,KATHrine E 11-13-2015, 12:42 PM Carmelia Bake, PT, DPT November 13, 2015 Pager: [phone]

## 2015-10-30 NOTE — Progress Notes (Signed)
     Subjective: 2 Days Post-Op Procedure(s) (LRB): RIGHT TOTAL HIP ARTHROPLASTY ANTERIOR APPROACH (Right)   Patient reports pain as mild, pain controlled. No events throughout the night.  This morning however she had some nausea, in general however she feels that she is doing well.  Objective:   VITALS:   Filed Vitals:   10/30/15 0500 10/30/15 0839  BP: 132/45 136/98  Pulse: 80 80  Temp: 97.9 F (36.6 C)   Resp: 16     Dorsiflexion/Plantar flexion intact Incision: dressing C/D/I No cellulitis present Compartment soft  LABS  Recent Labs  10/29/15 0420 10/30/15 0420  HGB 10.4* 10.5*  HCT 31.9* 32.9*  WBC 8.7 11.6*  PLT 223 236     Recent Labs  10/29/15 0420 10/30/15 0420  NA 132* 133*  K 4.1 4.6  BUN 16 25*  CREATININE 1.09* 1.13*  GLUCOSE 136* 143*     Assessment/Plan: 2 Days Post-Op Procedure(s) (LRB): RIGHT TOTAL HIP ARTHROPLASTY ANTERIOR APPROACH (Right)  Up with therapy Discharge to SNF, if nausea resolves. Follow up in 2 weeks at [facility]. Follow up with OLIN,Zyrus Hetland D in 2 weeks.  Contact information:  [facility] [address] [place] Bettyjean Stefanski   PAC  10/30/2015, 10:35 AM

## 2015-11-06 ENCOUNTER — Other Ambulatory Visit: Payer: Medicare Other

## 2015-11-10 ENCOUNTER — Ambulatory Visit: Payer: Medicare Other

## 2015-11-10 ENCOUNTER — Ambulatory Visit: Payer: Medicare Other | Admitting: Internal Medicine

## 2015-11-20 DIAGNOSIS — E119 Type 2 diabetes mellitus without complications: Secondary | ICD-10-CM | POA: Diagnosis not present

## 2015-11-20 DIAGNOSIS — H8110 Benign paroxysmal vertigo, unspecified ear: Secondary | ICD-10-CM | POA: Diagnosis not present

## 2015-11-20 DIAGNOSIS — J45909 Unspecified asthma, uncomplicated: Secondary | ICD-10-CM | POA: Diagnosis not present

## 2015-11-20 DIAGNOSIS — M1990 Unspecified osteoarthritis, unspecified site: Secondary | ICD-10-CM | POA: Diagnosis not present

## 2015-11-20 DIAGNOSIS — I10 Essential (primary) hypertension: Secondary | ICD-10-CM | POA: Diagnosis not present

## 2015-11-20 DIAGNOSIS — Z471 Aftercare following joint replacement surgery: Secondary | ICD-10-CM | POA: Diagnosis not present

## 2015-11-24 ENCOUNTER — Other Ambulatory Visit: Payer: Self-pay | Admitting: *Deleted

## 2015-11-24 ENCOUNTER — Telehealth: Payer: Self-pay | Admitting: *Deleted

## 2015-11-24 MED ORDER — AMLODIPINE BESYLATE 10 MG PO TABS: 10.0000 mg | ORAL_TABLET | Freq: Every day | ORAL | Status: DC

## 2015-11-24 NOTE — Telephone Encounter (Signed)
Brianna with central scheduling called after receiving call from Ms. Standiford to reschedule scans.    "Patient having hip replaced by end of January.  Called to reschedule CT and bone scan to same day due to surgery, inability to walk and no transportation.  I need the expiration of CT changed to reschedule and she needs to reschedule her 12-03-2015 lab appointment and the 12-08-2015 MD, Injection appointments at [facility]  Scans are scheduled for 12-29-2015 per her request."

## 2015-11-24 NOTE — Telephone Encounter (Signed)
Reviewed with MD, ok to move lab/MD appt until after pt has healed or after scans. POF to scheduling.

## 2015-11-26 ENCOUNTER — Telehealth: Payer: Self-pay | Admitting: Internal Medicine

## 2015-11-26 NOTE — Telephone Encounter (Signed)
Appointments moved per patient request for after CT scan. Patient aware.

## 2015-12-03 ENCOUNTER — Ambulatory Visit (HOSPITAL_COMMUNITY): Payer: Medicare Other

## 2015-12-03 ENCOUNTER — Encounter (HOSPITAL_COMMUNITY): Payer: Medicare Other

## 2015-12-03 ENCOUNTER — Other Ambulatory Visit: Payer: Self-pay | Admitting: Medical Oncology

## 2015-12-03 ENCOUNTER — Other Ambulatory Visit: Payer: Medicare Other

## 2015-12-05 ENCOUNTER — Other Ambulatory Visit: Payer: Self-pay | Admitting: *Deleted

## 2015-12-05 DIAGNOSIS — C7951 Secondary malignant neoplasm of bone: Principal | ICD-10-CM

## 2015-12-05 DIAGNOSIS — C50919 Malignant neoplasm of unspecified site of unspecified female breast: Secondary | ICD-10-CM

## 2015-12-08 ENCOUNTER — Ambulatory Visit: Payer: Medicare Other | Admitting: Internal Medicine

## 2015-12-08 ENCOUNTER — Ambulatory Visit: Payer: Medicare Other

## 2015-12-09 ENCOUNTER — Telehealth: Payer: Self-pay | Admitting: Internal Medicine

## 2015-12-09 NOTE — Telephone Encounter (Signed)
Per 2/3 pof moved 3/6 lab to 2/27 with scans. Spoke with patient she is aware - confirmed lab/scans 2/27 @ 8:15 am (lab) and MM/inj 3/6 @ 10:45 am.

## 2015-12-29 ENCOUNTER — Encounter (HOSPITAL_COMMUNITY): Payer: Self-pay

## 2015-12-29 ENCOUNTER — Other Ambulatory Visit (HOSPITAL_BASED_OUTPATIENT_CLINIC_OR_DEPARTMENT_OTHER): Payer: Medicare Other

## 2015-12-29 ENCOUNTER — Ambulatory Visit (HOSPITAL_COMMUNITY)
Admission: RE | Admit: 2015-12-29 | Discharge: 2015-12-29 | Disposition: A | Discharge status: Home / Self Care | Payer: Medicare Other | Source: Ambulatory Visit | Attending: Internal Medicine | Admitting: Internal Medicine

## 2015-12-29 ENCOUNTER — Encounter (HOSPITAL_COMMUNITY)
Admission: RE | Admit: 2015-12-29 | Discharge: 2015-12-29 | Disposition: A | Discharge status: Home / Self Care | Payer: Medicare Other | Source: Ambulatory Visit | Attending: Internal Medicine | Admitting: Internal Medicine

## 2015-12-29 DIAGNOSIS — C50911 Malignant neoplasm of unspecified site of right female breast: Secondary | ICD-10-CM

## 2015-12-29 DIAGNOSIS — I2510 Atherosclerotic heart disease of native coronary artery without angina pectoris: Secondary | ICD-10-CM | POA: Diagnosis not present

## 2015-12-29 DIAGNOSIS — C50919 Malignant neoplasm of unspecified site of unspecified female breast: Secondary | ICD-10-CM

## 2015-12-29 DIAGNOSIS — Z96643 Presence of artificial hip joint, bilateral: Secondary | ICD-10-CM | POA: Insufficient documentation

## 2015-12-29 DIAGNOSIS — R599 Enlarged lymph nodes, unspecified: Secondary | ICD-10-CM | POA: Insufficient documentation

## 2015-12-29 DIAGNOSIS — C7951 Secondary malignant neoplasm of bone: Secondary | ICD-10-CM

## 2015-12-29 DIAGNOSIS — K8020 Calculus of gallbladder without cholecystitis without obstruction: Secondary | ICD-10-CM | POA: Insufficient documentation

## 2015-12-29 LAB — COMPREHENSIVE METABOLIC PANEL
ALBUMIN: 3.6 g/dL (ref 3.5–5.0)
ALT: 9 U/L (ref 0–55)
AST: 16 U/L (ref 5–34)
Alkaline Phosphatase: 92 U/L (ref 40–150)
Anion Gap: 12 mEq/L — ABNORMAL HIGH (ref 3–11)
BILIRUBIN TOTAL: 0.33 mg/dL (ref 0.20–1.20)
BUN: 22.3 mg/dL (ref 7.0–26.0)
CHLORIDE: 106 meq/L (ref 98–109)
CO2: 24 mEq/L (ref 22–29)
CREATININE: 1.6 mg/dL — AB (ref 0.6–1.1)
Calcium: 10.1 mg/dL (ref 8.4–10.4)
EGFR: 36 mL/min/{1.73_m2} — ABNORMAL LOW (ref >90)
Glucose: 93 mg/dl (ref 70–140)
Potassium: 3.9 mEq/L (ref 3.5–5.1)
SODIUM: 141 meq/L (ref 136–145)
Total Protein: 8.3 g/dL (ref 6.4–8.3)

## 2015-12-29 LAB — CBC WITH DIFFERENTIAL/PLATELET
BASO%: 0.7 % (ref 0.0–2.0)
BASOS ABS: 0 10*3/uL (ref 0.0–0.1)
EOS ABS: 0.2 10*3/uL (ref 0.0–0.5)
EOS%: 4.6 % (ref 0.0–7.0)
HCT: 37.7 % (ref 34.8–46.6)
HGB: 12.3 g/dL (ref 11.6–15.9)
LYMPH%: 23.5 % (ref 14.0–49.7)
MCH: 29 pg (ref 25.1–34.0)
MCHC: 32.5 g/dL (ref 31.5–36.0)
MCV: 89 fL (ref 79.5–101.0)
MONO#: 0.5 10*3/uL (ref 0.1–0.9)
MONO%: 9.5 % (ref 0.0–14.0)
NEUT#: 3 10*3/uL (ref 1.5–6.5)
NEUT%: 61.7 % (ref 38.4–76.8)
Platelets: 284 10*3/uL (ref 145–400)
RBC: 4.24 10*6/uL (ref 3.70–5.45)
RDW: 15.7 % — ABNORMAL HIGH (ref 11.2–14.5)
WBC: 4.8 10*3/uL (ref 3.9–10.3)
lymph#: 1.1 10*3/uL (ref 0.9–3.3)

## 2015-12-29 MED ORDER — TECHNETIUM TC 99M MEDRONATE IV KIT
25.0000 | PACK | Freq: Once | INTRAVENOUS | Status: AC | PRN
  Administered 2015-12-29: 25 via INTRAVENOUS

## 2015-12-29 MED ORDER — IOHEXOL 300 MG/ML  SOLN
100.0000 mL | Freq: Once | INTRAMUSCULAR | Status: AC | PRN
  Administered 2015-12-29: 80 mL via INTRAVENOUS

## 2015-12-30 LAB — CANCER ANTIGEN 27-29 (PARALLEL TESTING): CA 27.29: 45 U/mL — AB (ref <38)

## 2015-12-30 LAB — CANCER ANTIGEN 27.29: CAN 27.29: 43.3 U/mL — AB (ref 0.0–38.6)

## 2015-12-31 ENCOUNTER — Other Ambulatory Visit: Payer: Medicare Other

## 2016-01-05 ENCOUNTER — Telehealth: Payer: Self-pay | Admitting: Internal Medicine

## 2016-01-05 ENCOUNTER — Encounter: Payer: Self-pay | Admitting: Internal Medicine

## 2016-01-05 ENCOUNTER — Ambulatory Visit (HOSPITAL_BASED_OUTPATIENT_CLINIC_OR_DEPARTMENT_OTHER): Payer: Medicare Other | Admitting: Internal Medicine

## 2016-01-05 ENCOUNTER — Ambulatory Visit (HOSPITAL_BASED_OUTPATIENT_CLINIC_OR_DEPARTMENT_OTHER): Payer: Medicare Other

## 2016-01-05 VITALS — BP 152/61 | HR 79 | Temp 97.9°F | Resp 18 | Ht 63.0 in | Wt 221.5 lb

## 2016-01-05 DIAGNOSIS — C7951 Secondary malignant neoplasm of bone: Secondary | ICD-10-CM

## 2016-01-05 DIAGNOSIS — C50911 Malignant neoplasm of unspecified site of right female breast: Secondary | ICD-10-CM | POA: Diagnosis not present

## 2016-01-05 DIAGNOSIS — R0609 Other forms of dyspnea: Secondary | ICD-10-CM | POA: Diagnosis not present

## 2016-01-05 MED ORDER — DENOSUMAB 120 MG/1.7ML ~~LOC~~ SOLN
120.0000 mg | Freq: Once | SUBCUTANEOUS | Status: AC
  Administered 2016-01-05: 120 mg via SUBCUTANEOUS
  Filled 2016-01-05: qty 1.7

## 2016-01-05 NOTE — Progress Notes (Signed)
[place] [phone]) [phone]   [phone]  OFFICE PROGRESS NOTE  Pcp Not In System No address on file  DIAGNOSIS AND STAGE: Metastatic breast adenocarcinoma, presenting with bone metastasis in the left hip area. This was initially diagnosed as stage II (T2, N1, M0) invasive right breast carcinoma with positive estrogen and progesterone receptors and HER-2/neu positive in July 2002   PRIOR THERAPY:  1. Status post right lumpectomy with right axillary lymph node dissection revealing 4/9 lymph nodes that were positive for malignancy.  2. Status post 8 cycles of systemic chemotherapy with CMF, completed March 2003.  3. Status post radiotherapy to the remaining right breast under the care of Dr. Tammi Klippel.  4. Status post 2 years of treatment with tamoxifen, started July 2003, then switched to Femara by Dr. Humphrey Rolls in August 2005. The patient discontinued the treatment in February 2009.  5. Status post left total hip replacement on Apr 01, 2011, secondary to pathologic left femoral neck fracture. 6. Zometa 4 mg IV on 3 monthly basis. Discontinued secondary to renal insufficiency.   CURRENT THERAPY:  1. Arimidex 1 mg p.o. daily, started 03/18/2011.  2. Xgeva 120 mcg subcutaneously on 3 monthly basis.  CHEMOTHERAPY INTENT: Palliative  CURRENT # OF HORMONAL THERAPY CYCLES: 62 CURRENT ANTIEMETICS: None  CURRENT SMOKING STATUS: Nonsmoker  ORAL HORMONAL THERAPY AND CONSENT: Yes.  CURRENT BISPHOSPHONATES USE: Zometa with stable renal function.  PAIN MANAGEMENT: Tramadol when necessary  NARCOTICS INDUCED CONSTIPATION: None  LIVING WILL AND CODE STATUS: Full code.   INTERVAL HISTORY:  [name] [name] 75 y.o. female returns to the clinic today for followup visit. The patient was last seen in October 2016. She again missed several of her follow-up appointment as well as the Xgeva injection. She underwent hip replacement and was undergoing physical therapy. She has  no significant weight loss or night sweats. She has no nausea or vomiting. The patient denied having any significant chest pain, but has shortness of breath with exertion with no cough or hemoptysis. She is tolerating her treatment with Arimidex fairly well. She is also on treatment with Xgeva every 3 months. She had repeat CT scan of the chest, abdomen and pelvis as well as bone scan and she is here today for reevaluation and to resume Xgeva injection.  MEDICAL HISTORY: Past Medical History  Diagnosis Date  . Asthma   . Hypertension   . Cerebrovascular accident [facility])     R thalamic CVA 01/2010  . Hyperlipemia   . Dyslipidemia   . TIA (transient ischemic attack)     x2 in 2011  . Low back pain   . Osteoarthritis   . Vertigo 2014  . Anxiety     hx of  . Breast cancer ([place])     2002; metastatic in 2012, right breast, spread to left hip in 2012    ALLERGIES:  has No Known Allergies.  MEDICATIONS:  Current Outpatient Prescriptions  Medication Sig Dispense Refill  . amLODipine (NORVASC) 10 MG tablet Take 1 tablet (10 mg total) by mouth daily. 90 tablet 2  . anastrozole (ARIMIDEX) 1 MG tablet Take 1 tablet (1 mg total) by mouth daily. 90 tablet 2  . Cholecalciferol (VITAMIN D3) 1000 UNITS CAPS Take 1 capsule by mouth daily.     Marland Kitchen docusate sodium (COLACE) 100 MG capsule Take 1 capsule (100 mg total) by mouth 2 (two) times daily. 10 capsule 0  . ferrous sulfate 325 (65  FE) MG tablet Take 1 tablet (325 mg total) by mouth 3 (three) times daily after meals.  3  . HYDROcodone-acetaminophen (NORCO) 7.5-325 MG tablet Take 1-2 tablets by mouth every 4 (four) hours as needed for moderate pain. 100 tablet 0  . losartan-hydrochlorothiazide (HYZAAR) 100-25 MG per tablet Take 1 tablet by mouth daily. 90 tablet 3  . meclizine (ANTIVERT) 12.5 MG tablet Take 1 tablet (12.5 mg total) by mouth 2 (two) times daily as needed for dizziness. 10 tablet 0  . polyethylene glycol (MIRALAX / GLYCOLAX) packet Take  17 g by mouth 2 (two) times daily. 14 each 0  . promethazine (PHENERGAN) 12.5 MG tablet Take 1 tablet (12.5 mg total) by mouth every 6 (six) hours as needed for nausea or vomiting. 30 tablet 0  . tiZANidine (ZANAFLEX) 4 MG tablet Take 1 tablet (4 mg total) by mouth every 6 (six) hours as needed for muscle spasms. 40 tablet 0  . acetaminophen-codeine (TYLENOL #2) 300-15 MG tablet TAKE 1-2 TABLETS BY MOUTH EVERY 4 HOURS AS NEEDED FOR SEVERE PAIN  1   No current facility-administered medications for this visit.    SURGICAL HISTORY:  Past Surgical History  Procedure Laterality Date  . Abdominal hysterectomy      complete  . Breast lumpectomy      Right breast 2002  . Joint replacement  2012    L hip due to breast ca met  . Total hip arthroplasty Right 10/28/2015    Procedure: RIGHT TOTAL HIP ARTHROPLASTY ANTERIOR APPROACH;  Surgeon: Paralee Cancel, MD;  Location: [facility];  Service: Orthopedics;  Laterality: Right;    REVIEW OF SYSTEMS:  Constitutional: negative Eyes: negative Ears, nose, mouth, throat, and face: negative Respiratory: positive for dyspnea on exertion Cardiovascular: negative Gastrointestinal: negative Genitourinary:negative Integument/breast: negative Hematologic/lymphatic: negative Musculoskeletal:positive for arthralgias Neurological: negative Behavioral/Psych: negative Endocrine: negative Allergic/Immunologic: negative   PHYSICAL EXAMINATION: General appearance: alert, cooperative, fatigued and no distress Head: Normocephalic, without obvious abnormality, atraumatic Neck: no adenopathy, no JVD, supple, symmetrical, trachea midline and thyroid not enlarged, symmetric, no tenderness/mass/nodules Lymph nodes: Cervical, supraclavicular, and axillary nodes normal. Resp: clear to auscultation bilaterally Back: symmetric, no curvature. ROM normal. No CVA tenderness. Cardio: regular rate and rhythm, S1, S2 normal, no murmur, click, rub or gallop GI: soft, non-tender;  bowel sounds normal; no masses,  no organomegaly Extremities: extremities normal, atraumatic, no cyanosis or edema Neurologic: Alert and oriented X 3, normal strength and tone. Normal symmetric reflexes. Normal coordination and gait  ECOG PERFORMANCE STATUS: 1 - Symptomatic but completely ambulatory  Blood pressure 152/61, pulse 79, temperature 97.9 F (36.6 C), temperature source Oral, resp. rate 18, height 5' 3"  (1.6 m), weight 221 lb 8 oz (100.472 kg), SpO2 100 %.  LABORATORY DATA: Lab Results  Component Value Date   WBC 4.8 12/29/2015   HGB 12.3 12/29/2015   HCT 37.7 12/29/2015   MCV 89.0 12/29/2015   PLT 284 12/29/2015      Chemistry      Component Value Date/Time   NA 141 12/29/2015 0820   NA 133* 10/30/2015 0420   NA 140 05/03/2012 1449   K 3.9 12/29/2015 0820   K 4.6 10/30/2015 0420   K 4.3 05/03/2012 1449   CL 100* 10/30/2015 0420   CL 109* 04/25/2013 1023   CL 99 05/03/2012 1449   CO2 24 12/29/2015 0820   CO2 26 10/30/2015 0420   CO2 27 05/03/2012 1449   BUN 22.3 12/29/2015 0820   BUN 25*  10/30/2015 0420   BUN 14 05/03/2012 1449   CREATININE 1.6* 12/29/2015 0820   CREATININE 1.13* 10/30/2015 0420   CREATININE 1.1 05/03/2012 1449      Component Value Date/Time   CALCIUM 10.1 12/29/2015 0820   CALCIUM 9.2 10/30/2015 0420   CALCIUM 9.1 05/03/2012 1449   ALKPHOS 92 12/29/2015 0820   ALKPHOS 95 06/29/2014 0835   ALKPHOS 78 05/03/2012 1449   AST 16 12/29/2015 0820   AST 14 06/29/2014 0835   AST 19 05/03/2012 1449   ALT 9 12/29/2015 0820   ALT 8 06/29/2014 0835   ALT 14 05/03/2012 1449   BILITOT 0.33 12/29/2015 0820   BILITOT 0.4 06/29/2014 0835   BILITOT 0.50 05/03/2012 1449      RADIOGRAPHIC STUDIES: Ct Chest W Contrast  12/29/2015  CLINICAL DATA:  Right breast cancer with right lumpectomy. EXAM: CT CHEST, ABDOMEN, AND PELVIS WITH CONTRAST TECHNIQUE: Multidetector CT imaging of the chest, abdomen and pelvis was performed following the standard  protocol during bolus administration of intravenous contrast. CONTRAST:  73m OMNIPAQUE IOHEXOL 300 MG/ML  SOLN COMPARISON:  From 09/04/2014. FINDINGS: CT CHEST FINDINGS Mediastinum/Lymph Nodes: There is no axillary lymphadenopathy. Surgical clips again noted in the right axilla with associated scarring. No mediastinal lymphadenopathy. 11 mm right infrahilar lymph node (image 32 series 2 was only 5 mm previously. No left hilar lymphadenopathy. The heart size is normal. No pericardial effusion. Coronary artery calcification is noted. The esophagus has normal imaging features. Stable 9 mm left thyroid nodule left Port-A-Cath remains in place with tip positioned at the distal SVC level. Probable retained cuff from previous tunneled central line in the left anterior chest wall. Lungs/Pleura: Probable anterior subpleural radiation fibrosis right lung. The basilar chronic atelectasis or scarring in the lower lobes is stable. No suspicious pulmonary nodule or mass. No focal airspace consolidation. No pleural effusion. Musculoskeletal: Bone windows reveal no worrisome lytic or sclerotic osseous lesions. CT ABDOMEN PELVIS FINDINGS Hepatobiliary: No focal abnormality within the liver parenchyma. Small calcified stones are seen in the gallbladder. No intrahepatic or extrahepatic biliary dilation. Pancreas: No focal mass lesion. No dilatation of the main duct. No intraparenchymal cyst. No peripancreatic edema. Spleen: No splenomegaly. No focal mass lesion. Adrenals/Urinary Tract: No adrenal nodule or mass. Scattered areas of mild cortical scarring are noted in the kidneys bilaterally. No enhancing renal mass lesion. No hydronephrosis. No evidence for hydroureter. The distal ureters and posterior bladder are obscured from streak artifact due to bilateral hip replacement. Stomach/Bowel: Small hiatal hernia noted. Stomach otherwise unremarkable. Duodenum is normally positioned as is the ligament of Treitz. No small bowel wall  thickening. No small bowel dilatation. Portions of the pelvic small bowel are obscured by streak artifact. The terminal ileum is normal. The appendix is normal. No gross colonic mass. No colonic wall thickening. No substantial diverticular change. Vascular/Lymphatic: There is abdominal aortic atherosclerosis without aneurysm. There is no gastrohepatic or hepatoduodenal ligament lymphadenopathy. No intraperitoneal or retroperitoneal lymphadenopathy. No pelvic sidewall lymphadenopathy. Reproductive: Uterus is surgically absent. There is no adnexal mass. Other: No intraperitoneal free fluid. Musculoskeletal: Patient is status post bilateral hip replacement Bone windows reveal no worrisome lytic or sclerotic osseous lesions. IMPRESSION: 1. Slight interval increase in a borderline enlarged right infrahilar lymph node. While this is indeterminate, close follow-up is recommended as metastatic disease cannot be entirely excluded. 2. Otherwise stable exam. 3. Coronary artery atherosclerosis. 4. Cholelithiasis 5. Interval right hip replacement with bilateral hip replacement no evident. Electronically Signed   By: ERandall Hiss  Tery Sanfilippo M.D.   On: 12/29/2015 13:02   Nm Bone Scan Whole Body  12/29/2015  CLINICAL DATA:  Breast cancer. EXAM: NUCLEAR MEDICINE WHOLE BODY BONE SCAN TECHNIQUE: Whole body anterior and posterior images were obtained approximately 3 hours after intravenous injection of radiopharmaceutical. RADIOPHARMACEUTICALS:  25.5 mCi Technetium-43mMDP IV COMPARISON:  CT 12/29/2015, PET-CT 09/04/2014. FINDINGS: Bilateral renal function and excretion. Multi focal areas of increased activity noted the lumbar spine and thoracic spine consistent with degenerative change again noted. Bilateral hip replacements. Focal area of increased activity noted about the right acetabulum, this could be secondary to the hip replacement. Loosening cannot be completely excluded. Focal bony lesion cannot be excluded. Left hip series suggested  for further evaluation. IMPRESSION: 1. Stable thoracolumbar spine activity most likely degenerative. 2. Focal area of increased activity noted over the left acetabulum. This may be secondary to right hip replacement. Loosening of the acetabular cup cannot be excluded. To exclude a focal lesion right hip series suggested . Electronically Signed   By: TMarcello Moores Register   On: 12/29/2015 13:21   Ct Abdomen Pelvis W Contrast  12/29/2015  CLINICAL DATA:  Right breast cancer with right lumpectomy. EXAM: CT CHEST, ABDOMEN, AND PELVIS WITH CONTRAST TECHNIQUE: Multidetector CT imaging of the chest, abdomen and pelvis was performed following the standard protocol during bolus administration of intravenous contrast. CONTRAST:  811mOMNIPAQUE IOHEXOL 300 MG/ML  SOLN COMPARISON:  From 09/04/2014. FINDINGS: CT CHEST FINDINGS Mediastinum/Lymph Nodes: There is no axillary lymphadenopathy. Surgical clips again noted in the right axilla with associated scarring. No mediastinal lymphadenopathy. 11 mm right infrahilar lymph node (image 32 series 2 was only 5 mm previously. No left hilar lymphadenopathy. The heart size is normal. No pericardial effusion. Coronary artery calcification is noted. The esophagus has normal imaging features. Stable 9 mm left thyroid nodule left Port-A-Cath remains in place with tip positioned at the distal SVC level. Probable retained cuff from previous tunneled central line in the left anterior chest wall. Lungs/Pleura: Probable anterior subpleural radiation fibrosis right lung. The basilar chronic atelectasis or scarring in the lower lobes is stable. No suspicious pulmonary nodule or mass. No focal airspace consolidation. No pleural effusion. Musculoskeletal: Bone windows reveal no worrisome lytic or sclerotic osseous lesions. CT ABDOMEN PELVIS FINDINGS Hepatobiliary: No focal abnormality within the liver parenchyma. Small calcified stones are seen in the gallbladder. No intrahepatic or extrahepatic  biliary dilation. Pancreas: No focal mass lesion. No dilatation of the main duct. No intraparenchymal cyst. No peripancreatic edema. Spleen: No splenomegaly. No focal mass lesion. Adrenals/Urinary Tract: No adrenal nodule or mass. Scattered areas of mild cortical scarring are noted in the kidneys bilaterally. No enhancing renal mass lesion. No hydronephrosis. No evidence for hydroureter. The distal ureters and posterior bladder are obscured from streak artifact due to bilateral hip replacement. Stomach/Bowel: Small hiatal hernia noted. Stomach otherwise unremarkable. Duodenum is normally positioned as is the ligament of Treitz. No small bowel wall thickening. No small bowel dilatation. Portions of the pelvic small bowel are obscured by streak artifact. The terminal ileum is normal. The appendix is normal. No gross colonic mass. No colonic wall thickening. No substantial diverticular change. Vascular/Lymphatic: There is abdominal aortic atherosclerosis without aneurysm. There is no gastrohepatic or hepatoduodenal ligament lymphadenopathy. No intraperitoneal or retroperitoneal lymphadenopathy. No pelvic sidewall lymphadenopathy. Reproductive: Uterus is surgically absent. There is no adnexal mass. Other: No intraperitoneal free fluid. Musculoskeletal: Patient is status post bilateral hip replacement Bone windows reveal no worrisome lytic or sclerotic osseous lesions. IMPRESSION:  1. Slight interval increase in a borderline enlarged right infrahilar lymph node. While this is indeterminate, close follow-up is recommended as metastatic disease cannot be entirely excluded. 2. Otherwise stable exam. 3. Coronary artery atherosclerosis. 4. Cholelithiasis 5. Interval right hip replacement with bilateral hip replacement no evident. Electronically Signed   By: Misty Stanley M.D.   On: 12/29/2015 13:02   ASSESSMENT AND PLAN: This is a very pleasant 75 years old [place] American female with metastatic breast adenocarcinoma with  bone metastasis. The patient is currently on hormonal therapy with Arimidex with no significant evidence for disease progression.  Her recent blood work as well as CT scan of the chest, abdomen and pelvis and bone scan showed no concerning finding for disease progression except for borderline enlarged right infrahilar lymph node. I discussed the scan results with the patient today. I recommended for the patient to continue her current treatment with Arimidex  The patient will also continue her current treatment with Xgeva 120 mcg subcutaneously every 3 months. She will receive a dose today. She will come back for follow-up visit in 3 months with repeat blood work. She will have Port-A-Cath flush every 6 weeks. She was advised to call immediately if she has any concerning symptoms in the interval. The patient voices understanding of current disease status and treatment options and is in agreement with the current care plan.  All questions were answered. The patient knows to call the clinic with any problems, questions or concerns. We can certainly see the patient much sooner if necessary.  Disclaimer: This note was dictated with voice recognition software. Similar sounding words can inadvertently be transcribed and may not be corrected upon review.

## 2016-01-05 NOTE — Telephone Encounter (Signed)
Gave and printed appt sched and avs for pt for June °

## 2016-03-02 ENCOUNTER — Ambulatory Visit: Payer: Medicare Other | Admitting: Internal Medicine

## 2016-03-02 ENCOUNTER — Ambulatory Visit (INDEPENDENT_AMBULATORY_CARE_PROVIDER_SITE_OTHER): Payer: Medicare Other | Admitting: Internal Medicine

## 2016-03-02 ENCOUNTER — Other Ambulatory Visit (INDEPENDENT_AMBULATORY_CARE_PROVIDER_SITE_OTHER): Payer: Medicare Other

## 2016-03-02 ENCOUNTER — Encounter: Payer: Self-pay | Admitting: Internal Medicine

## 2016-03-02 VITALS — BP 158/80 | HR 78 | Ht 63.0 in | Wt 226.2 lb

## 2016-03-02 DIAGNOSIS — M150 Primary generalized (osteo)arthritis: Secondary | ICD-10-CM | POA: Diagnosis not present

## 2016-03-02 DIAGNOSIS — C50911 Malignant neoplasm of unspecified site of right female breast: Secondary | ICD-10-CM

## 2016-03-02 DIAGNOSIS — M7989 Other specified soft tissue disorders: Secondary | ICD-10-CM

## 2016-03-02 DIAGNOSIS — F418 Other specified anxiety disorders: Secondary | ICD-10-CM

## 2016-03-02 DIAGNOSIS — I10 Essential (primary) hypertension: Secondary | ICD-10-CM

## 2016-03-02 DIAGNOSIS — C7951 Secondary malignant neoplasm of bone: Secondary | ICD-10-CM

## 2016-03-02 DIAGNOSIS — M159 Polyosteoarthritis, unspecified: Secondary | ICD-10-CM

## 2016-03-02 LAB — HEPATIC FUNCTION PANEL
ALK PHOS: 88 U/L (ref 39–117)
ALT: 8 U/L (ref 0–35)
AST: 12 U/L (ref 0–37)
Albumin: 4 g/dL (ref 3.5–5.2)
BILIRUBIN DIRECT: 0.1 mg/dL (ref 0.0–0.3)
BILIRUBIN TOTAL: 0.4 mg/dL (ref 0.2–1.2)
Total Protein: 8 g/dL (ref 6.0–8.3)

## 2016-03-02 LAB — BASIC METABOLIC PANEL
BUN: 14 mg/dL (ref 6–23)
CALCIUM: 9.9 mg/dL (ref 8.4–10.5)
CO2: 27 meq/L (ref 19–32)
Chloride: 105 mEq/L (ref 96–112)
Creatinine, Ser: 1.06 mg/dL (ref 0.40–1.20)
GFR: 65.33 mL/min (ref >60.00)
Glucose, Bld: 99 mg/dL (ref 70–99)
Potassium: 4.5 mEq/L (ref 3.5–5.1)
SODIUM: 141 meq/L (ref 135–145)

## 2016-03-02 LAB — D-DIMER, QUANTITATIVE: D-Dimer, Quant: 1.39 ug/mL-FEU — ABNORMAL HIGH (ref 0.00–0.48)

## 2016-03-02 MED ORDER — RIVAROXABAN (XARELTO) VTE STARTER PACK (15 & 20 MG): ORAL_TABLET | ORAL | Status: DC

## 2016-03-02 MED ORDER — LOSARTAN POTASSIUM-HCTZ 100-25 MG PO TABS: 1.0000 | ORAL_TABLET | Freq: Every day | ORAL | Status: DC

## 2016-03-02 MED ORDER — ACETAMINOPHEN-CODEINE #2 300-15 MG PO TABS: 1.0000 | ORAL_TABLET | Freq: Four times a day (QID) | ORAL | Status: DC | PRN

## 2016-03-02 MED ORDER — AMLODIPINE BESYLATE 10 MG PO TABS: 10.0000 mg | ORAL_TABLET | Freq: Every day | ORAL | Status: DC

## 2016-03-02 MED ORDER — ALPRAZOLAM 0.5 MG PO TABS: 0.5000 mg | ORAL_TABLET | Freq: Two times a day (BID) | ORAL | Status: DC | PRN

## 2016-03-02 NOTE — Assessment & Plan Note (Addendum)
Xanax prn: not to take w/T#2  Potential benefits of a long term benzodiazepines  use as well as potential risks  and complications were explained to the patient and were aknowledged.

## 2016-03-02 NOTE — Assessment & Plan Note (Addendum)
5/17 LLE x3 wks - r/o DVT D dimer Doppler US Xarelto to start if (+) d dimer Do not take Arimidex for a few days (Anastrazole) until we get the Ultrasound results

## 2016-03-02 NOTE — Assessment & Plan Note (Signed)
Wt Readings from Last 3 Encounters:  03/02/16 226 lb 4 oz (102.626 kg)  01/05/16 221 lb 8 oz (100.472 kg)  10/28/15 205 lb 3 oz (93.072 kg)   BP Readings from Last 3 Encounters:  03/02/16 158/80  01/05/16 152/61  10/30/15 128/44

## 2016-03-02 NOTE — Progress Notes (Signed)
Pre visit review using our clinic review tool, if applicable. No additional management support is needed unless otherwise documented below in the visit note. 

## 2016-03-02 NOTE — Progress Notes (Signed)
Subjective:  Patient ID: [name] [name], female    DOB: [birth year]  Age: 75 y.o. MRN: [id number]  CC: Leg Swelling   HPI [name] [name] presents for HTN, OA, LBP  Outpatient Prescriptions Prior to Visit  Medication Sig Dispense Refill  . acetaminophen-codeine (TYLENOL #2) 300-15 MG tablet TAKE 1-2 TABLETS BY MOUTH EVERY 4 HOURS AS NEEDED FOR SEVERE PAIN  1  . amLODipine (NORVASC) 10 MG tablet Take 1 tablet (10 mg total) by mouth daily. 90 tablet 2  . anastrozole (ARIMIDEX) 1 MG tablet Take 1 tablet (1 mg total) by mouth daily. 90 tablet 2  . Cholecalciferol (VITAMIN D3) 1000 UNITS CAPS Take 1 capsule by mouth daily.     Marland Kitchen docusate sodium (COLACE) 100 MG capsule Take 1 capsule (100 mg total) by mouth 2 (two) times daily. 10 capsule 0  . ferrous sulfate 325 (65 FE) MG tablet Take 1 tablet (325 mg total) by mouth 3 (three) times daily after meals.  3  . HYDROcodone-acetaminophen (NORCO) 7.5-325 MG tablet Take 1-2 tablets by mouth every 4 (four) hours as needed for moderate pain. 100 tablet 0  . losartan-hydrochlorothiazide (HYZAAR) 100-25 MG per tablet Take 1 tablet by mouth daily. 90 tablet 3  . meclizine (ANTIVERT) 12.5 MG tablet Take 1 tablet (12.5 mg total) by mouth 2 (two) times daily as needed for dizziness. 10 tablet 0  . polyethylene glycol (MIRALAX / GLYCOLAX) packet Take 17 g by mouth 2 (two) times daily. 14 each 0  . promethazine (PHENERGAN) 12.5 MG tablet Take 1 tablet (12.5 mg total) by mouth every 6 (six) hours as needed for nausea or vomiting. 30 tablet 0  . tiZANidine (ZANAFLEX) 4 MG tablet Take 1 tablet (4 mg total) by mouth every 6 (six) hours as needed for muscle spasms. 40 tablet 0   No facility-administered medications prior to visit.    ROS Review of Systems  Constitutional: Negative for chills, activity change, appetite change, fatigue and unexpected weight change.  HENT: Negative for congestion, mouth sores and sinus pressure.   Eyes: Negative for visual  disturbance.  Respiratory: Negative for cough and chest tightness.   Cardiovascular: Positive for leg swelling. Negative for chest pain.  Gastrointestinal: Negative for nausea, vomiting and abdominal pain.  Genitourinary: Negative for frequency, difficulty urinating and vaginal pain.  Musculoskeletal: Positive for back pain and arthralgias. Negative for gait problem.  Skin: Negative for pallor and rash.  Neurological: Negative for dizziness, tremors, weakness, numbness and headaches.  Psychiatric/Behavioral: Negative for suicidal ideas, confusion and sleep disturbance. The patient is not nervous/anxious.     Objective:  BP 158/80 mmHg  Pulse 78  Ht 5\' 3"  (1.6 m)  Wt 226 lb 4 oz (102.626 kg)  BMI 40.09 kg/m2  SpO2 94%  BP Readings from Last 3 Encounters:  03/02/16 158/80  01/05/16 152/61  10/30/15 128/44    Wt Readings from Last 3 Encounters:  03/02/16 226 lb 4 oz (102.626 kg)  01/05/16 221 lb 8 oz (100.472 kg)  10/28/15 205 lb 3 oz (93.072 kg)    Physical Exam  Constitutional: She appears well-developed. No distress.  HENT:  Head: Normocephalic.  Right Ear: External ear normal.  Left Ear: External ear normal.  Nose: Nose normal.  Mouth/Throat: Oropharynx is clear and moist.  Eyes: Conjunctivae are normal. Pupils are equal, round, and reactive to light. Right eye exhibits no discharge. Left eye exhibits no discharge.  Neck: Normal range of motion. Neck supple. No JVD present. No  tracheal deviation present. No thyromegaly present.  Cardiovascular: Normal rate, regular rhythm and normal heart sounds.   Pulmonary/Chest: No stridor. No respiratory distress. She has no wheezes.  Abdominal: Soft. Bowel sounds are normal. She exhibits no distension and no mass. There is no tenderness. There is no rebound and no guarding.  Musculoskeletal: She exhibits edema. She exhibits no tenderness.  Lymphadenopathy:    She has no cervical adenopathy.  Neurological: She displays normal  reflexes. No cranial nerve deficit. She exhibits normal muscle tone. Coordination normal.  Skin: No rash noted. No erythema.  Psychiatric: She has a normal mood and affect. Her behavior is normal. Judgment and thought content normal.  L dist leg w/swelling 1+, sensitive a little Cane Limp    Ct Chest W Contrast  12/29/2015  CLINICAL DATA:  Right breast cancer with right lumpectomy. EXAM: CT CHEST, ABDOMEN, AND PELVIS WITH CONTRAST TECHNIQUE: Multidetector CT imaging of the chest, abdomen and pelvis was performed following the standard protocol during bolus administration of intravenous contrast. CONTRAST:  43mL OMNIPAQUE IOHEXOL 300 MG/ML  SOLN COMPARISON:  From 09/04/2014. FINDINGS: CT CHEST FINDINGS Mediastinum/Lymph Nodes: There is no axillary lymphadenopathy. Surgical clips again noted in the right axilla with associated scarring. No mediastinal lymphadenopathy. 11 mm right infrahilar lymph node (image 32 series 2 was only 5 mm previously. No left hilar lymphadenopathy. The heart size is normal. No pericardial effusion. Coronary artery calcification is noted. The esophagus has normal imaging features. Stable 9 mm left thyroid nodule left Port-A-Cath remains in place with tip positioned at the distal SVC level. Probable retained cuff from previous tunneled central line in the left anterior chest wall. Lungs/Pleura: Probable anterior subpleural radiation fibrosis right lung. The basilar chronic atelectasis or scarring in the lower lobes is stable. No suspicious pulmonary nodule or mass. No focal airspace consolidation. No pleural effusion. Musculoskeletal: Bone windows reveal no worrisome lytic or sclerotic osseous lesions. CT ABDOMEN PELVIS FINDINGS Hepatobiliary: No focal abnormality within the liver parenchyma. Small calcified stones are seen in the gallbladder. No intrahepatic or extrahepatic biliary dilation. Pancreas: No focal mass lesion. No dilatation of the main duct. No intraparenchymal cyst.  No peripancreatic edema. Spleen: No splenomegaly. No focal mass lesion. Adrenals/Urinary Tract: No adrenal nodule or mass. Scattered areas of mild cortical scarring are noted in the kidneys bilaterally. No enhancing renal mass lesion. No hydronephrosis. No evidence for hydroureter. The distal ureters and posterior bladder are obscured from streak artifact due to bilateral hip replacement. Stomach/Bowel: Small hiatal hernia noted. Stomach otherwise unremarkable. Duodenum is normally positioned as is the ligament of Treitz. No small bowel wall thickening. No small bowel dilatation. Portions of the pelvic small bowel are obscured by streak artifact. The terminal ileum is normal. The appendix is normal. No gross colonic mass. No colonic wall thickening. No substantial diverticular change. Vascular/Lymphatic: There is abdominal aortic atherosclerosis without aneurysm. There is no gastrohepatic or hepatoduodenal ligament lymphadenopathy. No intraperitoneal or retroperitoneal lymphadenopathy. No pelvic sidewall lymphadenopathy. Reproductive: Uterus is surgically absent. There is no adnexal mass. Other: No intraperitoneal free fluid. Musculoskeletal: Patient is status post bilateral hip replacement Bone windows reveal no worrisome lytic or sclerotic osseous lesions. IMPRESSION: 1. Slight interval increase in a borderline enlarged right infrahilar lymph node. While this is indeterminate, close follow-up is recommended as metastatic disease cannot be entirely excluded. 2. Otherwise stable exam. 3. Coronary artery atherosclerosis. 4. Cholelithiasis 5. Interval right hip replacement with bilateral hip replacement no evident. Electronically Signed   By: Verda Cumins.D.  On: 12/29/2015 13:02   Nm Bone Scan Whole Body  12/29/2015  CLINICAL DATA:  Breast cancer. EXAM: NUCLEAR MEDICINE WHOLE BODY BONE SCAN TECHNIQUE: Whole body anterior and posterior images were obtained approximately 3 hours after intravenous injection of  radiopharmaceutical. RADIOPHARMACEUTICALS:  25.5 mCi Technetium-65m MDP IV COMPARISON:  CT 12/29/2015, PET-CT 09/04/2014. FINDINGS: Bilateral renal function and excretion. Multi focal areas of increased activity noted the lumbar spine and thoracic spine consistent with degenerative change again noted. Bilateral hip replacements. Focal area of increased activity noted about the right acetabulum, this could be secondary to the hip replacement. Loosening cannot be completely excluded. Focal bony lesion cannot be excluded. Left hip series suggested for further evaluation. IMPRESSION: 1. Stable thoracolumbar spine activity most likely degenerative. 2. Focal area of increased activity noted over the left acetabulum. This may be secondary to right hip replacement. Loosening of the acetabular cup cannot be excluded. To exclude a focal lesion right hip series suggested . Electronically Signed   By: Marcello Moores  Register   On: 12/29/2015 13:21   Ct Abdomen Pelvis W Contrast  12/29/2015  CLINICAL DATA:  Right breast cancer with right lumpectomy. EXAM: CT CHEST, ABDOMEN, AND PELVIS WITH CONTRAST TECHNIQUE: Multidetector CT imaging of the chest, abdomen and pelvis was performed following the standard protocol during bolus administration of intravenous contrast. CONTRAST:  51mL OMNIPAQUE IOHEXOL 300 MG/ML  SOLN COMPARISON:  From 09/04/2014. FINDINGS: CT CHEST FINDINGS Mediastinum/Lymph Nodes: There is no axillary lymphadenopathy. Surgical clips again noted in the right axilla with associated scarring. No mediastinal lymphadenopathy. 11 mm right infrahilar lymph node (image 32 series 2 was only 5 mm previously. No left hilar lymphadenopathy. The heart size is normal. No pericardial effusion. Coronary artery calcification is noted. The esophagus has normal imaging features. Stable 9 mm left thyroid nodule left Port-A-Cath remains in place with tip positioned at the distal SVC level. Probable retained cuff from previous tunneled  central line in the left anterior chest wall. Lungs/Pleura: Probable anterior subpleural radiation fibrosis right lung. The basilar chronic atelectasis or scarring in the lower lobes is stable. No suspicious pulmonary nodule or mass. No focal airspace consolidation. No pleural effusion. Musculoskeletal: Bone windows reveal no worrisome lytic or sclerotic osseous lesions. CT ABDOMEN PELVIS FINDINGS Hepatobiliary: No focal abnormality within the liver parenchyma. Small calcified stones are seen in the gallbladder. No intrahepatic or extrahepatic biliary dilation. Pancreas: No focal mass lesion. No dilatation of the main duct. No intraparenchymal cyst. No peripancreatic edema. Spleen: No splenomegaly. No focal mass lesion. Adrenals/Urinary Tract: No adrenal nodule or mass. Scattered areas of mild cortical scarring are noted in the kidneys bilaterally. No enhancing renal mass lesion. No hydronephrosis. No evidence for hydroureter. The distal ureters and posterior bladder are obscured from streak artifact due to bilateral hip replacement. Stomach/Bowel: Small hiatal hernia noted. Stomach otherwise unremarkable. Duodenum is normally positioned as is the ligament of Treitz. No small bowel wall thickening. No small bowel dilatation. Portions of the pelvic small bowel are obscured by streak artifact. The terminal ileum is normal. The appendix is normal. No gross colonic mass. No colonic wall thickening. No substantial diverticular change. Vascular/Lymphatic: There is abdominal aortic atherosclerosis without aneurysm. There is no gastrohepatic or hepatoduodenal ligament lymphadenopathy. No intraperitoneal or retroperitoneal lymphadenopathy. No pelvic sidewall lymphadenopathy. Reproductive: Uterus is surgically absent. There is no adnexal mass. Other: No intraperitoneal free fluid. Musculoskeletal: Patient is status post bilateral hip replacement Bone windows reveal no worrisome lytic or sclerotic osseous lesions. IMPRESSION:  1. Slight interval  increase in a borderline enlarged right infrahilar lymph node. While this is indeterminate, close follow-up is recommended as metastatic disease cannot be entirely excluded. 2. Otherwise stable exam. 3. Coronary artery atherosclerosis. 4. Cholelithiasis 5. Interval right hip replacement with bilateral hip replacement no evident. Electronically Signed   By: Misty Stanley M.D.   On: 12/29/2015 13:02    Assessment & Plan:   Jyotsna was seen today for leg swelling.  Diagnoses and all orders for this visit:  Leg swelling  Essential hypertension   I am having Ms. Brase maintain her Vitamin D3, meclizine, losartan-hydrochlorothiazide, anastrozole, docusate sodium, ferrous sulfate, HYDROcodone-acetaminophen, polyethylene glycol, tiZANidine, promethazine, amLODipine, and acetaminophen-codeine.  No orders of the defined types were placed in this encounter.     Follow-up: No Follow-up on file.  Walker Kehr, MD

## 2016-03-02 NOTE — Assessment & Plan Note (Signed)
S/p B THR T#2 prn  Potential benefits of a long term opioids use as well as potential risks (i.e. addiction risk, apnea etc) and complications (i.e. Somnolence, constipation and others) were explained to the patient and were aknowledged.

## 2016-03-02 NOTE — Patient Instructions (Signed)
Do not take Arimidex for a few days (Anastrazole) until we get the Ultrasound results

## 2016-03-04 NOTE — Addendum Note (Signed)
Addended by: Cassandria Anger on: 03/04/2016 01:42 PM   Modules accepted: Level of Service

## 2016-03-24 ENCOUNTER — Telehealth: Payer: Self-pay | Admitting: *Deleted

## 2016-03-24 DIAGNOSIS — C7951 Secondary malignant neoplasm of bone: Principal | ICD-10-CM

## 2016-03-24 DIAGNOSIS — C50911 Malignant neoplasm of unspecified site of right female breast: Secondary | ICD-10-CM

## 2016-03-24 MED ORDER — ACETAMINOPHEN-CODEINE #2 300-15 MG PO TABS: 1.0000 | ORAL_TABLET | Freq: Four times a day (QID) | ORAL | Status: DC | PRN

## 2016-03-24 NOTE — Telephone Encounter (Signed)
Receive fax pt requesting refill for Tylenol #2 take 1 by mouth every 6 hours prn...[name] [name]

## 2016-03-24 NOTE — Telephone Encounter (Signed)
OK to fill this prescription with additional refills x3 Thank you!  

## 2016-03-25 NOTE — Telephone Encounter (Signed)
Called refill into [facility] spoke w/Crystal gave md authorization...Johny Chess

## 2016-04-05 ENCOUNTER — Ambulatory Visit: Payer: Medicare Other

## 2016-04-05 ENCOUNTER — Telehealth: Payer: Self-pay | Admitting: Internal Medicine

## 2016-04-05 ENCOUNTER — Encounter: Payer: Self-pay | Admitting: Internal Medicine

## 2016-04-05 ENCOUNTER — Ambulatory Visit (HOSPITAL_BASED_OUTPATIENT_CLINIC_OR_DEPARTMENT_OTHER): Payer: Medicare Other | Admitting: Internal Medicine

## 2016-04-05 ENCOUNTER — Ambulatory Visit (HOSPITAL_BASED_OUTPATIENT_CLINIC_OR_DEPARTMENT_OTHER): Payer: Medicare Other

## 2016-04-05 ENCOUNTER — Other Ambulatory Visit (HOSPITAL_BASED_OUTPATIENT_CLINIC_OR_DEPARTMENT_OTHER): Payer: Medicare Other

## 2016-04-05 VITALS — BP 149/64 | HR 72 | Temp 98.1°F | Resp 17 | Ht 63.0 in | Wt 228.9 lb

## 2016-04-05 DIAGNOSIS — C50911 Malignant neoplasm of unspecified site of right female breast: Secondary | ICD-10-CM | POA: Diagnosis not present

## 2016-04-05 DIAGNOSIS — C7951 Secondary malignant neoplasm of bone: Secondary | ICD-10-CM | POA: Diagnosis not present

## 2016-04-05 DIAGNOSIS — R0609 Other forms of dyspnea: Secondary | ICD-10-CM

## 2016-04-05 DIAGNOSIS — Z95828 Presence of other vascular implants and grafts: Secondary | ICD-10-CM

## 2016-04-05 LAB — COMPREHENSIVE METABOLIC PANEL
ALT: <9 U/L (ref 0–55)
AST: 14 U/L (ref 5–34)
Albumin: 3.4 g/dL — ABNORMAL LOW (ref 3.5–5.0)
Alkaline Phosphatase: 75 U/L (ref 40–150)
Anion Gap: 9 mEq/L (ref 3–11)
BUN: 13.9 mg/dL (ref 7.0–26.0)
CHLORIDE: 109 meq/L (ref 98–109)
CO2: 24 mEq/L (ref 22–29)
Calcium: 8.8 mg/dL (ref 8.4–10.4)
Creatinine: 1 mg/dL (ref 0.6–1.1)
EGFR: 62 mL/min/{1.73_m2} — AB (ref >90)
GLUCOSE: 88 mg/dL (ref 70–140)
POTASSIUM: 3.8 meq/L (ref 3.5–5.1)
SODIUM: 142 meq/L (ref 136–145)
Total Bilirubin: <0.3 mg/dL (ref 0.20–1.20)
Total Protein: 7.6 g/dL (ref 6.4–8.3)

## 2016-04-05 LAB — CBC WITH DIFFERENTIAL/PLATELET
BASO%: 0.4 % (ref 0.0–2.0)
BASOS ABS: 0 10*3/uL (ref 0.0–0.1)
EOS%: 4.5 % (ref 0.0–7.0)
Eosinophils Absolute: 0.2 10*3/uL (ref 0.0–0.5)
HCT: 34.1 % — ABNORMAL LOW (ref 34.8–46.6)
HGB: 11.1 g/dL — ABNORMAL LOW (ref 11.6–15.9)
LYMPH%: 24.7 % (ref 14.0–49.7)
MCH: 28.5 pg (ref 25.1–34.0)
MCHC: 32.6 g/dL (ref 31.5–36.0)
MCV: 87.7 fL (ref 79.5–101.0)
MONO#: 0.6 10*3/uL (ref 0.1–0.9)
MONO%: 11.8 % (ref 0.0–14.0)
NEUT#: 3 10*3/uL (ref 1.5–6.5)
NEUT%: 58.6 % (ref 38.4–76.8)
Platelets: 241 10*3/uL (ref 145–400)
RBC: 3.89 10*6/uL (ref 3.70–5.45)
RDW: 14.5 % (ref 11.2–14.5)
WBC: 5.1 10*3/uL (ref 3.9–10.3)
lymph#: 1.3 10*3/uL (ref 0.9–3.3)

## 2016-04-05 MED ORDER — ANASTROZOLE 1 MG PO TABS: 1.0000 mg | ORAL_TABLET | Freq: Every day | ORAL | Status: DC

## 2016-04-05 MED ORDER — DENOSUMAB 120 MG/1.7ML ~~LOC~~ SOLN
120.0000 mg | Freq: Once | SUBCUTANEOUS | Status: AC
  Administered 2016-04-05: 120 mg via SUBCUTANEOUS
  Filled 2016-04-05: qty 1.7

## 2016-04-05 MED ORDER — HEPARIN SOD (PORK) LOCK FLUSH 100 UNIT/ML IV SOLN
500.0000 [IU] | Freq: Once | INTRAVENOUS | Status: AC
  Administered 2016-04-05: 500 [IU] via INTRAVENOUS
  Filled 2016-04-05: qty 5

## 2016-04-05 MED ORDER — SODIUM CHLORIDE 0.9% FLUSH
10.0000 mL | INTRAVENOUS | Status: DC | PRN
  Administered 2016-04-05: 10 mL via INTRAVENOUS
  Filled 2016-04-05: qty 10

## 2016-04-05 NOTE — Addendum Note (Signed)
Addended by: Ardeen Garland on: 04/05/2016 01:12 PM   Modules accepted: Medications

## 2016-04-05 NOTE — Progress Notes (Signed)
[facility] [phone]   [phone]  OFFICE PROGRESS NOTE  Pcp Not In System No address on file  DIAGNOSIS AND STAGE: Metastatic breast adenocarcinoma, presenting with bone metastasis in the left hip area. This was initially diagnosed as stage II (T2, N1, M0) invasive right breast carcinoma with positive estrogen and progesterone receptors and HER-2/neu positive in July 2002   PRIOR THERAPY:  1. Status post right lumpectomy with right axillary lymph node dissection revealing 4/9 lymph nodes that were positive for malignancy.  2. Status post 8 cycles of systemic chemotherapy with CMF, completed March 2003.  3. Status post radiotherapy to the remaining right breast under the care of Dr. Tammi Klippel.  4. Status post 2 years of treatment with tamoxifen, started July 2003, then switched to Femara by Dr. Humphrey Rolls in August 2005. The patient discontinued the treatment in February 2009.  5. Status post left total hip replacement on Apr 01, 2011, secondary to pathologic left femoral neck fracture. 6. Zometa 4 mg IV on 3 monthly basis. Discontinued secondary to renal insufficiency.   CURRENT THERAPY:  1. Arimidex 1 mg p.o. daily, started 03/18/2011.  2. Xgeva 120 mcg subcutaneously on 3 monthly basis.  CHEMOTHERAPY INTENT: Palliative  CURRENT # OF HORMONAL THERAPY CYCLES: 65 CURRENT ANTIEMETICS: None  CURRENT SMOKING STATUS: Nonsmoker  ORAL HORMONAL THERAPY AND CONSENT: Yes.  CURRENT BISPHOSPHONATES USE: Zometa with stable renal function.  PAIN MANAGEMENT: Tramadol when necessary  NARCOTICS INDUCED CONSTIPATION: None  LIVING WILL AND CODE STATUS: Full code.   INTERVAL HISTORY:  [name] [name] 75 y.o. female returns to the clinic today for followup visit. The patient is feeling fine today was no specific complaints. She was recently started on treatment to [place] by her primary care physician for questionable deep venous thrombosis, but review of her  record I did not see any evidence of deep venous thrombosis unless it is on a Therapist, art. She was also advised by Dr. Alain Marion to stop taking Arimidex for unclear reason for me. The patient is doing well today. She has no significant weight loss or night sweats. She has no nausea or vomiting. The patient denied having any significant chest pain, but has shortness of breath with exertion with no cough or hemoptysis. She had repeat CBC, comprehensive metabolic panel and CA 82.50 performed earlier today and she is here for evaluation and discussion of her lab results.  MEDICAL HISTORY: Past Medical History  Diagnosis Date  . Asthma   . Hypertension   . Cerebrovascular accident [facility])     R thalamic CVA 01/2010  . Hyperlipemia   . Dyslipidemia   . TIA (transient ischemic attack)     x2 in 2011  . Low back pain   . Osteoarthritis   . Vertigo 2014  . Anxiety     hx of  . Breast cancer ([place])     2002; metastatic in 2012, right breast, spread to left hip in 2012    ALLERGIES:  has No Known Allergies.  MEDICATIONS:  Current Outpatient Prescriptions  Medication Sig Dispense Refill  . acetaminophen-codeine (TYLENOL #2) 300-15 MG tablet Take 1 tablet by mouth every 6 (six) hours as needed for severe pain. 90 tablet 3  . ALPRAZolam (XANAX) 0.5 MG tablet Take 1 tablet (0.5 mg total) by mouth 2 (two) times daily as needed for anxiety or sleep. 60 tablet 2  . amLODipine (NORVASC) 10 MG tablet Take 1 tablet (  10 mg total) by mouth daily. 90 tablet 3  . Cholecalciferol (VITAMIN D3) 1000 UNITS CAPS Take 1 capsule by mouth daily.     Marland Kitchen docusate sodium (COLACE) 100 MG capsule Take 1 capsule (100 mg total) by mouth 2 (two) times daily. 10 capsule 0  . losartan-hydrochlorothiazide (HYZAAR) 100-25 MG tablet Take 1 tablet by mouth daily. 90 tablet 3  . Rivaroxaban 15 & 20 MG TBPK Take as directed on package: Start with one 81m tablet by mouth twice a day with food. On Day 22,  switch to one 265mtablet once a day with food. 51 each 0  . anastrozole (ARIMIDEX) 1 MG tablet Take 1 tablet (1 mg total) by mouth daily. (Patient not taking: Reported on 04/05/2016) 90 tablet 2  . meclizine (ANTIVERT) 12.5 MG tablet Take 1 tablet (12.5 mg total) by mouth 2 (two) times daily as needed for dizziness. (Patient not taking: Reported on 04/05/2016) 10 tablet 0  . polyethylene glycol (MIRALAX / GLYCOLAX) packet Take 17 g by mouth 2 (two) times daily. (Patient not taking: Reported on 04/05/2016) 14 each 0  . promethazine (PHENERGAN) 12.5 MG tablet Take 1 tablet (12.5 mg total) by mouth every 6 (six) hours as needed for nausea or vomiting. (Patient not taking: Reported on 04/05/2016) 30 tablet 0  . tiZANidine (ZANAFLEX) 4 MG tablet Take 1 tablet (4 mg total) by mouth every 6 (six) hours as needed for muscle spasms. (Patient not taking: Reported on 04/05/2016) 40 tablet 0   No current facility-administered medications for this visit.    SURGICAL HISTORY:  Past Surgical History  Procedure Laterality Date  . Abdominal hysterectomy      complete  . Breast lumpectomy      Right breast 2002  . Joint replacement  2012    L hip due to breast ca met  . Total hip arthroplasty Right 10/28/2015    Procedure: RIGHT TOTAL HIP ARTHROPLASTY ANTERIOR APPROACH;  Surgeon: MaParalee CancelMD;  Location: [facility];  Service: Orthopedics;  Laterality: Right;    REVIEW OF SYSTEMS:  A comprehensive review of systems was negative except for: Constitutional: positive for fatigue Musculoskeletal: positive for arthralgias   PHYSICAL EXAMINATION: General appearance: alert, cooperative, fatigued and no distress Head: Normocephalic, without obvious abnormality, atraumatic Neck: no adenopathy, no JVD, supple, symmetrical, trachea midline and thyroid not enlarged, symmetric, no tenderness/mass/nodules Lymph nodes: Cervical, supraclavicular, and axillary nodes normal. Resp: clear to auscultation bilaterally Back: symmetric,  no curvature. ROM normal. No CVA tenderness. Cardio: regular rate and rhythm, S1, S2 normal, no murmur, click, rub or gallop GI: soft, non-tender; bowel sounds normal; no masses,  no organomegaly Extremities: extremities normal, atraumatic, no cyanosis or edema Neurologic: Alert and oriented X 3, normal strength and tone. Normal symmetric reflexes. Normal coordination and gait  ECOG PERFORMANCE STATUS: 1 - Symptomatic but completely ambulatory  Blood pressure 149/64, pulse 72, temperature 98.1 F (36.7 C), temperature source Oral, resp. rate 17, height _0  (1.6 m), weight 228 lb 14.4 oz (103.828 kg), SpO2 100 %.  LABORATORY DATA: Lab Results  Component Value Date   WBC 5.1 04/05/2016   HGB 11.1* 04/05/2016   HCT 34.1* 04/05/2016   MCV 87.7 04/05/2016   PLT 241 04/05/2016      Chemistry      Component Value Date/Time   NA 141 03/02/2016 0834   NA 141 12/29/2015 0820   NA 140 05/03/2012 1449   K 4.5 03/02/2016 0834   K 3.9 12/29/2015 0820  K 4.3 05/03/2012 1449   CL 105 03/02/2016 0834   CL 109* 04/25/2013 1023   CL 99 05/03/2012 1449   CO2 27 03/02/2016 0834   CO2 24 12/29/2015 0820   CO2 27 05/03/2012 1449   BUN 14 03/02/2016 0834   BUN 22.3 12/29/2015 0820   BUN 14 05/03/2012 1449   CREATININE 1.06 03/02/2016 0834   CREATININE 1.6* 12/29/2015 0820   CREATININE 1.1 05/03/2012 1449      Component Value Date/Time   CALCIUM 9.9 03/02/2016 0834   CALCIUM 10.1 12/29/2015 0820   CALCIUM 9.1 05/03/2012 1449   ALKPHOS 88 03/02/2016 0834   ALKPHOS 92 12/29/2015 0820   ALKPHOS 78 05/03/2012 1449   AST 12 03/02/2016 0834   AST 16 12/29/2015 0820   AST 19 05/03/2012 1449   ALT 8 03/02/2016 0834   ALT 9 12/29/2015 0820   ALT 14 05/03/2012 1449   BILITOT 0.4 03/02/2016 0834   BILITOT 0.33 12/29/2015 0820   BILITOT 0.50 05/03/2012 1449      RADIOGRAPHIC STUDIES: No results found. ASSESSMENT AND PLAN: This is a very pleasant 75 years old African American female with  metastatic breast adenocarcinoma with bone metastasis. The patient is currently on hormonal therapy with Arimidex with no significant evidence for disease progression.  I recommended for the patient to resume her treatment with Arimidex. The patient will also continue her current treatment with Xgeva 120 mcg subcutaneously every 3 months. She will receive a dose today. She will come back for follow-up visit in 3 months with repeat blood work. She will have Port-A-Cath flush every 6 weeks. She was advised to call immediately if she has any concerning symptoms in the interval. The patient voices understanding of current disease status and treatment options and is in agreement with the current care plan.  All questions were answered. The patient knows to call the clinic with any problems, questions or concerns. We can certainly see the patient much sooner if necessary.  Disclaimer: This note was dictated with voice recognition software. Similar sounding words can inadvertently be transcribed and may not be corrected upon review.

## 2016-04-05 NOTE — Telephone Encounter (Signed)
Gave and printed appt sched and avs for pt for Sept °

## 2016-04-05 NOTE — Patient Instructions (Signed)
Denosumab injection What is this medicine? DENOSUMAB (den oh sue mab) slows bone breakdown. Prolia is used to treat osteoporosis in women after menopause and in men. Xgeva is used to prevent bone fractures and other bone problems caused by cancer bone metastases. Xgeva is also used to treat giant cell tumor of the bone. This medicine may be used for other purposes; ask your health care provider or pharmacist if you have questions. COMMON BRAND NAME(S): Prolia, XGEVA What should I tell my health care provider before I take this medicine? They need to know if you have any of these conditions: -dental disease -eczema -infection or history of infections -kidney disease or on dialysis -low blood calcium or vitamin D -malabsorption syndrome -scheduled to have surgery or tooth extraction -taking medicine that contains denosumab -thyroid or parathyroid disease -an unusual reaction to denosumab, other medicines, foods, dyes, or preservatives -pregnant or trying to get pregnant -breast-feeding How should I use this medicine? This medicine is for injection under the skin. It is given by a health care professional in a hospital or clinic setting. If you are getting Prolia, a special MedGuide will be given to you by the pharmacist with each prescription and refill. Be sure to read this information carefully each time. For Prolia, talk to your pediatrician regarding the use of this medicine in children. Special care may be needed. For Xgeva, talk to your pediatrician regarding the use of this medicine in children. While this drug may be prescribed for children as young as 13 years for selected conditions, precautions do apply. Overdosage: If you think you've taken too much of this medicine contact a poison control center or emergency room at once. Overdosage: If you think you have taken too much of this medicine contact a poison control center or emergency room at once. NOTE: This medicine is only for  you. Do not share this medicine with others. What if I miss a dose? It is important not to miss your dose. Call your doctor or health care professional if you are unable to keep an appointment. What may interact with this medicine? Do not take this medicine with any of the following medications: -other medicines containing denosumab This medicine may also interact with the following medications: -medicines that suppress the immune system -medicines that treat cancer -steroid medicines like prednisone or cortisone This list may not describe all possible interactions. Give your health care provider a list of all the medicines, herbs, non-prescription drugs, or dietary supplements you use. Also tell them if you smoke, drink alcohol, or use illegal drugs. Some items may interact with your medicine. What should I watch for while using this medicine? Visit your doctor or health care professional for regular checks on your progress. Your doctor or health care professional may order blood tests and other tests to see how you are doing. Call your doctor or health care professional if you get a cold or other infection while receiving this medicine. Do not treat yourself. This medicine may decrease your body's ability to fight infection. You should make sure you get enough calcium and vitamin D while you are taking this medicine, unless your doctor tells you not to. Discuss the foods you eat and the vitamins you take with your health care professional. See your dentist regularly. Brush and floss your teeth as directed. Before you have any dental work done, tell your dentist you are receiving this medicine. Do not become pregnant while taking this medicine or for 5 months after stopping   it. Women should inform their doctor if they wish to become pregnant or think they might be pregnant. There is a potential for serious side effects to an unborn child. Talk to your health care professional or pharmacist for more  information. What side effects may I notice from receiving this medicine? Side effects that you should report to your doctor or health care professional as soon as possible: -allergic reactions like skin rash, itching or hives, swelling of the face, lips, or tongue -breathing problems -chest pain -fast, irregular heartbeat -feeling faint or lightheaded, falls -fever, chills, or any other sign of infection -muscle spasms, tightening, or twitches -numbness or tingling -skin blisters or bumps, or is dry, peels, or red -slow healing or unexplained pain in the mouth or jaw -unusual bleeding or bruising Side effects that usually do not require medical attention (Report these to your doctor or health care professional if they continue or are bothersome.): -muscle pain -stomach upset, gas This list may not describe all possible side effects. Call your doctor for medical advice about side effects. You may report side effects to FDA at 1-800-FDA-1088. Where should I keep my medicine? This medicine is only given in a clinic, doctor's office, or other health care setting and will not be stored at home. NOTE: This sheet is a summary. It may not cover all possible information. If you have questions about this medicine, talk to your doctor, pharmacist, or health care provider.  2015, [employer] Standard. (2012-04-17 12:37:47)  

## 2016-04-06 LAB — CANCER ANTIGEN 27.29: CA 27.29: 63.1 U/mL — ABNORMAL HIGH (ref 0.0–38.6)

## 2016-04-06 LAB — CANCER ANTIGEN 27-29 (PARALLEL TESTING): CA 27.29: 67 U/mL — AB (ref <38)

## 2016-04-13 ENCOUNTER — Encounter: Payer: Self-pay | Admitting: Internal Medicine

## 2016-04-13 ENCOUNTER — Ambulatory Visit (INDEPENDENT_AMBULATORY_CARE_PROVIDER_SITE_OTHER): Payer: Medicare Other | Admitting: Internal Medicine

## 2016-04-13 VITALS — BP 148/90 | HR 86 | Wt 230.0 lb

## 2016-04-13 DIAGNOSIS — R21 Rash and other nonspecific skin eruption: Secondary | ICD-10-CM | POA: Insufficient documentation

## 2016-04-13 DIAGNOSIS — R600 Localized edema: Secondary | ICD-10-CM

## 2016-04-13 DIAGNOSIS — I10 Essential (primary) hypertension: Secondary | ICD-10-CM

## 2016-04-13 DIAGNOSIS — M7989 Other specified soft tissue disorders: Secondary | ICD-10-CM

## 2016-04-13 DIAGNOSIS — R5382 Chronic fatigue, unspecified: Secondary | ICD-10-CM | POA: Diagnosis not present

## 2016-04-13 MED ORDER — RIVAROXABAN 20 MG PO TABS: 20.0000 mg | ORAL_TABLET | Freq: Every day | ORAL | Status: DC

## 2016-04-13 MED ORDER — HALOBETASOL PROPIONATE 0.05 % EX CREA: TOPICAL_CREAM | Freq: Two times a day (BID) | CUTANEOUS | Status: DC

## 2016-04-13 NOTE — Progress Notes (Signed)
Pre visit review using our clinic review tool, if applicable. No additional management support is needed unless otherwise documented below in the visit note. 

## 2016-04-13 NOTE — Assessment & Plan Note (Signed)
Pt was taking Xarelto for elev D-dimer and LLE pain and swelling.  She has not had a doppler test yet. She stopped  Xarelto a week ago...- re-start Xarelto

## 2016-04-13 NOTE — Assessment & Plan Note (Signed)
Chronic Hyzaar, Amlodipine

## 2016-04-13 NOTE — Assessment & Plan Note (Signed)
LLE - contact dermatitis (?) or bug bites D/c Xarelto if rash is worse

## 2016-04-13 NOTE — Progress Notes (Signed)
Subjective:  Patient ID: [name] [name], female    DOB: 16-Jun-1941  Age: 75 y.o. MRN: [id number]  CC: No chief complaint on file.   HPI [name] [name] presents for LLE swelling and new rash on L ankle itchy (few days). C/o fatigue  - taking naps. C/o insomnia. Pt was taking Xarelto for elev D-dimer and LLE pain and swelling.  She never had a doppler test yet. She stopped  Xarelto a week ago...   Outpatient Prescriptions Prior to Visit  Medication Sig Dispense Refill  . acetaminophen-codeine (TYLENOL #2) 300-15 MG tablet Take 1 tablet by mouth every 6 (six) hours as needed for severe pain. 90 tablet 3  . ALPRAZolam (XANAX) 0.5 MG tablet Take 1 tablet (0.5 mg total) by mouth 2 (two) times daily as needed for anxiety or sleep. 60 tablet 2  . amLODipine (NORVASC) 10 MG tablet Take 1 tablet (10 mg total) by mouth daily. 90 tablet 3  . anastrozole (ARIMIDEX) 1 MG tablet Take 1 tablet (1 mg total) by mouth daily. 90 tablet 2  . Cholecalciferol (VITAMIN D3) 1000 UNITS CAPS Take 1 capsule by mouth daily.     Marland Kitchen docusate sodium (COLACE) 100 MG capsule Take 1 capsule (100 mg total) by mouth 2 (two) times daily. 10 capsule 0  . losartan-hydrochlorothiazide (HYZAAR) 100-25 MG tablet Take 1 tablet by mouth daily. 90 tablet 3  . meclizine (ANTIVERT) 12.5 MG tablet Take 1 tablet (12.5 mg total) by mouth 2 (two) times daily as needed for dizziness. 10 tablet 0  . polyethylene glycol (MIRALAX / GLYCOLAX) packet Take 17 g by mouth 2 (two) times daily. 14 each 0  . promethazine (PHENERGAN) 12.5 MG tablet Take 1 tablet (12.5 mg total) by mouth every 6 (six) hours as needed for nausea or vomiting. 30 tablet 0  . Rivaroxaban 15 & 20 MG TBPK Take as directed on package: Start with one 15mg  tablet by mouth twice a day with food. On Day 22, switch to one 20mg  tablet once a day with food. 51 each 0  . tiZANidine (ZANAFLEX) 4 MG tablet Take 1 tablet (4 mg total) by mouth every 6 (six) hours as needed for muscle  spasms. 40 tablet 0   No facility-administered medications prior to visit.    ROS Review of Systems  Constitutional: Positive for fatigue. Negative for chills, activity change, appetite change and unexpected weight change.  HENT: Negative for congestion, mouth sores and sinus pressure.   Eyes: Negative for visual disturbance.  Respiratory: Negative for cough and chest tightness.   Cardiovascular: Positive for leg swelling.  Gastrointestinal: Negative for nausea and abdominal pain.  Genitourinary: Negative for frequency, difficulty urinating and vaginal pain.  Musculoskeletal: Positive for arthralgias and gait problem. Negative for back pain.  Skin: Negative for pallor and rash.  Neurological: Negative for dizziness, tremors, weakness, numbness and headaches.  Psychiatric/Behavioral: Negative for confusion and sleep disturbance.    Objective:  BP 148/90 mmHg  Pulse 86  Wt 230 lb (104.327 kg)  SpO2 95%  BP Readings from Last 3 Encounters:  04/13/16 148/90  04/05/16 149/64  03/02/16 158/80    Wt Readings from Last 3 Encounters:  04/13/16 230 lb (104.327 kg)  04/05/16 228 lb 14.4 oz (103.828 kg)  03/02/16 226 lb 4 oz (102.626 kg)    Physical Exam  Constitutional: She appears well-developed. No distress.  HENT:  Head: Normocephalic.  Right Ear: External ear normal.  Left Ear: External ear normal.  Nose: Nose  normal.  Mouth/Throat: Oropharynx is clear and moist.  Eyes: Conjunctivae are normal. Pupils are equal, round, and reactive to light. Right eye exhibits no discharge. Left eye exhibits no discharge.  Neck: Normal range of motion. Neck supple. No JVD present. No tracheal deviation present. No thyromegaly present.  Cardiovascular: Normal rate, regular rhythm and normal heart sounds.   Pulmonary/Chest: No stridor. No respiratory distress. She has no wheezes.  Abdominal: Soft. Bowel sounds are normal. She exhibits no distension and no mass. There is no tenderness. There  is no rebound and no guarding.  Musculoskeletal: She exhibits edema and tenderness.  Lymphadenopathy:    She has no cervical adenopathy.  Neurological: She displays normal reflexes. No cranial nerve deficit. She exhibits normal muscle tone. Coordination normal.  Skin: No rash noted. No erythema.  Psychiatric: She has a normal mood and affect. Her behavior is normal. Judgment and thought content normal.   Trace edema LLE 6-10 0.5-1.0 cm eryth papules on L ankle cane Ct Chest W Contrast  12/29/2015  CLINICAL DATA:  Right breast cancer with right lumpectomy. EXAM: CT CHEST, ABDOMEN, AND PELVIS WITH CONTRAST TECHNIQUE: Multidetector CT imaging of the chest, abdomen and pelvis was performed following the standard protocol during bolus administration of intravenous contrast. CONTRAST:  70mL OMNIPAQUE IOHEXOL 300 MG/ML  SOLN COMPARISON:  From 09/04/2014. FINDINGS: CT CHEST FINDINGS Mediastinum/Lymph Nodes: There is no axillary lymphadenopathy. Surgical clips again noted in the right axilla with associated scarring. No mediastinal lymphadenopathy. 11 mm right infrahilar lymph node (image 32 series 2 was only 5 mm previously. No left hilar lymphadenopathy. The heart size is normal. No pericardial effusion. Coronary artery calcification is noted. The esophagus has normal imaging features. Stable 9 mm left thyroid nodule left Port-A-Cath remains in place with tip positioned at the distal SVC level. Probable retained cuff from previous tunneled central line in the left anterior chest wall. Lungs/Pleura: Probable anterior subpleural radiation fibrosis right lung. The basilar chronic atelectasis or scarring in the lower lobes is stable. No suspicious pulmonary nodule or mass. No focal airspace consolidation. No pleural effusion. Musculoskeletal: Bone windows reveal no worrisome lytic or sclerotic osseous lesions. CT ABDOMEN PELVIS FINDINGS Hepatobiliary: No focal abnormality within the liver parenchyma. Small  calcified stones are seen in the gallbladder. No intrahepatic or extrahepatic biliary dilation. Pancreas: No focal mass lesion. No dilatation of the main duct. No intraparenchymal cyst. No peripancreatic edema. Spleen: No splenomegaly. No focal mass lesion. Adrenals/Urinary Tract: No adrenal nodule or mass. Scattered areas of mild cortical scarring are noted in the kidneys bilaterally. No enhancing renal mass lesion. No hydronephrosis. No evidence for hydroureter. The distal ureters and posterior bladder are obscured from streak artifact due to bilateral hip replacement. Stomach/Bowel: Small hiatal hernia noted. Stomach otherwise unremarkable. Duodenum is normally positioned as is the ligament of Treitz. No small bowel wall thickening. No small bowel dilatation. Portions of the pelvic small bowel are obscured by streak artifact. The terminal ileum is normal. The appendix is normal. No gross colonic mass. No colonic wall thickening. No substantial diverticular change. Vascular/Lymphatic: There is abdominal aortic atherosclerosis without aneurysm. There is no gastrohepatic or hepatoduodenal ligament lymphadenopathy. No intraperitoneal or retroperitoneal lymphadenopathy. No pelvic sidewall lymphadenopathy. Reproductive: Uterus is surgically absent. There is no adnexal mass. Other: No intraperitoneal free fluid. Musculoskeletal: Patient is status post bilateral hip replacement Bone windows reveal no worrisome lytic or sclerotic osseous lesions. IMPRESSION: 1. Slight interval increase in a borderline enlarged right infrahilar lymph node. While this is indeterminate,  close follow-up is recommended as metastatic disease cannot be entirely excluded. 2. Otherwise stable exam. 3. Coronary artery atherosclerosis. 4. Cholelithiasis 5. Interval right hip replacement with bilateral hip replacement no evident. Electronically Signed   By: Misty Stanley M.D.   On: 12/29/2015 13:02   Nm Bone Scan Whole Body  12/29/2015  CLINICAL  DATA:  Breast cancer. EXAM: NUCLEAR MEDICINE WHOLE BODY BONE SCAN TECHNIQUE: Whole body anterior and posterior images were obtained approximately 3 hours after intravenous injection of radiopharmaceutical. RADIOPHARMACEUTICALS:  25.5 mCi Technetium-26m MDP IV COMPARISON:  CT 12/29/2015, PET-CT 09/04/2014. FINDINGS: Bilateral renal function and excretion. Multi focal areas of increased activity noted the lumbar spine and thoracic spine consistent with degenerative change again noted. Bilateral hip replacements. Focal area of increased activity noted about the right acetabulum, this could be secondary to the hip replacement. Loosening cannot be completely excluded. Focal bony lesion cannot be excluded. Left hip series suggested for further evaluation. IMPRESSION: 1. Stable thoracolumbar spine activity most likely degenerative. 2. Focal area of increased activity noted over the left acetabulum. This may be secondary to right hip replacement. Loosening of the acetabular cup cannot be excluded. To exclude a focal lesion right hip series suggested . Electronically Signed   By: Marcello Moores  Register   On: 12/29/2015 13:21   Ct Abdomen Pelvis W Contrast  12/29/2015  CLINICAL DATA:  Right breast cancer with right lumpectomy. EXAM: CT CHEST, ABDOMEN, AND PELVIS WITH CONTRAST TECHNIQUE: Multidetector CT imaging of the chest, abdomen and pelvis was performed following the standard protocol during bolus administration of intravenous contrast. CONTRAST:  38mL OMNIPAQUE IOHEXOL 300 MG/ML  SOLN COMPARISON:  From 09/04/2014. FINDINGS: CT CHEST FINDINGS Mediastinum/Lymph Nodes: There is no axillary lymphadenopathy. Surgical clips again noted in the right axilla with associated scarring. No mediastinal lymphadenopathy. 11 mm right infrahilar lymph node (image 32 series 2 was only 5 mm previously. No left hilar lymphadenopathy. The heart size is normal. No pericardial effusion. Coronary artery calcification is noted. The esophagus has  normal imaging features. Stable 9 mm left thyroid nodule left Port-A-Cath remains in place with tip positioned at the distal SVC level. Probable retained cuff from previous tunneled central line in the left anterior chest wall. Lungs/Pleura: Probable anterior subpleural radiation fibrosis right lung. The basilar chronic atelectasis or scarring in the lower lobes is stable. No suspicious pulmonary nodule or mass. No focal airspace consolidation. No pleural effusion. Musculoskeletal: Bone windows reveal no worrisome lytic or sclerotic osseous lesions. CT ABDOMEN PELVIS FINDINGS Hepatobiliary: No focal abnormality within the liver parenchyma. Small calcified stones are seen in the gallbladder. No intrahepatic or extrahepatic biliary dilation. Pancreas: No focal mass lesion. No dilatation of the main duct. No intraparenchymal cyst. No peripancreatic edema. Spleen: No splenomegaly. No focal mass lesion. Adrenals/Urinary Tract: No adrenal nodule or mass. Scattered areas of mild cortical scarring are noted in the kidneys bilaterally. No enhancing renal mass lesion. No hydronephrosis. No evidence for hydroureter. The distal ureters and posterior bladder are obscured from streak artifact due to bilateral hip replacement. Stomach/Bowel: Small hiatal hernia noted. Stomach otherwise unremarkable. Duodenum is normally positioned as is the ligament of Treitz. No small bowel wall thickening. No small bowel dilatation. Portions of the pelvic small bowel are obscured by streak artifact. The terminal ileum is normal. The appendix is normal. No gross colonic mass. No colonic wall thickening. No substantial diverticular change. Vascular/Lymphatic: There is abdominal aortic atherosclerosis without aneurysm. There is no gastrohepatic or hepatoduodenal ligament lymphadenopathy. No intraperitoneal or retroperitoneal  lymphadenopathy. No pelvic sidewall lymphadenopathy. Reproductive: Uterus is surgically absent. There is no adnexal mass.  Other: No intraperitoneal free fluid. Musculoskeletal: Patient is status post bilateral hip replacement Bone windows reveal no worrisome lytic or sclerotic osseous lesions. IMPRESSION: 1. Slight interval increase in a borderline enlarged right infrahilar lymph node. While this is indeterminate, close follow-up is recommended as metastatic disease cannot be entirely excluded. 2. Otherwise stable exam. 3. Coronary artery atherosclerosis. 4. Cholelithiasis 5. Interval right hip replacement with bilateral hip replacement no evident. Electronically Signed   By: Misty Stanley M.D.   On: 12/29/2015 13:02    Assessment & Plan:   There are no diagnoses linked to this encounter. I am having Ms. Stalzer maintain her Vitamin D3, meclizine, docusate sodium, polyethylene glycol, tiZANidine, promethazine, Rivaroxaban, losartan-hydrochlorothiazide, amLODipine, ALPRAZolam, acetaminophen-codeine, and anastrozole.  No orders of the defined types were placed in this encounter.     Follow-up: No Follow-up on file.  Walker Kehr, MD

## 2016-04-14 ENCOUNTER — Telehealth: Payer: Self-pay | Admitting: *Deleted

## 2016-04-14 MED ORDER — TRIAMCINOLONE ACETONIDE 0.5 % EX OINT: 1.0000 "application " | TOPICAL_OINTMENT | Freq: Three times a day (TID) | CUTANEOUS | Status: DC

## 2016-04-14 NOTE — Telephone Encounter (Signed)
Kenalog 0.5% Thx

## 2016-04-14 NOTE — Telephone Encounter (Signed)
Receive call pt states her insurance will not cover the Ultravate cream med cost over $100. Requesting md to rx something else...[name] [name]

## 2016-04-15 NOTE — Telephone Encounter (Signed)
Notified pt w/MD response.../lmb 

## 2016-06-14 ENCOUNTER — Ambulatory Visit: Payer: Medicare Other | Admitting: Internal Medicine

## 2016-06-30 ENCOUNTER — Ambulatory Visit: Payer: Medicare Other | Admitting: Internal Medicine

## 2016-07-06 ENCOUNTER — Other Ambulatory Visit (HOSPITAL_BASED_OUTPATIENT_CLINIC_OR_DEPARTMENT_OTHER): Payer: Medicare Other

## 2016-07-06 ENCOUNTER — Ambulatory Visit (HOSPITAL_BASED_OUTPATIENT_CLINIC_OR_DEPARTMENT_OTHER): Payer: Medicare Other

## 2016-07-06 ENCOUNTER — Encounter: Payer: Self-pay | Admitting: Internal Medicine

## 2016-07-06 ENCOUNTER — Ambulatory Visit (HOSPITAL_BASED_OUTPATIENT_CLINIC_OR_DEPARTMENT_OTHER): Payer: Medicare Other | Admitting: Internal Medicine

## 2016-07-06 VITALS — BP 169/77 | HR 76 | Temp 98.1°F | Resp 18 | Ht 63.0 in | Wt 234.1 lb

## 2016-07-06 DIAGNOSIS — C7951 Secondary malignant neoplasm of bone: Secondary | ICD-10-CM

## 2016-07-06 DIAGNOSIS — C50911 Malignant neoplasm of unspecified site of right female breast: Secondary | ICD-10-CM

## 2016-07-06 DIAGNOSIS — I10 Essential (primary) hypertension: Secondary | ICD-10-CM

## 2016-07-06 LAB — CBC WITH DIFFERENTIAL/PLATELET
BASO%: 0.6 % (ref 0.0–2.0)
BASOS ABS: 0 10*3/uL (ref 0.0–0.1)
EOS%: 1.9 % (ref 0.0–7.0)
Eosinophils Absolute: 0.1 10*3/uL (ref 0.0–0.5)
HEMATOCRIT: 36.7 % (ref 34.8–46.6)
HEMOGLOBIN: 11.8 g/dL (ref 11.6–15.9)
LYMPH#: 0.8 10*3/uL — AB (ref 0.9–3.3)
LYMPH%: 13 % — ABNORMAL LOW (ref 14.0–49.7)
MCH: 27 pg (ref 25.1–34.0)
MCHC: 32.1 g/dL (ref 31.5–36.0)
MCV: 84.2 fL (ref 79.5–101.0)
MONO#: 0.6 10*3/uL (ref 0.1–0.9)
MONO%: 10.2 % (ref 0.0–14.0)
NEUT#: 4.5 10*3/uL (ref 1.5–6.5)
NEUT%: 74.3 % (ref 38.4–76.8)
Platelets: 294 10*3/uL (ref 145–400)
RBC: 4.36 10*6/uL (ref 3.70–5.45)
RDW: 15.7 % — ABNORMAL HIGH (ref 11.2–14.5)
WBC: 6.1 10*3/uL (ref 3.9–10.3)

## 2016-07-06 LAB — COMPREHENSIVE METABOLIC PANEL
ALBUMIN: 3.2 g/dL — AB (ref 3.5–5.0)
ALT: 9 U/L (ref 0–55)
AST: 18 U/L (ref 5–34)
Alkaline Phosphatase: 99 U/L (ref 40–150)
Anion Gap: 12 mEq/L — ABNORMAL HIGH (ref 3–11)
BUN: 19.2 mg/dL (ref 7.0–26.0)
CALCIUM: 9.7 mg/dL (ref 8.4–10.4)
CO2: 23 meq/L (ref 22–29)
Chloride: 107 mEq/L (ref 98–109)
Creatinine: 1.2 mg/dL — ABNORMAL HIGH (ref 0.6–1.1)
EGFR: 54 mL/min/{1.73_m2} — ABNORMAL LOW (ref >90)
GLUCOSE: 105 mg/dL (ref 70–140)
Potassium: 4.1 mEq/L (ref 3.5–5.1)
Sodium: 142 mEq/L (ref 136–145)
TOTAL PROTEIN: 8 g/dL (ref 6.4–8.3)
Total Bilirubin: 0.47 mg/dL (ref 0.20–1.20)

## 2016-07-06 MED ORDER — DENOSUMAB 120 MG/1.7ML ~~LOC~~ SOLN
120.0000 mg | Freq: Once | SUBCUTANEOUS | Status: AC
  Administered 2016-07-06: 120 mg via SUBCUTANEOUS
  Filled 2016-07-06: qty 1.7

## 2016-07-06 MED ORDER — DENOSUMAB 120 MG/1.7ML ~~LOC~~ SOLN: 120.0000 mg | Freq: Once | SUBCUTANEOUS | Status: DC

## 2016-07-06 NOTE — Patient Instructions (Signed)
Denosumab injection  What is this medicine?  DENOSUMAB (den oh sue mab) slows bone breakdown. Prolia is used to treat osteoporosis in women after menopause and in men. Xgeva is used to prevent bone fractures and other bone problems caused by cancer bone metastases. Xgeva is also used to treat giant cell tumor of the bone.  This medicine may be used for other purposes; ask your health care provider or pharmacist if you have questions.  What should I tell my health care provider before I take this medicine?  They need to know if you have any of these conditions:  -dental disease  -eczema  -infection or history of infections  -kidney disease or on dialysis  -low blood calcium or vitamin D  -malabsorption syndrome  -scheduled to have surgery or tooth extraction  -taking medicine that contains denosumab  -thyroid or parathyroid disease  -an unusual reaction to denosumab, other medicines, foods, dyes, or preservatives  -pregnant or trying to get pregnant  -breast-feeding  How should I use this medicine?  This medicine is for injection under the skin. It is given by a health care professional in a hospital or clinic setting.  If you are getting Prolia, a special MedGuide will be given to you by the pharmacist with each prescription and refill. Be sure to read this information carefully each time.  For Prolia, talk to your pediatrician regarding the use of this medicine in children. Special care may be needed. For Xgeva, talk to your pediatrician regarding the use of this medicine in children. While this drug may be prescribed for children as young as 13 years for selected conditions, precautions do apply.  Overdosage: If you think you have taken too much of this medicine contact a poison control center or emergency room at once.  NOTE: This medicine is only for you. Do not share this medicine with others.  What if I miss a dose?  It is important not to miss your dose. Call your doctor or health care professional if you are  unable to keep an appointment.  What may interact with this medicine?  Do not take this medicine with any of the following medications:  -other medicines containing denosumab  This medicine may also interact with the following medications:  -medicines that suppress the immune system  -medicines that treat cancer  -steroid medicines like prednisone or cortisone  This list may not describe all possible interactions. Give your health care provider a list of all the medicines, herbs, non-prescription drugs, or dietary supplements you use. Also tell them if you smoke, drink alcohol, or use illegal drugs. Some items may interact with your medicine.  What should I watch for while using this medicine?  Visit your doctor or health care professional for regular checks on your progress. Your doctor or health care professional may order blood tests and other tests to see how you are doing.  Call your doctor or health care professional if you get a cold or other infection while receiving this medicine. Do not treat yourself. This medicine may decrease your body's ability to fight infection.  You should make sure you get enough calcium and vitamin D while you are taking this medicine, unless your doctor tells you not to. Discuss the foods you eat and the vitamins you take with your health care professional.  See your dentist regularly. Brush and floss your teeth as directed. Before you have any dental work done, tell your dentist you are receiving this medicine.  Do   not become pregnant while taking this medicine or for 5 months after stopping it. Women should inform their doctor if they wish to become pregnant or think they might be pregnant. There is a potential for serious side effects to an unborn child. Talk to your health care professional or pharmacist for more information.  What side effects may I notice from receiving this medicine?  Side effects that you should report to your doctor or health care professional as soon as  possible:  -allergic reactions like skin rash, itching or hives, swelling of the face, lips, or tongue  -breathing problems  -chest pain  -fast, irregular heartbeat  -feeling faint or lightheaded, falls  -fever, chills, or any other sign of infection  -muscle spasms, tightening, or twitches  -numbness or tingling  -skin blisters or bumps, or is dry, peels, or red  -slow healing or unexplained pain in the mouth or jaw  -unusual bleeding or bruising  Side effects that usually do not require medical attention (Report these to your doctor or health care professional if they continue or are bothersome.):  -muscle pain  -stomach upset, gas  This list may not describe all possible side effects. Call your doctor for medical advice about side effects. You may report side effects to FDA at 1-800-FDA-1088.  Where should I keep my medicine?  This medicine is only given in a clinic, doctor's office, or other health care setting and will not be stored at home.  NOTE: This sheet is a summary. It may not cover all possible information. If you have questions about this medicine, talk to your doctor, pharmacist, or health care provider.      2016, [employer]. (2012-04-17 12:37:47)

## 2016-07-06 NOTE — Progress Notes (Signed)
[facility] [phone]   [phone]  OFFICE PROGRESS NOTE  Pcp Not In System No address on file  DIAGNOSIS AND STAGE: Metastatic breast adenocarcinoma, presenting with bone metastasis in the left hip area. This was initially diagnosed as stage II (T2, N1, M0) invasive right breast carcinoma with positive estrogen and progesterone receptors and HER-2/neu positive in July 2002   PRIOR THERAPY:  1. Status post right lumpectomy with right axillary lymph node dissection revealing 4/9 lymph nodes that were positive for malignancy.  2. Status post 8 cycles of systemic chemotherapy with CMF, completed March 2003.  3. Status post radiotherapy to the remaining right breast under the care of Dr. Tammi Klippel.  4. Status post 2 years of treatment with tamoxifen, started July 2003, then switched to Femara by Dr. Humphrey Rolls in August 2005. The patient discontinued the treatment in February 2009.  5. Status post left total hip replacement on Apr 01, 2011, secondary to pathologic left femoral neck fracture. 6. Zometa 4 mg IV on 3 monthly basis. Discontinued secondary to renal insufficiency.   CURRENT THERAPY:  1. Arimidex 1 mg p.o. daily, started 03/18/2011.  2. Xgeva 120 mcg subcutaneously on 3 monthly basis.  CHEMOTHERAPY INTENT: Palliative  CURRENT # OF HORMONAL THERAPY CYCLES: 41 CURRENT ANTIEMETICS: None  CURRENT SMOKING STATUS: Nonsmoker  ORAL HORMONAL THERAPY AND CONSENT: Yes.  CURRENT BISPHOSPHONATES USE: Zometa with stable renal function.  PAIN MANAGEMENT: Tramadol when necessary  NARCOTICS INDUCED CONSTIPATION: None  LIVING WILL AND CODE STATUS: Full code.   INTERVAL HISTORY:  [name] [name] 75 y.o. female returns to the clinic today for followup visit. The patient is doing well today except for arthralgia and the shoulders bilaterally. She has no significant weight loss or night sweats. She has no nausea or vomiting. The patient denied having any  significant chest pain, but has shortness of breath with exertion with no cough or hemoptysis. She had repeat CBC, comprehensive metabolic panel and CA 81.85 performed earlier today and she is here for evaluation and discussion of her lab results.  MEDICAL HISTORY: Past Medical History:  Diagnosis Date  . Anxiety    hx of  . Asthma   . Breast cancer ([place])    2002; metastatic in 2012, right breast, spread to left hip in 2012  . Cerebrovascular accident [facility])    R thalamic CVA 01/2010  . Dyslipidemia   . Hyperlipemia   . Hypertension   . Low back pain   . Osteoarthritis   . TIA (transient ischemic attack)    x2 in 2011  . Vertigo 2014    ALLERGIES:  has No Known Allergies.  MEDICATIONS:  Current Outpatient Prescriptions  Medication Sig Dispense Refill  . ALPRAZolam (XANAX) 0.5 MG tablet Take 1 tablet (0.5 mg total) by mouth 2 (two) times daily as needed for anxiety or sleep. 60 tablet 2  . amLODipine (NORVASC) 10 MG tablet Take 1 tablet (10 mg total) by mouth daily. 90 tablet 3  . anastrozole (ARIMIDEX) 1 MG tablet Take 1 tablet (1 mg total) by mouth daily. 90 tablet 2  . Cholecalciferol (VITAMIN D3) 1000 UNITS CAPS Take 1 capsule by mouth daily.     Marland Kitchen docusate sodium (COLACE) 100 MG capsule Take 1 capsule (100 mg total) by mouth 2 (two) times daily. 10 capsule 0  . losartan-hydrochlorothiazide (HYZAAR) 100-25 MG tablet Take 1 tablet by mouth daily. 90 tablet 3  . meclizine (ANTIVERT) 12.5 MG  tablet Take 1 tablet (12.5 mg total) by mouth 2 (two) times daily as needed for dizziness. 10 tablet 0  . polyethylene glycol (MIRALAX / GLYCOLAX) packet Take 17 g by mouth 2 (two) times daily. 14 each 0  . rivaroxaban (XARELTO) 20 MG TABS tablet Take 1 tablet (20 mg total) by mouth daily. 30 tablet 11  . tiZANidine (ZANAFLEX) 4 MG tablet Take 1 tablet (4 mg total) by mouth every 6 (six) hours as needed for muscle spasms. 40 tablet 0  . triamcinolone ointment (KENALOG) 0.5 % Apply 1  application topically 3 (three) times daily. 30 g 2  . acetaminophen-codeine (TYLENOL #2) 300-15 MG tablet Take 1 tablet by mouth every 6 (six) hours as needed for severe pain. (Patient not taking: Reported on 07/06/2016) 90 tablet 3  . promethazine (PHENERGAN) 12.5 MG tablet Take 1 tablet (12.5 mg total) by mouth every 6 (six) hours as needed for nausea or vomiting. (Patient not taking: Reported on 07/06/2016) 30 tablet 0   No current facility-administered medications for this visit.     SURGICAL HISTORY:  Past Surgical History:  Procedure Laterality Date  . ABDOMINAL HYSTERECTOMY     complete  . BREAST LUMPECTOMY     Right breast 2002  . JOINT REPLACEMENT  2012   L hip due to breast ca met  . TOTAL HIP ARTHROPLASTY Right 10/28/2015   Procedure: RIGHT TOTAL HIP ARTHROPLASTY ANTERIOR APPROACH;  Surgeon: Paralee Cancel, MD;  Location: [facility];  Service: Orthopedics;  Laterality: Right;    REVIEW OF SYSTEMS:  A comprehensive review of systems was negative except for: Constitutional: positive for fatigue Musculoskeletal: positive for arthralgias   PHYSICAL EXAMINATION: General appearance: alert, cooperative, fatigued and no distress Head: Normocephalic, without obvious abnormality, atraumatic Neck: no adenopathy, no JVD, supple, symmetrical, trachea midline and thyroid not enlarged, symmetric, no tenderness/mass/nodules Lymph nodes: Cervical, supraclavicular, and axillary nodes normal. Resp: clear to auscultation bilaterally Back: symmetric, no curvature. ROM normal. No CVA tenderness. Cardio: regular rate and rhythm, S1, S2 normal, no murmur, click, rub or gallop GI: soft, non-tender; bowel sounds normal; no masses,  no organomegaly Extremities: extremities normal, atraumatic, no cyanosis or edema Neurologic: Alert and oriented X 3, normal strength and tone. Normal symmetric reflexes. Normal coordination and gait  ECOG PERFORMANCE STATUS: 1 - Symptomatic but completely ambulatory  Blood  pressure (!) 169/77, pulse 76, temperature 98.1 F (36.7 C), temperature source Oral, resp. rate 18, height 5' 3"  (1.6 m), weight 234 lb 1.6 oz (106.2 kg), SpO2 100 %.  LABORATORY DATA: Lab Results  Component Value Date   WBC 6.1 07/06/2016   HGB 11.8 07/06/2016   HCT 36.7 07/06/2016   MCV 84.2 07/06/2016   PLT 294 07/06/2016      Chemistry      Component Value Date/Time   NA 142 04/05/2016 0915   K 3.8 04/05/2016 0915   CL 105 03/02/2016 0834   CL 109 (H) 04/25/2013 1023   CO2 24 04/05/2016 0915   BUN 13.9 04/05/2016 0915   CREATININE 1.0 04/05/2016 0915      Component Value Date/Time   CALCIUM 8.8 04/05/2016 0915   ALKPHOS 75 04/05/2016 0915   AST 14 04/05/2016 0915   ALT <9 04/05/2016 0915   BILITOT <0.30 04/05/2016 0915      RADIOGRAPHIC STUDIES: No results found. ASSESSMENT AND PLAN: This is a very pleasant 75 years old African American female with metastatic breast adenocarcinoma with bone metastasis. The patient is currently on hormonal  therapy with Arimidex with no significant evidence for disease progression.   CBC today is unremarkable. Comprehensive metabolic panel and CA 48.54 are still pending I recommended for the patient to resume her treatment with Arimidex. The patient will also continue her current treatment with Xgeva 120 mcg subcutaneously every 3 months. She will receive a dose today. She will come back for follow-up visit in 3 months with repeat blood work. She will have Port-A-Cath flush every 6 weeks. She was advised to call immediately if she has any concerning symptoms in the interval. The patient voices understanding of current disease status and treatment options and is in agreement with the current care plan.  All questions were answered. The patient knows to call the clinic with any problems, questions or concerns. We can certainly see the patient much sooner if necessary.  Disclaimer: This note was dictated with voice recognition software.  Similar sounding words can inadvertently be transcribed and may not be corrected upon review.

## 2016-07-07 LAB — CANCER ANTIGEN 27.29: CA 27.29: 61 U/mL — ABNORMAL HIGH (ref 0.0–38.6)

## 2016-07-12 ENCOUNTER — Telehealth: Payer: Self-pay | Admitting: Internal Medicine

## 2016-07-12 NOTE — Telephone Encounter (Signed)
Spoke with patient re next appointment for 9/19. Patient to get schedule 9/19.

## 2016-07-13 ENCOUNTER — Encounter: Payer: Self-pay | Admitting: Internal Medicine

## 2016-07-13 ENCOUNTER — Ambulatory Visit (INDEPENDENT_AMBULATORY_CARE_PROVIDER_SITE_OTHER): Payer: Medicare Other | Admitting: Internal Medicine

## 2016-07-13 DIAGNOSIS — I10 Essential (primary) hypertension: Secondary | ICD-10-CM | POA: Diagnosis not present

## 2016-07-13 DIAGNOSIS — M7989 Other specified soft tissue disorders: Secondary | ICD-10-CM | POA: Diagnosis not present

## 2016-07-13 DIAGNOSIS — K219 Gastro-esophageal reflux disease without esophagitis: Secondary | ICD-10-CM

## 2016-07-13 MED ORDER — SPIRONOLACTONE 50 MG PO TABS: 50.0000 mg | ORAL_TABLET | Freq: Every day | ORAL | 3 refills | Status: DC

## 2016-07-13 MED ORDER — PANTOPRAZOLE SODIUM 40 MG PO TBEC: 40.0000 mg | DELAYED_RELEASE_TABLET | Freq: Every day | ORAL | 3 refills | Status: DC

## 2016-07-13 MED ORDER — AMLODIPINE BESYLATE 10 MG PO TABS: 5.0000 mg | ORAL_TABLET | Freq: Every day | ORAL | 3 refills | Status: DC

## 2016-07-13 NOTE — Assessment & Plan Note (Signed)
Reduce Norvasc dose due to swelling LLE [place]

## 2016-07-13 NOTE — Progress Notes (Signed)
Subjective:  Patient ID: [name] [name], female    DOB: 1941/08/03  Age: 75 y.o. MRN: [id number]  CC: Follow-up   HPI [name] [name] presents for GERD and problems swallowing x 2 weeks C/o LLE swelling L>R chronic F/u anxiety  Outpatient Medications Prior to Visit  Medication Sig Dispense Refill  . acetaminophen-codeine (TYLENOL #2) 300-15 MG tablet Take 1 tablet by mouth every 6 (six) hours as needed for severe pain. 90 tablet 3  . ALPRAZolam (XANAX) 0.5 MG tablet Take 1 tablet (0.5 mg total) by mouth 2 (two) times daily as needed for anxiety or sleep. 60 tablet 2  . amLODipine (NORVASC) 10 MG tablet Take 1 tablet (10 mg total) by mouth daily. 90 tablet 3  . anastrozole (ARIMIDEX) 1 MG tablet Take 1 tablet (1 mg total) by mouth daily. 90 tablet 2  . Cholecalciferol (VITAMIN D3) 1000 UNITS CAPS Take 1 capsule by mouth daily.     Marland Kitchen docusate sodium (COLACE) 100 MG capsule Take 1 capsule (100 mg total) by mouth 2 (two) times daily. 10 capsule 0  . losartan-hydrochlorothiazide (HYZAAR) 100-25 MG tablet Take 1 tablet by mouth daily. 90 tablet 3  . meclizine (ANTIVERT) 12.5 MG tablet Take 1 tablet (12.5 mg total) by mouth 2 (two) times daily as needed for dizziness. 10 tablet 0  . polyethylene glycol (MIRALAX / GLYCOLAX) packet Take 17 g by mouth 2 (two) times daily. 14 each 0  . promethazine (PHENERGAN) 12.5 MG tablet Take 1 tablet (12.5 mg total) by mouth every 6 (six) hours as needed for nausea or vomiting. 30 tablet 0  . rivaroxaban (XARELTO) 20 MG TABS tablet Take 1 tablet (20 mg total) by mouth daily. 30 tablet 11  . tiZANidine (ZANAFLEX) 4 MG tablet Take 1 tablet (4 mg total) by mouth every 6 (six) hours as needed for muscle spasms. 40 tablet 0  . triamcinolone ointment (KENALOG) 0.5 % Apply 1 application topically 3 (three) times daily. 30 g 2   No facility-administered medications prior to visit.     ROS Review of Systems  Constitutional: Negative for activity change,  appetite change, chills, fatigue and unexpected weight change.  HENT: Negative for congestion, mouth sores and sinus pressure.   Eyes: Negative for visual disturbance.  Respiratory: Negative for cough and chest tightness.   Cardiovascular: Positive for leg swelling.  Gastrointestinal: Negative for abdominal pain and nausea.  Genitourinary: Negative for difficulty urinating, frequency and vaginal pain.  Musculoskeletal: Negative for back pain and gait problem.  Skin: Negative for pallor and rash.  Neurological: Negative for dizziness, tremors, weakness, numbness and headaches.  Psychiatric/Behavioral: Negative for confusion and sleep disturbance.    Objective:  BP (!) 168/72   Pulse 93   Wt 237 lb (107.5 kg)   SpO2 93%   BMI 41.98 kg/m   BP Readings from Last 3 Encounters:  07/13/16 (!) 168/72  07/06/16 (!) 169/77  04/13/16 (!) 148/90    Wt Readings from Last 3 Encounters:  07/13/16 237 lb (107.5 kg)  07/06/16 234 lb 1.6 oz (106.2 kg)  04/13/16 230 lb (104.3 kg)    Physical Exam  Constitutional: She appears well-developed. No distress.  HENT:  Head: Normocephalic.  Right Ear: External ear normal.  Left Ear: External ear normal.  Nose: Nose normal.  Mouth/Throat: Oropharynx is clear and moist.  Eyes: Conjunctivae are normal. Pupils are equal, round, and reactive to light. Right eye exhibits no discharge. Left eye exhibits no discharge.  Neck: Normal  range of motion. Neck supple. No JVD present. No tracheal deviation present. No thyromegaly present.  Cardiovascular: Normal rate, regular rhythm and normal heart sounds.   Pulmonary/Chest: No stridor. No respiratory distress. She has no wheezes.  Abdominal: Soft. Bowel sounds are normal. She exhibits no distension and no mass. There is no tenderness. There is no rebound and no guarding.  Musculoskeletal: She exhibits edema and tenderness.  Lymphadenopathy:    She has no cervical adenopathy.  Neurological: She displays  normal reflexes. No cranial nerve deficit. She exhibits normal muscle tone. Coordination abnormal.  Skin: No rash noted. No erythema.  Psychiatric: She has a normal mood and affect. Her behavior is normal. Judgment and thought content normal.  Cane Edema LLE 1+; RLE - trace Calves NT B    Ct Chest W Contrast  Result Date: 12/29/2015 CLINICAL DATA:  Right breast cancer with right lumpectomy. EXAM: CT CHEST, ABDOMEN, AND PELVIS WITH CONTRAST TECHNIQUE: Multidetector CT imaging of the chest, abdomen and pelvis was performed following the standard protocol during bolus administration of intravenous contrast. CONTRAST:  64mL OMNIPAQUE IOHEXOL 300 MG/ML  SOLN COMPARISON:  From 09/04/2014. FINDINGS: CT CHEST FINDINGS Mediastinum/Lymph Nodes: There is no axillary lymphadenopathy. Surgical clips again noted in the right axilla with associated scarring. No mediastinal lymphadenopathy. 11 mm right infrahilar lymph node (image 32 series 2 was only 5 mm previously. No left hilar lymphadenopathy. The heart size is normal. No pericardial effusion. Coronary artery calcification is noted. The esophagus has normal imaging features. Stable 9 mm left thyroid nodule left Port-A-Cath remains in place with tip positioned at the distal SVC level. Probable retained cuff from previous tunneled central line in the left anterior chest wall. Lungs/Pleura: Probable anterior subpleural radiation fibrosis right lung. The basilar chronic atelectasis or scarring in the lower lobes is stable. No suspicious pulmonary nodule or mass. No focal airspace consolidation. No pleural effusion. Musculoskeletal: Bone windows reveal no worrisome lytic or sclerotic osseous lesions. CT ABDOMEN PELVIS FINDINGS Hepatobiliary: No focal abnormality within the liver parenchyma. Small calcified stones are seen in the gallbladder. No intrahepatic or extrahepatic biliary dilation. Pancreas: No focal mass lesion. No dilatation of the main duct. No  intraparenchymal cyst. No peripancreatic edema. Spleen: No splenomegaly. No focal mass lesion. Adrenals/Urinary Tract: No adrenal nodule or mass. Scattered areas of mild cortical scarring are noted in the kidneys bilaterally. No enhancing renal mass lesion. No hydronephrosis. No evidence for hydroureter. The distal ureters and posterior bladder are obscured from streak artifact due to bilateral hip replacement. Stomach/Bowel: Small hiatal hernia noted. Stomach otherwise unremarkable. Duodenum is normally positioned as is the ligament of Treitz. No small bowel wall thickening. No small bowel dilatation. Portions of the pelvic small bowel are obscured by streak artifact. The terminal ileum is normal. The appendix is normal. No gross colonic mass. No colonic wall thickening. No substantial diverticular change. Vascular/Lymphatic: There is abdominal aortic atherosclerosis without aneurysm. There is no gastrohepatic or hepatoduodenal ligament lymphadenopathy. No intraperitoneal or retroperitoneal lymphadenopathy. No pelvic sidewall lymphadenopathy. Reproductive: Uterus is surgically absent. There is no adnexal mass. Other: No intraperitoneal free fluid. Musculoskeletal: Patient is status post bilateral hip replacement Bone windows reveal no worrisome lytic or sclerotic osseous lesions. IMPRESSION: 1. Slight interval increase in a borderline enlarged right infrahilar lymph node. While this is indeterminate, close follow-up is recommended as metastatic disease cannot be entirely excluded. 2. Otherwise stable exam. 3. Coronary artery atherosclerosis. 4. Cholelithiasis 5. Interval right hip replacement with bilateral hip replacement no evident. Electronically  Signed   By: Misty Stanley M.D.   On: 12/29/2015 13:02   Nm Bone Scan Whole Body  Result Date: 12/29/2015 CLINICAL DATA:  Breast cancer. EXAM: NUCLEAR MEDICINE WHOLE BODY BONE SCAN TECHNIQUE: Whole body anterior and posterior images were obtained approximately 3  hours after intravenous injection of radiopharmaceutical. RADIOPHARMACEUTICALS:  25.5 mCi Technetium-52m MDP IV COMPARISON:  CT 12/29/2015, PET-CT 09/04/2014. FINDINGS: Bilateral renal function and excretion. Multi focal areas of increased activity noted the lumbar spine and thoracic spine consistent with degenerative change again noted. Bilateral hip replacements. Focal area of increased activity noted about the right acetabulum, this could be secondary to the hip replacement. Loosening cannot be completely excluded. Focal bony lesion cannot be excluded. Left hip series suggested for further evaluation. IMPRESSION: 1. Stable thoracolumbar spine activity most likely degenerative. 2. Focal area of increased activity noted over the left acetabulum. This may be secondary to right hip replacement. Loosening of the acetabular cup cannot be excluded. To exclude a focal lesion right hip series suggested . Electronically Signed   By: Marcello Moores  Register   On: 12/29/2015 13:21   Ct Abdomen Pelvis W Contrast  Result Date: 12/29/2015 CLINICAL DATA:  Right breast cancer with right lumpectomy. EXAM: CT CHEST, ABDOMEN, AND PELVIS WITH CONTRAST TECHNIQUE: Multidetector CT imaging of the chest, abdomen and pelvis was performed following the standard protocol during bolus administration of intravenous contrast. CONTRAST:  46mL OMNIPAQUE IOHEXOL 300 MG/ML  SOLN COMPARISON:  From 09/04/2014. FINDINGS: CT CHEST FINDINGS Mediastinum/Lymph Nodes: There is no axillary lymphadenopathy. Surgical clips again noted in the right axilla with associated scarring. No mediastinal lymphadenopathy. 11 mm right infrahilar lymph node (image 32 series 2 was only 5 mm previously. No left hilar lymphadenopathy. The heart size is normal. No pericardial effusion. Coronary artery calcification is noted. The esophagus has normal imaging features. Stable 9 mm left thyroid nodule left Port-A-Cath remains in place with tip positioned at the distal SVC level.  Probable retained cuff from previous tunneled central line in the left anterior chest wall. Lungs/Pleura: Probable anterior subpleural radiation fibrosis right lung. The basilar chronic atelectasis or scarring in the lower lobes is stable. No suspicious pulmonary nodule or mass. No focal airspace consolidation. No pleural effusion. Musculoskeletal: Bone windows reveal no worrisome lytic or sclerotic osseous lesions. CT ABDOMEN PELVIS FINDINGS Hepatobiliary: No focal abnormality within the liver parenchyma. Small calcified stones are seen in the gallbladder. No intrahepatic or extrahepatic biliary dilation. Pancreas: No focal mass lesion. No dilatation of the main duct. No intraparenchymal cyst. No peripancreatic edema. Spleen: No splenomegaly. No focal mass lesion. Adrenals/Urinary Tract: No adrenal nodule or mass. Scattered areas of mild cortical scarring are noted in the kidneys bilaterally. No enhancing renal mass lesion. No hydronephrosis. No evidence for hydroureter. The distal ureters and posterior bladder are obscured from streak artifact due to bilateral hip replacement. Stomach/Bowel: Small hiatal hernia noted. Stomach otherwise unremarkable. Duodenum is normally positioned as is the ligament of Treitz. No small bowel wall thickening. No small bowel dilatation. Portions of the pelvic small bowel are obscured by streak artifact. The terminal ileum is normal. The appendix is normal. No gross colonic mass. No colonic wall thickening. No substantial diverticular change. Vascular/Lymphatic: There is abdominal aortic atherosclerosis without aneurysm. There is no gastrohepatic or hepatoduodenal ligament lymphadenopathy. No intraperitoneal or retroperitoneal lymphadenopathy. No pelvic sidewall lymphadenopathy. Reproductive: Uterus is surgically absent. There is no adnexal mass. Other: No intraperitoneal free fluid. Musculoskeletal: Patient is status post bilateral hip replacement Bone windows reveal  no worrisome  lytic or sclerotic osseous lesions. IMPRESSION: 1. Slight interval increase in a borderline enlarged right infrahilar lymph node. While this is indeterminate, close follow-up is recommended as metastatic disease cannot be entirely excluded. 2. Otherwise stable exam. 3. Coronary artery atherosclerosis. 4. Cholelithiasis 5. Interval right hip replacement with bilateral hip replacement no evident. Electronically Signed   By: Misty Stanley M.D.   On: 12/29/2015 13:02    Assessment & Plan:   There are no diagnoses linked to this encounter. I am having Ms. Sancho maintain her Vitamin D3, meclizine, docusate sodium, polyethylene glycol, tiZANidine, promethazine, losartan-hydrochlorothiazide, amLODipine, ALPRAZolam, acetaminophen-codeine, anastrozole, rivaroxaban, and triamcinolone ointment.  No orders of the defined types were placed in this encounter.    Follow-up: No Follow-up on file.  Walker Kehr, MD

## 2016-07-13 NOTE — Assessment & Plan Note (Signed)
Reduce Norvasc due to swelling 

## 2016-07-13 NOTE — Assessment & Plan Note (Signed)
9/17 worse - start Protonix GI ref

## 2016-07-20 ENCOUNTER — Ambulatory Visit (HOSPITAL_BASED_OUTPATIENT_CLINIC_OR_DEPARTMENT_OTHER): Payer: Medicare Other

## 2016-07-20 DIAGNOSIS — C50911 Malignant neoplasm of unspecified site of right female breast: Secondary | ICD-10-CM

## 2016-07-20 DIAGNOSIS — Z452 Encounter for adjustment and management of vascular access device: Secondary | ICD-10-CM

## 2016-07-20 DIAGNOSIS — C7951 Secondary malignant neoplasm of bone: Secondary | ICD-10-CM

## 2016-07-20 DIAGNOSIS — Z95828 Presence of other vascular implants and grafts: Secondary | ICD-10-CM

## 2016-07-20 MED ORDER — HEPARIN SOD (PORK) LOCK FLUSH 100 UNIT/ML IV SOLN
500.0000 [IU] | Freq: Once | INTRAVENOUS | Status: AC | PRN
  Administered 2016-07-20: 500 [IU] via INTRAVENOUS
  Filled 2016-07-20: qty 5

## 2016-07-20 MED ORDER — SODIUM CHLORIDE 0.9 % IJ SOLN
10.0000 mL | INTRAMUSCULAR | Status: DC | PRN
  Administered 2016-07-20: 10 mL via INTRAVENOUS
  Filled 2016-07-20: qty 10

## 2016-07-20 NOTE — Patient Instructions (Signed)

## 2016-08-26 ENCOUNTER — Other Ambulatory Visit (INDEPENDENT_AMBULATORY_CARE_PROVIDER_SITE_OTHER): Payer: Medicare Other

## 2016-08-26 ENCOUNTER — Encounter: Payer: Self-pay | Admitting: Internal Medicine

## 2016-08-26 ENCOUNTER — Ambulatory Visit (INDEPENDENT_AMBULATORY_CARE_PROVIDER_SITE_OTHER): Payer: Medicare Other | Admitting: Internal Medicine

## 2016-08-26 ENCOUNTER — Ambulatory Visit (INDEPENDENT_AMBULATORY_CARE_PROVIDER_SITE_OTHER)
Admission: RE | Admit: 2016-08-26 | Discharge: 2016-08-26 | Disposition: A | Discharge status: Home / Self Care | Payer: Medicare Other | Source: Ambulatory Visit | Attending: Internal Medicine | Admitting: Internal Medicine

## 2016-08-26 VITALS — BP 140/64 | HR 82 | Temp 97.9°F | Wt 236.0 lb

## 2016-08-26 DIAGNOSIS — C7951 Secondary malignant neoplasm of bone: Secondary | ICD-10-CM

## 2016-08-26 DIAGNOSIS — G8929 Other chronic pain: Secondary | ICD-10-CM | POA: Diagnosis not present

## 2016-08-26 DIAGNOSIS — C50911 Malignant neoplasm of unspecified site of right female breast: Secondary | ICD-10-CM | POA: Diagnosis not present

## 2016-08-26 DIAGNOSIS — M545 Low back pain: Secondary | ICD-10-CM

## 2016-08-26 DIAGNOSIS — M25512 Pain in left shoulder: Secondary | ICD-10-CM

## 2016-08-26 DIAGNOSIS — Z23 Encounter for immunization: Secondary | ICD-10-CM | POA: Diagnosis not present

## 2016-08-26 DIAGNOSIS — M159 Polyosteoarthritis, unspecified: Secondary | ICD-10-CM

## 2016-08-26 DIAGNOSIS — I10 Essential (primary) hypertension: Secondary | ICD-10-CM

## 2016-08-26 DIAGNOSIS — M150 Primary generalized (osteo)arthritis: Secondary | ICD-10-CM | POA: Diagnosis not present

## 2016-08-26 LAB — URINALYSIS
Bilirubin Urine: NEGATIVE
Hgb urine dipstick: NEGATIVE
KETONES UR: NEGATIVE
Leukocytes, UA: NEGATIVE
Nitrite: NEGATIVE
PH: 6.5 (ref 5.0–8.0)
SPECIFIC GRAVITY, URINE: 1.02 (ref 1.000–1.030)
URINE GLUCOSE: NEGATIVE
Urobilinogen, UA: 0.2 (ref 0.0–1.0)

## 2016-08-26 MED ORDER — ACETAMINOPHEN-CODEINE #2 300-15 MG PO TABS
1.0000 | ORAL_TABLET | Freq: Four times a day (QID) | ORAL | 3 refills | Status: DC | PRN
Start: 2016-08-26 — End: 2017-04-19

## 2016-08-26 NOTE — Progress Notes (Signed)
Subjective:  Patient ID: [name] [name], female    DOB: [birth year]  Age: 75 y.o. MRN: [id number]  CC: No chief complaint on file.   HPI TZIVIA ONEIL presents for LBP 7-8/10 x 2 months and sharp catch in L>>R shoulder w/ROM x1 mo. No injury. Tylenol/Aleve help w/pain. No CP F/u HTN  Outpatient Medications Prior to Visit  Medication Sig Dispense Refill  . acetaminophen-codeine (TYLENOL #2) 300-15 MG tablet Take 1 tablet by mouth every 6 (six) hours as needed for severe pain. 90 tablet 3  . ALPRAZolam (XANAX) 0.5 MG tablet Take 1 tablet (0.5 mg total) by mouth 2 (two) times daily as needed for anxiety or sleep. 60 tablet 2  . amLODipine (NORVASC) 10 MG tablet Take 0.5 tablets (5 mg total) by mouth daily. 90 tablet 3  . anastrozole (ARIMIDEX) 1 MG tablet Take 1 tablet (1 mg total) by mouth daily. 90 tablet 2  . Cholecalciferol (VITAMIN D3) 1000 UNITS CAPS Take 1 capsule by mouth daily.     Marland Kitchen docusate sodium (COLACE) 100 MG capsule Take 1 capsule (100 mg total) by mouth 2 (two) times daily. 10 capsule 0  . losartan-hydrochlorothiazide (HYZAAR) 100-25 MG tablet Take 1 tablet by mouth daily. 90 tablet 3  . meclizine (ANTIVERT) 12.5 MG tablet Take 1 tablet (12.5 mg total) by mouth 2 (two) times daily as needed for dizziness. 10 tablet 0  . pantoprazole (PROTONIX) 40 MG tablet Take 1 tablet (40 mg total) by mouth daily. 90 tablet 3  . polyethylene glycol (MIRALAX / GLYCOLAX) packet Take 17 g by mouth 2 (two) times daily. 14 each 0  . promethazine (PHENERGAN) 12.5 MG tablet Take 1 tablet (12.5 mg total) by mouth every 6 (six) hours as needed for nausea or vomiting. 30 tablet 0  . rivaroxaban (XARELTO) 20 MG TABS tablet Take 1 tablet (20 mg total) by mouth daily. 30 tablet 11  . spironolactone (ALDACTONE) 50 MG tablet Take 1 tablet (50 mg total) by mouth daily. 90 tablet 3  . tiZANidine (ZANAFLEX) 4 MG tablet Take 1 tablet (4 mg total) by mouth every 6 (six) hours as needed for muscle spasms.  40 tablet 0  . triamcinolone ointment (KENALOG) 0.5 % Apply 1 application topically 3 (three) times daily. 30 g 2   No facility-administered medications prior to visit.     ROS Review of Systems  Constitutional: Negative for activity change, appetite change, chills, fatigue and unexpected weight change.  HENT: Negative for congestion, mouth sores and sinus pressure.   Eyes: Negative for visual disturbance.  Respiratory: Negative for cough and chest tightness.   Gastrointestinal: Negative for abdominal pain and nausea.  Genitourinary: Negative for difficulty urinating, frequency and vaginal pain.  Musculoskeletal: Positive for arthralgias, back pain and gait problem.  Skin: Negative for pallor and rash.  Neurological: Negative for dizziness, tremors, weakness, numbness and headaches.  Psychiatric/Behavioral: Negative for confusion and sleep disturbance.    Objective:  BP 140/64   Pulse 82   Temp 97.9 F (36.6 C) (Oral)   Wt 236 lb (107 kg)   SpO2 97%   BMI 41.81 kg/m   BP Readings from Last 3 Encounters:  08/26/16 140/64  07/13/16 (!) 168/72  07/06/16 (!) 169/77    Wt Readings from Last 3 Encounters:  08/26/16 236 lb (107 kg)  07/13/16 237 lb (107.5 kg)  07/06/16 234 lb 1.6 oz (106.2 kg)    Physical Exam  Constitutional: She appears well-developed. No distress.  HENT:  Head: Normocephalic.  Right Ear: External ear normal.  Left Ear: External ear normal.  Nose: Nose normal.  Mouth/Throat: Oropharynx is clear and moist.  Eyes: Conjunctivae are normal. Pupils are equal, round, and reactive to light. Right eye exhibits no discharge. Left eye exhibits no discharge.  Neck: Normal range of motion. Neck supple. No JVD present. No tracheal deviation present. No thyromegaly present.  Cardiovascular: Normal rate, regular rhythm and normal heart sounds.   Pulmonary/Chest: No stridor. No respiratory distress. She has no wheezes.  Abdominal: Soft. Bowel sounds are normal. She  exhibits no distension and no mass. There is no tenderness. There is no rebound and no guarding.  Musculoskeletal: She exhibits tenderness. She exhibits no edema.  Lymphadenopathy:    She has no cervical adenopathy.  Neurological: She displays normal reflexes. No cranial nerve deficit. She exhibits normal muscle tone. Coordination normal.  Skin: No rash noted. No erythema.  Psychiatric: She has a normal mood and affect. Her behavior is normal. Judgment and thought content normal.  Obese Using a cane LS is tender L shoulder is tender  Procedure: EKG Indication: L shoulder pain Impression: NSR. No acute changes.    Ct Chest W Contrast  Result Date: 12/29/2015 CLINICAL DATA:  Right breast cancer with right lumpectomy. EXAM: CT CHEST, ABDOMEN, AND PELVIS WITH CONTRAST TECHNIQUE: Multidetector CT imaging of the chest, abdomen and pelvis was performed following the standard protocol during bolus administration of intravenous contrast. CONTRAST:  62mL OMNIPAQUE IOHEXOL 300 MG/ML  SOLN COMPARISON:  From 09/04/2014. FINDINGS: CT CHEST FINDINGS Mediastinum/Lymph Nodes: There is no axillary lymphadenopathy. Surgical clips again noted in the right axilla with associated scarring. No mediastinal lymphadenopathy. 11 mm right infrahilar lymph node (image 32 series 2 was only 5 mm previously. No left hilar lymphadenopathy. The heart size is normal. No pericardial effusion. Coronary artery calcification is noted. The esophagus has normal imaging features. Stable 9 mm left thyroid nodule left Port-A-Cath remains in place with tip positioned at the distal SVC level. Probable retained cuff from previous tunneled central line in the left anterior chest wall. Lungs/Pleura: Probable anterior subpleural radiation fibrosis right lung. The basilar chronic atelectasis or scarring in the lower lobes is stable. No suspicious pulmonary nodule or mass. No focal airspace consolidation. No pleural effusion. Musculoskeletal: Bone  windows reveal no worrisome lytic or sclerotic osseous lesions. CT ABDOMEN PELVIS FINDINGS Hepatobiliary: No focal abnormality within the liver parenchyma. Small calcified stones are seen in the gallbladder. No intrahepatic or extrahepatic biliary dilation. Pancreas: No focal mass lesion. No dilatation of the main duct. No intraparenchymal cyst. No peripancreatic edema. Spleen: No splenomegaly. No focal mass lesion. Adrenals/Urinary Tract: No adrenal nodule or mass. Scattered areas of mild cortical scarring are noted in the kidneys bilaterally. No enhancing renal mass lesion. No hydronephrosis. No evidence for hydroureter. The distal ureters and posterior bladder are obscured from streak artifact due to bilateral hip replacement. Stomach/Bowel: Small hiatal hernia noted. Stomach otherwise unremarkable. Duodenum is normally positioned as is the ligament of Treitz. No small bowel wall thickening. No small bowel dilatation. Portions of the pelvic small bowel are obscured by streak artifact. The terminal ileum is normal. The appendix is normal. No gross colonic mass. No colonic wall thickening. No substantial diverticular change. Vascular/Lymphatic: There is abdominal aortic atherosclerosis without aneurysm. There is no gastrohepatic or hepatoduodenal ligament lymphadenopathy. No intraperitoneal or retroperitoneal lymphadenopathy. No pelvic sidewall lymphadenopathy. Reproductive: Uterus is surgically absent. There is no adnexal mass. Other: No intraperitoneal free fluid. Musculoskeletal: Patient  is status post bilateral hip replacement Bone windows reveal no worrisome lytic or sclerotic osseous lesions. IMPRESSION: 1. Slight interval increase in a borderline enlarged right infrahilar lymph node. While this is indeterminate, close follow-up is recommended as metastatic disease cannot be entirely excluded. 2. Otherwise stable exam. 3. Coronary artery atherosclerosis. 4. Cholelithiasis 5. Interval right hip replacement  with bilateral hip replacement no evident. Electronically Signed   By: Misty Stanley M.D.   On: 12/29/2015 13:02   Nm Bone Scan Whole Body  Result Date: 12/29/2015 CLINICAL DATA:  Breast cancer. EXAM: NUCLEAR MEDICINE WHOLE BODY BONE SCAN TECHNIQUE: Whole body anterior and posterior images were obtained approximately 3 hours after intravenous injection of radiopharmaceutical. RADIOPHARMACEUTICALS:  25.5 mCi Technetium-23m MDP IV COMPARISON:  CT 12/29/2015, PET-CT 09/04/2014. FINDINGS: Bilateral renal function and excretion. Multi focal areas of increased activity noted the lumbar spine and thoracic spine consistent with degenerative change again noted. Bilateral hip replacements. Focal area of increased activity noted about the right acetabulum, this could be secondary to the hip replacement. Loosening cannot be completely excluded. Focal bony lesion cannot be excluded. Left hip series suggested for further evaluation. IMPRESSION: 1. Stable thoracolumbar spine activity most likely degenerative. 2. Focal area of increased activity noted over the left acetabulum. This may be secondary to right hip replacement. Loosening of the acetabular cup cannot be excluded. To exclude a focal lesion right hip series suggested . Electronically Signed   By: Marcello Moores  Register   On: 12/29/2015 13:21   Ct Abdomen Pelvis W Contrast  Result Date: 12/29/2015 CLINICAL DATA:  Right breast cancer with right lumpectomy. EXAM: CT CHEST, ABDOMEN, AND PELVIS WITH CONTRAST TECHNIQUE: Multidetector CT imaging of the chest, abdomen and pelvis was performed following the standard protocol during bolus administration of intravenous contrast. CONTRAST:  15mL OMNIPAQUE IOHEXOL 300 MG/ML  SOLN COMPARISON:  From 09/04/2014. FINDINGS: CT CHEST FINDINGS Mediastinum/Lymph Nodes: There is no axillary lymphadenopathy. Surgical clips again noted in the right axilla with associated scarring. No mediastinal lymphadenopathy. 11 mm right infrahilar lymph  node (image 32 series 2 was only 5 mm previously. No left hilar lymphadenopathy. The heart size is normal. No pericardial effusion. Coronary artery calcification is noted. The esophagus has normal imaging features. Stable 9 mm left thyroid nodule left Port-A-Cath remains in place with tip positioned at the distal SVC level. Probable retained cuff from previous tunneled central line in the left anterior chest wall. Lungs/Pleura: Probable anterior subpleural radiation fibrosis right lung. The basilar chronic atelectasis or scarring in the lower lobes is stable. No suspicious pulmonary nodule or mass. No focal airspace consolidation. No pleural effusion. Musculoskeletal: Bone windows reveal no worrisome lytic or sclerotic osseous lesions. CT ABDOMEN PELVIS FINDINGS Hepatobiliary: No focal abnormality within the liver parenchyma. Small calcified stones are seen in the gallbladder. No intrahepatic or extrahepatic biliary dilation. Pancreas: No focal mass lesion. No dilatation of the main duct. No intraparenchymal cyst. No peripancreatic edema. Spleen: No splenomegaly. No focal mass lesion. Adrenals/Urinary Tract: No adrenal nodule or mass. Scattered areas of mild cortical scarring are noted in the kidneys bilaterally. No enhancing renal mass lesion. No hydronephrosis. No evidence for hydroureter. The distal ureters and posterior bladder are obscured from streak artifact due to bilateral hip replacement. Stomach/Bowel: Small hiatal hernia noted. Stomach otherwise unremarkable. Duodenum is normally positioned as is the ligament of Treitz. No small bowel wall thickening. No small bowel dilatation. Portions of the pelvic small bowel are obscured by streak artifact. The terminal ileum is normal. The  appendix is normal. No gross colonic mass. No colonic wall thickening. No substantial diverticular change. Vascular/Lymphatic: There is abdominal aortic atherosclerosis without aneurysm. There is no gastrohepatic or hepatoduodenal  ligament lymphadenopathy. No intraperitoneal or retroperitoneal lymphadenopathy. No pelvic sidewall lymphadenopathy. Reproductive: Uterus is surgically absent. There is no adnexal mass. Other: No intraperitoneal free fluid. Musculoskeletal: Patient is status post bilateral hip replacement Bone windows reveal no worrisome lytic or sclerotic osseous lesions. IMPRESSION: 1. Slight interval increase in a borderline enlarged right infrahilar lymph node. While this is indeterminate, close follow-up is recommended as metastatic disease cannot be entirely excluded. 2. Otherwise stable exam. 3. Coronary artery atherosclerosis. 4. Cholelithiasis 5. Interval right hip replacement with bilateral hip replacement no evident. Electronically Signed   By: Misty Stanley M.D.   On: 12/29/2015 13:02    Assessment & Plan:   There are no diagnoses linked to this encounter. I am having Ms. Lofland maintain her Vitamin D3, meclizine, docusate sodium, polyethylene glycol, tiZANidine, promethazine, losartan-hydrochlorothiazide, ALPRAZolam, acetaminophen-codeine, anastrozole, rivaroxaban, triamcinolone ointment, amLODipine, pantoprazole, and spironolactone.  No orders of the defined types were placed in this encounter.    Follow-up: No Follow-up on file.  Walker Kehr, MD

## 2016-08-26 NOTE — Progress Notes (Signed)
Pre visit review using our clinic review tool, if applicable. No additional management support is needed unless otherwise documented below in the visit note. 

## 2016-08-26 NOTE — Assessment & Plan Note (Signed)
EKG X ray T#2 prn

## 2016-08-26 NOTE — Assessment & Plan Note (Signed)
LS Xray UA T#2 prn

## 2016-08-26 NOTE — Assessment & Plan Note (Signed)
X rays L shoulder, LS Spine due to pain

## 2016-08-26 NOTE — Assessment & Plan Note (Signed)
Multiple sites T#2 prn  Potential benefits of a long term opioids use as well as potential risks (i.e. addiction risk, apnea etc) and complications (i.e. Somnolence, constipation and others) were explained to the patient and were aknowledged.

## 2016-08-26 NOTE — Assessment & Plan Note (Signed)
Hyzaar, Amlodipine

## 2016-08-31 ENCOUNTER — Ambulatory Visit (HOSPITAL_BASED_OUTPATIENT_CLINIC_OR_DEPARTMENT_OTHER): Payer: Medicare Other

## 2016-08-31 DIAGNOSIS — C7951 Secondary malignant neoplasm of bone: Secondary | ICD-10-CM

## 2016-08-31 DIAGNOSIS — Z452 Encounter for adjustment and management of vascular access device: Secondary | ICD-10-CM

## 2016-08-31 DIAGNOSIS — Z95828 Presence of other vascular implants and grafts: Secondary | ICD-10-CM

## 2016-08-31 DIAGNOSIS — C50911 Malignant neoplasm of unspecified site of right female breast: Secondary | ICD-10-CM | POA: Diagnosis not present

## 2016-08-31 MED ORDER — HEPARIN SOD (PORK) LOCK FLUSH 100 UNIT/ML IV SOLN
500.0000 [IU] | Freq: Once | INTRAVENOUS | Status: AC | PRN
  Administered 2016-08-31: 500 [IU] via INTRAVENOUS
  Filled 2016-08-31: qty 5

## 2016-08-31 MED ORDER — SODIUM CHLORIDE 0.9 % IJ SOLN
10.0000 mL | INTRAMUSCULAR | Status: DC | PRN
  Administered 2016-08-31: 10 mL via INTRAVENOUS
  Filled 2016-08-31: qty 10

## 2016-09-03 NOTE — Addendum Note (Signed)
Addended by: Cresenciano Lick on: 09/03/2016 08:26 AM   Modules accepted: Orders

## 2016-09-16 ENCOUNTER — Ambulatory Visit (INDEPENDENT_AMBULATORY_CARE_PROVIDER_SITE_OTHER)
Admission: RE | Admit: 2016-09-16 | Discharge: 2016-09-16 | Disposition: A | Discharge status: Home / Self Care | Payer: Medicare Other | Source: Ambulatory Visit | Attending: Internal Medicine | Admitting: Internal Medicine

## 2016-09-16 DIAGNOSIS — M545 Low back pain, unspecified: Secondary | ICD-10-CM

## 2016-09-16 DIAGNOSIS — G8929 Other chronic pain: Secondary | ICD-10-CM

## 2016-09-27 ENCOUNTER — Ambulatory Visit: Payer: Medicare Other | Admitting: Internal Medicine

## 2016-10-11 ENCOUNTER — Ambulatory Visit (INDEPENDENT_AMBULATORY_CARE_PROVIDER_SITE_OTHER): Payer: Medicare Other | Admitting: Internal Medicine

## 2016-10-11 ENCOUNTER — Encounter: Payer: Self-pay | Admitting: Internal Medicine

## 2016-10-11 DIAGNOSIS — C50911 Malignant neoplasm of unspecified site of right female breast: Secondary | ICD-10-CM

## 2016-10-11 DIAGNOSIS — C7951 Secondary malignant neoplasm of bone: Secondary | ICD-10-CM

## 2016-10-11 DIAGNOSIS — M545 Low back pain: Secondary | ICD-10-CM

## 2016-10-11 DIAGNOSIS — I10 Essential (primary) hypertension: Secondary | ICD-10-CM | POA: Diagnosis not present

## 2016-10-11 DIAGNOSIS — R0609 Other forms of dyspnea: Secondary | ICD-10-CM | POA: Diagnosis not present

## 2016-10-11 DIAGNOSIS — G8929 Other chronic pain: Secondary | ICD-10-CM

## 2016-10-11 NOTE — Assessment & Plan Note (Signed)
MRI L-S spine

## 2016-10-11 NOTE — Assessment & Plan Note (Signed)
Losartan, Amlodipine

## 2016-10-11 NOTE — Assessment & Plan Note (Signed)
Wt Readings from Last 3 Encounters:  10/11/16 235 lb (106.6 kg)  08/26/16 236 lb (107 kg)  07/13/16 237 lb (107.5 kg)

## 2016-10-11 NOTE — Progress Notes (Signed)
Pre visit review using our clinic review tool, if applicable. No additional management support is needed unless otherwise documented below in the visit note. 

## 2016-10-11 NOTE — Assessment & Plan Note (Signed)
Loose wt Pt declined CXR

## 2016-10-11 NOTE — Assessment & Plan Note (Signed)
Worse Will sch an MRI Cont w/T#2

## 2016-10-11 NOTE — Progress Notes (Signed)
Subjective:  Patient ID: [name] [name], female    DOB: June 11, 1941  Age: 75 y.o. MRN: [id number]  CC: No chief complaint on file.   HPI [name] [name] presents for severe LBP - worse 9-10/10. Shoulder pain is better. C/o SOB, OA pains  Outpatient Medications Prior to Visit  Medication Sig Dispense Refill  . acetaminophen-codeine (TYLENOL #2) 300-15 MG tablet Take 1 tablet by mouth every 6 (six) hours as needed for severe pain. 90 tablet 3  . ALPRAZolam (XANAX) 0.5 MG tablet Take 1 tablet (0.5 mg total) by mouth 2 (two) times daily as needed for anxiety or sleep. 60 tablet 2  . amLODipine (NORVASC) 10 MG tablet Take 0.5 tablets (5 mg total) by mouth daily. 90 tablet 3  . anastrozole (ARIMIDEX) 1 MG tablet Take 1 tablet (1 mg total) by mouth daily. 90 tablet 2  . Cholecalciferol (VITAMIN D3) 1000 UNITS CAPS Take 1 capsule by mouth daily.     Marland Kitchen docusate sodium (COLACE) 100 MG capsule Take 1 capsule (100 mg total) by mouth 2 (two) times daily. 10 capsule 0  . losartan-hydrochlorothiazide (HYZAAR) 100-25 MG tablet Take 1 tablet by mouth daily. 90 tablet 3  . meclizine (ANTIVERT) 12.5 MG tablet Take 1 tablet (12.5 mg total) by mouth 2 (two) times daily as needed for dizziness. 10 tablet 0  . pantoprazole (PROTONIX) 40 MG tablet Take 1 tablet (40 mg total) by mouth daily. 90 tablet 3  . polyethylene glycol (MIRALAX / GLYCOLAX) packet Take 17 g by mouth 2 (two) times daily. 14 each 0  . promethazine (PHENERGAN) 12.5 MG tablet Take 1 tablet (12.5 mg total) by mouth every 6 (six) hours as needed for nausea or vomiting. 30 tablet 0  . rivaroxaban (XARELTO) 20 MG TABS tablet Take 1 tablet (20 mg total) by mouth daily. 30 tablet 11  . spironolactone (ALDACTONE) 50 MG tablet Take 1 tablet (50 mg total) by mouth daily. 90 tablet 3  . tiZANidine (ZANAFLEX) 4 MG tablet Take 1 tablet (4 mg total) by mouth every 6 (six) hours as needed for muscle spasms. 40 tablet 0  . triamcinolone ointment (KENALOG)  0.5 % Apply 1 application topically 3 (three) times daily. 30 g 2   No facility-administered medications prior to visit.     ROS Review of Systems  Constitutional: Negative for activity change, appetite change, chills, fatigue and unexpected weight change.  HENT: Negative for congestion, mouth sores and sinus pressure.   Eyes: Negative for visual disturbance.  Respiratory: Positive for shortness of breath. Negative for cough and chest tightness.   Gastrointestinal: Negative for abdominal pain and nausea.  Genitourinary: Negative for difficulty urinating, frequency and vaginal pain.  Musculoskeletal: Positive for back pain and gait problem.  Skin: Negative for pallor and rash.  Neurological: Negative for dizziness, tremors, weakness, numbness and headaches.  Psychiatric/Behavioral: Negative for confusion and sleep disturbance. The patient is not nervous/anxious.     Objective:  BP 130/60   Pulse 83   Wt 235 lb (106.6 kg)   SpO2 95%   BMI 41.63 kg/m   BP Readings from Last 3 Encounters:  10/11/16 130/60  08/26/16 140/64  07/13/16 (!) 168/72    Wt Readings from Last 3 Encounters:  10/11/16 235 lb (106.6 kg)  08/26/16 236 lb (107 kg)  07/13/16 237 lb (107.5 kg)    Physical Exam  Constitutional: She appears well-developed. No distress.  HENT:  Head: Normocephalic.  Right Ear: External ear normal.  Left  Ear: External ear normal.  Nose: Nose normal.  Mouth/Throat: Oropharynx is clear and moist.  Eyes: Conjunctivae are normal. Pupils are equal, round, and reactive to light. Right eye exhibits no discharge. Left eye exhibits no discharge.  Neck: Normal range of motion. Neck supple. No JVD present. No tracheal deviation present. No thyromegaly present.  Cardiovascular: Normal rate, regular rhythm and normal heart sounds.   Pulmonary/Chest: No stridor. No respiratory distress. She has no wheezes.  Abdominal: Soft. Bowel sounds are normal. She exhibits no distension and no  mass. There is no tenderness. There is no rebound and no guarding.  Musculoskeletal: She exhibits tenderness. She exhibits no edema.  Lymphadenopathy:    She has no cervical adenopathy.  Neurological: She displays normal reflexes. No cranial nerve deficit. She exhibits normal muscle tone. Coordination normal.  Skin: No rash noted. No erythema.  Psychiatric: She has a normal mood and affect. Her behavior is normal. Judgment and thought content normal.  LS tender Cane Obese     Dg Lumbar Spine 2-3 Views  Result Date: 09/16/2016 CLINICAL DATA:  76 y/o F; 3-4 months of lower back pain. History of breast cancer with metastasis. EXAM: LUMBAR SPINE - 2-3 VIEW COMPARISON:  12/29/2015 CT abdomen and pelvis. FINDINGS: Mild S-shaped curvature of the lumbar spine. Lumbar lordosis is maintained. L4-5 grade 1 anterolisthesis. Vertebral body heights are preserved. Moderate disc space narrowing at L3-4 and L5-S1, mild disc space narrowing at L4-5. Extensive lower lumbar facet arthrosis. No acute fracture identified. No significant interval change. IMPRESSION: Moderate lumbar spondylosis with extensive facet arthropathy and L4-5 grade 1 degenerative anterolisthesis stable in comparison with prior CT given differences in technique. No acute osseous abnormality identified. Electronically Signed   By: Kristine Garbe M.D.   On: 09/16/2016 15:17    Assessment & Plan:   There are no diagnoses linked to this encounter. I am having Ms. Dacanay maintain her Vitamin D3, meclizine, docusate sodium, polyethylene glycol, tiZANidine, promethazine, losartan-hydrochlorothiazide, ALPRAZolam, anastrozole, rivaroxaban, triamcinolone ointment, amLODipine, pantoprazole, spironolactone, and acetaminophen-codeine.  No orders of the defined types were placed in this encounter.    Follow-up: No Follow-up on file.  Walker Kehr, MD

## 2016-10-13 ENCOUNTER — Ambulatory Visit: Payer: Medicare Other

## 2016-10-13 ENCOUNTER — Telehealth: Payer: Self-pay | Admitting: Internal Medicine

## 2016-10-13 ENCOUNTER — Ambulatory Visit (HOSPITAL_BASED_OUTPATIENT_CLINIC_OR_DEPARTMENT_OTHER): Payer: Medicare Other | Admitting: Internal Medicine

## 2016-10-13 ENCOUNTER — Encounter: Payer: Self-pay | Admitting: Internal Medicine

## 2016-10-13 ENCOUNTER — Other Ambulatory Visit (HOSPITAL_BASED_OUTPATIENT_CLINIC_OR_DEPARTMENT_OTHER): Payer: Medicare Other

## 2016-10-13 VITALS — BP 141/75 | HR 87 | Temp 97.7°F | Resp 18 | Ht 63.0 in | Wt 235.4 lb

## 2016-10-13 DIAGNOSIS — Z95828 Presence of other vascular implants and grafts: Secondary | ICD-10-CM

## 2016-10-13 DIAGNOSIS — I10 Essential (primary) hypertension: Secondary | ICD-10-CM

## 2016-10-13 DIAGNOSIS — C7951 Secondary malignant neoplasm of bone: Secondary | ICD-10-CM | POA: Diagnosis not present

## 2016-10-13 DIAGNOSIS — C50911 Malignant neoplasm of unspecified site of right female breast: Secondary | ICD-10-CM | POA: Diagnosis not present

## 2016-10-13 DIAGNOSIS — M545 Low back pain: Secondary | ICD-10-CM | POA: Diagnosis not present

## 2016-10-13 DIAGNOSIS — G8929 Other chronic pain: Secondary | ICD-10-CM

## 2016-10-13 LAB — CBC WITH DIFFERENTIAL/PLATELET
BASO%: 0.6 % (ref 0.0–2.0)
Basophils Absolute: 0 10*3/uL (ref 0.0–0.1)
EOS%: 1 % (ref 0.0–7.0)
Eosinophils Absolute: 0.1 10*3/uL (ref 0.0–0.5)
HCT: 37 % (ref 34.8–46.6)
HGB: 11.7 g/dL (ref 11.6–15.9)
LYMPH%: 14.7 % (ref 14.0–49.7)
MCH: 26.5 pg (ref 25.1–34.0)
MCHC: 31.7 g/dL (ref 31.5–36.0)
MCV: 83.7 fL (ref 79.5–101.0)
MONO#: 0.8 10*3/uL (ref 0.1–0.9)
MONO%: 12 % (ref 0.0–14.0)
NEUT%: 71.7 % (ref 38.4–76.8)
NEUTROS ABS: 4.7 10*3/uL (ref 1.5–6.5)
Platelets: 309 10*3/uL (ref 145–400)
RBC: 4.42 10*6/uL (ref 3.70–5.45)
RDW: 17 % — ABNORMAL HIGH (ref 11.2–14.5)
WBC: 6.5 10*3/uL (ref 3.9–10.3)
lymph#: 1 10*3/uL (ref 0.9–3.3)

## 2016-10-13 LAB — COMPREHENSIVE METABOLIC PANEL
ALT: 14 U/L (ref 0–55)
ANION GAP: 13 meq/L — AB (ref 3–11)
AST: 27 U/L (ref 5–34)
Albumin: 3.1 g/dL — ABNORMAL LOW (ref 3.5–5.0)
Alkaline Phosphatase: 113 U/L (ref 40–150)
BILIRUBIN TOTAL: 0.39 mg/dL (ref 0.20–1.20)
BUN: 13.3 mg/dL (ref 7.0–26.0)
CHLORIDE: 105 meq/L (ref 98–109)
CO2: 22 meq/L (ref 22–29)
Calcium: 9.5 mg/dL (ref 8.4–10.4)
Creatinine: 1.3 mg/dL — ABNORMAL HIGH (ref 0.6–1.1)
EGFR: 46 mL/min/{1.73_m2} — AB (ref >90)
GLUCOSE: 109 mg/dL (ref 70–140)
POTASSIUM: 3.4 meq/L — AB (ref 3.5–5.1)
SODIUM: 140 meq/L (ref 136–145)
Total Protein: 7.9 g/dL (ref 6.4–8.3)

## 2016-10-13 MED ORDER — SODIUM CHLORIDE 0.9 % IJ SOLN
10.0000 mL | INTRAMUSCULAR | Status: DC | PRN
  Administered 2016-10-13: 10 mL via INTRAVENOUS
  Filled 2016-10-13: qty 10

## 2016-10-13 MED ORDER — HEPARIN SOD (PORK) LOCK FLUSH 100 UNIT/ML IV SOLN
500.0000 [IU] | Freq: Once | INTRAVENOUS | Status: AC | PRN
  Administered 2016-10-13: 500 [IU] via INTRAVENOUS
  Filled 2016-10-13: qty 5

## 2016-10-13 NOTE — Telephone Encounter (Signed)
Appointments scheduled per 10/13/16 los. A copy of the AVS report and appointment schedule was given to the patient per 10/12/16 los. °

## 2016-10-13 NOTE — Progress Notes (Signed)
[place] [phone]   [phone]  OFFICE PROGRESS NOTE  Pcp Not In System No address on file  DIAGNOSIS: Metastatic breast adenocarcinoma presented with bone metastasis in the left hip area this was initially diagnosed as a stage II (T2, N1, M0) invasive right breast carcinoma with positive estrogen and progesterone receptor as well as HER-2/neu in July 2002  PRIOR THERAPY: 1. Status post right lumpectomy with right axillary lymph node dissection revealing 4/9 lymph nodes that were positive for malignancy.  2. Status post 8 cycles of systemic chemotherapy with CMF, completed March 2003.  3. Status post radiotherapy to the remaining right breast under the care of Dr. Tammi Klippel.  4. Status post 2 years of treatment with tamoxifen, started July 2003, then switched to Femara by Dr. Humphrey Rolls in August 2005. The patient discontinued the treatment in February 2009.  5. Status post left total hip replacement on Apr 01, 2011, secondary to pathologic left femoral neck fracture. 6. Zometa 4 mg IV on 3 monthly basis. Discontinued secondary to renal insufficiency.  CURRENT THERAPY: 1. Arimidex 1 mg by mouth daily started 03/18/2011 2. Xgeva 120 g subcutaneously every 3 months.  INTERVAL HISTORY: [name] [name] 75 y.o. female returns to the clinic today for three-month follow-up visit. The patient is feeling fine today except for low back pain more on the left side. She had x-ray of the lumbar spine performed recently that showed osteoarthritis. She denied having any recent weight loss or night sweats. She has no chest pain, shortness breath, cough or hemoptysis. She denied having any fever or chills. She has no nausea or vomiting. She is currently on treatment with Arimidex and Xgeva for the metastatic bone disease. She is here today for evaluation with repeat blood work.  MEDICAL HISTORY: Past Medical History:  Diagnosis Date  . Anxiety    hx of  . Asthma     . Breast cancer ([place])    2002; metastatic in 2012, right breast, spread to left hip in 2012  . Cerebrovascular accident [facility])    R thalamic CVA 01/2010  . Dyslipidemia   . Hyperlipemia   . Hypertension   . Low back pain   . Osteoarthritis   . TIA (transient ischemic attack)    x2 in 2011  . Vertigo 2014    ALLERGIES:  has No Known Allergies.  MEDICATIONS:  Current Outpatient Prescriptions  Medication Sig Dispense Refill  . acetaminophen-codeine (TYLENOL #2) 300-15 MG tablet Take 1 tablet by mouth every 6 (six) hours as needed for severe pain. 90 tablet 3  . ALPRAZolam (XANAX) 0.5 MG tablet Take 1 tablet (0.5 mg total) by mouth 2 (two) times daily as needed for anxiety or sleep. 60 tablet 2  . amLODipine (NORVASC) 10 MG tablet Take 0.5 tablets (5 mg total) by mouth daily. 90 tablet 3  . anastrozole (ARIMIDEX) 1 MG tablet Take 1 tablet (1 mg total) by mouth daily. 90 tablet 2  . Cholecalciferol (VITAMIN D3) 1000 UNITS CAPS Take 1 capsule by mouth daily.     Marland Kitchen docusate sodium (COLACE) 100 MG capsule Take 1 capsule (100 mg total) by mouth 2 (two) times daily. 10 capsule 0  . losartan-hydrochlorothiazide (HYZAAR) 100-25 MG tablet Take 1 tablet by mouth daily. 90 tablet 3  . meclizine (ANTIVERT) 12.5 MG tablet Take 1 tablet (12.5 mg total) by mouth 2 (two) times daily as needed for dizziness. 10 tablet 0  . pantoprazole (PROTONIX) 40  MG tablet Take 1 tablet (40 mg total) by mouth daily. 90 tablet 3  . polyethylene glycol (MIRALAX / GLYCOLAX) packet Take 17 g by mouth 2 (two) times daily. 14 each 0  . promethazine (PHENERGAN) 12.5 MG tablet Take 1 tablet (12.5 mg total) by mouth every 6 (six) hours as needed for nausea or vomiting. 30 tablet 0  . rivaroxaban (XARELTO) 20 MG TABS tablet Take 1 tablet (20 mg total) by mouth daily. 30 tablet 11  . spironolactone (ALDACTONE) 50 MG tablet Take 1 tablet (50 mg total) by mouth daily. 90 tablet 3  . tiZANidine (ZANAFLEX) 4 MG tablet Take 1 tablet  (4 mg total) by mouth every 6 (six) hours as needed for muscle spasms. 40 tablet 0  . triamcinolone ointment (KENALOG) 0.5 % Apply 1 application topically 3 (three) times daily. 30 g 2   No current facility-administered medications for this visit.     SURGICAL HISTORY:  Past Surgical History:  Procedure Laterality Date  . ABDOMINAL HYSTERECTOMY     complete  . BREAST LUMPECTOMY     Right breast 2002  . JOINT REPLACEMENT  2012   L hip due to breast ca met  . TOTAL HIP ARTHROPLASTY Right 10/28/2015   Procedure: RIGHT TOTAL HIP ARTHROPLASTY ANTERIOR APPROACH;  Surgeon: Paralee Cancel, MD;  Location: [facility];  Service: Orthopedics;  Laterality: Right;    REVIEW OF SYSTEMS:  A comprehensive review of systems was negative except for: Constitutional: positive for fatigue Musculoskeletal: positive for back pain   PHYSICAL EXAMINATION: General appearance: alert, cooperative, fatigued and no distress Head: Normocephalic, without obvious abnormality, atraumatic Neck: no adenopathy, no JVD, supple, symmetrical, trachea midline and thyroid not enlarged, symmetric, no tenderness/mass/nodules Lymph nodes: Cervical, supraclavicular, and axillary nodes normal. Resp: clear to auscultation bilaterally Back: symmetric, no curvature. ROM normal. No CVA tenderness. Cardio: regular rate and rhythm, S1, S2 normal, no murmur, click, rub or gallop GI: soft, non-tender; bowel sounds normal; no masses,  no organomegaly Extremities: extremities normal, atraumatic, no cyanosis or edema  ECOG PERFORMANCE STATUS: 1 - Symptomatic but completely ambulatory  Blood pressure (!) 141/75, pulse 87, temperature 97.7 F (36.5 C), temperature source Oral, resp. rate 18, height _0  (1.6 m), weight 235 lb 6.4 oz (106.8 kg), SpO2 98 %.  LABORATORY DATA: Lab Results  Component Value Date   WBC 6.5 10/13/2016   HGB 11.7 10/13/2016   HCT 37.0 10/13/2016   MCV 83.7 10/13/2016   PLT 309 10/13/2016      Chemistry        Component Value Date/Time   NA 140 10/13/2016 0918   K 3.4 (L) 10/13/2016 0918   CL 105 03/02/2016 0834   CL 109 (H) 04/25/2013 1023   CO2 22 10/13/2016 0918   BUN 13.3 10/13/2016 0918   CREATININE 1.3 (H) 10/13/2016 0918      Component Value Date/Time   CALCIUM 9.5 10/13/2016 0918   ALKPHOS 113 10/13/2016 0918   AST 27 10/13/2016 0918   ALT 14 10/13/2016 0918   BILITOT 0.39 10/13/2016 0918       RADIOGRAPHIC STUDIES: Dg Lumbar Spine 2-3 Views  Result Date: 09/16/2016 CLINICAL DATA:  75 y/o F; 3-4 months of lower back pain. History of breast cancer with metastasis. EXAM: LUMBAR SPINE - 2-3 VIEW COMPARISON:  12/29/2015 CT abdomen and pelvis. FINDINGS: Mild S-shaped curvature of the lumbar spine. Lumbar lordosis is maintained. L4-5 grade 1 anterolisthesis. Vertebral body heights are preserved. Moderate disc space narrowing at  L3-4 and L5-S1, mild disc space narrowing at L4-5. Extensive lower lumbar facet arthrosis. No acute fracture identified. No significant interval change. IMPRESSION: Moderate lumbar spondylosis with extensive facet arthropathy and L4-5 grade 1 degenerative anterolisthesis stable in comparison with prior CT given differences in technique. No acute osseous abnormality identified. Electronically Signed   By: Kristine Garbe M.D.   On: 09/16/2016 15:17    ASSESSMENT AND PLAN: This very pleasant 75 years old African-American female with metastatic breast cancer with bone metastasis. She is currently on treatment with Arimidex and Xgeva for the metastatic bone disease. She is tolerating her treatment well with no concerning adverse effects. I recommended for the patient to continue her current treatment with Arimidex for now. She will also receive Xgeva injection today. I will see her back for follow-up visit in 3 months with repeat CT scan of the chest, abdomen and pelvis as well as bone scan for restaging of her disease. She was advised to call immediately if  she has any concerning symptoms in the interval. The patient voices understanding of current disease status and treatment options and is in agreement with the current care plan.  All questions were answered. The patient knows to call the clinic with any problems, questions or concerns. We can certainly see the patient much sooner if necessary.  I spent 10 minutes counseling the patient face to face. The total time spent in the appointment was 15 minutes.  Disclaimer: This note was dictated with voice recognition software. Similar sounding words can inadvertently be transcribed and may not be corrected upon review.

## 2016-10-14 ENCOUNTER — Ambulatory Visit: Payer: Medicare Other

## 2016-10-14 DIAGNOSIS — Z95828 Presence of other vascular implants and grafts: Secondary | ICD-10-CM

## 2016-10-14 DIAGNOSIS — C50911 Malignant neoplasm of unspecified site of right female breast: Secondary | ICD-10-CM

## 2016-10-14 DIAGNOSIS — C7951 Secondary malignant neoplasm of bone: Secondary | ICD-10-CM

## 2016-10-14 LAB — CANCER ANTIGEN 27.29: CA 27.29: 64.6 U/mL — ABNORMAL HIGH (ref 0.0–38.6)

## 2016-10-14 MED ORDER — ALTEPLASE 2 MG IJ SOLR
2.0000 mg | Freq: Once | INTRAMUSCULAR | Status: DC | PRN
  Filled 2016-10-14: qty 2

## 2016-10-14 MED ORDER — DENOSUMAB 120 MG/1.7ML ~~LOC~~ SOLN
120.0000 mg | Freq: Once | SUBCUTANEOUS | Status: DC
  Filled 2016-10-14: qty 1.7

## 2016-10-14 NOTE — Patient Instructions (Signed)
Denosumab injection What is this medicine? DENOSUMAB (den oh sue mab) slows bone breakdown. Prolia is used to treat osteoporosis in women after menopause and in men. Xgeva is used to prevent bone fractures and other bone problems caused by cancer bone metastases. Xgeva is also used to treat giant cell tumor of the bone. This medicine may be used for other purposes; ask your health care provider or pharmacist if you have questions. COMMON BRAND NAME(S): Prolia, XGEVA What should I tell my health care provider before I take this medicine? They need to know if you have any of these conditions: -dental disease -eczema -infection or history of infections -kidney disease or on dialysis -low blood calcium or vitamin D -malabsorption syndrome -scheduled to have surgery or tooth extraction -taking medicine that contains denosumab -thyroid or parathyroid disease -an unusual reaction to denosumab, other medicines, foods, dyes, or preservatives -pregnant or trying to get pregnant -breast-feeding How should I use this medicine? This medicine is for injection under the skin. It is given by a health care professional in a hospital or clinic setting. If you are getting Prolia, a special MedGuide will be given to you by the pharmacist with each prescription and refill. Be sure to read this information carefully each time. For Prolia, talk to your pediatrician regarding the use of this medicine in children. Special care may be needed. For Xgeva, talk to your pediatrician regarding the use of this medicine in children. While this drug may be prescribed for children as young as 13 years for selected conditions, precautions do apply. Overdosage: If you think you've taken too much of this medicine contact a poison control center or emergency room at once. Overdosage: If you think you have taken too much of this medicine contact a poison control center or emergency room at once. NOTE: This medicine is only for  you. Do not share this medicine with others. What if I miss a dose? It is important not to miss your dose. Call your doctor or health care professional if you are unable to keep an appointment. What may interact with this medicine? Do not take this medicine with any of the following medications: -other medicines containing denosumab This medicine may also interact with the following medications: -medicines that suppress the immune system -medicines that treat cancer -steroid medicines like prednisone or cortisone This list may not describe all possible interactions. Give your health care provider a list of all the medicines, herbs, non-prescription drugs, or dietary supplements you use. Also tell them if you smoke, drink alcohol, or use illegal drugs. Some items may interact with your medicine. What should I watch for while using this medicine? Visit your doctor or health care professional for regular checks on your progress. Your doctor or health care professional may order blood tests and other tests to see how you are doing. Call your doctor or health care professional if you get a cold or other infection while receiving this medicine. Do not treat yourself. This medicine may decrease your body's ability to fight infection. You should make sure you get enough calcium and vitamin D while you are taking this medicine, unless your doctor tells you not to. Discuss the foods you eat and the vitamins you take with your health care professional. See your dentist regularly. Brush and floss your teeth as directed. Before you have any dental work done, tell your dentist you are receiving this medicine. Do not become pregnant while taking this medicine or for 5 months after stopping   it. Women should inform their doctor if they wish to become pregnant or think they might be pregnant. There is a potential for serious side effects to an unborn child. Talk to your health care professional or pharmacist for more  information. What side effects may I notice from receiving this medicine? Side effects that you should report to your doctor or health care professional as soon as possible: -allergic reactions like skin rash, itching or hives, swelling of the face, lips, or tongue -breathing problems -chest pain -fast, irregular heartbeat -feeling faint or lightheaded, falls -fever, chills, or any other sign of infection -muscle spasms, tightening, or twitches -numbness or tingling -skin blisters or bumps, or is dry, peels, or red -slow healing or unexplained pain in the mouth or jaw -unusual bleeding or bruising Side effects that usually do not require medical attention (Report these to your doctor or health care professional if they continue or are bothersome.): -muscle pain -stomach upset, gas This list may not describe all possible side effects. Call your doctor for medical advice about side effects. You may report side effects to FDA at 1-800-FDA-1088. Where should I keep my medicine? This medicine is only given in a clinic, doctor's office, or other health care setting and will not be stored at home. NOTE: This sheet is a summary. It may not cover all possible information. If you have questions about this medicine, talk to your doctor, pharmacist, or health care provider.  2015, [employer] Standard. (2012-04-17 12:37:47)  

## 2016-10-22 ENCOUNTER — Ambulatory Visit (HOSPITAL_COMMUNITY): Payer: Medicare Other

## 2016-10-30 ENCOUNTER — Ambulatory Visit (HOSPITAL_COMMUNITY)
Admission: RE | Admit: 2016-10-30 | Discharge: 2016-10-30 | Disposition: A | Discharge status: Home / Self Care | Payer: Medicare Other | Source: Ambulatory Visit | Attending: Internal Medicine | Admitting: Internal Medicine

## 2016-10-30 DIAGNOSIS — M545 Low back pain: Principal | ICD-10-CM

## 2016-10-30 DIAGNOSIS — G8929 Other chronic pain: Secondary | ICD-10-CM

## 2016-10-30 MED ORDER — GADOBENATE DIMEGLUMINE 529 MG/ML IV SOLN: 20.0000 mL | Freq: Once | INTRAVENOUS | Status: DC | PRN

## 2016-11-12 ENCOUNTER — Ambulatory Visit (INDEPENDENT_AMBULATORY_CARE_PROVIDER_SITE_OTHER): Payer: Medicare Other | Admitting: Internal Medicine

## 2016-11-12 ENCOUNTER — Ambulatory Visit: Admission: RE | Admit: 2016-11-12 | Payer: Medicare Other | Source: Ambulatory Visit

## 2016-11-12 ENCOUNTER — Encounter: Payer: Self-pay | Admitting: Internal Medicine

## 2016-11-12 DIAGNOSIS — R0609 Other forms of dyspnea: Secondary | ICD-10-CM

## 2016-11-12 DIAGNOSIS — M545 Low back pain, unspecified: Secondary | ICD-10-CM

## 2016-11-12 DIAGNOSIS — G8929 Other chronic pain: Secondary | ICD-10-CM | POA: Diagnosis not present

## 2016-11-12 DIAGNOSIS — R197 Diarrhea, unspecified: Secondary | ICD-10-CM | POA: Diagnosis not present

## 2016-11-12 MED ORDER — CEFDINIR 300 MG PO CAPS: 300.0000 mg | ORAL_CAPSULE | Freq: Two times a day (BID) | ORAL | 0 refills | Status: DC

## 2016-11-12 MED ORDER — DIPHENOXYLATE-ATROPINE 2.5-0.025 MG PO TABS: 1.0000 | ORAL_TABLET | Freq: Four times a day (QID) | ORAL | 1 refills | Status: DC | PRN

## 2016-11-12 MED ORDER — PREDNISONE 10 MG PO TABS: ORAL_TABLET | ORAL | 1 refills | Status: DC

## 2016-11-12 NOTE — Assessment & Plan Note (Signed)
Better MRI is delayed due to her URI T#2

## 2016-11-12 NOTE — Progress Notes (Signed)
Subjective:  Patient ID: [name] [name], female    DOB: 1941-05-03  Age: 76 y.o. MRN: [id number]  CC: No chief complaint on file.   HPI [name] [name] presents for LBP f/u - not better. She could not finish an MRI due to a new UR_ SOB, cough, ST x 2 weeks C/o new diarrhea x 2 d  Outpatient Medications Prior to Visit  Medication Sig Dispense Refill  . acetaminophen-codeine (TYLENOL #2) 300-15 MG tablet Take 1 tablet by mouth every 6 (six) hours as needed for severe pain. 90 tablet 3  . ALPRAZolam (XANAX) 0.5 MG tablet Take 1 tablet (0.5 mg total) by mouth 2 (two) times daily as needed for anxiety or sleep. 60 tablet 2  . amLODipine (NORVASC) 10 MG tablet Take 0.5 tablets (5 mg total) by mouth daily. 90 tablet 3  . anastrozole (ARIMIDEX) 1 MG tablet Take 1 tablet (1 mg total) by mouth daily. 90 tablet 2  . Cholecalciferol (VITAMIN D3) 1000 UNITS CAPS Take 1 capsule by mouth daily.     Marland Kitchen docusate sodium (COLACE) 100 MG capsule Take 1 capsule (100 mg total) by mouth 2 (two) times daily. 10 capsule 0  . losartan-hydrochlorothiazide (HYZAAR) 100-25 MG tablet Take 1 tablet by mouth daily. 90 tablet 3  . meclizine (ANTIVERT) 12.5 MG tablet Take 1 tablet (12.5 mg total) by mouth 2 (two) times daily as needed for dizziness. 10 tablet 0  . pantoprazole (PROTONIX) 40 MG tablet Take 1 tablet (40 mg total) by mouth daily. 90 tablet 3  . polyethylene glycol (MIRALAX / GLYCOLAX) packet Take 17 g by mouth 2 (two) times daily. 14 each 0  . promethazine (PHENERGAN) 12.5 MG tablet Take 1 tablet (12.5 mg total) by mouth every 6 (six) hours as needed for nausea or vomiting. 30 tablet 0  . rivaroxaban (XARELTO) 20 MG TABS tablet Take 1 tablet (20 mg total) by mouth daily. 30 tablet 11  . spironolactone (ALDACTONE) 50 MG tablet Take 1 tablet (50 mg total) by mouth daily. 90 tablet 3  . tiZANidine (ZANAFLEX) 4 MG tablet Take 1 tablet (4 mg total) by mouth every 6 (six) hours as needed for muscle spasms. 40  tablet 0  . triamcinolone ointment (KENALOG) 0.5 % Apply 1 application topically 3 (three) times daily. 30 g 2   Facility-Administered Medications Prior to Visit  Medication Dose Route Frequency Provider Last Rate Last Dose  . alteplase (CATHFLO ACTIVASE) injection 2 mg  2 mg Intracatheter Once PRN Curt Bears, MD      . denosumab (XGEVA) injection 120 mg  120 mg Subcutaneous Once Curt Bears, MD        ROS Review of Systems  Constitutional: Positive for chills and fatigue. Negative for activity change, appetite change and unexpected weight change.  HENT: Positive for congestion, sinus pain, sinus pressure and sore throat. Negative for mouth sores.   Eyes: Negative for visual disturbance.  Respiratory: Positive for cough, shortness of breath and wheezing. Negative for chest tightness.   Gastrointestinal: Positive for diarrhea. Negative for abdominal pain and nausea.  Genitourinary: Negative for difficulty urinating, frequency and vaginal pain.  Musculoskeletal: Positive for arthralgias, back pain and gait problem.  Skin: Negative for pallor and rash.  Neurological: Negative for dizziness, tremors, weakness, numbness and headaches.  Psychiatric/Behavioral: Negative for confusion and sleep disturbance.    Objective:  BP 140/80   Pulse 85   Temp 98.5 F (36.9 C) (Oral)   Wt 232 lb (105.2 kg)  SpO2 95%   BMI 41.10 kg/m   BP Readings from Last 3 Encounters:  11/12/16 140/80  10/14/16 (!) 145/68  10/13/16 (!) 141/75    Wt Readings from Last 3 Encounters:  11/12/16 232 lb (105.2 kg)  10/13/16 235 lb 6.4 oz (106.8 kg)  10/11/16 235 lb (106.6 kg)    Physical Exam  Constitutional: She appears well-developed. No distress.  HENT:  Head: Normocephalic.  Right Ear: External ear normal.  Left Ear: External ear normal.  Nose: Nose normal.  Mouth/Throat: Oropharynx is clear and moist.  Eyes: Conjunctivae are normal. Pupils are equal, round, and reactive to light. Right  eye exhibits no discharge. Left eye exhibits no discharge.  Neck: Normal range of motion. Neck supple. No JVD present. No tracheal deviation present. No thyromegaly present.  Cardiovascular: Normal rate, regular rhythm and normal heart sounds.   Pulmonary/Chest: No stridor. No respiratory distress. She has wheezes. She exhibits no tenderness.  Abdominal: Soft. Bowel sounds are normal. She exhibits no distension and no mass. There is no tenderness. There is no rebound and no guarding.  Musculoskeletal: She exhibits tenderness. She exhibits no edema.  Lymphadenopathy:    She has no cervical adenopathy.  Neurological: She displays normal reflexes. No cranial nerve deficit. She exhibits normal muscle tone. Coordination abnormal.  Skin: No rash noted. No erythema.  Psychiatric: She has a normal mood and affect. Her behavior is normal. Judgment and thought content normal.  stooped forward Cane  Coughing DOE   No results found.  Assessment & Plan:   There are no diagnoses linked to this encounter. I am having [name] [name] maintain her Vitamin D3, meclizine, docusate sodium, polyethylene glycol, tiZANidine, promethazine, losartan-hydrochlorothiazide, ALPRAZolam, anastrozole, rivaroxaban, triamcinolone ointment, amLODipine, pantoprazole, spironolactone, and acetaminophen-codeine.  No orders of the defined types were placed in this encounter.    Follow-up: No Follow-up on file.  Walker Kehr, MD

## 2016-11-12 NOTE — Patient Instructions (Signed)
Use over-the-counter  "cold" medicines  such as"Afrin" nasal spray for nasal congestion as directed instead. Use " Delsym" or" Robitussin" cough syrup varietis for cough.  You can use plain "Tylenol" or "Advil" for fever, chills and achyness. Use Halls or Ricola cough drops.  Please, make an appointment if you are not better or if you're worse.  

## 2016-11-12 NOTE — Assessment & Plan Note (Signed)
Lomotil [facility]

## 2016-11-12 NOTE — Assessment & Plan Note (Signed)
CXR Treat URI

## 2016-11-12 NOTE — Progress Notes (Signed)
Pre visit review using our clinic review tool, if applicable. No additional management support is needed unless otherwise documented below in the visit note. 

## 2016-11-29 ENCOUNTER — Ambulatory Visit (INDEPENDENT_AMBULATORY_CARE_PROVIDER_SITE_OTHER): Payer: Medicare Other | Admitting: Internal Medicine

## 2016-11-29 ENCOUNTER — Encounter: Payer: Self-pay | Admitting: Internal Medicine

## 2016-11-29 DIAGNOSIS — R7989 Other specified abnormal findings of blood chemistry: Secondary | ICD-10-CM | POA: Diagnosis not present

## 2016-11-29 DIAGNOSIS — R197 Diarrhea, unspecified: Secondary | ICD-10-CM

## 2016-11-29 DIAGNOSIS — M545 Low back pain: Secondary | ICD-10-CM | POA: Diagnosis not present

## 2016-11-29 DIAGNOSIS — G8929 Other chronic pain: Secondary | ICD-10-CM

## 2016-11-29 DIAGNOSIS — L853 Xerosis cutis: Secondary | ICD-10-CM

## 2016-11-29 DIAGNOSIS — J4520 Mild intermittent asthma, uncomplicated: Secondary | ICD-10-CM

## 2016-11-29 MED ORDER — TRIAMCINOLONE ACETONIDE 0.5 % EX OINT: 1.0000 "application " | TOPICAL_OINTMENT | Freq: Three times a day (TID) | CUTANEOUS | 2 refills | Status: DC

## 2016-11-29 NOTE — Assessment & Plan Note (Signed)
Much better MRI pending

## 2016-11-29 NOTE — Assessment & Plan Note (Signed)
Kenalog oint prn

## 2016-11-29 NOTE — Assessment & Plan Note (Signed)
Resolving

## 2016-11-29 NOTE — Assessment & Plan Note (Signed)
Cough is much better CXR pending

## 2016-11-29 NOTE — Progress Notes (Signed)
Pre visit review using our clinic review tool, if applicable. No additional management support is needed unless otherwise documented below in the visit note. 

## 2016-11-29 NOTE — Progress Notes (Signed)
Subjective:  Patient ID: [name] [name], female    DOB: Dec 14, 1940  Age: 76 y.o. MRN: [id number]  CC: No chief complaint on file.   HPI TYIA BINFORD presents for diarrhea, cough - 85% better. The pt is constipated now. F/u anxiety  Outpatient Medications Prior to Visit  Medication Sig Dispense Refill  . acetaminophen-codeine (TYLENOL #2) 300-15 MG tablet Take 1 tablet by mouth every 6 (six) hours as needed for severe pain. 90 tablet 3  . ALPRAZolam (XANAX) 0.5 MG tablet Take 1 tablet (0.5 mg total) by mouth 2 (two) times daily as needed for anxiety or sleep. 60 tablet 2  . amLODipine (NORVASC) 10 MG tablet Take 0.5 tablets (5 mg total) by mouth daily. 90 tablet 3  . anastrozole (ARIMIDEX) 1 MG tablet Take 1 tablet (1 mg total) by mouth daily. 90 tablet 2  . cefdinir (OMNICEF) 300 MG capsule Take 1 capsule (300 mg total) by mouth 2 (two) times daily. 20 capsule 0  . Cholecalciferol (VITAMIN D3) 1000 UNITS CAPS Take 1 capsule by mouth daily.     . diphenoxylate-atropine (LOMOTIL) 2.5-0.025 MG tablet Take 1 tablet by mouth 4 (four) times daily as needed for diarrhea or loose stools. 60 tablet 1  . docusate sodium (COLACE) 100 MG capsule Take 1 capsule (100 mg total) by mouth 2 (two) times daily. 10 capsule 0  . losartan-hydrochlorothiazide (HYZAAR) 100-25 MG tablet Take 1 tablet by mouth daily. 90 tablet 3  . meclizine (ANTIVERT) 12.5 MG tablet Take 1 tablet (12.5 mg total) by mouth 2 (two) times daily as needed for dizziness. 10 tablet 0  . pantoprazole (PROTONIX) 40 MG tablet Take 1 tablet (40 mg total) by mouth daily. 90 tablet 3  . polyethylene glycol (MIRALAX / GLYCOLAX) packet Take 17 g by mouth 2 (two) times daily. 14 each 0  . predniSONE (DELTASONE) 10 MG tablet Prednisone 10 mg: take 4 tabs a day x 3 days; then 3 tabs a day x 4 days; then 2 tabs a day x 4 days, then 1 tab a day x 6 days, then stop. Take pc. 38 tablet 1  . promethazine (PHENERGAN) 12.5 MG tablet Take 1 tablet  (12.5 mg total) by mouth every 6 (six) hours as needed for nausea or vomiting. 30 tablet 0  . rivaroxaban (XARELTO) 20 MG TABS tablet Take 1 tablet (20 mg total) by mouth daily. 30 tablet 11  . spironolactone (ALDACTONE) 50 MG tablet Take 1 tablet (50 mg total) by mouth daily. 90 tablet 3  . tiZANidine (ZANAFLEX) 4 MG tablet Take 1 tablet (4 mg total) by mouth every 6 (six) hours as needed for muscle spasms. 40 tablet 0  . triamcinolone ointment (KENALOG) 0.5 % Apply 1 application topically 3 (three) times daily. 30 g 2   Facility-Administered Medications Prior to Visit  Medication Dose Route Frequency Provider Last Rate Last Dose  . alteplase (CATHFLO ACTIVASE) injection 2 mg  2 mg Intracatheter Once PRN Curt Bears, MD      . denosumab (XGEVA) injection 120 mg  120 mg Subcutaneous Once Curt Bears, MD        ROS Review of Systems  Constitutional: Negative for activity change, appetite change, chills, fatigue and unexpected weight change.  HENT: Negative for congestion, mouth sores and sinus pressure.   Eyes: Negative for visual disturbance.  Respiratory: Positive for cough. Negative for chest tightness.   Gastrointestinal: Positive for constipation. Negative for abdominal pain and nausea.  Genitourinary: Negative for  difficulty urinating, frequency and vaginal pain.  Musculoskeletal: Positive for back pain and gait problem.  Skin: Negative for pallor and rash.  Neurological: Negative for dizziness, tremors, weakness, numbness and headaches.  Psychiatric/Behavioral: Negative for confusion and sleep disturbance.    Objective:  BP 138/78   Pulse 93   Temp 98.4 F (36.9 C) (Oral)   Resp 20   Wt 231 lb 8 oz (105 kg)   SpO2 98%   BMI 41.01 kg/m   BP Readings from Last 3 Encounters:  11/29/16 138/78  11/12/16 140/80  10/14/16 (!) 145/68    Wt Readings from Last 3 Encounters:  11/29/16 231 lb 8 oz (105 kg)  11/12/16 232 lb (105.2 kg)  10/13/16 235 lb 6.4 oz (106.8 kg)      Physical Exam  Constitutional: She appears well-developed. No distress.  HENT:  Head: Normocephalic.  Right Ear: External ear normal.  Left Ear: External ear normal.  Nose: Nose normal.  Mouth/Throat: Oropharynx is clear and moist.  Eyes: Conjunctivae are normal. Pupils are equal, round, and reactive to light. Right eye exhibits no discharge. Left eye exhibits no discharge.  Neck: Normal range of motion. Neck supple. No JVD present. No tracheal deviation present. No thyromegaly present.  Cardiovascular: Normal rate, regular rhythm and normal heart sounds.   Pulmonary/Chest: No stridor. No respiratory distress. She has no wheezes.  Abdominal: Soft. Bowel sounds are normal. She exhibits no distension and no mass. There is no tenderness. There is no rebound and no guarding.  Musculoskeletal: She exhibits tenderness. She exhibits no edema.  Lymphadenopathy:    She has no cervical adenopathy.  Neurological: She displays normal reflexes. No cranial nerve deficit. She exhibits normal muscle tone. Coordination abnormal.  Skin: No rash noted. No erythema.  Psychiatric: She has a normal mood and affect. Her behavior is normal. Judgment and thought content normal.  Obese Cane LS tender    No results found.  Assessment & Plan:   There are no diagnoses linked to this encounter. I am having Ms. Test maintain her Vitamin D3, meclizine, docusate sodium, polyethylene glycol, tiZANidine, promethazine, losartan-hydrochlorothiazide, ALPRAZolam, anastrozole, rivaroxaban, triamcinolone ointment, amLODipine, pantoprazole, spironolactone, acetaminophen-codeine, cefdinir, predniSONE, and diphenoxylate-atropine.  No orders of the defined types were placed in this encounter.    Follow-up: No Follow-up on file.  Walker Kehr, MD

## 2016-11-29 NOTE — Assessment & Plan Note (Signed)
Monitor labs 

## 2017-01-10 ENCOUNTER — Other Ambulatory Visit: Payer: Medicare Other

## 2017-01-11 ENCOUNTER — Telehealth: Payer: Self-pay | Admitting: Internal Medicine

## 2017-01-11 NOTE — Telephone Encounter (Signed)
sw pt to confirm added lab appt 3/14 at 0800 per LOS

## 2017-01-12 ENCOUNTER — Encounter (HOSPITAL_BASED_OUTPATIENT_CLINIC_OR_DEPARTMENT_OTHER): Payer: Medicare Other

## 2017-01-12 ENCOUNTER — Ambulatory Visit: Payer: Medicare Other

## 2017-01-12 ENCOUNTER — Encounter: Payer: Self-pay | Admitting: Internal Medicine

## 2017-01-12 ENCOUNTER — Telehealth: Payer: Self-pay | Admitting: Internal Medicine

## 2017-01-12 ENCOUNTER — Ambulatory Visit (HOSPITAL_BASED_OUTPATIENT_CLINIC_OR_DEPARTMENT_OTHER): Payer: Medicare Other | Admitting: Internal Medicine

## 2017-01-12 ENCOUNTER — Other Ambulatory Visit (HOSPITAL_BASED_OUTPATIENT_CLINIC_OR_DEPARTMENT_OTHER): Payer: Medicare Other

## 2017-01-12 VITALS — BP 163/95 | HR 97 | Temp 97.5°F | Resp 18 | Ht 63.0 in | Wt 230.0 lb

## 2017-01-12 DIAGNOSIS — G8929 Other chronic pain: Secondary | ICD-10-CM

## 2017-01-12 DIAGNOSIS — C7951 Secondary malignant neoplasm of bone: Secondary | ICD-10-CM

## 2017-01-12 DIAGNOSIS — C50911 Malignant neoplasm of unspecified site of right female breast: Secondary | ICD-10-CM | POA: Diagnosis not present

## 2017-01-12 DIAGNOSIS — M545 Low back pain: Secondary | ICD-10-CM

## 2017-01-12 DIAGNOSIS — M542 Cervicalgia: Secondary | ICD-10-CM

## 2017-01-12 DIAGNOSIS — Z95828 Presence of other vascular implants and grafts: Secondary | ICD-10-CM

## 2017-01-12 DIAGNOSIS — R5383 Other fatigue: Secondary | ICD-10-CM

## 2017-01-12 LAB — COMPREHENSIVE METABOLIC PANEL
ALBUMIN: 3.5 g/dL (ref 3.5–5.0)
ALT: 12 U/L (ref 0–55)
AST: 21 U/L (ref 5–34)
Alkaline Phosphatase: 118 U/L (ref 40–150)
Anion Gap: 14 mEq/L — ABNORMAL HIGH (ref 3–11)
BILIRUBIN TOTAL: 0.6 mg/dL (ref 0.20–1.20)
BUN: 17.7 mg/dL (ref 7.0–26.0)
CO2: 26 meq/L (ref 22–29)
CREATININE: 1.5 mg/dL — AB (ref 0.6–1.1)
Calcium: 9.8 mg/dL (ref 8.4–10.4)
Chloride: 103 mEq/L (ref 98–109)
EGFR: 40 mL/min/{1.73_m2} — ABNORMAL LOW (ref >90)
GLUCOSE: 115 mg/dL (ref 70–140)
Potassium: 3.5 mEq/L (ref 3.5–5.1)
SODIUM: 143 meq/L (ref 136–145)
TOTAL PROTEIN: 8.1 g/dL (ref 6.4–8.3)

## 2017-01-12 LAB — CBC WITH DIFFERENTIAL/PLATELET
BASO%: 0.7 % (ref 0.0–2.0)
Basophils Absolute: 0 10*3/uL (ref 0.0–0.1)
EOS ABS: 0.1 10*3/uL (ref 0.0–0.5)
EOS%: 2.2 % (ref 0.0–7.0)
HCT: 41.2 % (ref 34.8–46.6)
HEMOGLOBIN: 13.4 g/dL (ref 11.6–15.9)
LYMPH%: 17.8 % (ref 14.0–49.7)
MCH: 28 pg (ref 25.1–34.0)
MCHC: 32.6 g/dL (ref 31.5–36.0)
MCV: 85.9 fL (ref 79.5–101.0)
MONO#: 0.6 10*3/uL (ref 0.1–0.9)
MONO%: 11.1 % (ref 0.0–14.0)
NEUT%: 68.2 % (ref 38.4–76.8)
NEUTROS ABS: 3.7 10*3/uL (ref 1.5–6.5)
Platelets: 290 10*3/uL (ref 145–400)
RBC: 4.8 10*6/uL (ref 3.70–5.45)
RDW: 17.6 % — AB (ref 11.2–14.5)
WBC: 5.5 10*3/uL (ref 3.9–10.3)
lymph#: 1 10*3/uL (ref 0.9–3.3)

## 2017-01-12 MED ORDER — DENOSUMAB 120 MG/1.7ML ~~LOC~~ SOLN
120.0000 mg | Freq: Once | SUBCUTANEOUS | Status: AC
  Administered 2017-01-12: 120 mg via SUBCUTANEOUS
  Filled 2017-01-12: qty 1.7

## 2017-01-12 NOTE — Patient Instructions (Signed)
Denosumab injection What is this medicine? DENOSUMAB (den oh sue mab) slows bone breakdown. Prolia is used to treat osteoporosis in women after menopause and in men. Xgeva is used to prevent bone fractures and other bone problems caused by cancer bone metastases. Xgeva is also used to treat giant cell tumor of the bone. This medicine may be used for other purposes; ask your health care provider or pharmacist if you have questions. COMMON BRAND NAME(S): Prolia, XGEVA What should I tell my health care provider before I take this medicine? They need to know if you have any of these conditions: -dental disease -eczema -infection or history of infections -kidney disease or on dialysis -low blood calcium or vitamin D -malabsorption syndrome -scheduled to have surgery or tooth extraction -taking medicine that contains denosumab -thyroid or parathyroid disease -an unusual reaction to denosumab, other medicines, foods, dyes, or preservatives -pregnant or trying to get pregnant -breast-feeding How should I use this medicine? This medicine is for injection under the skin. It is given by a health care professional in a hospital or clinic setting. If you are getting Prolia, a special MedGuide will be given to you by the pharmacist with each prescription and refill. Be sure to read this information carefully each time. For Prolia, talk to your pediatrician regarding the use of this medicine in children. Special care may be needed. For Xgeva, talk to your pediatrician regarding the use of this medicine in children. While this drug may be prescribed for children as young as 13 years for selected conditions, precautions do apply. Overdosage: If you think you've taken too much of this medicine contact a poison control center or emergency room at once. Overdosage: If you think you have taken too much of this medicine contact a poison control center or emergency room at once. NOTE: This medicine is only for  you. Do not share this medicine with others. What if I miss a dose? It is important not to miss your dose. Call your doctor or health care professional if you are unable to keep an appointment. What may interact with this medicine? Do not take this medicine with any of the following medications: -other medicines containing denosumab This medicine may also interact with the following medications: -medicines that suppress the immune system -medicines that treat cancer -steroid medicines like prednisone or cortisone This list may not describe all possible interactions. Give your health care provider a list of all the medicines, herbs, non-prescription drugs, or dietary supplements you use. Also tell them if you smoke, drink alcohol, or use illegal drugs. Some items may interact with your medicine. What should I watch for while using this medicine? Visit your doctor or health care professional for regular checks on your progress. Your doctor or health care professional may order blood tests and other tests to see how you are doing. Call your doctor or health care professional if you get a cold or other infection while receiving this medicine. Do not treat yourself. This medicine may decrease your body's ability to fight infection. You should make sure you get enough calcium and vitamin D while you are taking this medicine, unless your doctor tells you not to. Discuss the foods you eat and the vitamins you take with your health care professional. See your dentist regularly. Brush and floss your teeth as directed. Before you have any dental work done, tell your dentist you are receiving this medicine. Do not become pregnant while taking this medicine or for 5 months after stopping   it. Women should inform their doctor if they wish to become pregnant or think they might be pregnant. There is a potential for serious side effects to an unborn child. Talk to your health care professional or pharmacist for more  information. What side effects may I notice from receiving this medicine? Side effects that you should report to your doctor or health care professional as soon as possible: -allergic reactions like skin rash, itching or hives, swelling of the face, lips, or tongue -breathing problems -chest pain -fast, irregular heartbeat -feeling faint or lightheaded, falls -fever, chills, or any other sign of infection -muscle spasms, tightening, or twitches -numbness or tingling -skin blisters or bumps, or is dry, peels, or red -slow healing or unexplained pain in the mouth or jaw -unusual bleeding or bruising Side effects that usually do not require medical attention (Report these to your doctor or health care professional if they continue or are bothersome.): -muscle pain -stomach upset, gas This list may not describe all possible side effects. Call your doctor for medical advice about side effects. You may report side effects to FDA at 1-800-FDA-1088. Where should I keep my medicine? This medicine is only given in a clinic, doctor's office, or other health care setting and will not be stored at home. NOTE: This sheet is a summary. It may not cover all possible information. If you have questions about this medicine, talk to your doctor, pharmacist, or health care provider.  2015, [employer] Standard. (2012-04-17 12:37:47)  

## 2017-01-12 NOTE — Telephone Encounter (Signed)
Patient refused to take 2 bottles of Contrast and stated that she prefers to stop by in June to pick up 2 bottles. Appointments scheduled per 01/12/17 los. Patient was given a copy of the AVS report and appointment schedule per 01/12/17 los.

## 2017-01-12 NOTE — Progress Notes (Signed)
[facility] [phone]   [phone]  OFFICE PROGRESS NOTE  Walker Kehr, MD [place] [address]  DIAGNOSIS: Metastatic breast adenocarcinoma presented with bone metastasis in the left hip area this was initially diagnosed as a stage II (T2, N1, M0) invasive right breast carcinoma with positive estrogen and progesterone receptor as well as HER-2/neu in July 2002  PRIOR THERAPY: 1. Status post right lumpectomy with right axillary lymph node dissection revealing 4/9 lymph nodes that were positive for malignancy.  2. Status post 8 cycles of systemic chemotherapy with CMF, completed March 2003.  3. Status post radiotherapy to the remaining right breast under the care of Dr. Tammi Klippel.  4. Status post 2 years of treatment with tamoxifen, started July 2003, then switched to Femara by Dr. Humphrey Rolls in August 2005. The patient discontinued the treatment in February 2009.  5. Status post left total hip replacement on Apr 01, 2011, secondary to pathologic left femoral neck fracture. 6. Zometa 4 mg IV on 3 monthly basis. Discontinued secondary to renal insufficiency.  CURRENT THERAPY: 1. Arimidex 1 mg by mouth daily started 03/18/2011 2. Xgeva 120 g subcutaneously every 3 months.  INTERVAL HISTORY: [name] [name] 76 y.o. female came to the clinic today for follow-up visit. The patient is feeling fine today was no specific complaints except for mild pain in the neck area and fatigue. She denied having any chest pain, shortness of breath, cough or hemoptysis. She has no nausea, vomiting, diarrhea or constipation. She denied having any fever or chills. She denied having any significant weight loss or night sweats. She is here today for evaluation and repeat blood work and also to receive her Xgeva injection.   MEDICAL HISTORY: Past Medical History:  Diagnosis Date  . Anxiety    hx of  . Asthma   . Breast cancer ([facility])    2002; metastatic in  2012, right breast, spread to left hip in 2012  . Cerebrovascular accident [facility])    R thalamic CVA 01/2010  . Dyslipidemia   . Hyperlipemia   . Hypertension   . Low back pain   . Osteoarthritis   . TIA (transient ischemic attack)    x2 in 2011  . Vertigo 2014    ALLERGIES:  has No Known Allergies.  MEDICATIONS:  Current Outpatient Prescriptions  Medication Sig Dispense Refill  . acetaminophen-codeine (TYLENOL #2) 300-15 MG tablet Take 1 tablet by mouth every 6 (six) hours as needed for severe pain. 90 tablet 3  . ALPRAZolam (XANAX) 0.5 MG tablet Take 1 tablet (0.5 mg total) by mouth 2 (two) times daily as needed for anxiety or sleep. 60 tablet 2  . amLODipine (NORVASC) 10 MG tablet Take 0.5 tablets (5 mg total) by mouth daily. 90 tablet 3  . anastrozole (ARIMIDEX) 1 MG tablet Take 1 tablet (1 mg total) by mouth daily. 90 tablet 2  . cefdinir (OMNICEF) 300 MG capsule Take 1 capsule (300 mg total) by mouth 2 (two) times daily. 20 capsule 0  . Cholecalciferol (VITAMIN D3) 1000 UNITS CAPS Take 1 capsule by mouth daily.     . diphenoxylate-atropine (LOMOTIL) 2.5-0.025 MG tablet Take 1 tablet by mouth 4 (four) times daily as needed for diarrhea or loose stools. 60 tablet 1  . docusate sodium (COLACE) 100 MG capsule Take 1 capsule (100 mg total) by mouth 2 (two) times daily. 10 capsule 0  . losartan-hydrochlorothiazide (HYZAAR) 100-25 MG tablet Take 1  tablet by mouth daily. 90 tablet 3  . meclizine (ANTIVERT) 12.5 MG tablet Take 1 tablet (12.5 mg total) by mouth 2 (two) times daily as needed for dizziness. 10 tablet 0  . pantoprazole (PROTONIX) 40 MG tablet Take 1 tablet (40 mg total) by mouth daily. 90 tablet 3  . polyethylene glycol (MIRALAX / GLYCOLAX) packet Take 17 g by mouth 2 (two) times daily. 14 each 0  . predniSONE (DELTASONE) 10 MG tablet Prednisone 10 mg: take 4 tabs a day x 3 days; then 3 tabs a day x 4 days; then 2 tabs a day x 4 days, then 1 tab a day x 6 days, then stop. Take  pc. 38 tablet 1  . promethazine (PHENERGAN) 12.5 MG tablet Take 1 tablet (12.5 mg total) by mouth every 6 (six) hours as needed for nausea or vomiting. 30 tablet 0  . rivaroxaban (XARELTO) 20 MG TABS tablet Take 1 tablet (20 mg total) by mouth daily. 30 tablet 11  . spironolactone (ALDACTONE) 50 MG tablet Take 1 tablet (50 mg total) by mouth daily. 90 tablet 3  . tiZANidine (ZANAFLEX) 4 MG tablet Take 1 tablet (4 mg total) by mouth every 6 (six) hours as needed for muscle spasms. 40 tablet 0  . triamcinolone ointment (KENALOG) 0.5 % Apply 1 application topically 3 (three) times daily. 30 g 2   No current facility-administered medications for this visit.    Facility-Administered Medications Ordered in Other Visits  Medication Dose Route Frequency Provider Last Rate Last Dose  . alteplase (CATHFLO ACTIVASE) injection 2 mg  2 mg Intracatheter Once PRN Curt Bears, MD      . denosumab (XGEVA) injection 120 mg  120 mg Subcutaneous Once Curt Bears, MD        SURGICAL HISTORY:  Past Surgical History:  Procedure Laterality Date  . ABDOMINAL HYSTERECTOMY     complete  . BREAST LUMPECTOMY     Right breast 2002  . JOINT REPLACEMENT  2012   L hip due to breast ca met  . TOTAL HIP ARTHROPLASTY Right 10/28/2015   Procedure: RIGHT TOTAL HIP ARTHROPLASTY ANTERIOR APPROACH;  Surgeon: Paralee Cancel, MD;  Location: [facility];  Service: Orthopedics;  Laterality: Right;    REVIEW OF SYSTEMS:  A comprehensive review of systems was negative except for: Constitutional: positive for fatigue Musculoskeletal: positive for arthralgias and neck pain   PHYSICAL EXAMINATION: General appearance: alert, cooperative, fatigued and no distress Head: Normocephalic, without obvious abnormality, atraumatic Neck: no adenopathy, no JVD, supple, symmetrical, trachea midline and thyroid not enlarged, symmetric, no tenderness/mass/nodules Lymph nodes: Cervical, supraclavicular, and axillary nodes normal. Resp: clear to  auscultation bilaterally Back: symmetric, no curvature. ROM normal. No CVA tenderness. Cardio: regular rate and rhythm, S1, S2 normal, no murmur, click, rub or gallop GI: soft, non-tender; bowel sounds normal; no masses,  no organomegaly Extremities: extremities normal, atraumatic, no cyanosis or edema  ECOG PERFORMANCE STATUS: 1 - Symptomatic but completely ambulatory  Blood pressure (!) 163/95, pulse 97, temperature 97.5 F (36.4 C), temperature source Oral, resp. rate 18, height 5' 3" (1.6 m), weight 230 lb (104.3 kg), SpO2 100 %.  LABORATORY DATA: Lab Results  Component Value Date   WBC 5.5 01/12/2017   HGB 13.4 01/12/2017   HCT 41.2 01/12/2017   MCV 85.9 01/12/2017   PLT 290 01/12/2017      Chemistry      Component Value Date/Time   NA 143 01/12/2017 0844   K 3.5 01/12/2017 0844  CL 105 03/02/2016 0834   CL 109 (H) 04/25/2013 1023   CO2 26 01/12/2017 0844   BUN 17.7 01/12/2017 0844   CREATININE 1.5 (H) 01/12/2017 0844      Component Value Date/Time   CALCIUM 9.8 01/12/2017 0844   ALKPHOS 118 01/12/2017 0844   AST 21 01/12/2017 0844   ALT 12 01/12/2017 0844   BILITOT 0.60 01/12/2017 0844       RADIOGRAPHIC STUDIES: No results found.  ASSESSMENT AND PLAN:  This is a very pleasant 76 years old African-American female with metastatic breast carcinoma with bone metastasis. The patient is currently receiving treatment with oral Arimidex as well as Xgeva every 3 months for her metastatic bone disease. She is tolerating the treatment well with no significant issues. I recommended for her to continue her current treatment. I will see her back for follow-up visit in 3 months for evaluation after repeating CT scan of the chest, abdomen and pelvis in addition to bone scan for restaging of her disease. She was advised to call immediately if she has any concerning symptoms in the interval. The patient voices understanding of current disease status and treatment options and  is in agreement with the current care plan.  All questions were answered. The patient knows to call the clinic with any problems, questions or concerns. We can certainly see the patient much sooner if necessary. I spent 10 minutes counseling the patient face to face. The total time spent in the appointment was 15 minutes.  Disclaimer: This note was dictated with voice recognition software. Similar sounding words can inadvertently be transcribed and may not be corrected upon review.

## 2017-01-25 ENCOUNTER — Telehealth: Payer: Self-pay | Admitting: Medical Oncology

## 2017-01-25 NOTE — Telephone Encounter (Signed)
Left message for [name] [name] in CS to return my call.

## 2017-02-25 ENCOUNTER — Ambulatory Visit: Payer: Medicare Other | Admitting: Internal Medicine

## 2017-03-08 ENCOUNTER — Encounter: Payer: Self-pay | Admitting: Internal Medicine

## 2017-03-08 ENCOUNTER — Ambulatory Visit (INDEPENDENT_AMBULATORY_CARE_PROVIDER_SITE_OTHER): Payer: Medicare Other | Admitting: Internal Medicine

## 2017-03-08 DIAGNOSIS — M79602 Pain in left arm: Secondary | ICD-10-CM | POA: Insufficient documentation

## 2017-03-08 DIAGNOSIS — I10 Essential (primary) hypertension: Secondary | ICD-10-CM

## 2017-03-08 MED ORDER — ALPRAZOLAM 0.5 MG PO TABS: 0.5000 mg | ORAL_TABLET | Freq: Two times a day (BID) | ORAL | 2 refills | Status: DC | PRN

## 2017-03-08 NOTE — Progress Notes (Signed)
Subjective:  Patient ID: [name] [name], female    DOB: 1941/01/21  Age: 76 y.o. MRN: [id number]  CC: No chief complaint on file.   HPI [name] [name] presents for 3 mo f/u of HTN, anxiety - worse, GERD f/u C/o L arm, elbow shooting pains x 2 weeks. The pt stopped Xarelto 2 mo ago - ran out of Rx...  Outpatient Medications Prior to Visit  Medication Sig Dispense Refill  . acetaminophen-codeine (TYLENOL #2) 300-15 MG tablet Take 1 tablet by mouth every 6 (six) hours as needed for severe pain. 90 tablet 3  . ALPRAZolam (XANAX) 0.5 MG tablet Take 1 tablet (0.5 mg total) by mouth 2 (two) times daily as needed for anxiety or sleep. 60 tablet 2  . amLODipine (NORVASC) 10 MG tablet Take 0.5 tablets (5 mg total) by mouth daily. 90 tablet 3  . anastrozole (ARIMIDEX) 1 MG tablet Take 1 tablet (1 mg total) by mouth daily. 90 tablet 2  . cefdinir (OMNICEF) 300 MG capsule Take 1 capsule (300 mg total) by mouth 2 (two) times daily. 20 capsule 0  . Cholecalciferol (VITAMIN D3) 1000 UNITS CAPS Take 1 capsule by mouth daily.     . diphenoxylate-atropine (LOMOTIL) 2.5-0.025 MG tablet Take 1 tablet by mouth 4 (four) times daily as needed for diarrhea or loose stools. 60 tablet 1  . docusate sodium (COLACE) 100 MG capsule Take 1 capsule (100 mg total) by mouth 2 (two) times daily. 10 capsule 0  . losartan-hydrochlorothiazide (HYZAAR) 100-25 MG tablet Take 1 tablet by mouth daily. 90 tablet 3  . meclizine (ANTIVERT) 12.5 MG tablet Take 1 tablet (12.5 mg total) by mouth 2 (two) times daily as needed for dizziness. 10 tablet 0  . pantoprazole (PROTONIX) 40 MG tablet Take 1 tablet (40 mg total) by mouth daily. 90 tablet 3  . polyethylene glycol (MIRALAX / GLYCOLAX) packet Take 17 g by mouth 2 (two) times daily. 14 each 0  . predniSONE (DELTASONE) 10 MG tablet Prednisone 10 mg: take 4 tabs a day x 3 days; then 3 tabs a day x 4 days; then 2 tabs a day x 4 days, then 1 tab a day x 6 days, then stop. Take pc. 38  tablet 1  . promethazine (PHENERGAN) 12.5 MG tablet Take 1 tablet (12.5 mg total) by mouth every 6 (six) hours as needed for nausea or vomiting. 30 tablet 0  . rivaroxaban (XARELTO) 20 MG TABS tablet Take 1 tablet (20 mg total) by mouth daily. 30 tablet 11  . spironolactone (ALDACTONE) 50 MG tablet Take 1 tablet (50 mg total) by mouth daily. 90 tablet 3  . tiZANidine (ZANAFLEX) 4 MG tablet Take 1 tablet (4 mg total) by mouth every 6 (six) hours as needed for muscle spasms. 40 tablet 0  . triamcinolone ointment (KENALOG) 0.5 % Apply 1 application topically 3 (three) times daily. 30 g 2   Facility-Administered Medications Prior to Visit  Medication Dose Route Frequency Provider Last Rate Last Dose  . alteplase (CATHFLO ACTIVASE) injection 2 mg  2 mg Intracatheter Once PRN Curt Bears, MD      . denosumab (XGEVA) injection 120 mg  120 mg Subcutaneous Once Curt Bears, MD        ROS Review of Systems  Constitutional: Positive for fatigue. Negative for activity change, appetite change, chills and unexpected weight change.  HENT: Negative for congestion, mouth sores and sinus pressure.   Eyes: Negative for visual disturbance.  Respiratory: Negative for  cough and chest tightness.   Gastrointestinal: Negative for abdominal pain and nausea.  Genitourinary: Negative for difficulty urinating, frequency and vaginal pain.  Musculoskeletal: Positive for arthralgias, back pain and gait problem.  Skin: Negative for pallor and rash.  Neurological: Negative for dizziness, tremors, weakness, numbness and headaches.  Psychiatric/Behavioral: Negative for confusion, sleep disturbance and suicidal ideas. The patient is nervous/anxious.     Objective:  BP 122/64 (BP Location: Left Arm, Patient Position: Sitting, Cuff Size: Large)   Pulse 95   Temp 97.8 F (36.6 C) (Oral)   Ht 5\' 3"  (1.6 m)   Wt 224 lb 0.6 oz (101.6 kg)   SpO2 100%   BMI 39.69 kg/m   BP Readings from Last 3 Encounters:    03/08/17 122/64  01/12/17 (!) 163/95  11/29/16 138/78    Wt Readings from Last 3 Encounters:  03/08/17 224 lb 0.6 oz (101.6 kg)  01/12/17 230 lb (104.3 kg)  11/29/16 231 lb 8 oz (105 kg)    Physical Exam  Constitutional: She appears well-developed. No distress.  HENT:  Head: Normocephalic.  Right Ear: External ear normal.  Left Ear: External ear normal.  Nose: Nose normal.  Mouth/Throat: Oropharynx is clear and moist.  Eyes: Conjunctivae are normal. Pupils are equal, round, and reactive to light. Right eye exhibits no discharge. Left eye exhibits no discharge.  Neck: Normal range of motion. Neck supple. No JVD present. No tracheal deviation present. No thyromegaly present.  Cardiovascular: Normal rate, regular rhythm and normal heart sounds.   Pulmonary/Chest: No stridor. No respiratory distress. She has no wheezes.  Abdominal: Soft. Bowel sounds are normal. She exhibits no distension and no mass. There is no tenderness. There is no rebound and no guarding.  Musculoskeletal: She exhibits tenderness. She exhibits no edema.  Lymphadenopathy:    She has no cervical adenopathy.  Neurological: She displays normal reflexes. No cranial nerve deficit. She exhibits normal muscle tone. Coordination normal.  Skin: No rash noted. No erythema.  Psychiatric: She has a normal mood and affect. Her behavior is normal. Judgment and thought content normal.  Cane L anteromed armand elbow tender to palpation   No results found.  Assessment & Plan:   There are no diagnoses linked to this encounter. I am having Ms. Risby maintain her Vitamin D3, meclizine, docusate sodium, polyethylene glycol, tiZANidine, promethazine, losartan-hydrochlorothiazide, ALPRAZolam, anastrozole, rivaroxaban, amLODipine, pantoprazole, spironolactone, acetaminophen-codeine, cefdinir, predniSONE, diphenoxylate-atropine, and triamcinolone ointment.  No orders of the defined types were placed in this  encounter.    Follow-up: No Follow-up on file.  Walker Kehr, MD

## 2017-03-08 NOTE — Progress Notes (Signed)
Pre visit review using our clinic review tool, if applicable. No additional management support is needed unless otherwise documented below in the visit note. 

## 2017-03-08 NOTE — Assessment & Plan Note (Signed)
BP Readings from Last 3 Encounters:  03/08/17 122/64  01/12/17 (!) 163/95  11/29/16 138/78   Hyzaar and Amlodipine

## 2017-03-08 NOTE — Assessment & Plan Note (Addendum)
Check Ven doppler US May need to re-start Xarelto - samples

## 2017-03-10 ENCOUNTER — Ambulatory Visit (HOSPITAL_COMMUNITY)
Admission: RE | Admit: 2017-03-10 | Discharge: 2017-03-10 | Disposition: A | Discharge status: Home / Self Care | Payer: Medicare Other | Source: Ambulatory Visit | Attending: Vascular Surgery | Admitting: Vascular Surgery

## 2017-03-10 ENCOUNTER — Telehealth: Payer: Self-pay | Admitting: Internal Medicine

## 2017-03-10 DIAGNOSIS — M79602 Pain in left arm: Secondary | ICD-10-CM | POA: Diagnosis not present

## 2017-03-10 NOTE — Telephone Encounter (Signed)
Helene from the [facility] called with results from the pts upper extremity venous duplex that was performed. She said that the pt is negative for deep or superficial thrombosis in her left upper extremity. The pt will be sent home and any follow up treatment can be given directly to the pt.

## 2017-03-11 NOTE — Telephone Encounter (Signed)
See below

## 2017-03-11 NOTE — Telephone Encounter (Signed)
Noted. thx 

## 2017-04-04 ENCOUNTER — Ambulatory Visit (INDEPENDENT_AMBULATORY_CARE_PROVIDER_SITE_OTHER): Payer: Medicare Other | Admitting: Internal Medicine

## 2017-04-04 ENCOUNTER — Encounter: Payer: Self-pay | Admitting: Internal Medicine

## 2017-04-04 VITALS — BP 128/76 | HR 88 | Temp 97.7°F | Ht 63.0 in | Wt 224.0 lb

## 2017-04-04 DIAGNOSIS — C7951 Secondary malignant neoplasm of bone: Secondary | ICD-10-CM

## 2017-04-04 DIAGNOSIS — R7989 Other specified abnormal findings of blood chemistry: Secondary | ICD-10-CM

## 2017-04-04 DIAGNOSIS — E1329 Other specified diabetes mellitus with other diabetic kidney complication: Secondary | ICD-10-CM

## 2017-04-04 DIAGNOSIS — K219 Gastro-esophageal reflux disease without esophagitis: Secondary | ICD-10-CM | POA: Diagnosis not present

## 2017-04-04 DIAGNOSIS — F418 Other specified anxiety disorders: Secondary | ICD-10-CM | POA: Diagnosis not present

## 2017-04-04 DIAGNOSIS — I10 Essential (primary) hypertension: Secondary | ICD-10-CM | POA: Diagnosis not present

## 2017-04-04 DIAGNOSIS — C50911 Malignant neoplasm of unspecified site of right female breast: Secondary | ICD-10-CM | POA: Diagnosis not present

## 2017-04-04 DIAGNOSIS — Z23 Encounter for immunization: Secondary | ICD-10-CM

## 2017-04-04 DIAGNOSIS — M79602 Pain in left arm: Secondary | ICD-10-CM

## 2017-04-04 MED ORDER — ZOSTER VAC RECOMB ADJUVANTED 50 MCG/0.5ML IM SUSR: 0.5000 mL | Freq: Once | INTRAMUSCULAR | 1 refills | Status: AC

## 2017-04-04 NOTE — Addendum Note (Signed)
Addended by: Karren Cobble on: 04/04/2017 08:43 AM   Modules accepted: Orders

## 2017-04-04 NOTE — Assessment & Plan Note (Signed)
Bone scan, CT pending No pain now

## 2017-04-04 NOTE — Progress Notes (Signed)
Subjective:  Patient ID: [name] [name], female    DOB: 10/09/1941  Age: 76 y.o. MRN: [id number]  CC: No chief complaint on file.   HPI [name] [name] presents for OA, anxiety, GERD, breast ca f/u L arm pain went away  Outpatient Medications Prior to Visit  Medication Sig Dispense Refill  . acetaminophen-codeine (TYLENOL #2) 300-15 MG tablet Take 1 tablet by mouth every 6 (six) hours as needed for severe pain. 90 tablet 3  . ALPRAZolam (XANAX) 0.5 MG tablet Take 1 tablet (0.5 mg total) by mouth 2 (two) times daily as needed for anxiety or sleep. 60 tablet 2  . amLODipine (NORVASC) 10 MG tablet Take 0.5 tablets (5 mg total) by mouth daily. 90 tablet 3  . anastrozole (ARIMIDEX) 1 MG tablet Take 1 tablet (1 mg total) by mouth daily. 90 tablet 2  . Cholecalciferol (VITAMIN D3) 1000 UNITS CAPS Take 1 capsule by mouth daily.     . diphenoxylate-atropine (LOMOTIL) 2.5-0.025 MG tablet Take 1 tablet by mouth 4 (four) times daily as needed for diarrhea or loose stools. 60 tablet 1  . docusate sodium (COLACE) 100 MG capsule Take 1 capsule (100 mg total) by mouth 2 (two) times daily. 10 capsule 0  . losartan-hydrochlorothiazide (HYZAAR) 100-25 MG tablet Take 1 tablet by mouth daily. 90 tablet 3  . meclizine (ANTIVERT) 12.5 MG tablet Take 1 tablet (12.5 mg total) by mouth 2 (two) times daily as needed for dizziness. 10 tablet 0  . pantoprazole (PROTONIX) 40 MG tablet Take 1 tablet (40 mg total) by mouth daily. 90 tablet 3  . polyethylene glycol (MIRALAX / GLYCOLAX) packet Take 17 g by mouth 2 (two) times daily. 14 each 0  . promethazine (PHENERGAN) 12.5 MG tablet Take 1 tablet (12.5 mg total) by mouth every 6 (six) hours as needed for nausea or vomiting. 30 tablet 0  . rivaroxaban (XARELTO) 20 MG TABS tablet Take 1 tablet (20 mg total) by mouth daily. 30 tablet 11  . spironolactone (ALDACTONE) 50 MG tablet Take 1 tablet (50 mg total) by mouth daily. 90 tablet 3  . tiZANidine (ZANAFLEX) 4 MG tablet  Take 1 tablet (4 mg total) by mouth every 6 (six) hours as needed for muscle spasms. 40 tablet 0  . triamcinolone ointment (KENALOG) 0.5 % Apply 1 application topically 3 (three) times daily. 30 g 2   Facility-Administered Medications Prior to Visit  Medication Dose Route Frequency Provider Last Rate Last Dose  . alteplase (CATHFLO ACTIVASE) injection 2 mg  2 mg Intracatheter Once PRN [name] Bears, MD      . denosumab (XGEVA) injection 120 mg  120 mg Subcutaneous Once [name] Bears, MD        ROS Review of Systems  Constitutional: Negative for activity change, appetite change, chills, fatigue and unexpected weight change.  HENT: Negative for congestion, mouth sores and sinus pressure.   Eyes: Negative for visual disturbance.  Respiratory: Negative for cough and chest tightness.   Gastrointestinal: Negative for abdominal pain and nausea.  Genitourinary: Negative for difficulty urinating, frequency and vaginal pain.  Musculoskeletal: Positive for arthralgias, back pain and gait problem.  Skin: Negative for pallor and rash.  Neurological: Negative for dizziness, tremors, weakness, numbness and headaches.  Psychiatric/Behavioral: Negative for confusion and sleep disturbance.    Objective:  BP 128/76 (BP Location: Left Arm, Patient Position: Sitting, Cuff Size: Large)   Pulse 88   Temp 97.7 F (36.5 C) (Oral)   Ht 5\' 3"  (1.6  m)   Wt 224 lb (101.6 kg)   SpO2 96%   BMI 39.68 kg/m   BP Readings from Last 3 Encounters:  04/04/17 128/76  03/08/17 122/64  01/12/17 (!) 163/95    Wt Readings from Last 3 Encounters:  04/04/17 224 lb (101.6 kg)  03/08/17 224 lb 0.6 oz (101.6 kg)  01/12/17 230 lb (104.3 kg)    Physical Exam  Constitutional: She appears well-developed. No distress.  HENT:  Head: Normocephalic.  Right Ear: External ear normal.  Left Ear: External ear normal.  Nose: Nose normal.  Mouth/Throat: Oropharynx is clear and moist.  Eyes: Conjunctivae are normal.  Pupils are equal, round, and reactive to light. Right eye exhibits no discharge. Left eye exhibits no discharge.  Neck: Normal range of motion. Neck supple. No JVD present. No tracheal deviation present. No thyromegaly present.  Cardiovascular: Normal rate, regular rhythm and normal heart sounds.   Pulmonary/Chest: No stridor. No respiratory distress. She has no wheezes.  Abdominal: Soft. Bowel sounds are normal. She exhibits no distension and no mass. There is no tenderness. There is no rebound and no guarding.  Musculoskeletal: She exhibits no edema or tenderness.  Lymphadenopathy:    She has no cervical adenopathy.  Neurological: She displays normal reflexes. No cranial nerve deficit. She exhibits normal muscle tone. Coordination normal.  Skin: No rash noted. No erythema.  Psychiatric: She has a normal mood and affect. Her behavior is normal. Judgment and thought content normal.  Cane    No results found.  Assessment & Plan:   There are no diagnoses linked to this encounter. I am having Ms. Both maintain her Vitamin D3, meclizine, docusate sodium, polyethylene glycol, tiZANidine, promethazine, losartan-hydrochlorothiazide, anastrozole, rivaroxaban, amLODipine, pantoprazole, spironolactone, acetaminophen-codeine, diphenoxylate-atropine, triamcinolone ointment, and ALPRAZolam.  No orders of the defined types were placed in this encounter.    Follow-up: No Follow-up on file.  [name] Kehr, MD

## 2017-04-04 NOTE — Assessment & Plan Note (Signed)
Xanax prn 

## 2017-04-04 NOTE — Assessment & Plan Note (Signed)
Cont w/Hyzaar, Amlodipine

## 2017-04-04 NOTE — Assessment & Plan Note (Signed)
Resolved Doppler (-)

## 2017-04-04 NOTE — Assessment & Plan Note (Signed)
ADA diet Labs

## 2017-04-04 NOTE — Assessment & Plan Note (Signed)
Cont w/Protonix 

## 2017-04-04 NOTE — Assessment & Plan Note (Signed)
Labs

## 2017-04-12 ENCOUNTER — Other Ambulatory Visit (HOSPITAL_BASED_OUTPATIENT_CLINIC_OR_DEPARTMENT_OTHER): Payer: Medicare Other

## 2017-04-12 ENCOUNTER — Ambulatory Visit (HOSPITAL_COMMUNITY): Payer: Medicare Other

## 2017-04-12 ENCOUNTER — Other Ambulatory Visit (HOSPITAL_COMMUNITY): Payer: Medicare Other

## 2017-04-12 DIAGNOSIS — C7951 Secondary malignant neoplasm of bone: Secondary | ICD-10-CM

## 2017-04-12 DIAGNOSIS — C50911 Malignant neoplasm of unspecified site of right female breast: Secondary | ICD-10-CM

## 2017-04-12 LAB — CBC WITH DIFFERENTIAL/PLATELET
BASO%: 0.7 % (ref 0.0–2.0)
Basophils Absolute: 0 10*3/uL (ref 0.0–0.1)
EOS%: 1.9 % (ref 0.0–7.0)
Eosinophils Absolute: 0.1 10*3/uL (ref 0.0–0.5)
HEMATOCRIT: 39.9 % (ref 34.8–46.6)
HEMOGLOBIN: 13 g/dL (ref 11.6–15.9)
LYMPH#: 1.1 10*3/uL (ref 0.9–3.3)
LYMPH%: 18.1 % (ref 14.0–49.7)
MCH: 27.9 pg (ref 25.1–34.0)
MCHC: 32.5 g/dL (ref 31.5–36.0)
MCV: 85.7 fL (ref 79.5–101.0)
MONO#: 0.6 10*3/uL (ref 0.1–0.9)
MONO%: 9.9 % (ref 0.0–14.0)
NEUT%: 69.4 % (ref 38.4–76.8)
NEUTROS ABS: 4.3 10*3/uL (ref 1.5–6.5)
Platelets: 264 10*3/uL (ref 145–400)
RBC: 4.66 10*6/uL (ref 3.70–5.45)
RDW: 16 % — ABNORMAL HIGH (ref 11.2–14.5)
WBC: 6.1 10*3/uL (ref 3.9–10.3)

## 2017-04-12 LAB — COMPREHENSIVE METABOLIC PANEL
ALBUMIN: 3.3 g/dL — AB (ref 3.5–5.0)
ALT: 13 U/L (ref 0–55)
AST: 20 U/L (ref 5–34)
Alkaline Phosphatase: 108 U/L (ref 40–150)
Anion Gap: 10 mEq/L (ref 3–11)
BUN: 9.3 mg/dL (ref 7.0–26.0)
CALCIUM: 9.4 mg/dL (ref 8.4–10.4)
CHLORIDE: 106 meq/L (ref 98–109)
CO2: 23 mEq/L (ref 22–29)
CREATININE: 1.1 mg/dL (ref 0.6–1.1)
EGFR: 60 mL/min/{1.73_m2} — ABNORMAL LOW (ref >90)
Glucose: 105 mg/dl (ref 70–140)
Potassium: 4.1 mEq/L (ref 3.5–5.1)
Sodium: 140 mEq/L (ref 136–145)
TOTAL PROTEIN: 7.8 g/dL (ref 6.4–8.3)
Total Bilirubin: 0.82 mg/dL (ref 0.20–1.20)

## 2017-04-13 LAB — CANCER ANTIGEN 27.29: CA 27.29: 97 U/mL — ABNORMAL HIGH (ref 0.0–38.6)

## 2017-04-14 ENCOUNTER — Other Ambulatory Visit: Payer: Medicare Other

## 2017-04-14 ENCOUNTER — Ambulatory Visit (HOSPITAL_BASED_OUTPATIENT_CLINIC_OR_DEPARTMENT_OTHER): Payer: Medicare Other | Admitting: Internal Medicine

## 2017-04-14 ENCOUNTER — Telehealth: Payer: Self-pay | Admitting: Internal Medicine

## 2017-04-14 ENCOUNTER — Ambulatory Visit (HOSPITAL_BASED_OUTPATIENT_CLINIC_OR_DEPARTMENT_OTHER): Payer: Medicare Other

## 2017-04-14 ENCOUNTER — Encounter: Payer: Self-pay | Admitting: Internal Medicine

## 2017-04-14 VITALS — BP 167/79 | HR 82 | Temp 98.1°F | Resp 18 | Ht 63.0 in | Wt 227.9 lb

## 2017-04-14 DIAGNOSIS — R978 Other abnormal tumor markers: Secondary | ICD-10-CM | POA: Diagnosis not present

## 2017-04-14 DIAGNOSIS — C50911 Malignant neoplasm of unspecified site of right female breast: Secondary | ICD-10-CM | POA: Diagnosis not present

## 2017-04-14 DIAGNOSIS — C7951 Secondary malignant neoplasm of bone: Secondary | ICD-10-CM

## 2017-04-14 DIAGNOSIS — I10 Essential (primary) hypertension: Secondary | ICD-10-CM | POA: Diagnosis not present

## 2017-04-14 DIAGNOSIS — Z95828 Presence of other vascular implants and grafts: Secondary | ICD-10-CM

## 2017-04-14 DIAGNOSIS — Z5181 Encounter for therapeutic drug level monitoring: Secondary | ICD-10-CM

## 2017-04-14 HISTORY — DX: Encounter for therapeutic drug level monitoring: Z51.81

## 2017-04-14 MED ORDER — DENOSUMAB 120 MG/1.7ML ~~LOC~~ SOLN
120.0000 mg | Freq: Once | SUBCUTANEOUS | Status: AC
  Administered 2017-04-14: 120 mg via SUBCUTANEOUS
  Filled 2017-04-14: qty 1.7

## 2017-04-14 NOTE — Patient Instructions (Signed)
Denosumab injection What is this medicine? DENOSUMAB (den oh sue mab) slows bone breakdown. Prolia is used to treat osteoporosis in women after menopause and in men. Xgeva is used to prevent bone fractures and other bone problems caused by cancer bone metastases. Xgeva is also used to treat giant cell tumor of the bone. This medicine may be used for other purposes; ask your health care provider or pharmacist if you have questions. COMMON BRAND NAME(S): Prolia, XGEVA What should I tell my health care provider before I take this medicine? They need to know if you have any of these conditions: -dental disease -eczema -infection or history of infections -kidney disease or on dialysis -low blood calcium or vitamin D -malabsorption syndrome -scheduled to have surgery or tooth extraction -taking medicine that contains denosumab -thyroid or parathyroid disease -an unusual reaction to denosumab, other medicines, foods, dyes, or preservatives -pregnant or trying to get pregnant -breast-feeding How should I use this medicine? This medicine is for injection under the skin. It is given by a health care professional in a hospital or clinic setting. If you are getting Prolia, a special MedGuide will be given to you by the pharmacist with each prescription and refill. Be sure to read this information carefully each time. For Prolia, talk to your pediatrician regarding the use of this medicine in children. Special care may be needed. For Xgeva, talk to your pediatrician regarding the use of this medicine in children. While this drug may be prescribed for children as young as 13 years for selected conditions, precautions do apply. Overdosage: If you think you've taken too much of this medicine contact a poison control center or emergency room at once. Overdosage: If you think you have taken too much of this medicine contact a poison control center or emergency room at once. NOTE: This medicine is only for  you. Do not share this medicine with others. What if I miss a dose? It is important not to miss your dose. Call your doctor or health care professional if you are unable to keep an appointment. What may interact with this medicine? Do not take this medicine with any of the following medications: -other medicines containing denosumab This medicine may also interact with the following medications: -medicines that suppress the immune system -medicines that treat cancer -steroid medicines like prednisone or cortisone This list may not describe all possible interactions. Give your health care provider a list of all the medicines, herbs, non-prescription drugs, or dietary supplements you use. Also tell them if you smoke, drink alcohol, or use illegal drugs. Some items may interact with your medicine. What should I watch for while using this medicine? Visit your doctor or health care professional for regular checks on your progress. Your doctor or health care professional may order blood tests and other tests to see how you are doing. Call your doctor or health care professional if you get a cold or other infection while receiving this medicine. Do not treat yourself. This medicine may decrease your body's ability to fight infection. You should make sure you get enough calcium and vitamin D while you are taking this medicine, unless your doctor tells you not to. Discuss the foods you eat and the vitamins you take with your health care professional. See your dentist regularly. Brush and floss your teeth as directed. Before you have any dental work done, tell your dentist you are receiving this medicine. Do not become pregnant while taking this medicine or for 5 months after stopping   it. Women should inform their doctor if they wish to become pregnant or think they might be pregnant. There is a potential for serious side effects to an unborn child. Talk to your health care professional or pharmacist for more  information. What side effects may I notice from receiving this medicine? Side effects that you should report to your doctor or health care professional as soon as possible: -allergic reactions like skin rash, itching or hives, swelling of the face, lips, or tongue -breathing problems -chest pain -fast, irregular heartbeat -feeling faint or lightheaded, falls -fever, chills, or any other sign of infection -muscle spasms, tightening, or twitches -numbness or tingling -skin blisters or bumps, or is dry, peels, or red -slow healing or unexplained pain in the mouth or jaw -unusual bleeding or bruising Side effects that usually do not require medical attention (Report these to your doctor or health care professional if they continue or are bothersome.): -muscle pain -stomach upset, gas This list may not describe all possible side effects. Call your doctor for medical advice about side effects. You may report side effects to FDA at 1-800-FDA-1088. Where should I keep my medicine? This medicine is only given in a clinic, doctor's office, or other health care setting and will not be stored at home. NOTE: This sheet is a summary. It may not cover all possible information. If you have questions about this medicine, talk to your doctor, pharmacist, or health care provider.  2015, [employer] Standard. (2012-04-17 12:37:47)  

## 2017-04-14 NOTE — Progress Notes (Signed)
[name] [phone]) [phone]   [phone]  OFFICE PROGRESS NOTE  [name], [name], MD [place] [address]  DIAGNOSIS: Metastatic breast adenocarcinoma presented with bone metastasis in the left hip area this was initially diagnosed as a stage II (T2, N1, M0) invasive right breast carcinoma with positive estrogen and progesterone receptor as well as HER-2/neu in July 2002  PRIOR THERAPY: 1. Status post right lumpectomy with right axillary lymph node dissection revealing 4/9 lymph nodes that were positive for malignancy.  2. Status post 8 cycles of systemic chemotherapy with CMF, completed March 2003.  3. Status post radiotherapy to the remaining right breast under the care of Dr. Tammi Klippel.  4. Status post 2 years of treatment with tamoxifen, started July 2003, then switched to Femara by Dr. Humphrey Rolls in August 2005. The patient discontinued the treatment in February 2009.  5. Status post left total hip replacement on Apr 01, 2011, secondary to pathologic left femoral neck fracture. 6. Zometa 4 mg IV on 3 monthly basis. Discontinued secondary to renal insufficiency.  CURRENT THERAPY: 1. Arimidex 1 mg by mouth daily started 03/18/2011 2. Xgeva 120 g subcutaneously every 3 months.  INTERVAL HISTORY: [name] [name] 76 y.o. female returns to the clinic today for follow-up visit. The patient is feeling fine today with no specific complaints except for fatigue and arthralgia. Her blood pressure is also high today because she was upset with the registration process. She denied having any chest pain, shortness of breath, cough or hemoptysis. She has no nausea, vomiting, diarrhea or constipation. She denied having any fever or chills. She was supposed to have repeat CT scan of the chest, abdomen and pelvis as well as whole body bone scan before this visit but unfortunately the scans are scheduled for tomorrow. She is here today for evaluation and  repeat blood work.    MEDICAL HISTORY: Past Medical History:  Diagnosis Date  . Anxiety    hx of  . Asthma   . Breast cancer ([place])    2002; metastatic in 2012, right breast, spread to left hip in 2012  . Cerebrovascular accident [facility])    R thalamic CVA 01/2010  . Dyslipidemia   . Hyperlipemia   . Hypertension   . Low back pain   . Osteoarthritis   . TIA (transient ischemic attack)    x2 in 2011  . Vertigo 2014    ALLERGIES:  has No Known Allergies.  MEDICATIONS:  Current Outpatient Prescriptions  Medication Sig Dispense Refill  . acetaminophen-codeine (TYLENOL #2) 300-15 MG tablet Take 1 tablet by mouth every 6 (six) hours as needed for severe pain. 90 tablet 3  . ALPRAZolam (XANAX) 0.5 MG tablet Take 1 tablet (0.5 mg total) by mouth 2 (two) times daily as needed for anxiety or sleep. 60 tablet 2  . amLODipine (NORVASC) 10 MG tablet Take 0.5 tablets (5 mg total) by mouth daily. 90 tablet 3  . anastrozole (ARIMIDEX) 1 MG tablet Take 1 tablet (1 mg total) by mouth daily. 90 tablet 2  . Cholecalciferol (VITAMIN D3) 1000 UNITS CAPS Take 1 capsule by mouth daily.     . diphenoxylate-atropine (LOMOTIL) 2.5-0.025 MG tablet Take 1 tablet by mouth 4 (four) times daily as needed for diarrhea or loose stools. 60 tablet 1  . docusate sodium (COLACE) 100 MG capsule Take 1 capsule (100 mg total) by mouth 2 (two) times daily. 10 capsule 0  . losartan-hydrochlorothiazide (HYZAAR)  100-25 MG tablet Take 1 tablet by mouth daily. 90 tablet 3  . meclizine (ANTIVERT) 12.5 MG tablet Take 1 tablet (12.5 mg total) by mouth 2 (two) times daily as needed for dizziness. 10 tablet 0  . pantoprazole (PROTONIX) 40 MG tablet Take 1 tablet (40 mg total) by mouth daily. 90 tablet 3  . polyethylene glycol (MIRALAX / GLYCOLAX) packet Take 17 g by mouth 2 (two) times daily. 14 each 0  . promethazine (PHENERGAN) 12.5 MG tablet Take 1 tablet (12.5 mg total) by mouth every 6 (six) hours as needed for nausea or  vomiting. 30 tablet 0  . rivaroxaban (XARELTO) 20 MG TABS tablet Take 1 tablet (20 mg total) by mouth daily. 30 tablet 11  . spironolactone (ALDACTONE) 50 MG tablet Take 1 tablet (50 mg total) by mouth daily. 90 tablet 3  . tiZANidine (ZANAFLEX) 4 MG tablet Take 1 tablet (4 mg total) by mouth every 6 (six) hours as needed for muscle spasms. 40 tablet 0  . triamcinolone ointment (KENALOG) 0.5 % Apply 1 application topically 3 (three) times daily. 30 g 2   No current facility-administered medications for this visit.    Facility-Administered Medications Ordered in Other Visits  Medication Dose Route Frequency Provider Last Rate Last Dose  . alteplase (CATHFLO ACTIVASE) injection 2 mg  2 mg Intracatheter Once PRN Curt Bears, MD      . denosumab (XGEVA) injection 120 mg  120 mg Subcutaneous Once Curt Bears, MD      . denosumab (XGEVA) injection 120 mg  120 mg Subcutaneous Once Curt Bears, MD        SURGICAL HISTORY:  Past Surgical History:  Procedure Laterality Date  . ABDOMINAL HYSTERECTOMY     complete  . BREAST LUMPECTOMY     Right breast 2002  . JOINT REPLACEMENT  2012   L hip due to breast ca met  . TOTAL HIP ARTHROPLASTY Right 10/28/2015   Procedure: RIGHT TOTAL HIP ARTHROPLASTY ANTERIOR APPROACH;  Surgeon: Paralee Cancel, MD;  Location: [facility];  Service: Orthopedics;  Laterality: Right;    REVIEW OF SYSTEMS:  Constitutional: positive for fatigue Eyes: negative Ears, nose, mouth, throat, and face: negative Respiratory: negative Cardiovascular: negative Gastrointestinal: negative Genitourinary:negative Integument/breast: negative Hematologic/lymphatic: negative Musculoskeletal:positive for arthralgias Neurological: negative Behavioral/Psych: negative Endocrine: negative Allergic/Immunologic: negative   PHYSICAL EXAMINATION: General appearance: alert, cooperative, fatigued and no distress Head: Normocephalic, without obvious abnormality, atraumatic Neck: no  adenopathy, no JVD, supple, symmetrical, trachea midline and thyroid not enlarged, symmetric, no tenderness/mass/nodules Lymph nodes: Cervical, supraclavicular, and axillary nodes normal. Resp: clear to auscultation bilaterally Back: symmetric, no curvature. ROM normal. No CVA tenderness. Cardio: regular rate and rhythm, S1, S2 normal, no murmur, click, rub or gallop GI: soft, non-tender; bowel sounds normal; no masses,  no organomegaly Extremities: extremities normal, atraumatic, no cyanosis or edema Neurologic: Alert and oriented X 3, normal strength and tone. Normal symmetric reflexes. Normal coordination and gait  ECOG PERFORMANCE STATUS: 1 - Symptomatic but completely ambulatory  Blood pressure (!) 167/79, pulse 82, temperature 98.1 F (36.7 C), temperature source Oral, resp. rate 18, height _0  (1.6 m), weight 227 lb 14.4 oz (103.4 kg), SpO2 100 %.  LABORATORY DATA: Lab Results  Component Value Date   WBC 6.1 04/12/2017   HGB 13.0 04/12/2017   HCT 39.9 04/12/2017   MCV 85.7 04/12/2017   PLT 264 04/12/2017      Chemistry      Component Value Date/Time   NA 140  04/12/2017 0813   K 4.1 04/12/2017 0813   CL 105 03/02/2016 0834   CL 109 (H) 04/25/2013 1023   CO2 23 04/12/2017 0813   BUN 9.3 04/12/2017 0813   CREATININE 1.1 04/12/2017 0813      Component Value Date/Time   CALCIUM 9.4 04/12/2017 0813   ALKPHOS 108 04/12/2017 0813   AST 20 04/12/2017 0813   ALT 13 04/12/2017 0813   BILITOT 0.82 04/12/2017 0813       RADIOGRAPHIC STUDIES: No results found.  ASSESSMENT AND PLAN:  This is a very pleasant 76 years old African-American female with metastatic breast carcinoma with bone metastasis. The patient has been on treatment with oral Arimidex 1 mg by mouth daily started 03/18/2011. She has been tolerating this treatment fairly well. Unfortunately her tumor marker CA 27.29 has been rising over the last few months. This is concerning for disease progression The  patient is scheduled for repeat CT scan of the chest, abdomen and pelvis as well as bone scan tomorrow. Her scan showed evidence for disease progression, I would consider adding Ibrance to her treatment with Arimidex. For hypertension, I strongly advised the patient to monitor her blood pressure closely at home and to take her blood pressure medication as prescribed. For the metastatic bone disease, she will continue her current treatment with Xgeva every 3 months. I will see her back for follow-up visit in 3 months with repeat blood work or sooner if we started her on treatment with Ibrance. She was advised to call immediately if she has any concerning symptoms in the interval. The patient voices understanding of current disease status and treatment options and is in agreement with the current care plan. All questions were answered. The patient knows to call the clinic with any problems, questions or concerns. We can certainly see the patient much sooner if necessary.  Disclaimer: This note was dictated with voice recognition software. Similar sounding words can inadvertently be transcribed and may not be corrected upon review.

## 2017-04-14 NOTE — Telephone Encounter (Signed)
Scheduled appt per 6/14 los - Gave patient AVS and calender.  

## 2017-04-15 ENCOUNTER — Ambulatory Visit (HOSPITAL_COMMUNITY)
Admission: RE | Admit: 2017-04-15 | Discharge: 2017-04-15 | Disposition: A | Discharge status: Home / Self Care | Payer: Medicare Other | Source: Ambulatory Visit | Attending: Internal Medicine | Admitting: Internal Medicine

## 2017-04-15 ENCOUNTER — Encounter (HOSPITAL_COMMUNITY)
Admission: RE | Admit: 2017-04-15 | Discharge: 2017-04-15 | Disposition: A | Discharge status: Home / Self Care | Payer: Medicare Other | Source: Ambulatory Visit | Attending: Internal Medicine | Admitting: Internal Medicine

## 2017-04-15 ENCOUNTER — Telehealth: Payer: Self-pay | Admitting: Internal Medicine

## 2017-04-15 DIAGNOSIS — R918 Other nonspecific abnormal finding of lung field: Secondary | ICD-10-CM | POA: Diagnosis not present

## 2017-04-15 DIAGNOSIS — C7951 Secondary malignant neoplasm of bone: Secondary | ICD-10-CM | POA: Insufficient documentation

## 2017-04-15 DIAGNOSIS — C50911 Malignant neoplasm of unspecified site of right female breast: Secondary | ICD-10-CM | POA: Diagnosis present

## 2017-04-15 MED ORDER — TECHNETIUM TC 99M MEDRONATE IV KIT
25.0000 | PACK | Freq: Once | INTRAVENOUS | Status: AC | PRN
  Administered 2017-04-15: 19 via INTRAVENOUS

## 2017-04-15 MED ORDER — IOPAMIDOL (ISOVUE-300) INJECTION 61%
INTRAVENOUS | Status: AC
  Filled 2017-04-15: qty 100

## 2017-04-15 MED ORDER — IOPAMIDOL (ISOVUE-300) INJECTION 61%
100.0000 mL | Freq: Once | INTRAVENOUS | Status: AC | PRN
  Administered 2017-04-15: 100 mL via INTRAVENOUS

## 2017-04-15 NOTE — Telephone Encounter (Signed)
Confirmed 6/19 appt at 1315 per sch msg

## 2017-04-19 ENCOUNTER — Other Ambulatory Visit: Payer: Medicare Other

## 2017-04-19 ENCOUNTER — Encounter: Payer: Self-pay | Admitting: Internal Medicine

## 2017-04-19 ENCOUNTER — Ambulatory Visit (HOSPITAL_BASED_OUTPATIENT_CLINIC_OR_DEPARTMENT_OTHER): Payer: Medicare Other | Admitting: Internal Medicine

## 2017-04-19 ENCOUNTER — Encounter: Payer: Self-pay | Admitting: Radiation Oncology

## 2017-04-19 ENCOUNTER — Telehealth: Payer: Self-pay | Admitting: Internal Medicine

## 2017-04-19 VITALS — BP 152/59 | HR 88 | Temp 98.7°F | Resp 19 | Ht 63.0 in | Wt 227.2 lb

## 2017-04-19 DIAGNOSIS — R911 Solitary pulmonary nodule: Secondary | ICD-10-CM | POA: Diagnosis not present

## 2017-04-19 DIAGNOSIS — C7951 Secondary malignant neoplasm of bone: Secondary | ICD-10-CM | POA: Diagnosis not present

## 2017-04-19 DIAGNOSIS — Z7189 Other specified counseling: Secondary | ICD-10-CM

## 2017-04-19 DIAGNOSIS — M545 Low back pain: Secondary | ICD-10-CM

## 2017-04-19 DIAGNOSIS — C50911 Malignant neoplasm of unspecified site of right female breast: Secondary | ICD-10-CM

## 2017-04-19 DIAGNOSIS — Z5181 Encounter for therapeutic drug level monitoring: Secondary | ICD-10-CM

## 2017-04-19 HISTORY — DX: Other specified counseling: Z71.89

## 2017-04-19 MED ORDER — PALBOCICLIB 125 MG PO CAPS: 125.0000 mg | ORAL_CAPSULE | Freq: Every day | ORAL | 2 refills | Status: DC

## 2017-04-19 MED ORDER — ANASTROZOLE 1 MG PO TABS: 1.0000 mg | ORAL_TABLET | Freq: Every day | ORAL | 2 refills | Status: DC

## 2017-04-19 MED FILL — ANASTROZOLE 1 MG TABLET: 1 | 90 days supply | Qty: 90 | Fill #0

## 2017-04-19 NOTE — Telephone Encounter (Signed)
Appointments scheduled per 04/19/17 los. Patient was given a copy of the AVS report and appointment schedule per 04/19/17 los.

## 2017-04-19 NOTE — Progress Notes (Signed)
[name] [name]   [phone]  OFFICE PROGRESS NOTE  [name], [name], MD [name] [name] [id number]  DIAGNOSIS: Metastatic breast adenocarcinoma presented with bone metastasis in the left hip area this was initially diagnosed as a stage II (T2, N1, M0) invasive right breast carcinoma with positive estrogen and progesterone receptor as well as HER-2/neu in July 2002  PRIOR THERAPY: 1. Status post right lumpectomy with right axillary lymph node dissection revealing 4/9 lymph nodes that were positive for malignancy.  2. Status post 8 cycles of systemic chemotherapy with CMF, completed March 2003.  3. Status post radiotherapy to the remaining right breast under the care of Dr. Tammi [name].  4. Status post 2 years of treatment with tamoxifen, started July 2003, then switched to Femara by Dr. Humphrey [name] in August 2005. The patient discontinued the treatment in February 2009.  5. Status post left total hip replacement on Apr 01, 2011, secondary to pathologic left femoral neck fracture. 6. Zometa 4 mg IV on 3 monthly basis. Discontinued secondary to renal insufficiency.  CURRENT THERAPY: 1. Arimidex 1 mg by mouth daily started 03/18/2011 2. Xgeva 120 g subcutaneously every 3 months.  INTERVAL HISTORY: [name] [name] 76 y.o. female returns to the clinic today for follow-up visit accompanied by a friend. The patient is feeling fine today with no specific complaints except for intermittent pain in the middle of the back. She denied having any chest pain, shortness breath, cough or hemoptysis. She denied having any fever or chills. She has no nausea, vomiting, diarrhea or constipation. She has no recent weight loss or night sweats. She had repeat CT scan of the chest, abdomen and pelvis as well as bone scan and she is here today for evaluation and discussion of her treatment options.  MEDICAL HISTORY: Past Medical History:  Diagnosis Date  .  Anxiety    hx of  . Asthma   . Breast cancer ([name] [name])    2002; metastatic in 2012, right breast, spread to left hip in 2012  . Cerebrovascular accident [name] [name])    R thalamic CVA 01/2010  . Dyslipidemia   . Encounter for therapeutic drug monitoring 04/14/2017  . Hyperlipemia   . Hypertension   . Low back pain   . Osteoarthritis   . TIA (transient ischemic attack)    x2 in 2011  . Vertigo 2014    ALLERGIES:  has No Known Allergies.  MEDICATIONS:  Current Outpatient Prescriptions  Medication Sig Dispense Refill  . ALPRAZolam (XANAX) 0.5 MG tablet Take 1 tablet (0.5 mg total) by mouth 2 (two) times daily as needed for anxiety or sleep. 60 tablet 2  . amLODipine (NORVASC) 10 MG tablet Take 0.5 tablets (5 mg total) by mouth daily. 90 tablet 3  . anastrozole (ARIMIDEX) 1 MG tablet Take 1 tablet (1 mg total) by mouth daily. 90 tablet 2  . Cholecalciferol (VITAMIN D3) 1000 UNITS CAPS Take 1 capsule by mouth daily.     [name] [name] losartan-hydrochlorothiazide (HYZAAR) 100-25 MG tablet Take 1 tablet by mouth daily. 90 tablet 3  . meclizine (ANTIVERT) 12.5 MG tablet Take 1 tablet (12.5 mg total) by mouth 2 (two) times daily as needed for dizziness. 10 tablet 0  . spironolactone (ALDACTONE) 50 MG tablet Take 1 tablet (50 mg total) by mouth daily. 90 tablet 3  . diphenoxylate-atropine (LOMOTIL) 2.5-0.025 MG tablet Take 1 tablet by mouth 4 (four) times daily as needed for diarrhea or loose  stools. (Patient not taking: Reported on 04/19/2017) 60 tablet 1  . pantoprazole (PROTONIX) 40 MG tablet Take 1 tablet (40 mg total) by mouth daily. (Patient not taking: Reported on 04/19/2017) 90 tablet 3  . polyethylene glycol (MIRALAX / GLYCOLAX) packet Take 17 g by mouth 2 (two) times daily. (Patient not taking: Reported on 04/19/2017) 14 each 0  . rivaroxaban (XARELTO) 20 MG TABS tablet Take 1 tablet (20 mg total) by mouth daily. 30 tablet 11  . triamcinolone ointment (KENALOG) 0.5 % Apply 1 application topically 3 (three)  times daily. (Patient not taking: Reported on 04/19/2017) 30 g 2   No current facility-administered medications for this visit.    Facility-Administered Medications Ordered in Other Visits  Medication Dose Route Frequency Provider Last Rate Last Dose  . alteplase (CATHFLO ACTIVASE) injection 2 mg  2 mg Intracatheter Once PRN [name] Bears, MD      . denosumab (XGEVA) injection 120 mg  120 mg Subcutaneous Once [name] Bears, MD        SURGICAL HISTORY:  Past Surgical History:  Procedure Laterality Date  . ABDOMINAL HYSTERECTOMY     complete  . BREAST LUMPECTOMY     Right breast 2002  . JOINT REPLACEMENT  2012   L hip due to breast ca met  . TOTAL HIP ARTHROPLASTY Right 10/28/2015   Procedure: RIGHT TOTAL HIP ARTHROPLASTY ANTERIOR APPROACH;  Surgeon: [name] Cancel, MD;  Location: [facility];  Service: Orthopedics;  Laterality: Right;    REVIEW OF SYSTEMS:  Constitutional: positive for fatigue Eyes: negative Ears, nose, mouth, throat, and face: negative Respiratory: negative Cardiovascular: negative Gastrointestinal: negative Genitourinary:negative Integument/breast: negative Hematologic/lymphatic: negative Musculoskeletal:positive for arthralgias and back pain Neurological: negative Behavioral/Psych: negative Endocrine: negative Allergic/Immunologic: negative   PHYSICAL EXAMINATION: General appearance: alert, cooperative, fatigued and no distress Head: Normocephalic, without obvious abnormality, atraumatic Neck: no adenopathy, no JVD, supple, symmetrical, trachea midline and thyroid not enlarged, symmetric, no tenderness/mass/nodules Lymph nodes: Cervical, supraclavicular, and axillary nodes normal. Resp: clear to auscultation bilaterally Back: symmetric, no curvature. ROM normal. No CVA tenderness. Cardio: regular rate and rhythm, S1, S2 normal, no murmur, click, rub or gallop GI: soft, non-tender; bowel sounds normal; no masses,  no organomegaly Extremities: extremities  normal, atraumatic, no cyanosis or edema Neurologic: Alert and oriented X 3, normal strength and tone. Normal symmetric reflexes. Normal coordination and gait  ECOG PERFORMANCE STATUS: 1 - Symptomatic but completely ambulatory  Blood pressure (!) 152/59, pulse 88, temperature 98.7 F (37.1 C), temperature source Oral, resp. rate 19, height 5' 3" (1.6 m), weight 227 lb 3.2 oz (103.1 kg), SpO2 100 %.  LABORATORY DATA: Lab Results  Component Value Date   WBC 6.1 04/12/2017   HGB 13.0 04/12/2017   HCT 39.9 04/12/2017   MCV 85.7 04/12/2017   PLT 264 04/12/2017      Chemistry      Component Value Date/Time   NA 140 04/12/2017 0813   K 4.1 04/12/2017 0813   CL 105 03/02/2016 0834   CL 109 (H) 04/25/2013 1023   CO2 23 04/12/2017 0813   BUN 9.3 04/12/2017 0813   CREATININE 1.1 04/12/2017 0813      Component Value Date/Time   CALCIUM 9.4 04/12/2017 0813   ALKPHOS 108 04/12/2017 0813   AST 20 04/12/2017 0813   ALT 13 04/12/2017 0813   BILITOT 0.82 04/12/2017 0813       RADIOGRAPHIC STUDIES: Ct Chest W Contrast  Result Date: 04/15/2017 CLINICAL DATA:  Metastatic breast cancer with  bone metastases, status post right lumpectomy and right axillary lymph node dissection, status post radiation therapy EXAM: CT CHEST, ABDOMEN, AND PELVIS WITH CONTRAST TECHNIQUE: Multidetector CT imaging of the chest, abdomen and pelvis was performed following the standard protocol during bolus administration of intravenous contrast. CONTRAST:  136m ISOVUE-300 IOPAMIDOL (ISOVUE-300) INJECTION 61% COMPARISON:  Whole-body bone scan and CT chest abdomen pelvis dated 12/29/2015 FINDINGS: CT CHEST FINDINGS Cardiovascular: The heart is normal in size. No pericardial effusion. Coronary atherosclerosis the LAD. Mild ectasia of the ascending thoracic aorta, measuring 3.4 cm. Atherosclerotic calcifications aortic arch. Left chest port terminates at the cavoatrial junction. Mediastinum/Nodes: Small mediastinal lymph  nodes, including an 8 mm short axis right paratracheal node and a 9 mm short axis subcarinal node. 13 mm short axis right hilar node (series 2/ image 24), mildly enlarged. Status post right axillary lymph node dissection. Visualized left thyroid is mildly nodular. Lungs/Pleura: Numerous bilateral pulmonary nodules/metastases, new, many of which are subpleural. For example: --11 mm irregular nodule in the anterior left upper lobe (series 6/ image 40) --12 mm subpleural nodule in the anterior right lower lobe along the major fissure (series 6/image 71) --13 mm subpleural nodule in the anterior left lower lobe along the fissure (series 6/image 72) --12 mm subpleural nodule in the posterior left lower lobe (series 6/ image 100) Mild biapical pleural-parenchymal scarring. No focal consolidation. No pleural effusion or pneumothorax. Musculoskeletal: Postsurgical changes related to right breast lumpectomy in the inner right breast (series 2/ image 16). Overlying skin thickening, reflecting radiation changes. Sclerotic lesion involving the T10 vertebral body (sagittal image 79), new, suspicious for metastasis. Stable sclerotic lesion at T9 (series 2/image 31). Degenerative changes of the thoracic spine. CT ABDOMEN PELVIS FINDINGS Hepatobiliary: Liver is within normal limits. Cholelithiasis, without associated inflammatory changes. Pancreas: Within normal limits. Spleen: Within normal limits. Adrenals/Urinary Tract: Adrenal glands are within normal limits. Kidneys are within normal limits.  No hydronephrosis. Bladder is within normal limits, although partially obscured by streak artifact. Stomach/Bowel: Stomach is notable for a small hiatal hernia. No evidence of bowel obstruction. Normal appendix (series 2/ image 84). Sigmoid diverticulosis, with mild mucosal hypertrophy (series 2/ image 95), but without associated inflammatory changes. Vascular/Lymphatic: No evidence of abdominal aortic aneurysm. Atherosclerotic  calcifications of the abdominal aorta and branch vessels. No suspicious abdominopelvic lymphadenopathy. Reproductive: Suspected prior hysterectomy, although poorly evaluated due to streak artifact. No adnexal masses. Other: No abdominopelvic ascites. Musculoskeletal: Stable sclerotic lesion involving the L3 vertebral body (sagittal image 73). Degenerative changes of the lumbar spine. Bilateral hip arthroplasties, without evidence of complication. IMPRESSION: Status post right breast lumpectomy with right axillary lymph node dissection. Numerous bilateral pulmonary nodules measuring up to 13 mm, new, compatible with widespread pulmonary metastases. 13 mm short axis right hilar node, metastasis not excluded. New sclerotic lesion involving the T10 vertebral body, suspicious for metastasis. Additional stable sclerotic lesions at T9 and L3. Correlate with pending bone scan. Additional ancillary findings as above. Electronically Signed   By: SJulian HyM.D.   On: 04/15/2017 15:14   Nm Bone Scan Whole Body  Result Date: 04/15/2017 CLINICAL DATA:  76year old female with breast cancer. New widespread pulmonary metastases, and new sclerotic T10 vertebral lesion on restaging chest abdomen and pelvis CT today. EXAM: NUCLEAR MEDICINE WHOLE BODY BONE SCAN TECHNIQUE: Whole body anterior and posterior images were obtained approximately 3 hours after intravenous injection of radiopharmaceutical. RADIOPHARMACEUTICALS:  19.0 mCi Technetium-946mDP IV COMPARISON:  CT chest abdomen and pelvis today reported separately. Whole-body  bone scan 12/29/2015. FINDINGS: Large body habitus. Expected radiotracer activity in both kidneys in the urinary bladder. A new round focus of moderate to intense radiotracer activity in the lower thoracic spine appears to correspond to the T10 level sclerotic lesion seen by CT today. Other spinal radiotracer activity is stable is at the at and has been felt related to benign degenerative etiology.  This includes a chronic prominent left paraspinal focus of activity at the lower lumbar spine where a bulky left lumbosacral junction facet arthropathy is noted by CT. Chronic radiotracer activity at both sternoclavicular joints also appears stable and degenerative in nature. No other suspicious radiotracer activity identified in the axial skeleton. Stable degenerative appearing radiotracer activity in the visible appendicular skeleton. IMPRESSION: 1. Positive scintigraphic correlation to the sclerotic T10 vertebral body lesion which is most compatible with an osseous metastasis. 2. Otherwise stable since 2017, with no other scintigraphic findings specific for metastatic disease to bone. Electronically Signed   By: Genevie Ann M.D.   On: 04/15/2017 16:03   Ct Abdomen Pelvis W Contrast  Result Date: 04/15/2017 CLINICAL DATA:  Metastatic breast cancer with bone metastases, status post right lumpectomy and right axillary lymph node dissection, status post radiation therapy EXAM: CT CHEST, ABDOMEN, AND PELVIS WITH CONTRAST TECHNIQUE: Multidetector CT imaging of the chest, abdomen and pelvis was performed following the standard protocol during bolus administration of intravenous contrast. CONTRAST:  139m ISOVUE-300 IOPAMIDOL (ISOVUE-300) INJECTION 61% COMPARISON:  Whole-body bone scan and CT chest abdomen pelvis dated 12/29/2015 FINDINGS: CT CHEST FINDINGS Cardiovascular: The heart is normal in size. No pericardial effusion. Coronary atherosclerosis the LAD. Mild ectasia of the ascending thoracic aorta, measuring 3.4 cm. Atherosclerotic calcifications aortic arch. Left chest port terminates at the cavoatrial junction. Mediastinum/Nodes: Small mediastinal lymph nodes, including an 8 mm short axis right paratracheal node and a 9 mm short axis subcarinal node. 13 mm short axis right hilar node (series 2/ image 24), mildly enlarged. Status post right axillary lymph node dissection. Visualized left thyroid is mildly nodular.  Lungs/Pleura: Numerous bilateral pulmonary nodules/metastases, new, many of which are subpleural. For example: --11 mm irregular nodule in the anterior left upper lobe (series 6/ image 40) --12 mm subpleural nodule in the anterior right lower lobe along the major fissure (series 6/image 71) --13 mm subpleural nodule in the anterior left lower lobe along the fissure (series 6/image 72) --12 mm subpleural nodule in the posterior left lower lobe (series 6/ image 100) Mild biapical pleural-parenchymal scarring. No focal consolidation. No pleural effusion or pneumothorax. Musculoskeletal: Postsurgical changes related to right breast lumpectomy in the inner right breast (series 2/ image 16). Overlying skin thickening, reflecting radiation changes. Sclerotic lesion involving the T10 vertebral body (sagittal image 79), new, suspicious for metastasis. Stable sclerotic lesion at T9 (series 2/image 31). Degenerative changes of the thoracic spine. CT ABDOMEN PELVIS FINDINGS Hepatobiliary: Liver is within normal limits. Cholelithiasis, without associated inflammatory changes. Pancreas: Within normal limits. Spleen: Within normal limits. Adrenals/Urinary Tract: Adrenal glands are within normal limits. Kidneys are within normal limits.  No hydronephrosis. Bladder is within normal limits, although partially obscured by streak artifact. Stomach/Bowel: Stomach is notable for a small hiatal hernia. No evidence of bowel obstruction. Normal appendix (series 2/ image 84). Sigmoid diverticulosis, with mild mucosal hypertrophy (series 2/ image 95), but without associated inflammatory changes. Vascular/Lymphatic: No evidence of abdominal aortic aneurysm. Atherosclerotic calcifications of the abdominal aorta and branch vessels. No suspicious abdominopelvic lymphadenopathy. Reproductive: Suspected prior hysterectomy, although poorly evaluated due to  streak artifact. No adnexal masses. Other: No abdominopelvic ascites. Musculoskeletal: Stable  sclerotic lesion involving the L3 vertebral body (sagittal image 73). Degenerative changes of the lumbar spine. Bilateral hip arthroplasties, without evidence of complication. IMPRESSION: Status post right breast lumpectomy with right axillary lymph node dissection. Numerous bilateral pulmonary nodules measuring up to 13 mm, new, compatible with widespread pulmonary metastases. 13 mm short axis right hilar node, metastasis not excluded. New sclerotic lesion involving the T10 vertebral body, suspicious for metastasis. Additional stable sclerotic lesions at T9 and L3. Correlate with pending bone scan. Additional ancillary findings as above. Electronically Signed   By: Julian Hy M.D.   On: 04/15/2017 15:14    ASSESSMENT AND PLAN:  This is a very pleasant 76 years old African-American female with metastatic breast carcinoma status post treatment with Arimidex 1 mg by mouth daily since May 2012 She had recent imaging studies including CT scan of the chest, abdomen and pelvis as well as bone scan. I personally and independently reviewed the scan images and discuss the results with the patient. Unfortunately her scan showed evidence for disease progression with new multiple pulmonary nodules in addition to metastatic disease to the T10 vertebra. I discussed the treatment options with the patient including palliative care versus continuation of treatment with Arimidex but adding Ibrance 125 mg by mouth daily for 21 days every 4 weeks. I discussed with the patient adverse effect of this treatment. I will also ask the pharmacist for oral target therapy to discuss with the patient this treatment and the adverse effects. I will monitor her Closely every 2 weeks for the first 2 months. I would see the patient back for follow-up visit in one month's for evaluation and repeat blood work. For the metastatic bone disease at T10, I will refer the patient to Dr. Tammi [name] for consideration of palliative  radiotherapy. She was advised to call immediately if she has any concerning symptoms in the interval. The patient voices understanding of current disease status and treatment options and is in agreement with the current care plan. All questions were answered. The patient knows to call the clinic with any problems, questions or concerns. We can certainly see the patient much sooner if necessary.  Disclaimer: This note was dictated with voice recognition software. Similar sounding words can inadvertently be transcribed and may not be corrected upon review.

## 2017-04-22 ENCOUNTER — Telehealth: Payer: Self-pay | Admitting: Pharmacy Technician

## 2017-04-22 ENCOUNTER — Telehealth: Payer: Self-pay | Admitting: Pharmacist

## 2017-04-22 MED FILL — IBRANCE 125 MG CAPSULE: 125 | 21 days supply | Qty: 21 | Fill #0

## 2017-04-22 NOTE — Telephone Encounter (Signed)
Oral Oncology Patient Advocate Encounter  Received notification from [facility] that prior authorization for [facility] is required.  PA submitted on CoverMyMeds Key [id number] Status is pending.  This encounter will be updated with status updates.    Oral Oncology Clinic will continue to follow.  Fabio Asa. [name], [facility] Patient Advocate [phone] 04/22/2017 1:15 PM

## 2017-04-22 NOTE — Telephone Encounter (Signed)
Oral Chemotherapy Pharmacist Encounter  Received notification that MD intends to start patient on Ibrance for the treatment of metastatic hormone-receptor positive breast cancer with concurrent Arimidex.  Labs from 6/12 reviewed, OK for treatment  Current medication list in Epic assessed,   Ibrance and amlodipine, Ibrance and Xanax: Category C interactions: Ibrance may increase serum exposure to amlodipine and Xanax through inhibition of CYP3A4. BPs in Epic reviewed, no change to current BP therapy indicated at this time. BPs will be monitored. Patient will be counseled to be aware of increased somnolence with Xanax.  I spoke with patient for overview of new oral chemotherapy medication: Ibrance. Pt is doing well. The prescriptions have been sent to the [facility] for benefit analysis and approval.   Counseled patient on administration, dosing, side effects, safe handling, and monitoring. Patient will take Ibrance 125mg  capsule by mouth once daily with breakfast for 21 days on and 7 days off, every 28 days. Patient will continue to take Arimidex once daily. Patient knows to avoid grapefruit and grapefruit juice while on treatment with Ibrance.  Side effects include but not limited to: fatigue, GI upset, decreased blood counts, and cough.  Ms. Ancheta voiced understanding and appreciation.   Patient stated she feels very fatigued most days when she stays at home, but has more energy when out and about. Patient thinks this is due to SOB experienced with increasing pulmonary nodules and back pain due to bone metastases. Patient is hopeful some of these symptoms will dissipate once started on treatment with the Ibrance.  All questions answered.  Will follow up with patient regarding insurance and pharmacy.   We will use free voucher for 1st Ibrance fill, the pharmacy will call patient today (6/22) or Monday (6/25) to arrange shipment to the patient.  Oral Oncology Clinic will  continue to follow.  Thank you,  Johny Drilling, PharmD, BCPS, BCOP 04/22/2017  9:17 AM Oral Oncology Clinic [phone]

## 2017-04-22 NOTE — Telephone Encounter (Signed)
Oral Oncology Patient Advocate Encounter  Prior Authorization for [name] [name] has been approved by [employer].   PA # [id number] Effective Dates: 62/22/2018 through 04/22/2018  Patient's initial fill was covered by a free voucher.  [name] [name] has made arrangements with the patient for the medication to be mailed to her [name].  It should arrive on Monday 6/24.    Her subsequent copay for [name] [name] will be $2944.82.  There is currently no grant funding available.  I will reach out to [name] [name] and begin the process for manufacturer assistance.   [name] [name], CPhT, [place] [facility] Patient Advocate [phone] 04/22/2017 1:35 PM

## 2017-05-02 ENCOUNTER — Telehealth: Payer: Self-pay | Admitting: Internal Medicine

## 2017-05-02 ENCOUNTER — Ambulatory Visit
Admission: RE | Admit: 2017-05-02 | Discharge: 2017-05-02 | Disposition: A | Discharge status: Home / Self Care | Payer: Medicare Other | Source: Ambulatory Visit | Attending: Radiation Oncology | Admitting: Radiation Oncology

## 2017-05-02 ENCOUNTER — Encounter: Payer: Self-pay | Admitting: Radiation Oncology

## 2017-05-02 ENCOUNTER — Telehealth: Payer: Self-pay | Admitting: Pharmacy Technician

## 2017-05-02 VITALS — BP 146/70 | HR 88 | Resp 20 | Ht 63.0 in | Wt 229.0 lb

## 2017-05-02 DIAGNOSIS — C7952 Secondary malignant neoplasm of bone marrow: Secondary | ICD-10-CM | POA: Insufficient documentation

## 2017-05-02 DIAGNOSIS — E7210 Disorders of sulfur-bearing amino-acid metabolism, unspecified: Secondary | ICD-10-CM

## 2017-05-02 DIAGNOSIS — Z79811 Long term (current) use of aromatase inhibitors: Secondary | ICD-10-CM | POA: Diagnosis not present

## 2017-05-02 DIAGNOSIS — Z853 Personal history of malignant neoplasm of breast: Secondary | ICD-10-CM | POA: Diagnosis not present

## 2017-05-02 DIAGNOSIS — Z8673 Personal history of transient ischemic attack (TIA), and cerebral infarction without residual deficits: Secondary | ICD-10-CM | POA: Diagnosis not present

## 2017-05-02 DIAGNOSIS — C7951 Secondary malignant neoplasm of bone: Secondary | ICD-10-CM | POA: Insufficient documentation

## 2017-05-02 DIAGNOSIS — R600 Localized edema: Secondary | ICD-10-CM | POA: Diagnosis not present

## 2017-05-02 DIAGNOSIS — Z7901 Long term (current) use of anticoagulants: Secondary | ICD-10-CM | POA: Insufficient documentation

## 2017-05-02 DIAGNOSIS — R0602 Shortness of breath: Secondary | ICD-10-CM

## 2017-05-02 DIAGNOSIS — J45909 Unspecified asthma, uncomplicated: Secondary | ICD-10-CM | POA: Insufficient documentation

## 2017-05-02 DIAGNOSIS — M545 Low back pain: Secondary | ICD-10-CM

## 2017-05-02 DIAGNOSIS — E785 Hyperlipidemia, unspecified: Secondary | ICD-10-CM | POA: Diagnosis not present

## 2017-05-02 DIAGNOSIS — F419 Anxiety disorder, unspecified: Secondary | ICD-10-CM | POA: Insufficient documentation

## 2017-05-02 DIAGNOSIS — Z87891 Personal history of nicotine dependence: Secondary | ICD-10-CM | POA: Diagnosis not present

## 2017-05-02 DIAGNOSIS — I10 Essential (primary) hypertension: Secondary | ICD-10-CM | POA: Diagnosis not present

## 2017-05-02 DIAGNOSIS — C50911 Malignant neoplasm of unspecified site of right female breast: Secondary | ICD-10-CM

## 2017-05-02 NOTE — Progress Notes (Signed)
Histology and Location of Primary Cancer:  Metastatic breast adenocarcinoma presented with bone metastasis in the left hip area this was initially diagnosed as a stage II (T2, N1, M0) invasive right breast carcinoma with positive estrogen and progesterone receptor as well as HER-2/neu in July 2002  Sites of Visceral and Bony Metastatic Disease: lung and bone  Location(s) of Symptomatic Metastases: T10  Past/Anticipated chemotherapy by medical oncology, if any:  PRIOR THERAPY: 1. Status post right lumpectomy with right axillary lymph node dissection revealing 4/9 lymph nodes that were positive for malignancy.  2. Status post 8 cycles of systemic chemotherapy with CMF, completed March 2003.  3. Status post radiotherapy to the remaining right breast under the care of Dr. Tammi Klippel.  4. Status post 2 years of treatment with tamoxifen, started July 2003, then switched to Femara by Dr. Humphrey Rolls in August 2005. The patient discontinued the treatment in February 2009.  5. Status post left total hip replacement on Apr 01, 2011, secondary to pathologic left femoral neck fracture. 6. Zometa 4 mg IV on 3 monthly basis. Discontinued secondary to renal insufficiency.  CURRENT THERAPY: 1. Arimidex 1 mg by mouth daily started 03/18/2011 2. Xgeva 120 g subcutaneously every 3 months. 3. Ibrance 125 mg. Started Tuesday, June 26th   Pain on a scale of 0-10 is: Constant pain in the middle of her back. Reports taking OTC back and body. Zanaflex is on one of the patient's many medication lists. Patient reports she isn't taking this medication it makes her "blah for two days."   If Spine Met(s), symptoms, if any, include:  Bowel/Bladder retention or incontinence (please describe): Denies nausea, vomiting, diarrhea or constipatin. Reports occasional urinary leakage due to restricted mobility.   Numbness or weakness in extremities (please describe): Intermittent numbness and tingling in right hand.  Current  Decadron regimen, if applicable: No  Ambulatory status? Walker? Wheelchair?: Ambulatory  SAFETY ISSUES:  Prior radiation? yes  Pacemaker/ICD? no  Possible current pregnancy? no  Is the patient on methotrexate? no  Current Complaints / other details:  76 year old female. Reports shortness of breath. Denies cough. Patient lives alone. Patient has a power port.

## 2017-05-02 NOTE — Progress Notes (Signed)
Radiation Oncology         [phone] ________________________________  Initial outpatient Consultation  Name: [name] [name] MRN: [id number]  Date: 05/02/2017  DOB: [birth year]  FE:[id number], [name], MD  Curt Bears, MD   REFERRING PHYSICIAN: Curt Bears, MD  DIAGNOSIS:  The encounter diagnosis was Secondary malignant neoplasm of bone and bone marrow ([place]).    ICD-10-CM   1. Secondary malignant neoplasm of bone and bone marrow (HCC) C79.51    C79.52     HISTORY OF PRESENT ILLNESS: [name] [name] is a 76 y.o. female seen at the request of Dr. Julien Nordmann for a history of metastatic breast adenocarcinoma which presented with bone metastasis in the left hip area. This was initially diagnosed as a stage II (T2, N1, M0) invasive right breast carcinoma with positive estrogen and progesterone receptor as well as HER-2/neu in July 2002. She is currently taking Arimidex 1 mg by mough daily started 03/18/2011, Xgeva 120 g subcutaneously every 3 months, and Ibrace 125 mg started Tuesday, 04/26/2017.  Prior therapy includes:  1. Status post right lumpectomy with right axillary lymph node dissection revealing 4/9 lymph nodes that were positive for malignancy. 2. Status post 8 cycles of systemic chemotherapy with CMF, completed March 2003. 3. Status post radiotherapy to the remaining right breast under the care of Dr. Tammi Klippel. 4. Status post 2 years of treatment with tamoxifen, started July 2003, then switched to Femara by Dr. Humphrey Rolls in August 2005. The patient discontinued the treatment in February 2009. 5. Status post left total hip replacement on Apr 01, 2011, secondary to pathologic left femoral neck fracture. 6. Zometa 4 mg IV on 3 monthly basis. Discontinued secondary to renal insufficiency.  Recent CT C/A/P on 04/15/2017 showed new sclerotic lesion involving the T10 vertebral body, suspicious for metastasis. As well as additional stable sclerotic lesions at T9 and L3. There are  numerous bilateral pulmonary nodules measuring up to 13 mm, new, compatible with widespread pulmonary metastases. There is a 13 mm short axis right hilar node, metastasis not excluded. Accordingly, a bone scan was performed at that time which showed positive scintigraphic correlation to the sclerotic T10 vertebral body lesion which is most compatible with an osseous metastasis.   The patient comes to discuss palliative radiation treatment options in the management of sclerotic T10 vertebral body.   PREVIOUS RADIATION THERAPY:   2003 the patient completed radiotherapy to right breast and regional nodes.   PAST MEDICAL HISTORY:  Past Medical History:  Diagnosis Date  . Anxiety    hx of  . Asthma   . Breast cancer ([place])    2002; metastatic in 2012, right breast, spread to left hip in 2012  . Cerebrovascular accident [facility])    R thalamic CVA 01/2010  . Dyslipidemia   . Encounter for therapeutic drug monitoring 04/14/2017  . Goals of care, counseling/discussion 04/19/2017  . Hyperlipemia   . Hypertension   . Low back pain   . Osteoarthritis   . TIA (transient ischemic attack)    x2 in 2011  . Vertigo 2014      PAST SURGICAL HISTORY: Past Surgical History:  Procedure Laterality Date  . ABDOMINAL HYSTERECTOMY     complete  . BREAST LUMPECTOMY     Right breast 2002  . JOINT REPLACEMENT  2012   L hip due to breast ca met  . TOTAL HIP ARTHROPLASTY Right 10/28/2015   Procedure: RIGHT TOTAL HIP ARTHROPLASTY ANTERIOR APPROACH;  Surgeon: Paralee Cancel, MD;  Location:  [facility];  Service: Orthopedics;  Laterality: Right;    FAMILY HISTORY:  Family History  Problem Relation Age of Onset  . Hypertension Mother   . Hypertension Father   . Cancer Neg Hx     SOCIAL HISTORY:  Social History   Social History  . Marital status: Divorced    Spouse name: N/A  . Number of children: N/A  . Years of education: N/A   Occupational History  . Not on file.   Social History Main Topics  .  Smoking status: Former Smoker    Packs/day: 1.00    Years: 40.00    Types: Cigarettes    Quit date: 02/12/2011  . Smokeless tobacco: Never Used  . Alcohol use Yes     Comment: sometimes daily, some occasional  . Drug use: No  . Sexual activity: Not Currently   Other Topics Concern  . 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  MEDICATIONS:  Current Outpatient Prescriptions  Medication Sig Dispense Refill  . ALPRAZolam (XANAX) 0.5 MG tablet Take 1 tablet (0.5 mg total) by mouth 2 (two) times daily as needed for anxiety or sleep. 60 tablet 2  . amLODipine (NORVASC) 10 MG tablet Take 0.5 tablets (5 mg total) by mouth daily. 90 tablet 3  . anastrozole (ARIMIDEX) 1 MG tablet Take 1 tablet (1 mg total) by mouth daily. 90 tablet 2  . Cholecalciferol (VITAMIN D3) 1000 UNITS CAPS Take 1 capsule by mouth daily.     Marland Kitchen losartan-hydrochlorothiazide (HYZAAR) 100-25 MG tablet Take 1 tablet by mouth daily. 90 tablet 3  . meclizine (ANTIVERT) 12.5 MG tablet Take 1 tablet (12.5 mg total) by mouth 2 (two) times daily as needed for dizziness. 10 tablet 0  . palbociclib (IBRANCE) 125 MG capsule Take 1 capsule (125 mg total) by mouth daily with breakfast. Take whole with food. 21 capsule 2  . pantoprazole (PROTONIX) 40 MG tablet Take 1 tablet (40 mg total) by mouth daily. 90 tablet 3  . rivaroxaban (XARELTO) 20 MG TABS tablet Take 1 tablet (20 mg total) by mouth daily. 30 tablet 11  . spironolactone (ALDACTONE) 50 MG tablet Take 1 tablet (50 mg total) by mouth daily. 90 tablet 3  . triamcinolone ointment (KENALOG) 0.5 % Apply 1 application topically 3 (three) times daily. 30 g 2  . diphenoxylate-atropine (LOMOTIL) 2.5-0.025 MG tablet Take 1 tablet by mouth 4 (four) times daily as needed for diarrhea or loose stools. (Patient not taking: Reported on 04/19/2017) 60 tablet 1  . polyethylene glycol  (MIRALAX / GLYCOLAX) packet Take 17 g by mouth 2 (two) times daily. (Patient not taking: Reported on 04/19/2017) 14 each 0  . tiZANidine (ZANAFLEX) 4 MG tablet Take 4 mg by mouth every 6 (six) hours as needed for muscle spasms.     No current facility-administered medications for this encounter.    Facility-Administered Medications Ordered in Other Encounters  Medication Dose Route Frequency Provider Last Rate Last Dose  . alteplase (CATHFLO ACTIVASE) injection 2 mg  2 mg Intracatheter Once PRN Curt Bears, MD      . denosumab (XGEVA) injection 120 mg  120 mg Subcutaneous Once Curt Bears, MD        REVIEW OF SYSTEMS:  On review of systems, the patient reports that she is doing well overall. She reports shortness of breath which has come  on gradually in the couple months. She attributed this to weight. She reports left ankle swelling this past Saturday, 6/30. She reports occasional left calf pain and massages the calf to relieve this pain. She does report waking up gasping for air once or twice last week. She reports fatigue. She reports occasional loss of appetite. She denies any chest pain, cough, fevers, chills, night sweats, unintended weight changes. She denies any bowel disturbances, and denies abdominal pain. She reports acid reflux. She reports occasional urinary leakage due to restricted mobility. She reports intermittent numbness and tingling in right hand. She notices this with use of the computer and playing cards. She reports pain in the middle lower portion of her back with activity. Reports taking OTC back and body. Zanaflex is on one of the patient's many medication lists. Patient reports she isn't taking medication because it makes her "blah for two days". She denies any new weakness in the lower extremities. She did not take 1 of the 2 fluid pills she is prescribed for the last two days. She typically does not take "the long fluid pill" when she has plans to leave the house for  the day. A complete review of systems is obtained and is otherwise negative.    PHYSICAL EXAM:  Wt Readings from Last 3 Encounters:  05/02/17 229 lb (103.9 kg)  04/19/17 227 lb 3.2 oz (103.1 kg)  04/14/17 227 lb 14.4 oz (103.4 kg)   Temp Readings from Last 3 Encounters:  04/19/17 98.7 F (37.1 C) (Oral)  04/14/17 98.1 F (36.7 C) (Oral)  04/04/17 97.7 F (36.5 C) (Oral)   BP Readings from Last 3 Encounters:  05/02/17 (!) 146/70  04/19/17 (!) 152/59  04/14/17 (!) 167/79   Pulse Readings from Last 3 Encounters:  05/02/17 88  04/19/17 88  04/14/17 82    In general this is a well appearing African American female in no acute distress. She is alert and oriented x4 and appropriate throughout the examination. HEENT reveals that the patient is normocephalic, atraumatic. EOMs are intact. PERRLA. Skin is intact without any evidence of gross lesions. Cardiovascular exam reveals a regular rate and rhythm, no clicks rubs or murmurs are auscultated. Chest is clear to auscultation bilaterally. Lymphatic assessment is performed and does not reveal any adenopathy in the cervical, supraclavicular, axillary, or inguinal chains. Abdomen has active bowel sounds in all quadrants and is intact. The abdomen is soft, non tender, non distended. Lower extremities reveal equalilateral 1+ pitting edema from the dorsal feet to the tibial tuberosity. No deep calf tenderness is palpable. 5/5 motor strength in bilateral upper extremities as well as intact sensory perception of the trunk.  KPS = 90  100 - Normal; no complaints; no evidence of disease. 90   - Able to carry on normal activity; minor signs or symptoms of disease. 80   - Normal activity with effort; some signs or symptoms of disease. 93   - Cares for self; unable to carry on normal activity or to do active work. 60   - Requires occasional assistance, but is able to care for most of his personal needs. 50   - Requires considerable assistance and  frequent medical care. 84   - Disabled; requires special care and assistance. 23   - Severely disabled; hospital admission is indicated although death not imminent. 52   - Very sick; hospital admission necessary; active supportive treatment necessary. 10   - Moribund; fatal processes progressing rapidly. 0     -  Dead  Karnofsky DA, Abelmann Dunlo, Craver LS and [place] [phone]) The use of the nitrogen mustards in the palliative treatment of carcinoma: with particular reference to bronchogenic carcinoma Cancer 1 [id number]  LABORATORY DATA:  Lab Results  Component Value Date   WBC 6.1 04/12/2017   HGB 13.0 04/12/2017   HCT 39.9 04/12/2017   MCV 85.7 04/12/2017   PLT 264 04/12/2017   Lab Results  Component Value Date   NA 140 04/12/2017   K 4.1 04/12/2017   CL 105 03/02/2016   CO2 23 04/12/2017   Lab Results  Component Value Date   ALT 13 04/12/2017   AST 20 04/12/2017   ALKPHOS 108 04/12/2017   BILITOT 0.82 04/12/2017     RADIOGRAPHY: Ct Chest W Contrast  Result Date: 04/15/2017 CLINICAL DATA:  Metastatic breast cancer with bone metastases, status post right lumpectomy and right axillary lymph node dissection, status post radiation therapy EXAM: CT CHEST, ABDOMEN, AND PELVIS WITH CONTRAST TECHNIQUE: Multidetector CT imaging of the chest, abdomen and pelvis was performed following the standard protocol during bolus administration of intravenous contrast. CONTRAST:  158m ISOVUE-300 IOPAMIDOL (ISOVUE-300) INJECTION 61% COMPARISON:  Whole-body bone scan and CT chest abdomen pelvis dated 12/29/2015 FINDINGS: CT CHEST FINDINGS Cardiovascular: The heart is normal in size. No pericardial effusion. Coronary atherosclerosis the LAD. Mild ectasia of the ascending thoracic aorta, measuring 3.4 cm. Atherosclerotic calcifications aortic arch. Left chest port terminates at the cavoatrial junction. Mediastinum/Nodes: Small mediastinal lymph nodes, including an 8 mm short axis right paratracheal node  and a 9 mm short axis subcarinal node. 13 mm short axis right hilar node (series 2/ image 24), mildly enlarged. Status post right axillary lymph node dissection. Visualized left thyroid is mildly nodular. Lungs/Pleura: Numerous bilateral pulmonary nodules/metastases, new, many of which are subpleural. For example: --11 mm irregular nodule in the anterior left upper lobe (series 6/ image 40) --12 mm subpleural nodule in the anterior right lower lobe along the major fissure (series 6/image 71) --13 mm subpleural nodule in the anterior left lower lobe along the fissure (series 6/image 72) --12 mm subpleural nodule in the posterior left lower lobe (series 6/ image 100) Mild biapical pleural-parenchymal scarring. No focal consolidation. No pleural effusion or pneumothorax. Musculoskeletal: Postsurgical changes related to right breast lumpectomy in the inner right breast (series 2/ image 16). Overlying skin thickening, reflecting radiation changes. Sclerotic lesion involving the T10 vertebral body (sagittal image 79), new, suspicious for metastasis. Stable sclerotic lesion at T9 (series 2/image 31). Degenerative changes of the thoracic spine. CT ABDOMEN PELVIS FINDINGS Hepatobiliary: Liver is within normal limits. Cholelithiasis, without associated inflammatory changes. Pancreas: Within normal limits. Spleen: Within normal limits. Adrenals/Urinary Tract: Adrenal glands are within normal limits. Kidneys are within normal limits.  No hydronephrosis. Bladder is within normal limits, although partially obscured by streak artifact. Stomach/Bowel: Stomach is notable for a small hiatal hernia. No evidence of bowel obstruction. Normal appendix (series 2/ image 84). Sigmoid diverticulosis, with mild mucosal hypertrophy (series 2/ image 95), but without associated inflammatory changes. Vascular/Lymphatic: No evidence of abdominal aortic aneurysm. Atherosclerotic calcifications of the abdominal aorta and branch vessels. No  suspicious abdominopelvic lymphadenopathy. Reproductive: Suspected prior hysterectomy, although poorly evaluated due to streak artifact. No adnexal masses. Other: No abdominopelvic ascites. Musculoskeletal: Stable sclerotic lesion involving the L3 vertebral body (sagittal image 73). Degenerative changes of the lumbar spine. Bilateral hip arthroplasties, without evidence of complication. IMPRESSION: Status post right breast lumpectomy with right axillary lymph node dissection. Numerous bilateral pulmonary  nodules measuring up to 13 mm, new, compatible with widespread pulmonary metastases. 13 mm short axis right hilar node, metastasis not excluded. New sclerotic lesion involving the T10 vertebral body, suspicious for metastasis. Additional stable sclerotic lesions at T9 and L3. Correlate with pending bone scan. Additional ancillary findings as above. Electronically Signed   By: Julian Hy M.D.   On: 04/15/2017 15:14   Nm Bone Scan Whole Body  Result Date: 04/15/2017 CLINICAL DATA:  76 year old female with breast cancer. New widespread pulmonary metastases, and new sclerotic T10 vertebral lesion on restaging chest abdomen and pelvis CT today. EXAM: NUCLEAR MEDICINE WHOLE BODY BONE SCAN TECHNIQUE: Whole body anterior and posterior images were obtained approximately 3 hours after intravenous injection of radiopharmaceutical. RADIOPHARMACEUTICALS:  19.0 mCi Technetium-57mMDP IV COMPARISON:  CT chest abdomen and pelvis today reported separately. Whole-body bone scan 12/29/2015. FINDINGS: Large body habitus. Expected radiotracer activity in both kidneys in the urinary bladder. A new round focus of moderate to intense radiotracer activity in the lower thoracic spine appears to correspond to the T10 level sclerotic lesion seen by CT today. Other spinal radiotracer activity is stable is at the at and has been felt related to benign degenerative etiology. This includes a chronic prominent left paraspinal focus of  activity at the lower lumbar spine where a bulky left lumbosacral junction facet arthropathy is noted by CT. Chronic radiotracer activity at both sternoclavicular joints also appears stable and degenerative in nature. No other suspicious radiotracer activity identified in the axial skeleton. Stable degenerative appearing radiotracer activity in the visible appendicular skeleton. IMPRESSION: 1. Positive scintigraphic correlation to the sclerotic T10 vertebral body lesion which is most compatible with an osseous metastasis. 2. Otherwise stable since 2017, with no other scintigraphic findings specific for metastatic disease to bone. Electronically Signed   By: HGenevie AnnM.D.   On: 04/15/2017 16:03   Ct Abdomen Pelvis W Contrast  Result Date: 04/15/2017 CLINICAL DATA:  Metastatic breast cancer with bone metastases, status post right lumpectomy and right axillary lymph node dissection, status post radiation therapy EXAM: CT CHEST, ABDOMEN, AND PELVIS WITH CONTRAST TECHNIQUE: Multidetector CT imaging of the chest, abdomen and pelvis was performed following the standard protocol during bolus administration of intravenous contrast. CONTRAST:  1032mISOVUE-300 IOPAMIDOL (ISOVUE-300) INJECTION 61% COMPARISON:  Whole-body bone scan and CT chest abdomen pelvis dated 12/29/2015 FINDINGS: CT CHEST FINDINGS Cardiovascular: The heart is normal in size. No pericardial effusion. Coronary atherosclerosis the LAD. Mild ectasia of the ascending thoracic aorta, measuring 3.4 cm. Atherosclerotic calcifications aortic arch. Left chest port terminates at the cavoatrial junction. Mediastinum/Nodes: Small mediastinal lymph nodes, including an 8 mm short axis right paratracheal node and a 9 mm short axis subcarinal node. 13 mm short axis right hilar node (series 2/ image 24), mildly enlarged. Status post right axillary lymph node dissection. Visualized left thyroid is mildly nodular. Lungs/Pleura: Numerous bilateral pulmonary  nodules/metastases, new, many of which are subpleural. For example: --11 mm irregular nodule in the anterior left upper lobe (series 6/ image 40) --12 mm subpleural nodule in the anterior right lower lobe along the major fissure (series 6/image 71) --13 mm subpleural nodule in the anterior left lower lobe along the fissure (series 6/image 72) --12 mm subpleural nodule in the posterior left lower lobe (series 6/ image 100) Mild biapical pleural-parenchymal scarring. No focal consolidation. No pleural effusion or pneumothorax. Musculoskeletal: Postsurgical changes related to right breast lumpectomy in the inner right breast (series 2/ image 16). Overlying skin thickening, reflecting  radiation changes. Sclerotic lesion involving the T10 vertebral body (sagittal image 79), new, suspicious for metastasis. Stable sclerotic lesion at T9 (series 2/image 31). Degenerative changes of the thoracic spine. CT ABDOMEN PELVIS FINDINGS Hepatobiliary: Liver is within normal limits. Cholelithiasis, without associated inflammatory changes. Pancreas: Within normal limits. Spleen: Within normal limits. Adrenals/Urinary Tract: Adrenal glands are within normal limits. Kidneys are within normal limits.  No hydronephrosis. Bladder is within normal limits, although partially obscured by streak artifact. Stomach/Bowel: Stomach is notable for a small hiatal hernia. No evidence of bowel obstruction. Normal appendix (series 2/ image 84). Sigmoid diverticulosis, with mild mucosal hypertrophy (series 2/ image 95), but without associated inflammatory changes. Vascular/Lymphatic: No evidence of abdominal aortic aneurysm. Atherosclerotic calcifications of the abdominal aorta and branch vessels. No suspicious abdominopelvic lymphadenopathy. Reproductive: Suspected prior hysterectomy, although poorly evaluated due to streak artifact. No adnexal masses. Other: No abdominopelvic ascites. Musculoskeletal: Stable sclerotic lesion involving the L3 vertebral  body (sagittal image 73). Degenerative changes of the lumbar spine. Bilateral hip arthroplasties, without evidence of complication. IMPRESSION: Status post right breast lumpectomy with right axillary lymph node dissection. Numerous bilateral pulmonary nodules measuring up to 13 mm, new, compatible with widespread pulmonary metastases. 13 mm short axis right hilar node, metastasis not excluded. New sclerotic lesion involving the T10 vertebral body, suspicious for metastasis. Additional stable sclerotic lesions at T9 and L3. Correlate with pending bone scan. Additional ancillary findings as above. Electronically Signed   By: Julian Hy M.D.   On: 04/15/2017 15:14      IMPRESSION/PLAN: 1. 76 y.o. with Recurrent metastatic ER/PR positive breast cancer with metastatic disease to the lungs and T10 vertebral body. We evaluated the patient, and in discussing her symptoms, her back pain is localized to the lower middle portion of the back and is only painful with activity. This looks to correlate with degenerative arthropathy rather than metastasis. We do not believe the patient would benefit significantly from radiation treatment, and would not recommend treatment at this time. She may require radiation to the T10 level for pain management if her symptoms do not improve with her systemic therapy. We discussed this today. We will follow up with her as needed.  2. Left lower extremity edema and SOB. Patient reports gradually increasing shortness of breath over the last couple months with and without exertion. Patient reports left ankle swelling this past Saturday, 6/30, and left calf pain. However was prescribed Xarelto 04/13/2016. Although sheis not sure why she was prescribed this medication and is not taking it, we recommended resuming this and have a call into her PCP to notify him. She also is note taking her diuretics for the last few days, and was encouraged to take this. If her symptoms do not improve with  these measures, or they acutely progress, she understands to be seen in the ED for further work up to rule out DVT/PE.       Carola Rhine, PAC  Seen with ------------------------------------------------   Tyler Pita, MD [facility] Medical Director and Director of Stereotactic Radiosurgery Direct Dial: [phone]  Fax: [phone] [email or web address]  Skype  [email or web address]  This document serves as a record of services personally performed by Tyler Pita, MD and Shona Simpson, PA-C. It was created on their behalf by Arlyce Harman, a trained medical scribe. The creation of this record is based on the scribe's personal observations and the provider's statements to them. This document has been checked and approved by the attending provider.

## 2017-05-02 NOTE — Progress Notes (Signed)
See progress note under physician encounter. 

## 2017-05-02 NOTE — Telephone Encounter (Signed)
Oral Oncology Patient Advocate Encounter  Met patient in exam room to complete application for [employer] Patient Assistance Program in an effort to reduce patient's out of pocket expense for Ibrance to $0.    Application completed and faxed to [phone].   [employer] patient assistance phone number for follow up is [phone].   This encounter will be updated until final determination.   [name] [name]. [name], [facility] [phone] 05/02/2017 4:19 PM

## 2017-05-02 NOTE — Telephone Encounter (Signed)
Ebony Hail from Radiation Oncology at [facility] called wanting to speak with Dr Alain Marion to see why the pt was given a blood thinner. She said that the pt has not been taking it but was with her today and was experiencing some shortness of breath. She wanted to see if there was anything that they could do for her there or if she should be sent to the ED. After speaking with Inez Catalina and Jonelle Sidle it was suggested that due to her history it would be best for the pt to go to the ED to be checked out. Ebony Hail asked if Dr Alain Marion would give her a call on her cell at his convinence. Her phone number is [phone].

## 2017-05-03 ENCOUNTER — Telehealth: Payer: Self-pay | Admitting: Pharmacy Technician

## 2017-05-03 NOTE — Telephone Encounter (Signed)
Xarelto was started because of the elevated D dimer, clinical suspicion in the view of the metastatic cancer.  Agree w/plan Thx

## 2017-05-03 NOTE — Telephone Encounter (Signed)
Spoke with [place] and she just wanted to make sure we were aware of the conversation below. She did inform patient to start Xarelto again.

## 2017-05-03 NOTE — Telephone Encounter (Signed)
Oral Oncology Patient Advocate Encounter  Was successful in securing patient a $5400.00 grant from [facility]) to provice copayment coverage for her [facility].  This will keep the out of pocket expense at $0.    I have spoken with the patient.    The billing information is as follows and has been shared with [place].   Member ID: [id number] Group ID: [id number] RxBin: [id number] Dates of Eligibility: 02/02/2017 through 05/02/2018  Fabio Asa. [name], [place] Oral Oncology Patient Advocate [phone] 05/03/2017 4:16 PM

## 2017-05-05 ENCOUNTER — Other Ambulatory Visit (HOSPITAL_BASED_OUTPATIENT_CLINIC_OR_DEPARTMENT_OTHER): Payer: Medicare Other

## 2017-05-05 DIAGNOSIS — C7951 Secondary malignant neoplasm of bone: Secondary | ICD-10-CM | POA: Diagnosis not present

## 2017-05-05 DIAGNOSIS — C50911 Malignant neoplasm of unspecified site of right female breast: Secondary | ICD-10-CM

## 2017-05-05 LAB — CBC WITH DIFFERENTIAL/PLATELET
BASO%: 0.3 % (ref 0.0–2.0)
Basophils Absolute: 0 10*3/uL (ref 0.0–0.1)
EOS%: 2.8 % (ref 0.0–7.0)
Eosinophils Absolute: 0.1 10*3/uL (ref 0.0–0.5)
HCT: 35.1 % (ref 34.8–46.6)
HEMOGLOBIN: 11.3 g/dL — AB (ref 11.6–15.9)
LYMPH#: 0.7 10*3/uL — AB (ref 0.9–3.3)
LYMPH%: 25.2 % (ref 14.0–49.7)
MCH: 28 pg (ref 25.1–34.0)
MCHC: 32.2 g/dL (ref 31.5–36.0)
MCV: 87.1 fL (ref 79.5–101.0)
MONO#: 0.1 10*3/uL (ref 0.1–0.9)
MONO%: 2.1 % (ref 0.0–14.0)
NEUT#: 2 10*3/uL (ref 1.5–6.5)
NEUT%: 69.6 % (ref 38.4–76.8)
PLATELETS: 203 10*3/uL (ref 145–400)
RBC: 4.03 10*6/uL (ref 3.70–5.45)
RDW: 16.1 % — AB (ref 11.2–14.5)
WBC: 2.9 10*3/uL — ABNORMAL LOW (ref 3.9–10.3)

## 2017-05-05 LAB — COMPREHENSIVE METABOLIC PANEL
ALBUMIN: 3.3 g/dL — AB (ref 3.5–5.0)
ALK PHOS: 90 U/L (ref 40–150)
ALT: 30 U/L (ref 0–55)
ANION GAP: 11 meq/L (ref 3–11)
AST: 44 U/L — AB (ref 5–34)
BILIRUBIN TOTAL: 0.85 mg/dL (ref 0.20–1.20)
BUN: 22.9 mg/dL (ref 7.0–26.0)
CALCIUM: 9.9 mg/dL (ref 8.4–10.4)
CHLORIDE: 106 meq/L (ref 98–109)
CO2: 21 mEq/L — ABNORMAL LOW (ref 22–29)
CREATININE: 1.7 mg/dL — AB (ref 0.6–1.1)
EGFR: 35 mL/min/{1.73_m2} — ABNORMAL LOW (ref >90)
Glucose: 100 mg/dl (ref 70–140)
Potassium: 4.4 mEq/L (ref 3.5–5.1)
Sodium: 138 mEq/L (ref 136–145)
Total Protein: 7.7 g/dL (ref 6.4–8.3)

## 2017-05-18 MED FILL — IBRANCE 125 MG CAPSULE: 125 | 28 days supply | Qty: 21 | Fill #1

## 2017-05-18 NOTE — Telephone Encounter (Signed)
Since the patient was able to obtain grand funding for copay assistance, the patient's application was placed on hold with [place].    Fabio Asa. [name], [place] Patient [place] [phone] 05/18/2017 11:18 AM

## 2017-05-19 ENCOUNTER — Telehealth: Payer: Self-pay | Admitting: Internal Medicine

## 2017-05-19 ENCOUNTER — Ambulatory Visit (HOSPITAL_BASED_OUTPATIENT_CLINIC_OR_DEPARTMENT_OTHER): Payer: Medicare Other | Admitting: Nurse Practitioner

## 2017-05-19 ENCOUNTER — Other Ambulatory Visit (HOSPITAL_BASED_OUTPATIENT_CLINIC_OR_DEPARTMENT_OTHER): Payer: Medicare Other

## 2017-05-19 VITALS — BP 157/58 | HR 76 | Temp 97.8°F | Resp 20 | Ht 63.0 in | Wt 226.9 lb

## 2017-05-19 DIAGNOSIS — D702 Other drug-induced agranulocytosis: Secondary | ICD-10-CM

## 2017-05-19 DIAGNOSIS — C7951 Secondary malignant neoplasm of bone: Secondary | ICD-10-CM

## 2017-05-19 DIAGNOSIS — C50919 Malignant neoplasm of unspecified site of unspecified female breast: Secondary | ICD-10-CM

## 2017-05-19 DIAGNOSIS — D6959 Other secondary thrombocytopenia: Secondary | ICD-10-CM

## 2017-05-19 DIAGNOSIS — Z170 Estrogen receptor positive status [ER+]: Secondary | ICD-10-CM

## 2017-05-19 DIAGNOSIS — C50911 Malignant neoplasm of unspecified site of right female breast: Secondary | ICD-10-CM

## 2017-05-19 LAB — COMPREHENSIVE METABOLIC PANEL
ALT: 21 U/L (ref 0–55)
AST: 27 U/L (ref 5–34)
Albumin: 3.6 g/dL (ref 3.5–5.0)
Alkaline Phosphatase: 85 U/L (ref 40–150)
Anion Gap: 11 mEq/L (ref 3–11)
BUN: 21.5 mg/dL (ref 7.0–26.0)
CO2: 23 mEq/L (ref 22–29)
CREATININE: 1.7 mg/dL — AB (ref 0.6–1.1)
Calcium: 9.5 mg/dL (ref 8.4–10.4)
Chloride: 105 mEq/L (ref 98–109)
EGFR: 33 mL/min/{1.73_m2} — ABNORMAL LOW (ref >90)
Glucose: 103 mg/dl (ref 70–140)
POTASSIUM: 3.7 meq/L (ref 3.5–5.1)
SODIUM: 139 meq/L (ref 136–145)
Total Bilirubin: 0.87 mg/dL (ref 0.20–1.20)
Total Protein: 8.1 g/dL (ref 6.4–8.3)

## 2017-05-19 LAB — CBC WITH DIFFERENTIAL/PLATELET
BASO%: 1.2 % (ref 0.0–2.0)
Basophils Absolute: 0 10*3/uL (ref 0.0–0.1)
EOS%: 0.6 % (ref 0.0–7.0)
Eosinophils Absolute: 0 10*3/uL (ref 0.0–0.5)
HEMATOCRIT: 35 % (ref 34.8–46.6)
HEMOGLOBIN: 11.1 g/dL — AB (ref 11.6–15.9)
LYMPH#: 0.5 10*3/uL — AB (ref 0.9–3.3)
LYMPH%: 31.4 % (ref 14.0–49.7)
MCH: 28.4 pg (ref 25.1–34.0)
MCHC: 31.7 g/dL (ref 31.5–36.0)
MCV: 89.5 fL (ref 79.5–101.0)
MONO#: 0.1 10*3/uL (ref 0.1–0.9)
MONO%: 3.5 % (ref 0.0–14.0)
NEUT%: 63.3 % (ref 38.4–76.8)
NEUTROS ABS: 1.1 10*3/uL — AB (ref 1.5–6.5)
Platelets: 96 10*3/uL — ABNORMAL LOW (ref 145–400)
RBC: 3.91 10*6/uL (ref 3.70–5.45)
RDW: 17.5 % — ABNORMAL HIGH (ref 11.2–14.5)
WBC: 1.7 10*3/uL — AB (ref 3.9–10.3)

## 2017-05-19 LAB — CEA (IN HOUSE-CHCC): CEA (CHCC-In House): 3.65 ng/mL (ref 0.00–5.00)

## 2017-05-19 NOTE — Telephone Encounter (Signed)
Gave patient avs report and appointments for July and August. Added lab 7/25 and lab/MM 4 weeks (8/20). Per LT keep existing lab appointments.

## 2017-05-19 NOTE — Progress Notes (Signed)
[place] OFFICE PROGRESS NOTE   DIAGNOSIS: Metastatic breast adenocarcinoma presented with bone metastasis in the left hip area this was initially diagnosed as a stage II (T2, N1, M0) invasive right breast carcinoma with positive estrogen and progesterone receptor as well as HER-2/neu in July 2002  PRIOR THERAPY: 1. Status post right lumpectomy with right axillary lymph node dissection revealing 4/9 lymph nodes that were positive for malignancy.  2. Status post 8 cycles of systemic chemotherapy with CMF, completed March 2003.  3. Status post radiotherapy to the remaining right breast under the care of Dr. Tammi Klippel.  4. Status post 2 years of treatment with tamoxifen, started July 2003, then switched to Femara by Dr. Humphrey Rolls in August 2005. The patient discontinued the treatment in February 2009.  5. Status post left total hip replacement on Apr 01, 2011, secondary to pathologic left femoral neck fracture. 6. Zometa 4 mg IV on 3 monthly basis. Discontinued secondary to renal insufficiency.  CURRENT THERAPY: 1. Arimidex 1 mg by mouth daily started 03/18/2011 2. Xgeva 120 g subcutaneously every 3 months.  3. Ibrance 125 mg daily 21 days on/7 days off started 04/26/2017  INTERVAL HISTORY:   [name] [name] returns as scheduled. She began cycle 1 Ibrance 04/26/2017. She misunderstood the directions and did not take a 7 day break. She has had a few episodes of nausea/vomiting. Gums feel sore, no ulcers. No diarrhea. Continued back pain. No fever or cough. Stable mild dyspnea on exertion. She feels "cold" at times. No shaking chills.  Objective:  Vital signs in last 24 hours:  Blood pressure (!) 157/58, pulse 76, temperature 97.8 F (36.6 C), temperature source Oral, resp. rate 20, height 5' 3" (1.6 m), weight 226 lb 14.4 oz (102.9 kg), SpO2 100 %.    HEENT: No thrush or ulcers. Resp: Lungs clear bilaterally. Cardio: Regular rate and rhythm. GI: Abdomen soft and nontender. No  hepatomegaly. Vascular: No leg edema. Neuro: Alert and oriented.  Skin: No rash.    Lab Results:  Lab Results  Component Value Date   WBC 1.7 (L) 05/19/2017   HGB 11.1 (L) 05/19/2017   HCT 35.0 05/19/2017   MCV 89.5 05/19/2017   PLT 96 (L) 05/19/2017   NEUTROABS 1.1 (L) 05/19/2017    Imaging:  No results found.  Medications: I have reviewed the patient's current medications.  Assessment/Plan: 1. Metastatic breast carcinoma status post treatment with Arimidex 1 mg by mouth daily since May 2012; restaging evaluation 04/15/2017 showed numerous new bilateral lung nodules and a new lesion involving T10. She is on Arimidex. Ibrance initiated 04/26/2017. Xgeva every 3 months, last given 04/14/2017. 2. Neutropenia and thrombocytopenia secondary to Ibrance.   Disposition: [name] [name] appears stable. She completed cycle 1 Ibrance beginning 04/26/2017. She misunderstood the instructions and did not take a 7 day break. She will discontinue Ibrance at this time and begin the 7 day break.  She has neutropenia and thrombocytopenia on labs today. We reviewed precautions. She understands to contact the office with fever, chills, other signs of infection, bleeding. She will return for a follow-up CBC 05/25/2017. She understands she should not resume Ibrance until she hears from our office regarding the labs.  She will return for a follow-up visit in 4 weeks. She will contact the office in the interim as outlined above or with any other problems.  Plan reviewed with Dr. Julien Nordmann.    Ned Card ANP/GNP-BC   05/19/2017  9:27 AM

## 2017-05-25 ENCOUNTER — Other Ambulatory Visit (HOSPITAL_BASED_OUTPATIENT_CLINIC_OR_DEPARTMENT_OTHER): Payer: Medicare Other

## 2017-05-25 ENCOUNTER — Telehealth: Payer: Self-pay | Admitting: Medical Oncology

## 2017-05-25 ENCOUNTER — Telehealth: Payer: Self-pay | Admitting: *Deleted

## 2017-05-25 DIAGNOSIS — C7951 Secondary malignant neoplasm of bone: Secondary | ICD-10-CM

## 2017-05-25 DIAGNOSIS — C50919 Malignant neoplasm of unspecified site of unspecified female breast: Secondary | ICD-10-CM

## 2017-05-25 DIAGNOSIS — C50911 Malignant neoplasm of unspecified site of right female breast: Secondary | ICD-10-CM

## 2017-05-25 LAB — CBC WITH DIFFERENTIAL/PLATELET
BASO%: 1.8 % (ref 0.0–2.0)
BASOS ABS: 0 10*3/uL (ref 0.0–0.1)
EOS%: 1.4 % (ref 0.0–7.0)
Eosinophils Absolute: 0 10*3/uL (ref 0.0–0.5)
HCT: 35 % (ref 34.8–46.6)
HGB: 11.1 g/dL — ABNORMAL LOW (ref 11.6–15.9)
LYMPH#: 0.9 10*3/uL (ref 0.9–3.3)
LYMPH%: 40.5 % (ref 14.0–49.7)
MCH: 28.7 pg (ref 25.1–34.0)
MCHC: 31.7 g/dL (ref 31.5–36.0)
MCV: 90.4 fL (ref 79.5–101.0)
MONO#: 0.4 10*3/uL (ref 0.1–0.9)
MONO%: 16.2 % — ABNORMAL HIGH (ref 0.0–14.0)
NEUT#: 0.9 10*3/uL — ABNORMAL LOW (ref 1.5–6.5)
NEUT%: 40.1 % (ref 38.4–76.8)
PLATELETS: 161 10*3/uL (ref 145–400)
RBC: 3.87 10*6/uL (ref 3.70–5.45)
RDW: 19.5 % — ABNORMAL HIGH (ref 11.2–14.5)
WBC: 2.2 10*3/uL — ABNORMAL LOW (ref 3.9–10.3)

## 2017-05-25 NOTE — Telephone Encounter (Signed)
-----   Message from Owens Shark, NP sent at 05/25/2017  9:12 AM EDT ----- Please let her know the white count is still low. Continue to hold Ibrance. Call with fever, signs of infection. Repeat CBC in one week.

## 2017-05-25 NOTE — Telephone Encounter (Signed)
Lattie Haw discussed it with me. Hold until repeat lab next week

## 2017-05-25 NOTE — Telephone Encounter (Signed)
When does she restart Ibrance? Labs today with neutrapenia. Note to [place].

## 2017-05-25 NOTE — Telephone Encounter (Signed)
Telephone call to patient and advised lab results and instructions as directed below. Patient verbalized an understanding and will call this office with any signs of infection or fevers. Patient confirms appt next week.

## 2017-05-25 NOTE — Telephone Encounter (Signed)
I tried to call pt with this information but she does not have a VM set up.

## 2017-05-27 NOTE — Addendum Note (Signed)
Encounter addended by: Tyler Pita, MD on: 05/27/2017  6:18 PM<BR>    Actions taken: Problem List reviewed

## 2017-06-02 ENCOUNTER — Other Ambulatory Visit (HOSPITAL_BASED_OUTPATIENT_CLINIC_OR_DEPARTMENT_OTHER): Payer: Medicare Other

## 2017-06-02 DIAGNOSIS — C50911 Malignant neoplasm of unspecified site of right female breast: Secondary | ICD-10-CM | POA: Diagnosis not present

## 2017-06-02 DIAGNOSIS — C7951 Secondary malignant neoplasm of bone: Secondary | ICD-10-CM | POA: Diagnosis not present

## 2017-06-02 LAB — COMPREHENSIVE METABOLIC PANEL
ALT: 19 U/L (ref 0–55)
ANION GAP: 10 meq/L (ref 3–11)
AST: 30 U/L (ref 5–34)
Albumin: 3.4 g/dL — ABNORMAL LOW (ref 3.5–5.0)
Alkaline Phosphatase: 77 U/L (ref 40–150)
BUN: 19.9 mg/dL (ref 7.0–26.0)
CALCIUM: 9.8 mg/dL (ref 8.4–10.4)
CHLORIDE: 105 meq/L (ref 98–109)
CO2: 25 meq/L (ref 22–29)
Creatinine: 1.6 mg/dL — ABNORMAL HIGH (ref 0.6–1.1)
EGFR: 38 mL/min/{1.73_m2} — AB (ref >90)
Glucose: 100 mg/dl (ref 70–140)
POTASSIUM: 3.8 meq/L (ref 3.5–5.1)
SODIUM: 140 meq/L (ref 136–145)
Total Bilirubin: 0.43 mg/dL (ref 0.20–1.20)
Total Protein: 7.5 g/dL (ref 6.4–8.3)

## 2017-06-02 LAB — CBC WITH DIFFERENTIAL/PLATELET
BASO%: 1.8 % (ref 0.0–2.0)
Basophils Absolute: 0.1 10*3/uL (ref 0.0–0.1)
EOS%: 1.5 % (ref 0.0–7.0)
Eosinophils Absolute: 0.1 10*3/uL (ref 0.0–0.5)
HEMATOCRIT: 35.2 % (ref 34.8–46.6)
HGB: 11.4 g/dL — ABNORMAL LOW (ref 11.6–15.9)
LYMPH#: 1.1 10*3/uL (ref 0.9–3.3)
LYMPH%: 32.3 % (ref 14.0–49.7)
MCH: 29.4 pg (ref 25.1–34.0)
MCHC: 32.4 g/dL (ref 31.5–36.0)
MCV: 90.7 fL (ref 79.5–101.0)
MONO#: 0.7 10*3/uL (ref 0.1–0.9)
MONO%: 20.5 % — AB (ref 0.0–14.0)
NEUT#: 1.5 10*3/uL (ref 1.5–6.5)
NEUT%: 43.9 % (ref 38.4–76.8)
Platelets: 207 10*3/uL (ref 145–400)
RBC: 3.88 10*6/uL (ref 3.70–5.45)
RDW: 20.2 % — AB (ref 11.2–14.5)
WBC: 3.4 10*3/uL — ABNORMAL LOW (ref 3.9–10.3)

## 2017-06-09 ENCOUNTER — Telehealth: Payer: Self-pay | Admitting: Medical Oncology

## 2017-06-09 NOTE — Telephone Encounter (Signed)
Per Julien Nordmann I told pt she can restart Ibrance.

## 2017-06-10 NOTE — Progress Notes (Addendum)
Subjective:   [name] [name] is a 76 y.o. female who presents for Medicare Annual (Subsequent) preventive examination.  Review of Systems:  No ROS.  Medicare Wellness Visit. Additional risk factors are reflected in the social history.  Cardiac Risk Factors include: advanced age (>77mn, >>22women);diabetes mellitus;hypertension Sleep patterns: feels rested on waking, does not get up to void, gets up 2-3 times nightly to void and sleeps 6-7 hours nightly.    Home Safety/Smoke Alarms: Feels safe in home. Smoke alarms in place.  Living environment; residence and Firearm Safety: apartment, no firearms. Lives alone, no needs for DME, good support system Seat Belt Safety/Bike Helmet: Wears seat belt.   Counseling:   Eye Exam- Last several years, resources for opthalmology provided, patient states she will make an appointment Dental- appointment yearly  Female:   Pap- N/A      Mammo- Last 11/03/10, BI-RADS CATEGORY 4:  Suspicious abnormality - biopsy should be considered. Breast Cancer diagnosis, patient will discuss with Dr. MJulien Nordmannduring 06/20/17 appointment   Dexa scan- N/D, patient will discuss with Dr. MJulien Nordmannduring 06/20/17 appointment         CCS- Last 05/07/03, recall 10 years, Cologuard education and pamphlet provided, patient states she wants to discuss screening with Dr. MJulien Nordmann8/20/18 appoitment     Objective:     Vitals: BP 118/62   Pulse 76   Resp 20   Ht _0  (1.6 m)   Wt 223 lb (101.2 kg)   SpO2 97%   BMI 39.50 kg/m   Body mass index is 39.5 kg/m.   Tobacco History  Smoking Status  . Former Smoker  . Packs/day: 1.00  . Years: 40.00  . Types: Cigarettes  . Quit date: 02/12/2011  Smokeless Tobacco  . Never Used     Counseling given: Not Answered   Past Medical History:  Diagnosis Date  . Anxiety    hx of  . Asthma   . [place]    [place]; metastatic in 2012, right breast, spread to left hip in 2012  . Cerebrovascular accident ([facility]    R  thalamic CVA 01/2010  . Dyslipidemia   . Encounter for therapeutic drug monitoring 04/14/2017  . Goals of care, counseling/discussion 04/19/2017  . Hyperlipemia   . Hypertension   . Low back pain   . Osteoarthritis   . TIA (transient ischemic attack)    x2 in 2011  . Vertigo 2014   Past Surgical History:  Procedure Laterality Date  . ABDOMINAL HYSTERECTOMY     complete  . BREAST LUMPECTOMY     Right breast 2002  . JOINT REPLACEMENT  2012   L hip due to breast ca met  . TOTAL HIP ARTHROPLASTY Right 10/28/2015   Procedure: RIGHT TOTAL HIP ARTHROPLASTY ANTERIOR APPROACH;  Surgeon: MParalee Cancel MD;  Location: [facility] ORS;  Service: Orthopedics;  Laterality: Right;   Family History  Problem Relation Age of Onset  . Hypertension Mother   . Hypertension Father   . Cancer Neg Hx    History  Sexual Activity  . Sexual activity: Not Currently    Outpatient Encounter Prescriptions as of 06/13/2017  Medication Sig  . ALPRAZolam (XANAX) 0.5 MG tablet Take 1 tablet (0.5 mg total) by mouth 2 (two) times daily as needed for anxiety or sleep.  .Marland KitchenamLODipine (NORVASC) 10 MG tablet Take 0.5 tablets (5 mg total) by mouth daily.  .Marland Kitchenanastrozole (ARIMIDEX) 1 MG tablet Take 1 tablet (1 mg total)  by mouth daily.  . Cholecalciferol (VITAMIN D3) 1000 UNITS CAPS Take 1 capsule by mouth daily.   Marland Kitchen losartan-hydrochlorothiazide (HYZAAR) 100-25 MG tablet Take 1 tablet by mouth daily.  . meclizine (ANTIVERT) 12.5 MG tablet Take 1 tablet (12.5 mg total) by mouth 2 (two) times daily as needed for dizziness.  . palbociclib (IBRANCE) 125 MG capsule Take 1 capsule (125 mg total) by mouth daily with breakfast. Take whole with food.  Marland Kitchen spironolactone (ALDACTONE) 50 MG tablet Take 1 tablet (50 mg total) by mouth daily.  Marland Kitchen tiZANidine (ZANAFLEX) 4 MG tablet Take 4 mg by mouth every 6 (six) hours as needed for muscle spasms.  . [DISCONTINUED] diphenoxylate-atropine (LOMOTIL) 2.5-0.025 MG tablet Take 1 tablet by mouth 4  (four) times daily as needed for diarrhea or loose stools. (Patient not taking: Reported on 06/13/2017)  . [DISCONTINUED] pantoprazole (PROTONIX) 40 MG tablet Take 1 tablet (40 mg total) by mouth daily. (Patient not taking: Reported on 06/13/2017)  . [DISCONTINUED] polyethylene glycol (MIRALAX / GLYCOLAX) packet Take 17 g by mouth 2 (two) times daily. (Patient not taking: Reported on 04/19/2017)  . [DISCONTINUED] rivaroxaban (XARELTO) 20 MG TABS tablet Take 1 tablet (20 mg total) by mouth daily. (Patient not taking: Reported on 05/19/2017)  . [DISCONTINUED] triamcinolone ointment (KENALOG) 0.5 % Apply 1 application topically 3 (three) times daily. (Patient not taking: Reported on 06/13/2017)   Facility-Administered Encounter Medications as of 06/13/2017  Medication  . alteplase (CATHFLO ACTIVASE) injection 2 mg  . denosumab (XGEVA) injection 120 mg    Activities of Daily Living In your present state of health, do you have any difficulty performing the following activities: 06/13/2017  Hearing? N  Vision? N  Difficulty concentrating or making decisions? N  Walking or climbing stairs? Y  Dressing or bathing? N  Doing errands, shopping? N  Preparing Food and eating ? N  Using the Toilet? N  In the past six months, have you accidently leaked urine? N  Do you have problems with loss of bowel control? N  Managing your Medications? N  Managing your Finances? N  Housekeeping or managing your Housekeeping? N  Some recent data might be hidden    Patient Care Team: [name], [name], MD as PCP - General (Internal Medicine) Curt Bears, MD (Hematology and Oncology) Paralee Cancel, MD (Orthopedic Surgery) Tyler Pita, MD (Radiation Oncology)    Assessment:    Physical assessment deferred to PCP.  Exercise Activities and Dietary recommendations Current Exercise Habits: The patient does not participate in regular exercise at present, Exercise limited by: None identified  Diet (meal  preparation, eat out, water intake, caffeinated beverages, dairy products, fruits and vegetables): in general, a "healthy" diet  , well balanced, reports decreased appetite at times.  Reviewed heart healthy and diabetic diet, encouraged patient to increase daily water intake and to supplement with ensure (coupons provided)   Goals    . increase my strength so I can attend football games      Fall Risk Fall Risk  06/13/2017 05/02/2017 04/13/2016 09/11/2014 08/12/2014  Falls in the past year? _0   Risk for fall due to : - - - Impaired balance/gait;Impaired mobility Impaired balance/gait  Risk for fall due to: Comment - - - has walker uses cane   Depression Screen PHQ 2/9 Scores 06/13/2017 05/02/2017 04/13/2016  PHQ - 2 Score 1 0 0  PHQ- 9 Score 3 - -     Cognitive Function MMSE - Mini Mental State Exam  06/13/2017  Orientation to time 5  Orientation to Place 5  Registration 3  Attention/ Calculation 5  Recall 2  Language- name 2 objects 2  Language- repeat 1  Language- follow 3 step command 3  Language- read & follow direction 1  Write a sentence 1  Copy design 1  Total score 29        Immunization History  Administered Date(s) Administered  . Influenza Whole 07/03/2011  . Influenza, High Dose Seasonal PF 08/26/2016  . Influenza,inj,Quad PF,36+ Mos 07/06/2013, 07/12/2014, 07/28/2015  . Pneumococcal Conjugate-13 04/04/2017  . Pneumococcal Polysaccharide-23 08/01/2006   Screening Tests Health Maintenance  Topic Date Due  . OPHTHALMOLOGY EXAM  01/22/1951  . TETANUS/TDAP  01/22/1962  . DEXA SCAN  01/23/2008  . MAMMOGRAM  11/03/2012  . COLONOSCOPY  05/06/2013  . INFLUENZA VACCINE  06/01/2017  . FOOT EXAM  07/13/2017  . HEMOGLOBIN A1C  12/14/2017  . PNA vac Low Risk Adult (2 of 2 - PPSV23) 04/04/2018      Plan:     Hemoglobin A1c lab ordered today to evaluate diabetes.  Walker with seat ordered per patient's request to help patient maintain independence with  ADL activities.   Continue doing brain stimulating activities (puzzles, reading, adult coloring books, staying active) to keep memory sharp.   Continue to eat heart healthy diet (full of fruits, vegetables, whole grains, lean protein, water--limit salt, fat, and sugar intake) and increase physical activity as tolerated.   I have personally reviewed and noted the following in the patient's chart:   . Medical and social history . Use of alcohol, tobacco or illicit drugs  . Current medications and supplements . Functional ability and status . Nutritional status . Physical activity . Advanced directives . List of other physicians . Vitals . Screenings to include cognitive, depression, and falls . Referrals and appointments  In addition, I have reviewed and discussed with patient certain preventive protocols, quality metrics, and best practice recommendations. A written personalized care plan for preventive services as well as general preventive health recommendations were provided to patient.     Michiel Cowboy, RN  06/13/2017   Medical screening examination/treatment/procedure(s) were performed by non-physician practitioner and as supervising physician I was immediately available for consultation/collaboration. I agree with above. Walker Kehr, MD

## 2017-06-10 NOTE — Progress Notes (Signed)
Pre visit review using our clinic review tool, if applicable. No additional management support is needed unless otherwise documented below in the visit note. 

## 2017-06-13 ENCOUNTER — Ambulatory Visit (INDEPENDENT_AMBULATORY_CARE_PROVIDER_SITE_OTHER): Payer: Medicare Other | Admitting: *Deleted

## 2017-06-13 ENCOUNTER — Other Ambulatory Visit (INDEPENDENT_AMBULATORY_CARE_PROVIDER_SITE_OTHER): Payer: Medicare Other

## 2017-06-13 VITALS — BP 118/62 | HR 76 | Resp 20 | Ht 63.0 in | Wt 223.0 lb

## 2017-06-13 DIAGNOSIS — E1329 Other specified diabetes mellitus with other diabetic kidney complication: Secondary | ICD-10-CM | POA: Diagnosis not present

## 2017-06-13 DIAGNOSIS — M79604 Pain in right leg: Secondary | ICD-10-CM

## 2017-06-13 DIAGNOSIS — M1991 Primary osteoarthritis, unspecified site: Secondary | ICD-10-CM | POA: Diagnosis not present

## 2017-06-13 DIAGNOSIS — Z0000 Encounter for general adult medical examination without abnormal findings: Secondary | ICD-10-CM | POA: Diagnosis not present

## 2017-06-13 LAB — HEMOGLOBIN A1C: Hgb A1c MFr Bld: 6 % (ref 4.6–6.5)

## 2017-06-13 NOTE — Patient Instructions (Addendum)
[name] Goody MD Doctor in [address], [place] Address: [address], [place], [place] [address] Phone: [phone]  Continue doing brain stimulating activities (puzzles, reading, adult coloring books, staying active) to keep memory sharp.   Continue to eat heart healthy diet (full of fruits, vegetables, whole grains, lean protein, water--limit salt, fat, and sugar intake) and increase physical activity as tolerated.   [name] [name] , Thank you for taking time to come for your Medicare Wellness Visit. I appreciate your ongoing commitment to your health goals. Please review the following plan we discussed and let me know if I can assist you in the future.   These are the goals we discussed: Goals    . increase my strength so I can attend football games       This is a list of the screening recommended for you and due dates:  Health Maintenance  Topic Date Due  . Eye exam for diabetics  01/22/1951  . Tetanus Vaccine  01/22/1962  . DEXA scan (bone density measurement)  01/23/2008  . Hemoglobin A1C  08/17/2010  . Mammogram  11/03/2012  . Colon Cancer Screening  05/06/2013  . Flu Shot  06/01/2017  . Complete foot exam   07/13/2017  . Pneumonia vaccines (2 of 2 - PPSV23) 04/04/2018

## 2017-06-13 NOTE — Addendum Note (Signed)
Addended by: Emelia Loron A on: 06/13/2017 12:43 PM   Modules accepted: Level of Service, SmartSet

## 2017-06-16 ENCOUNTER — Other Ambulatory Visit (HOSPITAL_BASED_OUTPATIENT_CLINIC_OR_DEPARTMENT_OTHER): Payer: Medicare Other

## 2017-06-16 DIAGNOSIS — C7951 Secondary malignant neoplasm of bone: Secondary | ICD-10-CM | POA: Diagnosis not present

## 2017-06-16 DIAGNOSIS — C50911 Malignant neoplasm of unspecified site of right female breast: Secondary | ICD-10-CM | POA: Diagnosis not present

## 2017-06-16 LAB — COMPREHENSIVE METABOLIC PANEL
ALBUMIN: 3.3 g/dL — AB (ref 3.5–5.0)
ALK PHOS: 79 U/L (ref 40–150)
ALT: 13 U/L (ref 0–55)
ANION GAP: 10 meq/L (ref 3–11)
AST: 25 U/L (ref 5–34)
BUN: 20.4 mg/dL (ref 7.0–26.0)
CALCIUM: 11 mg/dL — AB (ref 8.4–10.4)
CHLORIDE: 104 meq/L (ref 98–109)
CO2: 25 mEq/L (ref 22–29)
Creatinine: 1.7 mg/dL — ABNORMAL HIGH (ref 0.6–1.1)
EGFR: 34 mL/min/{1.73_m2} — ABNORMAL LOW (ref >90)
Glucose: 101 mg/dl (ref 70–140)
POTASSIUM: 4.4 meq/L (ref 3.5–5.1)
Sodium: 140 mEq/L (ref 136–145)
Total Bilirubin: 0.87 mg/dL (ref 0.20–1.20)
Total Protein: 8 g/dL (ref 6.4–8.3)

## 2017-06-16 LAB — CBC WITH DIFFERENTIAL/PLATELET
BASO%: 1.1 % (ref 0.0–2.0)
BASOS ABS: 0 10*3/uL (ref 0.0–0.1)
EOS ABS: 0.2 10*3/uL (ref 0.0–0.5)
EOS%: 5.4 % (ref 0.0–7.0)
HEMATOCRIT: 37.5 % (ref 34.8–46.6)
HGB: 12.3 g/dL (ref 11.6–15.9)
LYMPH#: 1 10*3/uL (ref 0.9–3.3)
LYMPH%: 30.7 % (ref 14.0–49.7)
MCH: 29.8 pg (ref 25.1–34.0)
MCHC: 32.9 g/dL (ref 31.5–36.0)
MCV: 90.7 fL (ref 79.5–101.0)
MONO#: 0.3 10*3/uL (ref 0.1–0.9)
MONO%: 9.1 % (ref 0.0–14.0)
NEUT#: 1.7 10*3/uL (ref 1.5–6.5)
NEUT%: 53.7 % (ref 38.4–76.8)
PLATELETS: 305 10*3/uL (ref 145–400)
RBC: 4.14 10*6/uL (ref 3.70–5.45)
RDW: 22.7 % — ABNORMAL HIGH (ref 11.2–14.5)
WBC: 3.2 10*3/uL — ABNORMAL LOW (ref 3.9–10.3)

## 2017-06-20 ENCOUNTER — Other Ambulatory Visit: Payer: Medicare Other

## 2017-06-20 ENCOUNTER — Ambulatory Visit (HOSPITAL_BASED_OUTPATIENT_CLINIC_OR_DEPARTMENT_OTHER): Payer: Medicare Other | Admitting: Internal Medicine

## 2017-06-20 ENCOUNTER — Telehealth: Payer: Self-pay | Admitting: Internal Medicine

## 2017-06-20 ENCOUNTER — Encounter: Payer: Self-pay | Admitting: Internal Medicine

## 2017-06-20 VITALS — BP 144/52 | HR 90 | Temp 98.4°F | Resp 18 | Ht 63.0 in | Wt 224.1 lb

## 2017-06-20 DIAGNOSIS — R911 Solitary pulmonary nodule: Secondary | ICD-10-CM

## 2017-06-20 DIAGNOSIS — C7951 Secondary malignant neoplasm of bone: Secondary | ICD-10-CM | POA: Diagnosis not present

## 2017-06-20 DIAGNOSIS — R0609 Other forms of dyspnea: Secondary | ICD-10-CM

## 2017-06-20 DIAGNOSIS — C50911 Malignant neoplasm of unspecified site of right female breast: Secondary | ICD-10-CM | POA: Diagnosis not present

## 2017-06-20 DIAGNOSIS — M545 Low back pain: Secondary | ICD-10-CM

## 2017-06-20 DIAGNOSIS — Z5181 Encounter for therapeutic drug level monitoring: Secondary | ICD-10-CM

## 2017-06-20 NOTE — Progress Notes (Signed)
[place] [phone]   Fax:([phone]  OFFICE PROGRESS NOTE  [name], [name], MD [place] [address]  DIAGNOSIS: Metastatic breast adenocarcinoma presented with bone metastasis in the left hip area this was initially diagnosed as a stage II (T2, N1, M0) invasive right breast carcinoma with positive estrogen and progesterone receptor as well as HER-2/neu in July 2002  PRIOR THERAPY: 1. Status post right lumpectomy with right axillary lymph node dissection revealing 4/9 lymph nodes that were positive for malignancy.  2. Status post 8 cycles of systemic chemotherapy with CMF, completed March 2003.  3. Status post radiotherapy to the remaining right breast under the care of Dr. Tammi Klippel.  4. Status post 2 years of treatment with tamoxifen, started July 2003, then switched to Femara by Dr. Humphrey Rolls in August 2005. The patient discontinued the treatment in February 2009.  5. Status post left total hip replacement on Apr 01, 2011, secondary to pathologic left femoral neck fracture. 6. Zometa 4 mg IV on 3 monthly basis. Discontinued secondary to renal insufficiency. 7.  Arimidex 1 mg by mouth daily started 03/18/2011.   CURRENT THERAPY: 1. Arimidex 1 mg by mouth daily and Ibrance 125 mg by mouth daily for 21 days every 4 weeks. First dose started 04/25/2017. 2. Xgeva 120 g subcutaneously every 3 months.  INTERVAL HISTORY: [name] [name] 76 y.o. female returns to the clinic today for follow-up visit. The patient is currently on treatment with Arimidex and Ibrance and she is tolerating her treatment well. Her treatment was on hold for one week because of several episodes of diarrhea but she resumed it with no other complaints. She denied having any chest pain but has shortness breath with exertion with no cough or hemoptysis. She denied having any fever or chills. She continues to have aching pain in the lower back and in the knees. She was  seen by Dr. Tammi Klippel for consideration of palliative radiotherapy to the T10 vertebral body lesion but he felt that it was degenerative disease and metastasis. She is here today for evaluation and repeat blood work.   MEDICAL HISTORY: Past Medical History:  Diagnosis Date  . Anxiety    hx of  . Asthma   . Breast cancer ([place])    2002; metastatic in 2012, right breast, spread to left hip in 2012  . Cerebrovascular accident [facility])    R thalamic CVA 01/2010  . Dyslipidemia   . Encounter for therapeutic drug monitoring 04/14/2017  . Goals of care, counseling/discussion 04/19/2017  . Hyperlipemia   . Hypertension   . Low back pain   . Osteoarthritis   . TIA (transient ischemic attack)    x2 in 2011  . Vertigo 2014    ALLERGIES:  has No Known Allergies.  MEDICATIONS:  Current Outpatient Prescriptions  Medication Sig Dispense Refill  . ALPRAZolam (XANAX) 0.5 MG tablet Take 1 tablet (0.5 mg total) by mouth 2 (two) times daily as needed for anxiety or sleep. 60 tablet 2  . amLODipine (NORVASC) 10 MG tablet Take 0.5 tablets (5 mg total) by mouth daily. 90 tablet 3  . anastrozole (ARIMIDEX) 1 MG tablet Take 1 tablet (1 mg total) by mouth daily. 90 tablet 2  . Cholecalciferol (VITAMIN D3) 1000 UNITS CAPS Take 1 capsule by mouth daily.     Marland Kitchen losartan-hydrochlorothiazide (HYZAAR) 100-25 MG tablet Take 1 tablet by mouth daily. 90 tablet 3  . meclizine (ANTIVERT) 12.5 MG tablet  Take 1 tablet (12.5 mg total) by mouth 2 (two) times daily as needed for dizziness. 10 tablet 0  . palbociclib (IBRANCE) 125 MG capsule Take 1 capsule (125 mg total) by mouth daily with breakfast. Take whole with food. 21 capsule 2  . spironolactone (ALDACTONE) 50 MG tablet Take 1 tablet (50 mg total) by mouth daily. 90 tablet 3  . tiZANidine (ZANAFLEX) 4 MG tablet Take 4 mg by mouth every 6 (six) hours as needed for muscle spasms.     No current facility-administered medications for this visit.    Facility-Administered  Medications Ordered in Other Visits  Medication Dose Route Frequency Provider Last Rate Last Dose  . alteplase (CATHFLO ACTIVASE) injection 2 mg  2 mg Intracatheter Once PRN Curt Bears, MD      . denosumab (XGEVA) injection 120 mg  120 mg Subcutaneous Once Curt Bears, MD        SURGICAL HISTORY:  Past Surgical History:  Procedure Laterality Date  . ABDOMINAL HYSTERECTOMY     complete  . BREAST LUMPECTOMY     Right breast 2002  . JOINT REPLACEMENT  2012   L hip due to breast ca met  . TOTAL HIP ARTHROPLASTY Right 10/28/2015   Procedure: RIGHT TOTAL HIP ARTHROPLASTY ANTERIOR APPROACH;  Surgeon: Paralee Cancel, MD;  Location: [facility];  Service: Orthopedics;  Laterality: Right;    REVIEW OF SYSTEMS:  A comprehensive review of systems was negative except for: Constitutional: positive for fatigue Respiratory: positive for dyspnea on exertion Musculoskeletal: positive for arthralgias   PHYSICAL EXAMINATION: General appearance: alert, cooperative, fatigued and no distress Head: Normocephalic, without obvious abnormality, atraumatic Neck: no adenopathy, no JVD, supple, symmetrical, trachea midline and thyroid not enlarged, symmetric, no tenderness/mass/nodules Lymph nodes: Cervical, supraclavicular, and axillary nodes normal. Resp: clear to auscultation bilaterally Back: symmetric, no curvature. ROM normal. No CVA tenderness. Cardio: regular rate and rhythm, S1, S2 normal, no murmur, click, rub or gallop GI: soft, non-tender; bowel sounds normal; no masses,  no organomegaly Extremities: extremities normal, atraumatic, no cyanosis or edema  ECOG PERFORMANCE STATUS: 1 - Symptomatic but completely ambulatory  Blood pressure (!) 144/52, pulse 90, temperature 98.4 F (36.9 C), temperature source Oral, resp. rate 18, height _0  (1.6 m), weight 224 lb 1.6 oz (101.7 kg), SpO2 100 %.  LABORATORY DATA: Lab Results  Component Value Date   WBC 3.2 (L) 06/16/2017   HGB 12.3 06/16/2017     HCT 37.5 06/16/2017   MCV 90.7 06/16/2017   PLT 305 06/16/2017      Chemistry      Component Value Date/Time   NA 140 06/16/2017 0812   K 4.4 06/16/2017 0812   CL 105 03/02/2016 0834   CL 109 (H) 04/25/2013 1023   CO2 25 06/16/2017 0812   BUN 20.4 06/16/2017 0812   CREATININE 1.7 (H) 06/16/2017 0812      Component Value Date/Time   CALCIUM 11.0 (H) 06/16/2017 0812   ALKPHOS 79 06/16/2017 0812   AST 25 06/16/2017 0812   ALT 13 06/16/2017 0812   BILITOT 0.87 06/16/2017 0812       RADIOGRAPHIC STUDIES: No results found.  ASSESSMENT AND PLAN:  This is a very pleasant 76 years old African-American female with metastatic breast carcinoma status post treatment with Arimidex 1 mg by mouth daily since May 2012 She had recent imaging studies including CT scan of the chest, abdomen and pelvis as well as bone scan. I personally and independently reviewed the scan images  and discuss the results with the patient. Unfortunately her scan showed evidence for disease progression with new multiple pulmonary nodules in addition to questionable metastatic disease to the T10 vertebra. The patient is currently on treatment with Arimidex and Ibrance and tolerating her treatment well. I recommended for her to continue her current treatment as scheduled and I would see her back for follow-up visit in one month's for reevaluation with repeat CT scan of the chest, abdomen and pelvis for restaging of her disease. For the metastatic bone disease, she will continue Xgeva every 3 months. The patient was advised to call immediately if she has any concerning symptoms in the interval. The patient voices understanding of current disease status and treatment options and is in agreement with the current care plan. All questions were answered. The patient knows to call the clinic with any problems, questions or concerns. We can certainly see the patient much sooner if necessary.  Disclaimer: This note was  dictated with voice recognition software. Similar sounding words can inadvertently be transcribed and may not be corrected upon review.

## 2017-06-20 NOTE — Telephone Encounter (Signed)
Gave pt avs and calendar with upcoming appts.   Gave her info for her CT scan.

## 2017-06-21 MED FILL — IBRANCE 125 MG CAPSULE: 125 | 28 days supply | Qty: 21 | Fill #2

## 2017-07-11 ENCOUNTER — Encounter (HOSPITAL_COMMUNITY): Payer: Self-pay

## 2017-07-11 ENCOUNTER — Ambulatory Visit (HOSPITAL_COMMUNITY)
Admission: RE | Admit: 2017-07-11 | Discharge: 2017-07-11 | Disposition: A | Discharge status: Home / Self Care | Payer: Medicare Other | Source: Ambulatory Visit | Attending: Internal Medicine | Admitting: Internal Medicine

## 2017-07-11 DIAGNOSIS — C50911 Malignant neoplasm of unspecified site of right female breast: Secondary | ICD-10-CM | POA: Insufficient documentation

## 2017-07-11 DIAGNOSIS — C7951 Secondary malignant neoplasm of bone: Secondary | ICD-10-CM | POA: Diagnosis present

## 2017-07-11 DIAGNOSIS — I2510 Atherosclerotic heart disease of native coronary artery without angina pectoris: Secondary | ICD-10-CM | POA: Diagnosis not present

## 2017-07-11 DIAGNOSIS — R918 Other nonspecific abnormal finding of lung field: Secondary | ICD-10-CM | POA: Diagnosis not present

## 2017-07-11 DIAGNOSIS — R590 Localized enlarged lymph nodes: Secondary | ICD-10-CM | POA: Insufficient documentation

## 2017-07-11 DIAGNOSIS — I2584 Coronary atherosclerosis due to calcified coronary lesion: Secondary | ICD-10-CM | POA: Diagnosis not present

## 2017-07-11 DIAGNOSIS — Z5181 Encounter for therapeutic drug level monitoring: Secondary | ICD-10-CM

## 2017-07-11 DIAGNOSIS — I700 Atherosclerosis of aorta: Secondary | ICD-10-CM | POA: Insufficient documentation

## 2017-07-11 MED ORDER — IOPAMIDOL (ISOVUE-300) INJECTION 61%
100.0000 mL | Freq: Once | INTRAVENOUS | Status: AC | PRN
  Administered 2017-07-11: 80 mL via INTRAVENOUS

## 2017-07-11 MED ORDER — HEPARIN SOD (PORK) LOCK FLUSH 100 UNIT/ML IV SOLN
500.0000 [IU] | Freq: Once | INTRAVENOUS | Status: AC
  Administered 2017-07-11: 500 [IU] via INTRAVENOUS

## 2017-07-11 MED ORDER — IOPAMIDOL (ISOVUE-300) INJECTION 61%
INTRAVENOUS | Status: AC
  Administered 2017-07-11: 80 mL via INTRAVENOUS
  Filled 2017-07-11: qty 100

## 2017-07-11 MED ORDER — HEPARIN SOD (PORK) LOCK FLUSH 100 UNIT/ML IV SOLN
INTRAVENOUS | Status: AC
  Administered 2017-07-11: 500 [IU] via INTRAVENOUS
  Filled 2017-07-11: qty 5

## 2017-07-12 ENCOUNTER — Other Ambulatory Visit: Payer: Self-pay | Admitting: Internal Medicine

## 2017-07-12 DIAGNOSIS — C7951 Secondary malignant neoplasm of bone: Principal | ICD-10-CM

## 2017-07-12 DIAGNOSIS — C50911 Malignant neoplasm of unspecified site of right female breast: Secondary | ICD-10-CM

## 2017-07-12 MED FILL — ANASTROZOLE 1 MG TABLET: 1 | 90 days supply | Qty: 90 | Fill #1

## 2017-07-12 MED FILL — IBRANCE 125 MG CAPSULE: 125 | 28 days supply | Qty: 21 | Fill #0

## 2017-07-14 ENCOUNTER — Telehealth: Payer: Self-pay | Admitting: Internal Medicine

## 2017-07-14 ENCOUNTER — Ambulatory Visit (HOSPITAL_BASED_OUTPATIENT_CLINIC_OR_DEPARTMENT_OTHER): Payer: Medicare Other | Admitting: Internal Medicine

## 2017-07-14 ENCOUNTER — Other Ambulatory Visit (HOSPITAL_BASED_OUTPATIENT_CLINIC_OR_DEPARTMENT_OTHER): Payer: Medicare Other

## 2017-07-14 ENCOUNTER — Encounter: Payer: Self-pay | Admitting: Internal Medicine

## 2017-07-14 VITALS — BP 172/55 | HR 79 | Temp 98.2°F | Resp 20 | Ht 63.0 in | Wt 226.9 lb

## 2017-07-14 DIAGNOSIS — M1990 Unspecified osteoarthritis, unspecified site: Secondary | ICD-10-CM

## 2017-07-14 DIAGNOSIS — Z95828 Presence of other vascular implants and grafts: Secondary | ICD-10-CM

## 2017-07-14 DIAGNOSIS — R200 Anesthesia of skin: Secondary | ICD-10-CM

## 2017-07-14 DIAGNOSIS — C7951 Secondary malignant neoplasm of bone: Secondary | ICD-10-CM | POA: Diagnosis not present

## 2017-07-14 DIAGNOSIS — Z5181 Encounter for therapeutic drug level monitoring: Secondary | ICD-10-CM

## 2017-07-14 DIAGNOSIS — R5383 Other fatigue: Secondary | ICD-10-CM | POA: Diagnosis not present

## 2017-07-14 DIAGNOSIS — M25561 Pain in right knee: Secondary | ICD-10-CM

## 2017-07-14 DIAGNOSIS — I10 Essential (primary) hypertension: Secondary | ICD-10-CM

## 2017-07-14 DIAGNOSIS — C50911 Malignant neoplasm of unspecified site of right female breast: Secondary | ICD-10-CM | POA: Diagnosis not present

## 2017-07-14 LAB — CBC WITH DIFFERENTIAL/PLATELET
BASO%: 1.7 % (ref 0.0–2.0)
Basophils Absolute: 0 10*3/uL (ref 0.0–0.1)
EOS ABS: 0 10*3/uL (ref 0.0–0.5)
EOS%: 1.7 % (ref 0.0–7.0)
HCT: 30.1 % — ABNORMAL LOW (ref 34.8–46.6)
HGB: 10 g/dL — ABNORMAL LOW (ref 11.6–15.9)
LYMPH%: 39.3 % (ref 14.0–49.7)
MCH: 31.8 pg (ref 25.1–34.0)
MCHC: 33.2 g/dL (ref 31.5–36.0)
MCV: 95.9 fL (ref 79.5–101.0)
MONO#: 0.1 10*3/uL (ref 0.1–0.9)
MONO%: 4.5 % (ref 0.0–14.0)
NEUT%: 52.8 % (ref 38.4–76.8)
NEUTROS ABS: 0.9 10*3/uL — AB (ref 1.5–6.5)
NRBC: 0 % (ref 0–0)
PLATELETS: 120 10*3/uL — AB (ref 145–400)
RBC: 3.14 10*6/uL — AB (ref 3.70–5.45)
RDW: 21.4 % — AB (ref 11.2–14.5)
WBC: 1.8 10*3/uL — AB (ref 3.9–10.3)
lymph#: 0.7 10*3/uL — ABNORMAL LOW (ref 0.9–3.3)

## 2017-07-14 LAB — COMPREHENSIVE METABOLIC PANEL
ALT: 12 U/L (ref 0–55)
AST: 20 U/L (ref 5–34)
Albumin: 3.4 g/dL — ABNORMAL LOW (ref 3.5–5.0)
Alkaline Phosphatase: 73 U/L (ref 40–150)
Anion Gap: 9 mEq/L (ref 3–11)
BILIRUBIN TOTAL: 0.46 mg/dL (ref 0.20–1.20)
BUN: 14.2 mg/dL (ref 7.0–26.0)
CO2: 23 mEq/L (ref 22–29)
CREATININE: 1.6 mg/dL — AB (ref 0.6–1.1)
Calcium: 9.3 mg/dL (ref 8.4–10.4)
Chloride: 107 mEq/L (ref 98–109)
EGFR: 37 mL/min/{1.73_m2} — ABNORMAL LOW (ref >90)
GLUCOSE: 98 mg/dL (ref 70–140)
Potassium: 4.1 mEq/L (ref 3.5–5.1)
SODIUM: 139 meq/L (ref 136–145)
TOTAL PROTEIN: 7.7 g/dL (ref 6.4–8.3)

## 2017-07-14 MED ORDER — DENOSUMAB 120 MG/1.7ML ~~LOC~~ SOLN
120.0000 mg | Freq: Once | SUBCUTANEOUS | Status: AC
  Administered 2017-07-14: 120 mg via SUBCUTANEOUS
  Filled 2017-07-14: qty 1.7

## 2017-07-14 NOTE — Progress Notes (Signed)
[place] [phone]   Fax:([phone]) [phone]  OFFICE PROGRESS NOTE  [name], [name], MD [place] [address]  DIAGNOSIS: Metastatic breast adenocarcinoma presented with bone metastasis in the left hip area this was initially diagnosed as a stage II (T2, N1, M0) invasive right breast carcinoma with positive estrogen and progesterone receptor as well as HER-2/neu in July 2002  PRIOR THERAPY: 1. Status post right lumpectomy with right axillary lymph node dissection revealing 4/9 lymph nodes that were positive for malignancy.  2. Status post 8 cycles of systemic chemotherapy with CMF, completed March 2003.  3. Status post radiotherapy to the remaining right breast under the care of Dr. Tammi Klippel.  4. Status post 2 years of treatment with tamoxifen, started July 2003, then switched to Femara by Dr. Humphrey Rolls in August 2005. The patient discontinued the treatment in February 2009.  5. Status post left total hip replacement on Apr 01, 2011, secondary to pathologic left femoral neck fracture. 6. Zometa 4 mg IV on 3 monthly basis. Discontinued secondary to renal insufficiency. 7.  Arimidex 1 mg by mouth daily started 03/18/2011.   CURRENT THERAPY: 1. Arimidex 1 mg by mouth daily and Ibrance 125 mg by mouth daily for 21 days every 4 weeks. First dose started 04/25/2017. Leslee Home will be changed to 100 mg starting 07/21/2017. 2. Xgeva 120 g subcutaneously every 3 months.  INTERVAL HISTORY: [name] [name] 76 y.o. female returns to the clinic today for follow-up visit. The patient is feeling fine with no specific complaints except for fatigue as well as arthritis. She also has numbness and tingling in the right hand. She plays game most of the time in using this hand. This is questionable to be carpal tunnel syndrome. She denied having any current chest pain, shortness breath except with exertion and no cough or hemoptysis. She has no recent weight loss or  night sweats. She has no nausea, vomiting, diarrhea or constipation. She has been tolerating her treatment with Arimidex and Ibrance fairly well. She had repeat CT scan of the chest, abdomen and pelvis performed recently and she is here for evaluation and discussion of her scan results and treatment options.   MEDICAL HISTORY: Past Medical History:  Diagnosis Date  . Anxiety    hx of  . Asthma   . Breast cancer ([place])    2002; metastatic in 2012, right breast, spread to left hip in 2012  . Cerebrovascular accident [facility])    R thalamic CVA 01/2010  . Dyslipidemia   . Encounter for therapeutic drug monitoring 04/14/2017  . Goals of care, counseling/discussion 04/19/2017  . Hyperlipemia   . Hypertension   . Low back pain   . Osteoarthritis   . TIA (transient ischemic attack)    x2 in 2011  . Vertigo 2014    ALLERGIES:  has No Known Allergies.  MEDICATIONS:  Current Outpatient Prescriptions  Medication Sig Dispense Refill  . ALPRAZolam (XANAX) 0.5 MG tablet Take 1 tablet (0.5 mg total) by mouth 2 (two) times daily as needed for anxiety or sleep. 60 tablet 2  . amLODipine (NORVASC) 10 MG tablet Take 0.5 tablets (5 mg total) by mouth daily. 90 tablet 3  . anastrozole (ARIMIDEX) 1 MG tablet Take 1 tablet (1 mg total) by mouth daily. 90 tablet 2  . Cholecalciferol (VITAMIN D3) 1000 UNITS CAPS Take 1 capsule by mouth daily.     Leslee Home 125 MG capsule TAKE 1 CAPSULE (  125 MG TOTAL) BY MOUTH DAILY WITH BREAKFAST. TAKE WHOLE WITH FOOD. 21 capsule 2  . losartan-hydrochlorothiazide (HYZAAR) 100-25 MG tablet Take 1 tablet by mouth daily. 90 tablet 3  . meclizine (ANTIVERT) 12.5 MG tablet Take 1 tablet (12.5 mg total) by mouth 2 (two) times daily as needed for dizziness. 10 tablet 0  . tiZANidine (ZANAFLEX) 4 MG tablet Take 4 mg by mouth every 6 (six) hours as needed for muscle spasms.    Marland Kitchen spironolactone (ALDACTONE) 50 MG tablet Take 1 tablet (50 mg total) by mouth daily. 90 tablet 3   No  current facility-administered medications for this visit.    Facility-Administered Medications Ordered in Other Visits  Medication Dose Route Frequency Provider Last Rate Last Dose  . alteplase (CATHFLO ACTIVASE) injection 2 mg  2 mg Intracatheter Once PRN Curt Bears, MD      . denosumab (XGEVA) injection 120 mg  120 mg Subcutaneous Once Curt Bears, MD        SURGICAL HISTORY:  Past Surgical History:  Procedure Laterality Date  . ABDOMINAL HYSTERECTOMY     complete  . BREAST LUMPECTOMY     Right breast 2002  . JOINT REPLACEMENT  2012   L hip due to breast ca met  . TOTAL HIP ARTHROPLASTY Right 10/28/2015   Procedure: RIGHT TOTAL HIP ARTHROPLASTY ANTERIOR APPROACH;  Surgeon: Paralee Cancel, MD;  Location: [facility];  Service: Orthopedics;  Laterality: Right;    REVIEW OF SYSTEMS:  Constitutional: positive for fatigue Eyes: negative Ears, nose, mouth, throat, and face: negative Respiratory: positive for dyspnea on exertion Cardiovascular: negative Gastrointestinal: negative Genitourinary:negative Integument/breast: negative Hematologic/lymphatic: negative Musculoskeletal:positive for arthralgias and back pain Neurological: negative Behavioral/Psych: negative Endocrine: negative Allergic/Immunologic: negative   PHYSICAL EXAMINATION: General appearance: alert, cooperative, fatigued and no distress Head: Normocephalic, without obvious abnormality, atraumatic Neck: no adenopathy, no JVD, supple, symmetrical, trachea midline and thyroid not enlarged, symmetric, no tenderness/mass/nodules Lymph nodes: Cervical, supraclavicular, and axillary nodes normal. Resp: clear to auscultation bilaterally Back: symmetric, no curvature. ROM normal. No CVA tenderness. Cardio: regular rate and rhythm, S1, S2 normal, no murmur, click, rub or gallop GI: soft, non-tender; bowel sounds normal; no masses,  no organomegaly Extremities: extremities normal, atraumatic, no cyanosis or  edema Neurologic: Alert and oriented X 3, normal strength and tone. Normal symmetric reflexes. Normal coordination and gait  ECOG PERFORMANCE STATUS: 1 - Symptomatic but completely ambulatory  Blood pressure (!) 172/55, pulse 79, temperature 98.2 F (36.8 C), temperature source Oral, resp. rate 20, height 5' 3"  (1.6 m), weight 226 lb 14.4 oz (102.9 kg), SpO2 100 %.  LABORATORY DATA: Lab Results  Component Value Date   WBC 1.8 (L) 07/14/2017   HGB 10.0 (L) 07/14/2017   HCT 30.1 (L) 07/14/2017   MCV 95.9 07/14/2017   PLT 120 (L) 07/14/2017      Chemistry      Component Value Date/Time   NA 140 06/16/2017 0812   K 4.4 06/16/2017 0812   CL 105 03/02/2016 0834   CL 109 (H) 04/25/2013 1023   CO2 25 06/16/2017 0812   BUN 20.4 06/16/2017 0812   CREATININE 1.7 (H) 06/16/2017 0812      Component Value Date/Time   CALCIUM 11.0 (H) 06/16/2017 0812   ALKPHOS 79 06/16/2017 0812   AST 25 06/16/2017 0812   ALT 13 06/16/2017 0812   BILITOT 0.87 06/16/2017 0812       RADIOGRAPHIC STUDIES: Ct Chest W Contrast  Result Date: 07/11/2017 CLINICAL DATA:  Metastatic breast cancer. EXAM: CT CHEST, ABDOMEN, AND PELVIS WITH CONTRAST TECHNIQUE: Multidetector CT imaging of the chest, abdomen and pelvis was performed following the standard protocol during bolus administration of intravenous contrast. CONTRAST:  80 cc of Isovue-300 COMPARISON:  04/15/2017 FINDINGS: CT CHEST FINDINGS Cardiovascular: Normal heart size. No pericardial effusion. Aortic atherosclerosis. Calcification in the LAD coronary artery is identified. Mediastinum/Nodes: The trachea appears patent and is midline. Normal appearance of the esophagus. 0.7 cm right paratracheal lymph node is stable from previous exam. Stable 0.9 cm sub- carinal lymph node. Right hilar lymph node is unchanged measuring 1.3 cm. Lungs/Pleura: No pleural effusion. Multiple pulmonary nodules are identified index lesion within the left upper lobe measures 0.8 cm,  image 53 of series 5. Previously 1.1 cm. Left lower lobe pulmonary nodule measures 0.9 cm, image 83 of series 5. Previously 1.1 cm. Subpleural nodule within the medial left lower lobe measures 1.2 cm, previously 1.2 cm. Musculoskeletal: Sclerotic lesion involving T10 appears increased in size from previous study. Currently 2.3 cm (image 98 of series 7) versus 1.7 cm previously. CT ABDOMEN PELVIS FINDINGS Hepatobiliary: No focal liver abnormality. Multiple stones identified within the gallbladder. No biliary dilatation. Pancreas: The pancreas is normal. Spleen: Normal in size without focal abnormality. Adrenals/Urinary Tract: The adrenal glands are normal no focal kidney abnormalities. No mass or hydronephrosis identified. Stomach/Bowel: Small hiatal hernia. The small bowel loops have a normal course and caliber. No obstruction. No pathologic dilatation of the colon. Distal colonic diverticula noted without acute inflammation. Vascular/Lymphatic: Aortic atherosclerosis. No aneurysm. No enlarged upper abdominal lymph nodes. No pelvic or inguinal adenopathy. Reproductive: Status post hysterectomy. No adnexal masses. Other: No abdominal wall hernia or abnormality. No abdominopelvic ascites. Musculoskeletal: There is degenerative disc disease identified. Stable sclerotic lesion involving the L3 vertebra. Previous bilateral hip arthroplasty. IMPRESSION: 1. Stable appearance of borderline mediastinal and hilar lymph nodes. 2. Pulmonary nodules are either stable or slightly decreased in size in the interval. No progressive nodularity identified. 3. The sclerotic metastasis involving the T10 vertebra is slightly increased in size in the interval. Stable L3 sclerotic lesion. 4. Aortic Atherosclerosis (ICD10-I70.0). LAD coronary artery calcifications noted Electronically Signed   By: Kerby Moors M.D.   On: 07/11/2017 14:10   Ct Abdomen Pelvis W Contrast  Result Date: 07/11/2017 CLINICAL DATA:  Metastatic breast cancer.  EXAM: CT CHEST, ABDOMEN, AND PELVIS WITH CONTRAST TECHNIQUE: Multidetector CT imaging of the chest, abdomen and pelvis was performed following the standard protocol during bolus administration of intravenous contrast. CONTRAST:  80 cc of Isovue-300 COMPARISON:  04/15/2017 FINDINGS: CT CHEST FINDINGS Cardiovascular: Normal heart size. No pericardial effusion. Aortic atherosclerosis. Calcification in the LAD coronary artery is identified. Mediastinum/Nodes: The trachea appears patent and is midline. Normal appearance of the esophagus. 0.7 cm right paratracheal lymph node is stable from previous exam. Stable 0.9 cm sub- carinal lymph node. Right hilar lymph node is unchanged measuring 1.3 cm. Lungs/Pleura: No pleural effusion. Multiple pulmonary nodules are identified index lesion within the left upper lobe measures 0.8 cm, image 53 of series 5. Previously 1.1 cm. Left lower lobe pulmonary nodule measures 0.9 cm, image 83 of series 5. Previously 1.1 cm. Subpleural nodule within the medial left lower lobe measures 1.2 cm, previously 1.2 cm. Musculoskeletal: Sclerotic lesion involving T10 appears increased in size from previous study. Currently 2.3 cm (image 98 of series 7) versus 1.7 cm previously. CT ABDOMEN PELVIS FINDINGS Hepatobiliary: No focal liver abnormality. Multiple stones identified within the gallbladder. No biliary dilatation.  Pancreas: The pancreas is normal. Spleen: Normal in size without focal abnormality. Adrenals/Urinary Tract: The adrenal glands are normal no focal kidney abnormalities. No mass or hydronephrosis identified. Stomach/Bowel: Small hiatal hernia. The small bowel loops have a normal course and caliber. No obstruction. No pathologic dilatation of the colon. Distal colonic diverticula noted without acute inflammation. Vascular/Lymphatic: Aortic atherosclerosis. No aneurysm. No enlarged upper abdominal lymph nodes. No pelvic or inguinal adenopathy. Reproductive: Status post hysterectomy. No  adnexal masses. Other: No abdominal wall hernia or abnormality. No abdominopelvic ascites. Musculoskeletal: There is degenerative disc disease identified. Stable sclerotic lesion involving the L3 vertebra. Previous bilateral hip arthroplasty. IMPRESSION: 1. Stable appearance of borderline mediastinal and hilar lymph nodes. 2. Pulmonary nodules are either stable or slightly decreased in size in the interval. No progressive nodularity identified. 3. The sclerotic metastasis involving the T10 vertebra is slightly increased in size in the interval. Stable L3 sclerotic lesion. 4. Aortic Atherosclerosis (ICD10-I70.0). LAD coronary artery calcifications noted Electronically Signed   By: Kerby Moors M.D.   On: 07/11/2017 14:10    ASSESSMENT AND PLAN:  This is a very pleasant 76 years old African-American female with metastatic breast carcinoma status post treatment with Arimidex 1 mg by mouth daily since May 2012 She had recent imaging studies including CT scan of the chest, abdomen and pelvis as well as bone scan. I personally and independently reviewed the scan images and discuss the results with the patient. Unfortunately her scan showed evidence for disease progression with new multiple pulmonary nodules in addition to questionable metastatic disease to the T10 vertebra. The patient was started on treatment with Arimidex and Ibrance and has been tolerating this treatment well except for pancytopenia. Her recent CT scan of the chest showed stable disease with some improvement in the pulmonary nodules. I recommended for the patient to continue the same treatment regimen but I will reduce the dose of Ibrance to 100 mg by mouth daily for 21 days every 4 weeks. She will start the first dose of the treatment next week after improvement of her absolute neutrophil count. For the metastatic bone disease, the patient will continue her current treatment with Xgeva every 3 months. I will see the patient back for  follow-up visit in one month's for evaluation and repeat blood work. She was advised to call immediately if she has any concerning symptoms in the interval. The patient voices understanding of current disease status and treatment options and is in agreement with the current care plan. All questions were answered. The patient knows to call the clinic with any problems, questions or concerns. We can certainly see the patient much sooner if necessary.  Disclaimer: This note was dictated with voice recognition software. Similar sounding words can inadvertently be transcribed and may not be corrected upon review.

## 2017-07-14 NOTE — Telephone Encounter (Signed)
Scheduled appt per 9/13 los - Gave patient AVS and calender per los.  

## 2017-07-15 LAB — CANCER ANTIGEN 27.29: CA 27.29: 106.5 U/mL — ABNORMAL HIGH (ref 0.0–38.6)

## 2017-07-21 ENCOUNTER — Other Ambulatory Visit: Payer: Medicare Other

## 2017-07-22 ENCOUNTER — Telehealth: Payer: Self-pay | Admitting: Internal Medicine

## 2017-07-22 MED ORDER — AMLODIPINE BESYLATE 10 MG PO TABS: 5.0000 mg | ORAL_TABLET | Freq: Every day | ORAL | 1 refills | Status: DC

## 2017-07-22 NOTE — Telephone Encounter (Signed)
Pt called in and needs refill on her  amLODipine (NORVASC) 10 MG tablet [[id number]]   [place]

## 2017-07-22 NOTE — Telephone Encounter (Signed)
Reviewed chart pt is up-to-date sent refills to [facility].../lmb  

## 2017-07-26 ENCOUNTER — Ambulatory Visit: Payer: Medicare Other | Admitting: Internal Medicine

## 2017-08-15 ENCOUNTER — Other Ambulatory Visit (HOSPITAL_BASED_OUTPATIENT_CLINIC_OR_DEPARTMENT_OTHER): Payer: Medicare Other

## 2017-08-15 ENCOUNTER — Other Ambulatory Visit: Payer: Self-pay | Admitting: Medical Oncology

## 2017-08-15 ENCOUNTER — Ambulatory Visit (HOSPITAL_BASED_OUTPATIENT_CLINIC_OR_DEPARTMENT_OTHER): Payer: Medicare Other

## 2017-08-15 ENCOUNTER — Ambulatory Visit (HOSPITAL_BASED_OUTPATIENT_CLINIC_OR_DEPARTMENT_OTHER): Payer: Medicare Other | Admitting: Internal Medicine

## 2017-08-15 ENCOUNTER — Telehealth: Payer: Self-pay | Admitting: Internal Medicine

## 2017-08-15 ENCOUNTER — Encounter: Payer: Self-pay | Admitting: Internal Medicine

## 2017-08-15 VITALS — BP 157/72 | HR 74 | Temp 98.0°F | Resp 18 | Ht 63.0 in | Wt 225.3 lb

## 2017-08-15 DIAGNOSIS — D701 Agranulocytosis secondary to cancer chemotherapy: Secondary | ICD-10-CM | POA: Diagnosis not present

## 2017-08-15 DIAGNOSIS — R911 Solitary pulmonary nodule: Secondary | ICD-10-CM

## 2017-08-15 DIAGNOSIS — Z23 Encounter for immunization: Secondary | ICD-10-CM

## 2017-08-15 DIAGNOSIS — Z5181 Encounter for therapeutic drug level monitoring: Secondary | ICD-10-CM

## 2017-08-15 DIAGNOSIS — C50911 Malignant neoplasm of unspecified site of right female breast: Secondary | ICD-10-CM | POA: Diagnosis not present

## 2017-08-15 DIAGNOSIS — M25561 Pain in right knee: Secondary | ICD-10-CM

## 2017-08-15 DIAGNOSIS — C7951 Secondary malignant neoplasm of bone: Secondary | ICD-10-CM

## 2017-08-15 DIAGNOSIS — R0609 Other forms of dyspnea: Secondary | ICD-10-CM

## 2017-08-15 DIAGNOSIS — T451X5A Adverse effect of antineoplastic and immunosuppressive drugs, initial encounter: Secondary | ICD-10-CM

## 2017-08-15 HISTORY — DX: Agranulocytosis secondary to cancer chemotherapy: T45.1X5A

## 2017-08-15 HISTORY — DX: Agranulocytosis secondary to cancer chemotherapy: D70.1

## 2017-08-15 LAB — COMPREHENSIVE METABOLIC PANEL
ALT: 18 U/L (ref 0–55)
AST: 28 U/L (ref 5–34)
Albumin: 3.6 g/dL (ref 3.5–5.0)
Alkaline Phosphatase: 64 U/L (ref 40–150)
Anion Gap: 10 mEq/L (ref 3–11)
BUN: 15.1 mg/dL (ref 7.0–26.0)
CHLORIDE: 108 meq/L (ref 98–109)
CO2: 23 mEq/L (ref 22–29)
Calcium: 9.4 mg/dL (ref 8.4–10.4)
Creatinine: 1.4 mg/dL — ABNORMAL HIGH (ref 0.6–1.1)
EGFR: 43 mL/min/{1.73_m2} — ABNORMAL LOW (ref >60)
GLUCOSE: 97 mg/dL (ref 70–140)
POTASSIUM: 4.4 meq/L (ref 3.5–5.1)
SODIUM: 141 meq/L (ref 136–145)
Total Bilirubin: 0.76 mg/dL (ref 0.20–1.20)
Total Protein: 7.7 g/dL (ref 6.4–8.3)

## 2017-08-15 LAB — CBC WITH DIFFERENTIAL/PLATELET
BASO%: 2.4 % — AB (ref 0.0–2.0)
Basophils Absolute: 0 10*3/uL (ref 0.0–0.1)
EOS%: 0.6 % (ref 0.0–7.0)
Eosinophils Absolute: 0 10*3/uL (ref 0.0–0.5)
HCT: 30.8 % — ABNORMAL LOW (ref 34.8–46.6)
HGB: 10.1 g/dL — ABNORMAL LOW (ref 11.6–15.9)
LYMPH%: 35.9 % (ref 14.0–49.7)
MCH: 34.1 pg — AB (ref 25.1–34.0)
MCHC: 32.8 g/dL (ref 31.5–36.0)
MCV: 104.1 fL — ABNORMAL HIGH (ref 79.5–101.0)
MONO#: 0.1 10*3/uL (ref 0.1–0.9)
MONO%: 7.1 % (ref 0.0–14.0)
NEUT#: 0.9 10*3/uL — ABNORMAL LOW (ref 1.5–6.5)
NEUT%: 54 % (ref 38.4–76.8)
Platelets: 105 10*3/uL — ABNORMAL LOW (ref 145–400)
RBC: 2.96 10*6/uL — AB (ref 3.70–5.45)
RDW: 21.2 % — ABNORMAL HIGH (ref 11.2–14.5)
WBC: 1.7 10*3/uL — ABNORMAL LOW (ref 3.9–10.3)
lymph#: 0.6 10*3/uL — ABNORMAL LOW (ref 0.9–3.3)
nRBC: 0 % (ref 0–0)

## 2017-08-15 MED ORDER — PALBOCICLIB 100 MG PO CAPS: 100.0000 mg | ORAL_CAPSULE | Freq: Every day | ORAL | 2 refills | Status: DC

## 2017-08-15 MED ORDER — INFLUENZA VAC SPLIT QUAD 0.5 ML IM SUSY
0.5000 mL | PREFILLED_SYRINGE | Freq: Once | INTRAMUSCULAR | Status: AC
  Administered 2017-08-15: 0.5 mL via INTRAMUSCULAR
  Filled 2017-08-15: qty 0.5

## 2017-08-15 MED FILL — IBRANCE 100 MG CAPSULE: 100 | 28 days supply | Qty: 21 | Fill #0

## 2017-08-15 NOTE — Telephone Encounter (Signed)
Gave avs and calendar for November  °

## 2017-08-15 NOTE — Progress Notes (Signed)
[place] [phone]   Fax:(336) [phone]  OFFICE PROGRESS NOTE  [name], [name] Lacks, MD [place] [address]  DIAGNOSIS: Metastatic breast adenocarcinoma presented with bone metastasis in the left hip area this was initially diagnosed as a stage II (T2, N1, M0) invasive right breast carcinoma with positive estrogen and progesterone receptor as well as HER-2/neu in July 2002  PRIOR THERAPY: 1. Status post right lumpectomy with right axillary lymph node dissection revealing 4/9 lymph nodes that were positive for malignancy.  2. Status post 8 cycles of systemic chemotherapy with CMF, completed March 2003.  3. Status post radiotherapy to the remaining right breast under the care of Dr. Tammi Klippel.  4. Status post 2 years of treatment with tamoxifen, started July 2003, then switched to Femara by Dr. Humphrey Rolls in August 2005. The patient discontinued the treatment in February 2009.  5. Status post left total hip replacement on Apr 01, 2011, secondary to pathologic left femoral neck fracture. 6. Zometa 4 mg IV on 3 monthly basis. Discontinued secondary to renal insufficiency. 7.  Arimidex 1 mg by mouth daily started 03/18/2011.   CURRENT THERAPY: 1. Arimidex 1 mg by mouth daily and Ibrance 125 mg by mouth daily for 21 days every 4 weeks. First dose started 04/25/2017. Leslee Home will be changed to 100 mg starting 08/22/2017. 2. Xgeva 120 g subcutaneously every 3 months.  INTERVAL HISTORY: [name] [name] 76 y.o. female returns to the clinic today for follow-up visit. The patient is feeling fine today was no specific complaints except for shortness breath with exertion. She continues to tolerate her treatment with Arimidex and Ibrance fairly well except for the chemotherapy-induced neutropenia. She was supposed to change her dose of Ibrance to 100 mg last month but she had a full bottle of 125 milligram at that time and she has only 2 tablets left. She  denied having any nausea, vomiting, diarrhea or constipation. She denied having any fever or chills. She has no chest pain, cough or hemoptysis. She is here today for evaluation and repeat blood work.   MEDICAL HISTORY: Past Medical History:  Diagnosis Date  . Anxiety    hx of  . Asthma   . Breast cancer ([place])    2002; metastatic in 2012, right breast, spread to left hip in 2012  . Cerebrovascular accident [facility])    R thalamic CVA 01/2010  . Dyslipidemia   . Encounter for therapeutic drug monitoring 04/14/2017  . Goals of care, counseling/discussion 04/19/2017  . Hyperlipemia   . Hypertension   . Low back pain   . Osteoarthritis   . TIA (transient ischemic attack)    x2 in 2011  . Vertigo 2014    ALLERGIES:  has No Known Allergies.  MEDICATIONS:  Current Outpatient Prescriptions  Medication Sig Dispense Refill  . ALPRAZolam (XANAX) 0.5 MG tablet Take 1 tablet (0.5 mg total) by mouth 2 (two) times daily as needed for anxiety or sleep. 60 tablet 2  . amLODipine (NORVASC) 10 MG tablet Take 0.5 tablets (5 mg total) by mouth daily. 90 tablet 1  . anastrozole (ARIMIDEX) 1 MG tablet Take 1 tablet (1 mg total) by mouth daily. 90 tablet 2  . Cholecalciferol (VITAMIN D3) 1000 UNITS CAPS Take 1 capsule by mouth daily.     Leslee Home 125 MG capsule TAKE 1 CAPSULE (125 MG TOTAL) BY MOUTH DAILY WITH BREAKFAST. TAKE WHOLE WITH FOOD. 21 capsule 2  . losartan-hydrochlorothiazide (HYZAAR)  100-25 MG tablet Take 1 tablet by mouth daily. 90 tablet 3  . meclizine (ANTIVERT) 12.5 MG tablet Take 1 tablet (12.5 mg total) by mouth 2 (two) times daily as needed for dizziness. 10 tablet 0  . spironolactone (ALDACTONE) 50 MG tablet Take 1 tablet (50 mg total) by mouth daily. 90 tablet 3  . tiZANidine (ZANAFLEX) 4 MG tablet Take 4 mg by mouth every 6 (six) hours as needed for muscle spasms.     No current facility-administered medications for this visit.    Facility-Administered Medications Ordered in Other  Visits  Medication Dose Route Frequency Provider Last Rate Last Dose  . alteplase (CATHFLO ACTIVASE) injection 2 mg  2 mg Intracatheter Once PRN Curt Bears, MD      . denosumab (XGEVA) injection 120 mg  120 mg Subcutaneous Once Curt Bears, MD        SURGICAL HISTORY:  Past Surgical History:  Procedure Laterality Date  . ABDOMINAL HYSTERECTOMY     complete  . BREAST LUMPECTOMY     Right breast 2002  . JOINT REPLACEMENT  2012   L hip due to breast ca met  . TOTAL HIP ARTHROPLASTY Right 10/28/2015   Procedure: RIGHT TOTAL HIP ARTHROPLASTY ANTERIOR APPROACH;  Surgeon: Paralee Cancel, MD;  Location: [facility];  Service: Orthopedics;  Laterality: Right;    REVIEW OF SYSTEMS:  A comprehensive review of systems was negative except for: Constitutional: positive for fatigue Respiratory: positive for dyspnea on exertion   PHYSICAL EXAMINATION: General appearance: alert, cooperative, fatigued and no distress Head: Normocephalic, without obvious abnormality, atraumatic Neck: no adenopathy, no JVD, supple, symmetrical, trachea midline and thyroid not enlarged, symmetric, no tenderness/mass/nodules Lymph nodes: Cervical, supraclavicular, and axillary nodes normal. Resp: clear to auscultation bilaterally Back: symmetric, no curvature. ROM normal. No CVA tenderness. Cardio: regular rate and rhythm, S1, S2 normal, no murmur, click, rub or gallop GI: soft, non-tender; bowel sounds normal; no masses,  no organomegaly Extremities: extremities normal, atraumatic, no cyanosis or edema  ECOG PERFORMANCE STATUS: 1 - Symptomatic but completely ambulatory  Blood pressure (!) 157/72, pulse 74, temperature 98 F (36.7 C), temperature source Oral, resp. rate 18, height 5' 3"  (1.6 m), weight 225 lb 4.8 oz (102.2 kg), SpO2 100 %.  LABORATORY DATA: Lab Results  Component Value Date   WBC 1.7 (L) 08/15/2017   HGB 10.1 (L) 08/15/2017   HCT 30.8 (L) 08/15/2017   MCV 104.1 (H) 08/15/2017   PLT 105 (L)  08/15/2017      Chemistry      Component Value Date/Time   NA 139 07/14/2017 0842   K 4.1 07/14/2017 0842   CL 105 03/02/2016 0834   CL 109 (H) 04/25/2013 1023   CO2 23 07/14/2017 0842   BUN 14.2 07/14/2017 0842   CREATININE 1.6 (H) 07/14/2017 0842      Component Value Date/Time   CALCIUM 9.3 07/14/2017 0842   ALKPHOS 73 07/14/2017 0842   AST 20 07/14/2017 0842   ALT 12 07/14/2017 0842   BILITOT 0.46 07/14/2017 0842       RADIOGRAPHIC STUDIES: No results found.  ASSESSMENT AND PLAN:  This is a very pleasant 76 years old African-American female with metastatic breast carcinoma status post treatment with Arimidex 1 mg by mouth daily since May 2012 Unfortunately the patient developed disease progression with new multiple pulmonary nodules in addition to questionable metastatic disease to the T10 vertebra. The patient was started on treatment with Arimidex 1 mg by mouth daily and  Ibrance 125 mg daily for 21 days every 4 weeks and has been tolerating this treatment well except for pancytopenia. I recommended for the patient to reduce the dose of Ibrance to 100 mg by mouth daily for 21 days every 4 weeks in addition to the daily dose of Arimidex. She will start the new dose of Ibrance next week. For the metastatic bone disease, the patient will continue her current treatment with Xgeva every 3 months. I will see her back for follow-up visit in 4 weeks for evaluation and repeat blood work. The patient was advised to call immediately if she has any concerning symptoms in the interval. The patient voices understanding of current disease status and treatment options and is in agreement with the current care plan. All questions were answered. The patient knows to call the clinic with any problems, questions or concerns. We can certainly see the patient much sooner if necessary.  Disclaimer: This note was dictated with voice recognition software. Similar sounding words can inadvertently be  transcribed and may not be corrected upon review.

## 2017-08-16 LAB — CANCER ANTIGEN 27.29: CAN 27.29: 112.9 U/mL — AB (ref 0.0–38.6)

## 2017-09-06 MED FILL — IBRANCE 100 MG CAPSULE: 100 | 28 days supply | Qty: 21 | Fill #1

## 2017-09-13 ENCOUNTER — Other Ambulatory Visit: Payer: Medicare Other

## 2017-09-13 ENCOUNTER — Ambulatory Visit: Payer: Medicare Other | Admitting: Internal Medicine

## 2017-09-15 ENCOUNTER — Telehealth: Payer: Self-pay | Admitting: *Deleted

## 2017-09-15 NOTE — Telephone Encounter (Signed)
"  Need to reschedule all of yesterday's appointments.  I was sick, could  not come in.  Still sick but want to do ahead and reschedule for next week or trhe following week."   Message leff for scheduler.

## 2017-09-16 ENCOUNTER — Telehealth: Payer: Self-pay | Admitting: Internal Medicine

## 2017-09-16 NOTE — Telephone Encounter (Signed)
Spoke with patient and rescheduled appt per 11/15 sch msg.  °

## 2017-09-27 ENCOUNTER — Encounter: Payer: Self-pay | Admitting: Internal Medicine

## 2017-09-27 ENCOUNTER — Ambulatory Visit (HOSPITAL_BASED_OUTPATIENT_CLINIC_OR_DEPARTMENT_OTHER): Payer: Medicare Other

## 2017-09-27 ENCOUNTER — Ambulatory Visit (HOSPITAL_BASED_OUTPATIENT_CLINIC_OR_DEPARTMENT_OTHER): Payer: Medicare Other | Admitting: Internal Medicine

## 2017-09-27 ENCOUNTER — Other Ambulatory Visit (HOSPITAL_BASED_OUTPATIENT_CLINIC_OR_DEPARTMENT_OTHER): Payer: Medicare Other

## 2017-09-27 DIAGNOSIS — R911 Solitary pulmonary nodule: Secondary | ICD-10-CM | POA: Diagnosis not present

## 2017-09-27 DIAGNOSIS — R5383 Other fatigue: Secondary | ICD-10-CM | POA: Diagnosis not present

## 2017-09-27 DIAGNOSIS — C50911 Malignant neoplasm of unspecified site of right female breast: Secondary | ICD-10-CM

## 2017-09-27 DIAGNOSIS — Z95828 Presence of other vascular implants and grafts: Secondary | ICD-10-CM

## 2017-09-27 DIAGNOSIS — C7951 Secondary malignant neoplasm of bone: Secondary | ICD-10-CM

## 2017-09-27 DIAGNOSIS — M25511 Pain in right shoulder: Secondary | ICD-10-CM

## 2017-09-27 LAB — CBC WITH DIFFERENTIAL/PLATELET
BASO%: 2.2 % — AB (ref 0.0–2.0)
BASOS ABS: 0 10*3/uL (ref 0.0–0.1)
EOS%: 0.3 % (ref 0.0–7.0)
Eosinophils Absolute: 0 10*3/uL (ref 0.0–0.5)
HEMATOCRIT: 34.1 % — AB (ref 34.8–46.6)
HGB: 11.4 g/dL — ABNORMAL LOW (ref 11.6–15.9)
LYMPH#: 0.5 10*3/uL — AB (ref 0.9–3.3)
LYMPH%: 21.2 % (ref 14.0–49.7)
MCH: 37.2 pg — AB (ref 25.1–34.0)
MCHC: 33.4 g/dL (ref 31.5–36.0)
MCV: 111.2 fL — ABNORMAL HIGH (ref 79.5–101.0)
MONO#: 0.2 10*3/uL (ref 0.1–0.9)
MONO%: 11.2 % (ref 0.0–14.0)
NEUT#: 1.4 10*3/uL — ABNORMAL LOW (ref 1.5–6.5)
NEUT%: 65.1 % (ref 38.4–76.8)
Platelets: 198 10*3/uL (ref 145–400)
RBC: 3.07 10*6/uL — AB (ref 3.70–5.45)
RDW: 18.6 % — ABNORMAL HIGH (ref 11.2–14.5)
WBC: 2.2 10*3/uL — ABNORMAL LOW (ref 3.9–10.3)

## 2017-09-27 LAB — COMPREHENSIVE METABOLIC PANEL
ALT: 16 U/L (ref 0–55)
ANION GAP: 13 meq/L — AB (ref 3–11)
AST: 32 U/L (ref 5–34)
Albumin: 3.6 g/dL (ref 3.5–5.0)
Alkaline Phosphatase: 78 U/L (ref 40–150)
BILIRUBIN TOTAL: 0.73 mg/dL (ref 0.20–1.20)
BUN: 11.1 mg/dL (ref 7.0–26.0)
CALCIUM: 9.8 mg/dL (ref 8.4–10.4)
CHLORIDE: 106 meq/L (ref 98–109)
CO2: 23 mEq/L (ref 22–29)
CREATININE: 1.7 mg/dL — AB (ref 0.6–1.1)
EGFR: 33 mL/min/{1.73_m2} — ABNORMAL LOW (ref >60)
Glucose: 116 mg/dl (ref 70–140)
Potassium: 3.8 mEq/L (ref 3.5–5.1)
Sodium: 142 mEq/L (ref 136–145)
TOTAL PROTEIN: 8.3 g/dL (ref 6.4–8.3)

## 2017-09-27 MED ORDER — DENOSUMAB 120 MG/1.7ML ~~LOC~~ SOLN
120.0000 mg | Freq: Once | SUBCUTANEOUS | Status: AC
  Administered 2017-09-27: 120 mg via SUBCUTANEOUS
  Filled 2017-09-27: qty 1.7

## 2017-09-27 MED ORDER — ANASTROZOLE 1 MG PO TABS: 1.0000 mg | ORAL_TABLET | Freq: Every day | ORAL | 2 refills | Status: DC

## 2017-09-27 NOTE — Patient Instructions (Signed)
Denosumab injection What is this medicine? DENOSUMAB (den oh sue mab) slows bone breakdown. Prolia is used to treat osteoporosis in women after menopause and in men. Xgeva is used to prevent bone fractures and other bone problems caused by cancer bone metastases. Xgeva is also used to treat giant cell tumor of the bone. This medicine may be used for other purposes; ask your health care provider or pharmacist if you have questions. COMMON BRAND NAME(S): Prolia, XGEVA What should I tell my health care provider before I take this medicine? They need to know if you have any of these conditions: -dental disease -eczema -infection or history of infections -kidney disease or on dialysis -low blood calcium or vitamin D -malabsorption syndrome -scheduled to have surgery or tooth extraction -taking medicine that contains denosumab -thyroid or parathyroid disease -an unusual reaction to denosumab, other medicines, foods, dyes, or preservatives -pregnant or trying to get pregnant -breast-feeding How should I use this medicine? This medicine is for injection under the skin. It is given by a health care professional in a hospital or clinic setting. If you are getting Prolia, a special MedGuide will be given to you by the pharmacist with each prescription and refill. Be sure to read this information carefully each time. For Prolia, talk to your pediatrician regarding the use of this medicine in children. Special care may be needed. For Xgeva, talk to your pediatrician regarding the use of this medicine in children. While this drug may be prescribed for children as young as 13 years for selected conditions, precautions do apply. Overdosage: If you think you've taken too much of this medicine contact a poison control center or emergency room at once. Overdosage: If you think you have taken too much of this medicine contact a poison control center or emergency room at once. NOTE: This medicine is only for  you. Do not share this medicine with others. What if I miss a dose? It is important not to miss your dose. Call your doctor or health care professional if you are unable to keep an appointment. What may interact with this medicine? Do not take this medicine with any of the following medications: -other medicines containing denosumab This medicine may also interact with the following medications: -medicines that suppress the immune system -medicines that treat cancer -steroid medicines like prednisone or cortisone This list may not describe all possible interactions. Give your health care provider a list of all the medicines, herbs, non-prescription drugs, or dietary supplements you use. Also tell them if you smoke, drink alcohol, or use illegal drugs. Some items may interact with your medicine. What should I watch for while using this medicine? Visit your doctor or health care professional for regular checks on your progress. Your doctor or health care professional may order blood tests and other tests to see how you are doing. Call your doctor or health care professional if you get a cold or other infection while receiving this medicine. Do not treat yourself. This medicine may decrease your body's ability to fight infection. You should make sure you get enough calcium and vitamin D while you are taking this medicine, unless your doctor tells you not to. Discuss the foods you eat and the vitamins you take with your health care professional. See your dentist regularly. Brush and floss your teeth as directed. Before you have any dental work done, tell your dentist you are receiving this medicine. Do not become pregnant while taking this medicine or for 5 months after stopping   it. Women should inform their doctor if they wish to become pregnant or think they might be pregnant. There is a potential for serious side effects to an unborn child. Talk to your health care professional or pharmacist for more  information. What side effects may I notice from receiving this medicine? Side effects that you should report to your doctor or health care professional as soon as possible: -allergic reactions like skin rash, itching or hives, swelling of the face, lips, or tongue -breathing problems -chest pain -fast, irregular heartbeat -feeling faint or lightheaded, falls -fever, chills, or any other sign of infection -muscle spasms, tightening, or twitches -numbness or tingling -skin blisters or bumps, or is dry, peels, or red -slow healing or unexplained pain in the mouth or jaw -unusual bleeding or bruising Side effects that usually do not require medical attention (Report these to your doctor or health care professional if they continue or are bothersome.): -muscle pain -stomach upset, gas This list may not describe all possible side effects. Call your doctor for medical advice about side effects. You may report side effects to FDA at 1-800-FDA-1088. Where should I keep my medicine? This medicine is only given in a clinic, doctor's office, or other health care setting and will not be stored at home. NOTE: This sheet is a summary. It may not cover all possible information. If you have questions about this medicine, talk to your doctor, pharmacist, or health care provider.  2015, [employer] Standard. (2012-04-17 12:37:47)  

## 2017-09-27 NOTE — Progress Notes (Signed)
[place] Telephone:([phone]   Fax:(336) [phone]  OFFICE PROGRESS NOTE  [name], [name], MD [place] [address]  DIAGNOSIS: Metastatic breast adenocarcinoma presented with bone metastasis in the left hip area this was initially diagnosed as a stage II (T2, N1, M0) invasive right breast carcinoma with positive estrogen and progesterone receptor as well as HER-2/neu in July 2002  PRIOR THERAPY: 1. Status post right lumpectomy with right axillary lymph node dissection revealing 4/9 lymph nodes that were positive for malignancy.  2. Status post 8 cycles of systemic chemotherapy with CMF, completed March 2003.  3. Status post radiotherapy to the remaining right breast under the care of Dr. Tammi Klippel.  4. Status post 2 years of treatment with tamoxifen, started July 2003, then switched to Femara by Dr. Humphrey Rolls in August 2005. The patient discontinued the treatment in February 2009.  5. Status post left total hip replacement on Apr 01, 2011, secondary to pathologic left femoral neck fracture. 6. Zometa 4 mg IV on 3 monthly basis. Discontinued secondary to renal insufficiency. 7.  Arimidex 1 mg by mouth daily started 03/18/2011.   CURRENT THERAPY: 1. Arimidex 1 mg by mouth daily and Ibrance 125 mg by mouth daily for 21 days every 4 weeks. First dose started 04/25/2017. Leslee Home will be changed to 100 mg starting 08/22/2017. 2. Xgeva 120 g subcutaneously every 3 months.  INTERVAL HISTORY: [name] [name] 76 y.o. female returns to the clinic today for follow-up visit.  The patient is feeling fine today with no specific complaints except for fatigue.  She denied having any recent weight loss or night sweats.  She has no nausea, vomiting, diarrhea or constipation.  She continues to have arthralgia especially in the right shoulder.  The patient has no chest pain, shortness breath, cough or hemoptysis.  She denied having any fever or chills.  She continues to  tolerate her treatment with Arimidex and Ibrance fairly well.  She is here today for evaluation and repeat full blood work as well as treatment with Delton See.  MEDICAL HISTORY: Past Medical History:  Diagnosis Date  . Anxiety    hx of  . Asthma   . Breast cancer ([place])    2002; metastatic in 2012, right breast, spread to left hip in 2012  . Cerebrovascular accident [facility])    R thalamic CVA 01/2010  . Chemotherapy induced neutropenia ([place]) 08/15/2017  . Dyslipidemia   . Encounter for therapeutic drug monitoring 04/14/2017  . Goals of care, counseling/discussion 04/19/2017  . Hyperlipemia   . Hypertension   . Low back pain   . Osteoarthritis   . TIA (transient ischemic attack)    x2 in 2011  . Vertigo 2014    ALLERGIES:  has No Known Allergies.  MEDICATIONS:  Current Outpatient Medications  Medication Sig Dispense Refill  . ALPRAZolam (XANAX) 0.5 MG tablet Take 1 tablet (0.5 mg total) by mouth 2 (two) times daily as needed for anxiety or sleep. 60 tablet 2  . amLODipine (NORVASC) 10 MG tablet Take 0.5 tablets (5 mg total) by mouth daily. 90 tablet 1  . anastrozole (ARIMIDEX) 1 MG tablet Take 1 tablet (1 mg total) by mouth daily. 90 tablet 2  . Cholecalciferol (VITAMIN D3) 1000 UNITS CAPS Take 1 capsule by mouth daily.     Marland Kitchen losartan-hydrochlorothiazide (HYZAAR) 100-25 MG tablet Take 1 tablet by mouth daily. 90 tablet 3  . meclizine (ANTIVERT) 12.5 MG tablet Take 1 tablet (  12.5 mg total) by mouth 2 (two) times daily as needed for dizziness. 10 tablet 0  . palbociclib (IBRANCE) 100 MG capsule Take 1 capsule (100 mg total) by mouth daily with breakfast. Take whole with food. 21 capsule 2  . spironolactone (ALDACTONE) 50 MG tablet Take 1 tablet (50 mg total) by mouth daily. 90 tablet 3  . tiZANidine (ZANAFLEX) 4 MG tablet Take 4 mg by mouth every 6 (six) hours as needed for muscle spasms.     No current facility-administered medications for this visit.    Facility-Administered  Medications Ordered in Other Visits  Medication Dose Route Frequency Provider Last Rate Last Dose  . alteplase (CATHFLO ACTIVASE) injection 2 mg  2 mg Intracatheter Once PRN Curt Bears, MD      . denosumab (XGEVA) injection 120 mg  120 mg Subcutaneous Once Curt Bears, MD        SURGICAL HISTORY:  Past Surgical History:  Procedure Laterality Date  . ABDOMINAL HYSTERECTOMY     complete  . BREAST LUMPECTOMY     Right breast 2002  . JOINT REPLACEMENT  2012   L hip due to breast ca met  . TOTAL HIP ARTHROPLASTY Right 10/28/2015   Procedure: RIGHT TOTAL HIP ARTHROPLASTY ANTERIOR APPROACH;  Surgeon: Paralee Cancel, MD;  Location: [facility];  Service: Orthopedics;  Laterality: Right;    REVIEW OF SYSTEMS:  A comprehensive review of systems was negative except for: Constitutional: positive for fatigue Musculoskeletal: positive for arthralgias   PHYSICAL EXAMINATION: General appearance: alert, cooperative, fatigued and no distress Head: Normocephalic, without obvious abnormality, atraumatic Neck: no adenopathy, no JVD, supple, symmetrical, trachea midline and thyroid not enlarged, symmetric, no tenderness/mass/nodules Lymph nodes: Cervical, supraclavicular, and axillary nodes normal. Resp: clear to auscultation bilaterally Back: symmetric, no curvature. ROM normal. No CVA tenderness. Cardio: regular rate and rhythm, S1, S2 normal, no murmur, click, rub or gallop GI: soft, non-tender; bowel sounds normal; no masses,  no organomegaly Extremities: extremities normal, atraumatic, no cyanosis or edema  ECOG PERFORMANCE STATUS: 1 - Symptomatic but completely ambulatory  Blood pressure (!) 149/78, pulse 100, temperature 97.6 F (36.4 C), temperature source Oral, resp. rate 17, height 5' 3" (1.6 m), weight 217 lb 14.4 oz (98.8 kg), SpO2 97 %.  LABORATORY DATA: Lab Results  Component Value Date   WBC 2.2 (L) 09/27/2017   HGB 11.4 (L) 09/27/2017   HCT 34.1 (L) 09/27/2017   MCV 111.2 (H)  09/27/2017   PLT 198 09/27/2017      Chemistry      Component Value Date/Time   NA 141 08/15/2017 1000   K 4.4 08/15/2017 1000   CL 105 03/02/2016 0834   CL 109 (H) 04/25/2013 1023   CO2 23 08/15/2017 1000   BUN 15.1 08/15/2017 1000   CREATININE 1.4 (H) 08/15/2017 1000      Component Value Date/Time   CALCIUM 9.4 08/15/2017 1000   ALKPHOS 64 08/15/2017 1000   AST 28 08/15/2017 1000   ALT 18 08/15/2017 1000   BILITOT 0.76 08/15/2017 1000       RADIOGRAPHIC STUDIES: No results found.  ASSESSMENT AND PLAN:  This is a very pleasant 76 years old African-American female with metastatic breast carcinoma status post treatment with Arimidex 1 mg by mouth daily since May 2012 Unfortunately the patient developed disease progression with new multiple pulmonary nodules in addition to questionable metastatic disease to the T10 vertebra. The patient was started on treatment with Arimidex 1 mg by mouth daily and  Ibrance 125 mg daily for 21 days every 4 weeks and has been tolerating this treatment well except for pancytopenia.  Her dose of Ibrance was a change it to 100 mg p.o. daily for 21 days every 4 weeks in addition to Arimidex. She continues to tolerate this treatment much better and there is improvement in her total white blood count and absolute neutrophil count. I recommended for the patient to stay on this treatment. She will also receive her treatment with Delton See today for the metastatic bone disease. I would see the patient back for follow-up visit in 1 month for evaluation and repeat blood work. She was advised to call immediately if she has any concerning symptoms in the interval. The patient voices understanding of current disease status and treatment options and is in agreement with the current care plan. All questions were answered. The patient knows to call the clinic with any problems, questions or concerns. We can certainly see the patient much sooner if  necessary.  Disclaimer: This note was dictated with voice recognition software. Similar sounding words can inadvertently be transcribed and may not be corrected upon review.

## 2017-09-28 ENCOUNTER — Other Ambulatory Visit: Payer: Self-pay | Admitting: Internal Medicine

## 2017-09-28 DIAGNOSIS — C50919 Malignant neoplasm of unspecified site of unspecified female breast: Secondary | ICD-10-CM

## 2017-09-28 DIAGNOSIS — C7951 Secondary malignant neoplasm of bone: Principal | ICD-10-CM

## 2017-09-28 LAB — CANCER ANTIGEN 27.29: CA 27.29: 165.6 U/mL — ABNORMAL HIGH (ref 0.0–38.6)

## 2017-09-29 MED FILL — ANASTROZOLE 1 MG TABLET: 1 | 90 days supply | Qty: 90 | Fill #0

## 2017-09-29 NOTE — Telephone Encounter (Signed)
Oral Oncology Patient Advocate Encounter  Received notification from [place] that the patient's copay assistance grant was out of funds.    I contacted [employer]'s Patient Assistance program ti inquire about the application that had been placed on hold in July.  They were able to reactivate and process the application over the phone.  The patient has been successfully enrolled into their program to receive Ibrance from the manufacturer at $0 out of pocket until 10/31/2017.  If no grant foundations are available at the first of the year, the patient will be automatically enrolled into the program until 10/31/2018.     Patient is aware and knows to call the office with any additional concerns or questions.   Oral Oncology Clinic will continue to follow.  Gilmore Laroche, CPhT, [place] Oral Oncology Patient Advocate [phone] 09/29/2017 10:34 AM

## 2017-10-11 ENCOUNTER — Telehealth: Payer: Self-pay | Admitting: *Deleted

## 2017-10-11 NOTE — Telephone Encounter (Signed)
Call from Pharmacy, pt is not due for Xgeva on 12/13 . Injection r/s for 12/26 per pt request as she is coming to [facility] for CT scan same day.  Advised pt to expect a call from scheduling to confirm 12/26 injection appt. Message to scheduling.

## 2017-10-12 ENCOUNTER — Telehealth: Payer: Self-pay | Admitting: Internal Medicine

## 2017-10-12 NOTE — Telephone Encounter (Signed)
Spoke with patient regarding appointment D/T and schedule mailed to patient.Marland KitchenMarland KitchenMarland KitchenMarland Kitchenper orders sched msg 12/11

## 2017-10-13 ENCOUNTER — Ambulatory Visit: Payer: Medicare Other

## 2017-10-18 ENCOUNTER — Encounter: Payer: Self-pay | Admitting: *Deleted

## 2017-10-26 ENCOUNTER — Ambulatory Visit (HOSPITAL_COMMUNITY)
Admission: RE | Admit: 2017-10-26 | Discharge: 2017-10-26 | Disposition: A | Discharge status: Home / Self Care | Payer: Medicare Other | Source: Ambulatory Visit | Attending: Internal Medicine | Admitting: Internal Medicine

## 2017-10-26 ENCOUNTER — Ambulatory Visit: Payer: Medicare Other

## 2017-10-26 ENCOUNTER — Encounter (HOSPITAL_COMMUNITY): Payer: Self-pay

## 2017-10-26 DIAGNOSIS — I2510 Atherosclerotic heart disease of native coronary artery without angina pectoris: Secondary | ICD-10-CM | POA: Insufficient documentation

## 2017-10-26 DIAGNOSIS — C7951 Secondary malignant neoplasm of bone: Secondary | ICD-10-CM | POA: Diagnosis present

## 2017-10-26 DIAGNOSIS — I517 Cardiomegaly: Secondary | ICD-10-CM | POA: Insufficient documentation

## 2017-10-26 DIAGNOSIS — R918 Other nonspecific abnormal finding of lung field: Secondary | ICD-10-CM | POA: Insufficient documentation

## 2017-10-26 DIAGNOSIS — I700 Atherosclerosis of aorta: Secondary | ICD-10-CM | POA: Insufficient documentation

## 2017-10-26 DIAGNOSIS — C50919 Malignant neoplasm of unspecified site of unspecified female breast: Secondary | ICD-10-CM | POA: Diagnosis not present

## 2017-10-26 MED ORDER — IOPAMIDOL (ISOVUE-300) INJECTION 61%
INTRAVENOUS | Status: AC
  Filled 2017-10-26: qty 30

## 2017-10-26 MED ORDER — HEPARIN SOD (PORK) LOCK FLUSH 100 UNIT/ML IV SOLN
INTRAVENOUS | Status: AC
  Administered 2017-10-26: 500 [IU] via INTRAVENOUS
  Filled 2017-10-26: qty 5

## 2017-10-26 MED ORDER — IOPAMIDOL (ISOVUE-300) INJECTION 61%
100.0000 mL | Freq: Once | INTRAVENOUS | Status: AC | PRN
  Administered 2017-10-26: 80 mL via INTRAVENOUS

## 2017-10-26 MED ORDER — IOPAMIDOL (ISOVUE-300) INJECTION 61%
30.0000 mL | Freq: Once | INTRAVENOUS | Status: AC | PRN
  Administered 2017-10-26: 15 mL via ORAL

## 2017-10-26 MED ORDER — HEPARIN SOD (PORK) LOCK FLUSH 100 UNIT/ML IV SOLN
500.0000 [IU] | Freq: Once | INTRAVENOUS | Status: AC
  Administered 2017-10-26: 500 [IU] via INTRAVENOUS

## 2017-10-26 MED ORDER — IOPAMIDOL (ISOVUE-300) INJECTION 61%
INTRAVENOUS | Status: AC
  Administered 2017-10-26: 80 mL via INTRAVENOUS
  Filled 2017-10-26: qty 100

## 2017-11-03 ENCOUNTER — Telehealth: Payer: Self-pay | Admitting: Oncology

## 2017-11-03 ENCOUNTER — Other Ambulatory Visit: Payer: Self-pay | Admitting: Pharmacist

## 2017-11-03 ENCOUNTER — Telehealth: Payer: Self-pay | Admitting: Pharmacy Technician

## 2017-11-03 ENCOUNTER — Other Ambulatory Visit (HOSPITAL_BASED_OUTPATIENT_CLINIC_OR_DEPARTMENT_OTHER): Payer: Medicare HMO

## 2017-11-03 ENCOUNTER — Inpatient Hospital Stay: Payer: Medicare Other | Attending: Oncology | Admitting: Oncology

## 2017-11-03 ENCOUNTER — Encounter: Payer: Self-pay | Admitting: Oncology

## 2017-11-03 VITALS — BP 138/74 | HR 82 | Temp 98.0°F | Resp 16 | Ht 63.0 in | Wt 212.0 lb

## 2017-11-03 DIAGNOSIS — C50911 Malignant neoplasm of unspecified site of right female breast: Secondary | ICD-10-CM

## 2017-11-03 DIAGNOSIS — C7951 Secondary malignant neoplasm of bone: Secondary | ICD-10-CM

## 2017-11-03 DIAGNOSIS — R5383 Other fatigue: Secondary | ICD-10-CM | POA: Diagnosis not present

## 2017-11-03 DIAGNOSIS — D61810 Antineoplastic chemotherapy induced pancytopenia: Secondary | ICD-10-CM | POA: Diagnosis not present

## 2017-11-03 DIAGNOSIS — C50919 Malignant neoplasm of unspecified site of unspecified female breast: Secondary | ICD-10-CM

## 2017-11-03 DIAGNOSIS — Z5181 Encounter for therapeutic drug level monitoring: Secondary | ICD-10-CM

## 2017-11-03 LAB — CBC WITH DIFFERENTIAL/PLATELET
BASO%: 2 % (ref 0.0–2.0)
Basophils Absolute: 0 10*3/uL (ref 0.0–0.1)
EOS%: 0.5 % (ref 0.0–7.0)
Eosinophils Absolute: 0 10*3/uL (ref 0.0–0.5)
HEMATOCRIT: 33.2 % — AB (ref 34.8–46.6)
HEMOGLOBIN: 11 g/dL — AB (ref 11.6–15.9)
LYMPH#: 0.4 10*3/uL — AB (ref 0.9–3.3)
LYMPH%: 22.3 % (ref 14.0–49.7)
MCH: 37.3 pg — ABNORMAL HIGH (ref 25.1–34.0)
MCHC: 33.1 g/dL (ref 31.5–36.0)
MCV: 112.5 fL — ABNORMAL HIGH (ref 79.5–101.0)
MONO#: 0.4 10*3/uL (ref 0.1–0.9)
MONO%: 21.3 % — ABNORMAL HIGH (ref 0.0–14.0)
NEUT%: 53.9 % (ref 38.4–76.8)
NEUTROS ABS: 1.1 10*3/uL — AB (ref 1.5–6.5)
PLATELETS: 111 10*3/uL — AB (ref 145–400)
RBC: 2.95 10*6/uL — ABNORMAL LOW (ref 3.70–5.45)
RDW: 15.1 % — AB (ref 11.2–14.5)
WBC: 2 10*3/uL — ABNORMAL LOW (ref 3.9–10.3)

## 2017-11-03 LAB — COMPREHENSIVE METABOLIC PANEL
ALBUMIN: 3.6 g/dL (ref 3.5–5.0)
ALK PHOS: 74 U/L (ref 40–150)
ALT: 16 U/L (ref 0–55)
AST: 27 U/L (ref 5–34)
Anion Gap: 12 mEq/L — ABNORMAL HIGH (ref 3–11)
BUN: 9.7 mg/dL (ref 7.0–26.0)
CO2: 23 mEq/L (ref 22–29)
Calcium: 9.2 mg/dL (ref 8.4–10.4)
Chloride: 105 mEq/L (ref 98–109)
Creatinine: 1.4 mg/dL — ABNORMAL HIGH (ref 0.6–1.1)
EGFR: 44 mL/min/{1.73_m2} — AB (ref >60)
GLUCOSE: 103 mg/dL (ref 70–140)
Potassium: 3.5 mEq/L (ref 3.5–5.1)
SODIUM: 140 meq/L (ref 136–145)
Total Bilirubin: 0.75 mg/dL (ref 0.20–1.20)
Total Protein: 8.2 g/dL (ref 6.4–8.3)

## 2017-11-03 MED ORDER — PALBOCICLIB 100 MG PO CAPS: 100.0000 mg | ORAL_CAPSULE | Freq: Every day | ORAL | 2 refills | Status: DC

## 2017-11-03 MED FILL — IBRANCE 100 MG CAPSULE: 100 | 28 days supply | Qty: 21 | Fill #2

## 2017-11-03 NOTE — Progress Notes (Signed)
[place] OFFICE PROGRESS NOTE  [name], [name], MD [place] [id number]  DIAGNOSIS: Metastatic breast adenocarcinoma presented with bone metastasis in the left hip area this was initially diagnosed as a stage II (T2, N1, M0) invasive right breast carcinoma with positive estrogen and progesterone receptor as well as HER-2/neu in July 2002  PRIOR THERAPY: 1. Status post right lumpectomy with right axillary lymph node dissection revealing 4/9 lymph nodes that were positive for malignancy.  2. Status post 8 cycles of systemic chemotherapy with CMF, completed March 2003.  3. Status post radiotherapy to the remaining right breast under the care of Dr. Tammi Klippel.  4. Status post 2 years of treatment with tamoxifen, started July 2003, then switched to Femara by Dr. Humphrey Rolls in August 2005. The patient discontinued the treatment in February 2009.  5. Status post left total hip replacement on Apr 01, 2011, secondary to pathologic left femoral neck fracture. 6. Zometa 4 mg IV on 3 monthly basis. Discontinued secondary to renal insufficiency. 7.  Arimidex 1 mg by mouth daily started 03/18/2011.  CURRENT THERAPY: 1. Arimidex 1 mg by mouth daily and Ibrance 125 mg by mouth daily for 21 days every 4 weeks. First dose started 04/25/2017. Leslee Home will be changed to 100 mg starting 08/22/2017. 2. Xgeva 120 g subcutaneously every 3 months.  INTERVAL HISTORY: [name] [name] 77 y.o. female returns for routine follow-up visit by herself.  The patient is feeling fine today has no specific complaints except for fatigue.  She denies having any recent weight loss or night sweats.  Denies chest pain, shortness breath, cough, hemoptysis.  Denies nausea, vomiting, constipation, diarrhea.  She continues to tolerate treatment with Arimidex and Ibrance fairly well.  The patient is due to begin her next cycle of Ibrance today.  The patient states that she received a bottle of 125 mg tablets  last month and took this instead of 100 mg tablets.  The patient is here today for evaluation and repeat blood work.  MEDICAL HISTORY: Past Medical History:  Diagnosis Date  . Anxiety    hx of  . Asthma   . Breast cancer ([place])    2002; metastatic in 2012, right breast, spread to left hip in 2012  . Cerebrovascular accident [facility])    R thalamic CVA 01/2010  . Chemotherapy induced neutropenia ([facility]) 08/15/2017  . Dyslipidemia   . Encounter for therapeutic drug monitoring 04/14/2017  . Goals of care, counseling/discussion 04/19/2017  . Hyperlipemia   . Hypertension   . Low back pain   . Osteoarthritis   . TIA (transient ischemic attack)    x2 in 2011  . Vertigo 2014    ALLERGIES:  has No Known Allergies.  MEDICATIONS:  Current Outpatient Medications  Medication Sig Dispense Refill  . ALPRAZolam (XANAX) 0.5 MG tablet Take 1 tablet (0.5 mg total) by mouth 2 (two) times daily as needed for anxiety or sleep. 60 tablet 2  . amLODipine (NORVASC) 10 MG tablet Take 0.5 tablets (5 mg total) by mouth daily. 90 tablet 1  . anastrozole (ARIMIDEX) 1 MG tablet Take 1 tablet (1 mg total) by mouth daily. 90 tablet 2  . Cholecalciferol (VITAMIN D3) 1000 UNITS CAPS Take 1 capsule by mouth daily.     Marland Kitchen losartan-hydrochlorothiazide (HYZAAR) 100-25 MG tablet Take 1 tablet by mouth daily. 90 tablet 3  . meclizine (ANTIVERT) 12.5 MG tablet Take 1 tablet (12.5 mg total) by mouth 2 (two) times daily as  needed for dizziness. 10 tablet 0  . palbociclib (IBRANCE) 100 MG capsule Take 1 capsule (100 mg total) by mouth daily with breakfast. Take whole with food. 21 capsule 2  . spironolactone (ALDACTONE) 50 MG tablet Take 1 tablet (50 mg total) by mouth daily. 90 tablet 3  . tiZANidine (ZANAFLEX) 4 MG tablet Take 4 mg by mouth every 6 (six) hours as needed for muscle spasms.     No current facility-administered medications for this visit.    Facility-Administered Medications Ordered in Other Visits  Medication  Dose Route Frequency Provider Last Rate Last Dose  . alteplase (CATHFLO ACTIVASE) injection 2 mg  2 mg Intracatheter Once PRN Curt Bears, MD      . denosumab (XGEVA) injection 120 mg  120 mg Subcutaneous Once Curt Bears, MD        SURGICAL HISTORY:  Past Surgical History:  Procedure Laterality Date  . ABDOMINAL HYSTERECTOMY     complete  . BREAST LUMPECTOMY     Right breast 2002  . JOINT REPLACEMENT  2012   L hip due to breast ca met  . TOTAL HIP ARTHROPLASTY Right 10/28/2015   Procedure: RIGHT TOTAL HIP ARTHROPLASTY ANTERIOR APPROACH;  Surgeon: Paralee Cancel, MD;  Location: [facility];  Service: Orthopedics;  Laterality: Right;    REVIEW OF SYSTEMS:   Review of Systems  Constitutional: Negative for appetite change, chills, fever and unexpected weight change. Positive for fatigue. HENT:   Negative for mouth sores, nosebleeds, sore throat and trouble swallowing.   Eyes: Negative for eye problems and icterus.  Respiratory: Negative for cough, hemoptysis, shortness of breath and wheezing.   Cardiovascular: Negative for chest pain and leg swelling.  Gastrointestinal: Negative for abdominal pain, constipation, diarrhea, nausea and vomiting.  Genitourinary: Negative for bladder incontinence, difficulty urinating, dysuria, frequency and hematuria.   Musculoskeletal: Negative for back pain, gait problem, neck pain and neck stiffness.  Skin: Negative for itching and rash.  Neurological: Negative for dizziness, extremity weakness, gait problem, headaches, light-headedness and seizures.  Hematological: Negative for adenopathy. Does not bruise/bleed easily.  Psychiatric/Behavioral: Negative for confusion, depression and sleep disturbance. The patient is not nervous/anxious.     PHYSICAL EXAMINATION:  Blood pressure 138/74, pulse 82, temperature 98 F (36.7 C), temperature source Oral, resp. rate 16, height _0  (1.6 m), weight 212 lb (96.2 kg), SpO2 100 %.  ECOG PERFORMANCE STATUS:  1 - Symptomatic but completely ambulatory  Physical Exam  Constitutional: Oriented to person, place, and time and well-developed, well-nourished, and in no distress. No distress.  HENT:  Head: Normocephalic and atraumatic.  Mouth/Throat: Oropharynx is clear and moist. No oropharyngeal exudate.  Eyes: Conjunctivae are normal. Right eye exhibits no discharge. Left eye exhibits no discharge. No scleral icterus.  Neck: Normal range of motion. Neck supple.  Cardiovascular: Normal rate, regular rhythm, normal heart sounds and intact distal pulses.   Pulmonary/Chest: Effort normal and breath sounds normal. No respiratory distress. No wheezes. No rales.  Abdominal: Soft. Bowel sounds are normal. Exhibits no distension and no mass. There is no tenderness.  Musculoskeletal: Normal range of motion. Exhibits no edema.  Lymphadenopathy:    No cervical adenopathy.  Neurological: Alert and oriented to person, place, and time. Exhibits normal muscle tone. Gait normal. Coordination normal.  Skin: Skin is warm and dry. No rash noted. Not diaphoretic. No erythema. No pallor.  Psychiatric: Mood, memory and judgment normal.  Vitals reviewed.  LABORATORY DATA: Lab Results  Component Value Date   WBC 2.0 (  L) 11/03/2017   HGB 11.0 (L) 11/03/2017   HCT 33.2 (L) 11/03/2017   MCV 112.5 (H) 11/03/2017   PLT 111 (L) 11/03/2017      Chemistry      Component Value Date/Time   NA 140 11/03/2017 1044   K 3.5 11/03/2017 1044   CL 105 03/02/2016 0834   CL 109 (H) 04/25/2013 1023   CO2 23 11/03/2017 1044   BUN 9.7 11/03/2017 1044   CREATININE 1.4 (H) 11/03/2017 1044      Component Value Date/Time   CALCIUM 9.2 11/03/2017 1044   ALKPHOS 74 11/03/2017 1044   AST 27 11/03/2017 1044   ALT 16 11/03/2017 1044   BILITOT 0.75 11/03/2017 1044       RADIOGRAPHIC STUDIES:  Ct Chest W Contrast  Result Date: 10/26/2017 CLINICAL DATA:  77 year old female with history of right-sided breast cancer originally  diagnosed in 1995 treated with right-sided lumpectomy, followed by chemotherapy and radiation therapy. Bone metastases diagnosed in 2012 treated with radiation therapy. Ongoing oral chemotherapy. Shortness of breath on exertion. EXAM: CT CHEST, ABDOMEN, AND PELVIS WITH CONTRAST TECHNIQUE: Multidetector CT imaging of the chest, abdomen and pelvis was performed following the standard protocol during bolus administration of intravenous contrast. CONTRAST:  68m ISOVUE-300 IOPAMIDOL (ISOVUE-300) INJECTION 61% COMPARISON:  CT the chest, abdomen and pelvis 07/11/2017. FINDINGS: CT CHEST FINDINGS Cardiovascular: Heart size is mildly enlarged with left atrial dilatation. There is no significant pericardial fluid, thickening or pericardial calcification. There is aortic atherosclerosis, as well as atherosclerosis of the great vessels of the mediastinum and the coronary arteries, including calcified atherosclerotic plaque in the left main and left anterior descending coronary arteries. Dilatation of the pulmonic trunk (4 cm in diameter). Left-sided internal jugular single-lumen porta cath with tip terminating at the superior cavoatrial junction. Mediastinum/Nodes: No pathologically enlarged mediastinal, hilar or internal mammary lymph nodes. Small hiatal hernia. No axillary lymphadenopathy. Surgical clips in the right axilla related to prior lymph node dissection. Lungs/Pleura: Again noted are multiple pulmonary nodules scattered throughout the lungs bilaterally. These appears stable in number. Most lesions appears stable in size as well, including the largest nodule noted on the prior study in the medial aspect of the left lower lobe (axial image 105 of series 4) which again measures 12 mm. However, some nodules do appear slightly smaller than the prior study, best demonstrated by a 6 mm nodule in the left upper lobe (axial image 50 of Celsius useful) which previously measured 8 mm on 07/11/2017. No definite new pulmonary  nodules or masses are noted. Small amount of subpleural reticulation in the anterior aspect of the right upper lobe and right middle lobe deep to the right breast, presumably chronic postradiation changes. No acute consolidative airspace disease. No pleural effusions. Musculoskeletal: Focal area of architectural distortion in the medial aspect of the right breast, similar prior studies, compatible with the reported clinical history of lumpectomy. Multiple small sclerotic lesions are again noted throughout the visualized axial skeleton, with the largest lesion in T10 vertebral body, similar to prior examinations, compatible with widespread metastatic disease to bone. CT ABDOMEN PELVIS FINDINGS Hepatobiliary: No cystic or solid hepatic lesions. No intra or extrahepatic biliary ductal dilatation. Small calcified gallstone lying dependently in the gallbladder. No findings to suggest an acute cholecystitis at this time. Pancreas: No pancreatic mass. No pancreatic ductal dilatation. No pancreatic or peripancreatic fluid or inflammatory changes. Spleen: Unremarkable. Adrenals/Urinary Tract: Bilateral kidneys and bilateral adrenal glands are normal in appearance. No hydroureteronephrosis. Urinary bladder is  completely obscured by beam hardening artifact from bilateral hip arthroplasties. Stomach/Bowel: Stomach is normal in appearance. No pathologic dilatation of small bowel or colon. Extensive colonic diverticulosis without surrounding inflammatory changes to strongly suggest an acute diverticulitis at this time. Extensive mural thickening noted in mid sigmoid colon, potentially related to chronic diverticular disease (however, underlying mass is not excluded). The most worrisome segment of the sigmoid colon is best appreciated on axial image 94 of series 2 and coronal image 88 of series 5. Normal appendix. Vascular/Lymphatic: Aortic atherosclerosis, without evidence of aneurysm or dissection in the abdominal or pelvic  vasculature. No lymphadenopathy noted in the abdomen or pelvis. Reproductive: Status posthysterectomy. Ovaries are not confidently identified may be surgically absent or atrophic. Other: No significant volume of ascites.  No pneumoperitoneum. Musculoskeletal: Multiple tiny sclerotic osseous lesions are again noted, most evident and L3, L5 and S1 vertebral bodies. No new osseous lesions are otherwise noted. Status post bilateral hip arthroplasty. IMPRESSION: 1. Although there may be slight regression in the size of some pulmonary nodules, today's examination is otherwise essentially unchanged compared to prior study 07/11/2017 in terms of the extensive metastatic disease in the lungs and skeleton, as detailed above. 2. Cardiomegaly with left atrial dilatation. 3. Aortic atherosclerosis, in addition to left main and left anterior descending coronary artery disease. Please note that although the presence of coronary artery calcium documents the presence of coronary artery disease, the severity of this disease and any potential stenosis cannot be assessed on this non-gated CT examination. Assessment for potential risk factor modification, dietary therapy or pharmacologic therapy may be warranted, if clinically indicated. 4. Dilatation of the pulmonic trunk (4 cm in diameter), concerning for pulmonary arterial hypertension. 5. Additional incidental findings, as above. Aortic Atherosclerosis (ICD10-I70.0). Electronically Signed   By: Vinnie Langton M.D.   On: 10/26/2017 14:46   Ct Abdomen Pelvis W Contrast  Result Date: 10/26/2017 CLINICAL DATA:  77 year old female with history of right-sided breast cancer originally diagnosed in 1995 treated with right-sided lumpectomy, followed by chemotherapy and radiation therapy. Bone metastases diagnosed in 2012 treated with radiation therapy. Ongoing oral chemotherapy. Shortness of breath on exertion. EXAM: CT CHEST, ABDOMEN, AND PELVIS WITH CONTRAST TECHNIQUE: Multidetector  CT imaging of the chest, abdomen and pelvis was performed following the standard protocol during bolus administration of intravenous contrast. CONTRAST:  23m ISOVUE-300 IOPAMIDOL (ISOVUE-300) INJECTION 61% COMPARISON:  CT the chest, abdomen and pelvis 07/11/2017. FINDINGS: CT CHEST FINDINGS Cardiovascular: Heart size is mildly enlarged with left atrial dilatation. There is no significant pericardial fluid, thickening or pericardial calcification. There is aortic atherosclerosis, as well as atherosclerosis of the great vessels of the mediastinum and the coronary arteries, including calcified atherosclerotic plaque in the left main and left anterior descending coronary arteries. Dilatation of the pulmonic trunk (4 cm in diameter). Left-sided internal jugular single-lumen porta cath with tip terminating at the superior cavoatrial junction. Mediastinum/Nodes: No pathologically enlarged mediastinal, hilar or internal mammary lymph nodes. Small hiatal hernia. No axillary lymphadenopathy. Surgical clips in the right axilla related to prior lymph node dissection. Lungs/Pleura: Again noted are multiple pulmonary nodules scattered throughout the lungs bilaterally. These appears stable in number. Most lesions appears stable in size as well, including the largest nodule noted on the prior study in the medial aspect of the left lower lobe (axial image 105 of series 4) which again measures 12 mm. However, some nodules do appear slightly smaller than the prior study, best demonstrated by a 6 mm nodule in the left  upper lobe (axial image 50 of Celsius useful) which previously measured 8 mm on 07/11/2017. No definite new pulmonary nodules or masses are noted. Small amount of subpleural reticulation in the anterior aspect of the right upper lobe and right middle lobe deep to the right breast, presumably chronic postradiation changes. No acute consolidative airspace disease. No pleural effusions. Musculoskeletal: Focal area of  architectural distortion in the medial aspect of the right breast, similar prior studies, compatible with the reported clinical history of lumpectomy. Multiple small sclerotic lesions are again noted throughout the visualized axial skeleton, with the largest lesion in T10 vertebral body, similar to prior examinations, compatible with widespread metastatic disease to bone. CT ABDOMEN PELVIS FINDINGS Hepatobiliary: No cystic or solid hepatic lesions. No intra or extrahepatic biliary ductal dilatation. Small calcified gallstone lying dependently in the gallbladder. No findings to suggest an acute cholecystitis at this time. Pancreas: No pancreatic mass. No pancreatic ductal dilatation. No pancreatic or peripancreatic fluid or inflammatory changes. Spleen: Unremarkable. Adrenals/Urinary Tract: Bilateral kidneys and bilateral adrenal glands are normal in appearance. No hydroureteronephrosis. Urinary bladder is completely obscured by beam hardening artifact from bilateral hip arthroplasties. Stomach/Bowel: Stomach is normal in appearance. No pathologic dilatation of small bowel or colon. Extensive colonic diverticulosis without surrounding inflammatory changes to strongly suggest an acute diverticulitis at this time. Extensive mural thickening noted in mid sigmoid colon, potentially related to chronic diverticular disease (however, underlying mass is not excluded). The most worrisome segment of the sigmoid colon is best appreciated on axial image 94 of series 2 and coronal image 88 of series 5. Normal appendix. Vascular/Lymphatic: Aortic atherosclerosis, without evidence of aneurysm or dissection in the abdominal or pelvic vasculature. No lymphadenopathy noted in the abdomen or pelvis. Reproductive: Status posthysterectomy. Ovaries are not confidently identified may be surgically absent or atrophic. Other: No significant volume of ascites.  No pneumoperitoneum. Musculoskeletal: Multiple tiny sclerotic osseous lesions are  again noted, most evident and L3, L5 and S1 vertebral bodies. No new osseous lesions are otherwise noted. Status post bilateral hip arthroplasty. IMPRESSION: 1. Although there may be slight regression in the size of some pulmonary nodules, today's examination is otherwise essentially unchanged compared to prior study 07/11/2017 in terms of the extensive metastatic disease in the lungs and skeleton, as detailed above. 2. Cardiomegaly with left atrial dilatation. 3. Aortic atherosclerosis, in addition to left main and left anterior descending coronary artery disease. Please note that although the presence of coronary artery calcium documents the presence of coronary artery disease, the severity of this disease and any potential stenosis cannot be assessed on this non-gated CT examination. Assessment for potential risk factor modification, dietary therapy or pharmacologic therapy may be warranted, if clinically indicated. 4. Dilatation of the pulmonic trunk (4 cm in diameter), concerning for pulmonary arterial hypertension. 5. Additional incidental findings, as above. Aortic Atherosclerosis (ICD10-I70.0). Electronically Signed   By: Vinnie Langton M.D.   On: 10/26/2017 14:46     ASSESSMENT/PLAN:  Breast cancer metastasized to bone, right [facility]) This is a very pleasant 77 year old African-American female with metastatic breast carcinoma status post treatment with Arimidex 1 mg by mouth daily since May 2012 Unfortunately the patient developed disease progression with new multiple pulmonary nodules in addition to questionable metastatic disease to the T10 vertebra. The patient was started on treatment with Arimidex 1 mg by mouth daily and Ibrance 125 mg daily for 21 days every 4 weeks and has been tolerating this treatment well except for pancytopenia.  Her dose  of Ibrance was changed to 100 mg p.o. daily for 21 days every 4 weeks in addition to Arimidex. However, the patient received 125 mg tablets and took 125  mg daily times 21 days for the last cycle. The patient continues to tolerate her treatment well with the exception of mild pancytopenia.  The patient was seen with Dr. Julien Nordmann.  Recent restaging CT scan was discussed with the patient.  Explained to the patient that she has stable disease despite the fact that she has a rising tumor marker.  Recommend that she continue on Arimidex along with Ibrance 100 mg daily times 21 days.  She will delayed the start of her Ibrance by 1 week to allow her counts to recover.  A new prescription for 100 mg tablets was sent to the [facility] long outpatient pharmacy.  The patient remains on Xgeva every 3 months.  Last dose was given on 09/27/2017.  The patient will return for follow-up in 5 weeks for evaluation and repeat blood work.  She was advised to call immediately if she has any concerning symptoms in the interval. The patient voices understanding of current disease status and treatment options and is in agreement with the current care plan. All questions were answered. The patient knows to call the clinic with any problems, questions or concerns. We can certainly see the patient much sooner if necessary.  Orders Placed This Encounter  Procedures  . CBC with Differential/Platelet    Standing Status:   Future    Standing Expiration Date:   11/03/2018  . Comprehensive metabolic panel    Standing Status:   Future    Standing Expiration Date:   11/03/2018  . CA 27.29    Standing Status:   Future    Standing Expiration Date:   11/03/2018    Mikey Bussing, DNP, AGPCNP-BC, AOCNP 11/03/17  ADDENDUM: Hematology/Oncology Attending: I had a face-to-face encounter with the patient.  I recommended her care plan.  This is a very pleasant 77 years old African-American female with metastatic breast adenocarcinoma.  She is currently undergoing treatment with Arimidex and Ibrance 100 mg p.o. daily for 21 days every 4 weeks.  The patient received Ibrance from [employer] and it  was at 125 mg p.o. daily she took it for the last 3 weeks.  She is feeling fine except for fatigue.  She has chemotherapy-induced pancytopenia. The patient had a repeat CT scan of the chest, abdomen and pelvis performed recently that showed no clear evidence for disease progression.  Her tumor marker has been elevated recently. I discussed the scan results with the patient and recommended for her to continue her current treatment with Arimidex and Ibrance but only at 100 mg p.o. daily for 21 days every 4 weeks.  She will take a break for 1 week before starting her next dose of Ibrance because of the pancytopenia. For the metastatic bone disease, the patient will continue her treatment with Xgeva every 3 months. She will come back for follow-up visit in 1 month for evaluation with repeat blood work. The patient was advised to call immediately if she has any concerning symptoms in the interval.  Disclaimer: This note was dictated with voice recognition software. Similar sounding words can inadvertently be transcribed and may be missed upon review. Eilleen Kempf, MD 11/04/17

## 2017-11-03 NOTE — Assessment & Plan Note (Signed)
This is a very pleasant 77 year old African-American female with metastatic breast carcinoma status post treatment with Arimidex 1 mg by mouth daily since May 2012 Unfortunately the patient developed disease progression with new multiple pulmonary nodules in addition to questionable metastatic disease to the T10 vertebra. The patient was started on treatment with Arimidex 1 mg by mouth daily and Ibrance 125 mg daily for 21 days every 4 weeks and has been tolerating this treatment well except for pancytopenia.  Her dose of Ibrance was changed to 100 mg p.o. daily for 21 days every 4 weeks in addition to Arimidex. However, the patient received 125 mg tablets and took 125 mg daily times 21 days for the last cycle. The patient continues to tolerate her treatment well with the exception of mild pancytopenia.  The patient was seen with Dr. Julien Nordmann.  Recent restaging CT scan was discussed with the patient.  Explained to the patient that she has stable disease despite the fact that she has a rising tumor marker.  Recommend that she continue on Arimidex along with Ibrance 100 mg daily times 21 days.  She will delayed the start of her Ibrance by 1 week to allow her counts to recover.  A new prescription for 100 mg tablets was sent to the [facility] long outpatient pharmacy.  The patient remains on Xgeva every 3 months.  Last dose was given on 09/27/2017.  The patient will return for follow-up in 5 weeks for evaluation and repeat blood work.  She was advised to call immediately if she has any concerning symptoms in the interval. The patient voices understanding of current disease status and treatment options and is in agreement with the current care plan. All questions were answered. The patient knows to call the clinic with any problems, questions or concerns. We can certainly see the patient much sooner if necessary.

## 2017-11-03 NOTE — Telephone Encounter (Signed)
Scheduled appt per 1/3 los - Gave patient AVS and calender per los.  

## 2017-11-03 NOTE — Telephone Encounter (Signed)
Oral Oncology Patient Advocate Encounter  Was successful in securing patient a $15,000 grant from [employer] to provide copayment coverage for [place]. This will keep the out of pocket expense at $0.   I have spoken with the patient..   The billing information is as follows and has been shared with [place].   Member ID: [id number] Group ID: [id number] RxBin: [id number] Dates of Eligibility: 10/04/2017 through 10/03/2018  Fabio Asa. [name], [place] Patient [place] [phone] 11/03/2017 2:51 PM

## 2017-11-04 LAB — CANCER ANTIGEN 27.29: CA 27.29: 160.1 U/mL — ABNORMAL HIGH (ref 0.0–38.6)

## 2017-12-06 MED FILL — ANASTROZOLE 1 MG TABLET: 1 | 90 days supply | Qty: 90 | Fill #1

## 2017-12-06 MED FILL — IBRANCE 100 MG CAPSULE: 100 | 21 days supply | Qty: 21 | Fill #0

## 2017-12-08 ENCOUNTER — Encounter: Payer: Self-pay | Admitting: Oncology

## 2017-12-08 ENCOUNTER — Telehealth: Payer: Self-pay | Admitting: *Deleted

## 2017-12-08 ENCOUNTER — Inpatient Hospital Stay: Payer: Medicare HMO | Attending: Oncology | Admitting: Oncology

## 2017-12-08 ENCOUNTER — Ambulatory Visit: Payer: Medicare Other

## 2017-12-08 ENCOUNTER — Telehealth: Payer: Self-pay | Admitting: Internal Medicine

## 2017-12-08 ENCOUNTER — Inpatient Hospital Stay: Payer: Medicare HMO

## 2017-12-08 VITALS — BP 161/73 | HR 75 | Temp 97.9°F | Resp 20 | Ht 63.0 in | Wt 202.4 lb

## 2017-12-08 DIAGNOSIS — Z79899 Other long term (current) drug therapy: Secondary | ICD-10-CM | POA: Diagnosis not present

## 2017-12-08 DIAGNOSIS — I10 Essential (primary) hypertension: Secondary | ICD-10-CM | POA: Insufficient documentation

## 2017-12-08 DIAGNOSIS — C7800 Secondary malignant neoplasm of unspecified lung: Secondary | ICD-10-CM | POA: Diagnosis not present

## 2017-12-08 DIAGNOSIS — Z170 Estrogen receptor positive status [ER+]: Secondary | ICD-10-CM | POA: Diagnosis not present

## 2017-12-08 DIAGNOSIS — C50911 Malignant neoplasm of unspecified site of right female breast: Secondary | ICD-10-CM | POA: Insufficient documentation

## 2017-12-08 DIAGNOSIS — K5900 Constipation, unspecified: Secondary | ICD-10-CM | POA: Diagnosis not present

## 2017-12-08 DIAGNOSIS — M1990 Unspecified osteoarthritis, unspecified site: Secondary | ICD-10-CM

## 2017-12-08 DIAGNOSIS — C7951 Secondary malignant neoplasm of bone: Principal | ICD-10-CM

## 2017-12-08 DIAGNOSIS — F419 Anxiety disorder, unspecified: Secondary | ICD-10-CM | POA: Diagnosis not present

## 2017-12-08 DIAGNOSIS — E785 Hyperlipidemia, unspecified: Secondary | ICD-10-CM | POA: Diagnosis not present

## 2017-12-08 DIAGNOSIS — Z5181 Encounter for therapeutic drug level monitoring: Secondary | ICD-10-CM

## 2017-12-08 DIAGNOSIS — E876 Hypokalemia: Secondary | ICD-10-CM

## 2017-12-08 DIAGNOSIS — M545 Low back pain: Secondary | ICD-10-CM | POA: Diagnosis not present

## 2017-12-08 DIAGNOSIS — Z8673 Personal history of transient ischemic attack (TIA), and cerebral infarction without residual deficits: Secondary | ICD-10-CM | POA: Insufficient documentation

## 2017-12-08 DIAGNOSIS — Z923 Personal history of irradiation: Secondary | ICD-10-CM | POA: Diagnosis not present

## 2017-12-08 DIAGNOSIS — Z79811 Long term (current) use of aromatase inhibitors: Secondary | ICD-10-CM

## 2017-12-08 DIAGNOSIS — D701 Agranulocytosis secondary to cancer chemotherapy: Secondary | ICD-10-CM | POA: Diagnosis not present

## 2017-12-08 DIAGNOSIS — J45909 Unspecified asthma, uncomplicated: Secondary | ICD-10-CM | POA: Diagnosis not present

## 2017-12-08 DIAGNOSIS — Z9221 Personal history of antineoplastic chemotherapy: Secondary | ICD-10-CM | POA: Insufficient documentation

## 2017-12-08 DIAGNOSIS — R42 Dizziness and giddiness: Secondary | ICD-10-CM | POA: Diagnosis not present

## 2017-12-08 DIAGNOSIS — Z90710 Acquired absence of both cervix and uterus: Secondary | ICD-10-CM | POA: Diagnosis not present

## 2017-12-08 LAB — CBC WITH DIFFERENTIAL/PLATELET
BASOS ABS: 0.1 10*3/uL (ref 0.0–0.1)
BASOS PCT: 2 %
EOS PCT: 1 %
Eosinophils Absolute: 0 10*3/uL (ref 0.0–0.5)
HCT: 30.2 % — ABNORMAL LOW (ref 34.8–46.6)
Hemoglobin: 10 g/dL — ABNORMAL LOW (ref 11.6–15.9)
Lymphocytes Relative: 21 %
Lymphs Abs: 0.5 10*3/uL — ABNORMAL LOW (ref 0.9–3.3)
MCH: 36.6 pg — ABNORMAL HIGH (ref 25.1–34.0)
MCHC: 33.1 g/dL (ref 31.5–36.0)
MCV: 110.6 fL — AB (ref 79.5–101.0)
MONO ABS: 0.4 10*3/uL (ref 0.1–0.9)
Monocytes Relative: 17 %
Neutro Abs: 1.5 10*3/uL (ref 1.5–6.5)
Neutrophils Relative %: 59 %
PLATELETS: 140 10*3/uL — AB (ref 145–400)
RBC: 2.73 MIL/uL — ABNORMAL LOW (ref 3.70–5.45)
RDW: 15.6 % — AB (ref 11.2–14.5)
WBC: 2.5 10*3/uL — ABNORMAL LOW (ref 3.9–10.3)

## 2017-12-08 LAB — COMPREHENSIVE METABOLIC PANEL
ALBUMIN: 2.9 g/dL — AB (ref 3.5–5.0)
ALT: 6 U/L (ref 0–55)
ANION GAP: 11 (ref 3–11)
AST: 20 U/L (ref 5–34)
Alkaline Phosphatase: 70 U/L (ref 40–150)
BUN: 9 mg/dL (ref 7–26)
CHLORIDE: 105 mmol/L (ref 98–109)
CO2: 25 mmol/L (ref 22–29)
Calcium: 8.7 mg/dL (ref 8.4–10.4)
Creatinine, Ser: 1.47 mg/dL — ABNORMAL HIGH (ref 0.60–1.10)
GFR calc Af Amer: 39 mL/min — ABNORMAL LOW (ref >60)
GFR, EST NON AFRICAN AMERICAN: 34 mL/min — AB (ref >60)
Glucose, Bld: 107 mg/dL (ref 70–140)
POTASSIUM: 3.3 mmol/L — AB (ref 3.5–5.1)
Sodium: 141 mmol/L (ref 136–145)
Total Bilirubin: 0.6 mg/dL (ref 0.2–1.2)
Total Protein: 7.6 g/dL (ref 6.4–8.3)

## 2017-12-08 MED ORDER — ALPRAZOLAM 0.5 MG PO TABS: 0.5000 mg | ORAL_TABLET | Freq: Two times a day (BID) | ORAL | 0 refills | Status: DC | PRN

## 2017-12-08 MED ORDER — POTASSIUM CHLORIDE CRYS ER 20 MEQ PO TBCR: EXTENDED_RELEASE_TABLET | ORAL | 0 refills | Status: DC

## 2017-12-08 MED FILL — POTASSIUM CL ER 20 MEQ TABL: 20 | 5 days supply | Qty: 5 | Fill #0

## 2017-12-08 MED FILL — ALPRAZolam 0.5 MG TABS: 0.5 | 15 days supply | Qty: 30 | Fill #0

## 2017-12-08 NOTE — Assessment & Plan Note (Signed)
This is a very pleasant 77 year old African-American female with metastatic breast carcinoma status post treatment with Arimidex 1 mg by mouth daily since May 2012 Unfortunately the patient developed disease progression with new multiple pulmonary nodules in addition to questionable metastatic disease to the T10 vertebra. The patient was started on treatment with Arimidex 1 mg by mouth daily and Ibrance 125 mg daily for 21 days every 4 weeks and has been tolerating this treatment well except for pancytopenia.Her dose of Ibrance was changed to 100 mg p.o. daily for 21 days every 4 weeks in addition to Arimidex due to pancytopenia. The patient has been tolerating the treatment fairly well.   Recommend that she continue on Arimidex along with Ibrance 100 mg daily times 21 days.  She is to start again tomorrow and plans to pick up her medication at the pharmacy today.  Her potassium level is low today at 3.3.  I have sent a prescription for K-Dur 20 mEq daily for 5 days to her local pharmacy.  We will recheck her potassium level at her next visit.  Being out of her Xanax which is typically prescribed by her primary care provider.  She has not been able to contact them to request refill.  I have given her 30 tablets since she is out with further refills to come from her primary care provider.  Her blood pressure is elevated and she is out of her blood pressure medications.  I have encouraged her to call her primary care provider to get an appointment for refills.  We have also contacted his office on her behalf to arrange appointment.  The patient remains on Xgeva every 3 months.  Last dose was given on 09/27/2017.  The patient will return for follow-up in 4 weeks for evaluation and repeat blood work.  She was advised to call immediately if she has any concerning symptoms in the interval. The patient voices understanding of current disease status and treatment options and is in agreement with the  current care plan. All questions were answered. The patient knows to call the clinic with any problems, questions or concerns. We can certainly see the patient much sooner if necessary.
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
## 2017-12-08 NOTE — Telephone Encounter (Signed)
Gave avs and calendar for February - may °

## 2017-12-09 LAB — CANCER ANTIGEN 27.29: CA 27.29: 120.7 U/mL — ABNORMAL HIGH (ref 0.0–38.6)

## 2017-12-14 ENCOUNTER — Ambulatory Visit: Payer: Medicare HMO | Admitting: Internal Medicine

## 2017-12-20 ENCOUNTER — Ambulatory Visit (INDEPENDENT_AMBULATORY_CARE_PROVIDER_SITE_OTHER): Payer: Medicare HMO | Admitting: Internal Medicine

## 2017-12-20 ENCOUNTER — Encounter: Payer: Self-pay | Admitting: Internal Medicine

## 2017-12-20 DIAGNOSIS — C50911 Malignant neoplasm of unspecified site of right female breast: Secondary | ICD-10-CM

## 2017-12-20 DIAGNOSIS — C7951 Secondary malignant neoplasm of bone: Secondary | ICD-10-CM | POA: Diagnosis not present

## 2017-12-20 DIAGNOSIS — F439 Reaction to severe stress, unspecified: Secondary | ICD-10-CM

## 2017-12-20 DIAGNOSIS — E559 Vitamin D deficiency, unspecified: Secondary | ICD-10-CM | POA: Diagnosis not present

## 2017-12-20 DIAGNOSIS — I10 Essential (primary) hypertension: Secondary | ICD-10-CM

## 2017-12-20 DIAGNOSIS — J4520 Mild intermittent asthma, uncomplicated: Secondary | ICD-10-CM | POA: Diagnosis not present

## 2017-12-20 DIAGNOSIS — I700 Atherosclerosis of aorta: Secondary | ICD-10-CM | POA: Diagnosis not present

## 2017-12-20 DIAGNOSIS — E1329 Other specified diabetes mellitus with other diabetic kidney complication: Secondary | ICD-10-CM

## 2017-12-20 MED ORDER — LOSARTAN POTASSIUM-HCTZ 100-25 MG PO TABS: 1.0000 | ORAL_TABLET | Freq: Every day | ORAL | 3 refills | Status: DC

## 2017-12-20 MED ORDER — AMLODIPINE BESYLATE 10 MG PO TABS: 5.0000 mg | ORAL_TABLET | Freq: Every day | ORAL | 1 refills | Status: DC
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
## 2017-12-20 NOTE — Assessment & Plan Note (Signed)
Labs

## 2017-12-20 NOTE — Assessment & Plan Note (Signed)
Worse Re-start meds

## 2017-12-20 NOTE — Assessment & Plan Note (Signed)
PRN MDI use

## 2017-12-20 NOTE — Assessment & Plan Note (Signed)
Vit D 

## 2017-12-20 NOTE — Assessment & Plan Note (Signed)
[name] [name]
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
## 2017-12-27 MED FILL — IBRANCE 100 MG CAPSULE: 100 | 28 days supply | Qty: 21 | Fill #1

## 2017-12-28 ENCOUNTER — Other Ambulatory Visit: Payer: Self-pay | Admitting: Medical Oncology

## 2017-12-28 ENCOUNTER — Inpatient Hospital Stay: Payer: Medicare HMO

## 2017-12-28 VITALS — BP 149/73 | HR 85 | Temp 97.7°F | Resp 20

## 2017-12-28 DIAGNOSIS — C7951 Secondary malignant neoplasm of bone: Secondary | ICD-10-CM | POA: Diagnosis not present

## 2017-12-28 DIAGNOSIS — Z170 Estrogen receptor positive status [ER+]: Secondary | ICD-10-CM | POA: Diagnosis not present

## 2017-12-28 DIAGNOSIS — C7800 Secondary malignant neoplasm of unspecified lung: Secondary | ICD-10-CM | POA: Diagnosis not present

## 2017-12-28 DIAGNOSIS — E876 Hypokalemia: Secondary | ICD-10-CM | POA: Diagnosis not present

## 2017-12-28 DIAGNOSIS — Z79811 Long term (current) use of aromatase inhibitors: Secondary | ICD-10-CM | POA: Diagnosis not present

## 2017-12-28 DIAGNOSIS — C50911 Malignant neoplasm of unspecified site of right female breast: Secondary | ICD-10-CM

## 2017-12-28 DIAGNOSIS — F419 Anxiety disorder, unspecified: Secondary | ICD-10-CM | POA: Diagnosis not present

## 2017-12-28 DIAGNOSIS — I10 Essential (primary) hypertension: Secondary | ICD-10-CM | POA: Diagnosis not present

## 2017-12-28 DIAGNOSIS — K5900 Constipation, unspecified: Secondary | ICD-10-CM | POA: Diagnosis not present

## 2017-12-28 DIAGNOSIS — Z95828 Presence of other vascular implants and grafts: Secondary | ICD-10-CM

## 2017-12-28 LAB — CBC WITH DIFFERENTIAL (CANCER CENTER ONLY)
BASOS ABS: 0.1 10*3/uL (ref 0.0–0.1)
Basophils Relative: 2 %
EOS ABS: 0 10*3/uL (ref 0.0–0.5)
EOS PCT: 1 %
HCT: 28.8 % — ABNORMAL LOW (ref 34.8–46.6)
HEMOGLOBIN: 9.3 g/dL — AB (ref 11.6–15.9)
LYMPHS ABS: 0.5 10*3/uL — AB (ref 0.9–3.3)
LYMPHS PCT: 22 %
MCH: 36.2 pg — AB (ref 25.1–34.0)
MCHC: 32.3 g/dL (ref 31.5–36.0)
MCV: 112.1 fL — ABNORMAL HIGH (ref 79.5–101.0)
Monocytes Absolute: 0.1 10*3/uL (ref 0.1–0.9)
Monocytes Relative: 7 %
NEUTROS PCT: 68 %
Neutro Abs: 1.4 10*3/uL — ABNORMAL LOW (ref 1.5–6.5)
PLATELETS: 155 10*3/uL (ref 145–400)
RBC: 2.57 MIL/uL — AB (ref 3.70–5.45)
RDW: 15.4 % — ABNORMAL HIGH (ref 11.2–14.5)
WBC Count: 2.1 10*3/uL — ABNORMAL LOW (ref 3.9–10.3)

## 2017-12-28 LAB — CMP (CANCER CENTER ONLY)
ALT: 11 U/L (ref 0–55)
AST: 28 U/L (ref 5–34)
Albumin: 3.1 g/dL — ABNORMAL LOW (ref 3.5–5.0)
Alkaline Phosphatase: 70 U/L (ref 40–150)
Anion gap: 9 (ref 3–11)
BILIRUBIN TOTAL: 0.7 mg/dL (ref 0.2–1.2)
BUN: 14 mg/dL (ref 7–26)
CHLORIDE: 105 mmol/L (ref 98–109)
CO2: 27 mmol/L (ref 22–29)
CREATININE: 1.74 mg/dL — AB (ref 0.60–1.10)
Calcium: 9.1 mg/dL (ref 8.4–10.4)
GFR, EST AFRICAN AMERICAN: 32 mL/min — AB (ref >60)
GFR, EST NON AFRICAN AMERICAN: 28 mL/min — AB (ref >60)
Glucose, Bld: 100 mg/dL (ref 70–140)
POTASSIUM: 3.8 mmol/L (ref 3.5–5.1)
Sodium: 141 mmol/L (ref 136–145)
TOTAL PROTEIN: 7.2 g/dL (ref 6.4–8.3)

## 2017-12-28 MED ORDER — DENOSUMAB 120 MG/1.7ML ~~LOC~~ SOLN
120.0000 mg | Freq: Once | SUBCUTANEOUS | Status: AC
Start: 2017-12-28 — End: 2017-12-28
  Administered 2017-12-28: 120 mg via SUBCUTANEOUS

## 2017-12-28 NOTE — Patient Instructions (Signed)
Denosumab injection What is this medicine? DENOSUMAB (den oh sue mab) slows bone breakdown. Prolia is used to treat osteoporosis in women after menopause and in men. Xgeva is used to prevent bone fractures and other bone problems caused by cancer bone metastases. Xgeva is also used to treat giant cell tumor of the bone. This medicine may be used for other purposes; ask your health care provider or pharmacist if you have questions. COMMON BRAND NAME(S): Prolia, XGEVA What should I tell my health care provider before I take this medicine? They need to know if you have any of these conditions: -dental disease -eczema -infection or history of infections -kidney disease or on dialysis -low blood calcium or vitamin D -malabsorption syndrome -scheduled to have surgery or tooth extraction -taking medicine that contains denosumab -thyroid or parathyroid disease -an unusual reaction to denosumab, other medicines, foods, dyes, or preservatives -pregnant or trying to get pregnant -breast-feeding How should I use this medicine? This medicine is for injection under the skin. It is given by a health care professional in a hospital or clinic setting. If you are getting Prolia, a special MedGuide will be given to you by the pharmacist with each prescription and refill. Be sure to read this information carefully each time. For Prolia, talk to your pediatrician regarding the use of this medicine in children. Special care may be needed. For Xgeva, talk to your pediatrician regarding the use of this medicine in children. While this drug may be prescribed for children as young as 13 years for selected conditions, precautions do apply. Overdosage: If you think you've taken too much of this medicine contact a poison control center or emergency room at once. Overdosage: If you think you have taken too much of this medicine contact a poison control center or emergency room at once. NOTE: This medicine is only for  you. Do not share this medicine with others. What if I miss a dose? It is important not to miss your dose. Call your doctor or health care professional if you are unable to keep an appointment. What may interact with this medicine? Do not take this medicine with any of the following medications: -other medicines containing denosumab This medicine may also interact with the following medications: -medicines that suppress the immune system -medicines that treat cancer -steroid medicines like prednisone or cortisone This list may not describe all possible interactions. Give your health care provider a list of all the medicines, herbs, non-prescription drugs, or dietary supplements you use. Also tell them if you smoke, drink alcohol, or use illegal drugs. Some items may interact with your medicine. What should I watch for while using this medicine? Visit your doctor or health care professional for regular checks on your progress. Your doctor or health care professional may order blood tests and other tests to see how you are doing. Call your doctor or health care professional if you get a cold or other infection while receiving this medicine. Do not treat yourself. This medicine may decrease your body's ability to fight infection. You should make sure you get enough calcium and vitamin D while you are taking this medicine, unless your doctor tells you not to. Discuss the foods you eat and the vitamins you take with your health care professional. See your dentist regularly. Brush and floss your teeth as directed. Before you have any dental work done, tell your dentist you are receiving this medicine. Do not become pregnant while taking this medicine or for 5 months after stopping   it. Women should inform their doctor if they wish to become pregnant or think they might be pregnant. There is a potential for serious side effects to an unborn child. Talk to your health care professional or pharmacist for more  information. What side effects may I notice from receiving this medicine? Side effects that you should report to your doctor or health care professional as soon as possible: -allergic reactions like skin rash, itching or hives, swelling of the face, lips, or tongue -breathing problems -chest pain -fast, irregular heartbeat -feeling faint or lightheaded, falls -fever, chills, or any other sign of infection -muscle spasms, tightening, or twitches -numbness or tingling -skin blisters or bumps, or is dry, peels, or red -slow healing or unexplained pain in the mouth or jaw -unusual bleeding or bruising Side effects that usually do not require medical attention (Report these to your doctor or health care professional if they continue or are bothersome.): -muscle pain -stomach upset, gas This list may not describe all possible side effects. Call your doctor for medical advice about side effects. You may report side effects to FDA at 1-800-FDA-1088. Where should I keep my medicine? This medicine is only given in a clinic, doctor's office, or other health care setting and will not be stored at home. NOTE: This sheet is a summary. It may not cover all possible information. If you have questions about this medicine, talk to your doctor, pharmacist, or health care provider.  2015, [employer] Standard. (2012-04-17 12:37:47)  

## 2018-01-05 ENCOUNTER — Telehealth: Payer: Self-pay | Admitting: Oncology

## 2018-01-05 ENCOUNTER — Encounter: Payer: Self-pay | Admitting: Oncology

## 2018-01-05 ENCOUNTER — Inpatient Hospital Stay: Payer: Medicare HMO | Attending: Oncology | Admitting: Oncology

## 2018-01-05 ENCOUNTER — Inpatient Hospital Stay: Payer: Medicare HMO

## 2018-01-05 ENCOUNTER — Other Ambulatory Visit: Payer: Self-pay

## 2018-01-05 VITALS — BP 164/69 | HR 82 | Temp 98.1°F | Resp 20 | Ht 63.0 in | Wt 200.3 lb

## 2018-01-05 DIAGNOSIS — I10 Essential (primary) hypertension: Secondary | ICD-10-CM | POA: Insufficient documentation

## 2018-01-05 DIAGNOSIS — D701 Agranulocytosis secondary to cancer chemotherapy: Secondary | ICD-10-CM | POA: Insufficient documentation

## 2018-01-05 DIAGNOSIS — Z79899 Other long term (current) drug therapy: Secondary | ICD-10-CM

## 2018-01-05 DIAGNOSIS — J45909 Unspecified asthma, uncomplicated: Secondary | ICD-10-CM | POA: Insufficient documentation

## 2018-01-05 DIAGNOSIS — R918 Other nonspecific abnormal finding of lung field: Secondary | ICD-10-CM

## 2018-01-05 DIAGNOSIS — C7951 Secondary malignant neoplasm of bone: Secondary | ICD-10-CM | POA: Diagnosis not present

## 2018-01-05 DIAGNOSIS — M545 Low back pain: Secondary | ICD-10-CM | POA: Insufficient documentation

## 2018-01-05 DIAGNOSIS — Z8673 Personal history of transient ischemic attack (TIA), and cerebral infarction without residual deficits: Secondary | ICD-10-CM | POA: Diagnosis not present

## 2018-01-05 DIAGNOSIS — M1990 Unspecified osteoarthritis, unspecified site: Secondary | ICD-10-CM

## 2018-01-05 DIAGNOSIS — D61818 Other pancytopenia: Secondary | ICD-10-CM

## 2018-01-05 DIAGNOSIS — E785 Hyperlipidemia, unspecified: Secondary | ICD-10-CM | POA: Diagnosis not present

## 2018-01-05 DIAGNOSIS — Z170 Estrogen receptor positive status [ER+]: Secondary | ICD-10-CM | POA: Diagnosis not present

## 2018-01-05 DIAGNOSIS — Z79811 Long term (current) use of aromatase inhibitors: Secondary | ICD-10-CM

## 2018-01-05 DIAGNOSIS — F419 Anxiety disorder, unspecified: Secondary | ICD-10-CM | POA: Diagnosis not present

## 2018-01-05 DIAGNOSIS — Z923 Personal history of irradiation: Secondary | ICD-10-CM | POA: Diagnosis not present

## 2018-01-05 DIAGNOSIS — C50911 Malignant neoplasm of unspecified site of right female breast: Secondary | ICD-10-CM | POA: Diagnosis not present

## 2018-01-05 DIAGNOSIS — K5900 Constipation, unspecified: Secondary | ICD-10-CM | POA: Diagnosis not present

## 2018-01-05 DIAGNOSIS — Z5181 Encounter for therapeutic drug level monitoring: Secondary | ICD-10-CM

## 2018-01-05 DIAGNOSIS — Z9221 Personal history of antineoplastic chemotherapy: Secondary | ICD-10-CM | POA: Diagnosis not present

## 2018-01-05 LAB — CBC WITH DIFFERENTIAL (CANCER CENTER ONLY)
Basophils Absolute: 0 10*3/uL (ref 0.0–0.1)
Basophils Relative: 2 %
EOS ABS: 0 10*3/uL (ref 0.0–0.5)
EOS PCT: 1 %
HCT: 28.9 % — ABNORMAL LOW (ref 34.8–46.6)
Hemoglobin: 9.5 g/dL — ABNORMAL LOW (ref 11.6–15.9)
LYMPHS ABS: 0.4 10*3/uL — AB (ref 0.9–3.3)
Lymphocytes Relative: 20 %
MCH: 37.1 pg — AB (ref 25.1–34.0)
MCHC: 33.1 g/dL (ref 31.5–36.0)
MCV: 112.2 fL — ABNORMAL HIGH (ref 79.5–101.0)
Monocytes Absolute: 0.2 10*3/uL (ref 0.1–0.9)
Monocytes Relative: 11 %
Neutro Abs: 1.3 10*3/uL — ABNORMAL LOW (ref 1.5–6.5)
Neutrophils Relative %: 66 %
PLATELETS: 131 10*3/uL — AB (ref 145–400)
RBC: 2.57 MIL/uL — AB (ref 3.70–5.45)
RDW: 17.3 % — ABNORMAL HIGH (ref 11.2–14.5)
WBC: 2 10*3/uL — AB (ref 3.9–10.3)

## 2018-01-05 LAB — CMP (CANCER CENTER ONLY)
ALT: 13 U/L (ref 0–55)
AST: 30 U/L (ref 5–34)
Albumin: 3.3 g/dL — ABNORMAL LOW (ref 3.5–5.0)
Alkaline Phosphatase: 73 U/L (ref 40–150)
Anion gap: 8 (ref 3–11)
BILIRUBIN TOTAL: 0.7 mg/dL (ref 0.2–1.2)
BUN: 12 mg/dL (ref 7–26)
CHLORIDE: 110 mmol/L — AB (ref 98–109)
CO2: 23 mmol/L (ref 22–29)
CREATININE: 1.55 mg/dL — AB (ref 0.60–1.10)
Calcium: 8.9 mg/dL (ref 8.4–10.4)
GFR, EST NON AFRICAN AMERICAN: 32 mL/min — AB (ref >60)
GFR, Est AFR Am: 37 mL/min — ABNORMAL LOW (ref >60)
Glucose, Bld: 109 mg/dL (ref 70–140)
Potassium: 5.1 mmol/L (ref 3.5–5.1)
Sodium: 141 mmol/L (ref 136–145)
TOTAL PROTEIN: 7.7 g/dL (ref 6.4–8.3)

## 2018-01-05 NOTE — Assessment & Plan Note (Addendum)
This is a very pleasant 77 year old African-American female with metastatic breast carcinoma status post treatment with Arimidex 1 mg by mouth daily since May 2012 Unfortunately the patient developed disease progression with new multiple pulmonary nodules in addition to questionable metastatic disease to the T10 vertebra. The patient was started on treatment with Arimidex 1 mg by mouth daily and Ibrance 125 mg daily for 21 days every 4 weeks and has been tolerating this treatment well except for pancytopenia.Her dose of Ibrance waschangedto 100 mg p.o. daily for 21 days every 4 weeks in addition to Arimidex due to pancytopenia. The patient has been tolerating the treatment fairly well.    The patient was seen with Dr. Julien Nordmann.  We discussed that her CA 27.29 tumor marker has been decreasing.  The CA 27.29 from today is pending.  Recommend that she continue on Arimidex along with Ibrance 100 mg daily times 21 days.   She will have a restaging CT scan of the chest, abdomen, pelvis as well as a bone scan in approximately 1 month.  She will follow-up 1-2 days after her scans to discuss the results.  For hypertension, she will continue to follow-up with her primary care provider for management any medication adjustments.  The patient remains on Xgeva every 3 months. Last dose was given on 12/28/2016.  She was advised to call immediately if she has any concerning symptoms in the interval. The patient voices understanding of current disease status and treatment options and is in agreement with the current care plan. All questions were answered. The patient knows to call the clinic with any problems, questions or concerns. We can certainly see the patient much sooner if necessary.

## 2018-01-05 NOTE — Progress Notes (Signed)
[facility] OFFICE PROGRESS NOTE  [name], [name], MD [place] [address]  DIAGNOSIS: Metastatic breast adenocarcinoma presented with bone metastasis in the left hip area this was initially diagnosed as a stage II (T2, N1, M0) invasive right breast carcinoma with positive estrogen and progesterone receptor as well as HER-2/neu in July 2002  PRIOR THERAPY: 1. Status post right lumpectomy with right axillary lymph node dissection revealing 4/9 lymph nodes that were positive for malignancy.  2. Status post 8 cycles of systemic chemotherapy with CMF, completed March 2003.  3. Status post radiotherapy to the remaining right breast under the care of Dr. Tammi Klippel.  4. Status post 2 years of treatment with tamoxifen, started July 2003, then switched to Femara by Dr. Humphrey Rolls in August 2005. The patient discontinued the treatment in February 2009.  5. Status post left total hip replacement on Apr 01, 2011, secondary to pathologic left femoral neck fracture. 6. Zometa 4 mg IV on 3 monthly basis. Discontinued secondary to renal insufficiency. 7. Arimidex 1 mg by mouth daily started 03/18/2011.   CURRENT THERAPY: 1. Arimidex 1 mg by mouth daily and Ibrance 125 mg by mouth daily for 21 days every 4 weeks. First dose started 04/25/2017. Leslee Home will be changed to 100 mg starting 08/22/2017. 2. Xgeva 120 g subcutaneously every 3 months.  INTERVAL HISTORY: [name] [name] 77 y.o. female returns for routine follow-up visit by herself.  The patient is feeling fine today and has no specific complaints.  She was recently seen by her primary care provider and receive refills of her blood pressure medications.  She is now taking these on a regular basis.  The patient denies fevers and chills.  Denies chest pain, shortness of breath, cough, hemoptysis.  Denies nausea, vomiting, diarrhea.  She reports constipation and tried a stool softener which was ineffective.  She has purchased a  laxative but has not yet taking this but plans to do this in the near future.  She is here for evaluation and repeat lab work.  MEDICAL HISTORY: Past Medical History:  Diagnosis Date  . Anxiety    hx of  . Asthma   . Breast cancer ([place])    2002; metastatic in 2012, right breast, spread to left hip in 2012  . Cerebrovascular accident [facility])    R thalamic CVA 01/2010  . Chemotherapy induced neutropenia ([place]) 08/15/2017  . Dyslipidemia   . Encounter for therapeutic drug monitoring 04/14/2017  . Goals of care, counseling/discussion 04/19/2017  . Hyperlipemia   . Hypertension   . Low back pain   . Osteoarthritis   . TIA (transient ischemic attack)    x2 in 2011  . Vertigo 2014    ALLERGIES:  has No Known Allergies.  MEDICATIONS:  Current Outpatient Medications  Medication Sig Dispense Refill  . ALPRAZolam (XANAX) 0.5 MG tablet Take 1 tablet (0.5 mg total) by mouth 2 (two) times daily as needed for anxiety or sleep. 30 tablet 0  . amLODipine (NORVASC) 10 MG tablet Take 0.5 tablets (5 mg total) by mouth daily. 90 tablet 1  . anastrozole (ARIMIDEX) 1 MG tablet Take 1 tablet (1 mg total) by mouth daily. 90 tablet 2  . Cholecalciferol (VITAMIN D3) 1000 UNITS CAPS Take 1 capsule by mouth daily.     Marland Kitchen losartan-hydrochlorothiazide (HYZAAR) 100-25 MG tablet Take 1 tablet by mouth daily. 90 tablet 3  . palbociclib (IBRANCE) 100 MG capsule Take 1 capsule (100 mg total) by mouth  daily with breakfast. Take whole with food. 21 capsule 2  . meclizine (ANTIVERT) 12.5 MG tablet Take 1 tablet (12.5 mg total) by mouth 2 (two) times daily as needed for dizziness. (Patient not taking: Reported on 01/05/2018) 10 tablet 0  . spironolactone (ALDACTONE) 50 MG tablet Take 1 tablet (50 mg total) by mouth daily. 90 tablet 3  . tiZANidine (ZANAFLEX) 4 MG tablet Take 4 mg by mouth every 6 (six) hours as needed for muscle spasms.     No current facility-administered medications for this visit.     Facility-Administered Medications Ordered in Other Visits  Medication Dose Route Frequency Provider Last Rate Last Dose  . alteplase (CATHFLO ACTIVASE) injection 2 mg  2 mg Intracatheter Once PRN Curt Bears, MD      . denosumab (XGEVA) injection 120 mg  120 mg Subcutaneous Once Curt Bears, MD        SURGICAL HISTORY:  Past Surgical History:  Procedure Laterality Date  . ABDOMINAL HYSTERECTOMY     complete  . BREAST LUMPECTOMY     Right breast 2002  . JOINT REPLACEMENT  2012   L hip due to breast ca met  . TOTAL HIP ARTHROPLASTY Right 10/28/2015   Procedure: RIGHT TOTAL HIP ARTHROPLASTY ANTERIOR APPROACH;  Surgeon: Paralee Cancel, MD;  Location: [facility];  Service: Orthopedics;  Laterality: Right;    REVIEW OF SYSTEMS:   Review of Systems  Constitutional: Negative for appetite change, chills, fatigue, fever and unexpected weight change.  HENT:   Negative for mouth sores, nosebleeds, sore throat and trouble swallowing.   Eyes: Negative for eye problems and icterus.  Respiratory: Negative for cough, hemoptysis, shortness of breath and wheezing.   Cardiovascular: Negative for chest pain and leg swelling.  Gastrointestinal: Negative for abdominal pain, diarrhea, nausea and vomiting. Positive for constipation. Genitourinary: Negative for bladder incontinence, difficulty urinating, dysuria, frequency and hematuria.   Musculoskeletal: Negative for back pain, gait problem, neck pain and neck stiffness.  Skin: Negative for itching and rash.  Neurological: Negative for dizziness, extremity weakness, gait problem, headaches, light-headedness and seizures.  Hematological: Negative for adenopathy. Does not bruise/bleed easily.  Psychiatric/Behavioral: Negative for confusion, depression and sleep disturbance. The patient is not nervous/anxious.     PHYSICAL EXAMINATION:  Blood pressure (!) 164/69, pulse 82, temperature 98.1 F (36.7 C), temperature source Oral, resp. rate 20, height  _0  (1.6 m), weight 200 lb 4.8 oz (90.9 kg), SpO2 100 %.  ECOG PERFORMANCE STATUS: 1 - Symptomatic but completely ambulatory  Physical Exam  Constitutional: Oriented to person, place, and time and well-developed, well-nourished, and in no distress. No distress.  HENT:  Head: Normocephalic and atraumatic.  Mouth/Throat: Oropharynx is clear and moist. No oropharyngeal exudate.  Eyes: Conjunctivae are normal. Right eye exhibits no discharge. Left eye exhibits no discharge. No scleral icterus.  Neck: Normal range of motion. Neck supple.  Cardiovascular: Normal rate, regular rhythm, normal heart sounds and intact distal pulses.   Pulmonary/Chest: Effort normal and breath sounds normal. No respiratory distress. No wheezes. No rales.  Abdominal: Soft. Bowel sounds are normal. Exhibits no distension and no mass. There is no tenderness.  Musculoskeletal: Normal range of motion. Exhibits no edema.  Lymphadenopathy:    No cervical adenopathy.  Neurological: Alert and oriented to person, place, and time. Exhibits normal muscle tone. Gait normal. Coordination normal.  Skin: Skin is warm and dry. No rash noted. Not diaphoretic. No erythema. No pallor.  Psychiatric: Mood, memory and judgment normal.  Vitals  reviewed.  LABORATORY DATA: Lab Results  Component Value Date   WBC 2.0 (L) 01/05/2018   HGB 10.0 (L) 12/08/2017   HCT 28.9 (L) 01/05/2018   MCV 112.2 (H) 01/05/2018   PLT 131 (L) 01/05/2018      Chemistry      Component Value Date/Time   NA 141 01/05/2018 0926   NA 140 11/03/2017 1044   K 5.1 01/05/2018 0926   K 3.5 11/03/2017 1044   CL 110 (H) 01/05/2018 0926   CL 109 (H) 04/25/2013 1023   CO2 23 01/05/2018 0926   CO2 23 11/03/2017 1044   BUN 12 01/05/2018 0926   BUN 9.7 11/03/2017 1044   CREATININE 1.55 (H) 01/05/2018 0926   CREATININE 1.4 (H) 11/03/2017 1044      Component Value Date/Time   CALCIUM 8.9 01/05/2018 0926   CALCIUM 9.2 11/03/2017 1044   ALKPHOS 73 01/05/2018  0926   ALKPHOS 74 11/03/2017 1044   AST 30 01/05/2018 0926   AST 27 11/03/2017 1044   ALT 13 01/05/2018 0926   ALT 16 11/03/2017 1044   BILITOT 0.7 01/05/2018 0926   BILITOT 0.75 11/03/2017 1044     Results for [name] [name], [name] [name] (MRN [id number]) as of 01/05/2018 10:46  Ref. Range 07/14/2017 08:42 08/15/2017 10:00 09/27/2017 13:15 11/03/2017 10:44 12/08/2017 08:10  CA 27.29 Latest Ref Range: 0.0 - 38.6 U/mL 106.5 (H) 112.9 (H) 165.6 (H) 160.1 (H) 120.7 (H)    RADIOGRAPHIC STUDIES:  No results found.   ASSESSMENT/PLAN:  Breast cancer metastasized to bone, right [facility]) This is a very pleasant 77 year old African-American female with metastatic breast carcinoma status post treatment with Arimidex 1 mg by mouth daily since May 2012 Unfortunately the patient developed disease progression with new multiple pulmonary nodules in addition to questionable metastatic disease to the T10 vertebra. The patient was started on treatment with Arimidex 1 mg by mouth daily and Ibrance 125 mg daily for 21 days every 4 weeks and has been tolerating this treatment well except for pancytopenia.Her dose of Ibrance waschangedto 100 mg p.o. daily for 21 days every 4 weeks in addition to Arimidex due to pancytopenia. The patient has been tolerating the treatment fairly well.    The patient was seen with Dr. Julien Nordmann.  We discussed that her CA 27.29 tumor marker has been decreasing.  The CA 27.29 from today is pending.  Recommend that she continue on Arimidex along with Ibrance 100 mg daily times 21 days.   She will have a restaging CT scan of the chest, abdomen, pelvis as well as a bone scan in approximately 1 month.  She will follow-up 1-2 days after her scans to discuss the results.  For hypertension, she will continue to follow-up with her primary care provider for management any medication adjustments.  The patient remains on Xgeva every 3 months. Last dose was given on 12/28/2016.  She was advised to call  immediately if she has any concerning symptoms in the interval. The patient voices understanding of current disease status and treatment options and is in agreement with the current care plan. All questions were answered. The patient knows to call the clinic with any problems, questions or concerns. We can certainly see the patient much sooner if necessary.  Orders Placed This Encounter  Procedures  . CT ABDOMEN PELVIS W CONTRAST    Standing Status:   Future    Standing Expiration Date:   01/06/2019    Order Specific Question:   If indicated  for the ordered procedure, I authorize the administration of contrast media per Radiology protocol    Answer:   Yes    Order Specific Question:   Preferred imaging location?    Answer:   [facility]    Order Specific Question:   Radiology Contrast Protocol - do NOT remove file path    Answer:   \\[email or web address]    Order Specific Question:   Reason for Exam additional comments    Answer:   Metastatic breast ca with bone mets. Restaging.  . CT CHEST W CONTRAST    Standing Status:   Future    Standing Expiration Date:   01/06/2019    Order Specific Question:   If indicated for the ordered procedure, I authorize the administration of contrast media per Radiology protocol    Answer:   Yes    Order Specific Question:   Preferred imaging location?    Answer:   [facility]    Order Specific Question:   Radiology Contrast Protocol - do NOT remove file path    Answer:   \\[email or web address]    Order Specific Question:   Reason for Exam additional comments    Answer:   Metastatic breast ca with bone mets. Restaging.  Marland Kitchen NM Bone Scan Whole Body    Standing Status:   Future    Standing Expiration Date:   01/05/2019    Order Specific Question:   If indicated for the ordered procedure, I authorize the administration of a radiopharmaceutical per Radiology protocol    Answer:   Yes    Order Specific  Question:   Preferred imaging location?    Answer:   [facility]    Order Specific Question:   Radiology Contrast Protocol - do NOT remove file path    Answer:   \\[email or web address]    Order Specific Question:   Reason for Exam additional comments    Answer:   Metastatic breast ca with bone mets. Restaging.  Marland Kitchen CBC with Differential (Cancer Center Only)    Standing Status:   Future    Standing Expiration Date:   01/06/2019  . CMP ([place] only)    Standing Status:   Future    Standing Expiration Date:   01/06/2019  . CA 27.29    Standing Status:   Future    Standing Expiration Date:   01/05/2019    Mikey Bussing, DNP, AGPCNP-BC, AOCNP 01/05/18  ADDENDUM: Hematology/Oncology Attending: I had a face-to-face encounter with the patient today.  I recommended her care plan.  This is a very pleasant 77 years old African-American female with metastatic breast adenocarcinoma.  She is currently on treatment with hormonal therapy with Arimidex and Ibrance. She is tolerating this treatment fairly well except for mild pancytopenia. I recommended for the patient to continue her treatment as planned.  We will see her back for follow-up visit in 1 month for evaluation after repeating CT scan of the chest, abdomen and pelvis as well as bone scan for restaging of her disease. The patient was advised to call immediately if she has any concerning symptoms in the interval.  Disclaimer: This note was dictated with voice recognition software. Similar sounding words can inadvertently be transcribed and may be missed upon review. Eilleen Kempf, MD 01/05/18

## 2018-01-05 NOTE — Telephone Encounter (Signed)
Gave patient avs and calendar with appts per 3/7 los.  °

## 2018-01-06 LAB — CANCER ANTIGEN 27.29: CAN 27.29: 118.6 U/mL — AB (ref 0.0–38.6)

## 2018-01-10 ENCOUNTER — Telehealth: Payer: Self-pay
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
## 2018-01-11 ENCOUNTER — Ambulatory Visit: Payer: Medicare Other

## 2018-01-12 ENCOUNTER — Telehealth: Payer: Self-pay

## 2018-01-12 NOTE — Telephone Encounter (Signed)
Oral Oncology Pharmacy Encounter Spoke with patient about her Ibrance 100mg , one tablet daily for 21 days, then 7 days off. Patient reported multiple side effects. Most notably she has significant fatigue and felt unable to get out of bed twice in the past week. Fatigue is accompanied by back pain and she has to sit down between tasks.  Patient also reports development of numbness and tingling in her right hand. Started to develop in late January and has worsened over time.  Constipation has improved now that she has established a new living situation, but still complains of decreased appetite and anxiety.  Patient was advised to call if other symptoms arose and expressed understanding.   Karmen Stabs, PharmD Candidate 01/12/18

## 2018-01-13 ENCOUNTER — Telehealth: Payer: Self-pay | Admitting: Medical Oncology

## 2018-01-13 ENCOUNTER — Ambulatory Visit: Payer: Medicare HMO | Admitting: Family

## 2018-01-13 DIAGNOSIS — N898 Other specified noninflammatory disorders of vagina: Secondary | ICD-10-CM

## 2018-01-13 NOTE — Telephone Encounter (Signed)
Spoke to Dr [facility] nurse - pt called her today re dark vaginal discharge/vaginal gas. I called pt -vaginal discharge since February intermittent-uses one pad a day. States color is "dark" and coming from vagina and has gas coming from vagina. She states she was 217 lb in nov and now 200 lbs. Poor  appetite.She wants to see Dr Raphael Gibney and needs a referral from PCP to get GYN. I told pt it is probably not related to her chemo pills.  Called [facility] with this information. Cc [name] and Mohamed.

## 2018-01-15 NOTE — Addendum Note (Signed)
Addended by: Cassandria Anger on: 01/15/2018 11:52 PM   Modules accepted: Orders

## 2018-01-15 NOTE — Telephone Encounter (Signed)
I will put a referral for the patient to see Dr. Raphael Gibney.  If her symptoms are worse she needs to call his office for an urgent appointment or go to the emergency room. Thank you

## 2018-02-06 ENCOUNTER — Ambulatory Visit (HOSPITAL_COMMUNITY)
Admission: RE | Admit: 2018-02-06 | Discharge: 2018-02-06 | Disposition: A | Discharge status: Home / Self Care | Payer: Medicare HMO | Source: Ambulatory Visit | Attending: Oncology | Admitting: Oncology

## 2018-02-06 ENCOUNTER — Inpatient Hospital Stay: Payer: Medicare HMO | Attending: Oncology

## 2018-02-06 DIAGNOSIS — I700 Atherosclerosis of aorta: Secondary | ICD-10-CM | POA: Insufficient documentation

## 2018-02-06 DIAGNOSIS — K6389 Other specified diseases of intestine: Secondary | ICD-10-CM | POA: Diagnosis not present

## 2018-02-06 DIAGNOSIS — Z8673 Personal history of transient ischemic attack (TIA), and cerebral infarction without residual deficits: Secondary | ICD-10-CM | POA: Insufficient documentation

## 2018-02-06 DIAGNOSIS — I2510 Atherosclerotic heart disease of native coronary artery without angina pectoris: Secondary | ICD-10-CM | POA: Insufficient documentation

## 2018-02-06 DIAGNOSIS — M6258 Muscle wasting and atrophy, not elsewhere classified, other site: Secondary | ICD-10-CM | POA: Diagnosis not present

## 2018-02-06 DIAGNOSIS — Z79811 Long term (current) use of aromatase inhibitors: Secondary | ICD-10-CM | POA: Insufficient documentation

## 2018-02-06 DIAGNOSIS — Z79899 Other long term (current) drug therapy: Secondary | ICD-10-CM | POA: Insufficient documentation

## 2018-02-06 DIAGNOSIS — M1990 Unspecified osteoarthritis, unspecified site: Secondary | ICD-10-CM | POA: Diagnosis not present

## 2018-02-06 DIAGNOSIS — M545 Low back pain: Secondary | ICD-10-CM | POA: Diagnosis not present

## 2018-02-06 DIAGNOSIS — K8020 Calculus of gallbladder without cholecystitis without obstruction: Secondary | ICD-10-CM | POA: Diagnosis not present

## 2018-02-06 DIAGNOSIS — F419 Anxiety disorder, unspecified: Secondary | ICD-10-CM | POA: Insufficient documentation

## 2018-02-06 DIAGNOSIS — Z923 Personal history of irradiation: Secondary | ICD-10-CM | POA: Diagnosis not present

## 2018-02-06 DIAGNOSIS — J45909 Unspecified asthma, uncomplicated: Secondary | ICD-10-CM | POA: Diagnosis not present

## 2018-02-06 DIAGNOSIS — Z9221 Personal history of antineoplastic chemotherapy: Secondary | ICD-10-CM | POA: Diagnosis not present

## 2018-02-06 DIAGNOSIS — E785 Hyperlipidemia, unspecified: Secondary | ICD-10-CM | POA: Diagnosis not present

## 2018-02-06 DIAGNOSIS — C7951 Secondary malignant neoplasm of bone: Secondary | ICD-10-CM | POA: Insufficient documentation

## 2018-02-06 DIAGNOSIS — R5383 Other fatigue: Secondary | ICD-10-CM | POA: Insufficient documentation

## 2018-02-06 DIAGNOSIS — R918 Other nonspecific abnormal finding of lung field: Secondary | ICD-10-CM | POA: Insufficient documentation

## 2018-02-06 DIAGNOSIS — C50911 Malignant neoplasm of unspecified site of right female breast: Secondary | ICD-10-CM | POA: Diagnosis not present

## 2018-02-06 DIAGNOSIS — Z170 Estrogen receptor positive status [ER+]: Secondary | ICD-10-CM | POA: Diagnosis not present

## 2018-02-06 DIAGNOSIS — I10 Essential (primary) hypertension: Secondary | ICD-10-CM | POA: Insufficient documentation

## 2018-02-06 DIAGNOSIS — D709 Neutropenia, unspecified: Secondary | ICD-10-CM | POA: Diagnosis not present

## 2018-02-06 LAB — CBC WITH DIFFERENTIAL (CANCER CENTER ONLY)
BASOS PCT: 2 %
Basophils Absolute: 0 10*3/uL (ref 0.0–0.1)
Eosinophils Absolute: 0 10*3/uL (ref 0.0–0.5)
Eosinophils Relative: 1 %
HEMATOCRIT: 29.5 % — AB (ref 34.8–46.6)
HEMOGLOBIN: 10 g/dL — AB (ref 11.6–15.9)
Lymphocytes Relative: 18 %
Lymphs Abs: 0.3 10*3/uL — ABNORMAL LOW (ref 0.9–3.3)
MCH: 38.4 pg — ABNORMAL HIGH (ref 25.1–34.0)
MCHC: 33.9 g/dL (ref 31.5–36.0)
MCV: 113.5 fL — AB (ref 79.5–101.0)
MONO ABS: 0.2 10*3/uL (ref 0.1–0.9)
MONOS PCT: 12 %
NEUTROS ABS: 1.1 10*3/uL — AB (ref 1.5–6.5)
NEUTROS PCT: 67 %
Platelet Count: 132 10*3/uL — ABNORMAL LOW (ref 145–400)
RBC: 2.6 MIL/uL — ABNORMAL LOW (ref 3.70–5.45)
RDW: 19 % — AB (ref 11.2–14.5)
WBC Count: 1.6 10*3/uL — ABNORMAL LOW (ref 3.9–10.3)

## 2018-02-06 LAB — CMP (CANCER CENTER ONLY)
ALBUMIN: 3.3 g/dL — AB (ref 3.5–5.0)
ALK PHOS: 71 U/L (ref 40–150)
ALT: 14 U/L (ref 0–55)
ANION GAP: 9 (ref 3–11)
AST: 22 U/L (ref 5–34)
BUN: 11 mg/dL (ref 7–26)
CALCIUM: 8.9 mg/dL (ref 8.4–10.4)
CHLORIDE: 107 mmol/L (ref 98–109)
CO2: 23 mmol/L (ref 22–29)
Creatinine: 1.06 mg/dL (ref 0.60–1.10)
GFR, EST AFRICAN AMERICAN: 58 mL/min — AB (ref >60)
GFR, Estimated: 50 mL/min — ABNORMAL LOW (ref >60)
GLUCOSE: 105 mg/dL (ref 70–140)
Potassium: 3.5 mmol/L (ref 3.5–5.1)
SODIUM: 139 mmol/L (ref 136–145)
Total Bilirubin: 0.5 mg/dL (ref 0.2–1.2)
Total Protein: 7.5 g/dL (ref 6.4–8.3)

## 2018-02-06 MED ORDER — IOHEXOL 300 MG/ML  SOLN
100.0000 mL | Freq: Once | INTRAMUSCULAR | Status: AC | PRN
  Administered 2018-02-06: 100 mL via INTRAVENOUS

## 2018-02-06 MED ORDER — IOPAMIDOL (ISOVUE-300) INJECTION 61%
30.0000 mL | Freq: Once | INTRAVENOUS | Status: AC | PRN
  Administered 2018-02-06: 30 mL via ORAL

## 2018-02-06 MED ORDER — IOPAMIDOL (ISOVUE-300) INJECTION 61%
INTRAVENOUS | Status: AC
  Filled 2018-02-06: qty 30

## 2018-02-06 MED FILL — IBRANCE 100 MG CAPSULE: 100 | 28 days supply | Qty: 21 | Fill #2

## 2018-02-07 ENCOUNTER — Telehealth: Payer: Self-pay | Admitting: Internal Medicine

## 2018-02-07 ENCOUNTER — Inpatient Hospital Stay (HOSPITAL_BASED_OUTPATIENT_CLINIC_OR_DEPARTMENT_OTHER): Payer: Medicare HMO | Admitting: Oncology

## 2018-02-07 ENCOUNTER — Encounter: Payer: Self-pay | Admitting: Oncology

## 2018-02-07 VITALS — BP 165/65 | HR 77 | Temp 98.2°F | Resp 17 | Ht 63.0 in | Wt 197.5 lb

## 2018-02-07 DIAGNOSIS — R918 Other nonspecific abnormal finding of lung field: Secondary | ICD-10-CM

## 2018-02-07 DIAGNOSIS — Z79899 Other long term (current) drug therapy: Secondary | ICD-10-CM

## 2018-02-07 DIAGNOSIS — J45909 Unspecified asthma, uncomplicated: Secondary | ICD-10-CM | POA: Diagnosis not present

## 2018-02-07 DIAGNOSIS — C50911 Malignant neoplasm of unspecified site of right female breast: Secondary | ICD-10-CM

## 2018-02-07 DIAGNOSIS — Z923 Personal history of irradiation: Secondary | ICD-10-CM

## 2018-02-07 DIAGNOSIS — Z79811 Long term (current) use of aromatase inhibitors: Secondary | ICD-10-CM | POA: Diagnosis not present

## 2018-02-07 DIAGNOSIS — Z170 Estrogen receptor positive status [ER+]: Secondary | ICD-10-CM | POA: Diagnosis not present

## 2018-02-07 DIAGNOSIS — D709 Neutropenia, unspecified: Secondary | ICD-10-CM | POA: Diagnosis not present

## 2018-02-07 DIAGNOSIS — Z8673 Personal history of transient ischemic attack (TIA), and cerebral infarction without residual deficits: Secondary | ICD-10-CM

## 2018-02-07 DIAGNOSIS — M1990 Unspecified osteoarthritis, unspecified site: Secondary | ICD-10-CM

## 2018-02-07 DIAGNOSIS — I10 Essential (primary) hypertension: Secondary | ICD-10-CM

## 2018-02-07 DIAGNOSIS — I2510 Atherosclerotic heart disease of native coronary artery without angina pectoris: Secondary | ICD-10-CM

## 2018-02-07 DIAGNOSIS — K8020 Calculus of gallbladder without cholecystitis without obstruction: Secondary | ICD-10-CM

## 2018-02-07 DIAGNOSIS — M545 Low back pain: Secondary | ICD-10-CM

## 2018-02-07 DIAGNOSIS — Z5181 Encounter for therapeutic drug level monitoring: Secondary | ICD-10-CM

## 2018-02-07 DIAGNOSIS — R5383 Other fatigue: Secondary | ICD-10-CM | POA: Diagnosis not present

## 2018-02-07 DIAGNOSIS — E785 Hyperlipidemia, unspecified: Secondary | ICD-10-CM

## 2018-02-07 DIAGNOSIS — Z9221 Personal history of antineoplastic chemotherapy: Secondary | ICD-10-CM

## 2018-02-07 DIAGNOSIS — C7951 Secondary malignant neoplasm of bone: Secondary | ICD-10-CM | POA: Diagnosis not present

## 2018-02-07 DIAGNOSIS — F419 Anxiety disorder, unspecified: Secondary | ICD-10-CM

## 2018-02-07 DIAGNOSIS — I700 Atherosclerosis of aorta: Secondary | ICD-10-CM

## 2018-02-07 LAB — CANCER ANTIGEN 27.29: CA 27.29: 125.5 U/mL — ABNORMAL HIGH (ref 0.0–38.6)

## 2018-02-07 MED ORDER — PALBOCICLIB 100 MG PO CAPS
100.0000 mg | ORAL_CAPSULE | Freq: Every day | ORAL | 2 refills | Status: DC
Start: 2018-02-07 — End: 2018-04-18

## 2018-02-07 NOTE — Telephone Encounter (Signed)
Appt scheduled AVS/Calendar printed per 4/9 los °

## 2018-02-07 NOTE — Progress Notes (Signed)
[facility] OFFICE PROGRESS NOTE  [name], [name], MD [place] [address]  DIAGNOSIS: Metastatic breast adenocarcinoma presented with bone metastasis in the left hip area this was initially diagnosed as a stage II (T2, N1, M0) invasive right breast carcinoma with positive estrogen and progesterone receptor as well as HER-2/neu in July 2002  PRIOR THERAPY: 1. Status post right lumpectomy with right axillary lymph node dissection revealing 4/9 lymph nodes that were positive for malignancy.  2. Status post 8 cycles of systemic chemotherapy with CMF, completed March 2003.  3. Status post radiotherapy to the remaining right breast under the care of Dr. Tammi Klippel.  4. Status post 2 years of treatment with tamoxifen, started July 2003, then switched to Femara by Dr. Humphrey Rolls in August 2005. The patient discontinued the treatment in February 2009.  5. Status post left total hip replacement on Apr 01, 2011, secondary to pathologic left femoral neck fracture. 6. Zometa 4 mg IV on 3 monthly basis. Discontinued secondary to renal insufficiency. 7. Arimidex 1 mg by mouth daily started 03/18/2011.   CURRENT THERAPY: 1. Arimidex 1 mg by mouth daily and Ibrance 125 mg by mouth daily for 21 days every 4 weeks. First dose started 04/25/2017. Leslee Home will be changed to 100 mg starting 08/22/2017. 2. Xgeva 120 g subcutaneously every 3 months.  INTERVAL HISTORY: Rayana Geurin Bown 77 y.o. female returns for routine follow-up visit by herself.  The patient is feeling fine today with no specific complaints except for fatigue.  The patient denies fevers and chills.  Denies chest pain, shortness of breath, cough, hemoptysis.  Denies nausea, vomiting, constipation, diarrhea.  She reports a decreased appetite at times but is trying to drink protein shakes.  She has lost a few pounds since her last visit a month ago.  Denies night sweats.  The patient supposed to have a bone scan prior to this  visit, but unfortunately that was not scheduled.  The patient had a recent restaging CT scan performed and is here to discuss results.  MEDICAL HISTORY: Past Medical History:  Diagnosis Date  . Anxiety    hx of  . Asthma   . Breast cancer ([place])    2002; metastatic in 2012, right breast, spread to left hip in 2012  . Cerebrovascular accident [facility])    R thalamic CVA 01/2010  . Chemotherapy induced neutropenia ([place]) 08/15/2017  . Dyslipidemia   . Encounter for therapeutic drug monitoring 04/14/2017  . Goals of care, counseling/discussion 04/19/2017  . Hyperlipemia   . Hypertension   . Low back pain   . Osteoarthritis   . TIA (transient ischemic attack)    x2 in 2011  . Vertigo 2014    ALLERGIES:  has No Known Allergies.  MEDICATIONS:  Current Outpatient Medications  Medication Sig Dispense Refill  . ALPRAZolam (XANAX) 0.5 MG tablet Take 1 tablet (0.5 mg total) by mouth 2 (two) times daily as needed for anxiety or sleep. 30 tablet 0  . amLODipine (NORVASC) 10 MG tablet Take 0.5 tablets (5 mg total) by mouth daily. 90 tablet 1  . anastrozole (ARIMIDEX) 1 MG tablet Take 1 tablet (1 mg total) by mouth daily. 90 tablet 2  . Cholecalciferol (VITAMIN D3) 1000 UNITS CAPS Take 1 capsule by mouth daily.     Marland Kitchen losartan-hydrochlorothiazide (HYZAAR) 100-25 MG tablet Take 1 tablet by mouth daily. 90 tablet 3  . palbociclib (IBRANCE) 100 MG capsule Take 1 capsule (100 mg total) by mouth  daily with breakfast. Take whole with food. 21 capsule 2  . spironolactone (ALDACTONE) 50 MG tablet Take 1 tablet (50 mg total) by mouth daily. 90 tablet 3  . meclizine (ANTIVERT) 12.5 MG tablet Take 1 tablet (12.5 mg total) by mouth 2 (two) times daily as needed for dizziness. (Patient not taking: Reported on 01/05/2018) 10 tablet 0  . tiZANidine (ZANAFLEX) 4 MG tablet Take 4 mg by mouth every 6 (six) hours as needed for muscle spasms.     No current facility-administered medications for this visit.     Facility-Administered Medications Ordered in Other Visits  Medication Dose Route Frequency Provider Last Rate Last Dose  . alteplase (CATHFLO ACTIVASE) injection 2 mg  2 mg Intracatheter Once PRN Curt Bears, MD      . denosumab (XGEVA) injection 120 mg  120 mg Subcutaneous Once Curt Bears, MD        SURGICAL HISTORY:  Past Surgical History:  Procedure Laterality Date  . ABDOMINAL HYSTERECTOMY     complete  . BREAST LUMPECTOMY     Right breast 2002  . JOINT REPLACEMENT  2012   L hip due to breast ca met  . TOTAL HIP ARTHROPLASTY Right 10/28/2015   Procedure: RIGHT TOTAL HIP ARTHROPLASTY ANTERIOR APPROACH;  Surgeon: Paralee Cancel, MD;  Location: [facility];  Service: Orthopedics;  Laterality: Right;    REVIEW OF SYSTEMS:   Review of Systems  Constitutional: Negative for chills, fever. Positive for fatigue, decreased appetite, and a 3 pound weight loss. HENT:   Negative for mouth sores, nosebleeds, sore throat and trouble swallowing.   Eyes: Negative for eye problems and icterus.  Respiratory: Negative for cough, hemoptysis, shortness of breath and wheezing.   Cardiovascular: Negative for chest pain and leg swelling.  Gastrointestinal: Negative for abdominal pain, constipation, diarrhea, nausea and vomiting.  Genitourinary: Negative for bladder incontinence, difficulty urinating, dysuria, frequency and hematuria.   Musculoskeletal: Negative for back pain, gait problem, neck pain and neck stiffness.  Skin: Negative for itching and rash.  Neurological: Negative for dizziness, extremity weakness, gait problem, headaches, light-headedness and seizures.  Hematological: Negative for adenopathy. Does not bruise/bleed easily.  Psychiatric/Behavioral: Negative for confusion, depression and sleep disturbance. The patient is not nervous/anxious.     PHYSICAL EXAMINATION:  Blood pressure (!) 165/65, pulse 77, temperature 98.2 F (36.8 C), temperature source Oral, resp. rate 17,  height _0  (1.6 m), weight 197 lb 8 oz (89.6 kg), SpO2 100 %.  ECOG PERFORMANCE STATUS: 1 - Symptomatic but completely ambulatory  Physical Exam  Constitutional: Oriented to person, place, and time and well-developed, well-nourished, and in no distress. No distress.  HENT:  Head: Normocephalic and atraumatic.  Mouth/Throat: Oropharynx is clear and moist. No oropharyngeal exudate.  Eyes: Conjunctivae are normal. Right eye exhibits no discharge. Left eye exhibits no discharge. No scleral icterus.  Neck: Normal range of motion. Neck supple.  Cardiovascular: Normal rate, regular rhythm, normal heart sounds and intact distal pulses.   Pulmonary/Chest: Effort normal and breath sounds normal. No respiratory distress. No wheezes. No rales.  Abdominal: Soft. Bowel sounds are normal. Exhibits no distension and no mass. There is no tenderness.  Musculoskeletal: Normal range of motion. Exhibits no edema.  Lymphadenopathy:    No cervical adenopathy.  Neurological: Alert and oriented to person, place, and time. Exhibits normal muscle tone. Gait normal. Coordination normal.  Skin: Skin is warm and dry. No rash noted. Not diaphoretic. No erythema. No pallor.  Psychiatric: Mood, memory and judgment normal.  Vitals reviewed.  LABORATORY DATA: Lab Results  Component Value Date   WBC 1.6 (L) 02/06/2018   HGB 10.0 (L) 12/08/2017   HCT 29.5 (L) 02/06/2018   MCV 113.5 (H) 02/06/2018   PLT 132 (L) 02/06/2018      Chemistry      Component Value Date/Time   NA 139 02/06/2018 0937   NA 140 11/03/2017 1044   K 3.5 02/06/2018 0937   K 3.5 11/03/2017 1044   CL 107 02/06/2018 0937   CL 109 (H) 04/25/2013 1023   CO2 23 02/06/2018 0937   CO2 23 11/03/2017 1044   BUN 11 02/06/2018 0937   BUN 9.7 11/03/2017 1044   CREATININE 1.06 02/06/2018 0937   CREATININE 1.4 (H) 11/03/2017 1044      Component Value Date/Time   CALCIUM 8.9 02/06/2018 0937   CALCIUM 9.2 11/03/2017 1044   ALKPHOS 71 02/06/2018  0937   ALKPHOS 74 11/03/2017 1044   AST 22 02/06/2018 0937   AST 27 11/03/2017 1044   ALT 14 02/06/2018 0937   ALT 16 11/03/2017 1044   BILITOT 0.5 02/06/2018 0937   BILITOT 0.75 11/03/2017 1044     Results for TRINADEE, VERHAGEN (MRN [id number]) as of 02/07/2018 12:16  Ref. Range 09/27/2017 13:15 11/03/2017 10:44 12/08/2017 08:10 01/05/2018 09:26 02/06/2018 09:37  CA 27.29 Latest Ref Range: 0.0 - 38.6 U/mL 165.6 (H) 160.1 (H) 120.7 (H) 118.6 (H) 125.5 (H)   RADIOGRAPHIC STUDIES:  Ct Chest W Contrast  Result Date: 02/06/2018 CLINICAL DATA:  Metastatic breast cancer, restaging EXAM: CT CHEST, ABDOMEN, AND PELVIS WITH CONTRAST TECHNIQUE: Multidetector CT imaging of the chest, abdomen and pelvis was performed following the standard protocol during bolus administration of intravenous contrast. CONTRAST:  171m OMNIPAQUE IOHEXOL 300 MG/ML  SOLN COMPARISON:  Multiple exams, including 10/26/2017 FINDINGS: CT CHEST FINDINGS Cardiovascular: Port-A-Cath tip: Lower SVC. Coronary, aortic arch, and branch vessel atherosclerotic vascular disease. Prominent main pulmonary artery compatible with pulmonary arterial hypertension. Stable appearance of soft tissue density surrounding the right axillary artery. Mediastinum/Nodes: No pathologic adenopathy. Lungs/Pleura: Stable right anterior subpleural reticulation favoring prior radiation therapy in the right upper lobe. Index peribronchovascular nodule in the right lower lobe 1.1 by 1.0 cm on image 98/4, previously the same. Peripheral nodule adjacent to the right diaphragm in the right lower lobe on image 110/4 measures 0.9 by 0.8 cm, formerly the same. Index 7 mm left upper lobe nodule on image 52/4, previously the same by my measurements. Of the various pleural-based nodules along the left lower lobe, an index nodule measures 1.2 by 0.8 cm, stable by my measurements. I do not encounter any new pulmonary nodules. Musculoskeletal: Stable atrophy of the right pectoralis  musculature. Stable degenerative sternoclavicular arthropathy. The sclerotic metastatic involvement of the T10 vertebra is slightly more pronounced. The sclerotic lesion at T9 measures 0.9 by 0.7 cm on image 91/6, formerly 0.7 by 0.6 cm. A small sclerotic lesion anteriorly in the T11 vertebral body measures 6 mm in diameter, previously 5 mm. There is some faint areas of sclerosis in the ribs which are indeterminate. Mild sclerosis in the sternal manubrium although part of this may be due to the degenerative sternoclavicular arthropathy. CT ABDOMEN PELVIS FINDINGS Hepatobiliary: Cholelithiasis.  Otherwise unremarkable. Pancreas: Unremarkable Spleen: Unremarkable Adrenals/Urinary Tract: The adrenal glands and kidneys appear normal. Distal ureters in urinary bladder obscured by streak artifact from the patient's bilateral hip implants. Stomach/Bowel: Again noted is wall thickening within a 6.5 cm segment of the sigmoid colon. There  are various diverticula in this vicinity and no surrounding inflammatory stranding. Wall thickening has been present to some degree back through 2014, but is increased on more recent studies such as 10/26/2017 and today's exam. Vascular/Lymphatic: Aortoiliac atherosclerotic vascular disease. No pathologic adenopathy. Reproductive: Prior hysterectomy. The vaginal cuff is obscured by streak artifact from the hip implants. Other: No supplemental non-categorized findings. Musculoskeletal: Bilateral hip implants are observed. 1.3 by 1.2 cm target sclerotic lesion in the S1 segment of the sacrum, previously 1.2 by 1.2 cm, essentially stable. Lower lumbar degenerative facet arthropathy. Grade 1 degenerative anterolisthesis at L4-5 with interbody fusion at L5-S1. Stable lucent and sclerotic lesion at L3. No new bony lesions are identified. IMPRESSION: 1. Stable pulmonary nodules. 2. Subtle increase in size of the thoracic spine sclerotic lesions. Essentially stable sclerotic lesions in the lumbar  spine and upper sacrum. These likely represent sclerotic manifestations of metastatic disease. The slight enlargement could reflect minimal bony progression but can also sometimes be seen in the setting of effective therapy causing increased sclerosis. 3. Between 2017 examinations and today, there has been some increase in soft tissue thickening along the right axillary artery. This is likely related to prior therapy, but strictly speaking tumor recurrence in this vicinity is not excluded. There is some adjacent atrophy of the right pectoralis musculature. 4. Stable notable wall thickening in the sigmoid colon, probably from diverticulosis, strictly speaking a tumor in this vicinity cannot be ruled out. 5. Other imaging findings of potential clinical significance: Aortic Atherosclerosis (ICD10-I70.0). Coronary atherosclerosis. Enlarged pulmonary arteries suggesting pulmonary arterial hypertension. Electronically Signed   By: Van Clines M.D.   On: 02/06/2018 15:01   Ct Abdomen Pelvis W Contrast  Result Date: 02/06/2018 CLINICAL DATA:  Metastatic breast cancer, restaging EXAM: CT CHEST, ABDOMEN, AND PELVIS WITH CONTRAST TECHNIQUE: Multidetector CT imaging of the chest, abdomen and pelvis was performed following the standard protocol during bolus administration of intravenous contrast. CONTRAST:  148m OMNIPAQUE IOHEXOL 300 MG/ML  SOLN COMPARISON:  Multiple exams, including 10/26/2017 FINDINGS: CT CHEST FINDINGS Cardiovascular: Port-A-Cath tip: Lower SVC. Coronary, aortic arch, and branch vessel atherosclerotic vascular disease. Prominent main pulmonary artery compatible with pulmonary arterial hypertension. Stable appearance of soft tissue density surrounding the right axillary artery. Mediastinum/Nodes: No pathologic adenopathy. Lungs/Pleura: Stable right anterior subpleural reticulation favoring prior radiation therapy in the right upper lobe. Index peribronchovascular nodule in the right lower lobe 1.1  by 1.0 cm on image 98/4, previously the same. Peripheral nodule adjacent to the right diaphragm in the right lower lobe on image 110/4 measures 0.9 by 0.8 cm, formerly the same. Index 7 mm left upper lobe nodule on image 52/4, previously the same by my measurements. Of the various pleural-based nodules along the left lower lobe, an index nodule measures 1.2 by 0.8 cm, stable by my measurements. I do not encounter any new pulmonary nodules. Musculoskeletal: Stable atrophy of the right pectoralis musculature. Stable degenerative sternoclavicular arthropathy. The sclerotic metastatic involvement of the T10 vertebra is slightly more pronounced. The sclerotic lesion at T9 measures 0.9 by 0.7 cm on image 91/6, formerly 0.7 by 0.6 cm. A small sclerotic lesion anteriorly in the T11 vertebral body measures 6 mm in diameter, previously 5 mm. There is some faint areas of sclerosis in the ribs which are indeterminate. Mild sclerosis in the sternal manubrium although part of this may be due to the degenerative sternoclavicular arthropathy. CT ABDOMEN PELVIS FINDINGS Hepatobiliary: Cholelithiasis.  Otherwise unremarkable. Pancreas: Unremarkable Spleen: Unremarkable Adrenals/Urinary Tract: The adrenal glands  and kidneys appear normal. Distal ureters in urinary bladder obscured by streak artifact from the patient's bilateral hip implants. Stomach/Bowel: Again noted is wall thickening within a 6.5 cm segment of the sigmoid colon. There are various diverticula in this vicinity and no surrounding inflammatory stranding. Wall thickening has been present to some degree back through 2014, but is increased on more recent studies such as 10/26/2017 and today's exam. Vascular/Lymphatic: Aortoiliac atherosclerotic vascular disease. No pathologic adenopathy. Reproductive: Prior hysterectomy. The vaginal cuff is obscured by streak artifact from the hip implants. Other: No supplemental non-categorized findings. Musculoskeletal: Bilateral hip  implants are observed. 1.3 by 1.2 cm target sclerotic lesion in the S1 segment of the sacrum, previously 1.2 by 1.2 cm, essentially stable. Lower lumbar degenerative facet arthropathy. Grade 1 degenerative anterolisthesis at L4-5 with interbody fusion at L5-S1. Stable lucent and sclerotic lesion at L3. No new bony lesions are identified. IMPRESSION: 1. Stable pulmonary nodules. 2. Subtle increase in size of the thoracic spine sclerotic lesions. Essentially stable sclerotic lesions in the lumbar spine and upper sacrum. These likely represent sclerotic manifestations of metastatic disease. The slight enlargement could reflect minimal bony progression but can also sometimes be seen in the setting of effective therapy causing increased sclerosis. 3. Between 2017 examinations and today, there has been some increase in soft tissue thickening along the right axillary artery. This is likely related to prior therapy, but strictly speaking tumor recurrence in this vicinity is not excluded. There is some adjacent atrophy of the right pectoralis musculature. 4. Stable notable wall thickening in the sigmoid colon, probably from diverticulosis, strictly speaking a tumor in this vicinity cannot be ruled out. 5. Other imaging findings of potential clinical significance: Aortic Atherosclerosis (ICD10-I70.0). Coronary atherosclerosis. Enlarged pulmonary arteries suggesting pulmonary arterial hypertension. Electronically Signed   By: Van Clines M.D.   On: 02/06/2018 15:01     ASSESSMENT/PLAN:  Breast cancer metastasized to bone, right [facility]) This is a very pleasant 77 year old African-American female with metastatic breast carcinoma status post treatment with Arimidex 1 mg by mouth daily since May 2012 Unfortunately the patient developed disease progression with new multiple pulmonary nodules in addition to questionable metastatic disease to the T10 vertebra. The patient was started on treatment with Arimidex 1 mg by  mouth daily and Ibrance 125 mg daily for 21 days every 4 weeks and has been tolerating this treatment well except for pancytopenia.Her dose of Ibrance waschangedto 100 mg p.o. daily for 21 days every 4 weeks in addition to Arimidexdue to pancytopenia. The patient has been tolerating the treatment fairly well. She had a recent restaging CT scan is here to discuss results.  The patient was seen with Dr. Julien Nordmann.  CT scan results were discussed with the patient which show overall stable disease.   There is a slight increase in the size of the thoracic spine sclerotic lesions.  Recommend further characterization with a bone scan.  The patient will have the scan performed in the next 1-2 weeks. Labs reviewed today.  Total white count is low at 1.6.  Recommend that she delayed the start of her Ibrance by 1 week.  She will continue Arimidex. The patient will follow-up in approximately 5 weeks for evaluation and repeat lab work.  For hypertension, she will continue to follow-up with her primary care provider for management any medication adjustments.  The patient remains on Xgeva every 3 months. Last dose was given on 12/28/2016.  She was advised to call immediately if she has  any concerning symptoms in the interval. The patient voices understanding of current disease status and treatment options and is in agreement with the current care plan. All questions were answered. The patient knows to call the clinic with any problems, questions or concerns. We can certainly see the patient much sooner if necessary.   Orders Placed This Encounter  Procedures  . CBC with Differential (Cancer Center Only)    Standing Status:   Future    Standing Expiration Date:   02/08/2019  . CMP ([facility] only)    Standing Status:   Future    Standing Expiration Date:   02/08/2019  . CA 27.29    Standing Status:   Future    Standing Expiration Date:   02/07/2019    Mikey Bussing, DNP, AGPCNP-BC,  AOCNP 02/07/18  ADDENDUM: Hematology/Oncology Attending: I had a face-to-face encounter with the patient today.  I recommended her care plan.  This is a very pleasant 77 years old African-American female with metastatic breast carcinoma.  She is currently on treatment with Arimidex and Ibrance 100 mg p.o. daily for 21 days every 4 weeks.  She has been tolerating this treatment well except for pancytopenia.  Her absolute neutrophil count is low today.  The patient had repeat imaging studies including CT scan of the chest, abdomen and pelvis that showed a stable disease except for increase in the sclerotic lesions in the thoracic spine.  She was supposed to have repeat bone scan performed with her imaging studies but this was not scheduled.  I recommended for the patient to proceed with the bone scan as previously ordered. She will continue her current treatment with Arimidex and Ibrance but will delay the start of the next cycle of her treatment by 1 week until improvement of her blood count. We will see her back for follow-up visit in 5 weeks for evaluation and repeat blood work. For the metastatic bone disease, she will continue her current treatment with Xgeva every 3 months. The patient was advised to call immediately if she has any concerning symptoms in the interval.  Disclaimer: This note was dictated with voice recognition software. Similar sounding words can inadvertently be transcribed and may be missed upon review. Eilleen Kempf, MD 02/07/18

## 2018-02-07 NOTE — Assessment & Plan Note (Signed)
This is a very pleasant 77 year old African-American female with metastatic breast carcinoma status post treatment with Arimidex 1 mg by mouth daily since May 2012 Unfortunately the patient developed disease progression with new multiple pulmonary nodules in addition to questionable metastatic disease to the T10 vertebra. The patient was started on treatment with Arimidex 1 mg by mouth daily and Ibrance 125 mg daily for 21 days every 4 weeks and has been tolerating this treatment well except for pancytopenia.Her dose of Ibrance waschangedto 100 mg p.o. daily for 21 days every 4 weeks in addition to Arimidexdue to pancytopenia. The patient has been tolerating the treatment fairly well. She had a recent restaging CT scan is here to discuss results.  The patient was seen with Dr. Julien Nordmann.  CT scan results were discussed with the patient which show overall stable disease.   There is a slight increase in the size of the thoracic spine sclerotic lesions.  Recommend further characterization with a bone scan.  The patient will have the scan performed in the next 1-2 weeks. Labs reviewed today.  Total white count is low at 1.6.  Recommend that she delayed the start of her Ibrance by 1 week.  She will continue Arimidex. The patient will follow-up in approximately 5 weeks for evaluation and repeat lab work.  For hypertension, she will continue to follow-up with her primary care provider for management any medication adjustments.  The patient remains on Xgeva every 3 months. Last dose was given on 12/28/2016.  She was advised to call immediately if she has any concerning symptoms in the interval. The patient voices understanding of current disease status and treatment options and is in agreement with the current care plan. All questions were answered. The patient knows to call the clinic with any problems, questions or concerns. We can certainly see the patient much sooner if necessary.

## 2018-02-21 ENCOUNTER — Inpatient Hospital Stay: Payer: Medicare HMO

## 2018-02-21 ENCOUNTER — Other Ambulatory Visit: Payer: Self-pay | Admitting: *Deleted

## 2018-02-21 DIAGNOSIS — J45909 Unspecified asthma, uncomplicated: Secondary | ICD-10-CM | POA: Diagnosis not present

## 2018-02-21 DIAGNOSIS — Z170 Estrogen receptor positive status [ER+]: Secondary | ICD-10-CM | POA: Diagnosis not present

## 2018-02-21 DIAGNOSIS — R5383 Other fatigue: Secondary | ICD-10-CM | POA: Diagnosis not present

## 2018-02-21 DIAGNOSIS — D709 Neutropenia, unspecified: Secondary | ICD-10-CM | POA: Diagnosis not present

## 2018-02-21 DIAGNOSIS — T451X5A Adverse effect of antineoplastic and immunosuppressive drugs, initial encounter: Principal | ICD-10-CM

## 2018-02-21 DIAGNOSIS — Z79811 Long term (current) use of aromatase inhibitors: Secondary | ICD-10-CM | POA: Diagnosis not present

## 2018-02-21 DIAGNOSIS — D701 Agranulocytosis secondary to cancer chemotherapy: Secondary | ICD-10-CM

## 2018-02-21 DIAGNOSIS — F419 Anxiety disorder, unspecified: Secondary | ICD-10-CM | POA: Diagnosis not present

## 2018-02-21 DIAGNOSIS — C50911 Malignant neoplasm of unspecified site of right female breast: Secondary | ICD-10-CM | POA: Diagnosis not present

## 2018-02-21 DIAGNOSIS — C7951 Secondary malignant neoplasm of bone: Secondary | ICD-10-CM | POA: Diagnosis not present

## 2018-02-21 DIAGNOSIS — I10 Essential (primary) hypertension: Secondary | ICD-10-CM | POA: Diagnosis not present

## 2018-02-21 LAB — CBC WITH DIFFERENTIAL/PLATELET
BASOS ABS: 0.1 10*3/uL (ref 0.0–0.1)
BASOS PCT: 3 %
EOS ABS: 0.1 10*3/uL (ref 0.0–0.5)
EOS PCT: 2 %
HCT: 31.2 % — ABNORMAL LOW (ref 34.8–46.6)
HEMOGLOBIN: 10.4 g/dL — AB (ref 11.6–15.9)
Lymphocytes Relative: 19 %
Lymphs Abs: 0.6 10*3/uL — ABNORMAL LOW (ref 0.9–3.3)
MCH: 37.2 pg — ABNORMAL HIGH (ref 25.1–34.0)
MCHC: 33.2 g/dL (ref 31.5–36.0)
MCV: 112.1 fL — ABNORMAL HIGH (ref 79.5–101.0)
Monocytes Absolute: 0.2 10*3/uL (ref 0.1–0.9)
Monocytes Relative: 5 %
NEUTROS PCT: 71 %
Neutro Abs: 2.3 10*3/uL (ref 1.5–6.5)
PLATELETS: 257 10*3/uL (ref 145–400)
RBC: 2.78 MIL/uL — AB (ref 3.70–5.45)
RDW: 16.9 % — ABNORMAL HIGH (ref 11.2–14.5)
WBC: 3.3 10*3/uL — AB (ref 3.9–10.3)

## 2018-02-21 LAB — CMP (CANCER CENTER ONLY)
ALBUMIN: 3.4 g/dL — AB (ref 3.5–5.0)
ALK PHOS: 81 U/L (ref 40–150)
ALT: 11 U/L (ref 0–55)
AST: 23 U/L (ref 5–34)
Anion gap: 12 — ABNORMAL HIGH (ref 3–11)
BUN: 13 mg/dL (ref 7–26)
CALCIUM: 9.1 mg/dL (ref 8.4–10.4)
CHLORIDE: 109 mmol/L (ref 98–109)
CO2: 22 mmol/L (ref 22–29)
Creatinine: 1.24 mg/dL — ABNORMAL HIGH (ref 0.60–1.10)
GFR, EST AFRICAN AMERICAN: 48 mL/min — AB (ref >60)
GFR, Estimated: 41 mL/min — ABNORMAL LOW (ref >60)
GLUCOSE: 95 mg/dL (ref 70–140)
Potassium: 3.9 mmol/L (ref 3.5–5.1)
SODIUM: 143 mmol/L (ref 136–145)
Total Bilirubin: 0.4 mg/dL (ref 0.2–1.2)
Total Protein: 7.6 g/dL (ref 6.4–8.3)

## 2018-02-24 ENCOUNTER — Encounter (HOSPITAL_COMMUNITY)
Admission: RE | Admit: 2018-02-24 | Discharge: 2018-02-24 | Disposition: A | Discharge status: Home / Self Care | Payer: Medicare HMO | Source: Ambulatory Visit | Attending: Oncology | Admitting: Oncology

## 2018-02-24 DIAGNOSIS — C801 Malignant (primary) neoplasm, unspecified: Secondary | ICD-10-CM | POA: Diagnosis not present

## 2018-02-24 DIAGNOSIS — C50911 Malignant neoplasm of unspecified site of right female breast: Secondary | ICD-10-CM | POA: Insufficient documentation

## 2018-02-24 DIAGNOSIS — C7951 Secondary malignant neoplasm of bone: Secondary | ICD-10-CM | POA: Diagnosis not present

## 2018-02-24 MED ORDER — TECHNETIUM TC 99M MEDRONATE IV KIT
20.4000 | PACK | Freq: Once | INTRAVENOUS | Status: AC | PRN
  Administered 2018-02-24: 20.4 via INTRAVENOUS

## 2018-02-27 MED FILL — ANASTROZOLE 1 MG TABLET: 1 | 90 days supply | Qty: 90 | Fill #2

## 2018-03-13 MED FILL — IBRANCE 100 MG CAPSULE: 100 | 28 days supply | Qty: 21 | Fill #0

## 2018-03-13 NOTE — Assessment & Plan Note (Addendum)
This is a very pleasant 77 year old African-American female with metastatic breast carcinoma status post treatment with Arimidex 1 mg by mouth daily since May 2012 Unfortunately the patient developed disease progression with new multiple pulmonary nodules in addition to questionable metastatic disease to the T10 vertebra. The patient was started on treatment with Arimidex 1 mg by mouth daily and Ibrance 125 mg daily for 21 days every 4 weeks and has been tolerating this treatment well except for pancytopenia.Her dose of Ibrance waschangedto 100 mg p.o. daily for 21 days every 4 weeks in addition to Arimidexdue to pancytopenia. The patient has been tolerating the treatment fairly well. She had a recent bone scan is here to discuss results. Bone scan was reviewed with the patient which shows overall stable disease.  Recommend that she continue her treatment with Ibrance and Arimidex.  She will continue Ibrance 100 mg daily x21 days every 4 weeks.  Counts have been reviewed and are adequate for treatment.  The patient had several questions regarding transportation and assistance in the home.  I have made a referral to social work to help her with transportation and possible home health aide.  The patient will follow up with [place] in approximately 4 weeks for evaluation and repeat lab work.  She will also be due for Xgeva on that date.  For hypertension, she will continue to follow-up with her primary care provider for management any medication adjustments.  She was advised to call immediately if she has any concerning symptoms in the interval. The patient voices understanding of current disease status and treatment options and is in agreement with the current care plan. All questions were answered. The patient knows to call the clinic with any problems, questions or concerns. We can certainly see the patient much sooner if necessary.  Plan was reviewed with Dr. Julien Nordmann.

## 2018-03-13 NOTE — Progress Notes (Signed)
[facility] OFFICE PROGRESS NOTE  [name], [name] Lacks, MD [place] [address]  DIAGNOSIS: Metastatic breast adenocarcinoma presented with bone metastasis in the left hip area this was initially diagnosed as a stage II (T2, N1, M0) invasive right breast carcinoma with positive estrogen and progesterone receptor as well as HER-2/neu in July 2002  PRIOR THERAPY: 1. Status post right lumpectomy with right axillary lymph node dissection revealing 4/9 lymph nodes that were positive for malignancy.  2. Status post 8 cycles of systemic chemotherapy with CMF, completed March 2003.  3. Status post radiotherapy to the remaining right breast under the care of Dr. Tammi Klippel.  4. Status post 2 years of treatment with tamoxifen, started July 2003, then switched to Femara by Dr. Humphrey Rolls in August 2005. The patient discontinued the treatment in February 2009.  5. Status post left total hip replacement on Apr 01, 2011, secondary to pathologic left femoral neck fracture. 6. Zometa 4 mg IV on 3 monthly basis. Discontinued secondary to renal insufficiency. 7. Arimidex 1 mg by mouth daily started 03/18/2011.  CURRENT THERAPY: 1. Arimidex 1 mg by mouth daily and Ibrance 125 mg by mouth daily for 21 days every 4 weeks. First dose started 04/25/2017. Leslee Home will be changed to 100 mg starting 08/22/2017. 2. Xgeva 120 g subcutaneously every 3 months.  INTERVAL HISTORY: [name] [name] 77 y.o. female returns for routine follow-up visit by herself.  The patient is feeling fine today and has no specific complaints for intermittent numbness to her right hand.  This been ongoing for several months now.  The patient denies fevers and chills.  Denies chest pain, shortness of breath, cough, hemoptysis.  Denies nausea, vomiting, constipation, diarrhea.  Patient has ongoing back pain which is unchanged from previous reports.  Nuys recent weight loss or night sweats.  The patient had a recent bone scan  is here to discuss the results.  MEDICAL HISTORY: Past Medical History:  Diagnosis Date  . Anxiety    hx of  . Asthma   . Breast cancer ([facility])    2002; metastatic in 2012, right breast, spread to left hip in 2012  . Cerebrovascular accident [facility])    R thalamic CVA 01/2010  . Chemotherapy induced neutropenia ([facility]) 08/15/2017  . Dyslipidemia   . Encounter for therapeutic drug monitoring 04/14/2017  . Goals of care, counseling/discussion 04/19/2017  . Hyperlipemia   . Hypertension   . Low back pain   . Osteoarthritis   . TIA (transient ischemic attack)    x2 in 2011  . Vertigo 2014    ALLERGIES:  has No Known Allergies.  MEDICATIONS:  Current Outpatient Medications  Medication Sig Dispense Refill  . ALPRAZolam (XANAX) 0.5 MG tablet Take 1 tablet (0.5 mg total) by mouth 2 (two) times daily as needed for anxiety or sleep. 30 tablet 0  . amLODipine (NORVASC) 10 MG tablet Take 0.5 tablets (5 mg total) by mouth daily. 90 tablet 1  . anastrozole (ARIMIDEX) 1 MG tablet Take 1 tablet (1 mg total) by mouth daily. 90 tablet 2  . Cholecalciferol (VITAMIN D3) 1000 UNITS CAPS Take 1 capsule by mouth daily.     Marland Kitchen losartan-hydrochlorothiazide (HYZAAR) 100-25 MG tablet Take 1 tablet by mouth daily. 90 tablet 3  . palbociclib (IBRANCE) 100 MG capsule Take 1 capsule (100 mg total) by mouth daily with breakfast. Take whole with food. 21 capsule 2  . meclizine (ANTIVERT) 12.5 MG tablet Take 1 tablet (12.5 mg  total) by mouth 2 (two) times daily as needed for dizziness. (Patient not taking: Reported on 01/05/2018) 10 tablet 0  . spironolactone (ALDACTONE) 50 MG tablet Take 1 tablet (50 mg total) by mouth daily. 90 tablet 3  . tiZANidine (ZANAFLEX) 4 MG tablet Take 4 mg by mouth every 6 (six) hours as needed for muscle spasms.     No current facility-administered medications for this visit.    Facility-Administered Medications Ordered in Other Visits  Medication Dose Route Frequency Provider Last Rate  Last Dose  . alteplase (CATHFLO ACTIVASE) injection 2 mg  2 mg Intracatheter Once PRN Curt Bears, MD      . denosumab (XGEVA) injection 120 mg  120 mg Subcutaneous Once Curt Bears, MD        SURGICAL HISTORY:  Past Surgical History:  Procedure Laterality Date  . ABDOMINAL HYSTERECTOMY     complete  . BREAST LUMPECTOMY     Right breast 2002  . JOINT REPLACEMENT  2012   L hip due to breast ca met  . TOTAL HIP ARTHROPLASTY Right 10/28/2015   Procedure: RIGHT TOTAL HIP ARTHROPLASTY ANTERIOR APPROACH;  Surgeon: Paralee Cancel, MD;  Location: [facility];  Service: Orthopedics;  Laterality: Right;    REVIEW OF SYSTEMS:   Review of Systems  Constitutional: Negative for appetite change, chills, fatigue, fever and unexpected weight change.  HENT:   Negative for mouth sores, nosebleeds, sore throat and trouble swallowing.   Eyes: Negative for eye problems and icterus.  Respiratory: Negative for cough, hemoptysis, shortness of breath and wheezing.   Cardiovascular: Negative for chest pain and leg swelling.  Gastrointestinal: Negative for abdominal pain, constipation, diarrhea, nausea and vomiting.  Genitourinary: Negative for bladder incontinence, difficulty urinating, dysuria, frequency and hematuria.   Musculoskeletal: Negative for gait problem, neck pain and neck stiffness. Positive for back pain which is unchanged from previous reports. Skin: Negative for itching and rash.  Neurological: Negative for dizziness, extremity weakness, gait problem, headaches, light-headedness and seizures.  Hematological: Negative for adenopathy. Does not bruise/bleed easily.  Psychiatric/Behavioral: Negative for confusion, depression and sleep disturbance. The patient is not nervous/anxious.     PHYSICAL EXAMINATION:  Blood pressure (!) 157/72, pulse 72, temperature 97.8 F (36.6 C), temperature source Oral, resp. rate 18, height _0  (1.6 m), weight 197 lb (89.4 kg), SpO2 100 %.  ECOG PERFORMANCE  STATUS: 1 - Symptomatic but completely ambulatory  Physical Exam  Constitutional: Oriented to person, place, and time and well-developed, well-nourished, and in no distress. No distress.  HENT:  Head: Normocephalic and atraumatic.  Mouth/Throat: Oropharynx is clear and moist. No oropharyngeal exudate.  Eyes: Conjunctivae are normal. Right eye exhibits no discharge. Left eye exhibits no discharge. No scleral icterus.  Neck: Normal range of motion. Neck supple.  Cardiovascular: Normal rate, regular rhythm, normal heart sounds and intact distal pulses.   Pulmonary/Chest: Effort normal and breath sounds normal. No respiratory distress. No wheezes. No rales.  Abdominal: Soft. Bowel sounds are normal. Exhibits no distension and no mass. There is no tenderness.  Musculoskeletal: Normal range of motion. Exhibits no edema.  Lymphadenopathy:    No cervical adenopathy.  Neurological: Alert and oriented to person, place, and time. Exhibits normal muscle tone. Gait normal. Coordination normal.  Skin: Skin is warm and dry. No rash noted. Not diaphoretic. No erythema. No pallor.  Psychiatric: Mood, memory and judgment normal.  Vitals reviewed.  LABORATORY DATA: Lab Results  Component Value Date   WBC 2.1 (L) 03/14/2018  HGB 10.5 (L) 03/14/2018   HCT 31.9 (L) 03/14/2018   MCV 113.9 (H) 03/14/2018   PLT 96 (L) 03/14/2018      Chemistry      Component Value Date/Time   NA 140 03/14/2018 0820   NA 140 11/03/2017 1044   K 3.8 03/14/2018 0820   K 3.5 11/03/2017 1044   CL 108 03/14/2018 0820   CL 109 (H) 04/25/2013 1023   CO2 25 03/14/2018 0820   CO2 23 11/03/2017 1044   BUN 14 03/14/2018 0820   BUN 9.7 11/03/2017 1044   CREATININE 1.24 (H) 03/14/2018 0820   CREATININE 1.4 (H) 11/03/2017 1044      Component Value Date/Time   CALCIUM 9.3 03/14/2018 0820   CALCIUM 9.2 11/03/2017 1044   ALKPHOS 76 03/14/2018 0820   ALKPHOS 74 11/03/2017 1044   AST 30 03/14/2018 0820   AST 27 11/03/2017  1044   ALT 14 03/14/2018 0820   ALT 16 11/03/2017 1044   BILITOT 0.5 03/14/2018 0820   BILITOT 0.75 11/03/2017 1044     Results for LACEE, GREY (MRN [id number]) as of 03/14/2018 08:34  Ref. Range 09/27/2017 13:15 11/03/2017 10:44 12/08/2017 08:10 01/05/2018 09:26 02/06/2018 09:37  CA 27.29 Latest Ref Range: 0.0 - 38.6 U/mL 165.6 (H) 160.1 (H) 120.7 (H) 118.6 (H) 125.5 (H)   RADIOGRAPHIC STUDIES:  Nm Bone Scan Whole Body  Result Date: 02/24/2018 CLINICAL DATA:  History of breast cancer with bone metastases. EXAM: NUCLEAR MEDICINE WHOLE BODY BONE SCAN TECHNIQUE: Whole body anterior and posterior images were obtained approximately 3 hours after intravenous injection of radiopharmaceutical. RADIOPHARMACEUTICALS:  20.4 mCi Technetium-71mMDP IV COMPARISON:  Whole-body bone scan 04/15/2017 and 12/29/2015. CT chest, abdomen and pelvis 02/06/2018. FINDINGS: Multifocal and lumbar spine is again seen uptake in the thoracic. Increased uptake in the T10 vertebral body appears increased in extent and intensity since the most recent bone scan. There is also new focus of increased uptake in the upper sacrum. Osseous uptake otherwise appears unchanged. Normal soft tissue uptake is identified. IMPRESSION: Some progression of disease with increase in the extent and intensity of uptake in T10 and a new focus of increased uptake in the sacrum. Electronically Signed   By: TInge RiseM.D.   On: 02/24/2018 15:10     ASSESSMENT/PLAN:  Breast cancer metastasized to bone, right ([facility] This is a very pleasant 77year old African-American female with metastatic breast carcinoma status post treatment with Arimidex 1 mg by mouth daily since May 2012 Unfortunately the patient developed disease progression with new multiple pulmonary nodules in addition to questionable metastatic disease to the T10 vertebra. The patient was started on treatment with Arimidex 1 mg by mouth daily and Ibrance 125 mg daily for 21 days every 4  weeks and has been tolerating this treatment well except for pancytopenia.Her dose of Ibrance waschangedto 100 mg p.o. daily for 21 days every 4 weeks in addition to Arimidexdue to pancytopenia. The patient has been tolerating the treatment fairly well. She had a recent bone scan is here to discuss results. Bone scan was reviewed with the patient which shows overall stable disease.  Recommend that she continue her treatment with Ibrance and Arimidex.  She will continue Ibrance 100 mg daily x21 days every 4 weeks.  Counts have been reviewed and are adequate for treatment.  The patient had several questions regarding transportation and assistance in the home.  I have made a referral to social work to help her with transportation  and possible home health aide.  The patient will follow up with [place] in approximately 4 weeks for evaluation and repeat lab work.  She will also be due for Xgeva on that date.  For hypertension, she will continue to follow-up with her primary care provider for management any medication adjustments.  She was advised to call immediately if she has any concerning symptoms in the interval. The patient voices understanding of current disease status and treatment options and is in agreement with the current care plan. All questions were answered. The patient knows to call the clinic with any problems, questions or concerns. We can certainly see the patient much sooner if necessary.  Plan was reviewed with Dr. Julien Nordmann.  Orders Placed This Encounter  Procedures  . CBC with Differential (Cancer Center Only)    Standing Status:   Future    Standing Expiration Date:   03/15/2019  . CMP ([facility] only)    Standing Status:   Future    Standing Expiration Date:   03/15/2019  . CA 27.29    Standing Status:   Future    Standing Expiration Date:   03/14/2019   Mikey Bussing, DNP, AGPCNP-BC, AOCNP 03/14/18

## 2018-03-14 ENCOUNTER — Encounter: Payer: Self-pay | Admitting: Oncology

## 2018-03-14 ENCOUNTER — Telehealth: Payer: Self-pay | Admitting: Oncology

## 2018-03-14 ENCOUNTER — Inpatient Hospital Stay: Payer: Medicare HMO

## 2018-03-14 ENCOUNTER — Other Ambulatory Visit: Payer: Self-pay

## 2018-03-14 ENCOUNTER — Inpatient Hospital Stay: Payer: Medicare HMO | Attending: Oncology | Admitting: Oncology

## 2018-03-14 ENCOUNTER — Other Ambulatory Visit: Payer: Self-pay | Admitting: Internal Medicine

## 2018-03-14 VITALS — BP 157/72 | HR 72 | Temp 97.8°F | Resp 18 | Ht 63.0 in | Wt 197.0 lb

## 2018-03-14 DIAGNOSIS — Z79899 Other long term (current) drug therapy: Secondary | ICD-10-CM | POA: Insufficient documentation

## 2018-03-14 DIAGNOSIS — C50911 Malignant neoplasm of unspecified site of right female breast: Secondary | ICD-10-CM | POA: Insufficient documentation

## 2018-03-14 DIAGNOSIS — Z9221 Personal history of antineoplastic chemotherapy: Secondary | ICD-10-CM | POA: Diagnosis not present

## 2018-03-14 DIAGNOSIS — Z170 Estrogen receptor positive status [ER+]: Secondary | ICD-10-CM

## 2018-03-14 DIAGNOSIS — E785 Hyperlipidemia, unspecified: Secondary | ICD-10-CM | POA: Diagnosis not present

## 2018-03-14 DIAGNOSIS — F419 Anxiety disorder, unspecified: Secondary | ICD-10-CM | POA: Diagnosis not present

## 2018-03-14 DIAGNOSIS — C7951 Secondary malignant neoplasm of bone: Secondary | ICD-10-CM

## 2018-03-14 DIAGNOSIS — I10 Essential (primary) hypertension: Secondary | ICD-10-CM | POA: Insufficient documentation

## 2018-03-14 DIAGNOSIS — Z8673 Personal history of transient ischemic attack (TIA), and cerebral infarction without residual deficits: Secondary | ICD-10-CM | POA: Diagnosis not present

## 2018-03-14 DIAGNOSIS — Z923 Personal history of irradiation: Secondary | ICD-10-CM | POA: Diagnosis not present

## 2018-03-14 DIAGNOSIS — M1990 Unspecified osteoarthritis, unspecified site: Secondary | ICD-10-CM | POA: Diagnosis not present

## 2018-03-14 DIAGNOSIS — Z79811 Long term (current) use of aromatase inhibitors: Secondary | ICD-10-CM

## 2018-03-14 DIAGNOSIS — Z5181 Encounter for therapeutic drug level monitoring: Secondary | ICD-10-CM

## 2018-03-14 LAB — CBC WITH DIFFERENTIAL (CANCER CENTER ONLY)
Basophils Absolute: 0.1 10*3/uL (ref 0.0–0.1)
Basophils Relative: 5 %
EOS ABS: 0 10*3/uL (ref 0.0–0.5)
Eosinophils Relative: 1 %
HCT: 31.9 % — ABNORMAL LOW (ref 34.8–46.6)
Hemoglobin: 10.5 g/dL — ABNORMAL LOW (ref 11.6–15.9)
LYMPHS PCT: 24 %
Lymphs Abs: 0.5 10*3/uL — ABNORMAL LOW (ref 0.9–3.3)
MCH: 37.5 pg — AB (ref 25.1–34.0)
MCHC: 32.9 g/dL (ref 31.5–36.0)
MCV: 113.9 fL — AB (ref 79.5–101.0)
MONO ABS: 0.2 10*3/uL (ref 0.1–0.9)
MONOS PCT: 11 %
Neutro Abs: 1.2 10*3/uL — ABNORMAL LOW (ref 1.5–6.5)
Neutrophils Relative %: 59 %
PLATELETS: 96 10*3/uL — AB (ref 145–400)
RBC: 2.8 MIL/uL — ABNORMAL LOW (ref 3.70–5.45)
RDW: 15.3 % — AB (ref 11.2–14.5)
WBC: 2.1 10*3/uL — AB (ref 3.9–10.3)

## 2018-03-14 LAB — CMP (CANCER CENTER ONLY)
ALT: 14 U/L (ref 0–55)
ANION GAP: 7 (ref 3–11)
AST: 30 U/L (ref 5–34)
Albumin: 3.6 g/dL (ref 3.5–5.0)
Alkaline Phosphatase: 76 U/L (ref 40–150)
BUN: 14 mg/dL (ref 7–26)
CHLORIDE: 108 mmol/L (ref 98–109)
CO2: 25 mmol/L (ref 22–29)
Calcium: 9.3 mg/dL (ref 8.4–10.4)
Creatinine: 1.24 mg/dL — ABNORMAL HIGH (ref 0.60–1.10)
GFR, EST AFRICAN AMERICAN: 48 mL/min — AB (ref >60)
GFR, EST NON AFRICAN AMERICAN: 41 mL/min — AB (ref >60)
Glucose, Bld: 99 mg/dL (ref 70–140)
Potassium: 3.8 mmol/L (ref 3.5–5.1)
SODIUM: 140 mmol/L (ref 136–145)
Total Bilirubin: 0.5 mg/dL (ref 0.2–1.2)
Total Protein: 7.9 g/dL (ref 6.4–8.3)

## 2018-03-14 MED ORDER — AMLODIPINE BESYLATE 10 MG PO TABS: 5.0000 mg | ORAL_TABLET | Freq: Every day | ORAL | 0 refills | Status: DC

## 2018-03-14 NOTE — Telephone Encounter (Signed)
Appointments scheduled AVS/Calendar printed per 5/14 los °

## 2018-03-14 NOTE — Telephone Encounter (Signed)
Copied from [place] [id number]. Topic: Quick Communication - Rx Refill/Question >> Mar 14, 2018  9:39 AM Robina Ade, Helene Kelp D wrote: Medication: amLODipine (NORVASC) 10 MG tablet,ALPRAZolam (XANAX) 0.5 MG tablet Has the patient contacted their pharmacy? No, it will be seen to a new pharmacy  (Agent: If no, request that the patient contact the pharmacy for the refill.) Preferred Pharmacy (with phone number or street name): [place], [place] - [id number]  Agent: Please be advised that RX refills may take up to 3 business days. We ask that you follow-up with your pharmacy.

## 2018-03-14 NOTE — Telephone Encounter (Signed)
LOV  12/20/17 Dr. Alain Marion Last refill 12/08/17  #30  0 refill

## 2018-03-15 ENCOUNTER — Encounter: Payer: Self-pay | Admitting: *Deleted

## 2018-03-15 LAB — CANCER ANTIGEN 27.29: CA 27.29: 131.5 U/mL — ABNORMAL HIGH (ref 0.0–38.6)

## 2018-03-15 NOTE — Progress Notes (Signed)
[facility] Work  Clinical Social Work was referred by Mikey Bussing, NP.  CSW contacted patient, patient stated she was unable to talk at time of call but agreed to call back tomorrow.  Maryjean Morn, MSW, LCSW, OSW-C Clinical Social Worker [facility] [phone]

## 2018-03-16 ENCOUNTER — Encounter: Payer: Self-pay | Admitting: *Deleted

## 2018-03-16 NOTE — Progress Notes (Signed)
[place] Work  Clinical Social Work received phone call from patient today to follow up on home care and transportation resources.  CSW reviewed home care options- patient stated her main concern is transporting her trash to the other end of the apartment complex and discussed difficulty with daily living activities due to cancer side effects.   Patient scheduled appointment with CSW following her next injection to apply for [employer] transportation.  Maryjean Morn, MSW, LCSW, OSW-C Clinical Social Worker [facility] [phone]

## 2018-03-21 MED ORDER — ALPRAZOLAM 0.5 MG PO TABS: 0.5000 mg | ORAL_TABLET | Freq: Two times a day (BID) | ORAL | 3 refills | Status: DC | PRN

## 2018-03-28 ENCOUNTER — Ambulatory Visit: Payer: Medicare Other

## 2018-03-28 ENCOUNTER — Other Ambulatory Visit: Payer: Medicare Other

## 2018-04-03 ENCOUNTER — Other Ambulatory Visit: Payer: Self-pay | Admitting: Internal Medicine

## 2018-04-03 DIAGNOSIS — C7951 Secondary malignant neoplasm of bone: Principal | ICD-10-CM

## 2018-04-03 DIAGNOSIS — C50911 Malignant neoplasm of unspecified site of right female breast: Secondary | ICD-10-CM

## 2018-04-04 MED FILL — ALPRAZolam 0.5 MG TABS: 0.5 | 15 days supply | Qty: 30 | Fill #0

## 2018-04-06 DIAGNOSIS — Z6834 Body mass index (BMI) 34.0-34.9, adult: Secondary | ICD-10-CM | POA: Diagnosis not present

## 2018-04-06 DIAGNOSIS — N393 Stress incontinence (female) (male): Secondary | ICD-10-CM | POA: Diagnosis not present

## 2018-04-06 DIAGNOSIS — N760 Acute vaginitis: Secondary | ICD-10-CM | POA: Diagnosis not present

## 2018-04-06 DIAGNOSIS — N898 Other specified noninflammatory disorders of vagina: Secondary | ICD-10-CM | POA: Diagnosis not present

## 2018-04-06 DIAGNOSIS — N762 Acute vulvitis: Secondary | ICD-10-CM | POA: Diagnosis not present

## 2018-04-11 ENCOUNTER — Inpatient Hospital Stay: Payer: Medicare HMO

## 2018-04-11 ENCOUNTER — Inpatient Hospital Stay: Payer: Medicare HMO | Admitting: Oncology

## 2018-04-13 ENCOUNTER — Ambulatory Visit: Payer: Medicare Other

## 2018-04-17 NOTE — Progress Notes (Signed)
[name] OFFICE PROGRESS NOTE  [name], [name], MD [place] [address]  DIAGNOSIS: Metastatic breast adenocarcinoma presented with bone metastasis in the left hip area this was initially diagnosed as a stage II (T2, N1, M0) invasive right breast carcinoma with positive estrogen and progesterone receptor as well as HER-2/neu in July 2002  PRIOR THERAPY: 1. Status post right lumpectomy with right axillary lymph node dissection revealing 4/9 lymph nodes that were positive for malignancy.  2. Status post 8 cycles of systemic chemotherapy with CMF, completed March 2003.  3. Status post radiotherapy to the remaining right breast under the care of Dr. Tammi Klippel.  4. Status post 2 years of treatment with tamoxifen, started July 2003, then switched to Femara by Dr. Humphrey Rolls in August 2005. The patient discontinued the treatment in February 2009.  5. Status post left total hip replacement on Apr 01, 2011, secondary to pathologic left femoral neck fracture. 6. Zometa 4 mg IV on 3 monthly basis. Discontinued secondary to renal insufficiency. 7. Arimidex 1 mg by mouth daily started 03/18/2011.  CURRENT THERAPY: 1. Arimidex 1 mg by mouth daily and Ibrance 125 mg by mouth daily for 21 days every 4 weeks. First dose started 04/25/2017. Leslee Home will be changed to 100 mg starting 08/22/2017. 2. Xgeva 120 g subcutaneously every 3 months.  INTERVAL HISTORY: [name] [name] 77 y.o. female returns for routine follow-up visit by herself.  The patient is feeling fine today and has no specific complaints.  The patient denies fevers and chills.  Denies chest pain, shortness of breath, cough, hemoptysis.  Denies nausea, vomiting, constipation, diarrhea.  She has ongoing back pain which is unchanged from previous reports.  She denies recent weight loss or night sweats.  The patient took her last dose of Ibrance about 5 days ago.  The patient is here for evaluation and repeat lab  work.  MEDICAL HISTORY: Past Medical History:  Diagnosis Date  . Anxiety    hx of  . Asthma   . Breast cancer ([place])    2002; metastatic in 2012, right breast, spread to left hip in 2012  . Cerebrovascular accident [facility])    R thalamic CVA 01/2010  . Chemotherapy induced neutropenia ([place]) 08/15/2017  . Dyslipidemia   . Encounter for therapeutic drug monitoring 04/14/2017  . Goals of care, counseling/discussion 04/19/2017  . Hyperlipemia   . Hypertension   . Low back pain   . Osteoarthritis   . TIA (transient ischemic attack)    x2 in 2011  . Vertigo 2014    ALLERGIES:  has No Known Allergies.  MEDICATIONS:  Current Outpatient Medications  Medication Sig Dispense Refill  . ALPRAZolam (XANAX) 0.5 MG tablet Take 1 tablet (0.5 mg total) by mouth 2 (two) times daily as needed for anxiety or sleep. 30 tablet 3  . amLODipine (NORVASC) 10 MG tablet Take 0.5 tablets (5 mg total) by mouth daily. 90 tablet 0  . anastrozole (ARIMIDEX) 1 MG tablet TAKE 1 TABLET BY MOUTH ONCE DAILY 90 tablet 2  . Cholecalciferol (VITAMIN D3) 1000 UNITS CAPS Take 1 capsule by mouth daily.     Marland Kitchen losartan-hydrochlorothiazide (HYZAAR) 100-25 MG tablet Take 1 tablet by mouth daily. 90 tablet 3  . meclizine (ANTIVERT) 12.5 MG tablet Take 1 tablet (12.5 mg total) by mouth 2 (two) times daily as needed for dizziness. 10 tablet 0  . palbociclib (IBRANCE) 100 MG capsule Take 1 capsule (100 mg total) by mouth daily with  breakfast. Take whole with food. 21 capsule 2  . tiZANidine (ZANAFLEX) 4 MG tablet Take 4 mg by mouth every 6 (six) hours as needed for muscle spasms.    Marland Kitchen nystatin (MYCOSTATIN/NYSTOP) powder Apply 1 Dose topically daily.  3  . spironolactone (ALDACTONE) 50 MG tablet Take 1 tablet (50 mg total) by mouth daily. 90 tablet 3   No current facility-administered medications for this visit.    Facility-Administered Medications Ordered in Other Visits  Medication Dose Route Frequency Provider Last Rate Last  Dose  . alteplase (CATHFLO ACTIVASE) injection 2 mg  2 mg Intracatheter Once PRN Curt Bears, MD      . denosumab (XGEVA) injection 120 mg  120 mg Subcutaneous Once Curt Bears, MD      . sodium chloride 0.9 % injection 10 mL  10 mL Intravenous PRN Curt Bears, MD   10 mL at 04/18/18 1600    SURGICAL HISTORY:  Past Surgical History:  Procedure Laterality Date  . ABDOMINAL HYSTERECTOMY     complete  . BREAST LUMPECTOMY     Right breast 2002  . JOINT REPLACEMENT  2012   L hip due to breast ca met  . TOTAL HIP ARTHROPLASTY Right 10/28/2015   Procedure: RIGHT TOTAL HIP ARTHROPLASTY ANTERIOR APPROACH;  Surgeon: Paralee Cancel, MD;  Location: [facility];  Service: Orthopedics;  Laterality: Right;    REVIEW OF SYSTEMS:   Review of Systems  Constitutional: Negative for appetite change, chills, fatigue, fever and unexpected weight change.  HENT:   Negative for mouth sores, nosebleeds, sore throat and trouble swallowing.   Eyes: Negative for eye problems and icterus.  Respiratory: Negative for cough, hemoptysis, shortness of breath and wheezing.   Cardiovascular: Negative for chest pain and leg swelling.  Gastrointestinal: Negative for abdominal pain, constipation, diarrhea, nausea and vomiting.  Genitourinary: Negative for bladder incontinence, difficulty urinating, dysuria, frequency and hematuria.   Musculoskeletal: Negative for gait problem, neck pain and neck stiffness. Positive for her baseline back pain. Skin: Negative for itching and rash.  Neurological: Negative for dizziness, extremity weakness, gait problem, headaches, light-headedness and seizures.  Hematological: Negative for adenopathy. Does not bruise/bleed easily.  Psychiatric/Behavioral: Negative for confusion, depression and sleep disturbance. The patient is not nervous/anxious.     PHYSICAL EXAMINATION:  Blood pressure (!) 152/63, pulse 89, temperature 98.5 F (36.9 C), temperature source Oral, resp. rate 18,  height 5' 3"  (1.6 m), weight 206 lb 4.8 oz (93.6 kg), SpO2 98 %.  ECOG PERFORMANCE STATUS: 1 - Symptomatic but completely ambulatory  Physical Exam  Constitutional: Oriented to person, place, and time and well-developed, well-nourished, and in no distress. No distress.  HENT:  Head: Normocephalic and atraumatic.  Mouth/Throat: Oropharynx is clear and moist. No oropharyngeal exudate.  Eyes: Conjunctivae are normal. Right eye exhibits no discharge. Left eye exhibits no discharge. No scleral icterus.  Neck: Normal range of motion. Neck supple.  Cardiovascular: Normal rate, regular rhythm, normal heart sounds and intact distal pulses.   Pulmonary/Chest: Effort normal and breath sounds normal. No respiratory distress. No wheezes. No rales.  Abdominal: Soft. Bowel sounds are normal. Exhibits no distension and no mass. There is no tenderness.  Musculoskeletal: Normal range of motion. Exhibits no edema.  Lymphadenopathy:    No cervical adenopathy.  Neurological: Alert and oriented to person, place, and time. Exhibits normal muscle tone. Gait normal. Coordination normal.  Skin: Skin is warm and dry. No rash noted. Not diaphoretic. No erythema. No pallor.  Psychiatric: Mood, memory and  judgment normal.  Vitals reviewed.  LABORATORY DATA: Lab Results  Component Value Date   WBC 2.4 (L) 04/18/2018   HGB 11.1 (L) 04/18/2018   HCT 33.4 (L) 04/18/2018   MCV 115.3 (H) 04/18/2018   PLT 175 04/18/2018      Chemistry      Component Value Date/Time   NA 140 04/18/2018 1447   NA 140 11/03/2017 1044   K 4.2 04/18/2018 1447   K 3.5 11/03/2017 1044   CL 106 04/18/2018 1447   CL 109 (H) 04/25/2013 1023   CO2 27 04/18/2018 1447   CO2 23 11/03/2017 1044   BUN 14 04/18/2018 1447   BUN 9.7 11/03/2017 1044   CREATININE 1.25 (H) 04/18/2018 1447   CREATININE 1.4 (H) 11/03/2017 1044      Component Value Date/Time   CALCIUM 9.3 04/18/2018 1447   CALCIUM 9.2 11/03/2017 1044   ALKPHOS 78 04/18/2018  1447   ALKPHOS 74 11/03/2017 1044   AST 17 04/18/2018 1447   AST 27 11/03/2017 1044   ALT 6 04/18/2018 1447   ALT 16 11/03/2017 1044   BILITOT 0.5 04/18/2018 1447   BILITOT 0.75 11/03/2017 1044       RADIOGRAPHIC STUDIES:  No results found.   ASSESSMENT/PLAN:  Breast cancer metastasized to bone, right [facility]) This is a very pleasant 77year old African-American female with metastatic breast carcinoma status post treatment with Arimidex 1 mg by mouth daily since May 2012 Unfortunately the patient developed disease progression with new multiple pulmonary nodules in addition to questionable metastatic disease to the T10 vertebra. The patient was started on treatment with Arimidex 1 mg by mouth daily and Ibrance 125 mg daily for 21 days every 4 weeks and has been tolerating this treatment well except for pancytopenia.Her dose of Ibrance waschangedto 100 mg p.o. daily for 21 days every 4 weeks in addition to Arimidexdue to pancytopenia. The patient has been tolerating the treatment fairly well. Labs have been reviewed and remain stable. Recommend that continue Ibrance 100 mg daily x21 days every 4 weeks. The patient will receive her Xgeva injection today. She will follow-up in approximately 1 month for evaluation and repeat lab work.  For hypertension, she will continue to follow-up with her primary care provider for management any medication adjustments.  She was advised to call immediately if she has any concerning symptoms in the interval. The patient voices understanding of current disease status and treatment options and is in agreement with the current care plan. All questions were answered. The patient knows to call the clinic with any problems, questions or concerns. We can certainly see the patient much sooner if necessary.   Orders Placed This Encounter  Procedures  . CBC with Differential (Cancer Center Only)    Standing Status:   Future    Standing Expiration  Date:   04/19/2019  . CMP ([facility] only)    Standing Status:   Future    Standing Expiration Date:   04/19/2019  . CA 27.29    Standing Status:   Future    Standing Expiration Date:   04/18/2019   Mikey Bussing, DNP, AGPCNP-BC, AOCNP 04/18/18

## 2018-04-17 NOTE — Assessment & Plan Note (Addendum)
This is a very pleasant 77year old African-American female with metastatic breast carcinoma status post treatment with Arimidex 1 mg by mouth daily since May 2012 Unfortunately the patient developed disease progression with new multiple pulmonary nodules in addition to questionable metastatic disease to the T10 vertebra. The patient was started on treatment with Arimidex 1 mg by mouth daily and Ibrance 125 mg daily for 21 days every 4 weeks and has been tolerating this treatment well except for pancytopenia.Her dose of Ibrance waschangedto 100 mg p.o. daily for 21 days every 4 weeks in addition to Arimidexdue to pancytopenia. The patient has been tolerating the treatment fairly well. Labs have been reviewed and remain stable. Recommend that continue Ibrance 100 mg daily x21 days every 4 weeks. The patient will receive her Xgeva injection today. She will follow-up in approximately 1 month for evaluation and repeat lab work.  For hypertension, she will continue to follow-up with her primary care provider for management any medication adjustments.  She was advised to call immediately if she has any concerning symptoms in the interval. The patient voices understanding of current disease status and treatment options and is in agreement with the current care plan. All questions were answered. The patient knows to call the clinic with any problems, questions or concerns. We can certainly see the patient much sooner if necessary.

## 2018-04-18 ENCOUNTER — Inpatient Hospital Stay: Payer: Medicare HMO

## 2018-04-18 ENCOUNTER — Inpatient Hospital Stay (HOSPITAL_BASED_OUTPATIENT_CLINIC_OR_DEPARTMENT_OTHER): Payer: Medicare HMO | Admitting: Oncology

## 2018-04-18 ENCOUNTER — Encounter: Payer: Self-pay | Admitting: Oncology

## 2018-04-18 ENCOUNTER — Telehealth: Payer: Self-pay | Admitting: Oncology

## 2018-04-18 ENCOUNTER — Inpatient Hospital Stay: Payer: Medicare HMO | Attending: Oncology

## 2018-04-18 VITALS — BP 152/63 | HR 89 | Temp 98.5°F | Resp 18 | Ht 63.0 in | Wt 206.3 lb

## 2018-04-18 DIAGNOSIS — Z5181 Encounter for therapeutic drug level monitoring: Secondary | ICD-10-CM

## 2018-04-18 DIAGNOSIS — Z9221 Personal history of antineoplastic chemotherapy: Secondary | ICD-10-CM

## 2018-04-18 DIAGNOSIS — Z8673 Personal history of transient ischemic attack (TIA), and cerebral infarction without residual deficits: Secondary | ICD-10-CM | POA: Insufficient documentation

## 2018-04-18 DIAGNOSIS — E785 Hyperlipidemia, unspecified: Secondary | ICD-10-CM | POA: Insufficient documentation

## 2018-04-18 DIAGNOSIS — M1990 Unspecified osteoarthritis, unspecified site: Secondary | ICD-10-CM

## 2018-04-18 DIAGNOSIS — Z79899 Other long term (current) drug therapy: Secondary | ICD-10-CM | POA: Diagnosis not present

## 2018-04-18 DIAGNOSIS — F419 Anxiety disorder, unspecified: Secondary | ICD-10-CM | POA: Insufficient documentation

## 2018-04-18 DIAGNOSIS — J45909 Unspecified asthma, uncomplicated: Secondary | ICD-10-CM

## 2018-04-18 DIAGNOSIS — Z95828 Presence of other vascular implants and grafts: Secondary | ICD-10-CM

## 2018-04-18 DIAGNOSIS — C50911 Malignant neoplasm of unspecified site of right female breast: Secondary | ICD-10-CM

## 2018-04-18 DIAGNOSIS — Z79811 Long term (current) use of aromatase inhibitors: Secondary | ICD-10-CM | POA: Diagnosis not present

## 2018-04-18 DIAGNOSIS — C7951 Secondary malignant neoplasm of bone: Secondary | ICD-10-CM | POA: Insufficient documentation

## 2018-04-18 DIAGNOSIS — Z170 Estrogen receptor positive status [ER+]: Secondary | ICD-10-CM

## 2018-04-18 DIAGNOSIS — I10 Essential (primary) hypertension: Secondary | ICD-10-CM | POA: Diagnosis not present

## 2018-04-18 LAB — CBC WITH DIFFERENTIAL (CANCER CENTER ONLY)
Basophils Absolute: 0.1 10*3/uL (ref 0.0–0.1)
Basophils Relative: 2 %
Eosinophils Absolute: 0 10*3/uL (ref 0.0–0.5)
Eosinophils Relative: 1 %
HEMATOCRIT: 33.4 % — AB (ref 34.8–46.6)
HEMOGLOBIN: 11.1 g/dL — AB (ref 11.6–15.9)
LYMPHS ABS: 0.6 10*3/uL — AB (ref 0.9–3.3)
Lymphocytes Relative: 24 %
MCH: 38.4 pg — AB (ref 25.1–34.0)
MCHC: 33.3 g/dL (ref 31.5–36.0)
MCV: 115.3 fL — AB (ref 79.5–101.0)
MONO ABS: 0.3 10*3/uL (ref 0.1–0.9)
MONOS PCT: 13 %
Neutro Abs: 1.4 10*3/uL — ABNORMAL LOW (ref 1.5–6.5)
Neutrophils Relative %: 60 %
Platelet Count: 175 10*3/uL (ref 145–400)
RBC: 2.9 MIL/uL — ABNORMAL LOW (ref 3.70–5.45)
RDW: 16.4 % — AB (ref 11.2–14.5)
WBC Count: 2.4 10*3/uL — ABNORMAL LOW (ref 3.9–10.3)

## 2018-04-18 LAB — CMP (CANCER CENTER ONLY)
ALT: 6 U/L (ref 0–55)
ANION GAP: 7 (ref 3–11)
AST: 17 U/L (ref 5–34)
Albumin: 3.6 g/dL (ref 3.5–5.0)
Alkaline Phosphatase: 78 U/L (ref 40–150)
BILIRUBIN TOTAL: 0.5 mg/dL (ref 0.2–1.2)
BUN: 14 mg/dL (ref 7–26)
CALCIUM: 9.3 mg/dL (ref 8.4–10.4)
CO2: 27 mmol/L (ref 22–29)
Chloride: 106 mmol/L (ref 98–109)
Creatinine: 1.25 mg/dL — ABNORMAL HIGH (ref 0.60–1.10)
GFR, EST AFRICAN AMERICAN: 48 mL/min — AB (ref >60)
GFR, EST NON AFRICAN AMERICAN: 41 mL/min — AB (ref >60)
GLUCOSE: 98 mg/dL (ref 70–140)
Potassium: 4.2 mmol/L (ref 3.5–5.1)
Sodium: 140 mmol/L (ref 136–145)
TOTAL PROTEIN: 7.9 g/dL (ref 6.4–8.3)

## 2018-04-18 MED ORDER — DENOSUMAB 120 MG/1.7ML ~~LOC~~ SOLN
120.0000 mg | Freq: Once | SUBCUTANEOUS | Status: AC
  Administered 2018-04-18: 120 mg via SUBCUTANEOUS

## 2018-04-18 MED ORDER — PALBOCICLIB 100 MG PO CAPS: 100.0000 mg | ORAL_CAPSULE | Freq: Every day | ORAL | 2 refills | Status: DC

## 2018-04-18 MED ORDER — SODIUM CHLORIDE 0.9 % IJ SOLN
10.0000 mL | INTRAMUSCULAR | Status: DC | PRN
  Administered 2018-04-18: 10 mL via INTRAVENOUS
  Filled 2018-04-18: qty 10

## 2018-04-18 MED ORDER — HEPARIN SOD (PORK) LOCK FLUSH 100 UNIT/ML IV SOLN
500.0000 [IU] | Freq: Once | INTRAVENOUS | Status: AC | PRN
  Administered 2018-04-18: 500 [IU] via INTRAVENOUS
  Filled 2018-04-18: qty 5

## 2018-04-18 NOTE — Telephone Encounter (Signed)
Scheduled appt per 6/18 los - gave patient aVS and calender per los.

## 2018-04-19 ENCOUNTER — Encounter: Payer: Self-pay | Admitting: Internal Medicine

## 2018-04-19 ENCOUNTER — Ambulatory Visit (INDEPENDENT_AMBULATORY_CARE_PROVIDER_SITE_OTHER): Payer: Medicare HMO | Admitting: Internal Medicine

## 2018-04-19 DIAGNOSIS — I10 Essential (primary) hypertension: Secondary | ICD-10-CM | POA: Diagnosis not present

## 2018-04-19 DIAGNOSIS — C50911 Malignant neoplasm of unspecified site of right female breast: Secondary | ICD-10-CM | POA: Diagnosis not present

## 2018-04-19 DIAGNOSIS — E1329 Other specified diabetes mellitus with other diabetic kidney complication: Secondary | ICD-10-CM | POA: Diagnosis not present

## 2018-04-19 DIAGNOSIS — C7951 Secondary malignant neoplasm of bone: Secondary | ICD-10-CM

## 2018-04-19 DIAGNOSIS — L600 Ingrowing nail: Secondary | ICD-10-CM | POA: Insufficient documentation

## 2018-04-19 LAB — CANCER ANTIGEN 27.29: CA 27.29: 144.6 U/mL — ABNORMAL HIGH (ref 0.0–38.6)

## 2018-04-19 MED FILL — IBRANCE 100 MG CAPSULE: 100 | 28 days supply | Qty: 21 | Fill #0

## 2018-04-19 MED FILL — ALPRAZolam 0.5 MG TABS: 0.5 | 15 days supply | Qty: 30 | Fill #1

## 2018-04-19 NOTE — Progress Notes (Signed)
Subjective:  Patient ID: [name] [name], female    DOB: 07/28/1941  Age: 77 y.o. MRN: [id number]  CC: No chief complaint on file.   HPI [name] presents for R big toe - ingrown toenail pain, R fingers are numb F/u anxiety, HTN, breast ca f/u  Outpatient Medications Prior to Visit  Medication Sig Dispense Refill  . ALPRAZolam (XANAX) 0.5 MG tablet Take 1 tablet (0.5 mg total) by mouth 2 (two) times daily as needed for anxiety or sleep. 30 tablet 3  . amLODipine (NORVASC) 10 MG tablet Take 0.5 tablets (5 mg total) by mouth daily. 90 tablet 0  . anastrozole (ARIMIDEX) 1 MG tablet TAKE 1 TABLET BY MOUTH ONCE DAILY 90 tablet 2  . Cholecalciferol (VITAMIN D3) 1000 UNITS CAPS Take 1 capsule by mouth daily.     Marland Kitchen losartan-hydrochlorothiazide (HYZAAR) 100-25 MG tablet Take 1 tablet by mouth daily. 90 tablet 3  . meclizine (ANTIVERT) 12.5 MG tablet Take 1 tablet (12.5 mg total) by mouth 2 (two) times daily as needed for dizziness. 10 tablet 0  . nystatin (MYCOSTATIN/NYSTOP) powder Apply 1 Dose topically daily.  3  . palbociclib (IBRANCE) 100 MG capsule Take 1 capsule (100 mg total) by mouth daily with breakfast. Take whole with food. 21 capsule 2  . tiZANidine (ZANAFLEX) 4 MG tablet Take 4 mg by mouth every 6 (six) hours as needed for muscle spasms.    Marland Kitchen spironolactone (ALDACTONE) 50 MG tablet Take 1 tablet (50 mg total) by mouth daily. 90 tablet 3   Facility-Administered Medications Prior to Visit  Medication Dose Route Frequency Provider Last Rate Last Dose  . alteplase (CATHFLO ACTIVASE) injection 2 mg  2 mg Intracatheter Once PRN Curt Bears, MD      . denosumab (XGEVA) injection 120 mg  120 mg Subcutaneous Once Curt Bears, MD        ROS: Review of Systems  Constitutional: Positive for fatigue. Negative for activity change, appetite change, chills and unexpected weight change.  HENT: Negative for congestion, mouth sores and sinus pressure.   Eyes: Negative for  visual disturbance.  Respiratory: Negative for cough and chest tightness.   Gastrointestinal: Negative for abdominal pain and nausea.  Genitourinary: Negative for difficulty urinating, frequency and vaginal pain.  Musculoskeletal: Positive for back pain and gait problem.  Skin: Negative for pallor and rash.  Neurological: Positive for numbness. Negative for dizziness, tremors, weakness and headaches.  Psychiatric/Behavioral: Negative for confusion and sleep disturbance.    Objective:  BP 140/72 (BP Location: Left Arm, Patient Position: Sitting, Cuff Size: Large)   Pulse 92   Temp 97.6 F (36.4 C) (Oral)   Ht 5\' 3"  (1.6 m)   Wt 204 lb (92.5 kg)   SpO2 97%   BMI 36.14 kg/m   BP Readings from Last 3 Encounters:  04/19/18 140/72  04/18/18 (!) 152/63  03/14/18 (!) 157/72    Wt Readings from Last 3 Encounters:  04/19/18 204 lb (92.5 kg)  04/18/18 206 lb 4.8 oz (93.6 kg)  03/14/18 197 lb (89.4 kg)    Physical Exam  Constitutional: She appears well-developed. No distress.  HENT:  Head: Normocephalic.  Right Ear: External ear normal.  Left Ear: External ear normal.  Nose: Nose normal.  Mouth/Throat: Oropharynx is clear and moist.  Eyes: Pupils are equal, round, and reactive to light. Conjunctivae are normal. Right eye exhibits no discharge. Left eye exhibits no discharge.  Neck: Normal range of motion. Neck supple. No JVD present.  No tracheal deviation present. No thyromegaly present.  Cardiovascular: Normal rate, regular rhythm and normal heart sounds.  Pulmonary/Chest: No stridor. No respiratory distress. She has no wheezes.  Abdominal: Soft. Bowel sounds are normal. She exhibits no distension and no mass. There is no tenderness. There is no rebound and no guarding.  Musculoskeletal: She exhibits tenderness. She exhibits no edema.  Lymphadenopathy:    She has no cervical adenopathy.  Neurological: She displays normal reflexes. No cranial nerve deficit. She exhibits normal  muscle tone. Coordination normal.  Skin: No rash noted. No erythema.  Psychiatric: She has a normal mood and affect. Her behavior is normal. Judgment and thought content normal.  R big toe - ingrown toenail   Nm Bone Scan Whole Body  Result Date: 02/24/2018 CLINICAL DATA:  History of breast cancer with bone metastases. EXAM: NUCLEAR MEDICINE WHOLE BODY BONE SCAN TECHNIQUE: Whole body anterior and posterior images were obtained approximately 3 hours after intravenous injection of radiopharmaceutical. RADIOPHARMACEUTICALS:  20.4 mCi Technetium-17m MDP IV COMPARISON:  Whole-body bone scan 04/15/2017 and 12/29/2015. CT chest, abdomen and pelvis 02/06/2018. FINDINGS: Multifocal and lumbar spine is again seen uptake in the thoracic. Increased uptake in the T10 vertebral body appears increased in extent and intensity since the most recent bone scan. There is also new focus of increased uptake in the upper sacrum. Osseous uptake otherwise appears unchanged. Normal soft tissue uptake is identified. IMPRESSION: Some progression of disease with increase in the extent and intensity of uptake in T10 and a new focus of increased uptake in the sacrum. Electronically Signed   By: Inge Rise M.D.   On: 02/24/2018 15:10    Assessment & Plan:   There are no diagnoses linked to this encounter.   No orders of the defined types were placed in this encounter.    Follow-up: No follow-ups on file.  Walker Kehr, MD

## 2018-04-19 NOTE — Assessment & Plan Note (Signed)
Per Oncology.   ?

## 2018-04-19 NOTE — Assessment & Plan Note (Signed)
Labs

## 2018-04-19 NOTE — Assessment & Plan Note (Signed)
Hyzaar, Amlodipine

## 2018-04-19 NOTE — Assessment & Plan Note (Signed)
R big toe - ingrown toenail Ref to Dr March Rummage

## 2018-05-16 ENCOUNTER — Encounter: Payer: Self-pay | Admitting: Internal Medicine

## 2018-05-16 ENCOUNTER — Inpatient Hospital Stay: Payer: Medicare HMO

## 2018-05-16 ENCOUNTER — Inpatient Hospital Stay: Payer: Medicare HMO | Attending: Oncology | Admitting: Internal Medicine

## 2018-05-16 ENCOUNTER — Telehealth: Payer: Self-pay | Admitting: Internal Medicine

## 2018-05-16 VITALS — BP 142/61 | HR 80 | Temp 98.1°F | Resp 18 | Ht 63.0 in | Wt 204.9 lb

## 2018-05-16 DIAGNOSIS — I10 Essential (primary) hypertension: Secondary | ICD-10-CM

## 2018-05-16 DIAGNOSIS — F419 Anxiety disorder, unspecified: Secondary | ICD-10-CM | POA: Insufficient documentation

## 2018-05-16 DIAGNOSIS — C7951 Secondary malignant neoplasm of bone: Secondary | ICD-10-CM | POA: Insufficient documentation

## 2018-05-16 DIAGNOSIS — C773 Secondary and unspecified malignant neoplasm of axilla and upper limb lymph nodes: Secondary | ICD-10-CM | POA: Insufficient documentation

## 2018-05-16 DIAGNOSIS — D701 Agranulocytosis secondary to cancer chemotherapy: Secondary | ICD-10-CM

## 2018-05-16 DIAGNOSIS — J45909 Unspecified asthma, uncomplicated: Secondary | ICD-10-CM | POA: Insufficient documentation

## 2018-05-16 DIAGNOSIS — M1990 Unspecified osteoarthritis, unspecified site: Secondary | ICD-10-CM | POA: Insufficient documentation

## 2018-05-16 DIAGNOSIS — Z170 Estrogen receptor positive status [ER+]: Secondary | ICD-10-CM | POA: Insufficient documentation

## 2018-05-16 DIAGNOSIS — C50911 Malignant neoplasm of unspecified site of right female breast: Secondary | ICD-10-CM

## 2018-05-16 DIAGNOSIS — Z79899 Other long term (current) drug therapy: Secondary | ICD-10-CM

## 2018-05-16 DIAGNOSIS — K449 Diaphragmatic hernia without obstruction or gangrene: Secondary | ICD-10-CM | POA: Diagnosis not present

## 2018-05-16 DIAGNOSIS — Z79810 Long term (current) use of selective estrogen receptor modulators (SERMs): Secondary | ICD-10-CM | POA: Insufficient documentation

## 2018-05-16 DIAGNOSIS — E785 Hyperlipidemia, unspecified: Secondary | ICD-10-CM

## 2018-05-16 DIAGNOSIS — M159 Polyosteoarthritis, unspecified: Secondary | ICD-10-CM

## 2018-05-16 DIAGNOSIS — M150 Primary generalized (osteo)arthritis: Secondary | ICD-10-CM

## 2018-05-16 DIAGNOSIS — Z5181 Encounter for therapeutic drug level monitoring: Secondary | ICD-10-CM

## 2018-05-16 DIAGNOSIS — Z8673 Personal history of transient ischemic attack (TIA), and cerebral infarction without residual deficits: Secondary | ICD-10-CM | POA: Diagnosis not present

## 2018-05-16 DIAGNOSIS — T451X5A Adverse effect of antineoplastic and immunosuppressive drugs, initial encounter: Secondary | ICD-10-CM

## 2018-05-16 LAB — CBC WITH DIFFERENTIAL (CANCER CENTER ONLY)
BASOS ABS: 0 10*3/uL (ref 0.0–0.1)
BASOS PCT: 3 %
Eosinophils Absolute: 0 10*3/uL (ref 0.0–0.5)
Eosinophils Relative: 1 %
HEMATOCRIT: 32.8 % — AB (ref 34.8–46.6)
HEMOGLOBIN: 11.1 g/dL — AB (ref 11.6–15.9)
LYMPHS PCT: 21 %
Lymphs Abs: 0.3 10*3/uL — ABNORMAL LOW (ref 0.9–3.3)
MCH: 38.2 pg — ABNORMAL HIGH (ref 25.1–34.0)
MCHC: 33.8 g/dL (ref 31.5–36.0)
MCV: 113.2 fL — AB (ref 79.5–101.0)
MONO ABS: 0.2 10*3/uL (ref 0.1–0.9)
Monocytes Relative: 14 %
NEUTROS ABS: 0.9 10*3/uL — AB (ref 1.5–6.5)
NEUTROS PCT: 61 %
Platelet Count: 135 10*3/uL — ABNORMAL LOW (ref 145–400)
RBC: 2.9 MIL/uL — AB (ref 3.70–5.45)
RDW: 16.2 % — AB (ref 11.2–14.5)
WBC Count: 1.5 10*3/uL — ABNORMAL LOW (ref 3.9–10.3)

## 2018-05-16 LAB — CMP (CANCER CENTER ONLY)
ALBUMIN: 3.7 g/dL (ref 3.5–5.0)
ALK PHOS: 78 U/L (ref 38–126)
ALT: 9 U/L (ref 0–44)
ANION GAP: 9 (ref 5–15)
AST: 19 U/L (ref 15–41)
BUN: 11 mg/dL (ref 8–23)
CALCIUM: 9.1 mg/dL (ref 8.9–10.3)
CO2: 25 mmol/L (ref 22–32)
Chloride: 106 mmol/L (ref 98–111)
Creatinine: 1.25 mg/dL — ABNORMAL HIGH (ref 0.44–1.00)
GFR, Est AFR Am: 48 mL/min — ABNORMAL LOW (ref >60)
GFR, Estimated: 41 mL/min — ABNORMAL LOW (ref >60)
GLUCOSE: 98 mg/dL (ref 70–99)
Potassium: 3.6 mmol/L (ref 3.5–5.1)
Sodium: 140 mmol/L (ref 135–145)
Total Bilirubin: 0.7 mg/dL (ref 0.3–1.2)
Total Protein: 8.1 g/dL (ref 6.5–8.1)

## 2018-05-16 NOTE — Telephone Encounter (Signed)
Scheduled appt per 7/16 los - gave patient AVS and calender per los.  

## 2018-05-16 NOTE — Progress Notes (Signed)
[facility] [phone]   [phone]  OFFICE PROGRESS NOTE  [name], [name], MD [place] [id number]  DIAGNOSIS: Metastatic breast adenocarcinoma presented with bone metastasis in the left hip area this was initially diagnosed as a stage II (T2, N1, M0) invasive right breast carcinoma with positive estrogen and progesterone receptor as well as HER-2/neu in July 2002  PRIOR THERAPY: 1. Status post right lumpectomy with right axillary lymph node dissection revealing 4/9 lymph nodes that were positive for malignancy.  2. Status post 8 cycles of systemic chemotherapy with CMF, completed March 2003.  3. Status post radiotherapy to the remaining right breast under the care of Dr. Tammi Klippel.  4. Status post 2 years of treatment with tamoxifen, started July 2003, then switched to Femara by Dr. Humphrey Rolls in August 2005. The patient discontinued the treatment in February 2009.  5. Status post left total hip replacement on Apr 01, 2011, secondary to pathologic left femoral neck fracture. 6. Zometa 4 mg IV on 3 monthly basis. Discontinued secondary to renal insufficiency. 7.  Arimidex 1 mg by mouth daily started 03/18/2011.   CURRENT THERAPY: 1. Arimidex 1 mg by mouth daily and Ibrance 125 mg by mouth daily for 21 days every 4 weeks. First dose started 04/25/2017. Leslee Home will be changed to 100 mg starting 08/22/2017. 2. Xgeva 120 g subcutaneously every 3 months.  INTERVAL HISTORY: Constancia Geeting Mac 77 y.o. female returns to the clinic today for follow-up visit.  The patient is complaining of increasing fatigue and weakness.  She also has numbness in her left hand up to the elbow started 2 weeks ago.  She also feels cold all the time but no fever.  She has shortness of breath with exertion and aching pain on the back.  She has been tolerating her treatment with Arimidex and Ibrance fairly well except for the neutropenia.  She has no nausea, vomiting,  diarrhea or constipation.  She denied having any chest pain, cough or hemoptysis.  She has no headache or visual changes.  The patient is here today for evaluation and repeat blood work.  MEDICAL HISTORY: Past Medical History:  Diagnosis Date  . Anxiety    hx of  . Asthma   . Breast cancer ([place])    2002; metastatic in 2012, right breast, spread to left hip in 2012  . Cerebrovascular accident [facility])    R thalamic CVA 01/2010  . Chemotherapy induced neutropenia ([place]) 08/15/2017  . Dyslipidemia   . Encounter for therapeutic drug monitoring 04/14/2017  . Goals of care, counseling/discussion 04/19/2017  . Hyperlipemia   . Hypertension   . Low back pain   . Osteoarthritis   . TIA (transient ischemic attack)    x2 in 2011  . Vertigo 2014    ALLERGIES:  has No Known Allergies.  MEDICATIONS:  Current Outpatient Medications  Medication Sig Dispense Refill  . ALPRAZolam (XANAX) 0.5 MG tablet Take 1 tablet (0.5 mg total) by mouth 2 (two) times daily as needed for anxiety or sleep. 30 tablet 3  . amLODipine (NORVASC) 10 MG tablet Take 0.5 tablets (5 mg total) by mouth daily. 90 tablet 0  . anastrozole (ARIMIDEX) 1 MG tablet TAKE 1 TABLET BY MOUTH ONCE DAILY 90 tablet 2  . Cholecalciferol (VITAMIN D3) 1000 UNITS CAPS Take 1 capsule by mouth daily.     Marland Kitchen losartan-hydrochlorothiazide (HYZAAR) 100-25 MG tablet Take 1 tablet by mouth daily. 90 tablet 3  .  meclizine (ANTIVERT) 12.5 MG tablet Take 1 tablet (12.5 mg total) by mouth 2 (two) times daily as needed for dizziness. 10 tablet 0  . nystatin (MYCOSTATIN/NYSTOP) powder Apply 1 Dose topically daily.  3  . palbociclib (IBRANCE) 100 MG capsule Take 1 capsule (100 mg total) by mouth daily with breakfast. Take whole with food. 21 capsule 2  . spironolactone (ALDACTONE) 50 MG tablet Take 1 tablet (50 mg total) by mouth daily. 90 tablet 3  . tiZANidine (ZANAFLEX) 4 MG tablet Take 4 mg by mouth every 6 (six) hours as needed for muscle spasms.     No  current facility-administered medications for this visit.    Facility-Administered Medications Ordered in Other Visits  Medication Dose Route Frequency Provider Last Rate Last Dose  . alteplase (CATHFLO ACTIVASE) injection 2 mg  2 mg Intracatheter Once PRN Curt Bears, MD      . denosumab (XGEVA) injection 120 mg  120 mg Subcutaneous Once Curt Bears, MD        SURGICAL HISTORY:  Past Surgical History:  Procedure Laterality Date  . ABDOMINAL HYSTERECTOMY     complete  . BREAST LUMPECTOMY     Right breast 2002  . JOINT REPLACEMENT  2012   L hip due to breast ca met  . TOTAL HIP ARTHROPLASTY Right 10/28/2015   Procedure: RIGHT TOTAL HIP ARTHROPLASTY ANTERIOR APPROACH;  Surgeon: Paralee Cancel, MD;  Location: [facility];  Service: Orthopedics;  Laterality: Right;    REVIEW OF SYSTEMS:  Constitutional: positive for chills and fatigue Eyes: negative Ears, nose, mouth, throat, and face: negative Respiratory: positive for dyspnea on exertion Cardiovascular: negative Gastrointestinal: negative Genitourinary:negative Integument/breast: negative Hematologic/lymphatic: negative Musculoskeletal:positive for arthralgias and back pain Neurological: positive for paresthesia Behavioral/Psych: negative Endocrine: negative Allergic/Immunologic: negative   PHYSICAL EXAMINATION: General appearance: alert, cooperative, fatigued and no distress Head: Normocephalic, without obvious abnormality, atraumatic Neck: no adenopathy, no JVD, supple, symmetrical, trachea midline and thyroid not enlarged, symmetric, no tenderness/mass/nodules Lymph nodes: Cervical, supraclavicular, and axillary nodes normal. Resp: clear to auscultation bilaterally Back: symmetric, no curvature. ROM normal. No CVA tenderness. Cardio: regular rate and rhythm, S1, S2 normal, no murmur, click, rub or gallop GI: soft, non-tender; bowel sounds normal; no masses,  no organomegaly Extremities: extremities normal, atraumatic,  no cyanosis or edema Neurologic: Alert and oriented X 3, normal strength and tone. Normal symmetric reflexes. Normal coordination and gait  ECOG PERFORMANCE STATUS: 1 - Symptomatic but completely ambulatory  Blood pressure (!) 142/61, pulse 80, temperature 98.1 F (36.7 C), temperature source Oral, resp. rate 18, height '5\' 3"'$  (1.6 m), weight 204 lb 14.4 oz (92.9 kg), SpO2 100 %.  LABORATORY DATA: Lab Results  Component Value Date   WBC 1.5 (L) 05/16/2018   HGB 11.1 (L) 05/16/2018   HCT 32.8 (L) 05/16/2018   MCV 113.2 (H) 05/16/2018   PLT 135 (L) 05/16/2018      Chemistry      Component Value Date/Time   NA 140 05/16/2018 0917   NA 140 11/03/2017 1044   K 3.6 05/16/2018 0917   K 3.5 11/03/2017 1044   CL 106 05/16/2018 0917   CL 109 (H) 04/25/2013 1023   CO2 25 05/16/2018 0917   CO2 23 11/03/2017 1044   BUN 11 05/16/2018 0917   BUN 9.7 11/03/2017 1044   CREATININE 1.25 (H) 05/16/2018 0917   CREATININE 1.4 (H) 11/03/2017 1044      Component Value Date/Time   CALCIUM 9.1 05/16/2018 0917   CALCIUM 9.2  11/03/2017 1044   ALKPHOS 78 05/16/2018 0917   ALKPHOS 74 11/03/2017 1044   AST 19 05/16/2018 0917   AST 27 11/03/2017 1044   ALT 9 05/16/2018 0917   ALT 16 11/03/2017 1044   BILITOT 0.7 05/16/2018 0917   BILITOT 0.75 11/03/2017 1044       RADIOGRAPHIC STUDIES: No results found.  ASSESSMENT AND PLAN:  This is a very pleasant 77 years old African-American female with metastatic breast carcinoma status post treatment with Arimidex 1 mg by mouth daily since May 2012 Unfortunately the patient developed disease progression with new multiple pulmonary nodules in addition to questionable metastatic disease to the T10 vertebra. The patient was started on treatment with Arimidex 1 mg by mouth daily and Ibrance 125 mg daily for 21 days every 4 weeks and has been tolerating this treatment well except for pancytopenia.  Her dose of Ibrance was a change it to 100 mg p.o. daily for  21 days every 4 weeks in addition to Arimidex. She continues to tolerate this treatment well except for neutropenia. Her blood work showed persistent increase in CA 27.29 over the last few months. I recommended for the patient to have repeat CT scan of the chest, abdomen and pelvis as well as bone scan for evaluation of her disease. I recommended for her to hold her current treatment with Arimidex and Ibrance for now until we have the scan results. If this scan showed evidence for disease progression, I may change her treatment to Faslodex and reduce her Ibrance dose to 75 mg p.o. daily because of the drug-induced neutropenia. For the bone disease she will continue her current treatment with Xgeva. The patient will come back for follow-up visit in 2 weeks for reevaluation and more detailed discussion of her treatment options based on the scan results. She was advised to call immediately if she has any concerning symptoms in the interval. The patient voices understanding of current disease status and treatment options and is in agreement with the current care plan. All questions were answered. The patient knows to call the clinic with any problems, questions or concerns. We can certainly see the patient much sooner if necessary.  Disclaimer: This note was dictated with voice recognition software. Similar sounding words can inadvertently be transcribed and may not be corrected upon review.

## 2018-05-17 LAB — CANCER ANTIGEN 27.29: CAN 27.29: 155.2 U/mL — AB (ref 0.0–38.6)

## 2018-05-23 ENCOUNTER — Ambulatory Visit (HOSPITAL_COMMUNITY)
Admission: RE | Admit: 2018-05-23 | Discharge: 2018-05-23 | Disposition: A | Discharge status: Home / Self Care | Payer: Medicare HMO | Source: Ambulatory Visit | Attending: Internal Medicine | Admitting: Internal Medicine

## 2018-05-23 ENCOUNTER — Encounter (HOSPITAL_COMMUNITY)
Admission: RE | Admit: 2018-05-23 | Discharge: 2018-05-23 | Disposition: A | Discharge status: Home / Self Care | Payer: Medicare HMO | Source: Ambulatory Visit | Attending: Internal Medicine | Admitting: Internal Medicine

## 2018-05-23 DIAGNOSIS — R918 Other nonspecific abnormal finding of lung field: Secondary | ICD-10-CM | POA: Insufficient documentation

## 2018-05-23 DIAGNOSIS — C7951 Secondary malignant neoplasm of bone: Secondary | ICD-10-CM | POA: Diagnosis not present

## 2018-05-23 DIAGNOSIS — C50911 Malignant neoplasm of unspecified site of right female breast: Secondary | ICD-10-CM

## 2018-05-23 DIAGNOSIS — C50919 Malignant neoplasm of unspecified site of unspecified female breast: Secondary | ICD-10-CM | POA: Diagnosis not present

## 2018-05-23 DIAGNOSIS — K7689 Other specified diseases of liver: Secondary | ICD-10-CM | POA: Diagnosis not present

## 2018-05-23 DIAGNOSIS — R160 Hepatomegaly, not elsewhere classified: Secondary | ICD-10-CM | POA: Diagnosis not present

## 2018-05-23 MED ORDER — IOPAMIDOL (ISOVUE-300) INJECTION 61%
INTRAVENOUS | Status: AC
  Filled 2018-05-23: qty 100

## 2018-05-23 MED ORDER — TECHNETIUM TC 99M MEDRONATE IV KIT
20.0000 | PACK | Freq: Once | INTRAVENOUS | Status: AC | PRN
  Administered 2018-05-23: 20 via INTRAVENOUS

## 2018-05-23 MED ORDER — IOPAMIDOL (ISOVUE-300) INJECTION 61%
30.0000 mL | Freq: Once | INTRAVENOUS | Status: AC | PRN
  Administered 2018-05-23: 30 mL via ORAL

## 2018-05-23 MED ORDER — IOPAMIDOL (ISOVUE-300) INJECTION 61%
INTRAVENOUS | Status: AC
  Filled 2018-05-23: qty 30

## 2018-05-23 MED ORDER — TECHNETIUM TC 99M MEDRONATE IV KIT
21.0000 | PACK | Freq: Once | INTRAVENOUS | Status: AC
  Administered 2018-05-23: 21 via INTRAVENOUS

## 2018-05-23 MED ORDER — IOPAMIDOL (ISOVUE-300) INJECTION 61%
100.0000 mL | Freq: Once | INTRAVENOUS | Status: AC | PRN
  Administered 2018-05-23: 100 mL via INTRAVENOUS

## 2018-05-24 ENCOUNTER — Telehealth: Payer: Self-pay | Admitting: Internal Medicine

## 2018-05-24 MED ORDER — AMLODIPINE BESYLATE 10 MG PO TABS: 5.0000 mg | ORAL_TABLET | Freq: Every day | ORAL | 1 refills | Status: DC

## 2018-05-24 NOTE — Telephone Encounter (Signed)
Copied from [place] [id number]. Topic: Quick Communication - Rx Refill/Question >> May 24, 2018 10:49 AM Alfredia Ferguson R wrote: Medication: amLODipine (NORVASC) 10 MG tablet  Has the patient contacted their pharmacy? Yes  Preferred Pharmacy (with phone number or street name): [id number] [address], [place]. [phone] (Phone) [phone] (Fax)     Pt accidentally threw meds in trash

## 2018-05-24 NOTE — Telephone Encounter (Signed)
Reviewed chart pt is up-to-date sent refills to [facility].../lmb  

## 2018-05-25 ENCOUNTER — Ambulatory Visit: Payer: Self-pay | Admitting: Podiatry

## 2018-05-30 ENCOUNTER — Telehealth: Payer: Self-pay | Admitting: Internal Medicine

## 2018-05-30 ENCOUNTER — Encounter: Payer: Self-pay | Admitting: Internal Medicine

## 2018-05-30 ENCOUNTER — Inpatient Hospital Stay: Payer: Medicare HMO

## 2018-05-30 ENCOUNTER — Inpatient Hospital Stay (HOSPITAL_BASED_OUTPATIENT_CLINIC_OR_DEPARTMENT_OTHER): Payer: Medicare HMO | Admitting: Internal Medicine

## 2018-05-30 VITALS — BP 155/65 | HR 78 | Temp 98.0°F | Resp 20 | Ht 63.0 in | Wt 203.6 lb

## 2018-05-30 DIAGNOSIS — J45909 Unspecified asthma, uncomplicated: Secondary | ICD-10-CM | POA: Diagnosis not present

## 2018-05-30 DIAGNOSIS — E1329 Other specified diabetes mellitus with other diabetic kidney complication: Secondary | ICD-10-CM

## 2018-05-30 DIAGNOSIS — Z5181 Encounter for therapeutic drug level monitoring: Secondary | ICD-10-CM

## 2018-05-30 DIAGNOSIS — Z170 Estrogen receptor positive status [ER+]: Secondary | ICD-10-CM

## 2018-05-30 DIAGNOSIS — M545 Low back pain: Secondary | ICD-10-CM

## 2018-05-30 DIAGNOSIS — C50911 Malignant neoplasm of unspecified site of right female breast: Secondary | ICD-10-CM

## 2018-05-30 DIAGNOSIS — Z79810 Long term (current) use of selective estrogen receptor modulators (SERMs): Secondary | ICD-10-CM | POA: Diagnosis not present

## 2018-05-30 DIAGNOSIS — Z8673 Personal history of transient ischemic attack (TIA), and cerebral infarction without residual deficits: Secondary | ICD-10-CM

## 2018-05-30 DIAGNOSIS — E785 Hyperlipidemia, unspecified: Secondary | ICD-10-CM | POA: Diagnosis not present

## 2018-05-30 DIAGNOSIS — F419 Anxiety disorder, unspecified: Secondary | ICD-10-CM | POA: Diagnosis not present

## 2018-05-30 DIAGNOSIS — C773 Secondary and unspecified malignant neoplasm of axilla and upper limb lymph nodes: Secondary | ICD-10-CM | POA: Diagnosis not present

## 2018-05-30 DIAGNOSIS — M1990 Unspecified osteoarthritis, unspecified site: Secondary | ICD-10-CM

## 2018-05-30 DIAGNOSIS — I10 Essential (primary) hypertension: Secondary | ICD-10-CM | POA: Diagnosis not present

## 2018-05-30 DIAGNOSIS — C7951 Secondary malignant neoplasm of bone: Secondary | ICD-10-CM | POA: Diagnosis not present

## 2018-05-30 DIAGNOSIS — K449 Diaphragmatic hernia without obstruction or gangrene: Secondary | ICD-10-CM

## 2018-05-30 DIAGNOSIS — Z79899 Other long term (current) drug therapy: Secondary | ICD-10-CM

## 2018-05-30 DIAGNOSIS — G8929 Other chronic pain: Secondary | ICD-10-CM

## 2018-05-30 LAB — CMP (CANCER CENTER ONLY)
ALBUMIN: 3.6 g/dL (ref 3.5–5.0)
ALK PHOS: 79 U/L (ref 38–126)
ALT: 7 U/L (ref 0–44)
AST: 19 U/L (ref 15–41)
Anion gap: 9 (ref 5–15)
BILIRUBIN TOTAL: 0.5 mg/dL (ref 0.3–1.2)
BUN: 13 mg/dL (ref 8–23)
CALCIUM: 9.5 mg/dL (ref 8.9–10.3)
CO2: 26 mmol/L (ref 22–32)
CREATININE: 1.23 mg/dL — AB (ref 0.44–1.00)
Chloride: 106 mmol/L (ref 98–111)
GFR, EST AFRICAN AMERICAN: 48 mL/min — AB (ref >60)
GFR, EST NON AFRICAN AMERICAN: 42 mL/min — AB (ref >60)
Glucose, Bld: 99 mg/dL (ref 70–99)
Potassium: 4.2 mmol/L (ref 3.5–5.1)
SODIUM: 141 mmol/L (ref 135–145)
TOTAL PROTEIN: 7.8 g/dL (ref 6.5–8.1)

## 2018-05-30 LAB — CBC WITH DIFFERENTIAL (CANCER CENTER ONLY)
BASOS PCT: 1 %
Basophils Absolute: 0.1 10*3/uL (ref 0.0–0.1)
EOS ABS: 0 10*3/uL (ref 0.0–0.5)
Eosinophils Relative: 1 %
HCT: 34 % — ABNORMAL LOW (ref 34.8–46.6)
HEMOGLOBIN: 11.3 g/dL — AB (ref 11.6–15.9)
Lymphocytes Relative: 18 %
Lymphs Abs: 0.7 10*3/uL — ABNORMAL LOW (ref 0.9–3.3)
MCH: 37.3 pg — ABNORMAL HIGH (ref 25.1–34.0)
MCHC: 33.3 g/dL (ref 31.5–36.0)
MCV: 112 fL — ABNORMAL HIGH (ref 79.5–101.0)
MONOS PCT: 21 %
Monocytes Absolute: 0.8 10*3/uL (ref 0.1–0.9)
NEUTROS PCT: 59 %
Neutro Abs: 2.2 10*3/uL (ref 1.5–6.5)
Platelet Count: 236 10*3/uL (ref 145–400)
RBC: 3.04 MIL/uL — ABNORMAL LOW (ref 3.70–5.45)
RDW: 15.2 % — ABNORMAL HIGH (ref 11.2–14.5)
WBC: 3.8 10*3/uL — AB (ref 3.9–10.3)

## 2018-05-30 MED ORDER — LOSARTAN POTASSIUM-HCTZ 100-25 MG PO TABS: 1.0000 | ORAL_TABLET | Freq: Every day | ORAL | 3 refills | Status: DC

## 2018-05-30 NOTE — Telephone Encounter (Signed)
Appt with Dr Lindi Adie scheduled.  Message sent to Dr MM regarding f/u appointment in 2 weeks.  I explained to the patient that I would be calling her with date/time per 7/30 los

## 2018-05-30 NOTE — Progress Notes (Signed)
[place] Telephone:([phone]) [phone]   [phone]  OFFICE PROGRESS NOTE  [name], [name], MD [place] [address]  DIAGNOSIS: Metastatic breast adenocarcinoma presented with bone metastasis in the left hip area this was initially diagnosed as a stage II (T2, N1, M0) invasive right breast carcinoma with positive estrogen and progesterone receptor as well as HER-2/neu in July 2002  PRIOR THERAPY: 1. Status post right lumpectomy with right axillary lymph node dissection revealing 4/9 lymph nodes that were positive for malignancy.  2. Status post 8 cycles of systemic chemotherapy with CMF, completed March 2003.  3. Status post radiotherapy to the remaining right breast under the care of Dr. Tammi Klippel.  4. Status post 2 years of treatment with tamoxifen, started July 2003, then switched to Femara by Dr. Humphrey Rolls in August 2005. The patient discontinued the treatment in February 2009.  5. Status post left total hip replacement on Apr 01, 2011, secondary to pathologic left femoral neck fracture. 6. Zometa 4 mg IV on 3 monthly basis. Discontinued secondary to renal insufficiency. 7.  Arimidex 1 mg by mouth daily started 03/18/2011. 8. Arimidex 1 mg by mouth daily and Ibrance 125 mg by mouth daily for 21 days every 4 weeks. First dose started 04/25/2017. Leslee Home will be changed to 100 mg starting 08/22/2017.  Discontinued on 05/30/2018 secondary to disease progression.   CURRENT THERAPY: 1. Xgeva 120 g subcutaneously every 3 months.  INTERVAL HISTORY: [name] [name] 77 y.o. female returns to the clinic today for follow-up visit.  The patient is feeling fine with no specific complaints except for fatigue and chronic back pain.  The patient denied having any fever or chills.  She has no nausea, vomiting, diarrhea or constipation.  She was noticed recently to have rising CA 27.29.  She had repeat CT scan of the chest, abdomen and pelvis as well as bone scan  performed recently and she is here for evaluation and discussion of her risk her results and treatment options.  She has no other concerning complaints.  She has no chest pain, shortness of breath, cough or hemoptysis.  She denied having any recent weight loss or night sweats.  She has no headache or visual changes.  MEDICAL HISTORY: Past Medical History:  Diagnosis Date  . Anxiety    hx of  . Asthma   . Breast cancer ([place])    2002; metastatic in 2012, right breast, spread to left hip in 2012  . Cerebrovascular accident [facility]    R thalamic CVA 01/2010  . Chemotherapy induced neutropenia ([place]) 08/15/2017  . Dyslipidemia   . Encounter for therapeutic drug monitoring 04/14/2017  . Goals of care, counseling/discussion 04/19/2017  . Hyperlipemia   . Hypertension   . Low back pain   . Osteoarthritis   . TIA (transient ischemic attack)    x2 in 2011  . Vertigo 2014    ALLERGIES:  has No Known Allergies.  MEDICATIONS:  Current Outpatient Medications  Medication Sig Dispense Refill  . ALPRAZolam (XANAX) 0.5 MG tablet Take 1 tablet (0.5 mg total) by mouth 2 (two) times daily as needed for anxiety or sleep. 30 tablet 3  . amLODipine (NORVASC) 10 MG tablet Take 0.5 tablets (5 mg total) by mouth daily. 90 tablet 1  . anastrozole (ARIMIDEX) 1 MG tablet TAKE 1 TABLET BY MOUTH ONCE DAILY 90 tablet 2  . Cholecalciferol (VITAMIN D3) 1000 UNITS CAPS Take 1 capsule by mouth daily.     Marland Kitchen  losartan-hydrochlorothiazide (HYZAAR) 100-25 MG tablet Take 1 tablet by mouth daily. 90 tablet 3  . meclizine (ANTIVERT) 12.5 MG tablet Take 1 tablet (12.5 mg total) by mouth 2 (two) times daily as needed for dizziness. 10 tablet 0  . nystatin (MYCOSTATIN/NYSTOP) powder Apply 1 Dose topically daily.  3  . palbociclib (IBRANCE) 100 MG capsule Take 1 capsule (100 mg total) by mouth daily with breakfast. Take whole with food. 21 capsule 2  . spironolactone (ALDACTONE) 50 MG tablet Take 1 tablet (50 mg total) by mouth  daily. 90 tablet 3  . tiZANidine (ZANAFLEX) 4 MG tablet Take 4 mg by mouth every 6 (six) hours as needed for muscle spasms.     No current facility-administered medications for this visit.    Facility-Administered Medications Ordered in Other Visits  Medication Dose Route Frequency Provider Last Rate Last Dose  . alteplase (CATHFLO ACTIVASE) injection 2 mg  2 mg Intracatheter Once PRN Curt Bears, MD      . denosumab (XGEVA) injection 120 mg  120 mg Subcutaneous Once Curt Bears, MD        SURGICAL HISTORY:  Past Surgical History:  Procedure Laterality Date  . ABDOMINAL HYSTERECTOMY     complete  . BREAST LUMPECTOMY     Right breast 2002  . JOINT REPLACEMENT  2012   L hip due to breast ca met  . TOTAL HIP ARTHROPLASTY Right 10/28/2015   Procedure: RIGHT TOTAL HIP ARTHROPLASTY ANTERIOR APPROACH;  Surgeon: Paralee Cancel, MD;  Location: [facility];  Service: Orthopedics;  Laterality: Right;    REVIEW OF SYSTEMS:  Constitutional: positive for fatigue Eyes: negative Ears, nose, mouth, throat, and face: negative Respiratory: negative Cardiovascular: negative Gastrointestinal: negative Genitourinary:negative Integument/breast: negative Hematologic/lymphatic: negative Musculoskeletal:positive for arthralgias and back pain Neurological: positive for paresthesia Behavioral/Psych: negative Endocrine: negative Allergic/Immunologic: negative   PHYSICAL EXAMINATION: General appearance: alert, cooperative, fatigued and no distress Head: Normocephalic, without obvious abnormality, atraumatic Neck: no adenopathy, no JVD, supple, symmetrical, trachea midline and thyroid not enlarged, symmetric, no tenderness/mass/nodules Lymph nodes: Cervical, supraclavicular, and axillary nodes normal. Resp: clear to auscultation bilaterally Back: symmetric, no curvature. ROM normal. No CVA tenderness. Cardio: regular rate and rhythm, S1, S2 normal, no murmur, click, rub or gallop GI: soft,  non-tender; bowel sounds normal; no masses,  no organomegaly Extremities: extremities normal, atraumatic, no cyanosis or edema Neurologic: Alert and oriented X 3, normal strength and tone. Normal symmetric reflexes. Normal coordination and gait  ECOG PERFORMANCE STATUS: 1 - Symptomatic but completely ambulatory  Blood pressure (!) 155/65, pulse 78, temperature 98 F (36.7 C), temperature source Oral, resp. rate 20, height 5' 3"  (1.6 m), weight 203 lb 9.6 oz (92.4 kg), SpO2 97 %.  LABORATORY DATA: Lab Results  Component Value Date   WBC 1.5 (L) 05/16/2018   HGB 11.1 (L) 05/16/2018   HCT 32.8 (L) 05/16/2018   MCV 113.2 (H) 05/16/2018   PLT 135 (L) 05/16/2018      Chemistry      Component Value Date/Time   NA 140 05/16/2018 0917   NA 140 11/03/2017 1044   K 3.6 05/16/2018 0917   K 3.5 11/03/2017 1044   CL 106 05/16/2018 0917   CL 109 (H) 04/25/2013 1023   CO2 25 05/16/2018 0917   CO2 23 11/03/2017 1044   BUN 11 05/16/2018 0917   BUN 9.7 11/03/2017 1044   CREATININE 1.25 (H) 05/16/2018 0917   CREATININE 1.4 (H) 11/03/2017 1044      Component Value Date/Time  CALCIUM 9.1 05/16/2018 0917   CALCIUM 9.2 11/03/2017 1044   ALKPHOS 78 05/16/2018 0917   ALKPHOS 74 11/03/2017 1044   AST 19 05/16/2018 0917   AST 27 11/03/2017 1044   ALT 9 05/16/2018 0917   ALT 16 11/03/2017 1044   BILITOT 0.7 05/16/2018 0917   BILITOT 0.75 11/03/2017 1044       RADIOGRAPHIC STUDIES: Ct Chest W Contrast  Result Date: 05/23/2018 CLINICAL DATA:  Patient with history of right breast cancer diagnosed in 1995. Metastatic disease 2012. EXAM: CT CHEST, ABDOMEN, AND PELVIS WITH CONTRAST TECHNIQUE: Multidetector CT imaging of the chest, abdomen and pelvis was performed following the standard protocol during bolus administration of intravenous contrast. CONTRAST:  42m ISOVUE-300 IOPAMIDOL (ISOVUE-300) INJECTION 61%, 1042mISOVUE-300 IOPAMIDOL (ISOVUE-300) INJECTION 61% COMPARISON:  CT CAP 02/06/2018  FINDINGS: CT CHEST FINDINGS Cardiovascular: Left anterior chest wall Port-A-Cath is present with tip terminating in the superior vena cava. Normal heart size. Prominent/mildly enlarged main pulmonary artery. Thoracic aortic vascular calcifications. Coronary arterial vascular calcifications. Mediastinum/Nodes: Similar-appearing irregular soft tissue right axilla (image 5; series 2). Left axilla is unremarkable. No mediastinal or hilar lymphadenopathy. Lungs/Pleura: Central airways are patent. Re demonstrated postradiation changes anterior right lung. Interval increase in size of multiple predominately pleural based nodules within the left hemithorax. Reference left lower hemithorax nodule measures 16 x 10 mm (image 92; series 6), previously 14 x 10 mm. Reference subpleural left lower hemithorax nodule measures 11 x 16 mm (image 99; series 6), previously 12 x 8 mm. Reference nodule in the lingula measures 15 x 17 mm (image 83; series 6), previously 17 x 12 mm. Reference left upper lobe nodule measures 11 x 10 mm (image 49; series 6), previously 7 x 7 mm. Reference right lower lobe nodule measures 11 x 7 mm (image 99; series 6), previously 9 x 8 mm. Musculoskeletal: Interval increase in sclerotic lesion involving the anterior T11 vertebral body measuring 11 mm, previously 6 mm. Similar extensive sclerotic metastatic lesion involving the T10 vertebral body. Interval increase in size of sclerotic lesion involving the T9 vertebral body measuring 12 x 9 mm (image 76; series 5), previously 9 x 7 mm. CT ABDOMEN PELVIS FINDINGS Hepatobiliary: Interval development of a 3.1 x 3.1 cm low-attenuation mass hepatic dome (image 41; series 2). Small stone in the gallbladder lumen. Pancreas: Unremarkable Spleen: Unremarkable Adrenals/Urinary Tract: Bilateral adrenal gland thickening. Kidneys enhance symmetrically with contrast. No hydronephrosis. Evaluation urinary bladder limited due to streak artifact. Stomach/Bowel: No evidence for  bowel obstruction. Persistent thickening of the sigmoid colon. No free fluid or free intraperitoneal air. Small hiatal hernia. Normal morphology of the stomach. Vascular/Lymphatic: Normal caliber abdominal aorta. Peripheral calcified atherosclerotic plaque. No retroperitoneal lymphadenopathy. Reproductive: Poorly visualized due to streak artifact. Other: None. Musculoskeletal: Bilateral hip arthroplasties. Slight interval increase in size of 1.7 x 1.7 cm sclerotic lesion within the S1 vertebral body (image 82; series 2), previously 1.3 x 1.2 cm. Lumbar spine degenerative changes. Similar patchy sclerotic lesion L5 vertebral body. IMPRESSION: 1. Interval increase in size of multiple nodules, many of which are pleural-based, throughout the lungs bilaterally, left-greater-than-right. 2. Interval development of a 3.1 cm mixed attenuation mass within the hepatic dome concerning for hepatic metastatic disease. 3. Interval increase in size of sclerotic lesions involving the osseous skeleton as above. 4. Similar-appearing soft tissue within the right axilla which may be post treatment in etiology. Recurrence not entirely excluded. 5. Re demonstrated indeterminate thickening of the sigmoid colon, potentially secondary to prior episodes of  colitis. Malignancy not entirely excluded. Electronically Signed   By: Lovey Newcomer M.D.   On: 05/23/2018 15:25   Nm Bone Scan Whole Body  Result Date: 05/23/2018 CLINICAL DATA:  History of breast cancer with osseous metastases. EXAM: NUCLEAR MEDICINE WHOLE BODY BONE SCAN TECHNIQUE: Whole body anterior and posterior images were obtained approximately 3 hours after intravenous injection of radiopharmaceutical. RADIOPHARMACEUTICALS:  21.0 mCi Technetium-59mMDP IV COMPARISON:  Bone scan of February 24, 2018 and April 15, 2017. FINDINGS: Stable abnormal uptake is seen involving both sternoclavicular joints as well as the right shoulder consistent with degenerative change. Stable abnormal uptake  is also noted in the lumbar spine, particularly in its left side, also most consistent with degenerative change. Stable abnormal uptake is noted at T10 level of thoracic spine consistent with osseous metastases. Focus of abnormal uptake is also seen over the upper sacrum concerning for metastatic disease. No new areas of osseous metastases are noted. IMPRESSION: Stable foci of abnormal uptake are noted at T10 level as well as in upper sacrum consistent with metastatic disease. Other areas of abnormal uptake are noted which most likely represent degenerative change. No new foci are noted. Electronically Signed   By: JMarijo Conception M.D.   On: 05/23/2018 15:08   Ct Abdomen Pelvis W Contrast  Result Date: 05/23/2018 CLINICAL DATA:  Patient with history of right breast cancer diagnosed in 1995. Metastatic disease 2012. EXAM: CT CHEST, ABDOMEN, AND PELVIS WITH CONTRAST TECHNIQUE: Multidetector CT imaging of the chest, abdomen and pelvis was performed following the standard protocol during bolus administration of intravenous contrast. CONTRAST:  330mISOVUE-300 IOPAMIDOL (ISOVUE-300) INJECTION 61%, 10034mSOVUE-300 IOPAMIDOL (ISOVUE-300) INJECTION 61% COMPARISON:  CT CAP 02/06/2018 FINDINGS: CT CHEST FINDINGS Cardiovascular: Left anterior chest wall Port-A-Cath is present with tip terminating in the superior vena cava. Normal heart size. Prominent/mildly enlarged main pulmonary artery. Thoracic aortic vascular calcifications. Coronary arterial vascular calcifications. Mediastinum/Nodes: Similar-appearing irregular soft tissue right axilla (image 5; series 2). Left axilla is unremarkable. No mediastinal or hilar lymphadenopathy. Lungs/Pleura: Central airways are patent. Re demonstrated postradiation changes anterior right lung. Interval increase in size of multiple predominately pleural based nodules within the left hemithorax. Reference left lower hemithorax nodule measures 16 x 10 mm (image 92; series 6), previously  14 x 10 mm. Reference subpleural left lower hemithorax nodule measures 11 x 16 mm (image 99; series 6), previously 12 x 8 mm. Reference nodule in the lingula measures 15 x 17 mm (image 83; series 6), previously 17 x 12 mm. Reference left upper lobe nodule measures 11 x 10 mm (image 49; series 6), previously 7 x 7 mm. Reference right lower lobe nodule measures 11 x 7 mm (image 99; series 6), previously 9 x 8 mm. Musculoskeletal: Interval increase in sclerotic lesion involving the anterior T11 vertebral body measuring 11 mm, previously 6 mm. Similar extensive sclerotic metastatic lesion involving the T10 vertebral body. Interval increase in size of sclerotic lesion involving the T9 vertebral body measuring 12 x 9 mm (image 76; series 5), previously 9 x 7 mm. CT ABDOMEN PELVIS FINDINGS Hepatobiliary: Interval development of a 3.1 x 3.1 cm low-attenuation mass hepatic dome (image 41; series 2). Small stone in the gallbladder lumen. Pancreas: Unremarkable Spleen: Unremarkable Adrenals/Urinary Tract: Bilateral adrenal gland thickening. Kidneys enhance symmetrically with contrast. No hydronephrosis. Evaluation urinary bladder limited due to streak artifact. Stomach/Bowel: No evidence for bowel obstruction. Persistent thickening of the sigmoid colon. No free fluid or free intraperitoneal air. Small hiatal hernia.  Normal morphology of the stomach. Vascular/Lymphatic: Normal caliber abdominal aorta. Peripheral calcified atherosclerotic plaque. No retroperitoneal lymphadenopathy. Reproductive: Poorly visualized due to streak artifact. Other: None. Musculoskeletal: Bilateral hip arthroplasties. Slight interval increase in size of 1.7 x 1.7 cm sclerotic lesion within the S1 vertebral body (image 82; series 2), previously 1.3 x 1.2 cm. Lumbar spine degenerative changes. Similar patchy sclerotic lesion L5 vertebral body. IMPRESSION: 1. Interval increase in size of multiple nodules, many of which are pleural-based, throughout the  lungs bilaterally, left-greater-than-right. 2. Interval development of a 3.1 cm mixed attenuation mass within the hepatic dome concerning for hepatic metastatic disease. 3. Interval increase in size of sclerotic lesions involving the osseous skeleton as above. 4. Similar-appearing soft tissue within the right axilla which may be post treatment in etiology. Recurrence not entirely excluded. 5. Re demonstrated indeterminate thickening of the sigmoid colon, potentially secondary to prior episodes of colitis. Malignancy not entirely excluded. Electronically Signed   By: Lovey Newcomer M.D.   On: 05/23/2018 15:25    ASSESSMENT AND PLAN:  This is a very pleasant 77 years old African-American female with metastatic breast carcinoma status post treatment with Arimidex 1 mg by mouth daily since May 2012 Unfortunately the patient developed disease progression with new multiple pulmonary nodules in addition to questionable metastatic disease to the T10 vertebra. The patient was started on treatment with Arimidex 1 mg by mouth daily and Ibrance 125 mg daily for 21 days every 4 weeks and has been tolerating this treatment well except for pancytopenia.  Her dose of Ibrance was a change it to 100 mg p.o. daily for 21 days every 4 weeks in addition to Arimidex. She continues to tolerate this treatment well except for neutropenia. Unfortunately she continues to have rising tumor marker with CA 27.29. I repeated CT scan of the chest, abdomen and pelvis as well as bone scan recently.  Her scan showed evidence for disease progression with increase in size of multiple nodules in the lung in addition to development of new hepatic lesion.  Her bone scan is stable. I had a lengthy discussion with the patient today about her current status and treatment options.  I recommended for her to discontinue her current treatment with Arimidex and Ibrance.  I will refer the patient to Dr. Lindi Adie, our breast cancer specialist for a second  opinion and recommendation regarding the next treatment options. I will see her back for follow-up visit in 2 weeks for reevaluation before starting the new treatment regimen. For the bone disease she will continue her current treatment with Xgeva. She was advised to call immediately if she has any concerning symptoms in the interval. The patient voices understanding of current disease status and treatment options and is in agreement with the current care plan. All questions were answered. The patient knows to call the clinic with any problems, questions or concerns. We can certainly see the patient much sooner if necessary.  Disclaimer: This note was dictated with voice recognition software. Similar sounding words can inadvertently be transcribed and may not be corrected upon review.

## 2018-05-30 NOTE — Telephone Encounter (Signed)
Copied from [place] [phone]. Topic: Quick Communication - Rx Refill/Question >> May 30, 2018  9:13 AM Bea Graff, NT wrote: Medication: losartan-hydrochlorothiazide (HYZAAR) 100-25 MG tablet   Has the patient contacted their pharmacy? Yes.   (Agent: If no, request that the patient contact the pharmacy for the refill.) (Agent: If yes, when and what did the pharmacy advise?)  Preferred Pharmacy (with phone number or street name): [id number] [address], [place]. [phone] (Phone) [phone] (Fax)      Agent: Please be advised that RX refills may take up to 3 business days. We ask that you follow-up with your pharmacy.

## 2018-05-31 ENCOUNTER — Encounter: Payer: Self-pay | Admitting: Podiatry

## 2018-05-31 ENCOUNTER — Telehealth: Payer: Self-pay | Admitting: Internal Medicine

## 2018-05-31 ENCOUNTER — Ambulatory Visit: Payer: Medicare HMO | Admitting: Podiatry

## 2018-05-31 DIAGNOSIS — L600 Ingrowing nail: Secondary | ICD-10-CM | POA: Diagnosis not present

## 2018-05-31 NOTE — Patient Instructions (Signed)
The day after your procedure:  Place 1/4 cup of epsom salts in a quart of warm tap water.  Submerge your foot or feet in the solution and soak for 20 minutes.  This soak should be done twice a day.  Next, remove your foot or feet from solution, blot dry the affected area. Apply ointment and cover if instructed by your doctor.   IF YOUR SKIN BECOMES IRRITATED WHILE USING THESE INSTRUCTIONS, IT IS OKAY TO SWITCH TO  WHITE VINEGAR AND WATER.  As another alternative soak, you may use antibacterial soap and water.  Monitor for any signs/symptoms of infection. Call the office immediately if any occur or go directly to the emergency room. Call with any questions/concerns.   [place] Instructions-Post Nail Surgery  You have had your ingrown toenail and root treated with a chemical.  This chemical causes a burn that will drain and ooze like a blister.  This can drain for 6-8 weeks or longer.  It is important to keep this area clean, covered, and follow the soaking instructions dispensed at the time of your surgery.  This area will eventually dry and form a scab.  Once the scab forms you no longer need to soak or apply a dressing.  If at any time you experience an increase in pain, redness, swelling, or drainage, you should contact the office as soon as possible.

## 2018-05-31 NOTE — Telephone Encounter (Signed)
Appointments scheduled and patient notified per 7/30 staff message

## 2018-06-06 NOTE — Progress Notes (Signed)
   Subjective: 77 year old female presents today as a new patient for evaluation of pain to the lateral border of the right hallux and medial border of the left hallux that began 6-8 months ago. Patient is concerned for possible ingrown nail. Wearing shoes increases the pain. She has not done anything for treatment. Patient presents today for further treatment and evaluation.  Past Medical History:  Diagnosis Date  . Anxiety    hx of  . Asthma   . Breast cancer ([place])    2002; metastatic in 2012, right breast, spread to left hip in 2012  . Cerebrovascular accident [facility])    R thalamic CVA 01/2010  . Chemotherapy induced neutropenia ([place]) 08/15/2017  . Dyslipidemia   . Encounter for therapeutic drug monitoring 04/14/2017  . Goals of care, counseling/discussion 04/19/2017  . Hyperlipemia   . Hypertension   . Low back pain   . Osteoarthritis   . TIA (transient ischemic attack)    x2 in 2011  . Vertigo 2014    Objective:  General: Well developed, nourished, in no acute distress, alert and oriented x3   Dermatology: Skin is warm, dry and supple bilateral. Lateral border of the right hallux and medial border of the left hallux appears to be erythematous with evidence of an ingrowing nail. Pain on palpation noted to the border of the nail fold. The remaining nails appear unremarkable at this time. There are no open sores, lesions.  Vascular: Dorsalis Pedis artery and Posterior Tibial artery pedal pulses palpable. No lower extremity edema noted.   Neruologic: Grossly intact via light touch bilateral.  Musculoskeletal: Muscular strength within normal limits in all groups bilateral. Normal range of motion noted to all pedal and ankle joints.   Assesement: #1 Paronychia with ingrowing nail lateral border right hallux, medial border left hallux  #2 Pain in toe #3 Incurvated nail  Plan of Care:  1. Patient evaluated.  2. Discussed treatment alternatives and plan of care. Explained nail  avulsion procedure and post procedure course to patient. 3. Patient opted for permanent partial nail avulsion to the lateral border of the right hallux and the medial border of the left hallux.  4. Prior to procedure, local anesthesia infiltration utilized using 3 ml of a 50:50 mixture of 2% plain lidocaine and 0.5% plain marcaine in a normal hallux block fashion and a betadine prep performed.  5. Partial permanent nail avulsion with chemical matrixectomy performed using [id number] applications of phenol followed by alcohol flush.  6. Light dressing applied. 7. Return to clinic in 2 weeks.   Edrick Kins, DPM [facility]  Dr. Edrick Kins, [place]                                        [place], [place] [address]                Office [phone]  Fax [phone]

## 2018-06-12 ENCOUNTER — Other Ambulatory Visit: Payer: Self-pay

## 2018-06-12 ENCOUNTER — Inpatient Hospital Stay: Payer: Medicare HMO | Attending: Oncology | Admitting: Hematology and Oncology

## 2018-06-12 ENCOUNTER — Inpatient Hospital Stay: Payer: Medicare HMO

## 2018-06-12 ENCOUNTER — Telehealth: Payer: Self-pay | Admitting: Hematology and Oncology

## 2018-06-12 DIAGNOSIS — Z9221 Personal history of antineoplastic chemotherapy: Secondary | ICD-10-CM | POA: Diagnosis not present

## 2018-06-12 DIAGNOSIS — Z79899 Other long term (current) drug therapy: Secondary | ICD-10-CM | POA: Diagnosis not present

## 2018-06-12 DIAGNOSIS — Z79811 Long term (current) use of aromatase inhibitors: Secondary | ICD-10-CM | POA: Diagnosis not present

## 2018-06-12 DIAGNOSIS — Z170 Estrogen receptor positive status [ER+]: Secondary | ICD-10-CM | POA: Diagnosis not present

## 2018-06-12 DIAGNOSIS — C50911 Malignant neoplasm of unspecified site of right female breast: Secondary | ICD-10-CM | POA: Diagnosis not present

## 2018-06-12 DIAGNOSIS — C787 Secondary malignant neoplasm of liver and intrahepatic bile duct: Secondary | ICD-10-CM | POA: Diagnosis not present

## 2018-06-12 DIAGNOSIS — C7951 Secondary malignant neoplasm of bone: Principal | ICD-10-CM

## 2018-06-12 DIAGNOSIS — C773 Secondary and unspecified malignant neoplasm of axilla and upper limb lymph nodes: Secondary | ICD-10-CM | POA: Diagnosis not present

## 2018-06-12 NOTE — Telephone Encounter (Signed)
Gave avs and calendar ° °

## 2018-06-12 NOTE — Progress Notes (Signed)
Patient Care Team: [name], [name], MD as PCP - General (Internal Medicine) Curt Bears, MD (Hematology and Oncology) Paralee Cancel, MD (Orthopedic Surgery) Tyler Pita, MD (Radiation Oncology)  DIAGNOSIS:  Encounter Diagnosis  Name Primary?  . Breast cancer metastasized to bone, right ([place])     SUMMARY OF ONCOLOGIC HISTORY:   Breast cancer metastasized to bone, right ([place])   05/22/2001 Initial Diagnosis    Rt breast: stage IIb (T2, N1, M0) invasive right breast carcinoma with positive estrogen and progesterone receptor as well as HER-2/neu    06/12/2001 Surgery    right lumpectomy with right axillary lymph node dissection revealing 4/9 lymph nodes that were positive for malignancy.     08/12/2001 - 01/10/2002 Chemotherapy    Adj chemo CMF X 8     05/07/2002 - 12/14/2007 Anti-estrogen oral therapy    Adj Tamoxifen for 2 years and then switched to Letrozole (by Dr.Khan in Aug 2005), stopped Feb 2009    02/11/2003 - 04/05/2003 Radiation Therapy    Adj XRT by Tammi Klippel    03/18/2011 -  Anti-estrogen oral therapy    Anastrozole 1 mg daily    04/01/2011 Surgery    Left total hip replacement secondary to pathologic left femoral neck fracture    04/25/2017 Relapse/Recurrence    Anastrozole with Ibrance discontinued 05/30/2018 due to disease progression, Xgeva for bone metastases every 3 months     CHIEF COMPLIANT: Progression on Ibrance with anastrozole  INTERVAL HISTORY: [name] [name] is a 77 year old with above-mentioned history of metastatic breast cancer currently on Ibrance with anastrozole.  She did not have any major side effects from the treatment however there was evidence of progression of disease both in the liver as well as in the bones.  There was also progression in the lungs.  Because of this Dr. Julien Nordmann wanted my opinion.  REVIEW OF SYSTEMS:   Constitutional: Denies fevers, chills or abnormal weight loss Eyes: Denies blurriness of vision Ears, nose,  mouth, throat, and face: Denies mucositis or sore throat Respiratory: Denies cough, dyspnea or wheezes Cardiovascular: Denies palpitation, chest discomfort Gastrointestinal:  Denies nausea, heartburn or change in bowel habits Skin: Denies abnormal skin rashes Lymphatics: Denies new lymphadenopathy or easy bruising Neurological:Denies numbness, tingling or new weaknesses Behavioral/Psych: Mood is stable, no new changes  Extremities: No lower extremity edema  All other systems were reviewed with the patient and are negative.  I have reviewed the past medical history, past surgical history, social history and family history with the patient and they are unchanged from previous note.  ALLERGIES:  has No Known Allergies.  MEDICATIONS:  Current Outpatient Medications  Medication Sig Dispense Refill  . ALPRAZolam (XANAX) 0.5 MG tablet Take 1 tablet (0.5 mg total) by mouth 2 (two) times daily as needed for anxiety or sleep. 30 tablet 3  . amLODipine (NORVASC) 10 MG tablet Take 0.5 tablets (5 mg total) by mouth daily. 90 tablet 1  . anastrozole (ARIMIDEX) 1 MG tablet TAKE 1 TABLET BY MOUTH ONCE DAILY 90 tablet 2  . Cholecalciferol (VITAMIN D3) 1000 UNITS CAPS Take 1 capsule by mouth daily.     Marland Kitchen losartan-hydrochlorothiazide (HYZAAR) 100-25 MG tablet Take 1 tablet by mouth daily. 90 tablet 3  . meclizine (ANTIVERT) 12.5 MG tablet Take 1 tablet (12.5 mg total) by mouth 2 (two) times daily as needed for dizziness. 10 tablet 0  . nystatin (MYCOSTATIN/NYSTOP) powder Apply 1 Dose topically daily.  3  . palbociclib (IBRANCE) 100 MG capsule Take  1 capsule (100 mg total) by mouth daily with breakfast. Take whole with food. 21 capsule 2  . spironolactone (ALDACTONE) 50 MG tablet Take 1 tablet (50 mg total) by mouth daily. 90 tablet 3  . tiZANidine (ZANAFLEX) 4 MG tablet Take 4 mg by mouth every 6 (six) hours as needed for muscle spasms.     No current facility-administered medications for this visit.     Facility-Administered Medications Ordered in Other Visits  Medication Dose Route Frequency Provider Last Rate Last Dose  . alteplase (CATHFLO ACTIVASE) injection 2 mg  2 mg Intracatheter Once PRN Curt Bears, MD      . denosumab (XGEVA) injection 120 mg  120 mg Subcutaneous Once Curt Bears, MD        PHYSICAL EXAMINATION: ECOG PERFORMANCE STATUS: 1 - Symptomatic but completely ambulatory  Vitals:   06/12/18 0954  BP: (!) 154/79  Pulse: 88  Resp: 17  Temp: 98.2 F (36.8 C)  SpO2: 100%   Filed Weights   06/12/18 0954  Weight: 205 lb (93 kg)    GENERAL:alert, no distress and comfortable SKIN: skin color, texture, turgor are normal, no rashes or significant lesions EYES: normal, Conjunctiva are pink and non-injected, sclera clear OROPHARYNX:no exudate, no erythema and lips, buccal mucosa, and tongue normal  NECK: supple, thyroid normal size, non-tender, without nodularity LYMPH:  no palpable lymphadenopathy in the cervical, axillary or inguinal LUNGS: clear to auscultation and percussion with normal breathing effort HEART: regular rate & rhythm and no murmurs and no lower extremity edema ABDOMEN:abdomen soft, non-tender and normal bowel sounds MUSCULOSKELETAL:no cyanosis of digits and no clubbing  NEURO: alert & oriented x 3 with fluent speech, no focal motor/sensory deficits EXTREMITIES: No lower extremity edema   LABORATORY DATA:  I have reviewed the data as listed CMP Latest Ref Rng & Units 05/30/2018 05/16/2018 04/18/2018  Glucose 70 - 99 mg/dL 99 98 98  BUN 8 - 23 mg/dL 13 11 14   Creatinine 0.44 - 1.00 mg/dL 1.23(H) 1.25(H) 1.25(H)  Sodium 135 - 145 mmol/L 141 140 140  Potassium 3.5 - 5.1 mmol/L 4.2 3.6 4.2  Chloride 98 - 111 mmol/L 106 106 106  CO2 22 - 32 mmol/L 26 25 27   Calcium 8.9 - 10.3 mg/dL 9.5 9.1 9.3  Total Protein 6.5 - 8.1 g/dL 7.8 8.1 7.9  Total Bilirubin 0.3 - 1.2 mg/dL 0.5 0.7 0.5  Alkaline Phos 38 - 126 U/L 79 78 78  AST 15 - 41 U/L 19 19  17   ALT 0 - 44 U/L 7 9 6     Lab Results  Component Value Date   WBC 3.8 (L) 05/30/2018   HGB 11.3 (L) 05/30/2018   HCT 34.0 (L) 05/30/2018   MCV 112.0 (H) 05/30/2018   PLT 236 05/30/2018   NEUTROABS 2.2 05/30/2018    ASSESSMENT & PLAN:  Breast cancer metastasized to bone, right ([facility]) Metastatic breast cancer with bone metastases along with lung and liver metastases, progressed on Ibrance with anastrozole.  Radiology review: 05/23/2018 CT CAP: New liver metastases 3.1 cm, increase in lung nodules, increase in sclerotic lesion of T11 and T9 and S1 vertebral body.  Recommendation: 1.  Biopsy of the liver: Sent for foundation 1 analysis along with PDL 1 2. BRCA testing 3.  I discussed different treatment options.  These include A.  Faslodex with Ibrance B.  Exemestane with everolimus C.  Systemic chemotherapy with Taxol D.  Systemic chemotherapy with keep Xeloda  As we await the results of  mutation testing, I recommended switching her to Faslodex with Ibrance.  Patient will follow with Dr. Earlie Server can see me if there are any questions or concerns.    Orders Placed This Encounter  Procedures  . US BIOPSY (LIVER)    Standing Status:   Future    Standing Expiration Date:   08/13/2019    Order Specific Question:   Lab orders requested (DO NOT place separate lab orders, these will be automatically ordered during procedure specimen collection):    Answer:   Surgical Pathology    Comments:   PDL-1, PI3K mutation, Breast Progniostic panel    Order Specific Question:   Reason for Exam (SYMPTOM  OR DIAGNOSIS REQUIRED)    Answer:   Met breast cancer with liver mets    Order Specific Question:   Preferred imaging location?    Answer:   [facility]  . BRCA Deletion/Duplication Test   The patient has a good understanding of the overall plan. she agrees with it. she will call with any problems that may develop before the next visit here.   Harriette Ohara,  MD 06/12/18

## 2018-06-12 NOTE — Assessment & Plan Note (Signed)
Metastatic breast cancer with bone metastases along with lung and liver metastases, progressed on Ibrance with anastrozole.  Radiology review: 05/23/2018 CT CAP: New liver metastases 3.1 cm, increase in lung nodules, increase in sclerotic lesion of T11 and T9 and S1 vertebral body.  Recommendation: 1.  Biopsy of the liver: Sent for foundation 1 analysis along with PDL 1 2. BRCA testing 3.  I discussed different treatment options.  These include A.  Faslodex with Ibrance B.  Exemestane with everolimus C.  Systemic chemotherapy with Taxol D.  Systemic chemotherapy with keep Xeloda  As we await the results of mutation testing, I recommended switching her to Faslodex with Ibrance.  Patient will follow with Dr. Earlie Server can see me if there are any questions or concerns.

## 2018-06-12 NOTE — Progress Notes (Signed)
Patient was accessed using sterile technique for BRCA labs.  Flush completed.  Appointment for upcoming flush appointment canceled d/t flush being completed today.  Patient aware.

## 2018-06-13 DIAGNOSIS — H40033 Anatomical narrow angle, bilateral: Secondary | ICD-10-CM | POA: Diagnosis not present

## 2018-06-13 DIAGNOSIS — H04123 Dry eye syndrome of bilateral lacrimal glands: Secondary | ICD-10-CM | POA: Diagnosis not present

## 2018-06-14 ENCOUNTER — Ambulatory Visit: Payer: Medicare HMO | Admitting: Podiatry

## 2018-06-14 DIAGNOSIS — B351 Tinea unguium: Secondary | ICD-10-CM

## 2018-06-14 DIAGNOSIS — L600 Ingrowing nail: Secondary | ICD-10-CM

## 2018-06-14 DIAGNOSIS — M79676 Pain in unspecified toe(s): Secondary | ICD-10-CM

## 2018-06-15 ENCOUNTER — Ambulatory Visit: Payer: Medicare HMO | Admitting: Internal Medicine

## 2018-06-16 ENCOUNTER — Other Ambulatory Visit: Payer: Self-pay | Admitting: Radiology

## 2018-06-16 NOTE — Progress Notes (Signed)
   Subjective: Patient presents today 2 weeks post ingrown nail permanent nail avulsion procedure of the medial border of the left hallux and lateral border of the right hallux. Patient states that the toe and nail fold is feeling much better.  She is also complaining of elongated, thickened nails of bilateral feet that cause pain while ambulating in shoes. She is unable to trim her own nails. Patient is here for further evaluation and treatment.   Past Medical History:  Diagnosis Date  . Anxiety    hx of  . Asthma   . Breast cancer ([place])    2002; metastatic in 2012, right breast, spread to left hip in 2012  . Cerebrovascular accident [facility])    R thalamic CVA 01/2010  . Chemotherapy induced neutropenia ([place]) 08/15/2017  . Dyslipidemia   . Encounter for therapeutic drug monitoring 04/14/2017  . Goals of care, counseling/discussion 04/19/2017  . Hyperlipemia   . Hypertension   . Low back pain   . Osteoarthritis   . TIA (transient ischemic attack)    x2 in 2011  . Vertigo 2014    Objective: Skin is warm, dry and supple. Nail and respective nail fold appears to be healing appropriately. Open wound to the associated nail fold with a granular wound base and moderate amount of fibrotic tissue. Minimal drainage noted. Mild erythema around the periungual region likely due to phenol chemical matricectomy. Nails are tender, long, thickened and dystrophic with subungual debris, consistent with onychomycosis, 1-5 bilateral. No signs of infection noted.   Assessment: #1 postop permanent partial nail avulsion medial border left hallux, lateral border right hallux #2 open wound periungual nail fold of respective digit.  #3 Onychodystrophic nails 1-5 bilateral with hyperkeratosis of nails.  #4 Onychomycosis of nail due to dermatophyte bilateral  Plan of care: #1 patient was evaluated  #2 debridement of open wound was performed to the periungual border of the respective toe using a currette.  Antibiotic ointment and Band-Aid was applied. #3 patient is to return to clinic on a PRN basis.   Edrick Kins, DPM [facility]  Dr. Edrick Kins, [address]                                        [place], [place] [address]                Office [phone]  Fax [phone]

## 2018-06-19 ENCOUNTER — Ambulatory Visit (HOSPITAL_COMMUNITY)
Admission: RE | Admit: 2018-06-19 | Discharge: 2018-06-19 | Disposition: A | Discharge status: Home / Self Care | Payer: Medicare HMO | Source: Ambulatory Visit | Attending: Hematology and Oncology | Admitting: Hematology and Oncology

## 2018-06-19 ENCOUNTER — Encounter (HOSPITAL_COMMUNITY): Payer: Self-pay

## 2018-06-19 DIAGNOSIS — C787 Secondary malignant neoplasm of liver and intrahepatic bile duct: Secondary | ICD-10-CM | POA: Insufficient documentation

## 2018-06-19 DIAGNOSIS — C7951 Secondary malignant neoplasm of bone: Secondary | ICD-10-CM | POA: Diagnosis not present

## 2018-06-19 DIAGNOSIS — F419 Anxiety disorder, unspecified: Secondary | ICD-10-CM | POA: Insufficient documentation

## 2018-06-19 DIAGNOSIS — Z79899 Other long term (current) drug therapy: Secondary | ICD-10-CM | POA: Insufficient documentation

## 2018-06-19 DIAGNOSIS — D701 Agranulocytosis secondary to cancer chemotherapy: Secondary | ICD-10-CM | POA: Insufficient documentation

## 2018-06-19 DIAGNOSIS — Z96643 Presence of artificial hip joint, bilateral: Secondary | ICD-10-CM | POA: Insufficient documentation

## 2018-06-19 DIAGNOSIS — Z8673 Personal history of transient ischemic attack (TIA), and cerebral infarction without residual deficits: Secondary | ICD-10-CM | POA: Insufficient documentation

## 2018-06-19 DIAGNOSIS — J45909 Unspecified asthma, uncomplicated: Secondary | ICD-10-CM | POA: Diagnosis not present

## 2018-06-19 DIAGNOSIS — C801 Malignant (primary) neoplasm, unspecified: Secondary | ICD-10-CM | POA: Diagnosis not present

## 2018-06-19 DIAGNOSIS — C50911 Malignant neoplasm of unspecified site of right female breast: Secondary | ICD-10-CM | POA: Diagnosis not present

## 2018-06-19 DIAGNOSIS — I10 Essential (primary) hypertension: Secondary | ICD-10-CM | POA: Insufficient documentation

## 2018-06-19 DIAGNOSIS — K7689 Other specified diseases of liver: Secondary | ICD-10-CM | POA: Diagnosis not present

## 2018-06-19 DIAGNOSIS — R918 Other nonspecific abnormal finding of lung field: Secondary | ICD-10-CM | POA: Diagnosis not present

## 2018-06-19 DIAGNOSIS — E785 Hyperlipidemia, unspecified: Secondary | ICD-10-CM | POA: Insufficient documentation

## 2018-06-19 LAB — CBC
HEMATOCRIT: 31.2 % — AB (ref 36.0–46.0)
HEMOGLOBIN: 10.4 g/dL — AB (ref 12.0–15.0)
MCH: 35.4 pg — ABNORMAL HIGH (ref 26.0–34.0)
MCHC: 33.3 g/dL (ref 30.0–36.0)
MCV: 106.1 fL — ABNORMAL HIGH (ref 78.0–100.0)
Platelets: 253 10*3/uL (ref 150–400)
RBC: 2.94 MIL/uL — AB (ref 3.87–5.11)
RDW: 13.6 % (ref 11.5–15.5)
WBC: 5.4 10*3/uL (ref 4.0–10.5)

## 2018-06-19 LAB — PROTIME-INR
INR: 1
Prothrombin Time: 13.1 seconds (ref 11.4–15.2)

## 2018-06-19 LAB — APTT: aPTT: 39 seconds — ABNORMAL HIGH (ref 24–36)

## 2018-06-19 MED ORDER — FENTANYL CITRATE (PF) 100 MCG/2ML IJ SOLN
INTRAMUSCULAR | Status: AC | PRN
  Administered 2018-06-19 (×2): 50 ug via INTRAVENOUS

## 2018-06-19 MED ORDER — MIDAZOLAM HCL 2 MG/2ML IJ SOLN
INTRAMUSCULAR | Status: AC
  Filled 2018-06-19: qty 2

## 2018-06-19 MED ORDER — GELATIN ABSORBABLE 12-7 MM EX MISC
CUTANEOUS | Status: AC
  Filled 2018-06-19: qty 1

## 2018-06-19 MED ORDER — HYDROCODONE-ACETAMINOPHEN 5-325 MG PO TABS: 1.0000 | ORAL_TABLET | ORAL | Status: DC | PRN

## 2018-06-19 MED ORDER — MIDAZOLAM HCL 2 MG/2ML IJ SOLN
INTRAMUSCULAR | Status: AC | PRN
  Administered 2018-06-19 (×3): 1 mg via INTRAVENOUS

## 2018-06-19 MED ORDER — SODIUM CHLORIDE 0.9 % IV SOLN
INTRAVENOUS | Status: DC
  Administered 2018-06-19: 12:00:00 via INTRAVENOUS

## 2018-06-19 MED ORDER — FENTANYL CITRATE (PF) 100 MCG/2ML IJ SOLN
INTRAMUSCULAR | Status: AC
  Filled 2018-06-19: qty 2

## 2018-06-19 MED ORDER — HEPARIN SOD (PORK) LOCK FLUSH 100 UNIT/ML IV SOLN
500.0000 [IU] | Freq: Once | INTRAVENOUS | Status: AC
  Administered 2018-06-19: 500 [IU] via INTRAVENOUS
  Filled 2018-06-19: qty 5

## 2018-06-19 NOTE — Procedures (Signed)
Interventional Radiology Procedure Note  Procedure: US guided liver biopsy  Complications: None  Estimated Blood Loss: < 10 mL  Findings: 3.6 cm hypoechoic mass near dome of liver in right lobe. 18 G core biopsy x 2 via 17 G needle. Gelfoam pledgets advanced on completion.  Venetia Night. Kathlene Cote, M.D Pager:  [phone]

## 2018-06-19 NOTE — Discharge Instructions (Signed)
Liver Biopsy °The liver is a large organ in the upper right-hand side of your abdomen. A liver biopsy is a procedure in which a tissue sample is taken from the liver and examined under a microscope. The procedure is done to confirm a suspected problem. °There are three types of liver biopsies: °· Percutaneous. In this type, an incision is made in your abdomen. The sample is removed through the incision with a needle. °· Laparoscopic. In this type, several incisions are made in the abdomen. A tiny camera is passed through one of the incisions to help guide the health care provider. The sample is removed through the other incision or incisions. °· Transjugular. In this type, an incision is made in the neck. A tube is passed through the incision to the liver. The sample is removed through the tube with a needle. ° °Tell a health care provider about: °· Any allergies you have. °· All medicines you are taking, including vitamins, herbs, eye drops, creams, and over-the-counter medicines. °· Any problems you or family members have had with anesthetic medicines. °· Any blood disorders you have. °· Any surgeries you have had. °· Any medical conditions you have. °· Possibility of pregnancy, if this applies. °What are the risks? °Generally, this is a safe procedure. However, problems can occur and include: °· Bleeding. °· Infection. °· Bruising. °· Collapsed lung. °· Leak of digestive juices (bile) from the liver or gallbladder. °· Problems with heart rhythm. °· Pain at the biopsy site or in the right shoulder. °· Low blood pressure (hypotension). °· Injury to nearby organs or tissues. ° °What happens before the procedure? °· Your health care provider may do some blood or urine tests. These will help your health care provider learn how well your kidneys and liver are working and how well your blood clots. °· Ask your health care provider if you will be able to go home the day of the procedure. Arrange for someone to take you  home and stay with you for at least 24 hours. °· Do not eat or drink anything after midnight on the night before the procedure or as directed by your health care provider. °· Ask your health care provider about: °? Changing or stopping your regular medicines. This is especially important if you are taking diabetes medicines or blood thinners. °? Taking medicines such as aspirin and ibuprofen. These medicines can thin your blood. Do not take these medicines before your procedure if your health care provider asks you not to. °What happens during the procedure? °Regardless of the type of biopsy that will be done, you will have an IV line placed. Through this line, you will receive fluids and medicine to relax you. If you will be having a laparoscopic biopsy, you may also receive medicine through this line to make you sleep during the procedure (general anesthetic). °Percutaneous Liver Biopsy °· You will positioned on your back, with your right hand over your head. °· A health care provider will locate your liver by tapping and pressing on the right side of your abdomen or with the help of an ultrasound machine or CT scan. °· An area at the bottom of your last right rib will be numbed. °· An incision will be made in the numbed area. °· The biopsy needle will be inserted into the incision. °· Several samples of liver tissue will be taken with the biopsy needle. You will be asked to hold your breath as each sample is taken. °Laparoscopic Liver   Biopsy  You will be positioned on your back.  Several small incisions will be made in your abdomen.  Your doctor will pass a tiny camera through one incision. The camera will allow the liver to be viewed on a TV monitor in the operating room.  Tools will be passed through the other incision or incisions. These tools will be used to remove samples of liver tissue. Transjugular Liver Biopsy  You will be positioned on your back on an X-ray table, with your head turned to  your left.  An area on your neck just over your jugular vein will be numbed.  An incision will be made in the numbed area.  A tiny tube will be inserted through the incision. It will be pushed through the jugular vein to a blood vessel in the liver called the hepatic vein.  Dye will be inserted through the tube, and X-rays will be taken. The dye will make the blood vessels in the liver light up on the X-rays.  The biopsy needle will be pushed through the tube until it reaches the liver.  Samples of liver tissue will be taken with the biopsy needle.  The needle and the tube will be removed. After the samples are obtained, the incision or incisions will be closed. What happens after the procedure?  You will be taken to a recovery area.  You may have to lie on your right side for 1-2 hours. This will prevent bleeding from the biopsy site.  Your progress will be watched. Your blood pressure, pulse, and the biopsy site will be checked often.  You may have some pain or feel sick. If this happens, tell your health care provider.  As you begin to feel better, you will be offered ice and beverages.  You may be allowed to go home when the medicines have worn off and you can walk, drink, eat, and use the bathroom. This information is not intended to replace advice given to you by your health care provider. Make sure you discuss any questions you have with your health care provider. Document Released: 01/08/2004 Document Revised: 03/22/2016 Document Reviewed: 12/14/2013 Elsevier Interactive Patient Education  2018 [place]. Liver Biopsy, Care After Refer to this sheet in the next few weeks. These instructions provide you with information on caring for yourself after your procedure. Your health care provider may also give you more specific instructions. Your treatment has been planned according to current medical practices, but problems sometimes occur. Call your health care provider if you  have any problems or questions after your procedure. What can I expect after the procedure? After your procedure, it is typical to have the following:  A small amount of discomfort in the area where the biopsy was done and in the right shoulder or shoulder blade.  A small amount of bruising around the area where the biopsy was done and on the skin over the liver.  Sleepiness and fatigue for the rest of the day.  Follow these instructions at home:  Rest at home for 1-2 days or as directed by your health care provider.  Have a friend or family member stay with you for at least 24 hours.  Because of the medicines used during the procedure, you should not do the following things in the first 24 hours: ? Drive. ? Use machinery. ? Be responsible for the care of other people. ? Sign legal documents. ? Take a bath or shower.  There are many different ways to  close and cover an incision, including stitches, skin glue, and adhesive strips. Follow your health care provider's instructions on: ? Incision care. ? Bandage (dressing) changes and removal. ? Incision closure removal.  Do not drink alcohol in the first week.  Do not lift more than 5 pounds or play contact sports for 2 weeks after this test.  Take medicines only as directed by your health care provider. Do not take medicine containing aspirin or non-steroidal anti-inflammatory medicines such as ibuprofen for 1 week after this test.  It is your responsibility to get your test results. Contact a health care provider if:  You have increased bleeding from an incision that results in more than a small spot of blood.  You have redness, swelling, or increasing pain in any incisions.  You notice a discharge or a bad smell coming from any of your incisions.  You have a fever or chills. Get help right away if:  You develop swelling, bloating, or pain in your abdomen.  You become dizzy or faint.  You develop a rash.  You are  nauseous or vomit.  You have difficulty breathing, feel short of breath, or feel faint.  You develop chest pain.  You have problems with your speech or vision.  You have trouble balancing or moving your arms or legs. This information is not intended to replace advice given to you by your health care provider. Make sure you discuss any questions you have with your health care provider. Document Released: 05/07/2005 Document Revised: 03/25/2016 Document Reviewed: 12/14/2013 Elsevier Interactive Patient Education  2018 [place]. Moderate Conscious Sedation, Adult, Care After These instructions provide you with information about caring for yourself after your procedure. Your health care provider may also give you more specific instructions. Your treatment has been planned according to current medical practices, but problems sometimes occur. Call your health care provider if you have any problems or questions after your procedure. What can I expect after the procedure? After your procedure, it is common:  To feel sleepy for several hours.  To feel clumsy and have poor balance for several hours.  To have poor judgment for several hours.  To vomit if you eat too soon.  Follow these instructions at home: For at least 24 hours after the procedure:   Do not: ? Participate in activities where you could fall or become injured. ? Drive. ? Use heavy machinery. ? Drink alcohol. ? Take sleeping pills or medicines that cause drowsiness. ? Make important decisions or sign legal documents. ? Take care of children on your own.  Rest. Eating and drinking  Follow the diet recommended by your health care provider.  If you vomit: ? Drink water, juice, or soup when you can drink without vomiting. ? Make sure you have little or no nausea before eating solid foods. General instructions  Have a responsible adult stay with you until you are awake and alert.  Take over-the-counter and  prescription medicines only as told by your health care provider.  If you smoke, do not smoke without supervision.  Keep all follow-up visits as told by your health care provider. This is important. Contact a health care provider if:  You keep feeling nauseous or you keep vomiting.  You feel light-headed.  You develop a rash.  You have a fever. Get help right away if:  You have trouble breathing. This information is not intended to replace advice given to you by your health care provider. Make sure you discuss any  questions you have with your health care provider. Document Released: 08/08/2013 Document Revised: 03/22/2016 Document Reviewed: 02/07/2016 Elsevier Interactive Patient Education  [employer].

## 2018-06-19 NOTE — H&P (Signed)
Referring Physician(s): Nicholas Lose  Supervising Physician: Aletta Edouard  Patient Status:  [facility]  Chief Complaint:  "I'm having a liver biopsy"  Subjective: Patient familiar to IR service from prior left femoral neck lesion biopsy  as well as Port-A-Cath placement in 2012.  She has a history of progressive metastatic breast carcinoma with recent imaging showing interval increase in size of multiple nodules throughout lungs, 3.1 cm mass within the hepatic dome, and increase in size of sclerotic lesions involving osseous skeleton.  She presents today for image guided liver lesion biopsy for further evaluation.  Currently denies fever, headache, chest pain, dyspnea, cough, abdominal pain, nausea, vomiting.  She does have intermittent back pain, neuropathy and some occasional vaginal spotting.  Past Medical History:  Diagnosis Date  . Anxiety    hx of  . Asthma   . Breast cancer ([place])    2002; metastatic in 2012, right breast, spread to left hip in 2012  . Cerebrovascular accident [facility])    R thalamic CVA 01/2010  . Chemotherapy induced neutropenia ([place]) 08/15/2017  . Dyslipidemia   . Encounter for therapeutic drug monitoring 04/14/2017  . Goals of care, counseling/discussion 04/19/2017  . Hyperlipemia   . Hypertension   . Low back pain   . Osteoarthritis   . TIA (transient ischemic attack)    x2 in 2011  . Vertigo 2014   Past Surgical History:  Procedure Laterality Date  . ABDOMINAL HYSTERECTOMY     complete  . BREAST LUMPECTOMY     Right breast 2002  . JOINT REPLACEMENT  2012   L hip due to breast ca met  . TOTAL HIP ARTHROPLASTY Right 10/28/2015   Procedure: RIGHT TOTAL HIP ARTHROPLASTY ANTERIOR APPROACH;  Surgeon: Paralee Cancel, MD;  Location: [facility];  Service: Orthopedics;  Laterality: Right;      Allergies: Patient has no known allergies.  Medications: Prior to Admission medications   Medication Sig Start Date End Date Taking? Authorizing Provider    ALPRAZolam Duanne Moron) 0.5 MG tablet Take 1 tablet (0.5 mg total) by mouth 2 (two) times daily as needed for anxiety or sleep. 03/21/18  Yes [name], [name], MD  amLODipine (NORVASC) 10 MG tablet Take 0.5 tablets (5 mg total) by mouth daily. 05/24/18  Yes [name], [name], MD  anastrozole (ARIMIDEX) 1 MG tablet TAKE 1 TABLET BY MOUTH ONCE DAILY 04/03/18  Yes Curt Bears, MD  Cholecalciferol (VITAMIN D3) 1000 UNITS CAPS Take 1 capsule by mouth daily.    Yes [provider]  losartan-hydrochlorothiazide (HYZAAR) 100-25 MG tablet Take 1 tablet by mouth daily. 05/30/18  Yes [name], [name], MD  nystatin (MYCOSTATIN/NYSTOP) powder Apply 1 Dose topically daily. 04/06/18  Yes [provider]  palbociclib (IBRANCE) 100 MG capsule Take 1 capsule (100 mg total) by mouth daily with breakfast. Take whole with food. 04/18/18  Yes Curcio, Roselie Awkward, NP  meclizine (ANTIVERT) 12.5 MG tablet Take 1 tablet (12.5 mg total) by mouth 2 (two) times daily as needed for dizziness. 06/29/14   Sciacca, Marissa, PA-C  spironolactone (ALDACTONE) 50 MG tablet Take 1 tablet (50 mg total) by mouth daily. 07/13/16 02/07/18  [name], [name], MD  tiZANidine (ZANAFLEX) 4 MG tablet Take 4 mg by mouth every 6 (six) hours as needed for muscle spasms.    [provider]     Vital Signs: BP (!) 119/58   Pulse 88   Temp 98.3 F (36.8 C) (Oral)   Resp 16   Ht  5' 3"  (1.6 m)   Wt 205 lb (93 kg)   SpO2 97%   BMI 36.31 kg/m   Physical Exam awake, alert.  Chest clear to auscultation bilaterally.  Clean, intact left chest wall Port-A-Cath.  Heart with regular rate and rhythm.  Abdomen soft, positive bowel sounds, nontender.  No sig LE edema.  Imaging: No results found.  Labs:  CBC: Recent Labs    04/18/18 1447 05/16/18 0917 05/30/18 1036 06/19/18 1115  WBC 2.4* 1.5* 3.8* 5.4  HGB 11.1* 11.1* 11.3* 10.4*  HCT 33.4* 32.8* 34.0* 31.2*  PLT 175 135* 236 253    COAGS: Recent Labs     06/19/18 1115  INR 1.00  APTT 39*    BMP: Recent Labs    03/14/18 0820 04/18/18 1447 05/16/18 0917 05/30/18 1036  NA 140 140 140 141  K 3.8 4.2 3.6 4.2  CL 108 106 106 106  CO2 25 27 25 26   GLUCOSE 99 98 98 99  BUN 14 14 11 13   CALCIUM 9.3 9.3 9.1 9.5  CREATININE 1.24* 1.25* 1.25* 1.23*  GFRNONAA 41* 41* 41* 42*  GFRAA 48* 48* 48* 48*    LIVER FUNCTION TESTS: Recent Labs    03/14/18 0820 04/18/18 1447 05/16/18 0917 05/30/18 1036  BILITOT 0.5 0.5 0.7 0.5  AST 30 17 19 19   ALT 14 6 9 7   ALKPHOS 76 78 78 79  PROT 7.9 7.9 8.1 7.8  ALBUMIN 3.6 3.6 3.7 3.6    Assessment and Plan:  Pt with history of progressive metastatic breast carcinoma with recent imaging showing interval increase in size of multiple nodules throughout lungs, 3.1 cm mass within the hepatic dome, and increase in size of sclerotic lesions involving osseous skeleton.  She presents today for image guided liver lesion biopsy for further evaluation.Risks and benefits discussed with the patient/son including, but not limited to bleeding, infection, damage to adjacent structures or low yield requiring additional tests.  All of the patient's questions were answered, patient is agreeable to proceed. Consent signed and in chart.     Electronically Signed: D. Rowe Robert, PA-C 06/19/2018, 12:09 PM   I spent a total of 25 minutes at the the patient's bedside AND on the patient's hospital floor or unit, greater than 50% of which was counseling/coordinating care for image guided liver lesion biopsy

## 2018-06-22 NOTE — Progress Notes (Signed)
FMLA for son, [name] [name], mailed to son at [address] [place] [address]. No fax number provided to fax forms to.

## 2018-06-26 ENCOUNTER — Encounter: Payer: Self-pay | Admitting: Hematology and Oncology

## 2018-06-28 ENCOUNTER — Other Ambulatory Visit: Payer: Medicare Other

## 2018-06-28 ENCOUNTER — Ambulatory Visit: Payer: Medicare Other

## 2018-06-29 ENCOUNTER — Telehealth: Payer: Self-pay | Admitting: Hematology and Oncology

## 2018-06-29 ENCOUNTER — Inpatient Hospital Stay (HOSPITAL_BASED_OUTPATIENT_CLINIC_OR_DEPARTMENT_OTHER): Payer: Medicare HMO | Admitting: Hematology and Oncology

## 2018-06-29 DIAGNOSIS — C787 Secondary malignant neoplasm of liver and intrahepatic bile duct: Secondary | ICD-10-CM

## 2018-06-29 DIAGNOSIS — Z79899 Other long term (current) drug therapy: Secondary | ICD-10-CM

## 2018-06-29 DIAGNOSIS — Z79811 Long term (current) use of aromatase inhibitors: Secondary | ICD-10-CM

## 2018-06-29 DIAGNOSIS — C7951 Secondary malignant neoplasm of bone: Secondary | ICD-10-CM | POA: Diagnosis not present

## 2018-06-29 DIAGNOSIS — Z170 Estrogen receptor positive status [ER+]: Secondary | ICD-10-CM | POA: Diagnosis not present

## 2018-06-29 DIAGNOSIS — Z9221 Personal history of antineoplastic chemotherapy: Secondary | ICD-10-CM

## 2018-06-29 DIAGNOSIS — C50911 Malignant neoplasm of unspecified site of right female breast: Secondary | ICD-10-CM | POA: Diagnosis not present

## 2018-06-29 DIAGNOSIS — C773 Secondary and unspecified malignant neoplasm of axilla and upper limb lymph nodes: Secondary | ICD-10-CM

## 2018-06-29 MED ORDER — PALBOCICLIB 100 MG PO CAPS: 100.0000 mg | ORAL_CAPSULE | Freq: Every day | ORAL | 2 refills | Status: DC

## 2018-06-29 MED FILL — IBRANCE 100 MG CAPSULE: 100 | 28 days supply | Qty: 21 | Fill #1

## 2018-06-29 NOTE — Assessment & Plan Note (Signed)
Metastatic breast cancer with bone metastases along with lung and liver metastases, progressed on Ibrance with anastrozole.  Radiology review: 05/23/2018 CT CAP: New liver metastases 3.1 cm, increase in lung nodules, increase in sclerotic lesion of T11 and T9 and S1 vertebral body. Liver biopsy 06/19/2018: Metastatic breast cancer ER 100%, PR 0%, Ki-67 50%, HER-2 negative BRCA1 mutation: Negative  PDL 1 and foundation 1 are pending  Current treatment: Faslodex with Ibrance starting 06/29/2018, Xgeva every 3 months Plans to do this treatment for 2 months and recheck with a CT scan. If there is any evidence of progression of disease then we will switch her treatment.  Return to clinic in 2 weeks for second dose of Faslodex.  I will see her back in 1 month.

## 2018-06-29 NOTE — Telephone Encounter (Signed)
Gave avs and calendar ° °

## 2018-06-29 NOTE — Progress Notes (Signed)
Patient Care Team: [name], [name], MD as PCP - General (Internal Medicine) Curt Bears, MD (Hematology and Oncology) Paralee Cancel, MD (Orthopedic Surgery) Tyler Pita, MD (Radiation Oncology)  DIAGNOSIS:  Encounter Diagnosis  Name Primary?  . Breast cancer metastasized to bone, right ([place])     SUMMARY OF ONCOLOGIC HISTORY:   Breast cancer metastasized to bone, right ([place])   05/22/2001 Initial Diagnosis    Rt breast: stage IIb (T2, N1, M0) invasive right breast carcinoma with positive estrogen and progesterone receptor as well as HER-2/neu    06/12/2001 Surgery    right lumpectomy with right axillary lymph node dissection revealing 4/9 lymph nodes that were positive for malignancy.     08/12/2001 - 01/10/2002 Chemotherapy    Adj chemo CMF X 8     05/07/2002 - 12/14/2007 Anti-estrogen oral therapy    Adj Tamoxifen for 2 years and then switched to Letrozole (by Dr.Khan in Aug 2005), stopped Feb 2009    02/11/2003 - 04/05/2003 Radiation Therapy    Adj XRT by Tammi Klippel    03/18/2011 -  Anti-estrogen oral therapy    Anastrozole 1 mg daily    04/01/2011 Surgery    Left total hip replacement secondary to pathologic left femoral neck fracture    04/25/2017 Relapse/Recurrence    Anastrozole with Ibrance discontinued 05/30/2018 due to disease progression, Xgeva for bone metastases every 3 months    06/19/2018 Procedure    Liver biopsy: Metastatic breast cancer, ER 100%, PR 0%, Ki-67 50%, HER-2 negative     CHIEF COMPLIANT: Follow-up to discuss liver biopsy result  INTERVAL HISTORY: [name] [name] is a 77 year old with above-mentioned history of metastatic breast cancer who was being treated by Dr. Julien Nordmann and I have assumed her care because of the recent findings of progression of liver metastases on Ibrance with anastrozole.  Liver biopsy was performed and she is here today to discuss pathology report and to discuss her treatment plan.  The biopsy came back as metastatic  breast cancer that is ER +100% PR negative and HER-2 negative.  REVIEW OF SYSTEMS:   Constitutional: Denies fevers, chills or abnormal weight loss Eyes: Denies blurriness of vision Ears, nose, mouth, throat, and face: Denies mucositis or sore throat Respiratory: Denies cough, dyspnea or wheezes Cardiovascular: Denies palpitation, chest discomfort Gastrointestinal:  Denies nausea, heartburn or change in bowel habits Skin: Denies abnormal skin rashes Lymphatics: Denies new lymphadenopathy or easy bruising Neurological:Denies numbness, tingling or new weaknesses Behavioral/Psych: Mood is stable, no new changes  Extremities: No lower extremity edema  All other systems were reviewed with the patient and are negative.  I have reviewed the past medical history, past surgical history, social history and family history with the patient and they are unchanged from previous note.  ALLERGIES:  has No Known Allergies.  MEDICATIONS:  Current Outpatient Medications  Medication Sig Dispense Refill  . ALPRAZolam (XANAX) 0.5 MG tablet Take 1 tablet (0.5 mg total) by mouth 2 (two) times daily as needed for anxiety or sleep. 30 tablet 3  . amLODipine (NORVASC) 10 MG tablet Take 0.5 tablets (5 mg total) by mouth daily. 90 tablet 1  . anastrozole (ARIMIDEX) 1 MG tablet TAKE 1 TABLET BY MOUTH ONCE DAILY 90 tablet 2  . Cholecalciferol (VITAMIN D3) 1000 UNITS CAPS Take 1 capsule by mouth daily.     Marland Kitchen losartan-hydrochlorothiazide (HYZAAR) 100-25 MG tablet Take 1 tablet by mouth daily. 90 tablet 3  . meclizine (ANTIVERT) 12.5 MG tablet Take 1 tablet (12.5  mg total) by mouth 2 (two) times daily as needed for dizziness. 10 tablet 0  . nystatin (MYCOSTATIN/NYSTOP) powder Apply 1 Dose topically daily.  3  . palbociclib (IBRANCE) 100 MG capsule Take 1 capsule (100 mg total) by mouth daily with breakfast. Take whole with food. 21 capsule 2  . spironolactone (ALDACTONE) 50 MG tablet Take 1 tablet (50 mg total) by mouth  daily. 90 tablet 3  . tiZANidine (ZANAFLEX) 4 MG tablet Take 4 mg by mouth every 6 (six) hours as needed for muscle spasms.     No current facility-administered medications for this visit.    Facility-Administered Medications Ordered in Other Visits  Medication Dose Route Frequency Provider Last Rate Last Dose  . alteplase (CATHFLO ACTIVASE) injection 2 mg  2 mg Intracatheter Once PRN Curt Bears, MD      . denosumab (XGEVA) injection 120 mg  120 mg Subcutaneous Once Curt Bears, MD        PHYSICAL EXAMINATION: ECOG PERFORMANCE STATUS: 1 - Symptomatic but completely ambulatory  Vitals:   06/29/18 0855  BP: (!) 112/58  Pulse: 93  Resp: 17  Temp: 97.7 F (36.5 C)  SpO2: 100%   Filed Weights   06/29/18 0855  Weight: 202 lb 9.6 oz (91.9 kg)    GENERAL:alert, no distress and comfortable SKIN: skin color, texture, turgor are normal, no rashes or significant lesions EYES: normal, Conjunctiva are pink and non-injected, sclera clear OROPHARYNX:no exudate, no erythema and lips, buccal mucosa, and tongue normal  NECK: supple, thyroid normal size, non-tender, without nodularity LYMPH:  no palpable lymphadenopathy in the cervical, axillary or inguinal LUNGS: clear to auscultation and percussion with normal breathing effort HEART: regular rate & rhythm and no murmurs and no lower extremity edema ABDOMEN:abdomen soft, non-tender and normal bowel sounds MUSCULOSKELETAL:no cyanosis of digits and no clubbing  NEURO: alert & oriented x 3 with fluent speech, no focal motor/sensory deficits EXTREMITIES: No lower extremity edema   LABORATORY DATA:  I have reviewed the data as listed CMP Latest Ref Rng & Units 05/30/2018 05/16/2018 04/18/2018  Glucose 70 - 99 mg/dL 99 98 98  BUN 8 - 23 mg/dL 13 11 14   Creatinine 0.44 - 1.00 mg/dL 1.23(H) 1.25(H) 1.25(H)  Sodium 135 - 145 mmol/L 141 140 140  Potassium 3.5 - 5.1 mmol/L 4.2 3.6 4.2  Chloride 98 - 111 mmol/L 106 106 106  CO2 22 - 32  mmol/L 26 25 27   Calcium 8.9 - 10.3 mg/dL 9.5 9.1 9.3  Total Protein 6.5 - 8.1 g/dL 7.8 8.1 7.9  Total Bilirubin 0.3 - 1.2 mg/dL 0.5 0.7 0.5  Alkaline Phos 38 - 126 U/L 79 78 78  AST 15 - 41 U/L 19 19 17   ALT 0 - 44 U/L 7 9 6     Lab Results  Component Value Date   WBC 5.4 06/19/2018   HGB 10.4 (L) 06/19/2018   HCT 31.2 (L) 06/19/2018   MCV 106.1 (H) 06/19/2018   PLT 253 06/19/2018   NEUTROABS 2.2 05/30/2018    ASSESSMENT & PLAN:  Breast cancer metastasized to bone, right ([facility]) Metastatic breast cancer with bone metastases along with lung and liver metastases, progressed on Ibrance with anastrozole.  Radiology review: 05/23/2018 CT CAP: New liver metastases 3.1 cm, increase in lung nodules, increase in sclerotic lesion of T11 and T9 and S1 vertebral body. Liver biopsy 06/19/2018: Metastatic breast cancer ER 100%, PR 0%, Ki-67 50%, HER-2 negative BRCA1 mutation: Negative  PDL 1 and foundation 1 are  pending  Current treatment: Faslodex with Ibrance starting 06/29/2018, Xgeva every 3 months, will start next week Plans to do this treatment for 2 months and recheck with a CT scan. If there is any evidence of progression of disease then we will switch her treatment.  Return to clinic in 2 weeks after the first dose of Faslodex for second dose of Faslodex.  I will see her back in 1 month in conjunction with her appointments.    Orders Placed This Encounter  Procedures  . CBC with Differential (Cancer Center Only)    Standing Status:   Future    Standing Expiration Date:   06/30/2019  . CMP ([facility] only)    Standing Status:   Future    Standing Expiration Date:   06/30/2019   The patient has a good understanding of the overall plan. she agrees with it. she will call with any problems that may develop before the next visit here.   Harriette Ohara, MD 06/29/18

## 2018-07-04 ENCOUNTER — Telehealth: Payer: Self-pay

## 2018-07-04 NOTE — Telephone Encounter (Signed)
Called patient to verify he appointments as requested. Per 9/3 phone msg returns

## 2018-07-06 ENCOUNTER — Inpatient Hospital Stay: Payer: Medicare HMO

## 2018-07-06 ENCOUNTER — Other Ambulatory Visit: Payer: Self-pay | Admitting: Hematology and Oncology

## 2018-07-06 ENCOUNTER — Inpatient Hospital Stay: Payer: Medicare HMO | Attending: Oncology

## 2018-07-06 ENCOUNTER — Ambulatory Visit: Payer: Medicare HMO

## 2018-07-06 DIAGNOSIS — Z79899 Other long term (current) drug therapy: Secondary | ICD-10-CM | POA: Insufficient documentation

## 2018-07-06 DIAGNOSIS — Z923 Personal history of irradiation: Secondary | ICD-10-CM | POA: Diagnosis not present

## 2018-07-06 DIAGNOSIS — C7800 Secondary malignant neoplasm of unspecified lung: Secondary | ICD-10-CM | POA: Diagnosis not present

## 2018-07-06 DIAGNOSIS — C787 Secondary malignant neoplasm of liver and intrahepatic bile duct: Secondary | ICD-10-CM | POA: Insufficient documentation

## 2018-07-06 DIAGNOSIS — Z170 Estrogen receptor positive status [ER+]: Secondary | ICD-10-CM | POA: Diagnosis not present

## 2018-07-06 DIAGNOSIS — Z95828 Presence of other vascular implants and grafts: Secondary | ICD-10-CM

## 2018-07-06 DIAGNOSIS — Z9221 Personal history of antineoplastic chemotherapy: Secondary | ICD-10-CM | POA: Insufficient documentation

## 2018-07-06 DIAGNOSIS — Z79818 Long term (current) use of other agents affecting estrogen receptors and estrogen levels: Secondary | ICD-10-CM | POA: Insufficient documentation

## 2018-07-06 DIAGNOSIS — C7951 Secondary malignant neoplasm of bone: Secondary | ICD-10-CM | POA: Diagnosis not present

## 2018-07-06 DIAGNOSIS — C50911 Malignant neoplasm of unspecified site of right female breast: Secondary | ICD-10-CM | POA: Insufficient documentation

## 2018-07-06 LAB — CBC WITH DIFFERENTIAL (CANCER CENTER ONLY)
BASOS ABS: 0 10*3/uL (ref 0.0–0.1)
Basophils Relative: 1 %
EOS PCT: 4 %
Eosinophils Absolute: 0.2 10*3/uL (ref 0.0–0.5)
HEMATOCRIT: 33.7 % — AB (ref 34.8–46.6)
HEMOGLOBIN: 11 g/dL — AB (ref 11.6–15.9)
LYMPHS ABS: 0.6 10*3/uL — AB (ref 0.9–3.3)
LYMPHS PCT: 15 %
MCH: 33.8 pg (ref 25.1–34.0)
MCHC: 32.8 g/dL (ref 31.5–36.0)
MCV: 103.2 fL — AB (ref 79.5–101.0)
Monocytes Absolute: 0.4 10*3/uL (ref 0.1–0.9)
Monocytes Relative: 9 %
NEUTROS ABS: 3.1 10*3/uL (ref 1.5–6.5)
NEUTROS PCT: 71 %
Platelet Count: 228 10*3/uL (ref 145–400)
RBC: 3.26 MIL/uL — AB (ref 3.70–5.45)
RDW: 15.6 % — ABNORMAL HIGH (ref 11.2–14.5)
WBC: 4.4 10*3/uL (ref 3.9–10.3)

## 2018-07-06 LAB — CMP (CANCER CENTER ONLY)
ALT: <6 U/L (ref 0–44)
AST: 11 U/L — AB (ref 15–41)
Albumin: 3.3 g/dL — ABNORMAL LOW (ref 3.5–5.0)
Alkaline Phosphatase: 83 U/L (ref 38–126)
Anion gap: 10 (ref 5–15)
BILIRUBIN TOTAL: 0.5 mg/dL (ref 0.3–1.2)
BUN: 28 mg/dL — AB (ref 8–23)
CHLORIDE: 105 mmol/L (ref 98–111)
CO2: 23 mmol/L (ref 22–32)
Calcium: 9.3 mg/dL (ref 8.9–10.3)
Creatinine: 2.7 mg/dL — ABNORMAL HIGH (ref 0.44–1.00)
GFR, EST AFRICAN AMERICAN: 19 mL/min — AB (ref >60)
GFR, EST NON AFRICAN AMERICAN: 16 mL/min — AB (ref >60)
Glucose, Bld: 102 mg/dL — ABNORMAL HIGH (ref 70–99)
POTASSIUM: 4.8 mmol/L (ref 3.5–5.1)
Sodium: 138 mmol/L (ref 135–145)
TOTAL PROTEIN: 7.9 g/dL (ref 6.5–8.1)

## 2018-07-06 MED ORDER — FULVESTRANT 250 MG/5ML IM SOLN
500.0000 mg | Freq: Once | INTRAMUSCULAR | Status: AC
  Administered 2018-07-06: 500 mg via INTRAMUSCULAR

## 2018-07-06 NOTE — Patient Instructions (Signed)

## 2018-07-07 ENCOUNTER — Telehealth: Payer: Self-pay | Admitting: Hematology and Oncology

## 2018-07-07 NOTE — Telephone Encounter (Signed)
Scheduled appt per 9/5 sch message - patient is aware of appt date and time.

## 2018-07-10 ENCOUNTER — Encounter (HOSPITAL_COMMUNITY): Payer: Self-pay | Admitting: Hematology and Oncology

## 2018-07-11 ENCOUNTER — Ambulatory Visit: Payer: Self-pay

## 2018-07-11 ENCOUNTER — Ambulatory Visit: Payer: Medicare HMO | Admitting: Internal Medicine

## 2018-07-11 ENCOUNTER — Telehealth: Payer: Self-pay | Admitting: Hematology and Oncology

## 2018-07-11 ENCOUNTER — Other Ambulatory Visit: Payer: Self-pay

## 2018-07-12 NOTE — Telephone Encounter (Signed)
error 

## 2018-07-19 ENCOUNTER — Other Ambulatory Visit: Payer: Self-pay

## 2018-07-19 DIAGNOSIS — C7951 Secondary malignant neoplasm of bone: Principal | ICD-10-CM

## 2018-07-19 DIAGNOSIS — C50911 Malignant neoplasm of unspecified site of right female breast: Secondary | ICD-10-CM

## 2018-07-19 NOTE — Progress Notes (Addendum)
Subjective:   [name] [name] is a 77 y.o. female who presents for Medicare Annual (Subsequent) preventive examination.  Review of Systems:  No ROS.  Medicare Wellness Visit. Additional risk factors are reflected in the social history.  Cardiac Risk Factors include: advanced age (>74mn, >>80women);diabetes mellitus;dyslipidemia;hypertension Sleep patterns: has restless sleep, has frequent nighttime awakenings, gets up 2 times nightly to void and sleeps 5-6 hours nightly. Patient reports insomnia issues, discussed recommended sleep tips.   Home Safety/Smoke Alarms: Feels safe in home. Smoke alarms in place.  Living environment; residence and [facility]ult [facility] apartment, equipment: Walkers, Type: rWellsite geologist Type: Tub SSurveyor, quantity no firearms. Lives alone, no needs for DME, limited support system.  Seat Belt Safety/Bike Helmet: Wears seat belt.      Objective:     Vitals: BP (!) 144/72   Pulse 79   Resp 17   Ht 5' 3"  (1.6 m)   Wt 207 lb (93.9 kg)   SpO2 98%   BMI 36.67 kg/m   Body mass index is 36.67 kg/m.  Advanced Directives 07/20/2018 06/19/2018 05/30/2018 03/14/2018 01/05/2018 12/08/2017 07/14/2017  Does Patient Have a Medical Advance Directive? No No No No No No No  Would patient like information on creating a medical advance directive? Yes (ED - Information included in AVS) No - Patient declined No - Patient declined No - Patient declined No - Patient declined No - Patient declined -    Tobacco Social History   Tobacco Use  Smoking Status Former Smoker  . Packs/day: 1.00  . Years: 40.00  . Pack years: 40.00  . Types: Cigarettes  . Last attempt to quit: 02/12/2011  . Years since quitting: 7.4  Smokeless Tobacco Never Used     Counseling given: Not Answered  Past Medical History:  Diagnosis Date  . Anxiety    hx of  . Asthma   . [place]    [place]; metastatic in 2012, right breast, spread to left hip in 2012  . Cerebrovascular accident ([facility]     R thalamic CVA 01/2010  . Chemotherapy induced neutropenia ([facility] 08/15/2017  . Dyslipidemia   . Encounter for therapeutic drug monitoring 04/14/2017  . Goals of care, counseling/discussion 04/19/2017  . Hyperlipemia   . Hypertension   . Low back pain   . Osteoarthritis   . TIA (transient ischemic attack)    x2 in 2011  . Vertigo 2014   Past Surgical History:  Procedure Laterality Date  . ABDOMINAL HYSTERECTOMY     complete  . BREAST LUMPECTOMY     Right breast 2002  . JOINT REPLACEMENT  2012   L hip due to breast ca met  . TOTAL HIP ARTHROPLASTY Right 10/28/2015   Procedure: RIGHT TOTAL HIP ARTHROPLASTY ANTERIOR APPROACH;  Surgeon: MParalee Cancel MD;  Location: [facility];  Service: Orthopedics;  Laterality: Right;   Family History  Problem Relation Age of Onset  . Hypertension Mother   . Hypertension Father   . Cancer Neg Hx    Social History   Socioeconomic History  . Marital status: Divorced    Spouse name: Not on file  . Number of children: 1  . Years of education: Not on file  . Highest education level: Not on file  Occupational History  . Not on file  Social Needs  . Financial resource strain: Not hard at all  . Food insecurity:    Worry: Never true    Inability: Never true  . Transportation  needs:    Medical: No    Non-medical: No  Tobacco Use  . Smoking status: Former Smoker    Packs/day: 1.00    Years: 40.00    Pack years: 40.00    Types: Cigarettes    Last attempt to quit: 02/12/2011    Years since quitting: 7.4  . Smokeless tobacco: Never Used  Substance and Sexual Activity  . Alcohol use: Yes    Comment: sometimes daily, some occasional  . Drug use: No  . Sexual activity: Never  Lifestyle  . Physical activity:    Days per week: 0 days    Minutes per session: 0 min  . Stress: Only a little  Relationships  . Social connections:    Talks on phone: More than three times a week    Gets together: More than three times a week    Attends religious  service: Never    Active member of club or organization: Yes    Attends meetings of clubs or organizations: More than 4 times per year    Relationship status: Divorced  Other Topics Concern  . Not on file  Social History Narrative   Occupation: Network engineer at [employer]   Current smoker   single    Outpatient Encounter Medications as of 07/20/2018  Medication Sig  . amLODipine (NORVASC) 10 MG tablet Take 0.5 tablets (5 mg total) by mouth daily.  Marland Kitchen anastrozole (ARIMIDEX) 1 MG tablet TAKE 1 TABLET BY MOUTH ONCE DAILY  . Cholecalciferol (VITAMIN D3) 1000 UNITS CAPS Take 1 capsule by mouth daily.   Marland Kitchen losartan-hydrochlorothiazide (HYZAAR) 100-25 MG tablet Take 1 tablet by mouth daily.  . meclizine (ANTIVERT) 12.5 MG tablet Take 1 tablet (12.5 mg total) by mouth 2 (two) times daily as needed for dizziness.  . nystatin (MYCOSTATIN/NYSTOP) powder Apply 1 Dose topically daily.  . palbociclib (IBRANCE) 100 MG capsule Take 1 capsule (100 mg total) by mouth daily with breakfast. Take whole with food.  Marland Kitchen tiZANidine (ZANAFLEX) 4 MG tablet Take 4 mg by mouth every 6 (six) hours as needed for muscle spasms.  . [DISCONTINUED] ALPRAZolam (XANAX) 0.5 MG tablet Take 1 tablet (0.5 mg total) by mouth 2 (two) times daily as needed for anxiety or sleep.   Facility-Administered Encounter Medications as of 07/20/2018  Medication  . alteplase (CATHFLO ACTIVASE) injection 2 mg  . denosumab (XGEVA) injection 120 mg    Activities of Daily Living In your present state of health, do you have any difficulty performing the following activities: 07/20/2018 06/19/2018  Hearing? N N  Vision? N N  Difficulty concentrating or making decisions? N N  Walking or climbing stairs? Y Y  Dressing or bathing? N N  Doing errands, shopping? Y -  Conservation officer, nature and eating ? Y -  Using the Toilet? N -  In the past six months, have you accidently leaked urine? Y -  Do you have problems with loss of bowel control? Y -  Managing your  Medications? N -  Managing your Finances? N -  Housekeeping or managing your Housekeeping? Y -  Some recent data might be hidden    Patient Care Team: [name], [name], MD as PCP - General (Internal Medicine) Curt Bears, MD (Hematology and Oncology) Paralee Cancel, MD (Orthopedic Surgery) Tyler Pita, MD (Radiation Oncology)    Assessment:   This is a routine wellness examination for Taelynn. Physical assessment deferred to PCP.   Exercise Activities and Dietary recommendations Current Exercise Habits: The patient does  not participate in regular exercise at present, Exercise limited by: orthopedic condition(s)  Diet (meal preparation, eat out, water intake, caffeinated beverages, dairy products, fruits and vegetables): in general, a "healthy" diet  . Reports poor appetite.  Reviewed heart healthy and diabetic diet. Encouraged patient to increase daily water and healthy fluid intake. Discussed drinking nutritional supplements, samples and coupons provided.  Goals    . increase my strength so I can attend football games    . Patient Stated     Maintain current health status, I will increase the amount of water I drink. I will keep water at my side and drink at least 2-3 bottles a day.        Fall Risk Fall Risk  07/20/2018 06/13/2017 05/02/2017 04/13/2016 09/11/2014  Falls in the past year? No No No No No  Risk for fall due to : Impaired balance/gait;Impaired mobility - - - Impaired balance/gait;Impaired mobility  Risk for fall due to: Comment - - - - has walker   Depression Screen PHQ 2/9 Scores 07/20/2018 06/13/2017 05/02/2017 04/13/2016  PHQ - 2 Score 2 1 0 0  PHQ- 9 Score 9 3 - -     Cognitive Function MMSE - Mini Mental State Exam 07/20/2018 06/13/2017  Orientation to time 5 5  Orientation to Place 5 5  Registration 3 3  Attention/ Calculation 4 5  Recall 2 2  Language- name 2 objects 2 2  Language- repeat 1 1  Language- follow 3 step command 3 3  Language-  read & follow direction 1 1  Write a sentence 1 1  Copy design 1 1  Total score 28 29        Immunization History  Administered Date(s) Administered  . Influenza Whole 07/03/2011  . Influenza, High Dose Seasonal PF 08/26/2016, 07/20/2018  . Influenza,inj,Quad PF,6+ Mos 07/06/2013, 07/12/2014, 07/28/2015, 08/15/2017  . Pneumococcal Conjugate-13 04/04/2017  . Pneumococcal Polysaccharide-23 08/01/2006   Screening Tests Health Maintenance  Topic Date Due  . OPHTHALMOLOGY EXAM  01/22/1951  . TETANUS/TDAP  01/22/1962  . COLONOSCOPY  05/06/2013  . FOOT EXAM  07/13/2017  . HEMOGLOBIN A1C  12/14/2017  . PNA vac Low Risk Adult (2 of 2 - PPSV23) 04/04/2018  . DEXA SCAN  07/21/2019 (Originally 01/23/2008)  . INFLUENZA VACCINE  Completed      Plan:     [facility] referral for health maintenance support due to metastatic breast cancer, diabetes, and community resources. Patient a limited support system.  Continue doing brain stimulating activities (puzzles, reading, adult coloring books, staying active) to keep memory sharp.   Continue to eat heart healthy diet (full of fruits, vegetables, whole grains, lean protein, water--limit salt, fat, and sugar intake) and increase physical activity as tolerated.  I have personally reviewed and noted the following in the patient's chart:   . Medical and social history . Use of alcohol, tobacco or illicit drugs  . Current medications and supplements . Functional ability and status . Nutritional status . Physical activity . Advanced directives . List of other physicians . Vitals . Screenings to include cognitive, depression, and falls . Referrals and appointments  In addition, I have reviewed and discussed with patient certain preventive protocols, quality metrics, and best practice recommendations. A written personalized care plan for preventive services as well as general preventive health recommendations were provided to patient.     Michiel Cowboy, RN  07/20/2018  Medical screening examination/treatment/procedure(s) were performed by non-physician practitioner and as supervising physician I  was immediately available for consultation/collaboration. I agree with above. Lew Dawes, MD

## 2018-07-20 ENCOUNTER — Inpatient Hospital Stay: Payer: Medicare HMO

## 2018-07-20 ENCOUNTER — Telehealth: Payer: Self-pay | Admitting: Hematology and Oncology

## 2018-07-20 ENCOUNTER — Other Ambulatory Visit: Payer: Medicare HMO

## 2018-07-20 ENCOUNTER — Inpatient Hospital Stay (HOSPITAL_BASED_OUTPATIENT_CLINIC_OR_DEPARTMENT_OTHER): Payer: Medicare HMO | Admitting: Hematology and Oncology

## 2018-07-20 ENCOUNTER — Ambulatory Visit: Payer: Medicare HMO

## 2018-07-20 ENCOUNTER — Ambulatory Visit: Payer: Medicare HMO | Admitting: Hematology and Oncology

## 2018-07-20 ENCOUNTER — Other Ambulatory Visit: Payer: Self-pay | Admitting: *Deleted

## 2018-07-20 ENCOUNTER — Ambulatory Visit (INDEPENDENT_AMBULATORY_CARE_PROVIDER_SITE_OTHER): Payer: Medicare HMO | Admitting: *Deleted

## 2018-07-20 ENCOUNTER — Other Ambulatory Visit: Payer: Self-pay | Admitting: Internal Medicine

## 2018-07-20 VITALS — BP 144/72 | HR 79 | Resp 17 | Ht 63.0 in | Wt 207.0 lb

## 2018-07-20 DIAGNOSIS — Z923 Personal history of irradiation: Secondary | ICD-10-CM | POA: Diagnosis not present

## 2018-07-20 DIAGNOSIS — C7951 Secondary malignant neoplasm of bone: Secondary | ICD-10-CM

## 2018-07-20 DIAGNOSIS — C7800 Secondary malignant neoplasm of unspecified lung: Secondary | ICD-10-CM | POA: Diagnosis not present

## 2018-07-20 DIAGNOSIS — C50911 Malignant neoplasm of unspecified site of right female breast: Secondary | ICD-10-CM | POA: Diagnosis not present

## 2018-07-20 DIAGNOSIS — Z79899 Other long term (current) drug therapy: Secondary | ICD-10-CM

## 2018-07-20 DIAGNOSIS — C787 Secondary malignant neoplasm of liver and intrahepatic bile duct: Secondary | ICD-10-CM

## 2018-07-20 DIAGNOSIS — Z170 Estrogen receptor positive status [ER+]: Secondary | ICD-10-CM | POA: Diagnosis not present

## 2018-07-20 DIAGNOSIS — Z9221 Personal history of antineoplastic chemotherapy: Secondary | ICD-10-CM | POA: Diagnosis not present

## 2018-07-20 DIAGNOSIS — Z0000 Encounter for general adult medical examination without abnormal findings: Secondary | ICD-10-CM

## 2018-07-20 DIAGNOSIS — Z23 Encounter for immunization: Secondary | ICD-10-CM

## 2018-07-20 DIAGNOSIS — Z79818 Long term (current) use of other agents affecting estrogen receptors and estrogen levels: Secondary | ICD-10-CM

## 2018-07-20 DIAGNOSIS — Z95828 Presence of other vascular implants and grafts: Secondary | ICD-10-CM

## 2018-07-20 LAB — CMP (CANCER CENTER ONLY)
ANION GAP: 8 (ref 5–15)
AST: 14 U/L — ABNORMAL LOW (ref 15–41)
Albumin: 3.5 g/dL (ref 3.5–5.0)
Alkaline Phosphatase: 82 U/L (ref 38–126)
BUN: 29 mg/dL — ABNORMAL HIGH (ref 8–23)
CHLORIDE: 106 mmol/L (ref 98–111)
CO2: 26 mmol/L (ref 22–32)
CREATININE: 2.33 mg/dL — AB (ref 0.44–1.00)
Calcium: 9.4 mg/dL (ref 8.9–10.3)
GFR, EST AFRICAN AMERICAN: 22 mL/min — AB (ref >60)
GFR, EST NON AFRICAN AMERICAN: 19 mL/min — AB (ref >60)
Glucose, Bld: 106 mg/dL — ABNORMAL HIGH (ref 70–99)
Potassium: 4.4 mmol/L (ref 3.5–5.1)
Sodium: 140 mmol/L (ref 135–145)
Total Bilirubin: 0.5 mg/dL (ref 0.3–1.2)
Total Protein: 8 g/dL (ref 6.5–8.1)

## 2018-07-20 LAB — CBC WITH DIFFERENTIAL (CANCER CENTER ONLY)
BASOS PCT: 1 %
Basophils Absolute: 0 10*3/uL (ref 0.0–0.1)
EOS ABS: 0 10*3/uL (ref 0.0–0.5)
Eosinophils Relative: 1 %
HEMATOCRIT: 30.4 % — AB (ref 34.8–46.6)
Hemoglobin: 10.1 g/dL — ABNORMAL LOW (ref 11.6–15.9)
LYMPHS ABS: 0.5 10*3/uL — AB (ref 0.9–3.3)
Lymphocytes Relative: 31 %
MCH: 33.5 pg (ref 25.1–34.0)
MCHC: 33.1 g/dL (ref 31.5–36.0)
MCV: 101.2 fL — AB (ref 79.5–101.0)
Monocytes Absolute: 0.1 10*3/uL (ref 0.1–0.9)
Monocytes Relative: 5 %
NEUTROS PCT: 62 %
Neutro Abs: 1.1 10*3/uL — ABNORMAL LOW (ref 1.5–6.5)
PLATELETS: 102 10*3/uL — AB (ref 145–400)
RBC: 3.01 MIL/uL — ABNORMAL LOW (ref 3.70–5.45)
RDW: 16.4 % — AB (ref 11.2–14.5)
WBC Count: 1.8 10*3/uL — ABNORMAL LOW (ref 3.9–10.3)

## 2018-07-20 MED ORDER — ALPRAZOLAM 0.5 MG PO TABS: 0.5000 mg | ORAL_TABLET | Freq: Two times a day (BID) | ORAL | 3 refills | Status: DC | PRN

## 2018-07-20 MED ORDER — FULVESTRANT 250 MG/5ML IM SOLN
500.0000 mg | Freq: Once | INTRAMUSCULAR | Status: AC
  Administered 2018-07-20: 500 mg via INTRAMUSCULAR

## 2018-07-20 MED ORDER — DENOSUMAB 120 MG/1.7ML ~~LOC~~ SOLN: 120.0000 mg | Freq: Once | SUBCUTANEOUS | Status: DC

## 2018-07-20 MED ORDER — FULVESTRANT 250 MG/5ML IM SOLN
INTRAMUSCULAR | Status: AC
  Filled 2018-07-20: qty 5

## 2018-07-20 MED FILL — ALPRAZolam 0.5 MG TABS: 0.5 | 30 days supply | Qty: 60 | Fill #0

## 2018-07-20 NOTE — Assessment & Plan Note (Signed)
Metastatic breast cancer with bone metastases along with lung and liver metastases, progressed on Ibrance with anastrozole.  Radiology review: 05/23/2018 CT CAP:New liver metastases 3.1 cm,increase in lung nodules, increase in sclerotic lesion of T11 and T9and S1 vertebral body. Liver biopsy 06/19/2018: Metastatic breast cancer ER 100%, PR 0%, Ki-67 50%, HER-2 negative BRCA1 mutation: Negative  PDL 1 and foundation 1 are pending  Current treatment: Faslodex with Ibrance started 06/29/2018, Xgeva every 3 months.   Plans to do see her back in 1 month with scans.

## 2018-07-20 NOTE — Telephone Encounter (Signed)
Gave avs and calendar ° °

## 2018-07-20 NOTE — Patient Instructions (Addendum)

## 2018-07-20 NOTE — Progress Notes (Signed)
Pt. Here today for injection Pt. To receive faslodex and xgeva. TC from [place] in pharmacy stating Pt can not receive xgeva anymore because insurance will not pay for it. Pt will have to get zometa. Dr. Lindi Adie informed about this situation and stated Pt. Will get faslodex injection today. And will put in plan for Pt  To receive zometa next month. Pt. Verbalized understanding.

## 2018-07-20 NOTE — Patient Instructions (Addendum)
Lifeline: http://www.lifelinesys.com/content/home; [phone] [phone]   Continue doing brain stimulating activities (puzzles, reading, adult coloring books, staying active) to keep memory sharp.   Continue to eat heart healthy diet (full of fruits, vegetables, whole grains, lean protein, water--limit salt, fat, and sugar intake) and increase physical activity as tolerated.   Ms. [name] [name] , Thank you for taking time to come for your Medicare Wellness Visit. I appreciate your ongoing commitment to your health goals. Please review the following plan we discussed and let me know if I can assist you in the future.   These are the goals we discussed: Goals    . increase my strength so I can attend football games    . Patient Stated     Maintain current health status, I will increase the amount of water I drink. I will keep water at my side and drink at least 2-3 bottles a day.        This is a list of the screening recommended for you and due dates:  Health Maintenance  Topic Date Due  . Eye exam for diabetics  01/22/1951  . Tetanus Vaccine  01/22/1962  . DEXA scan (bone density measurement)  01/23/2008  . Colon Cancer Screening  05/06/2013  . Complete foot exam   07/13/2017  . Hemoglobin A1C  12/14/2017  . Pneumonia vaccines (2 of 2 - PPSV23) 04/04/2018  . Flu Shot  06/01/2018   Influenza Virus Vaccine injection What is this medicine? INFLUENZA VIRUS VACCINE (in floo EN zuh VAHY ruhs vak SEEN) helps to reduce the risk of getting influenza also known as the flu. The vaccine only helps protect you against some strains of the flu. This medicine may be used for other purposes; ask your health care provider or pharmacist if you have questions. COMMON BRAND NAME(S): Afluria, Agriflu, Alfuria, FLUAD, Fluarix, Fluarix Quadrivalent, Flublok, Flublok Quadrivalent, FLUCELVAX, Flulaval, Fluvirin, Fluzone, Fluzone High-Dose, Fluzone Intradermal What should I tell my health care provider before I  take this medicine? They need to know if you have any of these conditions: -bleeding disorder like hemophilia -fever or infection -Guillain-Barre syndrome or other neurological problems -immune system problems -infection with the human immunodeficiency virus (HIV) or AIDS -low blood platelet counts -multiple sclerosis -an unusual or allergic reaction to influenza virus vaccine, latex, other medicines, foods, dyes, or preservatives. Different brands of vaccines contain different allergens. Some may contain latex or eggs. Talk to your doctor about your allergies to make sure that you get the right vaccine. -pregnant or trying to get pregnant -breast-feeding How should I use this medicine? This vaccine is for injection into a muscle or under the skin. It is given by a health care professional. A copy of Vaccine Information Statements will be given before each vaccination. Read this sheet carefully each time. The sheet may change frequently. Talk to your healthcare provider to see which vaccines are right for you. Some vaccines should not be used in all age groups. Overdosage: If you think you have taken too much of this medicine contact a poison control center or emergency room at once. NOTE: This medicine is only for you. Do not share this medicine with others. What if I miss a dose? This does not apply. What may interact with this medicine? -chemotherapy or radiation therapy -medicines that lower your immune system like etanercept, anakinra, infliximab, and adalimumab -medicines that treat or prevent blood clots like warfarin -phenytoin -steroid medicines like prednisone or cortisone -theophylline -vaccines This list may not describe all  possible interactions. Give your health care provider a list of all the medicines, herbs, non-prescription drugs, or dietary supplements you use. Also tell them if you smoke, drink alcohol, or use illegal drugs. Some items may interact with your  medicine. What should I watch for while using this medicine? Report any side effects that do not go away within 3 days to your doctor or health care professional. Call your health care provider if any unusual symptoms occur within 6 weeks of receiving this vaccine. You may still catch the flu, but the illness is not usually as bad. You cannot get the flu from the vaccine. The vaccine will not protect against colds or other illnesses that may cause fever. The vaccine is needed every year. What side effects may I notice from receiving this medicine? Side effects that you should report to your doctor or health care professional as soon as possible: -allergic reactions like skin rash, itching or hives, swelling of the face, lips, or tongue Side effects that usually do not require medical attention (report to your doctor or health care professional if they continue or are bothersome): -fever -headache -muscle aches and pains -pain, tenderness, redness, or swelling at the injection site -tiredness This list may not describe all possible side effects. Call your doctor for medical advice about side effects. You may report side effects to FDA at 1-800-FDA-1088. Where should I keep my medicine? The vaccine will be given by a health care professional in a clinic, pharmacy, doctor's office, or other health care setting. You will not be given vaccine doses to store at home. NOTE: This sheet is a summary. It may not cover all possible information. If you have questions about this medicine, talk to your doctor, pharmacist, or health care provider.  2018 Elsevier/Gold Standard (2015-05-09 10:07:28) It is important to avoid accidents which may result in broken bones.  Here are a few ideas on how to make your home safer so you will be less likely to trip or fall.  1. Use nonskid mats or non slip strips in your shower or tub, on your bathroom floor and around sinks.  If you know that you have spilled water, wipe  it up! 2. In the bathroom, it is important to have properly installed grab bars on the walls or on the edge of the tub.  Towel racks are NOT strong enough for you to hold onto or to pull on for support. 3. Stairs and hallways should have enough light.  Add lamps or night lights if you need ore light. 4. It is good to have handrails on both sides of the stairs if possible.  Always fix broken handrails right away. 5. It is important to see the edges of steps.  Paint the edges of outdoor steps white so you can see them better.  Put colored tape on the edge of inside steps. 6. Throw-rugs are dangerous because they can slide.  Removing the rugs is the best idea, but if they must stay, add adhesive carpet tape to prevent slipping. 7. Do not keep things on stairs or in the halls.  Remove small furniture that blocks the halls as it may cause you to trip.  Keep telephone and electrical cords out of the way where you walk. 8. Always were sturdy, rubber-soled shoes for good support.  Never wear just socks, especially on the stairs.  Socks may cause you to slip or fall.  Do not wear full-length housecoats as you can easily trip on  the bottom.  9. Place the things you use the most on the shelves that are the easiest to reach.  If you use a stepstool, make sure it is in good condition.  If you feel unsteady, DO NOT climb, ask for help. 10. If a health professional advises you to use a cane or walker, do not be ashamed.  These items can keep you from falling and breaking your bones.  Health Maintenance, Female Adopting a healthy lifestyle and getting preventive care can go a long way to promote health and wellness. Talk with your health care provider about what schedule of regular examinations is right for you. This is a good chance for you to check in with your provider about disease prevention and staying healthy. In between checkups, there are plenty of things you can do on your own. Experts have done a lot of  research about which lifestyle changes and preventive measures are most likely to keep you healthy. Ask your health care provider for more information. Weight and diet Eat a healthy diet  Be sure to include plenty of vegetables, fruits, low-fat dairy products, and lean protein.  Do not eat a lot of foods high in solid fats, added sugars, or salt.  Get regular exercise. This is one of the most important things you can do for your health. ? Most adults should exercise for at least 150 minutes each week. The exercise should increase your heart rate and make you sweat (moderate-intensity exercise). ? Most adults should also do strengthening exercises at least twice a week. This is in addition to the moderate-intensity exercise.  Maintain a healthy weight  Body mass index (BMI) is a measurement that can be used to identify possible weight problems. It estimates body fat based on height and weight. Your health care provider can help determine your BMI and help you achieve or maintain a healthy weight.  For females 39 years of age and older: ? A BMI below 18.5 is considered underweight. ? A BMI of 18.5 to 24.9 is normal. ? A BMI of 25 to 29.9 is considered overweight. ? A BMI of 30 and above is considered obese.  Watch levels of cholesterol and blood lipids  You should start having your blood tested for lipids and cholesterol at 75 years of age, then have this test every 5 years.  You may need to have your cholesterol levels checked more often if: ? Your lipid or cholesterol levels are high. ? You are older than 75 years of age. ? You are at high risk for heart disease.  Cancer screening Lung Cancer  Lung cancer screening is recommended for adults 69-21 years old who are at high risk for lung cancer because of a history of smoking.  A yearly low-dose CT scan of the lungs is recommended for people who: ? Currently smoke. ? Have quit within the past 15 years. ? Have at least a  30-pack-year history of smoking. A pack year is smoking an average of one pack of cigarettes a day for 1 year.  Yearly screening should continue until it has been 15 years since you quit.  Yearly screening should stop if you develop a health problem that would prevent you from having lung cancer treatment.  Breast Cancer  Practice breast self-awareness. This means understanding how your breasts normally appear and feel.  It also means doing regular breast self-exams. Let your health care provider know about any changes, no matter how small.  If you are  in your 87s or 30s, you should have a clinical breast exam (CBE) by a health care provider every 1-3 years as part of a regular health exam.  If you are 74 or older, have a CBE every year. Also consider having a breast X-ray (mammogram) every year.  If you have a family history of breast cancer, talk to your health care provider about genetic screening.  If you are at high risk for breast cancer, talk to your health care provider about having an MRI and a mammogram every year.  Breast cancer gene (BRCA) assessment is recommended for women who have family members with BRCA-related cancers. BRCA-related cancers include: ? Breast. ? Ovarian. ? Tubal. ? Peritoneal cancers.  Results of the assessment will determine the need for genetic counseling and BRCA1 and BRCA2 testing.  Cervical Cancer Your health care provider may recommend that you be screened regularly for cancer of the pelvic organs (ovaries, uterus, and vagina). This screening involves a pelvic examination, including checking for microscopic changes to the surface of your cervix (Pap test). You may be encouraged to have this screening done every 3 years, beginning at age 86.  For women ages 66-65, health care providers may recommend pelvic exams and Pap testing every 3 years, or they may recommend the Pap and pelvic exam, combined with testing for human papilloma virus (HPV), every  5 years. Some types of HPV increase your risk of cervical cancer. Testing for HPV may also be done on women of any age with unclear Pap test results.  Other health care providers may not recommend any screening for nonpregnant women who are considered low risk for pelvic cancer and who do not have symptoms. Ask your health care provider if a screening pelvic exam is right for you.  If you have had past treatment for cervical cancer or a condition that could lead to cancer, you need Pap tests and screening for cancer for at least 20 years after your treatment. If Pap tests have been discontinued, your risk factors (such as having a new sexual partner) need to be reassessed to determine if screening should resume. Some women have medical problems that increase the chance of getting cervical cancer. In these cases, your health care provider may recommend more frequent screening and Pap tests.  Colorectal Cancer  This type of cancer can be detected and often prevented.  Routine colorectal cancer screening usually begins at 75 years of age and continues through 75 years of age.  Your health care provider may recommend screening at an earlier age if you have risk factors for colon cancer.  Your health care provider may also recommend using home test kits to check for hidden blood in the stool.  A small camera at the end of a tube can be used to examine your colon directly (sigmoidoscopy or colonoscopy). This is done to check for the earliest forms of colorectal cancer.  Routine screening usually begins at age 79.  Direct examination of the colon should be repeated every 5-10 years through 75 years of age. However, you may need to be screened more often if early forms of precancerous polyps or small growths are found.  Skin Cancer  Check your skin from head to toe regularly.  Tell your health care provider about any new moles or changes in moles, especially if there is a change in a mole's shape  or color.  Also tell your health care provider if you have a mole that is larger than the  size of a pencil eraser.  Always use sunscreen. Apply sunscreen liberally and repeatedly throughout the day.  Protect yourself by wearing long sleeves, pants, a wide-brimmed hat, and sunglasses whenever you are outside.  Heart disease, diabetes, and high blood pressure  High blood pressure causes heart disease and increases the risk of stroke. High blood pressure is more likely to develop in: ? People who have blood pressure in the high end of the normal range (130-139/85-89 mm Hg). ? People who are overweight or obese. ? People who are African American.  If you are 35-56 years of age, have your blood pressure checked every 3-5 years. If you are 16 years of age or older, have your blood pressure checked every year. You should have your blood pressure measured twice-once when you are at a hospital or clinic, and once when you are not at a hospital or clinic. Record the average of the two measurements. To check your blood pressure when you are not at a hospital or clinic, you can use: ? An automated blood pressure machine at a pharmacy. ? A home blood pressure monitor.  If you are between 6 years and 35 years old, ask your health care provider if you should take aspirin to prevent strokes.  Have regular diabetes screenings. This involves taking a blood sample to check your fasting blood sugar level. ? If you are at a normal weight and have a low risk for diabetes, have this test once every three years after 75 years of age. ? If you are overweight and have a high risk for diabetes, consider being tested at a younger age or more often. Preventing infection Hepatitis B  If you have a higher risk for hepatitis B, you should be screened for this virus. You are considered at high risk for hepatitis B if: ? You were born in a country where hepatitis B is common. Ask your health care provider which countries  are considered high risk. ? Your parents were born in a high-risk country, and you have not been immunized against hepatitis B (hepatitis B vaccine). ? You have HIV or AIDS. ? You use needles to inject street drugs. ? You live with someone who has hepatitis B. ? You have had sex with someone who has hepatitis B. ? You get hemodialysis treatment. ? You take certain medicines for conditions, including cancer, organ transplantation, and autoimmune conditions.  Hepatitis C  Blood testing is recommended for: ? Everyone born from [birth year] through 1963. ? Anyone with known risk factors for hepatitis C.  Sexually transmitted infections (STIs)  You should be screened for sexually transmitted infections (STIs) including gonorrhea and chlamydia if: ? You are sexually active and are younger than 75 years of age. ? You are older than 75 years of age and your health care provider tells you that you are at risk for this type of infection. ? Your sexual activity has changed since you were last screened and you are at an increased risk for chlamydia or gonorrhea. Ask your health care provider if you are at risk.  If you do not have HIV, but are at risk, it may be recommended that you take a prescription medicine daily to prevent HIV infection. This is called pre-exposure prophylaxis (PrEP). You are considered at risk if: ? You are sexually active and do not regularly use condoms or know the HIV status of your partner(s). ? You take drugs by injection. ? You are sexually active with a partner who  has HIV.  Talk with your health care provider about whether you are at high risk of being infected with HIV. If you choose to begin PrEP, you should first be tested for HIV. You should then be tested every 3 months for as long as you are taking PrEP. Pregnancy  If you are premenopausal and you may become pregnant, ask your health care provider about preconception counseling.  If you may become pregnant, take 400  to 800 micrograms (mcg) of folic acid every day.  If you want to prevent pregnancy, talk to your health care provider about birth control (contraception). Osteoporosis and menopause  Osteoporosis is a disease in which the bones lose minerals and strength with aging. This can result in serious bone fractures. Your risk for osteoporosis can be identified using a bone density scan.  If you are 60 years of age or older, or if you are at risk for osteoporosis and fractures, ask your health care provider if you should be screened.  Ask your health care provider whether you should take a calcium or vitamin D supplement to lower your risk for osteoporosis.  Menopause may have certain physical symptoms and risks.  Hormone replacement therapy may reduce some of these symptoms and risks. Talk to your health care provider about whether hormone replacement therapy is right for you. Follow these instructions at home:  Schedule regular health, dental, and eye exams.  Stay current with your immunizations.  Do not use any tobacco products including cigarettes, chewing tobacco, or electronic cigarettes.  If you are pregnant, do not drink alcohol.  If you are breastfeeding, limit how much and how often you drink alcohol.  Limit alcohol intake to no more than 1 drink per day for nonpregnant women. One drink equals 12 ounces of beer, 5 ounces of Jameela Michna, or 1 ounces of hard liquor.  Do not use street drugs.  Do not share needles.  Ask your health care provider for help if you need support or information about quitting drugs.  Tell your health care provider if you often feel depressed.  Tell your health care provider if you have ever been abused or do not feel safe at home. This information is not intended to replace advice given to you by your health care provider. Make sure you discuss any questions you have with your health care provider. Document Released: 05/03/2011 Document Revised: 03/25/2016  Document Reviewed: 07/22/2015 Elsevier Interactive Patient Education  [employer].
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
## 2018-07-20 NOTE — Progress Notes (Signed)
Patient Care Team: [name], [name], MD as PCP - General (Internal Medicine) Curt Bears, MD (Hematology and Oncology) Paralee Cancel, MD (Orthopedic Surgery) Tyler Pita, MD (Radiation Oncology) Barbaraann Faster, RN as [facility]  DIAGNOSIS:  Encounter Diagnosis  Name Primary?  . Breast cancer metastasized to bone, right ([facility])     SUMMARY OF ONCOLOGIC HISTORY:   Breast cancer metastasized to bone, right ([place])   05/22/2001 Initial Diagnosis    Rt breast: stage IIb (T2, N1, M0) invasive right breast carcinoma with positive estrogen and progesterone receptor as well as HER-2/neu    06/12/2001 Surgery    right lumpectomy with right axillary lymph node dissection revealing 4/9 lymph nodes that were positive for malignancy.     08/12/2001 - 01/10/2002 Chemotherapy    Adj chemo CMF X 8     05/07/2002 - 12/14/2007 Anti-estrogen oral therapy    Adj Tamoxifen for 2 years and then switched to Letrozole (by Dr.Khan in Aug 2005), stopped Feb 2009    02/11/2003 - 04/05/2003 Radiation Therapy    Adj XRT by Tammi Klippel    03/18/2011 -  Anti-estrogen oral therapy    Anastrozole 1 mg daily    04/01/2011 Surgery    Left total hip replacement secondary to pathologic left femoral neck fracture    04/25/2017 Relapse/Recurrence    Anastrozole with Ibrance discontinued 05/30/2018 due to disease progression, Xgeva for bone metastases every 3 months    06/19/2018 Procedure    Liver biopsy: Metastatic breast cancer, ER 100%, PR 0%, Ki-67 50%, HER-2 negative    07/06/2018 Miscellaneous    Foundation 1 results: Microsatellite stable, tumor mutational burden 15 mutations per MB, ATRX, CCND1, CDK4 amplification, ERBB4 amplification, ESR1 amplification, FGFR 1, MDM2 amplification, MEM1, NST3 amplification, PIK3CA mutation, ZNF703 amplification; PDL 1: 0%    07/06/2018 -  Anti-estrogen oral therapy    Ibrance with Faslodex     CHIEF COMPLIANT: Follow-up on Ibrance with  Faslodex  INTERVAL HISTORY: [name] [name] is a 77 year old with above-mentioned history metastatic breast cancer currently on Ibrance with Faslodex.  She is tolerating Faslodex reasonably well.  Does not have any fevers or chills.  Denies any nausea vomiting.  REVIEW OF SYSTEMS:   Constitutional: Denies fevers, chills or abnormal weight loss Eyes: Denies blurriness of vision Ears, nose, mouth, throat, and face: Denies mucositis or sore throat Respiratory: Denies cough, dyspnea or wheezes Cardiovascular: Denies palpitation, chest discomfort Gastrointestinal:  Denies nausea, heartburn or change in bowel habits Skin: Denies abnormal skin rashes Lymphatics: Denies new lymphadenopathy or easy bruising Neurological:Denies numbness, tingling or new weaknesses Behavioral/Psych: Mood is stable, no new changes  Extremities: No lower extremity edema   All other systems were reviewed with the patient and are negative.  I have reviewed the past medical history, past surgical history, social history and family history with the patient and they are unchanged from previous note.  ALLERGIES:  has No Known Allergies.  MEDICATIONS:  Current Outpatient Medications  Medication Sig Dispense Refill  . ALPRAZolam (XANAX) 0.5 MG tablet Take 1 tablet (0.5 mg total) by mouth 2 (two) times daily as needed for anxiety or sleep. 60 tablet 3  . amLODipine (NORVASC) 10 MG tablet Take 0.5 tablets (5 mg total) by mouth daily. 90 tablet 1  . anastrozole (ARIMIDEX) 1 MG tablet TAKE 1 TABLET BY MOUTH ONCE DAILY 90 tablet 2  . Cholecalciferol (VITAMIN D3) 1000 UNITS CAPS Take 1 capsule by mouth daily.     Marland Kitchen  losartan-hydrochlorothiazide (HYZAAR) 100-25 MG tablet Take 1 tablet by mouth daily. 90 tablet 3  . meclizine (ANTIVERT) 12.5 MG tablet Take 1 tablet (12.5 mg total) by mouth 2 (two) times daily as needed for dizziness. 10 tablet 0  . nystatin (MYCOSTATIN/NYSTOP) powder Apply 1 Dose topically daily.  3  .  palbociclib (IBRANCE) 100 MG capsule Take 1 capsule (100 mg total) by mouth daily with breakfast. Take whole with food. 21 capsule 2  . tiZANidine (ZANAFLEX) 4 MG tablet Take 4 mg by mouth every 6 (six) hours as needed for muscle spasms.     No current facility-administered medications for this visit.    Facility-Administered Medications Ordered in Other Visits  Medication Dose Route Frequency Provider Last Rate Last Dose  . alteplase (CATHFLO ACTIVASE) injection 2 mg  2 mg Intracatheter Once PRN Curt Bears, MD      . denosumab (XGEVA) injection 120 mg  120 mg Subcutaneous Once Curt Bears, MD      . fulvestrant (FASLODEX) injection 500 mg  500 mg Intramuscular Once Nicholas Lose, MD        PHYSICAL EXAMINATION: ECOG PERFORMANCE STATUS: 1 - Symptomatic but completely ambulatory  Vitals:   07/20/18 1348  BP: (!) 148/68  Pulse: 89  Resp: 20  Temp: 97.8 F (36.6 C)  SpO2: 100%   Filed Weights   07/20/18 1348  Weight: 204 lb 9.6 oz (92.8 kg)    GENERAL:alert, no distress and comfortable SKIN: skin color, texture, turgor are normal, no rashes or significant lesions EYES: normal, Conjunctiva are pink and non-injected, sclera clear OROPHARYNX:no exudate, no erythema and lips, buccal mucosa, and tongue normal  NECK: supple, thyroid normal size, non-tender, without nodularity LYMPH:  no palpable lymphadenopathy in the cervical, axillary or inguinal LUNGS: clear to auscultation and percussion with normal breathing effort HEART: regular rate & rhythm and no murmurs and no lower extremity edema ABDOMEN:abdomen soft, non-tender and normal bowel sounds MUSCULOSKELETAL:no cyanosis of digits and no clubbing  NEURO: alert & oriented x 3 with fluent speech, no focal motor/sensory deficits EXTREMITIES: No lower extremity edema   LABORATORY DATA:  I have reviewed the data as listed CMP Latest Ref Rng & Units 07/20/2018 07/06/2018 05/30/2018  Glucose 70 - 99 mg/dL 106(H) 102(H) 99    BUN 8 - 23 mg/dL 29(H) 28(H) 13  Creatinine 0.44 - 1.00 mg/dL 2.33(H) 2.70(H) 1.23(H)  Sodium 135 - 145 mmol/L 140 138 141  Potassium 3.5 - 5.1 mmol/L 4.4 4.8 4.2  Chloride 98 - 111 mmol/L 106 105 106  CO2 22 - 32 mmol/L 26 23 26   Calcium 8.9 - 10.3 mg/dL 9.4 9.3 9.5  Total Protein 6.5 - 8.1 g/dL 8.0 7.9 7.8  Total Bilirubin 0.3 - 1.2 mg/dL 0.5 0.5 0.5  Alkaline Phos 38 - 126 U/L 82 83 79  AST 15 - 41 U/L 14(L) 11(L) 19  ALT 0 - 44 U/L <6 <6 7    Lab Results  Component Value Date   WBC 1.8 (L) 07/20/2018   HGB 10.1 (L) 07/20/2018   HCT 30.4 (L) 07/20/2018   MCV 101.2 (H) 07/20/2018   PLT 102 (L) 07/20/2018   NEUTROABS 1.1 (L) 07/20/2018    ASSESSMENT & PLAN:  Breast cancer metastasized to bone, right ([facility]) Metastatic breast cancer with bone metastases along with lung and liver metastases, progressed on Ibrance with anastrozole.  Radiology review: 05/23/2018 CT CAP:New liver metastases 3.1 cm,increase in lung nodules, increase in sclerotic lesion of T11 and T9and S1  vertebral body. Liver biopsy 06/19/2018: Metastatic breast cancer ER 100%, PR 0%, Ki-67 50%, HER-2 negative BRCA1 mutation: Negative  PDL 1: 0% and foundation 1: PI 3 kinase mutation was identified  Current treatment: Faslodex with Ibrance started 06/29/2018,  Patient's insurance rejected Xgeva.  So we will plan on giving Zometa next month.  She will receive Zometa every 3 months.  Plans to do see her back in 2 weeks with labs and follow-up We plan to do scans in 2 months.  No orders of the defined types were placed in this encounter.  The patient has a good understanding of the overall plan. she agrees with it. she will call with any problems that may develop before the next visit here.   Harriette Ohara, MD 07/20/18

## 2018-07-24 LAB — HM DIABETES EYE EXAM

## 2018-07-26 ENCOUNTER — Other Ambulatory Visit: Payer: Self-pay | Admitting: *Deleted

## 2018-07-26 NOTE — Patient Outreach (Signed)
[facility]) Care Management  07/26/2018  [name] [name] [birth year] [id number]   Referral received from telephonic care manager requesting home evaluation due to fall and assessment of needs.  Per chart, she has history of hypertension, GERD, Breast cancer with mets to the bone, and hyperlipidemia.  Call placed to member, no answer, HIPAA compliant voice message left.  Unsuccessful outreach letter sent to member.  Will await call back, will follow up within the next 4 business days.  Valente David, [place], MSN [place] [phone]

## 2018-07-27 ENCOUNTER — Inpatient Hospital Stay: Payer: Medicare HMO | Admitting: Hematology and Oncology

## 2018-07-27 ENCOUNTER — Inpatient Hospital Stay: Payer: Medicare HMO

## 2018-07-27 MED FILL — IBRANCE 100 MG CAPSULE: 100 | 28 days supply | Qty: 21 | Fill #2

## 2018-07-31 ENCOUNTER — Other Ambulatory Visit: Payer: Self-pay | Admitting: *Deleted

## 2018-07-31 NOTE — Patient Outreach (Signed)
[facility]) Care Management  07/31/2018  [name] [name] June 06, 1941 [id number]   2nd attempt made to contact member to introduce self and community services.  She report this is not a good time as [place] airport [place]. Request this care manager call back later this afternoon.    Update:  Call placed back to member, this care manager introduced self and purpose of call.  [facility] care management services explained.  She agrees to involvement, denies any urgent concerns.  Agrees to home visit next week, advised to contact this care manager with questions.  Provided with this care manager's contact information.  Valente David, [place], MSN [place] [phone]

## 2018-08-02 ENCOUNTER — Other Ambulatory Visit: Payer: Self-pay

## 2018-08-02 DIAGNOSIS — C50911 Malignant neoplasm of unspecified site of right female breast: Secondary | ICD-10-CM

## 2018-08-02 DIAGNOSIS — C7951 Secondary malignant neoplasm of bone: Principal | ICD-10-CM

## 2018-08-03 ENCOUNTER — Inpatient Hospital Stay: Payer: Medicare HMO

## 2018-08-03 ENCOUNTER — Telehealth: Payer: Self-pay | Admitting: Hematology and Oncology

## 2018-08-03 ENCOUNTER — Inpatient Hospital Stay (HOSPITAL_BASED_OUTPATIENT_CLINIC_OR_DEPARTMENT_OTHER): Payer: Medicare HMO | Admitting: Hematology and Oncology

## 2018-08-03 ENCOUNTER — Ambulatory Visit: Payer: Medicare HMO

## 2018-08-03 ENCOUNTER — Inpatient Hospital Stay: Payer: Medicare HMO | Attending: Oncology

## 2018-08-03 DIAGNOSIS — C7951 Secondary malignant neoplasm of bone: Secondary | ICD-10-CM | POA: Insufficient documentation

## 2018-08-03 DIAGNOSIS — R200 Anesthesia of skin: Secondary | ICD-10-CM

## 2018-08-03 DIAGNOSIS — C7800 Secondary malignant neoplasm of unspecified lung: Secondary | ICD-10-CM | POA: Diagnosis not present

## 2018-08-03 DIAGNOSIS — Z9221 Personal history of antineoplastic chemotherapy: Secondary | ICD-10-CM | POA: Insufficient documentation

## 2018-08-03 DIAGNOSIS — C787 Secondary malignant neoplasm of liver and intrahepatic bile duct: Secondary | ICD-10-CM | POA: Diagnosis not present

## 2018-08-03 DIAGNOSIS — Z170 Estrogen receptor positive status [ER+]: Secondary | ICD-10-CM | POA: Insufficient documentation

## 2018-08-03 DIAGNOSIS — Z923 Personal history of irradiation: Secondary | ICD-10-CM | POA: Diagnosis not present

## 2018-08-03 DIAGNOSIS — Z5111 Encounter for antineoplastic chemotherapy: Secondary | ICD-10-CM | POA: Diagnosis not present

## 2018-08-03 DIAGNOSIS — C50911 Malignant neoplasm of unspecified site of right female breast: Secondary | ICD-10-CM | POA: Insufficient documentation

## 2018-08-03 DIAGNOSIS — Z79811 Long term (current) use of aromatase inhibitors: Secondary | ICD-10-CM | POA: Diagnosis not present

## 2018-08-03 DIAGNOSIS — R531 Weakness: Secondary | ICD-10-CM | POA: Insufficient documentation

## 2018-08-03 DIAGNOSIS — Z95828 Presence of other vascular implants and grafts: Secondary | ICD-10-CM

## 2018-08-03 LAB — CMP (CANCER CENTER ONLY)
ALK PHOS: 76 U/L (ref 38–126)
ALT: 9 U/L (ref 0–44)
AST: 18 U/L (ref 15–41)
Albumin: 3.5 g/dL (ref 3.5–5.0)
Anion gap: 11 (ref 5–15)
BILIRUBIN TOTAL: 0.3 mg/dL (ref 0.3–1.2)
BUN: 24 mg/dL — AB (ref 8–23)
CALCIUM: 9.7 mg/dL (ref 8.9–10.3)
CO2: 24 mmol/L (ref 22–32)
CREATININE: 2.35 mg/dL — AB (ref 0.44–1.00)
Chloride: 105 mmol/L (ref 98–111)
GFR, EST NON AFRICAN AMERICAN: 19 mL/min — AB (ref >60)
GFR, Est AFR Am: 22 mL/min — ABNORMAL LOW (ref >60)
Glucose, Bld: 84 mg/dL (ref 70–99)
Potassium: 4.3 mmol/L (ref 3.5–5.1)
Sodium: 140 mmol/L (ref 135–145)
Total Protein: 7.9 g/dL (ref 6.5–8.1)

## 2018-08-03 LAB — CBC WITH DIFFERENTIAL (CANCER CENTER ONLY)
BASOS ABS: 0.1 10*3/uL (ref 0.0–0.1)
Basophils Relative: 2 %
Eosinophils Absolute: 0 10*3/uL (ref 0.0–0.5)
Eosinophils Relative: 1 %
HCT: 30.6 % — ABNORMAL LOW (ref 34.8–46.6)
Hemoglobin: 10.2 g/dL — ABNORMAL LOW (ref 11.6–15.9)
LYMPHS PCT: 25 %
Lymphs Abs: 0.7 10*3/uL — ABNORMAL LOW (ref 0.9–3.3)
MCH: 33.7 pg (ref 25.1–34.0)
MCHC: 33.4 g/dL (ref 31.5–36.0)
MCV: 100.9 fL (ref 79.5–101.0)
MONO ABS: 0.7 10*3/uL (ref 0.1–0.9)
MONOS PCT: 26 %
NEUTROS ABS: 1.2 10*3/uL — AB (ref 1.5–6.5)
Neutrophils Relative %: 46 %
Platelet Count: 216 10*3/uL (ref 145–400)
RBC: 3.03 MIL/uL — AB (ref 3.70–5.45)
RDW: 19 % — ABNORMAL HIGH (ref 11.2–14.5)
WBC Count: 2.6 10*3/uL — ABNORMAL LOW (ref 3.9–10.3)

## 2018-08-03 MED ORDER — HEPARIN SOD (PORK) LOCK FLUSH 100 UNIT/ML IV SOLN
500.0000 [IU] | Freq: Once | INTRAVENOUS | Status: DC | PRN
  Filled 2018-08-03: qty 5

## 2018-08-03 MED ORDER — SODIUM CHLORIDE 0.9% FLUSH
10.0000 mL | Freq: Once | INTRAVENOUS | Status: DC | PRN
  Filled 2018-08-03: qty 10

## 2018-08-03 MED ORDER — HEPARIN SOD (PORK) LOCK FLUSH 100 UNIT/ML IV SOLN
500.0000 [IU] | Freq: Once | INTRAVENOUS | Status: AC | PRN
  Administered 2018-08-03: 500 [IU] via INTRAVENOUS
  Filled 2018-08-03: qty 5

## 2018-08-03 MED ORDER — ALTEPLASE 2 MG IJ SOLR
2.0000 mg | Freq: Once | INTRAMUSCULAR | Status: DC | PRN
  Filled 2018-08-03: qty 2

## 2018-08-03 MED ORDER — FULVESTRANT 250 MG/5ML IM SOLN
500.0000 mg | Freq: Once | INTRAMUSCULAR | Status: AC
  Administered 2018-08-03: 500 mg via INTRAMUSCULAR

## 2018-08-03 MED ORDER — SODIUM CHLORIDE 0.9 % IV SOLN
Freq: Once | INTRAVENOUS | Status: AC
  Administered 2018-08-03: 10:00:00 via INTRAVENOUS
  Filled 2018-08-03: qty 250

## 2018-08-03 MED ORDER — AMLODIPINE BESYLATE 10 MG PO TABS: 10.0000 mg | ORAL_TABLET | Freq: Every day | ORAL | 0 refills | Status: DC

## 2018-08-03 MED ORDER — ZOLEDRONIC ACID 4 MG/5ML IV CONC
3.0000 mg | Freq: Once | INTRAVENOUS | Status: AC
  Administered 2018-08-03: 3 mg via INTRAVENOUS
  Filled 2018-08-03: qty 3.75

## 2018-08-03 MED ORDER — FULVESTRANT 250 MG/5ML IM SOLN
INTRAMUSCULAR | Status: AC
  Filled 2018-08-03: qty 10

## 2018-08-03 MED ORDER — SODIUM CHLORIDE 0.9 % IJ SOLN
10.0000 mL | INTRAMUSCULAR | Status: DC | PRN
  Administered 2018-08-03: 10 mL via INTRAVENOUS
  Filled 2018-08-03: qty 10

## 2018-08-03 MED FILL — AMLODIPINE BESYLATE 10 MG T: 10 | 30 days supply | Qty: 30 | Fill #0

## 2018-08-03 NOTE — Patient Instructions (Signed)
Zoledronic Acid injection (Hypercalcemia, Oncology) What is this medicine? ZOLEDRONIC ACID (ZOE le dron ik AS id) lowers the amount of calcium loss from bone. It is used to treat too much calcium in your blood from cancer. It is also used to prevent complications of cancer that has spread to the bone. This medicine may be used for other purposes; ask your health care provider or pharmacist if you have questions. COMMON BRAND NAME(S): Zometa What should I tell my health care provider before I take this medicine? They need to know if you have any of these conditions: -aspirin-sensitive asthma -cancer, especially if you are receiving medicines used to treat cancer -dental disease or wear dentures -infection -kidney disease -receiving corticosteroids like dexamethasone or prednisone -an unusual or allergic reaction to zoledronic acid, other medicines, foods, dyes, or preservatives -pregnant or trying to get pregnant -breast-feeding How should I use this medicine? This medicine is for infusion into a vein. It is given by a health care professional in a hospital or clinic setting. Talk to your pediatrician regarding the use of this medicine in children. Special care may be needed. Overdosage: If you think you have taken too much of this medicine contact a poison control center or emergency room at once. NOTE: This medicine is only for you. Do not share this medicine with others. What if I miss a dose? It is important not to miss your dose. Call your doctor or health care professional if you are unable to keep an appointment. What may interact with this medicine? -certain antibiotics given by injection -NSAIDs, medicines for pain and inflammation, like ibuprofen or naproxen -some diuretics like bumetanide, furosemide -teriparatide -thalidomide This list may not describe all possible interactions. Give your health care provider a list of all the medicines, herbs, non-prescription drugs, or  dietary supplements you use. Also tell them if you smoke, drink alcohol, or use illegal drugs. Some items may interact with your medicine. What should I watch for while using this medicine? Visit your doctor or health care professional for regular checkups. It may be some time before you see the benefit from this medicine. Do not stop taking your medicine unless your doctor tells you to. Your doctor may order blood tests or other tests to see how you are doing. Women should inform their doctor if they wish to become pregnant or think they might be pregnant. There is a potential for serious side effects to an unborn child. Talk to your health care professional or pharmacist for more information. You should make sure that you get enough calcium and vitamin D while you are taking this medicine. Discuss the foods you eat and the vitamins you take with your health care professional. Some people who take this medicine have severe bone, joint, and/or muscle pain. This medicine may also increase your risk for jaw problems or a broken thigh bone. Tell your doctor right away if you have severe pain in your jaw, bones, joints, or muscles. Tell your doctor if you have any pain that does not go away or that gets worse. Tell your dentist and dental surgeon that you are taking this medicine. You should not have major dental surgery while on this medicine. See your dentist to have a dental exam and fix any dental problems before starting this medicine. Take good care of your teeth while on this medicine. Make sure you see your dentist for regular follow-up appointments. What side effects may I notice from receiving this medicine? Side effects that   you should report to your doctor or health care professional as soon as possible: -allergic reactions like skin rash, itching or hives, swelling of the face, lips, or tongue -anxiety, confusion, or depression -breathing problems -changes in vision -eye pain -feeling faint or  lightheaded, falls -jaw pain, especially after dental work -mouth sores -muscle cramps, stiffness, or weakness -redness, blistering, peeling or loosening of the skin, including inside the mouth -trouble passing urine or change in the amount of urine Side effects that usually do not require medical attention (report to your doctor or health care professional if they continue or are bothersome): -bone, joint, or muscle pain -constipation -diarrhea -fever -hair loss -irritation at site where injected -loss of appetite -nausea, vomiting -stomach upset -trouble sleeping -trouble swallowing -weak or tired This list may not describe all possible side effects. Call your doctor for medical advice about side effects. You may report side effects to FDA at 1-800-FDA-1088. Where should I keep my medicine? This drug is given in a hospital or clinic and will not be stored at home. NOTE: This sheet is a summary. It may not cover all possible information. If you have questions about this medicine, talk to your doctor, pharmacist, or health care provider.  2018 Elsevier/Gold Standard (2014-03-16 14:19:39)  Fulvestrant injection What is this medicine? FULVESTRANT (ful VES trant) blocks the effects of estrogen. It is used to treat breast cancer. This medicine may be used for other purposes; ask your health care provider or pharmacist if you have questions. COMMON BRAND NAME(S): FASLODEX What should I tell my health care provider before I take this medicine? They need to know if you have any of these conditions: -bleeding problems -liver disease -low levels of platelets in the blood -an unusual or allergic reaction to fulvestrant, other medicines, foods, dyes, or preservatives -pregnant or trying to get pregnant -breast-feeding How should I use this medicine? This medicine is for injection into a muscle. It is usually given by a health care professional in a hospital or clinic setting. Talk to  your pediatrician regarding the use of this medicine in children. Special care may be needed. Overdosage: If you think you have taken too much of this medicine contact a poison control center or emergency room at once. NOTE: This medicine is only for you. Do not share this medicine with others. What if I miss a dose? It is important not to miss your dose. Call your doctor or health care professional if you are unable to keep an appointment. What may interact with this medicine? -medicines that treat or prevent blood clots like warfarin, enoxaparin, and dalteparin This list may not describe all possible interactions. Give your health care provider a list of all the medicines, herbs, non-prescription drugs, or dietary supplements you use. Also tell them if you smoke, drink alcohol, or use illegal drugs. Some items may interact with your medicine. What should I watch for while using this medicine? Your condition will be monitored carefully while you are receiving this medicine. You will need important blood work done while you are taking this medicine. Do not become pregnant while taking this medicine or for at least 1 year after stopping it. Women of child-bearing potential will need to have a negative pregnancy test before starting this medicine. Women should inform their doctor if they wish to become pregnant or think they might be pregnant. There is a potential for serious side effects to an unborn child. Men should inform their doctors if they wish to father   a child. This medicine may lower sperm counts. Talk to your health care professional or pharmacist for more information. Do not breast-feed an infant while taking this medicine or for 1 year after the last dose. What side effects may I notice from receiving this medicine? Side effects that you should report to your doctor or health care professional as soon as possible: -allergic reactions like skin rash, itching or hives, swelling of the face,  lips, or tongue -feeling faint or lightheaded, falls -pain, tingling, numbness, or weakness in the legs -signs and symptoms of infection like fever or chills; cough; flu-like symptoms; sore throat -vaginal bleeding Side effects that usually do not require medical attention (report to your doctor or health care professional if they continue or are bothersome): -aches, pains -constipation -diarrhea -headache -hot flashes -nausea, vomiting -pain at site where injected -stomach pain This list may not describe all possible side effects. Call your doctor for medical advice about side effects. You may report side effects to FDA at 1-800-FDA-1088. Where should I keep my medicine? This drug is given in a hospital or clinic and will not be stored at home. NOTE: This sheet is a summary. It may not cover all possible information. If you have questions about this medicine, talk to your doctor, pharmacist, or health care provider.  2018 Elsevier/Gold Standard (2015-05-16 11:03:55)  

## 2018-08-03 NOTE — Assessment & Plan Note (Signed)
Metastatic breast cancer with bone metastases along with lung and liver metastases, progressed on Ibrance with anastrozole.  Radiology review: 05/23/2018 CT CAP:New liver metastases 3.1 cm,increase in lung nodules, increase in sclerotic lesion of T11 and T9and S1 vertebral body. Liver biopsy 06/19/2018: Metastatic breast cancer ER 100%, PR 0%, Ki-67 50%, HER-2 negative BRCA1 mutation: Negative  PDL 1: 0% and foundation 1: PI 3 kinase mutation was identified  Current treatment: Faslodex with Ibrance started 06/29/2018,  Patient's insurance rejected Xgeva.  So we will plan on giving Zometa next month.  She will receive Zometa every 3 months.  Plans to do see her back in 1 month with labs and follow-up We plan to do scans in 2 months.

## 2018-08-03 NOTE — Progress Notes (Signed)
Patient Care Team: [name], [name], MD as PCP - General (Internal Medicine) Curt Bears, MD (Hematology and Oncology) Paralee Cancel, MD (Orthopedic Surgery) Tyler Pita, MD (Radiation Oncology) Nicholas Lose, MD as Consulting Physician (Hematology and Oncology) Valente David, RN as [place] Management  DIAGNOSIS:  Encounter Diagnosis  Name Primary?  . Breast cancer metastasized to bone, right ([place])     SUMMARY OF ONCOLOGIC HISTORY:   Breast cancer metastasized to bone, right ([place])   05/22/2001 Initial Diagnosis    Rt breast: stage IIb (T2, N1, M0) invasive right breast carcinoma with positive estrogen and progesterone receptor as well as HER-2/neu    06/12/2001 Surgery    right lumpectomy with right axillary lymph node dissection revealing 4/9 lymph nodes that were positive for malignancy.     08/12/2001 - 01/10/2002 Chemotherapy    Adj chemo CMF X 8     05/07/2002 - 12/14/2007 Anti-estrogen oral therapy    Adj Tamoxifen for 2 years and then switched to Letrozole (by Dr.Khan in Aug 2005), stopped Feb 2009    02/11/2003 - 04/05/2003 Radiation Therapy    Adj XRT by Tammi Klippel    03/18/2011 -  Anti-estrogen oral therapy    Anastrozole 1 mg daily    04/01/2011 Surgery    Left total hip replacement secondary to pathologic left femoral neck fracture    04/25/2017 Relapse/Recurrence    Anastrozole with Ibrance discontinued 05/30/2018 due to disease progression, Xgeva for bone metastases every 3 months    06/19/2018 Procedure    Liver biopsy: Metastatic breast cancer, ER 100%, PR 0%, Ki-67 50%, HER-2 negative    07/06/2018 Miscellaneous    Foundation 1 results: Microsatellite stable, tumor mutational burden 15 mutations per MB, ATRX, CCND1, CDK4 amplification, ERBB4 amplification, ESR1 amplification, FGFR 1, MDM2 amplification, MEM1, NST3 amplification, PIK3CA mutation, ZNF703 amplification; PDL 1: 0%    07/06/2018 -  Anti-estrogen oral therapy    Ibrance  with Faslodex     CHIEF COMPLIANT: Follow-up on Ibrance with Faslodex  INTERVAL HISTORY: [name] [name] is a 77 year old with above-mentioned history of metastatic breast cancer currently on Ibrance with Faslodex.  Today she is also receiving Zometa.  Delton See was rejected by her insurance.  She is tolerating Faslodex and Ibrance fairly well.  She is complaining of pain in the right arm.  This is causing her significant discomfort and numbness.  Is  making it difficult for her to write.  REVIEW OF SYSTEMS:   Constitutional: Denies fevers, chills or abnormal weight loss Eyes: Denies blurriness of vision Ears, nose, mouth, throat, and face: Denies mucositis or sore throat Respiratory: Denies cough, dyspnea or wheezes Cardiovascular: Denies palpitation, chest discomfort Gastrointestinal:  Denies nausea, heartburn or change in bowel habits Skin: Denies abnormal skin rashes Lymphatics: Denies new lymphadenopathy or easy bruising Neurological:Denies numbness, tingling or new weaknesses Behavioral/Psych: Mood is stable, no new changes  Extremities: Right arm numbness and weakness   All other systems were reviewed with the patient and are negative.  I have reviewed the past medical history, past surgical history, social history and family history with the patient and they are unchanged from previous note.  ALLERGIES:  has No Known Allergies.  MEDICATIONS:  Current Outpatient Medications  Medication Sig Dispense Refill  . ALPRAZolam (XANAX) 0.5 MG tablet Take 1 tablet (0.5 mg total) by mouth 2 (two) times daily as needed for anxiety or sleep. 60 tablet 3  . amLODipine (NORVASC) 10 MG tablet Take 0.5 tablets (5 mg  total) by mouth daily. 90 tablet 1  . anastrozole (ARIMIDEX) 1 MG tablet TAKE 1 TABLET BY MOUTH ONCE DAILY 90 tablet 2  . Cholecalciferol (VITAMIN D3) 1000 UNITS CAPS Take 1 capsule by mouth daily.     Marland Kitchen losartan-hydrochlorothiazide (HYZAAR) 100-25 MG tablet Take 1 tablet by mouth  daily. 90 tablet 3  . meclizine (ANTIVERT) 12.5 MG tablet Take 1 tablet (12.5 mg total) by mouth 2 (two) times daily as needed for dizziness. 10 tablet 0  . nystatin (MYCOSTATIN/NYSTOP) powder Apply 1 Dose topically daily.  3  . palbociclib (IBRANCE) 100 MG capsule Take 1 capsule (100 mg total) by mouth daily with breakfast. Take whole with food. 21 capsule 2  . tiZANidine (ZANAFLEX) 4 MG tablet Take 4 mg by mouth every 6 (six) hours as needed for muscle spasms.     No current facility-administered medications for this visit.    Facility-Administered Medications Ordered in Other Visits  Medication Dose Route Frequency Provider Last Rate Last Dose  . alteplase (CATHFLO ACTIVASE) injection 2 mg  2 mg Intracatheter Once PRN Curt Bears, MD      . denosumab (XGEVA) injection 120 mg  120 mg Subcutaneous Once Curt Bears, MD        PHYSICAL EXAMINATION: ECOG PERFORMANCE STATUS: 1 - Symptomatic but completely ambulatory  Vitals:   08/03/18 0915  BP: (!) 132/57  Pulse: 73  Resp: 16  Temp: (!) 97.5 F (36.4 C)  SpO2: 100%   Filed Weights   08/03/18 0915  Weight: 207 lb (93.9 kg)    GENERAL:alert, no distress and comfortable SKIN: skin color, texture, turgor are normal, no rashes or significant lesions EYES: normal, Conjunctiva are pink and non-injected, sclera clear OROPHARYNX:no exudate, no erythema and lips, buccal mucosa, and tongue normal  NECK: supple, thyroid normal size, non-tender, without nodularity LYMPH:  no palpable lymphadenopathy in the cervical, axillary or inguinal LUNGS: clear to auscultation and percussion with normal breathing effort HEART: regular rate & rhythm and no murmurs and no lower extremity edema ABDOMEN:abdomen soft, non-tender and normal bowel sounds MUSCULOSKELETAL:no cyanosis of digits and no clubbing  NEURO: alert & oriented x 3 with fluent speech, no focal motor/sensory deficits EXTREMITIES: No lower extremity edema   LABORATORY DATA:  I  have reviewed the data as listed CMP Latest Ref Rng & Units 07/20/2018 07/06/2018 05/30/2018  Glucose 70 - 99 mg/dL 106(H) 102(H) 99  BUN 8 - 23 mg/dL 29(H) 28(H) 13  Creatinine 0.44 - 1.00 mg/dL 2.33(H) 2.70(H) 1.23(H)  Sodium 135 - 145 mmol/L 140 138 141  Potassium 3.5 - 5.1 mmol/L 4.4 4.8 4.2  Chloride 98 - 111 mmol/L 106 105 106  CO2 22 - 32 mmol/L 26 23 26   Calcium 8.9 - 10.3 mg/dL 9.4 9.3 9.5  Total Protein 6.5 - 8.1 g/dL 8.0 7.9 7.8  Total Bilirubin 0.3 - 1.2 mg/dL 0.5 0.5 0.5  Alkaline Phos 38 - 126 U/L 82 83 79  AST 15 - 41 U/L 14(L) 11(L) 19  ALT 0 - 44 U/L <6 <6 7    Lab Results  Component Value Date   WBC 2.6 (L) 08/03/2018   HGB 10.2 (L) 08/03/2018   HCT 30.6 (L) 08/03/2018   MCV 100.9 08/03/2018   PLT 216 08/03/2018   NEUTROABS 1.2 (L) 08/03/2018    ASSESSMENT & PLAN:  Breast cancer metastasized to bone, right ([facility]) Metastatic breast cancer with bone metastases along with lung and liver metastases, progressed on Ibrance with anastrozole.  Radiology  review: 05/23/2018 CT CAP:New liver metastases 3.1 cm,increase in lung nodules, increase in sclerotic lesion of T11 and T9and S1 vertebral body. Liver biopsy 06/19/2018: Metastatic breast cancer ER 100%, PR 0%, Ki-67 50%, HER-2 negative BRCA1 mutation: Negative  PDL 1: 0% and foundation 1: PI 3 kinase mutation was identified  Current treatment: Faslodex with Ibrance started 06/29/2018,  Patient's insurance rejected Xgeva.  So we will plan on giving Zometa next month.  She will receive Zometa every 3 months.  Right arm numbness and weakness: I will obtain a cervical spine MRI for further evaluation.  Plans to do see her back in 1 month with labs and follow-up We plan to do scans in 2 months.    No orders of the defined types were placed in this encounter.  The patient has a good understanding of the overall plan. she agrees with it. she will call with any problems that may develop before the next visit here.    Harriette Ohara, MD 08/03/18

## 2018-08-03 NOTE — Telephone Encounter (Signed)
Per 10/3 los.  Gave patient avs and calendar.

## 2018-08-05 ENCOUNTER — Ambulatory Visit (HOSPITAL_COMMUNITY)
Admission: RE | Admit: 2018-08-05 | Discharge: 2018-08-05 | Disposition: A | Discharge status: Home / Self Care | Payer: Medicare HMO | Source: Ambulatory Visit | Attending: Hematology and Oncology | Admitting: Hematology and Oncology

## 2018-08-05 ENCOUNTER — Other Ambulatory Visit: Payer: Self-pay | Admitting: Hematology and Oncology

## 2018-08-05 DIAGNOSIS — C7951 Secondary malignant neoplasm of bone: Principal | ICD-10-CM

## 2018-08-05 DIAGNOSIS — M47812 Spondylosis without myelopathy or radiculopathy, cervical region: Secondary | ICD-10-CM | POA: Insufficient documentation

## 2018-08-05 DIAGNOSIS — C50911 Malignant neoplasm of unspecified site of right female breast: Secondary | ICD-10-CM

## 2018-08-05 DIAGNOSIS — R9389 Abnormal findings on diagnostic imaging of other specified body structures: Secondary | ICD-10-CM | POA: Insufficient documentation

## 2018-08-05 DIAGNOSIS — M50222 Other cervical disc displacement at C5-C6 level: Secondary | ICD-10-CM | POA: Diagnosis not present

## 2018-08-05 DIAGNOSIS — N289 Disorder of kidney and ureter, unspecified: Secondary | ICD-10-CM | POA: Insufficient documentation

## 2018-08-05 DIAGNOSIS — M50223 Other cervical disc displacement at C6-C7 level: Secondary | ICD-10-CM | POA: Diagnosis not present

## 2018-08-10 ENCOUNTER — Other Ambulatory Visit: Payer: Self-pay

## 2018-08-10 ENCOUNTER — Other Ambulatory Visit: Payer: Self-pay | Admitting: *Deleted

## 2018-08-10 ENCOUNTER — Encounter: Payer: Self-pay | Admitting: *Deleted

## 2018-08-10 NOTE — Patient Outreach (Signed)
[facility]) Care Management  08/10/2018  [name] [name] August 16, 1941 [id number]  Successful outreach to the patient on today's date, HIPAA identifiers confirmed. BSW introduced self to the patient and the reason for today's call, indicating this BSW received a referral to assist the patient with community resources such as advance directives and a medical alert. The patient stated "well not really, I need help finding somewhere else to stay".  The patient reports she is looks for apartments for seniors only or possibly an assisted living facility. The patient reports it is getting more difficult to care for self due to continued problems with her right hand. The patient prefers to live independently in a senior apartment but may consider placement pending the progression of her illness. BSW educated the patient that if she chooses long-term placement to notify [facility] Management of that decision so we may assist with placement. The patient stated understanding.  The patient states the reason she is wanting to move out of her current apartment is because it is "inconvenient and no compassion". The patient states she submitted an application to [place] in March of 2019. The patient indicates this would be her top choice. Other places the patient has named includes [place] and [employer]. The patient reports she does have applications to both these housing options but has yet to fill them out. The patient states she in unsure she will be capable of completing them on her own. The patient states her grand-daughter "may" help her. BSW encouraged the patient to contact her grand-daughter to request assistance. The patient stated understanding.  The patient also reports she is interested in resources to obtain a caregiver to assist with housekeeping and emptying her trash. The patient does not feel she is able to pay for this expense out of pocket. The patient is  over the income limit for Medicaid. BSW spoke with the patient about the in-home aide services offered through [place] [facility]. The patient is agreeable to this BSW placing a referral. The patient understands there is a waiting list.  BSW placed a call to the in-home aide services program. BSW left a voice message for Inda Merlin requesting a return call.   Plan: BSW will outreach the patient next week to discuss application status. BSW will outreach the in-home aide services program within the next two days to place a referral if a return call is not received.  Daneen Schick, BSW, CDP [facility] [phone]
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
## 2018-08-14 ENCOUNTER — Other Ambulatory Visit: Payer: Self-pay

## 2018-08-14 NOTE — Patient Outreach (Signed)
[facility]) Care Management  08/14/2018  [name] [name] 1941-09-29 [id number]  Successful call placed to the [place] in-home aide services program. BSW has completed referral for the patient and the patient has been placed on the waiting list.  Daneen Schick, Arita Miss, [place] Management Social Worker [phone]

## 2018-08-17 ENCOUNTER — Ambulatory Visit: Payer: Medicare HMO

## 2018-08-18 ENCOUNTER — Other Ambulatory Visit: Payer: Self-pay | Admitting: Oncology

## 2018-08-18 ENCOUNTER — Other Ambulatory Visit: Payer: Self-pay

## 2018-08-18 DIAGNOSIS — C7951 Secondary malignant neoplasm of bone: Principal | ICD-10-CM

## 2018-08-18 DIAGNOSIS — C50911 Malignant neoplasm of unspecified site of right female breast: Secondary | ICD-10-CM

## 2018-08-18 NOTE — Patient Outreach (Signed)
[facility]) Care Management  08/18/2018  [name] [name] May 04, 1941 [id number]  Successful outreach to the patient on today's date, HIPAA identifiers confirmed. The patient states her grandson, [place], plans to visit this afternoon to assist with housing applications. The patient stated he plans to arrive "when he gets out of school". The patient seemed somewhat unsure if her grandson would arrive and stated "if he doesn't come I will call Danielle". Andee Poles is the patients grand-daughter whom the patient has stated remains "very busy".  BSW requested the patient contact this BSW directly if she has difficulty completing applications. The patient stated understanding and confirms she has this BSW's number.   The patient is unable to confirm receipt of mailed resources but states she does not check her mailbox daily so it may be there.  Plan: BSW to contact the patient in the next two weeks to confirm receipt of resources. Once BSW is able to confirm resources received and patients applications have been completed BSW will perform a case closure.  Daneen Schick, BSW, CDP [facility] [phone]

## 2018-08-21 ENCOUNTER — Encounter: Payer: Self-pay | Admitting: Internal Medicine

## 2018-08-21 ENCOUNTER — Ambulatory Visit (INDEPENDENT_AMBULATORY_CARE_PROVIDER_SITE_OTHER): Payer: Medicare HMO | Admitting: Internal Medicine

## 2018-08-21 DIAGNOSIS — E1329 Other specified diabetes mellitus with other diabetic kidney complication: Secondary | ICD-10-CM | POA: Diagnosis not present

## 2018-08-21 DIAGNOSIS — E559 Vitamin D deficiency, unspecified: Secondary | ICD-10-CM | POA: Diagnosis not present

## 2018-08-21 DIAGNOSIS — I10 Essential (primary) hypertension: Secondary | ICD-10-CM | POA: Diagnosis not present

## 2018-08-21 DIAGNOSIS — R29898 Other symptoms and signs involving the musculoskeletal system: Secondary | ICD-10-CM | POA: Diagnosis not present

## 2018-08-21 MED ORDER — PREDNISONE 10 MG PO TABS: ORAL_TABLET | ORAL | 1 refills | Status: DC

## 2018-08-21 MED ORDER — AMLODIPINE BESYLATE 10 MG PO TABS: 10.0000 mg | ORAL_TABLET | Freq: Every day | ORAL | 11 refills | Status: DC

## 2018-08-21 MED ORDER — ALPRAZOLAM 0.5 MG PO TABS: 0.5000 mg | ORAL_TABLET | Freq: Two times a day (BID) | ORAL | 3 refills | Status: DC | PRN

## 2018-08-21 MED FILL — ALPRAZolam 0.5 MG TABS: 0.5 | 30 days supply | Qty: 60 | Fill #0

## 2018-08-21 MED FILL — predniSONE 10 MG TABS: 10 | 17 days supply | Qty: 38 | Fill #0

## 2018-08-21 NOTE — Assessment & Plan Note (Signed)
Hyzaar, Amlodipine

## 2018-08-21 NOTE — Assessment & Plan Note (Signed)
Glucose will go up on prednisone - discussed

## 2018-08-21 NOTE — Progress Notes (Signed)
Subjective:  Patient ID: [name] [name], female    DOB: 12-01-1940  Age: 77 y.o. MRN: [id number]  CC: No chief complaint on file.   HPI [name] [name] presents for R hand coldness, weakness and numbness x 4-5 mo - worse. She is R handed... F/u anxiety, HTN, breast ca  Outpatient Medications Prior to Visit  Medication Sig Dispense Refill  . ALPRAZolam (XANAX) 0.5 MG tablet Take 1 tablet (0.5 mg total) by mouth 2 (two) times daily as needed for anxiety or sleep. 60 tablet 3  . amLODipine (NORVASC) 10 MG tablet Take 1 tablet (10 mg total) by mouth daily. 30 tablet 0  . anastrozole (ARIMIDEX) 1 MG tablet TAKE 1 TABLET BY MOUTH ONCE DAILY 90 tablet 2  . Cholecalciferol (VITAMIN D3) 1000 UNITS CAPS Take 1 capsule by mouth daily.     Marland Kitchen losartan-hydrochlorothiazide (HYZAAR) 100-25 MG tablet Take 1 tablet by mouth daily. 90 tablet 3  . meclizine (ANTIVERT) 12.5 MG tablet Take 1 tablet (12.5 mg total) by mouth 2 (two) times daily as needed for dizziness. 10 tablet 0  . nystatin (MYCOSTATIN/NYSTOP) powder Apply 1 Dose topically daily.  3  . palbociclib (IBRANCE) 100 MG capsule Take 1 capsule (100 mg total) by mouth daily with breakfast. Take whole with food. 21 capsule 2  . tiZANidine (ZANAFLEX) 4 MG tablet Take 4 mg by mouth every 6 (six) hours as needed for muscle spasms.     Facility-Administered Medications Prior to Visit  Medication Dose Route Frequency Provider Last Rate Last Dose  . alteplase (CATHFLO ACTIVASE) injection 2 mg  2 mg Intracatheter Once PRN Curt Bears, MD      . denosumab (XGEVA) injection 120 mg  120 mg Subcutaneous Once Curt Bears, MD        ROS: Review of Systems  Constitutional: Positive for fatigue. Negative for activity change, appetite change, chills and unexpected weight change.  HENT: Negative for congestion, mouth sores and sinus pressure.   Eyes: Negative for visual disturbance.  Respiratory: Negative for cough and chest tightness.     Gastrointestinal: Negative for abdominal pain and nausea.  Genitourinary: Negative for difficulty urinating, frequency and vaginal pain.  Musculoskeletal: Positive for arthralgias, back pain and gait problem.  Skin: Negative for pallor and rash.  Neurological: Positive for weakness. Negative for dizziness, tremors, numbness and headaches.  Psychiatric/Behavioral: Negative for confusion and sleep disturbance.    Objective:  BP 126/68 (BP Location: Left Arm, Patient Position: Sitting, Cuff Size: Large)   Pulse 84   Temp 97.9 F (36.6 C) (Oral)   Ht 5\' 3"  (1.6 m)   Wt 207 lb (93.9 kg)   SpO2 97%   BMI 36.67 kg/m   BP Readings from Last 3 Encounters:  08/21/18 126/68  08/10/18 110/62  08/03/18 (!) 132/57    Wt Readings from Last 3 Encounters:  08/21/18 207 lb (93.9 kg)  08/10/18 207 lb (93.9 kg)  08/03/18 207 lb (93.9 kg)    Physical Exam  Constitutional: She appears well-developed. No distress.  HENT:  Head: Normocephalic.  Right Ear: External ear normal.  Left Ear: External ear normal.  Nose: Nose normal.  Mouth/Throat: Oropharynx is clear and moist.  Eyes: Pupils are equal, round, and reactive to light. Conjunctivae are normal. Right eye exhibits no discharge. Left eye exhibits no discharge.  Neck: Normal range of motion. Neck supple. No JVD present. No tracheal deviation present. No thyromegaly present.  Cardiovascular: Normal rate, regular rhythm and normal heart sounds.  Pulmonary/Chest: No stridor. No respiratory distress. She has no wheezes.  Abdominal: Soft. Bowel sounds are normal. She exhibits no distension and no mass. There is no tenderness. There is no rebound and no guarding.  Musculoskeletal: She exhibits tenderness. She exhibits no edema.  Lymphadenopathy:    She has no cervical adenopathy.  Neurological: She displays normal reflexes. No cranial nerve deficit. She exhibits normal muscle tone. Coordination abnormal.  Skin: No rash noted. No erythema.   Psychiatric: She has a normal mood and affect. Her behavior is normal. Judgment and thought content normal.   Cane R hand grip is 5-/5 R hand is cooler to touch Neck tender w/ROM on the R   Mr Cervical Spine Wo Contrast  Result Date: 08/05/2018 CLINICAL DATA:  Right arm numbness. History of stage IV breast cancer. No IV contrast administered due to renal insufficiency (GFR of 22). EXAM: MRI CERVICAL SPINE WITHOUT CONTRAST TECHNIQUE: Multiplanar, multisequence MR imaging of the cervical spine was performed. No intravenous contrast was administered. COMPARISON:  Nuclear medicine whole-body bone scan 05/23/2018. Chest CT 05/23/2018. FINDINGS: Multiple sequences are moderately too severely motion degraded as the patient was in pain and unable to remain motionless during the examination. Alignment: Trace retrolisthesis of C4 on C5 and trace anterolisthesis of C7 on T1, likely degenerative. Vertebrae: No fracture, significant marrow edema, or destructive bone lesion. Sclerosis in the right T2 lamina and inferior articular process, also present on CT. Cord: Limited assessment due to motion artifact. No gross cord signal abnormality or significant cord compression. Posterior Fossa, vertebral arteries, paraspinal tissues: Suspected tiny chronic infarcts in the cerebellum bilaterally. Grossly preserved vertebral artery flow voids. Disc levels: C2-3: Mild disc bulging without stenosis. C3-4: Mild disc bulging and mild facet arthrosis result in borderline to mild spinal stenosis without evidence of significant neural foraminal stenosis. C4-5: Mild disc bulging and uncovertebral spurring result in likely mild right neural foraminal stenosis and borderline to mild spinal stenosis. C5-6: Mild disc bulging and uncovertebral spurring without evidence of significant stenosis. C6-7: Mild disc bulging, small central disc protrusion, and uncovertebral spurring result in borderline spinal stenosis without evidence of  significant neural foraminal stenosis. C7-T1: There is intermediate to low T1 and T2 signal intensity material predominantly located lateral to the right C7-T1 neural foramen which appears to extend into the posterolateral aspect of the foramen. This is poorly evaluated on axial images due to motion and was incompletely imaged on sagittal sequences but appears to measure approximately 1 cm in maximal dimension. No significant regional edema is evident on STIR imaging. Similar soft tissue was present in this location on a 02/06/2018 chest CT, however this finding is new from 2017. There is stenosis of the lateral aspect of the right neural foramen with potential C8 nerve root impingement. There is mild right and moderate left facet arthrosis without significant nerve left neural foraminal stenosis or spinal stenosis. Imaging of the upper thoracic spine in the sagittal plane only demonstrates disc bulging from T1-2 to T4-5 resulting in up to mild spinal stenosis without significant spinal cord mass effect. There is mild left neural foraminal stenosis at T2-3. IMPRESSION: 1. Moderately motion degraded examination. No IV contrast was given due to renal insufficiency. 2. Abnormal soft tissue within and predominantly lateral to the right C7-T1 neural foramen of uncertain etiology. Potential considerations include an unusual, large disc herniation, atypical cyst arising from the mildly degenerated right facet joint, or tumor. A nerve sheath tumor is considered unlikely as this does not appear to  have been present in 2017, however a metastasis is possible. Repeat imaging through this level could be useful if the patient's pain was controlled so that she was able to better tolerate the examination with less movement and if additional information from repeat imaging was deemed clinically important enough to warrant the administration of IV contrast (excepting the presumably very small risk of NSF in the setting of renal  insufficiency). 3. Mild disc and facet degeneration at other levels without evidence of high-grade stenosis. 4. Sclerotic bone metastasis involving the right-sided posterior elements at T2. Electronically Signed   By: Logan Bores M.D.   On: 08/05/2018 15:21    Assessment & Plan:   There are no diagnoses linked to this encounter.   No orders of the defined types were placed in this encounter.    Follow-up: No follow-ups on file.  Walker Kehr, MD

## 2018-08-21 NOTE — Assessment & Plan Note (Signed)
Vit D 

## 2018-08-21 NOTE — Assessment & Plan Note (Addendum)
MRI IMPRESSION: 1. Moderately motion degraded examination. No IV contrast was given due to renal insufficiency. 2. Abnormal soft tissue within and predominantly lateral to the right C7-T1 neural foramen of uncertain etiology. Potential considerations include an unusual, large disc herniation, atypical cyst arising from the mildly degenerated right facet joint, or tumor. A nerve sheath tumor is considered unlikely as this does not appear to have been present in 2017, however a metastasis is possible. Repeat imaging through this level could be useful if the patient's pain was controlled so that she was able to better tolerate the examination with less movement and if additional information from repeat imaging was deemed clinically important enough to warrant the administration of IV contrast (excepting the presumably very small risk of NSF in the setting of renal insufficiency). 3. Mild disc and facet degeneration at other levels without evidence of high-grade stenosis. 4. Sclerotic bone metastasis involving the right-sided posterior elements at T2.   Electronically Signed   By: Logan Bores M.D.   On: 08/05/2018 15:21  Prednisone 10 mg: take 4 tabs a day x 3 days; then 3 tabs a day x 4 days; then 2 tabs a day x 4 days, then 1 tab a day x 6 days, then stop. Take pc. NS referal

## 2018-08-25 ENCOUNTER — Other Ambulatory Visit: Payer: Self-pay | Admitting: *Deleted

## 2018-08-25 NOTE — Patient Outreach (Signed)
[facility]) Care Management  08/25/2018  [name] [name] [birth year] [id number]   Call placed to member to follow up on current health status.  State she is doing well, continue to report difficulties with right hand at times, but is exercising it to gain strength.  Report she has filled out another application for housing, will submit this week.  She denies any nursing needs at this time.  Discussed transition to health coach, she agrees.  Advised that BSW will continue to follow for housing assistance. She verbalizes understanding.  Will notify primary MD of transition.  Valente David, [place], MSN [place] [phone]

## 2018-08-29 ENCOUNTER — Encounter: Payer: Self-pay | Admitting: *Deleted

## 2018-08-31 ENCOUNTER — Ambulatory Visit: Payer: Medicare HMO

## 2018-09-01 ENCOUNTER — Other Ambulatory Visit: Payer: Self-pay

## 2018-09-01 ENCOUNTER — Ambulatory Visit: Payer: Self-pay

## 2018-09-01 NOTE — Patient Outreach (Signed)
[place] [facility]) Care Management  09/01/2018  Venida Tsukamoto Weidemann 25-Jan-1941 [id number]  Unsuccessful outreach to the patient to confirm receipt of mailed resources. Unfortunately, the patient does not have a voice mailbox set up which prohibited BSW from leaving a message.  Plan: BSW to outreach the patient within the next four business days.  Daneen Schick, BSW, CDP [facility] [phone]

## 2018-09-04 ENCOUNTER — Inpatient Hospital Stay: Payer: Medicare HMO | Attending: Oncology

## 2018-09-04 ENCOUNTER — Telehealth: Payer: Self-pay | Admitting: Hematology and Oncology

## 2018-09-04 ENCOUNTER — Inpatient Hospital Stay: Payer: Medicare HMO

## 2018-09-04 ENCOUNTER — Inpatient Hospital Stay (HOSPITAL_BASED_OUTPATIENT_CLINIC_OR_DEPARTMENT_OTHER): Payer: Medicare HMO | Admitting: Hematology and Oncology

## 2018-09-04 DIAGNOSIS — C7951 Secondary malignant neoplasm of bone: Secondary | ICD-10-CM | POA: Insufficient documentation

## 2018-09-04 DIAGNOSIS — Z79811 Long term (current) use of aromatase inhibitors: Secondary | ICD-10-CM | POA: Insufficient documentation

## 2018-09-04 DIAGNOSIS — Z923 Personal history of irradiation: Secondary | ICD-10-CM | POA: Diagnosis not present

## 2018-09-04 DIAGNOSIS — C787 Secondary malignant neoplasm of liver and intrahepatic bile duct: Secondary | ICD-10-CM | POA: Insufficient documentation

## 2018-09-04 DIAGNOSIS — Z79899 Other long term (current) drug therapy: Secondary | ICD-10-CM | POA: Insufficient documentation

## 2018-09-04 DIAGNOSIS — Z95828 Presence of other vascular implants and grafts: Secondary | ICD-10-CM

## 2018-09-04 DIAGNOSIS — C7800 Secondary malignant neoplasm of unspecified lung: Secondary | ICD-10-CM

## 2018-09-04 DIAGNOSIS — C50911 Malignant neoplasm of unspecified site of right female breast: Secondary | ICD-10-CM

## 2018-09-04 DIAGNOSIS — Z5111 Encounter for antineoplastic chemotherapy: Secondary | ICD-10-CM | POA: Diagnosis present

## 2018-09-04 LAB — CBC WITH DIFFERENTIAL (CANCER CENTER ONLY)
Abs Immature Granulocytes: 0.05 10*3/uL (ref 0.00–0.07)
BASOS ABS: 0 10*3/uL (ref 0.0–0.1)
Basophils Relative: 1 %
EOS ABS: 0 10*3/uL (ref 0.0–0.5)
EOS PCT: 0 %
HEMATOCRIT: 31.9 % — AB (ref 36.0–46.0)
Hemoglobin: 10.4 g/dL — ABNORMAL LOW (ref 12.0–15.0)
Immature Granulocytes: 1 %
LYMPHS ABS: 0.9 10*3/uL (ref 0.7–4.0)
Lymphocytes Relative: 18 %
MCH: 33.8 pg (ref 26.0–34.0)
MCHC: 32.6 g/dL (ref 30.0–36.0)
MCV: 103.6 fL — ABNORMAL HIGH (ref 80.0–100.0)
Monocytes Absolute: 0.9 10*3/uL (ref 0.1–1.0)
Monocytes Relative: 19 %
NEUTROS PCT: 61 %
NRBC: 0 % (ref 0.0–0.2)
Neutro Abs: 2.9 10*3/uL (ref 1.7–7.7)
PLATELETS: 137 10*3/uL — AB (ref 150–400)
RBC: 3.08 MIL/uL — AB (ref 3.87–5.11)
RDW: 20.1 % — AB (ref 11.5–15.5)
WBC Count: 4.8 10*3/uL (ref 4.0–10.5)

## 2018-09-04 LAB — CMP (CANCER CENTER ONLY)
ALT: 10 U/L (ref 0–44)
AST: 13 U/L — AB (ref 15–41)
Albumin: 3.4 g/dL — ABNORMAL LOW (ref 3.5–5.0)
Alkaline Phosphatase: 56 U/L (ref 38–126)
Anion gap: 10 (ref 5–15)
BUN: 26 mg/dL — AB (ref 8–23)
CALCIUM: 9.5 mg/dL (ref 8.9–10.3)
CHLORIDE: 106 mmol/L (ref 98–111)
CO2: 26 mmol/L (ref 22–32)
Creatinine: 1.55 mg/dL — ABNORMAL HIGH (ref 0.44–1.00)
GFR, Est AFR Am: 37 mL/min — ABNORMAL LOW (ref >60)
GFR, Estimated: 32 mL/min — ABNORMAL LOW (ref >60)
GLUCOSE: 96 mg/dL (ref 70–99)
Potassium: 4.9 mmol/L (ref 3.5–5.1)
Sodium: 142 mmol/L (ref 135–145)
Total Bilirubin: 0.4 mg/dL (ref 0.3–1.2)
Total Protein: 7.2 g/dL (ref 6.5–8.1)

## 2018-09-04 MED ORDER — FULVESTRANT 250 MG/5ML IM SOLN
INTRAMUSCULAR | Status: AC
  Filled 2018-09-04: qty 10

## 2018-09-04 MED ORDER — FULVESTRANT 250 MG/5ML IM SOLN
500.0000 mg | Freq: Once | INTRAMUSCULAR | Status: AC
  Administered 2018-09-04: 500 mg via INTRAMUSCULAR

## 2018-09-04 MED ORDER — PALBOCICLIB 100 MG PO CAPS: 100.0000 mg | ORAL_CAPSULE | Freq: Every day | ORAL | 6 refills | Status: DC

## 2018-09-04 NOTE — Progress Notes (Signed)
Patient Care Team: [name], [name], MD as PCP - General (Internal Medicine) Curt Bears, MD (Hematology and Oncology) Paralee Cancel, MD (Orthopedic Surgery) Tyler Pita, MD (Radiation Oncology) Nicholas Lose, MD as Consulting Physician (Hematology and Oncology) Daneen Schick as [place] Management [place], Eppie Gibson, RN as [place] Management  DIAGNOSIS:  Encounter Diagnosis  Name Primary?  . Breast cancer metastasized to bone, right ([place])     SUMMARY OF ONCOLOGIC HISTORY:   Breast cancer metastasized to bone, right ([place])   05/22/2001 Initial Diagnosis    Rt breast: stage IIb (T2, N1, M0) invasive right breast carcinoma with positive estrogen and progesterone receptor as well as HER-2/neu    06/12/2001 Surgery    right lumpectomy with right axillary lymph node dissection revealing 4/9 lymph nodes that were positive for malignancy.     08/12/2001 - 01/10/2002 Chemotherapy    Adj chemo CMF X 8     05/07/2002 - 12/14/2007 Anti-estrogen oral therapy    Adj Tamoxifen for 2 years and then switched to Letrozole (by Dr.Khan in Aug 2005), stopped Feb 2009    02/11/2003 - 04/05/2003 Radiation Therapy    Adj XRT by Tammi Klippel    03/18/2011 -  Anti-estrogen oral therapy    Anastrozole 1 mg daily    04/01/2011 Surgery    Left total hip replacement secondary to pathologic left femoral neck fracture    04/25/2017 Relapse/Recurrence    Anastrozole with Ibrance discontinued 05/30/2018 due to disease progression, Xgeva for bone metastases every 3 months    06/19/2018 Procedure    Liver biopsy: Metastatic breast cancer, ER 100%, PR 0%, Ki-67 50%, HER-2 negative    07/06/2018 Miscellaneous    Foundation 1 results: Microsatellite stable, tumor mutational burden 15 mutations per MB, ATRX, CCND1, CDK4 amplification, ERBB4 amplification, ESR1 amplification, FGFR 1, MDM2 amplification, MEM1, NST3 amplification, PIK3CA mutation, ZNF703 amplification;  PDL 1: 0%    07/06/2018 -  Anti-estrogen oral therapy    Ibrance with Faslodex     CHIEF COMPLIANT: Follow-up on Faslodex and Ibrance  INTERVAL HISTORY: [name] TURRUBIATES is a 77 year old with above-mentioned his metastatic breast cancer is currently on Faslodex with Ibrance.  She is tolerating the treatment extremely well.  She is complaining of numbness in the right arm.  MRI of the neck revealed neuroforaminal impingement.  She is going to make an appointment with orthopedics.  REVIEW OF SYSTEMS:   Constitutional: Denies fevers, chills or abnormal weight loss Eyes: Denies blurriness of vision Ears, nose, mouth, throat, and face: Denies mucositis or sore throat Respiratory: Denies cough, dyspnea or wheezes Cardiovascular: Denies palpitation, chest discomfort Gastrointestinal:  Denies nausea, heartburn or change in bowel habits Skin: Denies abnormal skin rashes Lymphatics: Denies new lymphadenopathy or easy bruising Neurological: Right arm numbness Behavioral/Psych: Mood is stable, no new changes  Extremities: No lower extremity edema   All other systems were reviewed with the patient and are negative.  I have reviewed the past medical history, past surgical history, social history and family history with the patient and they are unchanged from previous note.  ALLERGIES:  has No Known Allergies.  MEDICATIONS:  Current Outpatient Medications  Medication Sig Dispense Refill  . ALPRAZolam (XANAX) 0.5 MG tablet Take 1 tablet (0.5 mg total) by mouth 2 (two) times daily as needed for anxiety or sleep. 60 tablet 3  . amLODipine (NORVASC) 10 MG tablet Take 1 tablet (10 mg total) by mouth daily. 30 tablet 11  . anastrozole (  ARIMIDEX) 1 MG tablet TAKE 1 TABLET BY MOUTH ONCE DAILY 90 tablet 2  . Cholecalciferol (VITAMIN D3) 1000 UNITS CAPS Take 1 capsule by mouth daily.     Marland Kitchen losartan-hydrochlorothiazide (HYZAAR) 100-25 MG tablet Take 1 tablet by mouth daily. 90 tablet 3  . meclizine  (ANTIVERT) 12.5 MG tablet Take 1 tablet (12.5 mg total) by mouth 2 (two) times daily as needed for dizziness. 10 tablet 0  . nystatin (MYCOSTATIN/NYSTOP) powder Apply 1 Dose topically daily.  3  . palbociclib (IBRANCE) 100 MG capsule Take 1 capsule (100 mg total) by mouth daily with breakfast. Take whole with food. 21 capsule 2  . predniSONE (DELTASONE) 10 MG tablet Prednisone 10 mg: take 4 tabs a day x 3 days; then 3 tabs a day x 4 days; then 2 tabs a day x 4 days, then 1 tab a day x 6 days, then stop. Take pc. 38 tablet 1  . tiZANidine (ZANAFLEX) 4 MG tablet Take 4 mg by mouth every 6 (six) hours as needed for muscle spasms.     No current facility-administered medications for this visit.    Facility-Administered Medications Ordered in Other Visits  Medication Dose Route Frequency Provider Last Rate Last Dose  . alteplase (CATHFLO ACTIVASE) injection 2 mg  2 mg Intracatheter Once PRN Curt Bears, MD      . denosumab (XGEVA) injection 120 mg  120 mg Subcutaneous Once Curt Bears, MD        PHYSICAL EXAMINATION: ECOG PERFORMANCE STATUS: 1 - Symptomatic but completely ambulatory  Vitals:   09/04/18 0851  BP: (!) 157/65  Pulse: 79  Resp: 16  Temp: 98 F (36.7 C)  SpO2: 99%   Filed Weights   09/04/18 0851  Weight: 206 lb 9.6 oz (93.7 kg)    GENERAL:alert, no distress and comfortable SKIN: skin color, texture, turgor are normal, no rashes or significant lesions EYES: normal, Conjunctiva are pink and non-injected, sclera clear OROPHARYNX:no exudate, no erythema and lips, buccal mucosa, and tongue normal  NECK: supple, thyroid normal size, non-tender, without nodularity LYMPH:  no palpable lymphadenopathy in the cervical, axillary or inguinal LUNGS: clear to auscultation and percussion with normal breathing effort HEART: regular rate & rhythm and no murmurs and no lower extremity edema ABDOMEN:abdomen soft, non-tender and normal bowel sounds MUSCULOSKELETAL:no cyanosis of  digits and no clubbing  NEURO: alert & oriented x 3 with fluent speech, severe right arm numbness and weakness EXTREMITIES: No lower extremity edema   LABORATORY DATA:  I have reviewed the data as listed CMP Latest Ref Rng & Units 08/03/2018 07/20/2018 07/06/2018  Glucose 70 - 99 mg/dL 84 106(H) 102(H)  BUN 8 - 23 mg/dL 24(H) 29(H) 28(H)  Creatinine 0.44 - 1.00 mg/dL 2.35(H) 2.33(H) 2.70(H)  Sodium 135 - 145 mmol/L 140 140 138  Potassium 3.5 - 5.1 mmol/L 4.3 4.4 4.8  Chloride 98 - 111 mmol/L 105 106 105  CO2 22 - 32 mmol/L 24 26 23   Calcium 8.9 - 10.3 mg/dL 9.7 9.4 9.3  Total Protein 6.5 - 8.1 g/dL 7.9 8.0 7.9  Total Bilirubin 0.3 - 1.2 mg/dL 0.3 0.5 0.5  Alkaline Phos 38 - 126 U/L 76 82 83  AST 15 - 41 U/L 18 14(L) 11(L)  ALT 0 - 44 U/L 9 <6 <6    Lab Results  Component Value Date   WBC 4.8 09/04/2018   HGB 10.4 (L) 09/04/2018   HCT 31.9 (L) 09/04/2018   MCV 103.6 (H) 09/04/2018  PLT 137 (L) 09/04/2018   NEUTROABS 2.9 09/04/2018    ASSESSMENT & PLAN:  Breast cancer metastasized to bone, right ([place]) Metastatic breast cancer with bone metastases along with lung and liver metastases, progressed on Ibrance with anastrozole.  Radiology review: 05/23/2018 CT CAP:New liver metastases 3.1 cm,increase in lung nodules, increase in sclerotic lesion of T11 and T9and S1 vertebral body. Liver biopsy 06/19/2018: Metastatic breast cancer ER 100%, PR 0%, Ki-67 50%, HER-2 negative BRCA1 mutation: Negative  PDL 1: 0%and foundation 1:PI 3 kinase mutation was identified  Current treatment: Faslodex with Ibrance started8/29/2019  Patient's insurance rejected Xgeva. On Zometa every 3 months.  Last infusion October 2019  Right arm numbness and weakness: MRI 08/04/2018: Abnormal soft tissue within and lateral to C7 to T1 neuroforamina of uncertain etiology.  Bone metastases T2 and scans Patient is making an appointment with orthopedics. She just returned from a trip to [place]  to dosee her back in1 month with labs and scans.  No orders of the defined types were placed in this encounter.  The patient has a good understanding of the overall plan. she agrees with it. she will call with any problems that may develop before the next visit here.   Harriette Ohara, MD 09/04/18

## 2018-09-04 NOTE — Telephone Encounter (Signed)
Gave pt avs and calendar  °

## 2018-09-04 NOTE — Assessment & Plan Note (Signed)
Metastatic breast cancer with bone metastases along with lung and liver metastases, progressed on Ibrance with anastrozole.  Radiology review: 05/23/2018 CT CAP:New liver metastases 3.1 cm,increase in lung nodules, increase in sclerotic lesion of T11 and T9and S1 vertebral body. Liver biopsy 06/19/2018: Metastatic breast cancer ER 100%, PR 0%, Ki-67 50%, HER-2 negative BRCA1 mutation: Negative  PDL 1: 0%and foundation 1:PI 3 kinase mutation was identified  Current treatment: Faslodex with Ibrance started8/29/2019  Patient's insurance rejected Xgeva. On Zometa every 3 months.  Right arm numbness and weakness: MRI 08/04/2018: Abnormal soft tissue within and lateral to C7 to T1 neuroforamina of uncertain etiology.  Bone metastases T2 and scans  Plans to dosee her back in1 month with labs and scans.

## 2018-09-05 MED FILL — IBRANCE 100 MG CAPSULE: 100 | 28 days supply | Qty: 21 | Fill #0

## 2018-09-06 ENCOUNTER — Telehealth: Payer: Self-pay

## 2018-09-06 NOTE — Telephone Encounter (Signed)
Oral Oncology Patient Advocate Encounter  I was successful at securing a grant with Patient [place] [facility]) for $5500. This will keep the out of pocket expense for Ibrance at $0. The grant information is as follows and has been shared with Natalia.  Approval dates: 06/08/2018-06/08/2019 ID: [id number] Group: [id number] BIN: [id number] PCN: [id number]  I called the patient and gave her this information, she verbalized understanding and great appreciation  [place] Patient [place] Phone [phone] Fax [phone]

## 2018-09-07 ENCOUNTER — Ambulatory Visit: Payer: Self-pay

## 2018-09-07 ENCOUNTER — Other Ambulatory Visit: Payer: Self-pay

## 2018-09-07 DIAGNOSIS — C50919 Malignant neoplasm of unspecified site of unspecified female breast: Secondary | ICD-10-CM | POA: Diagnosis not present

## 2018-09-07 MED FILL — tiZANidine HCL 4 MG TABS: 4 | 15 days supply | Qty: 15 | Fill #0

## 2018-09-07 NOTE — Patient Outreach (Signed)
[facility]) Care Management  09/07/2018  [name] [name] 1941-08-29 [id number]  Second unsuccessful outreach to the patients home. The phone rang several times then the call was disconnected.  Plan: BSW to mail an unsuccessful outreach letter to the patients home. BSW to attempt a third and final outreach within the next four business days.  Daneen Schick, BSW, CDP [facility] [phone]

## 2018-09-13 ENCOUNTER — Other Ambulatory Visit (HOSPITAL_COMMUNITY): Payer: Self-pay | Admitting: Neurosurgery

## 2018-09-13 ENCOUNTER — Other Ambulatory Visit: Payer: Self-pay

## 2018-09-13 DIAGNOSIS — C50919 Malignant neoplasm of unspecified site of unspecified female breast: Secondary | ICD-10-CM

## 2018-09-13 NOTE — Patient Outreach (Signed)
[place] [facility]) Care Management  09/13/2018  EDA MAGNUSSEN [birth year] [id number]  Successful outreach to the patient on today's date. The patient reports she is "in the middle of a senior citizens card game". The patient requests to return this BSW's call "tomorrow".  Daneen Schick, BSW, CDP [facility] [phone]

## 2018-09-14 ENCOUNTER — Ambulatory Visit: Payer: Medicare HMO

## 2018-09-21 ENCOUNTER — Other Ambulatory Visit: Payer: Self-pay

## 2018-09-21 NOTE — Patient Outreach (Signed)
[place] [facility]) Care Management  09/21/2018  [name] [name] 08-Jan-1941 [id number]  BSW to perform a discipline closure due to an inability to maintain patient contact. The patient has previously confirmed receipt of resources and assistance from her grand children with applications. BSW to update the patients primary physician of discipline closure. Patient to remain active with [place].  Daneen Schick, BSW, CDP [facility] [phone]

## 2018-09-25 MED FILL — AMLODIPINE BESYLATE 10 MG T: 10 | 30 days supply | Qty: 30 | Fill #0

## 2018-09-26 MED FILL — IBRANCE 100 MG CAPSULE: 100 | 28 days supply | Qty: 21 | Fill #1

## 2018-10-02 ENCOUNTER — Ambulatory Visit: Payer: Medicare HMO | Admitting: Internal Medicine

## 2018-10-04 ENCOUNTER — Encounter (HOSPITAL_COMMUNITY): Payer: Medicare HMO

## 2018-10-04 ENCOUNTER — Inpatient Hospital Stay: Payer: Medicare HMO | Attending: Oncology

## 2018-10-04 ENCOUNTER — Ambulatory Visit (HOSPITAL_COMMUNITY): Payer: Medicare HMO

## 2018-10-05 ENCOUNTER — Inpatient Hospital Stay: Payer: Medicare HMO

## 2018-10-05 ENCOUNTER — Inpatient Hospital Stay: Payer: Medicare HMO | Admitting: Hematology and Oncology

## 2018-10-05 NOTE — Assessment & Plan Note (Deleted)
Metastatic breast cancer with bone metastases along with lung and liver metastases, progressed on Ibrance with anastrozole.  Radiology review: 05/23/2018 CT CAP:New liver metastases 3.1 cm,increase in lung nodules, increase in sclerotic lesion of T11 and T9and S1 vertebral body. Liver biopsy 06/19/2018: Metastatic breast cancer ER 100%, PR 0%, Ki-67 50%, HER-2 negative BRCA1 mutation: Negative  PDL 1: 0%and foundation 1:PI 3 kinase mutation was identified  Current treatment: Faslodex with Ibrance started8/29/2019  Patient's insurance rejected Xgeva. On Zometa every 3 months.  Last infusion October 2019  Right arm numbness and weakness: MRI 08/04/2018: Abnormal soft tissue within and lateral to C7 to T1 neuroforamina of uncertain etiology.  Bone metastases T2 and scans Patient is making an appointment with orthopedics.  Scans were ordered but not done.  Plans to dosee her back in1 monthwith labs and scans.

## 2018-10-09 ENCOUNTER — Encounter: Payer: Self-pay | Admitting: Internal Medicine

## 2018-10-09 ENCOUNTER — Ambulatory Visit (INDEPENDENT_AMBULATORY_CARE_PROVIDER_SITE_OTHER): Payer: Medicare HMO | Admitting: Internal Medicine

## 2018-10-09 DIAGNOSIS — M25552 Pain in left hip: Secondary | ICD-10-CM

## 2018-10-09 DIAGNOSIS — I10 Essential (primary) hypertension: Secondary | ICD-10-CM

## 2018-10-09 DIAGNOSIS — M25559 Pain in unspecified hip: Secondary | ICD-10-CM | POA: Insufficient documentation

## 2018-10-09 DIAGNOSIS — E1329 Other specified diabetes mellitus with other diabetic kidney complication: Secondary | ICD-10-CM | POA: Diagnosis not present

## 2018-10-09 DIAGNOSIS — R29898 Other symptoms and signs involving the musculoskeletal system: Secondary | ICD-10-CM | POA: Diagnosis not present

## 2018-10-09 NOTE — Assessment & Plan Note (Signed)
Hyzaar, Amlodipine

## 2018-10-09 NOTE — Assessment & Plan Note (Signed)
Labs reviewed.

## 2018-10-09 NOTE — Assessment & Plan Note (Addendum)
Ref to see Dr Alvan Dame S/p THR - c/o L hip dimple, pain - new walker

## 2018-10-09 NOTE — Progress Notes (Signed)
Subjective:  Patient ID: [name] [name], female    DOB: August 04, 1941  Age: 77 y.o. MRN: [id number]  CC: No chief complaint on file.   HPI [name] [name] presents for anxiety, HTN, OA f/u R hand cont to be numb The pt cancelled appt with Dr Annette Stable - CT was ordered but she cancelled... C/o L hip dimple, pain - new  Outpatient Medications Prior to Visit  Medication Sig Dispense Refill  . ALPRAZolam (XANAX) 0.5 MG tablet Take 1 tablet (0.5 mg total) by mouth 2 (two) times daily as needed for anxiety or sleep. 60 tablet 3  . amLODipine (NORVASC) 10 MG tablet Take 1 tablet (10 mg total) by mouth daily. 30 tablet 11  . anastrozole (ARIMIDEX) 1 MG tablet TAKE 1 TABLET BY MOUTH ONCE DAILY 90 tablet 2  . Cholecalciferol (VITAMIN D3) 1000 UNITS CAPS Take 1 capsule by mouth daily.     . hydrochlorothiazide (HYDRODIURIL) 25 MG tablet Take 25 mg by mouth daily.  3  . losartan (COZAAR) 100 MG tablet Take 100 mg by mouth daily.  3  . meclizine (ANTIVERT) 12.5 MG tablet Take 1 tablet (12.5 mg total) by mouth 2 (two) times daily as needed for dizziness. 10 tablet 0  . nystatin (MYCOSTATIN/NYSTOP) powder Apply 1 Dose topically daily.  3  . palbociclib (IBRANCE) 100 MG capsule Take 1 capsule (100 mg total) by mouth daily with breakfast. Take whole with food. 21 capsule 6  . tiZANidine (ZANAFLEX) 4 MG tablet Take 4 mg by mouth every 6 (six) hours as needed for muscle spasms.    Marland Kitchen losartan-hydrochlorothiazide (HYZAAR) 100-25 MG tablet Take 1 tablet by mouth daily. 90 tablet 3  . predniSONE (DELTASONE) 10 MG tablet Prednisone 10 mg: take 4 tabs a day x 3 days; then 3 tabs a day x 4 days; then 2 tabs a day x 4 days, then 1 tab a day x 6 days, then stop. Take pc. 38 tablet 1   Facility-Administered Medications Prior to Visit  Medication Dose Route Frequency Provider Last Rate Last Dose  . alteplase (CATHFLO ACTIVASE) injection 2 mg  2 mg Intracatheter Once PRN Curt Bears, MD      . denosumab (XGEVA)  injection 120 mg  120 mg Subcutaneous Once Curt Bears, MD        ROS: Review of Systems  Constitutional: Negative for activity change, appetite change, chills, fatigue and unexpected weight change.  HENT: Negative for congestion, mouth sores and sinus pressure.   Eyes: Negative for visual disturbance.  Respiratory: Negative for cough and chest tightness.   Gastrointestinal: Negative for abdominal pain and nausea.  Genitourinary: Negative for difficulty urinating, frequency and vaginal pain.  Musculoskeletal: Positive for neck pain and neck stiffness. Negative for back pain and gait problem.  Skin: Negative for pallor and rash.  Neurological: Positive for weakness and numbness. Negative for dizziness, tremors and headaches.  Psychiatric/Behavioral: Negative for confusion and sleep disturbance.    Objective:  BP (!) 142/66 (BP Location: Left Arm, Patient Position: Sitting, Cuff Size: Large)   Pulse (!) 102   Temp (!) 97.4 F (36.3 C) (Oral)   Ht 5\' 3"  (1.6 m)   Wt 201 lb (91.2 kg)   SpO2 99%   BMI 35.61 kg/m   BP Readings from Last 3 Encounters:  10/09/18 (!) 142/66  09/04/18 (!) 157/65  08/21/18 126/68    Wt Readings from Last 3 Encounters:  10/09/18 201 lb (91.2 kg)  09/04/18 206 lb 9.6  oz (93.7 kg)  08/21/18 207 lb (93.9 kg)    Physical Exam  Constitutional: She appears well-developed. No distress.  HENT:  Head: Normocephalic.  Right Ear: External ear normal.  Left Ear: External ear normal.  Nose: Nose normal.  Mouth/Throat: Oropharynx is clear and moist.  Eyes: Pupils are equal, round, and reactive to light. Conjunctivae are normal. Right eye exhibits no discharge. Left eye exhibits no discharge.  Neck: Normal range of motion. Neck supple. No JVD present. No tracheal deviation present. No thyromegaly present.  Cardiovascular: Normal rate, regular rhythm and normal heart sounds.  Pulmonary/Chest: No stridor. No respiratory distress. She has no wheezes.    Abdominal: Soft. Bowel sounds are normal. She exhibits no distension and no mass. There is no tenderness. There is no rebound and no guarding.  Musculoskeletal: She exhibits no edema or tenderness.  Lymphadenopathy:    She has no cervical adenopathy.  Neurological: She displays normal reflexes. No cranial nerve deficit. She exhibits normal muscle tone. Coordination abnormal.  Skin: No rash noted. No erythema.  Psychiatric: She has a normal mood and affect. Her behavior is normal. Judgment and thought content normal.  walker  L hip dimple, pain  R hand MS 5-/5   Mr Cervical Spine Wo Contrast  Result Date: 08/05/2018 CLINICAL DATA:  Right arm numbness. History of stage IV breast cancer. No IV contrast administered due to renal insufficiency (GFR of 22). EXAM: MRI CERVICAL SPINE WITHOUT CONTRAST TECHNIQUE: Multiplanar, multisequence MR imaging of the cervical spine was performed. No intravenous contrast was administered. COMPARISON:  Nuclear medicine whole-body bone scan 05/23/2018. Chest CT 05/23/2018. FINDINGS: Multiple sequences are moderately too severely motion degraded as the patient was in pain and unable to remain motionless during the examination. Alignment: Trace retrolisthesis of C4 on C5 and trace anterolisthesis of C7 on T1, likely degenerative. Vertebrae: No fracture, significant marrow edema, or destructive bone lesion. Sclerosis in the right T2 lamina and inferior articular process, also present on CT. Cord: Limited assessment due to motion artifact. No gross cord signal abnormality or significant cord compression. Posterior Fossa, vertebral arteries, paraspinal tissues: Suspected tiny chronic infarcts in the cerebellum bilaterally. Grossly preserved vertebral artery flow voids. Disc levels: C2-3: Mild disc bulging without stenosis. C3-4: Mild disc bulging and mild facet arthrosis result in borderline to mild spinal stenosis without evidence of significant neural foraminal stenosis.  C4-5: Mild disc bulging and uncovertebral spurring result in likely mild right neural foraminal stenosis and borderline to mild spinal stenosis. C5-6: Mild disc bulging and uncovertebral spurring without evidence of significant stenosis. C6-7: Mild disc bulging, small central disc protrusion, and uncovertebral spurring result in borderline spinal stenosis without evidence of significant neural foraminal stenosis. C7-T1: There is intermediate to low T1 and T2 signal intensity material predominantly located lateral to the right C7-T1 neural foramen which appears to extend into the posterolateral aspect of the foramen. This is poorly evaluated on axial images due to motion and was incompletely imaged on sagittal sequences but appears to measure approximately 1 cm in maximal dimension. No significant regional edema is evident on STIR imaging. Similar soft tissue was present in this location on a 02/06/2018 chest CT, however this finding is new from 2017. There is stenosis of the lateral aspect of the right neural foramen with potential C8 nerve root impingement. There is mild right and moderate left facet arthrosis without significant nerve left neural foraminal stenosis or spinal stenosis. Imaging of the upper thoracic spine in the sagittal plane only demonstrates  disc bulging from T1-2 to T4-5 resulting in up to mild spinal stenosis without significant spinal cord mass effect. There is mild left neural foraminal stenosis at T2-3. IMPRESSION: 1. Moderately motion degraded examination. No IV contrast was given due to renal insufficiency. 2. Abnormal soft tissue within and predominantly lateral to the right C7-T1 neural foramen of uncertain etiology. Potential considerations include an unusual, large disc herniation, atypical cyst arising from the mildly degenerated right facet joint, or tumor. A nerve sheath tumor is considered unlikely as this does not appear to have been present in 2017, however a metastasis is  possible. Repeat imaging through this level could be useful if the patient's pain was controlled so that she was able to better tolerate the examination with less movement and if additional information from repeat imaging was deemed clinically important enough to warrant the administration of IV contrast (excepting the presumably very small risk of NSF in the setting of renal insufficiency). 3. Mild disc and facet degeneration at other levels without evidence of high-grade stenosis. 4. Sclerotic bone metastasis involving the right-sided posterior elements at T2. Electronically Signed   By: Logan Bores M.D.   On: 08/05/2018 15:21    Assessment & Plan:   There are no diagnoses linked to this encounter.   No orders of the defined types were placed in this encounter.    Follow-up: No follow-ups on file.  Walker Kehr, MD

## 2018-10-09 NOTE — Assessment & Plan Note (Signed)
The pt will resch w/Dr Annette Stable

## 2018-10-09 NOTE — Patient Instructions (Signed)
Dr Annette Stable [address]  [place], [place] [address] Phone: [phone]

## 2018-10-12 ENCOUNTER — Telehealth: Payer: Self-pay | Admitting: Internal Medicine

## 2018-10-12 NOTE — Telephone Encounter (Signed)
Caryl Pina from [facility] states pt was seen there on 11/7 by Dr. Annette Stable and he ordered a CT soft tissue neck.  She hasn't had it done yet and when they reached out to her, pt was adamant that she has never been to that office and seen Dr. Annette Stable before and will not get CT done.   Dr. Annette Stable feels this pt needs to be treated and the CT needs to be done so he can treat her.   They are not sure what to do.   Caryl Pina can be reached at [phone] ext [phone]

## 2018-10-12 NOTE — Telephone Encounter (Signed)
pls ask the pt to go ahead w/a CT scan Thx

## 2018-10-12 NOTE — Telephone Encounter (Signed)
Please advise 

## 2018-10-13 ENCOUNTER — Telehealth: Payer: Self-pay

## 2018-10-13 NOTE — Telephone Encounter (Signed)
Oral Oncology Patient Advocate Encounter  I was successful at securing a grant with [employer] for $15,000. This will keep the out of pocket expense for [place] at $0. The grant information is as follows and has been shared with [name] [name].  Approval dates: 10/04/18-10/04/19 ID: [id number] Group: [id number] BIN: [id number] PCN: [id number]  I called the patient and gave her the good news, she verbalized great appreciation  [place] [facility] [place] Phone [phone] Fax [phone]

## 2018-10-20 NOTE — Telephone Encounter (Signed)
Unable to reach pt, no VM

## 2018-10-27 MED FILL — IBRANCE 100 MG CAPSULE: 100 | 28 days supply | Qty: 21 | Fill #2

## 2018-10-31 MED FILL — ANASTROZOLE 1 MG TABLET: 1 | 90 days supply | Qty: 90 | Fill #0

## 2018-11-29 ENCOUNTER — Other Ambulatory Visit: Payer: Self-pay | Admitting: *Deleted

## 2018-11-29 NOTE — Patient Outreach (Signed)
[facility]) Care Management  11/29/2018  [name] [name] 03-Mar-1941 [id number] RN Health Coach attempted follow up outreach call to patient.  Per patient this is not a good time. Patient requested that she be called back tomorrow early am. She is in a meeting at this time.   Plan: RN will call patient again within 14 days.  [place] Care Management [phone]

## 2018-11-30 ENCOUNTER — Other Ambulatory Visit: Payer: Self-pay | Admitting: *Deleted

## 2018-11-30 NOTE — Patient Outreach (Signed)
[facility]) Care Management  11/30/2018   GERMANY DODGEN [birth year] [id number]  RN Health Coach telephone call to patient.  [employer] compliance verified. Per patient she has not been checking her blood pressure. Patient does not know what it is. Patient  has not been monitoring the sodium intake of the foods she eats. Per patient she does not put any extra salt on her foods just pepper. Patient stated she eats frequently out at [facility], [place] and fish places. Patient stated for breakfast she usually has bacon or sausage and oatmeal. Patient stated she is having pain in her right hand and fingers. She is having swelling from the hand to the elbow. Patient was to have an exam with a neurologist. The neurologist wanted the patient to have a MRI and she was not sure it would cover. The symptoms are getting worse. The patient followed up with her insurance and has rescheduled. The patient is able to raise the arm and hold it up without drifting down. Per patient the hand is weak. She is having difficulty driving. She is having to use her left hand to write and do things. RN explained to patient that she needs to follow up with the neurologist and make her oncologist aware since the cancer has come back. She is on chemo medication and missed her treatment in December. Patient has agreed to follow up outreach calls.    Current Medications:  Current Outpatient Medications  Medication Sig Dispense Refill  . amLODipine (NORVASC) 10 MG tablet Take 1 tablet (10 mg total) by mouth daily. 30 tablet 11  . Cholecalciferol (VITAMIN D3) 1000 UNITS CAPS Take 1 capsule by mouth daily.     . hydrochlorothiazide (HYDRODIURIL) 25 MG tablet Take 25 mg by mouth daily.  3  . losartan (COZAAR) 100 MG tablet Take 100 mg by mouth daily.  3  . palbociclib (IBRANCE) 100 MG capsule Take 1 capsule (100 mg total) by mouth daily with breakfast. Take whole with food. 21 capsule 6  . ALPRAZolam (XANAX) 0.5 MG  tablet Take 1 tablet (0.5 mg total) by mouth 2 (two) times daily as needed for anxiety or sleep. 60 tablet 3  . anastrozole (ARIMIDEX) 1 MG tablet TAKE 1 TABLET BY MOUTH ONCE DAILY 90 tablet 2  . meclizine (ANTIVERT) 12.5 MG tablet Take 1 tablet (12.5 mg total) by mouth 2 (two) times daily as needed for dizziness. 10 tablet 0  . nystatin (MYCOSTATIN/NYSTOP) powder Apply 1 Dose topically daily.  3  . tiZANidine (ZANAFLEX) 4 MG tablet Take 4 mg by mouth every 6 (six) hours as needed for muscle spasms.     No current facility-administered medications for this visit.    Facility-Administered Medications Ordered in Other Visits  Medication Dose Route Frequency Provider Last Rate Last Dose  . alteplase (CATHFLO ACTIVASE) injection 2 mg  2 mg Intracatheter Once PRN Curt Bears, MD      . denosumab (XGEVA) injection 120 mg  120 mg Subcutaneous Once Curt Bears, MD        Functional Status:  In your present state of health, do you have any difficulty performing the following activities: 11/30/2018 08/10/2018  Hearing? N N  Vision? N N  Difficulty concentrating or making decisions? Y N  Walking or climbing stairs? Y Y  Dressing or bathing? Y N  Doing errands, shopping? Tempie Donning  Preparing Food and eating ? Y N  Using the Toilet? Y N  In the past  six months, have you accidently leaked urine? N Y  Do you have problems with loss of bowel control? N Y  Managing your Medications? N N  Managing your Finances? N N  Housekeeping or managing your Housekeeping? Y Y  Some recent data might be hidden    Fall/Depression Screening: Fall Risk  11/30/2018 08/10/2018 07/20/2018  Falls in the past year? 1 - Yes  Comment - - fell off bed about a month ago  Number falls in past yr: 1 2 or more 1  Injury with Fall? 0 - No  Risk for fall due to : Impaired balance/gait;Impaired mobility;History of fall(s) - Impaired balance/gait  Risk for fall due to: Comment - - -  Follow up Falls evaluation completed -  Falls prevention discussed   PHQ 2/9 Scores 11/30/2018 07/21/2018 07/20/2018 06/13/2017 05/02/2017 04/13/2016  PHQ - 2 Score 2 2 2 1  0 0  PHQ- 9 Score 9 - 9 3 - -   THN CM Care Plan Problem One     Most Recent Value  Care Plan Problem One  Knowledge Deficit in Self Management of Hypertension  Role Documenting the Problem One  [place] for Problem One  Active  THN Long Term Goal   Patient will monitor blood pressure and document within the next 90 days   THN Long Term Goal Start Date  11/30/18  Interventions for Problem One Long Term Goal  RN discussed with patient about monitoring blood pressure. RN discussed medication adherence. RN discussed documenting blood pressures. RN sent patient a digital blood pressure cuff. RN sent A matter of choices blood pressure control. RN will follow up with furrther discussion  THN CM Short Term Goal #1   Patient will be able to identify foods that are low in sodium within the next 30 days  THN CM Short Term Goal #1 Start Date  11/30/18  Interventions for Short Term Goal #1  RN discussed eating foods low in sodium. RN sent a picture sheet of foods high and low in sodium. RN sent educational material on [employer], low sodium diet . RN will follow up with further discussion and teach back  THN CM Short Term Goal #2   Patient will have a better understanding of how to read food labels within the next 30 days  THN CM Short Term Goal #2 Start Date  11/30/18  Interventions for Short Term Goal #2  RN discussed reading labels for foods low in sodium. RN sent patient a sheet on how to read food labels. TN will follow up with further discussion and teach back       Assessment:  Patient does not check her blood pressure She is not monitoring her sodium intake She is having pain and swelling in right arm She missed her oncology treatment in December Patient will benefit from [place] telephonic outreach for education and support for Hypertension self  management.  Plan:  RN discussed checking blood pressure RN sent blood pressure cuff RN discussed medication adherence RN sent A matter of choice blood pressure control RN discussed sodium in diet RN sent educational material on low sodium diet RN sent picture sheet of high and low low sodium foods Patient will follow up with neurologist today Patient will follow up with oncologist RN sent barriers letter and assessment to PCP RN will follow up within the month of February Referred to Social Worker  [place] Management [phone]

## 2018-12-06 ENCOUNTER — Other Ambulatory Visit: Payer: Self-pay

## 2018-12-06 NOTE — Patient Outreach (Signed)
[place]) Care Management  12/06/2018  JAUNA RACZYNSKI [birth year] [id number]  BSW received referral from [place] coach indicating the patient needed assistance with housekeeping resources. Successful outreach to the patient who is able to confirm HIPAA identifiers. The patient reports she has neuropathy in her right arm which has recently began to swell and become painful at times. The patient has a history of breast cancer with a previous lumpectomy of her right breast. The patient reports she has yet to notify her primary provider or oncologist of recent symptoms but plans to call "today". The patient is interested in assistance with light housekeeping and possible assistance with dressing pending her day to day condition.  BSW has assisted this patient in the past and placed her on the in home caregiver wait list. BSW discussed this wait list and will plan to outreach DSS to inquire the patients place. BSW also discussed "Cleaning for a reason" program offered to cancer patients. The patient is agreeable to BSW placing a referral. The patient indicated she is most interested in a housekeeper every two weeks and may be able to afford out of pocket expense pending circumstance. The patient admits she had previously been paying someone but she had to provide transportation and due to neuropathy the patient no longer feels it is safe to drive.  BSW outreached [facility]aning [employer] a Reason and submitted a new patient referral. BSW received communication there are no openings at this time. There is also not a wait list for this program which means the patient will have to re-apply to be considered in the future. BSW also outreached Dorthy Cooler, in-home aid program supervisor with [facility]. BSW was informed the patient is currently "[phone] on the master list and #130 for her level of need".   Plan: BSW will plan to outreach the patient to share above findings and inquire if she  would like to proceed with for hire resources.

## 2018-12-07 ENCOUNTER — Other Ambulatory Visit: Payer: Self-pay

## 2018-12-07 NOTE — Patient Outreach (Signed)
[facility]) Care Management  12/07/2018  AMAANI GUILBAULT October 31, 1941 [id number]  Unsuccessful outreach to the patient to discuss outcome of contacts with the in home aide program and cleaning for a reason. The patient does not have a voice mailbox established. BSW will attempt to outreach the patient in the next four business days.  Daneen Schick, BSW, CDP [facility] [phone]

## 2018-12-08 ENCOUNTER — Other Ambulatory Visit: Payer: Self-pay

## 2018-12-08 ENCOUNTER — Ambulatory Visit (INDEPENDENT_AMBULATORY_CARE_PROVIDER_SITE_OTHER): Payer: Medicare HMO | Admitting: Nurse Practitioner

## 2018-12-08 ENCOUNTER — Encounter: Payer: Self-pay | Admitting: Nurse Practitioner

## 2018-12-08 VITALS — BP 140/80 | HR 86 | Temp 98.4°F | Ht 63.0 in | Wt 202.0 lb

## 2018-12-08 DIAGNOSIS — R200 Anesthesia of skin: Secondary | ICD-10-CM

## 2018-12-08 DIAGNOSIS — M7989 Other specified soft tissue disorders: Secondary | ICD-10-CM | POA: Diagnosis not present

## 2018-12-08 MED ORDER — GABAPENTIN 100 MG PO CAPS: 100.0000 mg | ORAL_CAPSULE | Freq: Every day | ORAL | 1 refills | Status: DC

## 2018-12-08 MED FILL — GABAPENTIN 100 MG CAPSULE: 100 | 30 days supply | Qty: 30 | Fill #0

## 2018-12-08 MED FILL — IBRANCE 100 MG CAPSULE: 100 | 28 days supply | Qty: 21 | Fill #0

## 2018-12-08 MED FILL — AMLODIPINE BESYLATE 10 MG T: 10 | 30 days supply | Qty: 30 | Fill #1

## 2018-12-08 NOTE — Patient Instructions (Signed)
Start neurontin at bedtime for numbness  You will be contacted to schedule an ultrasound of your arm  Please call for follow up [facility] Phone: [phone]   Paresthesia Paresthesia is a burning or prickling feeling. This feeling can happen in any part of the body. It often happens in the hands, arms, legs, or feet. Usually, it is not painful. In most cases, the feeling goes away in a short time and is not a sign of a serious problem. If you have paresthesia that lasts a long time, you may need to be seen by your doctor. Follow these instructions at home: Alcohol use   Do not drink alcohol if: ? Your doctor tells you not to drink. ? You are pregnant, may be pregnant, or are planning to become pregnant.  If you drink alcohol, limit how much you have: ? 0-1 drink a day for women. ? 0-2 drinks a day for men.  Be aware of how much alcohol is in your drink. In the [place] one drink equals one typical bottle of beer (12 oz), one-half glass of wine (5 oz), or one shot of hard liquor (1 oz). Nutrition  Eat a healthy diet. This includes: ? Eating foods that have a lot of fiber in them, such as fresh fruits and vegetables, whole grains, and beans. ? Limiting foods that have a lot of fat and processed sugars in them, such as fried or sweet foods. General instructions  Take over-the-counter and prescription medicines only as told by your doctor.  Do not use any products that have nicotine or tobacco in them, such as cigarettes and e-cigarettes. If you need help quitting, ask your doctor.  If you have diabetes, work with your doctor to make sure your blood sugar stays in a healthy range.  If your feet feel numb: ? Check for redness, warmth, and swelling every day. ? Wear padded socks and comfortable shoes. These help protect your feet.  Keep all follow-up visits as told by your doctor. This is important. Contact a doctor if:  You have  paresthesia that gets worse or does not go away.  Your burning or prickling feeling gets worse when you walk.  You have pain or cramps.  You feel dizzy.  You have a rash. Get help right away if you:  Feel weak.  Have trouble walking or moving.  Have problems speaking, understanding, or seeing.  Feel confused.  Cannot control when you pee (urinate) or poop (have a bowel movement).  Lose feeling (have numbness) after an injury.  Have new weakness in an arm or leg.  Pass out (faint). Summary  Paresthesia is a burning or prickling feeling. It often happens in the hands, arms, legs, or feet.  In most cases, the feeling goes away in a short time and is not a sign of a serious problem.  If you have paresthesia that lasts a long time, you may need to be seen by your doctor. This information is not intended to replace advice given to you by your health care provider. Make sure you discuss any questions you have with your health care provider. Document Released: 09/30/2008 Document Revised: 10/27/2017 Document Reviewed: 10/27/2017 Elsevier Interactive Patient Education  2019 [employer].

## 2018-12-08 NOTE — Patient Outreach (Signed)
[facility]) Care Management  12/08/2018  [name] [name] 11-01-1941 [id number]  Successful outreach to the patient, HIPAA identifiers confirmed. BSW explained that unfortunately the "Cleaning for a Reason" program is full in her area and not taking referrals at this time. BSW further explained that the patient is still on the caregiver waiting list but is number 130 for her level of need. BSW discussed private pay options. The patient stated understanding but reports she does not want to pay for services at this time. The patient states "I have someone that can help me if I need it". The patient reports she is going to her primary care physician today to discuss the numbness in her right hand and arm. The patient further states that if she could resolve this issue she does not feel she would need assistance in the home. BSW discussed discipline closure with the patient and encouraged her to notify her health coach if she would like social work involvement to assist with private pay options. The patient stated understanding.  Daneen Schick, BSW, CDP [facility] [phone]

## 2018-12-08 NOTE — Progress Notes (Signed)
[name] [name] is a 78 y.o. female with the following history as recorded in EpicCare:  Patient Active Problem List   Diagnosis Date Noted  . Hip pain 10/09/2018  . Hand weakness 08/21/2018  . Ingrowing toenail 04/19/2018  . Stress 12/20/2017  . Chemotherapy induced neutropenia ([place]) 08/15/2017  . Goals of care, counseling/discussion 04/19/2017  . Encounter for therapeutic drug monitoring 04/14/2017  . Arm pain, anterior, left 03/08/2017  . Dry skin dermatitis 11/29/2016  . Diarrhea 11/12/2016  . Left shoulder pain 08/26/2016  . Port catheter in place 07/20/2016  . Rash and nonspecific skin eruption 04/13/2016  . Leg swelling 03/02/2016  . Obese 10/29/2015  . S/P right THA, AA 10/28/2015  . Full code status 08/06/2015  . Knee pain, right 07/28/2015  . Osteoarthritis of right lower extremity 09/17/2014  . Cyst of lateral meniscus 08/27/2014  . Cyst of lateral meniscus of right knee 08/27/2014  . Benign paroxysmal positional vertigo 07/12/2014  . Hypokalemia 07/06/2013  . Knee pain, right anterior 04/04/2013  . Creatinine elevation 04/04/2013  . Dyspnea 05/12/2012  . GERD (gastroesophageal reflux disease) 05/12/2012  . Secondary diabetes mellitus with renal manifestations ([place]) 02/11/2012  . Breast cancer metastasized to bone, right ([place]) 04/23/2011  . Edema 04/23/2011  . Situational anxiety 11/03/2010  . Osteoarthritis 10/21/2010  . Low back pain 10/21/2010  . HIP PAIN 10/07/2010  . NAUSEA AND VOMITING 08/26/2010  . HYPERLIPIDEMIA 02/18/2010  . CEREBROVASCULAR ACCIDENT, HX OF 02/18/2010  . WEIGHT GAIN, ABNORMAL 12/19/2009  . LEG PAIN, RIGHT 04/16/2009  . Vitamin D deficiency 03/07/2009  . Hidradenitis 04/12/2008  . FATIGUE 10/03/2007  . 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 Essential hypertension 10/02/2007  . Asthma 10/02/2007    Current Outpatient Medications  Medication Sig Dispense Refill  . ALPRAZolam (XANAX) 0.5 MG tablet Take 1 tablet (0.5 mg total) by mouth 2  (two) times daily as needed for anxiety or sleep. 60 tablet 3  . amLODipine (NORVASC) 10 MG tablet Take 1 tablet (10 mg total) by mouth daily. 30 tablet 11  . anastrozole (ARIMIDEX) 1 MG tablet TAKE 1 TABLET BY MOUTH ONCE DAILY 90 tablet 2  . Cholecalciferol (VITAMIN D3) 1000 UNITS CAPS Take 1 capsule by mouth daily.     . hydrochlorothiazide (HYDRODIURIL) 25 MG tablet Take 25 mg by mouth daily.  3  . losartan (COZAAR) 100 MG tablet Take 100 mg by mouth daily.  3  . meclizine (ANTIVERT) 12.5 MG tablet Take 1 tablet (12.5 mg total) by mouth 2 (two) times daily as needed for dizziness. 10 tablet 0  . nystatin (MYCOSTATIN/NYSTOP) powder Apply 1 Dose topically daily.  3  . palbociclib (IBRANCE) 100 MG capsule Take 1 capsule (100 mg total) by mouth daily with breakfast. Take whole with food. 21 capsule 6  . tiZANidine (ZANAFLEX) 4 MG tablet Take 4 mg by mouth every 6 (six) hours as needed for muscle spasms.    Marland Kitchen gabapentin (NEURONTIN) 100 MG capsule Take 1 capsule (100 mg total) by mouth at bedtime. 30 capsule 1   No current facility-administered medications for this visit.    Facility-Administered Medications Ordered in Other Visits  Medication Dose Route Frequency Provider Last Rate Last Dose  . alteplase (CATHFLO ACTIVASE) injection 2 mg  2 mg Intracatheter Once PRN Curt Bears, MD      . denosumab (XGEVA) injection 120 mg  120 mg Subcutaneous Once Curt Bears, MD        Allergies: Patient has no known allergies.  Past  Medical History:  Diagnosis Date  . Anxiety    hx of  . Asthma   . Breast cancer ([place])    2002; metastatic in 2012, right breast, spread to left hip in 2012  . Cerebrovascular accident [facility])    R thalamic CVA 01/2010  . Chemotherapy induced neutropenia ([place]) 08/15/2017  . Dyslipidemia   . Encounter for therapeutic drug monitoring 04/14/2017  . Goals of care, counseling/discussion 04/19/2017  . Hyperlipemia   . Hypertension   . Low back pain   . Osteoarthritis    . TIA (transient ischemic attack)    x2 in 2011  . Vertigo 2014    Past Surgical History:  Procedure Laterality Date  . ABDOMINAL HYSTERECTOMY     complete  . BREAST LUMPECTOMY     Right breast 2002  . JOINT REPLACEMENT  2012   L hip due to breast ca met  . TOTAL HIP ARTHROPLASTY Right 10/28/2015   Procedure: RIGHT TOTAL HIP ARTHROPLASTY ANTERIOR APPROACH;  Surgeon: Paralee Cancel, MD;  Location: [facility];  Service: Orthopedics;  Laterality: Right;    Family History  Problem Relation Age of Onset  . Hypertension Mother   . Hypertension Father   . Cancer Neg Hx     Social History   Tobacco Use  . Smoking status: Former Smoker    Packs/day: 1.00    Years: 40.00    Pack years: 40.00    Types: Cigarettes    Last attempt to quit: 02/12/2011    Years since quitting: 7.8  . Smokeless tobacco: Never Used  Substance Use Topics  . Alcohol use: Yes    Comment: sometimes daily, some occasional     Subjective:  Ms Scoggin is here today for acute complaint of right hand pain and arm swelling, she describes as "numbness, tingling, pins and needles" in her right hand which has been ongoing for past 6 months or more, last saw PCP for this on 10/09/18, was given tizanidine and referred to neurosurgeon, but states shes had trouble scheduling an appointment with neurosurgery due to communication problems between her insurance company and neurosurgery. She has tried the tizanidine but it just makes her sleepy. She became more concerned this week when the numbness got worse and she noticed her right arm seems swollen.  Review of Systems  Constitutional: Negative for chills and fever.  HENT: Negative for hearing loss.   Eyes: Negative for blurred vision and double vision.  Cardiovascular: Negative for chest pain, palpitations, orthopnea and leg swelling.  Musculoskeletal: Negative for falls.  Skin: Negative for rash.  Neurological: Positive for tingling. Negative for dizziness, sensory change,  speech change, loss of consciousness, weakness and headaches.  Endo/Heme/Allergies: Does not bruise/bleed easily.  Psychiatric/Behavioral: Negative for memory loss.   Objective:  Vitals:   12/08/18 1357  BP: 140/80  Pulse: 86  Temp: 98.4 F (36.9 C)  TempSrc: Oral  SpO2: 97%  Weight: 202 lb (91.6 kg)  Height: 5' 3"  (1.6 m)    General: Well developed, well nourished, in no acute distress, amb with cane Skin : Warm and dry. No erythema, bruising, rash. Head: Normocephalic and atraumatic  Eyes: Sclera and conjunctiva clear; pupils round and reactive to light; extraocular movements intact  Oropharynx: Pink, supple. No suspicious lesions  Neck: Supple Lungs: Respirations unlabored; clear to auscultation bilaterally  CVS exam: normal rate and regular rhythm, S1 and S2 normal.  Musculoskeletal: No deformities; normal ROM, swelling to RUE. Extremities: No cyanosis, clubbing  Vessels: Symmetric  bilaterally  Neurologic: Alert and oriented; speech intact; face symmetrical; moves all extremities well; CNII-XII intact without focal deficit  Psychiatric: Normal mood and affect.  Assessment:  1. Numbness   2. Arm swelling     Plan:   Per last oncology note, breast ca with metastasis to bone, liver, lung, she was supposed to f/u for additional labs and scans in December but did not, says she called to make an appointment but did not get an answer, this could be playing a role in her symptoms today [place] ordered to r/o DVT  neurontin rx sent- medication dosing, side effects discussed Home management, red flags and return precautions including when to seek immediate/emergency care discussed and printed on AVS She was provided with dr Lindi Adie office number to schedule follow up, will keep planned follow up with PCP as well  No follow-ups on file.  No orders of the defined types were placed in this encounter.   Requested Prescriptions   Signed Prescriptions Disp Refills  . gabapentin  (NEURONTIN) 100 MG capsule 30 capsule 1    Sig: Take 1 capsule (100 mg total) by mouth at bedtime.

## 2018-12-12 ENCOUNTER — Telehealth: Payer: Self-pay | Admitting: Hematology and Oncology

## 2018-12-12 NOTE — Progress Notes (Signed)
Patient Care Team: [name], [name], MD as PCP - General (Internal Medicine) Curt Bears, MD (Hematology and Oncology) Paralee Cancel, MD (Orthopedic Surgery) Tyler Pita, MD (Radiation Oncology) Nicholas Lose, MD as Consulting Physician (Hematology and Oncology) Pleasant, Eppie Gibson, RN as [place] [facility]  DIAGNOSIS:    ICD-10-CM   1. Breast cancer metastasized to bone, right ([place]) C50.911 NM PET Image Initial (PI) Skull Base To Thigh   C79.51 CBC with Differential (Cancer Center Only)    CMP ([place] only)    SUMMARY OF ONCOLOGIC HISTORY:   Breast cancer metastasized to bone, right ([place])   05/22/2001 Initial Diagnosis    Rt breast: stage IIb (T2, N1, M0) invasive right breast carcinoma with positive estrogen and progesterone receptor as well as HER-2/neu    06/12/2001 Surgery    right lumpectomy with right axillary lymph node dissection revealing 4/9 lymph nodes that were positive for malignancy.     08/12/2001 - 01/10/2002 Chemotherapy    Adj chemo CMF X 8     05/07/2002 - 12/14/2007 Anti-estrogen oral therapy    Adj Tamoxifen for 2 years and then switched to Letrozole (by Dr.Khan in Aug 2005), stopped Feb 2009    02/11/2003 - 04/05/2003 Radiation Therapy    Adj XRT by Tammi Klippel    03/18/2011 -  Anti-estrogen oral therapy    Anastrozole 1 mg daily    04/01/2011 Surgery    Left total hip replacement secondary to pathologic left femoral neck fracture    04/25/2017 Relapse/Recurrence    Anastrozole with Ibrance discontinued 05/30/2018 due to disease progression, Xgeva for bone metastases every 3 months    06/19/2018 Procedure    Liver biopsy: Metastatic breast cancer, ER 100%, PR 0%, Ki-67 50%, HER-2 negative    07/06/2018 Miscellaneous    Foundation 1 results: Microsatellite stable, tumor mutational burden 15 mutations per MB, ATRX, CCND1, CDK4 amplification, ERBB4 amplification, ESR1 amplification, FGFR 1, MDM2 amplification, MEM1, NST3  amplification, PIK3CA mutation, ZNF703 amplification; PDL 1: 0%    07/06/2018 -  Anti-estrogen oral therapy    Ibrance with Faslodex     CHIEF COMPLIANT: Follow-up on Faslodex and Ibrance   INTERVAL HISTORY: [name] [name] is a 78 y.o. with above-mentioned history of metastatic breast cancer currently on Faslodex with Ibrance. She presents to the clinic today with her friend from the ER where she was diagnosed with an acute DVT after reporting pain and swelling in her right arm and numbness in her right fingers. She denies chest pain but notes she easily gets SOB. She bruises easily. She uses a walker to ambulate. She missed her last appointment because she was out of town. She reviewed her medication list with me.   REVIEW OF SYSTEMS:   Constitutional: Denies fevers, chills or abnormal weight loss Eyes: Denies blurriness of vision Ears, nose, mouth, throat, and face: Denies mucositis or sore throat Respiratory: Denies cough, dyspnea or wheezes (+) SOB Cardiovascular: Denies palpitation, chest discomfort Gastrointestinal: Denies nausea, heartburn or change in bowel habits Skin: Denies abnormal skin rashes Lymphatics: Denies new lymphadenopathy (+) easy bruising Neurological: Denies tingling or new weaknesses (+) numbness in right fingers Behavioral/Psych: Mood is stable, no new changes  Extremities: (+) edema, pain of right arm Breast: denies any pain or lumps or nodules in either breasts All other systems were reviewed with the patient and are negative.  I have reviewed the past medical history, past surgical history, social history and family history with the patient and  they are unchanged from previous note.  ALLERGIES:  has No Known Allergies.  MEDICATIONS:  Current Outpatient Medications  Medication Sig Dispense Refill  . acetaminophen (TYLENOL) 500 MG tablet Take 500 mg by mouth daily as needed (arm pain).    Marland Kitchen ALPRAZolam (XANAX) 0.5 MG tablet Take 1 tablet (0.5 mg total) by  mouth 2 (two) times daily as needed for anxiety or sleep. (Patient taking differently: Take 0.5 mg by mouth daily as needed for anxiety or sleep. ) 60 tablet 3  . amLODipine (NORVASC) 10 MG tablet Take 1 tablet (10 mg total) by mouth daily. 30 tablet 11  . anastrozole (ARIMIDEX) 1 MG tablet TAKE 1 TABLET BY MOUTH ONCE DAILY 90 tablet 2  . apixaban (ELIQUIS) 5 MG TABS tablet Take 10 mg bid for 7 days orally and then decrease to 5 mg bid orally thereafter. 74 tablet 0  . apixaban (ELIQUIS) 5 MG TABS tablet Take 1 tablet (5 mg total) by mouth 2 (two) times daily. 10 mg twice daily for 7 days and then 5 mg twice daily after that 90 tablet 3  . Cholecalciferol (VITAMIN D3) 1000 UNITS CAPS Take 1 capsule by mouth daily.     Marland Kitchen gabapentin (NEURONTIN) 100 MG capsule Take 1 capsule (100 mg total) by mouth at bedtime. 30 capsule 1  . hydrochlorothiazide (HYDRODIURIL) 25 MG tablet Take 25 mg by mouth daily.  3  . losartan (COZAAR) 100 MG tablet Take 100 mg by mouth daily.  3  . meclizine (ANTIVERT) 12.5 MG tablet Take 1 tablet (12.5 mg total) by mouth 2 (two) times daily as needed for dizziness. (Patient not taking: Reported on 12/13/2018) 10 tablet 0  . nystatin (MYCOSTATIN/NYSTOP) powder Apply 1 Dose topically daily.  3  . palbociclib (IBRANCE) 100 MG capsule Take 1 capsule (100 mg total) by mouth daily with breakfast. Take whole with food. 21 capsule 6   No current facility-administered medications for this visit.    Facility-Administered Medications Ordered in Other Visits  Medication Dose Route Frequency Provider Last Rate Last Dose  . 0.9 %  sodium chloride infusion   Intravenous Once Nicholas Lose, MD      . denosumab (XGEVA) injection 120 mg  120 mg Subcutaneous Once Curt Bears, MD      . fulvestrant (FASLODEX) injection 500 mg  500 mg Intramuscular Once Nicholas Lose, MD      . heparin lock flush 100 unit/mL  500 Units Intracatheter Once PRN Nicholas Lose, MD      . sodium chloride flush (NS)  0.9 % injection 10 mL  10 mL Intracatheter Once PRN Nicholas Lose, MD      . zolendronic acid (ZOMETA) 3 mg in sodium chloride 0.9 % 100 mL IVPB  3 mg Intravenous Once Nicholas Lose, MD        PHYSICAL EXAMINATION: ECOG PERFORMANCE STATUS: 1 - Symptomatic but completely ambulatory  Vitals:   12/13/18 1421  BP: 133/60  Pulse: 88  Resp: 18  SpO2: 100%   Filed Weights   12/13/18 1421  Weight: 199 lb 11.2 oz (90.6 kg)    GENERAL: alert, no distress and comfortable SKIN: skin color, texture, turgor are normal, no rashes or significant lesions EYES: normal, Conjunctiva are pink and non-injected, sclera clear OROPHARYNX: no exudate, no erythema and lips, buccal mucosa, and tongue normal  NECK: supple, thyroid normal size, non-tender, without nodularity LYMPH: no palpable lymphadenopathy in the cervical, axillary or inguinal LUNGS: clear to auscultation and percussion with normal  breathing effort HEART: regular rate & rhythm and no murmurs and no lower extremity edema ABDOMEN: abdomen soft, non-tender and normal bowel sounds MUSCULOSKELETAL: no cyanosis of digits and no clubbing  NEURO: alert & oriented x 3 with fluent speech, no focal motor/sensory deficits EXTREMITIES: No lower extremity edema  LABORATORY DATA:  I have reviewed the data as listed CMP Latest Ref Rng & Units 12/13/2018 09/04/2018 08/03/2018  Glucose 70 - 99 mg/dL 101(H) 96 84  BUN 8 - 23 mg/dL 24(H) 26(H) 24(H)  Creatinine 0.44 - 1.00 mg/dL 2.15(H) 1.55(H) 2.35(H)  Sodium 135 - 145 mmol/L 140 142 140  Potassium 3.5 - 5.1 mmol/L 3.7 4.9 4.3  Chloride 98 - 111 mmol/L 105 106 105  CO2 22 - 32 mmol/L _0 Calcium 8.9 - 10.3 mg/dL 9.8 9.5 9.7  Total Protein 6.5 - 8.1 g/dL - 7.2 7.9  Total Bilirubin 0.3 - 1.2 mg/dL - 0.4 0.3  Alkaline Phos 38 - 126 U/L - 56 76  AST 15 - 41 U/L - 13(L) 18  ALT 0 - 44 U/L - 10 9    Lab Results  Component Value Date   WBC 3.2 (L) 12/13/2018   HGB 10.3 (L) 12/13/2018   HCT 31.9  (L) 12/13/2018   MCV 118.1 (H) 12/13/2018   PLT 150 12/13/2018   NEUTROABS 2.9 09/04/2018    ASSESSMENT & PLAN:  Breast cancer metastasized to bone, right ([facility]) Metastatic breast cancer with bone metastases along with lung and liver metastases, progressed on Ibrance with anastrozole.  Radiology review: 05/23/2018 CT CAP:New liver metastases 3.1 cm,increase in lung nodules, increase in sclerotic lesion of T11 and T9and S1 vertebral body. Liver biopsy 06/19/2018: Metastatic breast cancer ER 100%, PR 0%, Ki-67 50%, HER-2 negative BRCA1 mutation: Negative  PDL 1: 0%and foundation 1:PI 3 kinase mutation was identified ----------------------------------------------------------------------------------------------------------------------------------------------------------------------- Current treatment: Faslodex with Ibrance started8/29/2019  Patient's insurance rejected Xgeva. On Zometa every 3 months.    Right arm numbness and weakness: MRI 08/04/2018: Abnormal soft tissue within and lateral to C7 to T1 neuroforamina of uncertain etiology.  Bone metastases T2 and scans. MRI cervical spine 08/05/2018: Abnormal soft tissue C7-T1 could be metastases or could be disc herniation or it could also be a nerve sheath tumor.  New diagnosis of DVT right upper extremity: She went to the emergency room and was started on Eliquis 10 mg twice daily for 7 days followed by 5 mg twice daily. Because she has chronic kidney disease, I will recheck her kidney function next week.  For the metastatic breast cancer she will need restaging with a PET CT scan.  We will try to arrange for this within a week and follow-up after that.     Orders Placed This Encounter  Procedures  . NM PET Image Initial (PI) Skull Base To Thigh    Standing Status:   Future    Standing Expiration Date:   12/13/2019    Order Specific Question:   ** REASON FOR EXAM (FREE TEXT)    Answer:   Met breast cancer restaging    Order  Specific Question:   If indicated for the ordered procedure, I authorize the administration of a radiopharmaceutical per Radiology protocol    Answer:   Yes    Order Specific Question:   Preferred imaging location?    Answer:   [facility]    Order Specific Question:   Radiology Contrast Protocol - do NOT remove file path    Answer:   _1   [email or web address]  . CBC with Differential (Cancer Center Only)    Standing Status:   Future    Standing Expiration Date:   12/14/2019  . CMP ([place] only)    Standing Status:   Future    Standing Expiration Date:   12/14/2019   The patient has a good understanding of the overall plan. she agrees with it. she will call with any problems that may develop before the next visit here.  Nicholas Lose, MD 12/13/2018  Julious Oka Dorshimer am acting as scribe for Dr. Nicholas Lose.  I have reviewed the above documentation for accuracy and completeness, and I agree with the above.

## 2018-12-12 NOTE — Telephone Encounter (Signed)
Per May patient need to come in this week for appointments that were missed. Patient had called about scheduling

## 2018-12-13 ENCOUNTER — Inpatient Hospital Stay: Payer: Medicare HMO | Attending: Oncology

## 2018-12-13 ENCOUNTER — Encounter (HOSPITAL_COMMUNITY): Payer: Self-pay

## 2018-12-13 ENCOUNTER — Ambulatory Visit (HOSPITAL_BASED_OUTPATIENT_CLINIC_OR_DEPARTMENT_OTHER)
Admission: RE | Admit: 2018-12-13 | Discharge: 2018-12-13 | Disposition: A | Discharge status: Home / Self Care | Payer: Medicare HMO | Source: Ambulatory Visit | Attending: Cardiovascular Disease | Admitting: Cardiovascular Disease

## 2018-12-13 ENCOUNTER — Other Ambulatory Visit: Payer: Self-pay

## 2018-12-13 ENCOUNTER — Telehealth: Payer: Self-pay | Admitting: Nurse Practitioner

## 2018-12-13 ENCOUNTER — Telehealth: Payer: Self-pay | Admitting: Hematology and Oncology

## 2018-12-13 ENCOUNTER — Inpatient Hospital Stay: Payer: Medicare HMO

## 2018-12-13 ENCOUNTER — Encounter (HOSPITAL_COMMUNITY): Payer: Self-pay | Admitting: Radiology

## 2018-12-13 ENCOUNTER — Emergency Department (HOSPITAL_COMMUNITY)
Admission: EM | Admit: 2018-12-13 | Discharge: 2018-12-13 | Disposition: A | Discharge status: Home / Self Care | Payer: Medicare HMO | Attending: Emergency Medicine | Admitting: Emergency Medicine

## 2018-12-13 ENCOUNTER — Inpatient Hospital Stay (HOSPITAL_BASED_OUTPATIENT_CLINIC_OR_DEPARTMENT_OTHER): Payer: Medicare HMO | Admitting: Hematology and Oncology

## 2018-12-13 DIAGNOSIS — Z170 Estrogen receptor positive status [ER+]: Secondary | ICD-10-CM

## 2018-12-13 DIAGNOSIS — Z79811 Long term (current) use of aromatase inhibitors: Secondary | ICD-10-CM

## 2018-12-13 DIAGNOSIS — Z923 Personal history of irradiation: Secondary | ICD-10-CM | POA: Diagnosis not present

## 2018-12-13 DIAGNOSIS — R2231 Localized swelling, mass and lump, right upper limb: Secondary | ICD-10-CM | POA: Diagnosis present

## 2018-12-13 DIAGNOSIS — C50911 Malignant neoplasm of unspecified site of right female breast: Secondary | ICD-10-CM

## 2018-12-13 DIAGNOSIS — J45909 Unspecified asthma, uncomplicated: Secondary | ICD-10-CM | POA: Diagnosis not present

## 2018-12-13 DIAGNOSIS — M7989 Other specified soft tissue disorders: Secondary | ICD-10-CM | POA: Insufficient documentation

## 2018-12-13 DIAGNOSIS — N179 Acute kidney failure, unspecified: Secondary | ICD-10-CM | POA: Diagnosis not present

## 2018-12-13 DIAGNOSIS — Z87891 Personal history of nicotine dependence: Secondary | ICD-10-CM | POA: Insufficient documentation

## 2018-12-13 DIAGNOSIS — N189 Chronic kidney disease, unspecified: Secondary | ICD-10-CM | POA: Diagnosis not present

## 2018-12-13 DIAGNOSIS — Z79899 Other long term (current) drug therapy: Secondary | ICD-10-CM | POA: Diagnosis not present

## 2018-12-13 DIAGNOSIS — C7951 Secondary malignant neoplasm of bone: Secondary | ICD-10-CM | POA: Insufficient documentation

## 2018-12-13 DIAGNOSIS — Z7901 Long term (current) use of anticoagulants: Secondary | ICD-10-CM | POA: Diagnosis not present

## 2018-12-13 DIAGNOSIS — C7800 Secondary malignant neoplasm of unspecified lung: Secondary | ICD-10-CM | POA: Insufficient documentation

## 2018-12-13 DIAGNOSIS — Z9221 Personal history of antineoplastic chemotherapy: Secondary | ICD-10-CM | POA: Diagnosis not present

## 2018-12-13 DIAGNOSIS — C787 Secondary malignant neoplasm of liver and intrahepatic bile duct: Secondary | ICD-10-CM | POA: Insufficient documentation

## 2018-12-13 DIAGNOSIS — Z96643 Presence of artificial hip joint, bilateral: Secondary | ICD-10-CM | POA: Insufficient documentation

## 2018-12-13 DIAGNOSIS — F419 Anxiety disorder, unspecified: Secondary | ICD-10-CM | POA: Insufficient documentation

## 2018-12-13 DIAGNOSIS — Z79818 Long term (current) use of other agents affecting estrogen receptors and estrogen levels: Secondary | ICD-10-CM | POA: Insufficient documentation

## 2018-12-13 DIAGNOSIS — Z8673 Personal history of transient ischemic attack (TIA), and cerebral infarction without residual deficits: Secondary | ICD-10-CM | POA: Insufficient documentation

## 2018-12-13 DIAGNOSIS — I82621 Acute embolism and thrombosis of deep veins of right upper extremity: Secondary | ICD-10-CM | POA: Insufficient documentation

## 2018-12-13 DIAGNOSIS — I10 Essential (primary) hypertension: Secondary | ICD-10-CM | POA: Diagnosis not present

## 2018-12-13 DIAGNOSIS — Z95828 Presence of other vascular implants and grafts: Secondary | ICD-10-CM

## 2018-12-13 DIAGNOSIS — I82A11 Acute embolism and thrombosis of right axillary vein: Secondary | ICD-10-CM | POA: Insufficient documentation

## 2018-12-13 DIAGNOSIS — Z853 Personal history of malignant neoplasm of breast: Secondary | ICD-10-CM | POA: Diagnosis not present

## 2018-12-13 DIAGNOSIS — Z86718 Personal history of other venous thrombosis and embolism: Secondary | ICD-10-CM

## 2018-12-13 LAB — CBC
HCT: 31.9 % — ABNORMAL LOW (ref 36.0–46.0)
Hemoglobin: 10.3 g/dL — ABNORMAL LOW (ref 12.0–15.0)
MCH: 38.1 pg — ABNORMAL HIGH (ref 26.0–34.0)
MCHC: 32.3 g/dL (ref 30.0–36.0)
MCV: 118.1 fL — ABNORMAL HIGH (ref 80.0–100.0)
Platelets: 150 10*3/uL (ref 150–400)
RBC: 2.7 MIL/uL — ABNORMAL LOW (ref 3.87–5.11)
RDW: 16.4 % — AB (ref 11.5–15.5)
WBC: 3.2 10*3/uL — ABNORMAL LOW (ref 4.0–10.5)
nRBC: 0 % (ref 0.0–0.2)

## 2018-12-13 LAB — BASIC METABOLIC PANEL
Anion gap: 11 (ref 5–15)
BUN: 24 mg/dL — ABNORMAL HIGH (ref 8–23)
CALCIUM: 9.8 mg/dL (ref 8.9–10.3)
CO2: 24 mmol/L (ref 22–32)
Chloride: 105 mmol/L (ref 98–111)
Creatinine, Ser: 2.15 mg/dL — ABNORMAL HIGH (ref 0.44–1.00)
GFR calc Af Amer: 25 mL/min — ABNORMAL LOW (ref >60)
GFR calc non Af Amer: 22 mL/min — ABNORMAL LOW (ref >60)
Glucose, Bld: 101 mg/dL — ABNORMAL HIGH (ref 70–99)
Potassium: 3.7 mmol/L (ref 3.5–5.1)
Sodium: 140 mmol/L (ref 135–145)

## 2018-12-13 MED ORDER — APIXABAN 5 MG PO TABS: 5.0000 mg | ORAL_TABLET | Freq: Two times a day (BID) | ORAL | 0 refills | Status: DC

## 2018-12-13 MED ORDER — FULVESTRANT 250 MG/5ML IM SOLN: 500.0000 mg | Freq: Once | INTRAMUSCULAR | Status: DC

## 2018-12-13 MED ORDER — SODIUM CHLORIDE 0.9% FLUSH
10.0000 mL | Freq: Once | INTRAVENOUS | Status: AC | PRN
  Administered 2018-12-13: 10 mL
  Filled 2018-12-13: qty 10

## 2018-12-13 MED ORDER — APIXABAN 5 MG PO TABS: 5.0000 mg | ORAL_TABLET | Freq: Once | ORAL | Status: DC

## 2018-12-13 MED ORDER — APIXABAN 5 MG PO TABS: 5.0000 mg | ORAL_TABLET | Freq: Two times a day (BID) | ORAL | 3 refills | Status: DC

## 2018-12-13 MED ORDER — HEPARIN SOD (PORK) LOCK FLUSH 100 UNIT/ML IV SOLN
500.0000 [IU] | Freq: Once | INTRAVENOUS | Status: AC | PRN
  Administered 2018-12-13: 500 [IU]
  Filled 2018-12-13: qty 5

## 2018-12-13 MED ORDER — FULVESTRANT 250 MG/5ML IM SOLN
500.0000 mg | Freq: Once | INTRAMUSCULAR | Status: AC
  Administered 2018-12-13: 500 mg via INTRAMUSCULAR

## 2018-12-13 MED ORDER — ZOLEDRONIC ACID 4 MG/5ML IV CONC
3.0000 mg | Freq: Once | INTRAVENOUS | Status: AC
  Administered 2018-12-13: 3 mg via INTRAVENOUS
  Filled 2018-12-13: qty 3.75

## 2018-12-13 MED ORDER — FULVESTRANT 250 MG/5ML IM SOLN
INTRAMUSCULAR | Status: AC
  Filled 2018-12-13: qty 5

## 2018-12-13 MED ORDER — APIXABAN 5 MG PO TABS: ORAL_TABLET | ORAL | 0 refills | Status: DC

## 2018-12-13 MED ORDER — SODIUM CHLORIDE 0.9 % IV SOLN
Freq: Once | INTRAVENOUS | Status: AC
  Administered 2018-12-13: 15:00:00 via INTRAVENOUS
  Filled 2018-12-13: qty 250

## 2018-12-13 MED ORDER — APIXABAN 5 MG PO TABS
10.0000 mg | ORAL_TABLET | Freq: Once | ORAL | Status: AC
  Administered 2018-12-13: 10 mg via ORAL
  Filled 2018-12-13: qty 2

## 2018-12-13 MED ORDER — APIXABAN 5 MG PO TABS: 10.0000 mg | ORAL_TABLET | Freq: Two times a day (BID) | ORAL | 0 refills | Status: DC

## 2018-12-13 MED FILL — ELIQUIS 5 MG TABLET: 5 | 30 days supply | Qty: 74 | Fill #0

## 2018-12-13 NOTE — Telephone Encounter (Signed)
Scheduled per May for 42months infusion

## 2018-12-13 NOTE — Patient Instructions (Signed)

## 2018-12-13 NOTE — Assessment & Plan Note (Signed)
Metastatic breast cancer with bone metastases along with lung and liver metastases, progressed on Ibrance with anastrozole.  Radiology review: 05/23/2018 CT CAP:New liver metastases 3.1 cm,increase in lung nodules, increase in sclerotic lesion of T11 and T9and S1 vertebral body. Liver biopsy 06/19/2018: Metastatic breast cancer ER 100%, PR 0%, Ki-67 50%, HER-2 negative BRCA1 mutation: Negative  PDL 1: 0%and foundation 1:PI 3 kinase mutation was identified  Current treatment: Faslodex with Ibrance started8/29/2019  Patient's insurance rejected Xgeva. On Zometa every 3 months.    Right arm numbness and weakness: MRI 08/04/2018: Abnormal soft tissue within and lateral to C7 to T1 neuroforamina of uncertain etiology.  Bone metastases T2 and scans MRI cervical spine 08/05/2018: Abnormal soft tissue C7-T1 could be metastases or could be disc herniation or it could also be a nerve sheath tumor. Repeat imaging was recommended  Patient is making an appointment with orthopedics. She just returned from a trip to [place]

## 2018-12-13 NOTE — Patient Instructions (Signed)

## 2018-12-13 NOTE — Progress Notes (Unsigned)
Preliminary report for right upper extremity venous duplex: Chronic occlusive DVT in the right subclavian and axillary veins. Acute occlusive DVT in the right brachial veins. All other veins appear within normal limits.

## 2018-12-13 NOTE — ED Notes (Signed)
Dr Lindi Adie paged @[phone] 

## 2018-12-13 NOTE — ED Provider Notes (Signed)
[facility] DEPT Provider Note   CSN: [id number] Arrival date & time: 12/13/18  2119     History   Chief Complaint Chief Complaint  Patient presents with  . DVT    HPI [name] [name] is a 78 y.o. female.  HPI 78 year old female comes in a chief complaint of right arm swelling. Patient has history of breast cancer with mets to her hip.  She also has history of stroke and is currently getting chemotherapy.  Patient has been having swelling in her right upper extremity for the last month.  She had an outpatient ultrasound ordered which showed DVT -and she was advised to come to the ER.  Patient follows up with Dr. Lindi Adie, Oncology.  She denies any new shortness of breath or chest pain.  She has no previous history of DVTs.  Patient denies any facial swelling.  Past Medical History:  Diagnosis Date  . Anxiety    hx of  . Asthma   . Breast cancer ([place])    2002; metastatic in 2012, right breast, spread to left hip in 2012  . Cerebrovascular accident [facility])    R thalamic CVA 01/2010  . Chemotherapy induced neutropenia ([place]) 08/15/2017  . Dyslipidemia   . Encounter for therapeutic drug monitoring 04/14/2017  . Goals of care, counseling/discussion 04/19/2017  . Hyperlipemia   . Hypertension   . Low back pain   . Osteoarthritis   . TIA (transient ischemic attack)    x2 in 2011  . Vertigo 2014    Patient Active Problem List   Diagnosis Date Noted  . Hip pain 10/09/2018  . Hand weakness 08/21/2018  . Ingrowing toenail 04/19/2018  . Stress 12/20/2017  . Chemotherapy induced neutropenia ([place]) 08/15/2017  . Goals of care, counseling/discussion 04/19/2017  . Encounter for therapeutic drug monitoring 04/14/2017  . Arm pain, anterior, left 03/08/2017  . Dry skin dermatitis 11/29/2016  . Diarrhea 11/12/2016  . Left shoulder pain 08/26/2016  . Port catheter in place 07/20/2016  . Rash and nonspecific skin eruption 04/13/2016  . Leg swelling  03/02/2016  . Obese 10/29/2015  . S/P right THA, AA 10/28/2015  . Full code status 08/06/2015  . Knee pain, right 07/28/2015  . Osteoarthritis of right lower extremity 09/17/2014  . Cyst of lateral meniscus 08/27/2014  . Cyst of lateral meniscus of right knee 08/27/2014  . Benign paroxysmal positional vertigo 07/12/2014  . Hypokalemia 07/06/2013  . Knee pain, right anterior 04/04/2013  . Creatinine elevation 04/04/2013  . Dyspnea 05/12/2012  . GERD (gastroesophageal reflux disease) 05/12/2012  . Secondary diabetes mellitus with renal manifestations ([place]) 02/11/2012  . Breast cancer metastasized to bone, right ([place]) 04/23/2011  . Edema 04/23/2011  . Situational anxiety 11/03/2010  . Osteoarthritis 10/21/2010  . Low back pain 10/21/2010  . HIP PAIN 10/07/2010  . NAUSEA AND VOMITING 08/26/2010  . HYPERLIPIDEMIA 02/18/2010  . CEREBROVASCULAR ACCIDENT, HX OF 02/18/2010  . WEIGHT GAIN, ABNORMAL 12/19/2009  . LEG PAIN, RIGHT 04/16/2009  . Vitamin D deficiency 03/07/2009  . Hidradenitis 04/12/2008  . FATIGUE 10/03/2007  . 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 Essential hypertension 10/02/2007  . Asthma 10/02/2007    Past Surgical History:  Procedure Laterality Date  . ABDOMINAL HYSTERECTOMY     complete  . BREAST LUMPECTOMY     Right breast 2002  . JOINT REPLACEMENT  2012   L hip due to breast ca met  . TOTAL HIP ARTHROPLASTY Right 10/28/2015   Procedure: RIGHT TOTAL HIP ARTHROPLASTY  ANTERIOR APPROACH;  Surgeon: Paralee Cancel, MD;  Location: [facility] ORS;  Service: Orthopedics;  Laterality: Right;     OB History   No obstetric history on file.      Home Medications    Prior to Admission medications   Medication Sig Start Date End Date Taking? Authorizing Provider  acetaminophen (TYLENOL) 500 MG tablet Take 500 mg by mouth daily as needed (arm pain).   Yes [provider]  ALPRAZolam (XANAX) 0.5 MG tablet Take 1 tablet (0.5 mg total) by mouth 2 (two) times daily as needed for  anxiety or sleep. Patient taking differently: Take 0.5 mg by mouth daily as needed for anxiety or sleep.  08/21/18  Yes [name], [name], MD  amLODipine (NORVASC) 10 MG tablet Take 1 tablet (10 mg total) by mouth daily. 08/21/18  Yes [name], [name], MD  anastrozole (ARIMIDEX) 1 MG tablet TAKE 1 TABLET BY MOUTH ONCE DAILY 04/03/18  Yes Curt Bears, MD  Cholecalciferol (VITAMIN D3) 1000 UNITS CAPS Take 1 capsule by mouth daily.    Yes [provider]  gabapentin (NEURONTIN) 100 MG capsule Take 1 capsule (100 mg total) by mouth at bedtime. 12/08/18  Yes Lance Sell, NP  hydrochlorothiazide (HYDRODIURIL) 25 MG tablet Take 25 mg by mouth daily. 08/26/18  Yes [provider]  losartan (COZAAR) 100 MG tablet Take 100 mg by mouth daily. 08/26/18  Yes [provider]  nystatin (MYCOSTATIN/NYSTOP) powder Apply 1 Dose topically daily. 04/06/18  Yes [provider]  palbociclib (IBRANCE) 100 MG capsule Take 1 capsule (100 mg total) by mouth daily with breakfast. Take whole with food. 09/04/18  Yes Nicholas Lose, MD  apixaban (ELIQUIS) 5 MG TABS tablet Take 10 mg bid for 7 days orally and then decrease to 5 mg bid orally thereafter. 12/13/18   Varney Biles, MD  meclizine (ANTIVERT) 12.5 MG tablet Take 1 tablet (12.5 mg total) by mouth 2 (two) times daily as needed for dizziness. Patient not taking: Reported on 12/13/2018 06/29/14   Jamse Mead, PA-C    Family History Family History  Problem Relation Age of Onset  . Hypertension Mother   . Hypertension Father   . Cancer Neg Hx     Social History Social History   Tobacco Use  . Smoking status: Former Smoker    Packs/day: 1.00    Years: 40.00    Pack years: 40.00    Types: Cigarettes    Last attempt to quit: 02/12/2011    Years since quitting: 7.8  . Smokeless tobacco: Never Used  Substance Use Topics  . Alcohol use: Yes    Comment: sometimes daily, some occasional  . Drug use: No      Allergies   Patient has no known allergies.   Review of Systems Review of Systems  Constitutional: Positive for activity change.  Respiratory: Positive for shortness of breath.   Cardiovascular: Negative for chest pain.  Gastrointestinal: Negative for nausea and vomiting.  Allergic/Immunologic: Positive for immunocompromised state.  Hematological: Does not bruise/bleed easily.  All other systems reviewed and are negative.    Physical Exam Updated Vital Signs BP 132/63   Pulse 74   Temp 97.6 F (36.4 C) (Oral)   Resp 13   Ht _0  (1.6 m)   Wt 88.5 kg   SpO2 99%   BMI 34.54 kg/m   Physical Exam Vitals signs and nursing note reviewed.  Constitutional:      Appearance: She is well-developed.  HENT:  Head: Normocephalic and atraumatic.  Neck:     Musculoskeletal: Normal range of motion and neck supple.  Cardiovascular:     Rate and Rhythm: Normal rate.  Pulmonary:     Effort: Pulmonary effort is normal.  Abdominal:     General: Bowel sounds are normal.  Musculoskeletal:        General: Swelling and tenderness present.     Right lower leg: Edema present.     Comments: Patient's right lower extremity is edematous and cool to touch. She has 1+ radial pulse distally.  Skin:    General: Skin is warm and dry.  Neurological:     Mental Status: She is alert and oriented to person, place, and time.      ED Treatments / Results  Labs (all labs ordered are listed, but only abnormal results are displayed) Labs Reviewed  CBC - Abnormal; Notable for the following components:      Result Value   WBC 3.2 (*)    RBC 2.70 (*)    Hemoglobin 10.3 (*)    HCT 31.9 (*)    MCV 118.1 (*)    MCH 38.1 (*)    RDW 16.4 (*)    All other components within normal limits  BASIC METABOLIC PANEL - Abnormal; Notable for the following components:   Glucose, Bld 101 (*)    BUN 24 (*)    Creatinine, Ser 2.15 (*)    GFR calc non Af Amer 22 (*)    GFR calc Af Amer 25 (*)     All other components within normal limits    EKG None  Radiology Vas [place] Upper Extremity Venous Duplex  Result Date: 12/13/2018 UPPER VENOUS STUDY  Other Indications: Patient presents today with complaints of numbness in the right upper extremity with numbness and sharp, shooting pain in the right 5th finger for about 2 months. She has been having swelling and pain in the right upper extremity x 2 weeks. She c/o of SOB with activity for about 2 months. Venous duplex of the left upper extremity on 03/10/2017 showed no evidence of thrombus in the deep or superificial veins.  Risk Factors: Cancer history in the right breast in 2002, metastatic in 2012, right breast with spread to left hip in 2012. Performing Technologist: Sharlett Iles RVT  Examination Guidelines: A complete evaluation includes B-mode imaging, spectral Doppler, color Doppler, and power Doppler as needed of all accessible portions of each vessel. Bilateral testing is considered an integral part of a complete examination. Limited examinations for reoccurring indications may be performed as noted.  Right Findings: +----------+------------+------------------+---------+-----------+-------+ RIGHT     Compressible    Properties    PhasicitySpontaneousSummary +----------+------------+------------------+---------+-----------+-------+ IJV           Full                         Yes       Yes            +----------+------------+------------------+---------+-----------+-------+ Subclavian    None     with striations     No        No             +----------+------------+------------------+---------+-----------+-------+ Axillary      None    brightly echogenic   No        No             +----------+------------+------------------+---------+-----------+-------+ Brachial      None  softly echogenic    No        No             +----------+------------+------------------+---------+-----------+-------+ Radial        Full                          Yes       Yes            +----------+------------+------------------+---------+-----------+-------+ Ulnar         Full                         Yes       No             +----------+------------+------------------+---------+-----------+-------+ Cephalic      Full                         Yes       Yes            +----------+------------+------------------+---------+-----------+-------+ Basilic       Full                         Yes       Yes            +----------+------------+------------------+---------+-----------+-------+ Evidence of chronic, occlusive thrombus in the right subclavian and axillary vein. Acute, occlusive thrombus noted in the mid and distal brachial veins. Dilatation noted in the brachial veins, mid .45 cm AP x .45 cm TRV and distal .47 cm AP x .47 cm TRV.  Left Findings: +----------+------------+----------+---------+-----------+-------+ LEFT      CompressiblePropertiesPhasicitySpontaneousSummary +----------+------------+----------+---------+-----------+-------+ Subclavian    Full                 Yes       Yes            +----------+------------+----------+---------+-----------+-------+  Spoke with Arbie Cookey and Marzetta Board regarding preliminary. Per patient, Marzetta Board advised her to go to the nearest ED. Summary:  Right: Findings consistent with acute deep vein thrombosis involving the right brachial veins. Findings consistent with chronic deep vein thrombosis involving the right subclavian vein and right axillary vein.  Left: No evidence of thrombosis in the subclavian.  *See table(s) above for measurements and observations.  Diagnosing physician: Larae Grooms MD    Preliminary     Procedures Procedures (including critical care time)  Medications Ordered in ED Medications  apixaban (ELIQUIS) tablet 10 mg (10 mg Oral Given 12/13/18 1303)     Initial Impression / Assessment and Plan / ED Course  I have reviewed the triage vital signs  and the nursing notes.  Pertinent labs & imaging results that were available during my care of the patient were reviewed by me and considered in my medical decision making (see chart for details).     78 year old female with active breast cancer with mets comes in with chief complaint of upper extremity swelling, which was found to be because of DVT.  On our exam patient has cool right upper extremity, however she does have 1+ radial pulse.  Ultrasound DVT results reviewed.  Clinical concerns are low for superior vena cava syndrome.  There is no facial swelling.  Also we do not think she has underlying PE, if she does have PE the treatment is not going to change.  Patient's lab reveals that she has slight worsening of her renal  function.  Her GFR is about 24.  Her baseline creatinine is 1.6.  We consulted with pharmacy, and based on their recommendations starting patient on Eliquis 10 mg twice a day.  I discussed the case with the oncologist, Dr. Loleta Dicker will be seeing patient in the clinic this afternoon.  I have asked him to follow-up on the creatinine -as Eliquis does require a renal sensitive dose if creatinine is getting worse.  He was also made aware of the diagnosis and I discussed with him that my clinical concerns for superior vena cava syndrome at this time is low.  He will see the patient in the clinic today and appreciate their help.  Final Clinical Impressions(s) / ED Diagnoses   Final diagnoses:  Acute deep vein thrombosis (DVT) of brachial vein of right upper extremity ([place])  AKI (acute kidney injury) [facility])    ED Discharge Orders         Ordered    apixaban (ELIQUIS) 5 MG TABS tablet  2 times daily,   Status:  Discontinued     12/13/18 1230    apixaban (ELIQUIS) 5 MG TABS tablet  2 times daily,   Status:  Discontinued     12/13/18 1232    apixaban (ELIQUIS) 5 MG TABS tablet     12/13/18 1312           Varney Biles, MD 12/13/18 1414

## 2018-12-13 NOTE — Telephone Encounter (Signed)
Rec'd call report from [facility] [place] [facility].  Reported Venous Doppler of right arm showed Acute Thrombus in brachial vein, and Chronic Thrombus in axillary and subclavian vein.  Called [facility] with above results.  Spoke with Erline Levine, and transferred call with Ultrasound Technician, Varney Biles, through to office, for further recommendation by provider.

## 2018-12-13 NOTE — Discharge Instructions (Addendum)
We saw in the ER after you are diagnosed with a blood clot in your arm. It appears that the blood clot is because of your underlying cancer.  The lab work-up here also shows that you have a slight worsening of your kidney function.  We have given you first dose of your blood thinner now. We recommend that you follow-up with your oncologist later today to see how they would like you to manage your blood clot. Additionally, please have your kidney function checked again in 3 to 5 days to ensure it is improving.  Hydrate yourself well.   Information on my medicine - ELIQUIS (apixaban)  Why was Eliquis prescribed for you? Eliquis was prescribed to treat a blood clot that was found in the veins of your arm (deep vein thrombosis) and to reduce the risk of them occurring again.  What do You need to know about Eliquis ? The starting dose is 10 mg (two 5 mg tablets) taken TWICE daily for the FIRST SEVEN (7) DAYS, then on 12/20/2018, the dose is reduced to ONE 5 mg tablet taken TWICE daily.  Eliquis may be taken with or without food.   Try to take the dose about the same time in the morning and in the evening. If you have difficulty swallowing the tablet whole please discuss with your pharmacist how to take the medication safely.  Take Eliquis exactly as prescribed and DO NOT stop taking Eliquis without talking to the doctor who prescribed the medication.  Stopping may increase your risk of developing a new blood clot.  Refill your prescription before you run out.  After discharge, you should have regular check-up appointments with your healthcare provider that is prescribing your Eliquis.    What do you do if you miss a dose? If a dose of ELIQUIS is not taken at the scheduled time, take it as soon as possible on the same day and twice-daily administration should be resumed. The dose should not be doubled to make up for a missed dose.  Important Safety Information A possible side effect of  Eliquis is bleeding. You should call your healthcare provider right away if you experience any of the following: ? Bleeding from an injury or your nose that does not stop. ? Unusual colored urine (red or dark brown) or unusual colored stools (red or black). ? Unusual bruising for unknown reasons. ? A serious fall or if you hit your head (even if there is no bleeding).  Some medicines may interact with Eliquis and might increase your risk of bleeding or clotting while on Eliquis. To help avoid this, consult your healthcare provider or pharmacist prior to using any new prescription or non-prescription medications, including herbals, vitamins, non-steroidal anti-inflammatory drugs (NSAIDs) and supplements.  This website has more information on Eliquis (apixaban): http://www.eliquis.com/eliquis/home

## 2018-12-13 NOTE — ED Notes (Signed)
Save tube sent to lab for add on.

## 2018-12-13 NOTE — ED Notes (Signed)
Waiting for pharmacy to bring up medication (Eliquis) before discharging patient. Per MD patient may eat. Patient given meal tray.

## 2018-12-13 NOTE — Telephone Encounter (Addendum)
Per Caesar Chestnut, NP- Pt informed to go to ED now. Patient verbalized understanding.

## 2018-12-13 NOTE — ED Triage Notes (Signed)
Patient c/o right arm pain x >a month. Patient had an [place] to the right arm and was confirmed that she had a DVT.

## 2018-12-19 NOTE — Progress Notes (Signed)
Patient Care Team: [name], [name], MD as PCP - General (Internal Medicine) Curt Bears, MD (Hematology and Oncology) Paralee Cancel, MD (Orthopedic Surgery) Tyler Pita, MD (Radiation Oncology) Nicholas Lose, MD as Consulting Physician (Hematology and Oncology) Pleasant, Eppie Gibson, RN as [place] [facility]  DIAGNOSIS:    ICD-10-CM   1. Breast cancer metastasized to bone, right (HCC) C50.911    C79.51   2. Acute deep vein thrombosis (DVT) of axillary vein of right upper extremity (HCC) I82.A11     SUMMARY OF ONCOLOGIC HISTORY:   Breast cancer metastasized to bone, right ([place])   05/22/2001 Initial Diagnosis    Rt breast: stage IIb (T2, N1, M0) invasive right breast carcinoma with positive estrogen and progesterone receptor as well as HER-2/neu    06/12/2001 Surgery    right lumpectomy with right axillary lymph node dissection revealing 4/9 lymph nodes that were positive for malignancy.     08/12/2001 - 01/10/2002 Chemotherapy    Adj chemo CMF X 8     05/07/2002 - 12/14/2007 Anti-estrogen oral therapy    Adj Tamoxifen for 2 years and then switched to Letrozole (by Dr.Khan in Aug 2005), stopped Feb 2009    02/11/2003 - 04/05/2003 Radiation Therapy    Adj XRT by Tammi Klippel    03/18/2011 -  Anti-estrogen oral therapy    Anastrozole 1 mg daily    04/01/2011 Surgery    Left total hip replacement secondary to pathologic left femoral neck fracture    04/25/2017 Relapse/Recurrence    Anastrozole with Ibrance discontinued 05/30/2018 due to disease progression, Xgeva for bone metastases every 3 months    06/19/2018 Procedure    Liver biopsy: Metastatic breast cancer, ER 100%, PR 0%, Ki-67 50%, HER-2 negative    07/06/2018 Miscellaneous    Foundation 1 results: Microsatellite stable, tumor mutational burden 15 mutations per MB, ATRX, CCND1, CDK4 amplification, ERBB4 amplification, ESR1 amplification, FGFR 1, MDM2 amplification, MEM1, NST3 amplification, PIK3CA  mutation, ZNF703 amplification; PDL 1: 0%    07/06/2018 -  Anti-estrogen oral therapy    Ibrance with Faslodex     CHIEF COMPLIANT: Follow-up on Faslodex and Ibrance with Zometa  INTERVAL HISTORY: [name] [name] is a 78 y.o. with above-mentioned history of metastatic breast cancer currently on Faslodex with Ibrance. She presents to the clinic alone today and reports pain her right arm is still present after her recent DVT but is slowly improving. She reports she is looking into assisted living facilities because she feels helpless without the ability to use her right arm. She has not taken her blood pressure medication for three days, but her bp was 123/58 today. She asked if she could eat green, leafy vegetables while on Eliquis. Her labs from today show: WBC 4.7, Hg 9.8, creatinine 1.90.   REVIEW OF SYSTEMS:   Constitutional: Denies fevers, chills or abnormal weight loss Eyes: Denies blurriness of vision Ears, nose, mouth, throat, and face: Denies mucositis or sore throat Respiratory: Denies cough, dyspnea or wheezes Cardiovascular: Denies palpitation, chest discomfort Gastrointestinal: Denies nausea, heartburn or change in bowel habits Skin: Denies abnormal skin rashes MSK: (+) right arm pain Lymphatics: Denies new lymphadenopathy or easy bruising Neurological: Denies numbness, tingling or new weaknesses Behavioral/Psych: Mood is stable, no new changes  Extremities: No lower extremity edema Breast: denies any pain or lumps or nodules in either breasts All other systems were reviewed with the patient and are negative.  I have reviewed the past medical history, past surgical history, social  history and family history with the patient and they are unchanged from previous note.  ALLERGIES:  has No Known Allergies.  MEDICATIONS:  Current Outpatient Medications  Medication Sig Dispense Refill   acetaminophen (TYLENOL) 500 MG tablet Take 500 mg by mouth daily as needed (arm pain).       ALPRAZolam (XANAX) 0.5 MG tablet Take 1 tablet (0.5 mg total) by mouth 2 (two) times daily as needed for anxiety or sleep. (Patient taking differently: Take 0.5 mg by mouth daily as needed for anxiety or sleep. ) 60 tablet 3   amLODipine (NORVASC) 10 MG tablet Take 1 tablet (10 mg total) by mouth daily. 30 tablet 11   anastrozole (ARIMIDEX) 1 MG tablet TAKE 1 TABLET BY MOUTH ONCE DAILY 90 tablet 2   apixaban (ELIQUIS) 5 MG TABS tablet Take 10 mg bid for 7 days orally and then decrease to 5 mg bid orally thereafter. 74 tablet 0   apixaban (ELIQUIS) 5 MG TABS tablet Take 1 tablet (5 mg total) by mouth 2 (two) times daily. 10 mg twice daily for 7 days and then 5 mg twice daily after that 90 tablet 3   Cholecalciferol (VITAMIN D3) 1000 UNITS CAPS Take 1 capsule by mouth daily.      gabapentin (NEURONTIN) 100 MG capsule Take 1 capsule (100 mg total) by mouth at bedtime. 30 capsule 1   hydrochlorothiazide (HYDRODIURIL) 25 MG tablet Take 25 mg by mouth daily.  3   losartan (COZAAR) 100 MG tablet Take 100 mg by mouth daily.  3   meclizine (ANTIVERT) 12.5 MG tablet Take 1 tablet (12.5 mg total) by mouth 2 (two) times daily as needed for dizziness. (Patient not taking: Reported on 12/13/2018) 10 tablet 0   nystatin (MYCOSTATIN/NYSTOP) powder Apply 1 Dose topically daily.  3   palbociclib (IBRANCE) 100 MG capsule Take 1 capsule (100 mg total) by mouth daily with breakfast. Take whole with food. 21 capsule 6   No current facility-administered medications for this visit.    Facility-Administered Medications Ordered in Other Visits  Medication Dose Route Frequency Provider Last Rate Last Dose   denosumab (XGEVA) injection 120 mg  120 mg Subcutaneous Once Curt Bears, MD        PHYSICAL EXAMINATION: ECOG PERFORMANCE STATUS: 1 - Symptomatic but completely ambulatory  Vitals:   12/20/18 1030  BP: (!) 123/58  Pulse: 94  Resp: 16  Temp: 98.3 F (36.8 C)  SpO2: 100%   Filed Weights    12/20/18 1030  Weight: 199 lb 12.8 oz (90.6 kg)    GENERAL: alert, no distress and comfortable SKIN: skin color, texture, turgor are normal, no rashes or significant lesions EYES: normal, Conjunctiva are pink and non-injected, sclera clear OROPHARYNX: no exudate, no erythema and lips, buccal mucosa, and tongue normal  NECK: supple, thyroid normal size, non-tender, without nodularity LYMPH: no palpable lymphadenopathy in the cervical, axillary or inguinal LUNGS: clear to auscultation and percussion with normal breathing effort HEART: regular rate & rhythm and no murmurs and no lower extremity edema ABDOMEN: abdomen soft, non-tender and normal bowel sounds MUSCULOSKELETAL: no cyanosis of digits and no clubbing  NEURO: alert & oriented x 3 with fluent speech, no focal motor/sensory deficits EXTREMITIES: No lower extremity edema  LABORATORY DATA:  I have reviewed the data as listed CMP Latest Ref Rng & Units 12/20/2018 12/13/2018 09/04/2018  Glucose 70 - 99 mg/dL 98 101(H) 96  BUN 8 - 23 mg/dL 17 24(H) 26(H)  Creatinine 0.44 - 1.00  mg/dL 1.90(H) 2.15(H) 1.55(H)  Sodium 135 - 145 mmol/L 140 140 142  Potassium 3.5 - 5.1 mmol/L 3.9 3.7 4.9  Chloride 98 - 111 mmol/L 106 105 106  CO2 22 - 32 mmol/L _0 Calcium 8.9 - 10.3 mg/dL 8.9 9.8 9.5  Total Protein 6.5 - 8.1 g/dL 7.8 - 7.2  Total Bilirubin 0.3 - 1.2 mg/dL 0.3 - 0.4  Alkaline Phos 38 - 126 U/L 73 - 56  AST 15 - 41 U/L 19 - 13(L)  ALT 0 - 44 U/L 7 - 10    Lab Results  Component Value Date   WBC 4.7 12/20/2018   HGB 9.8 (L) 12/20/2018   HCT 29.9 (L) 12/20/2018   MCV 112.8 (H) 12/20/2018   PLT 226 12/20/2018   NEUTROABS 2.8 12/20/2018    ASSESSMENT & PLAN:  Breast cancer metastasized to bone, right (HCC) Metastatic breast cancer with bone metastases along with lung and liver metastases, progressed on Ibrance with anastrozole.  Radiology review: 05/23/2018 CT CAP:New liver metastases 3.1 cm,increase in lung nodules,  increase in sclerotic lesion of T11 and T9and S1 vertebral body. Liver biopsy 06/19/2018: Metastatic breast cancer ER 100%, PR 0%, Ki-67 50%, HER-2 negative BRCA1 mutation: Negative  PDL 1: 0%and foundation 1:PI 3 kinase mutation was identified ----------------------------------------------------------------------------------------------------------------------------------------------------------------------- Current treatment: Faslodex with Ibrance started8/29/2019  Patient's insurance rejected Xgeva.OnZometa every 3 months.    Right arm numbness and weakness:MRI 08/04/2018: Abnormal soft tissue within and lateral to C7 to T1 neuroforamina of uncertain etiology.Bone metastases T2 and scans. MRI cervical spine 08/05/2018: Abnormal soft tissue C7-T1 could be metastases or could be disc herniation or it could also be a nerve sheath tumor.  PET CT scan has been ordered.  DVT (deep venous thrombosis) ([place]) 12/14/2019 DVT right upper extremity:  Evidence of chronic, occlusive thrombus in the right subclavian and axillary vein. Acute, occlusive thrombus noted in the mid and distal brachial veins.  She went to the emergency room and was started on Eliquis 10 mg twice daily for 7 days followed by 5 mg twice daily. Monitoring her renal function Today's creatinine is 19 and it is stable.  She will continue the same dosage.   No orders of the defined types were placed in this encounter.  The patient has a good understanding of the overall plan. she agrees with it. she will call with any problems that may develop before the next visit here.  Nicholas Lose, MD 12/20/2018  Julious Oka Dorshimer am acting as scribe for Dr. Nicholas Lose.  I have reviewed the above documentation for accuracy and completeness, and I agree with the above.

## 2018-12-20 ENCOUNTER — Inpatient Hospital Stay: Payer: Medicare HMO

## 2018-12-20 ENCOUNTER — Inpatient Hospital Stay: Payer: Medicare HMO | Admitting: Hematology and Oncology

## 2018-12-20 DIAGNOSIS — C7951 Secondary malignant neoplasm of bone: Secondary | ICD-10-CM | POA: Diagnosis not present

## 2018-12-20 DIAGNOSIS — Z9221 Personal history of antineoplastic chemotherapy: Secondary | ICD-10-CM

## 2018-12-20 DIAGNOSIS — Z923 Personal history of irradiation: Secondary | ICD-10-CM

## 2018-12-20 DIAGNOSIS — I82A11 Acute embolism and thrombosis of right axillary vein: Secondary | ICD-10-CM | POA: Diagnosis not present

## 2018-12-20 DIAGNOSIS — Z79899 Other long term (current) drug therapy: Secondary | ICD-10-CM

## 2018-12-20 DIAGNOSIS — C50911 Malignant neoplasm of unspecified site of right female breast: Secondary | ICD-10-CM

## 2018-12-20 DIAGNOSIS — Z79811 Long term (current) use of aromatase inhibitors: Secondary | ICD-10-CM

## 2018-12-20 DIAGNOSIS — Z170 Estrogen receptor positive status [ER+]: Secondary | ICD-10-CM

## 2018-12-20 DIAGNOSIS — Z79818 Long term (current) use of other agents affecting estrogen receptors and estrogen levels: Secondary | ICD-10-CM

## 2018-12-20 DIAGNOSIS — I82409 Acute embolism and thrombosis of unspecified deep veins of unspecified lower extremity: Secondary | ICD-10-CM | POA: Insufficient documentation

## 2018-12-20 DIAGNOSIS — C7800 Secondary malignant neoplasm of unspecified lung: Secondary | ICD-10-CM

## 2018-12-20 DIAGNOSIS — Z7901 Long term (current) use of anticoagulants: Secondary | ICD-10-CM

## 2018-12-20 DIAGNOSIS — C787 Secondary malignant neoplasm of liver and intrahepatic bile duct: Secondary | ICD-10-CM | POA: Diagnosis not present

## 2018-12-20 DIAGNOSIS — N189 Chronic kidney disease, unspecified: Secondary | ICD-10-CM

## 2018-12-20 LAB — CBC WITH DIFFERENTIAL (CANCER CENTER ONLY)
ABS IMMATURE GRANULOCYTES: 0.03 10*3/uL (ref 0.00–0.07)
Basophils Absolute: 0.1 10*3/uL (ref 0.0–0.1)
Basophils Relative: 2 %
Eosinophils Absolute: 0 10*3/uL (ref 0.0–0.5)
Eosinophils Relative: 0 %
HCT: 29.9 % — ABNORMAL LOW (ref 36.0–46.0)
Hemoglobin: 9.8 g/dL — ABNORMAL LOW (ref 12.0–15.0)
IMMATURE GRANULOCYTES: 1 %
LYMPHS ABS: 0.9 10*3/uL (ref 0.7–4.0)
Lymphocytes Relative: 19 %
MCH: 37 pg — ABNORMAL HIGH (ref 26.0–34.0)
MCHC: 32.8 g/dL (ref 30.0–36.0)
MCV: 112.8 fL — ABNORMAL HIGH (ref 80.0–100.0)
Monocytes Absolute: 0.9 10*3/uL (ref 0.1–1.0)
Monocytes Relative: 19 %
Neutro Abs: 2.8 10*3/uL (ref 1.7–7.7)
Neutrophils Relative %: 59 %
Platelet Count: 226 10*3/uL (ref 150–400)
RBC: 2.65 MIL/uL — ABNORMAL LOW (ref 3.87–5.11)
RDW: 15.1 % (ref 11.5–15.5)
WBC Count: 4.7 10*3/uL (ref 4.0–10.5)
nRBC: 0 % (ref 0.0–0.2)

## 2018-12-20 LAB — CMP (CANCER CENTER ONLY)
ALK PHOS: 73 U/L (ref 38–126)
ALT: 7 U/L (ref 0–44)
AST: 19 U/L (ref 15–41)
Albumin: 3.6 g/dL (ref 3.5–5.0)
Anion gap: 10 (ref 5–15)
BUN: 17 mg/dL (ref 8–23)
CO2: 24 mmol/L (ref 22–32)
Calcium: 8.9 mg/dL (ref 8.9–10.3)
Chloride: 106 mmol/L (ref 98–111)
Creatinine: 1.9 mg/dL — ABNORMAL HIGH (ref 0.44–1.00)
GFR, Est AFR Am: 29 mL/min — ABNORMAL LOW (ref >60)
GFR, Estimated: 25 mL/min — ABNORMAL LOW (ref >60)
Glucose, Bld: 98 mg/dL (ref 70–99)
Potassium: 3.9 mmol/L (ref 3.5–5.1)
Sodium: 140 mmol/L (ref 135–145)
Total Bilirubin: 0.3 mg/dL (ref 0.3–1.2)
Total Protein: 7.8 g/dL (ref 6.5–8.1)

## 2018-12-20 NOTE — Assessment & Plan Note (Signed)
Metastatic breast cancer with bone metastases along with lung and liver metastases, progressed on Ibrance with anastrozole.  Radiology review: 05/23/2018 CT CAP:New liver metastases 3.1 cm,increase in lung nodules, increase in sclerotic lesion of T11 and T9and S1 vertebral body. Liver biopsy 06/19/2018: Metastatic breast cancer ER 100%, PR 0%, Ki-67 50%, HER-2 negative BRCA1 mutation: Negative  PDL 1: 0%and foundation 1:PI 3 kinase mutation was identified ----------------------------------------------------------------------------------------------------------------------------------------------------------------------- Current treatment: Faslodex with Ibrance started8/29/2019  Patient's insurance rejected Xgeva.OnZometa every 3 months.    Right arm numbness and weakness:MRI 08/04/2018: Abnormal soft tissue within and lateral to C7 to T1 neuroforamina of uncertain etiology.Bone metastases T2 and scans. MRI cervical spine 08/05/2018: Abnormal soft tissue C7-T1 could be metastases or could be disc herniation or it could also be a nerve sheath tumor.  PET CT scan has been ordered.

## 2018-12-20 NOTE — Assessment & Plan Note (Signed)
12/14/2019 DVT right upper extremity:  Evidence of chronic, occlusive thrombus in the right subclavian and axillary vein. Acute, occlusive thrombus noted in the mid and distal brachial veins.  She went to the emergency room and was started on Eliquis 10 mg twice daily for 7 days followed by 5 mg twice daily. Monitoring her renal function

## 2018-12-28 ENCOUNTER — Other Ambulatory Visit: Payer: Self-pay | Admitting: *Deleted

## 2018-12-28 NOTE — Patient Outreach (Signed)
[facility]) Care Management  12/28/2018  MARIPAT BORBA 1941/09/07 [id number]   RN Health Coach received  telephone call from [place] at neuro. RN explained to [place] that the patient wants the CT done now that she knows it will be covered. Per Caryl Pina the patient needs to call [facility] because she has declined the CT twice.  Caryl Pina stated the precertification  expires tomorrow. RN telephone call to patient and gave her the number to [facility] to reschedule. RN explained that the certification expires tomorrow.  [place] Care Management [phone]

## 2018-12-28 NOTE — Patient Outreach (Signed)
[facility]) Care Management  12/28/2018   RICCA MELGAREJO Feb 22, 1941 [id number]  RN Health Coach telephone call to patient.  [employer] compliance verified.  Per patient her last blood pressure was taken at the dr office. Patient blood pressure was 123/ 58. Per patient she had not taken her medications for a couple of days but is now taking as physician ordered. Per patient she has spoken with brother and he wants her to go into an assisted living facility. Patient has looked at a couple of places. Patient is unable to cook the meals due to the weakness in the rt arm. Per patient she is noted to have a blood clot in her arm and has started on blood thinners. Patient stated she is moving her fingers a little. She has very little movement in arm. Patient had not taken the CT of the soft tissue of the neck as Dr Janine Ores ordered. Patient was afraid that her benefits would not cover until she talked with them. Patient talked with benefits and wants to get rescheduled. RN called and left message for Caryl Pina at neuro office. Patient has agreed to further outreach calls   Current Medications:  Current Outpatient Medications  Medication Sig Dispense Refill  . acetaminophen (TYLENOL) 500 MG tablet Take 500 mg by mouth daily as needed (arm pain).    Marland Kitchen ALPRAZolam (XANAX) 0.5 MG tablet Take 1 tablet (0.5 mg total) by mouth 2 (two) times daily as needed for anxiety or sleep. (Patient taking differently: Take 0.5 mg by mouth daily as needed for anxiety or sleep. ) 60 tablet 3  . amLODipine (NORVASC) 10 MG tablet Take 1 tablet (10 mg total) by mouth daily. 30 tablet 11  . anastrozole (ARIMIDEX) 1 MG tablet TAKE 1 TABLET BY MOUTH ONCE DAILY 90 tablet 2  . apixaban (ELIQUIS) 5 MG TABS tablet Take 10 mg bid for 7 days orally and then decrease to 5 mg bid orally thereafter. 74 tablet 0  . apixaban (ELIQUIS) 5 MG TABS tablet Take 1 tablet (5 mg total) by mouth 2 (two) times daily. 10 mg twice daily for 7  days and then 5 mg twice daily after that 90 tablet 3  . Cholecalciferol (VITAMIN D3) 1000 UNITS CAPS Take 1 capsule by mouth daily.     Marland Kitchen gabapentin (NEURONTIN) 100 MG capsule Take 1 capsule (100 mg total) by mouth at bedtime. 30 capsule 1  . hydrochlorothiazide (HYDRODIURIL) 25 MG tablet Take 25 mg by mouth daily.  3  . losartan (COZAAR) 100 MG tablet Take 100 mg by mouth daily.  3  . meclizine (ANTIVERT) 12.5 MG tablet Take 1 tablet (12.5 mg total) by mouth 2 (two) times daily as needed for dizziness. (Patient not taking: Reported on 12/13/2018) 10 tablet 0  . nystatin (MYCOSTATIN/NYSTOP) powder Apply 1 Dose topically daily.  3  . palbociclib (IBRANCE) 100 MG capsule Take 1 capsule (100 mg total) by mouth daily with breakfast. Take whole with food. 21 capsule 6   No current facility-administered medications for this visit.    Facility-Administered Medications Ordered in Other Visits  Medication Dose Route Frequency Provider Last Rate Last Dose  . denosumab (XGEVA) injection 120 mg  120 mg Subcutaneous Once Curt Bears, MD        Functional Status:  In your present state of health, do you have any difficulty performing the following activities: 11/30/2018 08/10/2018  Hearing? N N  Vision? N N  Difficulty concentrating or making decisions? Aggie Moats  Walking or climbing stairs? Y Y  Dressing or bathing? Y N  Doing errands, shopping? Tempie Donning  Preparing Food and eating ? Y N  Using the Toilet? Y N  In the past six months, have you accidently leaked urine? N Y  Do you have problems with loss of bowel control? N Y  Managing your Medications? N N  Managing your Finances? N N  Housekeeping or managing your Housekeeping? Y Y  Some recent data might be hidden    Fall/Depression Screening: Fall Risk  12/28/2018 11/30/2018 08/10/2018  Falls in the past year? 1 1 -  Comment - - -  Number falls in past yr: 1 1 2  or more  Injury with Fall? 0 0 -  Risk for fall due to : History of fall(s);Impaired  balance/gait;Impaired mobility Impaired balance/gait;Impaired mobility;History of fall(s) -  Risk for fall due to: Comment - - -  Follow up Falls evaluation completed;Falls prevention discussed Falls evaluation completed -   PHQ 2/9 Scores 12/28/2018 11/30/2018 07/21/2018 07/20/2018 06/13/2017 05/02/2017 04/13/2016  PHQ - 2 Score 2 2 2 2 1  0 0  PHQ- 9 Score 9 9 - 9 3 - -   THN CM Care Plan Problem One     Most Recent Value  Care Plan Problem One  Knowledge Deficit in Self Management of Hypertension  Role Documenting the Problem One  [place] for Problem One  Active  THN Long Term Goal   Patient will monitor blood pressure and document within the next 90 days   Interventions for Problem One Long Term Goal  Rn reiterated the mobnitoring of blood pressure. Per patient she is trwying to monitor but has not every day, RN will follow up with further discussion  THN CM Short Term Goal #1   Patient will be able to identify foods that are low in sodium within the next 30 days  Interventions for Short Term Goal #1  RN discussed eating habits. Patient is having the problems of preparing the meals and shopping with the weakness in the rt arm. RN will follow up for further discussion  THN CM Short Term Goal #2   Patient will have a better understanding of how to read food labels within the next 30 days  THN CM Short Term Goal #2 Met Date  12/28/18  [facility] CM Short Term Goal #3  Patient will verbalize speaking with social worker about assisted living facilities within the next 30 days  THN CM Short Term Goal #3 Start Date  12/28/18  Interventions for Short Tern Goal #3  Patient discussed with RN about brother wanting patient to gointo an assisted living facility. RN referred patient to Education officer, museum .       Assessment:  Patient last blood pressure is 123/58 Patient is taking medication as prescribed now Patient is considering moving into an assisted living facilities  Plan:  RN discussed  monitoring blood pressures Referred to Social Worker  RN discussed healthy eating RN will follow up outreach within the month of March  Garey Alleva [place] Management [phone]

## 2019-01-02 ENCOUNTER — Encounter: Payer: Self-pay | Admitting: *Deleted

## 2019-01-02 ENCOUNTER — Other Ambulatory Visit: Payer: Self-pay | Admitting: *Deleted

## 2019-01-02 NOTE — Patient Outreach (Signed)
[facility]) Care Management  01/02/2019  [name] [name] [birth year] [id number]   CSW was able to make initial contact with patient today to perform phone assessment, as well as assess and assist with social work needs and services.  CSW introduced self, explained role and types of services provided through [place] [facility] ([place] [facility]).  CSW further explained to patient that CSW works with patient's [name], also with [name], [name]. CSW then explained the reason for the call, indicating that Mrs. Pleasant thought that patient would benefit from social work services and resources to assist with long-term care placement into an assisted living facility.  CSW obtained two HIPAA compliant identifiers from patient, which included patient's name and date of birth.  Patient admitted that she is willing to consider long-term care placement into an assisted living facility; however, patient is not familiar with all the facilities in [facility], requesting that [name] mail her a list.  CSW agreed to place the following information in the mail to patient today: [facility] in [place] will then follow-up with patient next week, on Thursday, January 11, 2019 around 1:30PM, to ensure that she received the information and answer any questions that she may have at that time.  CSW encouraged patient to talk with her son, [name] to see if he would be willing to go to the [place] to apply for [place] Medicaid on behalf of patient.  [name], BSW, MSW, LCSW  Licensed Education officer, [facility]  Mailing [place] [address], [place], [place] [address] Physical Address-[address], [place], [place] [address] Toll Free Main # [phone] Fax # [phone] Cell # [phone]  Office # [phone] [name]

## 2019-01-03 ENCOUNTER — Ambulatory Visit (HOSPITAL_COMMUNITY)
Admission: RE | Admit: 2019-01-03 | Discharge: 2019-01-03 | Disposition: A | Discharge status: Home / Self Care | Payer: Medicare HMO | Source: Ambulatory Visit | Attending: Neurosurgery | Admitting: Neurosurgery

## 2019-01-03 ENCOUNTER — Other Ambulatory Visit (HOSPITAL_COMMUNITY): Payer: Self-pay | Admitting: Neurosurgery

## 2019-01-03 DIAGNOSIS — C801 Malignant (primary) neoplasm, unspecified: Secondary | ICD-10-CM | POA: Diagnosis not present

## 2019-01-03 DIAGNOSIS — C7800 Secondary malignant neoplasm of unspecified lung: Secondary | ICD-10-CM | POA: Diagnosis not present

## 2019-01-03 DIAGNOSIS — C50919 Malignant neoplasm of unspecified site of unspecified female breast: Secondary | ICD-10-CM | POA: Insufficient documentation

## 2019-01-03 DIAGNOSIS — C7951 Secondary malignant neoplasm of bone: Secondary | ICD-10-CM | POA: Diagnosis not present

## 2019-01-08 MED FILL — AMLODIPINE BESYLATE 10 MG T: 10 | 30 days supply | Qty: 30 | Fill #2

## 2019-01-08 MED FILL — IBRANCE 100 MG CAPSULE: 100 | 28 days supply | Qty: 21 | Fill #1

## 2019-01-08 MED FILL — GABAPENTIN 100 MG CAPSULE: 100 | 30 days supply | Qty: 30 | Fill #1

## 2019-01-08 MED FILL — ELIQUIS 5 MG TABLET: 5 | 30 days supply | Qty: 60 | Fill #0

## 2019-01-10 NOTE — Progress Notes (Signed)
Patient Care Team: [name], [name], MD as PCP - General (Internal Medicine) Curt Bears, MD (Hematology and Oncology) Paralee Cancel, MD (Orthopedic Surgery) Tyler Pita, MD (Radiation Oncology) Nicholas Lose, MD as Consulting Physician (Hematology and Oncology) Pleasant, Eppie Gibson, RN as [facility]  DIAGNOSIS:    ICD-10-CM   1. Breast cancer metastasized to bone, right ([place]) C50.911    C79.51     SUMMARY OF ONCOLOGIC HISTORY:   Breast cancer metastasized to bone, right ([place])   05/22/2001 Initial Diagnosis    Rt breast: stage IIb (T2, N1, M0) invasive right breast carcinoma with positive estrogen and progesterone receptor as well as HER-2/neu    06/12/2001 Surgery    right lumpectomy with right axillary lymph node dissection revealing 4/9 lymph nodes that were positive for malignancy.     08/12/2001 - 01/10/2002 Chemotherapy    Adj chemo CMF X 8     05/07/2002 - 12/14/2007 Anti-estrogen oral therapy    Adj Tamoxifen for 2 years and then switched to Letrozole (by Dr.Khan in Aug 2005), stopped Feb 2009    02/11/2003 - 04/05/2003 Radiation Therapy    Adj XRT by Tammi Klippel    03/18/2011 -  Anti-estrogen oral therapy    Anastrozole 1 mg daily    04/01/2011 Surgery    Left total hip replacement secondary to pathologic left femoral neck fracture    04/25/2017 Relapse/Recurrence    Anastrozole with Ibrance discontinued 05/30/2018 due to disease progression, Xgeva for bone metastases every 3 months    06/19/2018 Procedure    Liver biopsy: Metastatic breast cancer, ER 100%, PR 0%, Ki-67 50%, HER-2 negative    07/06/2018 Miscellaneous    Foundation 1 results: Microsatellite stable, tumor mutational burden 15 mutations per MB, ATRX, CCND1, CDK4 amplification, ERBB4 amplification, ESR1 amplification, FGFR 1, MDM2 amplification, MEM1, NST3 amplification, PIK3CA mutation, ZNF703 amplification; PDL 1: 0%    07/06/2018 -  Anti-estrogen oral therapy   Ibrance with Faslodex     CHIEF COMPLIANT: Follow-up on Faslodex and Ibrance with Zometa  INTERVAL HISTORY: AZHAR YOGI is a 78 y.o. with above-mentioned history of metastatic breast cancer with metastases to the liver, lung, and bones currently on Faslodex with Ibrance.A CT of her neck on 01/03/19 showed mass-like soft tissue thickening of the right subclavian neurovascular bundle suspicious for metastatic disease, multifocal osseous metastatic disease, and increased upper lung nodules. She presents to the clinic today with continued problems with right arm swelling as well as right arm numbness.  REVIEW OF SYSTEMS:   Constitutional: Denies fevers, chills or abnormal weight loss Eyes: Denies blurriness of vision Ears, nose, mouth, throat, and face: Denies mucositis or sore throat Respiratory: Denies cough, dyspnea or wheezes Cardiovascular: Denies palpitation, chest discomfort Gastrointestinal: Denies nausea, heartburn or change in bowel habits Skin: Denies abnormal skin rashes Lymphatics: Denies new lymphadenopathy or easy bruising Neurological: Right arm numbness and weakness, right arm swelling Behavioral/Psych: Mood is stable, no new changes  Extremities: No lower extremity edema, right arm swelling from DVT Breast: denies any pain or lumps or nodules in either breasts All other systems were reviewed with the patient and are negative.  I have reviewed the past medical history, past surgical history, social history and family history with the patient and they are unchanged from previous note.  ALLERGIES:  has No Known Allergies.  MEDICATIONS:  Current Outpatient Medications  Medication Sig Dispense Refill   acetaminophen (TYLENOL) 500 MG tablet Take 500 mg by mouth daily as  needed (arm pain).     ALPRAZolam (XANAX) 0.5 MG tablet Take 1 tablet (0.5 mg total) by mouth 2 (two) times daily as needed for anxiety or sleep. (Patient taking differently: Take 0.5 mg by mouth daily as  needed for anxiety or sleep. ) 60 tablet 3   amLODipine (NORVASC) 10 MG tablet Take 1 tablet (10 mg total) by mouth daily. 30 tablet 11   anastrozole (ARIMIDEX) 1 MG tablet TAKE 1 TABLET BY MOUTH ONCE DAILY 90 tablet 2   apixaban (ELIQUIS) 5 MG TABS tablet Take 1 tablet (5 mg total) by mouth 2 (two) times daily. Take 10 mg bid for 7 days orally and then decrease to 5 mg bid orally thereafter. 60 tablet 5   Cholecalciferol (VITAMIN D3) 1000 UNITS CAPS Take 1 capsule by mouth daily.      gabapentin (NEURONTIN) 100 MG capsule Take 1 capsule (100 mg total) by mouth at bedtime. 30 capsule 5   hydrochlorothiazide (HYDRODIURIL) 25 MG tablet Take 25 mg by mouth daily.  3   losartan (COZAAR) 100 MG tablet Take 100 mg by mouth daily.  3   meclizine (ANTIVERT) 12.5 MG tablet Take 1 tablet (12.5 mg total) by mouth 2 (two) times daily as needed for dizziness. 10 tablet 0   nystatin (MYCOSTATIN/NYSTOP) powder Apply 1 Dose topically daily.  3   palbociclib (IBRANCE) 100 MG capsule Take 1 capsule (100 mg total) by mouth daily with breakfast. Take whole with food. 21 capsule 6   No current facility-administered medications for this visit.    Facility-Administered Medications Ordered in Other Visits  Medication Dose Route Frequency Provider Last Rate Last Dose   denosumab (XGEVA) injection 120 mg  120 mg Subcutaneous Once Curt Bears, MD        PHYSICAL EXAMINATION: ECOG PERFORMANCE STATUS: 1 - Symptomatic but completely ambulatory  Vitals:   01/12/19 0833  BP: (!) 134/48  Pulse: 84  Resp: 16  Temp: 98 F (36.7 C)  SpO2: 98%   Filed Weights   01/12/19 0833  Weight: 205 lb 3.2 oz (93.1 kg)    GENERAL: alert, no distress and comfortable SKIN: skin color, texture, turgor are normal, no rashes or significant lesions EYES: normal, Conjunctiva are pink and non-injected, sclera clear OROPHARYNX: no exudate, no erythema and lips, buccal mucosa, and tongue normal  NECK: supple, thyroid  normal size, non-tender, without nodularity LYMPH: no palpable lymphadenopathy in the cervical, axillary or inguinal LUNGS: clear to auscultation and percussion with normal breathing effort HEART: regular rate & rhythm and no murmurs and no lower extremity edema ABDOMEN: abdomen soft, non-tender and normal bowel sounds MUSCULOSKELETAL: no cyanosis of digits and no clubbing  NEURO: alert & oriented x 3 with fluent speech, no focal motor/sensory deficits EXTREMITIES: No lower extremity edema  LABORATORY DATA:  I have reviewed the data as listed CMP Latest Ref Rng & Units 12/20/2018 12/13/2018 09/04/2018  Glucose 70 - 99 mg/dL 98 101(H) 96  BUN 8 - 23 mg/dL 17 24(H) 26(H)  Creatinine 0.44 - 1.00 mg/dL 1.90(H) 2.15(H) 1.55(H)  Sodium 135 - 145 mmol/L 140 140 142  Potassium 3.5 - 5.1 mmol/L 3.9 3.7 4.9  Chloride 98 - 111 mmol/L 106 105 106  CO2 22 - 32 mmol/L _0 Calcium 8.9 - 10.3 mg/dL 8.9 9.8 9.5  Total Protein 6.5 - 8.1 g/dL 7.8 - 7.2  Total Bilirubin 0.3 - 1.2 mg/dL 0.3 - 0.4  Alkaline Phos 38 - 126 U/L 73 - 56  AST 15 - 41 U/L 19 - 13(L)  ALT 0 - 44 U/L 7 - 10    Lab Results  Component Value Date   WBC 4.7 12/20/2018   HGB 9.8 (L) 12/20/2018   HCT 29.9 (L) 12/20/2018   MCV 112.8 (H) 12/20/2018   PLT 226 12/20/2018   NEUTROABS 2.8 12/20/2018    ASSESSMENT & PLAN:  Breast cancer metastasized to bone, right ([facility]) Metastatic breast cancer with bone metastases along with lung and liver metastases, progressed on Ibrance with anastrozole.  Radiology review: 05/23/2018 CT CAP:New liver metastases 3.1 cm,increase in lung nodules, increase in sclerotic lesion of T11 and T9and S1 vertebral body. Liver biopsy 06/19/2018: Metastatic breast cancer ER 100%, PR 0%, Ki-67 50%, HER-2 negative BRCA1 mutation: Negative  PDL 1: 0%and foundation 1:PI 3 kinase mutation was  identified ----------------------------------------------------------------------------------------------------------------------------------------------------------------------- Current treatment: Faslodex with Ibrance started8/29/2019  Patient's insurance rejected Xgeva.OnZometa every 3 months.   Right arm numbness and weakness:MRI 08/04/2018: Abnormal soft tissue within and lateral to C7 to T1 neuroforamina of uncertain etiology.Bone metastases T2 and scans. MRI cervical spine 08/05/2018: Abnormal soft tissue C7-T1 could be metastases or could be disc herniation or it could also be a nerve sheath tumor.  CT neck 01/03/2019: Extensive masslike soft tissue thickening along the right subclavian neurovascular bundle, suspicious for perineural metastatic disease in conjunction with persistent abnormal enhancement of the right C8 nerve.  Multifocal bone metastatic disease, mildly increased upper lung nodules  Plan:  1.  I called and discussed the case with Dr. Isidore Moos for palliative radiation to the neck as well as the subclavian area.  The plan is to discuss her case in the tumor conference and make a decision. 2.  Patient will hold Ibrance during radiation. 3. We need to perform restaging scans to determine if the metastatic disease is getting better or worse.  If there is worsening disease and we will have to switch her to a different therapy, most likely with Alpelisib.  I will do restaging scans 1 month after radiation is complete.   No orders of the defined types were placed in this encounter.  The patient has a good understanding of the overall plan. she agrees with it. she will call with any problems that may develop before the next visit here.  Nicholas Lose, MD 01/12/2019  Julious Oka Dorshimer am acting as scribe for Dr. Nicholas Lose.  I have reviewed the above documentation for accuracy and completeness, and I agree with the above.

## 2019-01-11 ENCOUNTER — Ambulatory Visit (INDEPENDENT_AMBULATORY_CARE_PROVIDER_SITE_OTHER): Payer: Medicare HMO | Admitting: Internal Medicine

## 2019-01-11 ENCOUNTER — Encounter: Payer: Self-pay | Admitting: Internal Medicine

## 2019-01-11 ENCOUNTER — Ambulatory Visit: Payer: Medicare HMO

## 2019-01-11 ENCOUNTER — Ambulatory Visit: Payer: Medicare HMO | Admitting: Hematology and Oncology

## 2019-01-11 ENCOUNTER — Other Ambulatory Visit: Payer: Self-pay | Admitting: *Deleted

## 2019-01-11 ENCOUNTER — Encounter: Payer: Self-pay | Admitting: *Deleted

## 2019-01-11 DIAGNOSIS — I82A11 Acute embolism and thrombosis of right axillary vein: Secondary | ICD-10-CM

## 2019-01-11 DIAGNOSIS — E559 Vitamin D deficiency, unspecified: Secondary | ICD-10-CM | POA: Diagnosis not present

## 2019-01-11 DIAGNOSIS — E1329 Other specified diabetes mellitus with other diabetic kidney complication: Secondary | ICD-10-CM

## 2019-01-11 DIAGNOSIS — R200 Anesthesia of skin: Secondary | ICD-10-CM | POA: Diagnosis not present

## 2019-01-11 DIAGNOSIS — I10 Essential (primary) hypertension: Secondary | ICD-10-CM | POA: Diagnosis not present

## 2019-01-11 MED ORDER — APIXABAN 5 MG PO TABS: 5.0000 mg | ORAL_TABLET | Freq: Two times a day (BID) | ORAL | 5 refills | Status: AC

## 2019-01-11 MED ORDER — GABAPENTIN 100 MG PO CAPS: 100.0000 mg | ORAL_CAPSULE | Freq: Every day | ORAL | 5 refills | Status: AC

## 2019-01-11 NOTE — Patient Outreach (Signed)
[facility]) Care Management  01/11/2019  [name] [name] 1941/02/23 [id number]   CSW was able to make contact with patient today to follow-up regarding social work services and resources, as well as to ensure that patient received the packet of resource information that CSW mailed to patient's home.  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  Patient went on to say that [name] is fully handicapped accessible and that she will be moving into her new apartment the first week in May, 2020.    Patient appeared to be very excited about her new living arrangements, reporting that her son is already making arrangements to help her move.  CSW will perform a case closure on patient, as all goals of treatment have been met from social work standpoint and no additional social work needs have been identified at this time.  CSW will notify patient's [name] with [facility], [name] of CSW's plans to close patient's case.  CSW will fax an update to patient's Primary Care Physician, Dr. Tyrone Apple [name] to ensure that they are aware of CSW's involvement with patient's plan of care.    [name], BSW, MSW, LCSW  Licensed Education officer, [facility]  Mailing [place] [address], [place], [place] [address] Physical Address-[address], [place],  [address] Toll Free Main # [phone] Fax # [phone] Cell # [phone]  Office # [phone] [email or web address]

## 2019-01-11 NOTE — Assessment & Plan Note (Addendum)
UE VEN DUPLEX 12/13/18:  Right: Findings consistent with acute deep vein thrombosis involving the right brachial veins. Findings consistent with chronic deep vein thrombosis involving the right subclavian vein and right axillary vein.   Left: No evidence of thrombosis in the subclavian.  On Eliquis Elastic support glove R Onc f/u tomorrow Labs

## 2019-01-11 NOTE — Progress Notes (Signed)
Subjective:  Patient ID: [name] [name], female    DOB: 11/29/1940  Age: 78 y.o. MRN: [id number]  CC: No chief complaint on file.   HPI [name] [name] presents for RUE DVT, breast cancer, HTN f/u. On Eliquis C/o pain and swelling of the r arm/hand   UE VEN DUPLEX 12/13/18:  Right: Findings consistent with acute deep vein thrombosis involving the right brachial veins. Findings consistent with chronic deep vein thrombosis involving the right subclavian vein and right axillary vein.   Left: No evidence of thrombosis in the subclavian.     Outpatient Medications Prior to Visit  Medication Sig Dispense Refill  . acetaminophen (TYLENOL) 500 MG tablet Take 500 mg by mouth daily as needed (arm pain).    Marland Kitchen ALPRAZolam (XANAX) 0.5 MG tablet Take 1 tablet (0.5 mg total) by mouth 2 (two) times daily as needed for anxiety or sleep. (Patient taking differently: Take 0.5 mg by mouth daily as needed for anxiety or sleep. ) 60 tablet 3  . amLODipine (NORVASC) 10 MG tablet Take 1 tablet (10 mg total) by mouth daily. 30 tablet 11  . anastrozole (ARIMIDEX) 1 MG tablet TAKE 1 TABLET BY MOUTH ONCE DAILY 90 tablet 2  . apixaban (ELIQUIS) 5 MG TABS tablet Take 10 mg bid for 7 days orally and then decrease to 5 mg bid orally thereafter. 74 tablet 0  . apixaban (ELIQUIS) 5 MG TABS tablet Take 1 tablet (5 mg total) by mouth 2 (two) times daily. 10 mg twice daily for 7 days and then 5 mg twice daily after that 90 tablet 3  . Cholecalciferol (VITAMIN D3) 1000 UNITS CAPS Take 1 capsule by mouth daily.     Marland Kitchen gabapentin (NEURONTIN) 100 MG capsule Take 1 capsule (100 mg total) by mouth at bedtime. 30 capsule 1  . hydrochlorothiazide (HYDRODIURIL) 25 MG tablet Take 25 mg by mouth daily.  3  . losartan (COZAAR) 100 MG tablet Take 100 mg by mouth daily.  3  . meclizine (ANTIVERT) 12.5 MG tablet Take 1 tablet (12.5 mg total) by mouth 2 (two) times daily as needed for dizziness. 10 tablet 0  . nystatin  (MYCOSTATIN/NYSTOP) powder Apply 1 Dose topically daily.  3  . palbociclib (IBRANCE) 100 MG capsule Take 1 capsule (100 mg total) by mouth daily with breakfast. Take whole with food. 21 capsule 6   Facility-Administered Medications Prior to Visit  Medication Dose Route Frequency Provider Last Rate Last Dose  . denosumab (XGEVA) injection 120 mg  120 mg Subcutaneous Once Curt Bears, MD        ROS: Review of Systems  Constitutional: Positive for fatigue. Negative for activity change, appetite change, chills and unexpected weight change.  HENT: Negative for congestion, mouth sores and sinus pressure.   Eyes: Negative for visual disturbance.  Respiratory: Negative for cough and chest tightness.   Gastrointestinal: Negative for abdominal pain and nausea.  Genitourinary: Negative for difficulty urinating, frequency and vaginal pain.  Musculoskeletal: Positive for arthralgias, back pain and gait problem.  Skin: Negative for pallor and rash.  Neurological: Negative for dizziness, tremors, weakness, numbness and headaches.  Psychiatric/Behavioral: Negative for confusion, sleep disturbance and suicidal ideas.    Objective:  BP 126/74   Pulse 82   Temp 98.1 F (36.7 C) (Oral)   Ht 5\' 3"  (1.6 m)   Wt 203 lb (92.1 kg)   SpO2 94%   BMI 35.96 kg/m   BP Readings from Last 3 Encounters:  01/11/19 126/74  12/20/18 (!) 123/58  12/13/18 133/60    Wt Readings from Last 3 Encounters:  01/11/19 203 lb (92.1 kg)  12/20/18 199 lb 12.8 oz (90.6 kg)  12/13/18 199 lb 11.2 oz (90.6 kg)    Physical Exam Constitutional:      General: She is not in acute distress.    Appearance: She is well-developed.  HENT:     Head: Normocephalic.     Right Ear: External ear normal.     Left Ear: External ear normal.     Nose: Nose normal.  Eyes:     General:        Right eye: No discharge.        Left eye: No discharge.     Conjunctiva/sclera: Conjunctivae normal.     Pupils: Pupils are equal,  round, and reactive to light.  Neck:     Musculoskeletal: Normal range of motion and neck supple.     Thyroid: No thyromegaly.     Vascular: No JVD.     Trachea: No tracheal deviation.  Cardiovascular:     Rate and Rhythm: Normal rate and regular rhythm.     Heart sounds: Normal heart sounds.  Pulmonary:     Effort: No respiratory distress.     Breath sounds: No stridor. No wheezing.  Abdominal:     General: Bowel sounds are normal. There is no distension.     Palpations: Abdomen is soft. There is no mass.     Tenderness: There is no abdominal tenderness. There is no guarding or rebound.  Musculoskeletal:        General: Swelling, tenderness and deformity present.     Right lower leg: No edema.  Lymphadenopathy:     Cervical: No cervical adenopathy.  Skin:    Findings: No bruising, erythema or rash.  Neurological:     Cranial Nerves: No cranial nerve deficit.     Motor: No abnormal muscle tone.     Coordination: Coordination normal.     Deep Tendon Reflexes: Reflexes normal.  Psychiatric:        Behavior: Behavior normal.        Thought Content: Thought content normal.        Judgment: Judgment normal.       Result Date: 01/03/2019 CLINICAL DATA:  78 year old female with metastatic breast cancer, metastases to the liver lung and bones. Right arm numbness and weakness. Right upper extremity DVT diagnosed last month, started on Eliquis. EXAM: CT NECK WITHOUT CONTRAST TECHNIQUE: Multidetector CT imaging of the neck was performed following the standard protocol without intravenous contrast. COMPARISON:  Cervical spine MRI without contrast 08/05/2018. chest CT 05/23/2018 FINDINGS: Pharynx and larynx: Noncontrast laryngeal and pharyngeal soft tissue contours are within normal limits. Negative parapharyngeal and retropharyngeal spaces. Salivary glands: Negative sublingual space. Negative noncontrast submandibular and parotid glands. Thyroid: Negative. Lymph nodes: No cervical  lymphadenopathy is identified in the absence of IV contrast. Vascular: There is a left chest IJ approach porta cath in place. Vascular patency is not evaluated in the absence of IV contrast. Abnormal masslike soft tissue thickening along the right subclavian neurovascular bundle best demonstrated on series 6, image 47. There are small surgical clips adjacent to some of the soft tissue abnormality on coronal image 43 and also series 2, image 91. Limited intracranial: Negative visible noncontrast brain parenchyma. Visualized orbits: Negative. Mastoids and visualized paranasal sinuses: Well pneumatized. Skeleton: The right C8 neural foraminal grounded soft tissue mass is subtle by CT but identified on  sagittal series 7, image 41. This is also fairly well demonstrated on series 2, image 72, and is contiguous along the course of the right C8 nerve with the abnormal right subclavian neurovascular bundle described earlier. Also in this region, there is subtle erosion of the right C7 superior articulating facet on series 7, image 43 near the exiting right C7 nerve. This would be a new finding since October. However, no other cervical spine osteolysis is identified. Absent dentition. No suspicious bone lesion identified at the skull base. There is abnormal sclerosis in heterogeneity in some of the visible upper thorax including the posterior left 2nd and 3rd ribs, T5 vertebral body, manubrium. Upper chest: Mildly increased left upper lung pulmonary nodules since July 2019 which are rounded, spiculated, suspicious for pulmonary metastases. The largest is 14 millimeters on series 4, image 117 (11 millimeters previously). Possible increased right apical nodule on series 4, image 92. No superior mediastinal lymphadenopathy is evident. IMPRESSION: 1. Extensive mass-like soft tissue thickening along the right subclavian neurovascular bundle best demonstrated on coronal series 6, image 47. While some of this might reflect the  reported recent right upper extremity DVT, the appearance is highly suspicious for perineural metastatic disease in conjunction with persistent abnormal enlargement of the right C8 nerve, and there are some surgical clips in the region. 2. No other neck soft tissue metastasis identified in the absence of IV contrast. Multifocal osseous metastatic disease, including suspected subtle involvement of the right C7 superior articulating facet. 3. Mildly increased upper lung nodules since July 2019 suspicious for pulmonary metastases. Electronically Signed   By: Genevie Ann M.D.   On: 01/03/2019 17:05    Assessment & Plan:   There are no diagnoses linked to this encounter.   No orders of the defined types were placed in this encounter.    Follow-up: No follow-ups on file.  Walker Kehr, MD

## 2019-01-11 NOTE — Assessment & Plan Note (Signed)
Vit D 

## 2019-01-11 NOTE — Assessment & Plan Note (Signed)
Hyzaar, Amlodipine

## 2019-01-11 NOTE — Assessment & Plan Note (Signed)
Lab Results  Component Value Date   HGBA1C 6.0 06/13/2017

## 2019-01-12 ENCOUNTER — Inpatient Hospital Stay: Payer: Medicare HMO | Attending: Oncology | Admitting: Hematology and Oncology

## 2019-01-12 ENCOUNTER — Ambulatory Visit
Admission: RE | Admit: 2019-01-12 | Discharge: 2019-01-12 | Disposition: A | Discharge status: Home / Self Care | Payer: Medicare HMO | Source: Ambulatory Visit | Attending: Radiation Oncology | Admitting: Radiation Oncology

## 2019-01-12 ENCOUNTER — Other Ambulatory Visit: Payer: Self-pay

## 2019-01-12 ENCOUNTER — Other Ambulatory Visit: Payer: Self-pay | Admitting: Radiation Therapy

## 2019-01-12 ENCOUNTER — Inpatient Hospital Stay: Payer: Medicare HMO

## 2019-01-12 ENCOUNTER — Encounter: Payer: Self-pay | Admitting: Radiation Oncology

## 2019-01-12 VITALS — BP 149/51 | HR 71 | Temp 98.0°F | Resp 18

## 2019-01-12 DIAGNOSIS — Z79818 Long term (current) use of other agents affecting estrogen receptors and estrogen levels: Secondary | ICD-10-CM | POA: Insufficient documentation

## 2019-01-12 DIAGNOSIS — C7951 Secondary malignant neoplasm of bone: Secondary | ICD-10-CM | POA: Insufficient documentation

## 2019-01-12 DIAGNOSIS — I10 Essential (primary) hypertension: Secondary | ICD-10-CM | POA: Diagnosis not present

## 2019-01-12 DIAGNOSIS — Z95828 Presence of other vascular implants and grafts: Secondary | ICD-10-CM

## 2019-01-12 DIAGNOSIS — Z86718 Personal history of other venous thrombosis and embolism: Secondary | ICD-10-CM | POA: Diagnosis not present

## 2019-01-12 DIAGNOSIS — Z8673 Personal history of transient ischemic attack (TIA), and cerebral infarction without residual deficits: Secondary | ICD-10-CM | POA: Diagnosis not present

## 2019-01-12 DIAGNOSIS — E785 Hyperlipidemia, unspecified: Secondary | ICD-10-CM | POA: Insufficient documentation

## 2019-01-12 DIAGNOSIS — C787 Secondary malignant neoplasm of liver and intrahepatic bile duct: Secondary | ICD-10-CM | POA: Insufficient documentation

## 2019-01-12 DIAGNOSIS — Z79899 Other long term (current) drug therapy: Secondary | ICD-10-CM | POA: Insufficient documentation

## 2019-01-12 DIAGNOSIS — Z9221 Personal history of antineoplastic chemotherapy: Secondary | ICD-10-CM | POA: Insufficient documentation

## 2019-01-12 DIAGNOSIS — C50911 Malignant neoplasm of unspecified site of right female breast: Secondary | ICD-10-CM | POA: Diagnosis not present

## 2019-01-12 DIAGNOSIS — Z510 Encounter for antineoplastic radiation therapy: Secondary | ICD-10-CM | POA: Diagnosis not present

## 2019-01-12 DIAGNOSIS — F419 Anxiety disorder, unspecified: Secondary | ICD-10-CM | POA: Diagnosis not present

## 2019-01-12 DIAGNOSIS — Z79811 Long term (current) use of aromatase inhibitors: Secondary | ICD-10-CM | POA: Diagnosis not present

## 2019-01-12 DIAGNOSIS — Z7901 Long term (current) use of anticoagulants: Secondary | ICD-10-CM | POA: Diagnosis not present

## 2019-01-12 DIAGNOSIS — C7800 Secondary malignant neoplasm of unspecified lung: Secondary | ICD-10-CM | POA: Diagnosis not present

## 2019-01-12 DIAGNOSIS — Z170 Estrogen receptor positive status [ER+]: Secondary | ICD-10-CM | POA: Insufficient documentation

## 2019-01-12 DIAGNOSIS — R918 Other nonspecific abnormal finding of lung field: Secondary | ICD-10-CM | POA: Insufficient documentation

## 2019-01-12 DIAGNOSIS — C773 Secondary and unspecified malignant neoplasm of axilla and upper limb lymph nodes: Secondary | ICD-10-CM | POA: Diagnosis not present

## 2019-01-12 DIAGNOSIS — Z96641 Presence of right artificial hip joint: Secondary | ICD-10-CM | POA: Insufficient documentation

## 2019-01-12 DIAGNOSIS — Z87891 Personal history of nicotine dependence: Secondary | ICD-10-CM | POA: Diagnosis not present

## 2019-01-12 MED ORDER — FULVESTRANT 250 MG/5ML IM SOLN
INTRAMUSCULAR | Status: AC
  Filled 2019-01-12: qty 5

## 2019-01-12 MED ORDER — FULVESTRANT 250 MG/5ML IM SOLN
500.0000 mg | Freq: Once | INTRAMUSCULAR | Status: AC
  Administered 2019-01-12: 500 mg via INTRAMUSCULAR

## 2019-01-12 NOTE — Progress Notes (Signed)
Histology and Location of Primary Cancer:   05/22/2001 Initial Diagnosis    Rt breast: stage IIb (T2, N1, M0) invasive right breast carcinoma with positive estrogen and progesterone receptor as well as HER-2/neu    Sites of Visceral and Bony Metastatic Disease:  Metastatic breast cancer with bone metastases along with lung and liver metastases, progressed on Ibrance with anastrozole.  Location(s) of Symptomatic Metastases: neck and subclavian area  Past/Anticipated chemotherapy by medical oncology, if any:  Dr. Lindi Adie 01/12/19 Plan:  1.  I called and discussed the case with Dr. Isidore Moos for palliative radiation to the neck as well as the subclavian area.  The plan is to discuss her case in the tumor conference and make a decision. 2.  Patient will hold Ibrance during radiation. 3. We need to perform restaging scans to determine if the metastatic disease is getting better or worse.  If there is worsening disease and we will have to switch her to a different therapy, most likely with Alpelisib.  I will do restaging scans 1 month after radiation is complete.   Pain on a scale of 0-10 is: She denies current pain   If Spine Met(s), symptoms, if any, include: N/A  Bowel/Bladder retention or incontinence (please describe):   Numbness or weakness in extremities (please describe):   Current Decadron regimen, if applicable:   Ambulatory status? Walker? Wheelchair?: She is using a walker today.  SAFETY ISSUES:  Prior radiation? Yes, breast radiation 2004  Pacemaker/ICD? No  Possible current pregnancy? No  Is the patient on methotrexate? no  Current Complaints / other details:     Dr. Geralyn Flash office visit today: Temp 98.0, BP 149/51, 71 HR, 18 Resp, 100% RA

## 2019-01-12 NOTE — Patient Instructions (Signed)

## 2019-01-12 NOTE — Assessment & Plan Note (Signed)
Metastatic breast cancer with bone metastases along with lung and liver metastases, progressed on Ibrance with anastrozole.  Radiology review: 05/23/2018 CT CAP:New liver metastases 3.1 cm,increase in lung nodules, increase in sclerotic lesion of T11 and T9and S1 vertebral body. Liver biopsy 06/19/2018: Metastatic breast cancer ER 100%, PR 0%, Ki-67 50%, HER-2 negative BRCA1 mutation: Negative  PDL 1: 0%and foundation 1:PI 3 kinase mutation was identified ----------------------------------------------------------------------------------------------------------------------------------------------------------------------- Current treatment: Faslodex with Ibrance started8/29/2019  Patient's insurance rejected Xgeva.OnZometa every 3 months.   Right arm numbness and weakness:MRI 08/04/2018: Abnormal soft tissue within and lateral to C7 to T1 neuroforamina of uncertain etiology.Bone metastases T2 and scans. MRI cervical spine 08/05/2018: Abnormal soft tissue C7-T1 could be metastases or could be disc herniation or it could also be a nerve sheath tumor.  CT neck 01/03/2019: Extensive masslike soft tissue thickening along the right subclavian neurovascular bundle, suspicious for perineural metastatic disease in conjunction with persistent abnormal enhancement of the right C8 nerve.  Multifocal bone metastatic disease, mildly increased upper lung nodules  Plan: We need to perform staging scans to determine if the metastatic disease is getting better or worse.  If there is worsening disease and we will have to switch her to a different therapy, most likely with Alpelisib.

## 2019-01-15 ENCOUNTER — Other Ambulatory Visit: Payer: Self-pay | Admitting: Radiation Oncology

## 2019-01-15 ENCOUNTER — Encounter: Payer: Self-pay | Admitting: Radiation Oncology

## 2019-01-15 ENCOUNTER — Telehealth: Payer: Self-pay | Admitting: Radiation Therapy

## 2019-01-15 ENCOUNTER — Other Ambulatory Visit: Payer: Self-pay | Admitting: Radiation Therapy

## 2019-01-15 DIAGNOSIS — C773 Secondary and unspecified malignant neoplasm of axilla and upper limb lymph nodes: Secondary | ICD-10-CM | POA: Insufficient documentation

## 2019-01-15 DIAGNOSIS — C50919 Malignant neoplasm of unspecified site of unspecified female breast: Secondary | ICD-10-CM

## 2019-01-15 NOTE — Telephone Encounter (Signed)
I spoke with [name] [name] about her upcoming MRI on 3/21 and the [facility] appointment we have scheduled for 3/23. She was happy with this plan and plans to make it to both appointments using [facility] transportation.    Mont Dutton R.T.(R)(T) Special Procedures Navigator

## 2019-01-15 NOTE — Progress Notes (Signed)
Radiation Oncology         [phone] ________________________________  Outpatient Re-Consultation  Name: [name] [name] MRN: [id number]  Date: 01/12/2019  DOB: 1941-05-15  OM:VEHMCNOBS, [name] Lacks, MD  Curt Bears, MD   REFERRING PHYSICIAN: Curt Bears, MD  DIAGNOSIS:    ICD-10-CM   1. Breast cancer metastasized to bone, right (HCC) C50.911    C79.51   2. Secondary malignant neoplasm of axillary node (HCC) C77.3     CHIEF COMPLAINT: Here to discuss management of axillary mass  HISTORY OF PRESENT ILLNESS::[name] [name] is a 78 y.o. female who presented with Histology and Location of Primary Cancer:   05/22/2001 Initial Diagnosis    Rt breast: stage IIb (T2, N1, M0) invasive right breast carcinoma with positive estrogen and progesterone receptor as well as HER-2/neu   Sites of Visceral and Bony Metastatic Disease:  Metastatic breast cancer with bone metastases along with lung and liver metastases, progressed on Ibrance with anastrozole.  Location(s) of Symptomatic Metastases: neck and subclavian area --> right ARM  CT scan of neck on 01/03/2019 revealed the following.  I looked at her images myself. 1. Extensive mass-like soft tissue thickening along the right subclavian neurovascular bundle best demonstrated on coronal series 6, image 47. While some of this might reflect the reported recent right upper extremity DVT, the appearance is highly suspicious for perineural metastatic disease in conjunction with persistent abnormal enlargement of the right C8 nerve, and there are some surgical clips in the region. 2. No other neck soft tissue metastasis identified in the absence of IV contrast. Multifocal osseous metastatic disease, including suspected subtle involvement of the right C7 superior articulating facet. 3. Mildly increased upper lung nodules since July 2019 suspicious for pulmonary metastases.  Today Dr. Lindi Adie called and discussed the case with me  for palliative radiation to the neck as well as the subclavian area. Patient will hold Ibrance during radiation  ... Dr Lindi Adie will to perform restaging scans to determine if the metastatic disease is getting better or worse.  If there is worsening disease and we will have to switch her to a different therapy, most likely with Alpelisib. He will do restaging scans 1 month after radiation is complete.   Pain on a scale of 0-10 is: She denies current pain   If Spine Met(s), symptoms, if any, include: N/A  Bowel/Bladder retention or incontinence (please describe): NA  Numbness or weakness in extremities (please describe): Yes, numbness and weakness in entire right arm/hand, especially distal to elbow, for 4-5 months. Difficulty using her hand.  Current Right arm swelling r/t to DVT, under treatment for this.  Current Decadron regimen, if applicable: NA  Ambulatory status? Walker? Wheelchair?: She is using a walker today.  SAFETY ISSUES:  Prior radiation? Yes, breast radiation 2004 including 50.4Gy to axilla/SCV Tammi Klippel)  Pacemaker/ICD? No  Possible current pregnancy? No  Is the patient on methotrexate? no  Current Complaints / other details:     Temp 98.0, BP 149/51, 71 HR, 18 Resp, 100% RA  PREVIOUS RADIATION THERAPY: Yes as above  PAST MEDICAL HISTORY:  has a past medical history of Anxiety, Asthma, Breast cancer ([place]), Cerebrovascular accident [facility]), Chemotherapy induced neutropenia ([place]) (08/15/2017), Dyslipidemia, Encounter for therapeutic drug monitoring (04/14/2017), Goals of care, counseling/discussion (04/19/2017), Hyperlipemia, Hypertension, Low back pain, Osteoarthritis, TIA (transient ischemic attack), and Vertigo (2014).    PAST SURGICAL HISTORY: Past Surgical History:  Procedure Laterality Date   ABDOMINAL HYSTERECTOMY     complete  BREAST LUMPECTOMY     Right breast 2002   JOINT REPLACEMENT  2012   L hip due to breast ca met   TOTAL HIP ARTHROPLASTY Right  10/28/2015   Procedure: RIGHT TOTAL HIP ARTHROPLASTY ANTERIOR APPROACH;  Surgeon: Paralee Cancel, MD;  Location: [facility];  Service: Orthopedics;  Laterality: Right;    FAMILY HISTORY: family history includes Hypertension in her father and mother.  SOCIAL HISTORY:  reports that she quit smoking about 7 years ago. Her smoking use included cigarettes. She has a 40.00 pack-year smoking history. She has never used smokeless tobacco. She reports current alcohol use. She reports that she does not use drugs.  ALLERGIES: Patient has no known allergies.  MEDICATIONS:  Current Outpatient Medications  Medication Sig Dispense Refill   acetaminophen (TYLENOL) 500 MG tablet Take 500 mg by mouth daily as needed (arm pain).     ALPRAZolam (XANAX) 0.5 MG tablet Take 1 tablet (0.5 mg total) by mouth 2 (two) times daily as needed for anxiety or sleep. (Patient taking differently: Take 0.5 mg by mouth daily as needed for anxiety or sleep. ) 60 tablet 3   amLODipine (NORVASC) 10 MG tablet Take 1 tablet (10 mg total) by mouth daily. 30 tablet 11   anastrozole (ARIMIDEX) 1 MG tablet TAKE 1 TABLET BY MOUTH ONCE DAILY 90 tablet 2   apixaban (ELIQUIS) 5 MG TABS tablet Take 1 tablet (5 mg total) by mouth 2 (two) times daily. Take 10 mg bid for 7 days orally and then decrease to 5 mg bid orally thereafter. 60 tablet 5   Cholecalciferol (VITAMIN D3) 1000 UNITS CAPS Take 1 capsule by mouth daily.      gabapentin (NEURONTIN) 100 MG capsule Take 1 capsule (100 mg total) by mouth at bedtime. 30 capsule 5   hydrochlorothiazide (HYDRODIURIL) 25 MG tablet Take 25 mg by mouth daily.  3   losartan (COZAAR) 100 MG tablet Take 100 mg by mouth daily.  3   meclizine (ANTIVERT) 12.5 MG tablet Take 1 tablet (12.5 mg total) by mouth 2 (two) times daily as needed for dizziness. 10 tablet 0   nystatin (MYCOSTATIN/NYSTOP) powder Apply 1 Dose topically daily.  3   palbociclib (IBRANCE) 100 MG capsule Take 1 capsule (100 mg total) by  mouth daily with breakfast. Take whole with food. 21 capsule 6   No current facility-administered medications for this encounter.    Facility-Administered Medications Ordered in Other Encounters  Medication Dose Route Frequency Provider Last Rate Last Dose   denosumab (XGEVA) injection 120 mg  120 mg Subcutaneous Once Curt Bears, MD        REVIEW OF SYSTEMS:  Notable for that above.   PHYSICAL EXAM:  See vitals above Right arm swelling  No palpable masses in right axilla/ SCV  Right arm numbness, most pronounced distal to elbow.  Decreased strength in right arm/hand  ECOG = 2  0 - Asymptomatic (Fully active, able to carry on all predisease activities without restriction)  1 - Symptomatic but completely ambulatory (Restricted in physically strenuous activity but ambulatory and able to carry out work of a light or sedentary nature. For example, light housework, office work)  2 - Symptomatic, <50% in bed during the day (Ambulatory and capable of all self care but unable to carry out any work activities. Up and about more than 50% of waking hours)  3 - Symptomatic, >50% in bed, but not bedbound (Capable of only limited self-care, confined to bed or chair 50%  or more of waking hours)  4 - Bedbound (Completely disabled. Cannot carry on any self-care. Totally confined to bed or chair)  5 - Death   Eustace Pen MM, Creech RH, Tormey DC, et al. [phone]). "Toxicity and response criteria of the [facility]". [place] Oncol. 5 (6): 649-55   LABORATORY DATA:  Lab Results  Component Value Date   WBC 4.7 12/20/2018   HGB 9.8 (L) 12/20/2018   HCT 29.9 (L) 12/20/2018   MCV 112.8 (H) 12/20/2018   PLT 226 12/20/2018   CMP     Component Value Date/Time   NA 140 12/20/2018 1002   NA 140 11/03/2017 1044   K 3.9 12/20/2018 1002   K 3.5 11/03/2017 1044   CL 106 12/20/2018 1002   CL 109 (H) 04/25/2013 1023   CO2 24 12/20/2018 1002   CO2 23 11/03/2017 1044    GLUCOSE 98 12/20/2018 1002   GLUCOSE 103 11/03/2017 1044   GLUCOSE 108 (H) 04/25/2013 1023   BUN 17 12/20/2018 1002   BUN 9.7 11/03/2017 1044   CREATININE 1.90 (H) 12/20/2018 1002   CREATININE 1.4 (H) 11/03/2017 1044   CALCIUM 8.9 12/20/2018 1002   CALCIUM 9.2 11/03/2017 1044   PROT 7.8 12/20/2018 1002   PROT 8.2 11/03/2017 1044   ALBUMIN 3.6 12/20/2018 1002   ALBUMIN 3.6 11/03/2017 1044   AST 19 12/20/2018 1002   AST 27 11/03/2017 1044   ALT 7 12/20/2018 1002   ALT 16 11/03/2017 1044   ALKPHOS 73 12/20/2018 1002   ALKPHOS 74 11/03/2017 1044   BILITOT 0.3 12/20/2018 1002   BILITOT 0.75 11/03/2017 1044   GFRNONAA 25 (L) 12/20/2018 1002   GFRAA 29 (L) 12/20/2018 1002         RADIOGRAPHY: Ct Soft Tissue Neck Wo Contrast  Result Date: 01/03/2019 CLINICAL DATA:  78 year old female with metastatic breast cancer, metastases to the liver lung and bones. Right arm numbness and weakness. Right upper extremity DVT diagnosed last month, started on Eliquis. EXAM: CT NECK WITHOUT CONTRAST TECHNIQUE: Multidetector CT imaging of the neck was performed following the standard protocol without intravenous contrast. COMPARISON:  Cervical spine MRI without contrast 08/05/2018. chest CT 05/23/2018 FINDINGS: Pharynx and larynx: Noncontrast laryngeal and pharyngeal soft tissue contours are within normal limits. Negative parapharyngeal and retropharyngeal spaces. Salivary glands: Negative sublingual space. Negative noncontrast submandibular and parotid glands. Thyroid: Negative. Lymph nodes: No cervical lymphadenopathy is identified in the absence of IV contrast. Vascular: There is a left chest IJ approach porta cath in place. Vascular patency is not evaluated in the absence of IV contrast. Abnormal masslike soft tissue thickening along the right subclavian neurovascular bundle best demonstrated on series 6, image 47. There are small surgical clips adjacent to some of the soft tissue abnormality on coronal image  43 and also series 2, image 91. Limited intracranial: Negative visible noncontrast brain parenchyma. Visualized orbits: Negative. Mastoids and visualized paranasal sinuses: Well pneumatized. Skeleton: The right C8 neural foraminal grounded soft tissue mass is subtle by CT but identified on sagittal series 7, image 41. This is also fairly well demonstrated on series 2, image 72, and is contiguous along the course of the right C8 nerve with the abnormal right subclavian neurovascular bundle described earlier. Also in this region, there is subtle erosion of the right C7 superior articulating facet on series 7, image 43 near the exiting right C7 nerve. This would be a new finding since October. However, no other cervical spine osteolysis is  identified. Absent dentition. No suspicious bone lesion identified at the skull base. There is abnormal sclerosis in heterogeneity in some of the visible upper thorax including the posterior left 2nd and 3rd ribs, T5 vertebral body, manubrium. Upper chest: Mildly increased left upper lung pulmonary nodules since July 2019 which are rounded, spiculated, suspicious for pulmonary metastases. The largest is 14 millimeters on series 4, image 117 (11 millimeters previously). Possible increased right apical nodule on series 4, image 92. No superior mediastinal lymphadenopathy is evident. IMPRESSION: 1. Extensive mass-like soft tissue thickening along the right subclavian neurovascular bundle best demonstrated on coronal series 6, image 47. While some of this might reflect the reported recent right upper extremity DVT, the appearance is highly suspicious for perineural metastatic disease in conjunction with persistent abnormal enlargement of the right C8 nerve, and there are some surgical clips in the region. 2. No other neck soft tissue metastasis identified in the absence of IV contrast. Multifocal osseous metastatic disease, including suspected subtle involvement of the right C7 superior  articulating facet. 3. Mildly increased upper lung nodules since July 2019 suspicious for pulmonary metastases. Electronically Signed   By: Genevie Ann M.D.   On: 01/03/2019 17:05      IMPRESSION/PLAN:Today, I talked to the patient about the findings and work-up thus far. We discussed the patient's diagnosis of likely metastatic/recurrent cancer in the right axilla and general treatment for this, highlighting the role of radiotherapy in the management. We discussed the available radiation techniques, and focused on the details of logistics and delivery.    We discussed her initial nodal positivity when she was diagnosed about 15 years ago.  Positive nodes were resected and then she received several weeks of radiotherapy to this area.  I suspect that she has recurrent disease related to her breast cancer in this area and this may have pressed upon her vessels thus predisposing her to a DVT.  I will review her at tumor board on Monday with our neuroradiology team (addendum: Patient was discussed and consensus is that this is highly suspicious for metastatic breast cancer.  Recommendation was made for MRI of the brachial plexus to better define the mass before treatment; we will do this and schedule simulation to follow, anticipating 2 weeks of palliative radiotherapy)  We discussed the risks, benefits, and side effects of radiotherapy. Side effects may include but not necessarily be limited to: Further arm swelling, skin irritation, tenderness in the soft tissues, fatigue, injury to the nerves, tissue damage, increased risks due to prior radiotherapy; no guarantees of treatment were given. A consent form was signed and placed in the patient's medical record. The patient was encouraged to ask questions that I answered to the best of my ability.   We will proceed with plan above as discussed at tumor board.  I look forward to caring for this patient.  The goal is to palliate her symptoms, however she  understands is more likely that the radiotherapy will halt or slow down the growth of her cancer than make it go away in the region and it is less likely that her swelling will be alleviated nor is it very likely that she will regain function and sensation in her arm.  However, if she has a brisk response to treatment it is possible that she will have more palliation of her chronic symptoms.  We discussed the implications of chronic nerve damage and how sometimes this is irreversible.  I spent 25 minutes  face to face with  the patient and more than 50% of that time was spent in counseling and/or coordination of care.    __________________________________________   Eppie Gibson, MD \

## 2019-01-18 ENCOUNTER — Ambulatory Visit: Payer: Medicare HMO | Admitting: Adult Health

## 2019-01-20 ENCOUNTER — Ambulatory Visit
Admission: RE | Admit: 2019-01-20 | Discharge: 2019-01-20 | Disposition: A | Discharge status: Home / Self Care | Payer: Medicare HMO | Source: Ambulatory Visit | Attending: Radiation Oncology | Admitting: Radiation Oncology

## 2019-01-20 ENCOUNTER — Other Ambulatory Visit: Payer: Self-pay

## 2019-01-20 DIAGNOSIS — C50919 Malignant neoplasm of unspecified site of unspecified female breast: Secondary | ICD-10-CM

## 2019-01-20 MED ORDER — GADOBENATE DIMEGLUMINE 529 MG/ML IV SOLN
10.0000 mL | Freq: Once | INTRAVENOUS | Status: AC | PRN
  Administered 2019-01-20: 10 mL via INTRAVENOUS

## 2019-01-22 ENCOUNTER — Other Ambulatory Visit: Payer: Self-pay

## 2019-01-22 ENCOUNTER — Ambulatory Visit
Admission: RE | Admit: 2019-01-22 | Discharge: 2019-01-22 | Disposition: A | Discharge status: Home / Self Care | Payer: Medicare HMO | Source: Ambulatory Visit | Attending: Radiation Oncology | Admitting: Radiation Oncology

## 2019-01-22 DIAGNOSIS — Z170 Estrogen receptor positive status [ER+]: Secondary | ICD-10-CM | POA: Diagnosis not present

## 2019-01-22 DIAGNOSIS — Z8673 Personal history of transient ischemic attack (TIA), and cerebral infarction without residual deficits: Secondary | ICD-10-CM | POA: Diagnosis not present

## 2019-01-22 DIAGNOSIS — Z510 Encounter for antineoplastic radiation therapy: Secondary | ICD-10-CM | POA: Diagnosis not present

## 2019-01-22 DIAGNOSIS — C7951 Secondary malignant neoplasm of bone: Secondary | ICD-10-CM | POA: Diagnosis not present

## 2019-01-22 DIAGNOSIS — C787 Secondary malignant neoplasm of liver and intrahepatic bile duct: Secondary | ICD-10-CM | POA: Diagnosis not present

## 2019-01-22 DIAGNOSIS — Z9221 Personal history of antineoplastic chemotherapy: Secondary | ICD-10-CM | POA: Diagnosis not present

## 2019-01-22 DIAGNOSIS — C7800 Secondary malignant neoplasm of unspecified lung: Secondary | ICD-10-CM | POA: Diagnosis not present

## 2019-01-22 DIAGNOSIS — C773 Secondary and unspecified malignant neoplasm of axilla and upper limb lymph nodes: Secondary | ICD-10-CM

## 2019-01-22 DIAGNOSIS — C50911 Malignant neoplasm of unspecified site of right female breast: Secondary | ICD-10-CM | POA: Diagnosis not present

## 2019-01-22 NOTE — Addendum Note (Signed)
Encounter addended by: Eppie Gibson, MD on: 01/22/2019 6:25 PM  Actions taken: Pend clinical note, Clinical Note Signed

## 2019-01-22 NOTE — Progress Notes (Addendum)
  Radiation Oncology         [phone] ________________________________  Name: [name] [name] MRN: [id number]  Date: 01/22/2019  DOB: 1941-03-03  SIMULATION AND TREATMENT PLANNING NOTE Special Treatment Procedure Note:  Outpatient  DIAGNOSIS:     ICD-10-CM   1. Secondary malignant neoplasm of axillary node (HCC) C77.3     NARRATIVE:  The patient was brought to the [facility].  Identity was confirmed.  All relevant records and images related to the planned course of therapy were reviewed.  The patient freely provided informed written consent to proceed with treatment after reviewing the details related to the planned course of therapy. The consent form was witnessed and verified by the simulation staff.    Then, the patient was set-up in a stable reproducible supine position for radiation therapy.  CT images were obtained.  Surface markings were placed.  The CT images were loaded into the planning software.    TREATMENT PLANNING NOTE: Treatment planning then occurred.  The radiation prescription was entered and confirmed.    A total of 1 medically necessary complex treatment devices were fabricated and supervised by me, in the form of vaclock. MORE FIELDS WITH MLCs MAY BE ADDED IN DOSIMETRY for dose homogeneity.  I have requested : Intensity Modulated Radiotherapy (IMRT) is medically necessary for this case for the following reason:  Previous treatment to this area.   The patient will receive 30 Gy in 10 fractions to the right axilla and adjacent spinal canal.  Special Treatment Procedure Note: The patient received prior radiotherapy in her current fields and there will be overlap of radiation dose.  Prior regional radiotherapy increases the risk of side effects from treatment. I have considered this in the treatment planning process.  This increases the complexity of this patient's treatment and therefore this constitutes a special treatment procedure.  -----------------------------------  Eppie Gibson, MD

## 2019-01-24 DIAGNOSIS — Z170 Estrogen receptor positive status [ER+]: Secondary | ICD-10-CM | POA: Diagnosis not present

## 2019-01-24 DIAGNOSIS — C787 Secondary malignant neoplasm of liver and intrahepatic bile duct: Secondary | ICD-10-CM | POA: Diagnosis not present

## 2019-01-24 DIAGNOSIS — C7800 Secondary malignant neoplasm of unspecified lung: Secondary | ICD-10-CM | POA: Diagnosis not present

## 2019-01-24 DIAGNOSIS — Z510 Encounter for antineoplastic radiation therapy: Secondary | ICD-10-CM | POA: Diagnosis not present

## 2019-01-24 DIAGNOSIS — C7951 Secondary malignant neoplasm of bone: Secondary | ICD-10-CM | POA: Diagnosis not present

## 2019-01-24 DIAGNOSIS — C773 Secondary and unspecified malignant neoplasm of axilla and upper limb lymph nodes: Secondary | ICD-10-CM | POA: Diagnosis not present

## 2019-01-24 DIAGNOSIS — C50911 Malignant neoplasm of unspecified site of right female breast: Secondary | ICD-10-CM | POA: Diagnosis not present

## 2019-01-24 DIAGNOSIS — Z9221 Personal history of antineoplastic chemotherapy: Secondary | ICD-10-CM | POA: Diagnosis not present

## 2019-01-24 DIAGNOSIS — Z8673 Personal history of transient ischemic attack (TIA), and cerebral infarction without residual deficits: Secondary | ICD-10-CM | POA: Diagnosis not present

## 2019-01-25 ENCOUNTER — Ambulatory Visit
Admission: RE | Admit: 2019-01-25 | Discharge: 2019-01-25 | Disposition: A | Discharge status: Home / Self Care | Payer: Medicare HMO | Source: Ambulatory Visit | Attending: Radiation Oncology | Admitting: Radiation Oncology

## 2019-01-25 ENCOUNTER — Other Ambulatory Visit: Payer: Self-pay

## 2019-01-25 ENCOUNTER — Other Ambulatory Visit: Payer: Self-pay | Admitting: *Deleted

## 2019-01-25 DIAGNOSIS — C787 Secondary malignant neoplasm of liver and intrahepatic bile duct: Secondary | ICD-10-CM | POA: Diagnosis not present

## 2019-01-25 DIAGNOSIS — Z510 Encounter for antineoplastic radiation therapy: Secondary | ICD-10-CM | POA: Diagnosis not present

## 2019-01-25 DIAGNOSIS — C50911 Malignant neoplasm of unspecified site of right female breast: Secondary | ICD-10-CM | POA: Diagnosis not present

## 2019-01-25 DIAGNOSIS — C773 Secondary and unspecified malignant neoplasm of axilla and upper limb lymph nodes: Secondary | ICD-10-CM | POA: Diagnosis not present

## 2019-01-25 DIAGNOSIS — Z8673 Personal history of transient ischemic attack (TIA), and cerebral infarction without residual deficits: Secondary | ICD-10-CM | POA: Diagnosis not present

## 2019-01-25 DIAGNOSIS — C7951 Secondary malignant neoplasm of bone: Secondary | ICD-10-CM | POA: Diagnosis not present

## 2019-01-25 DIAGNOSIS — C7800 Secondary malignant neoplasm of unspecified lung: Secondary | ICD-10-CM | POA: Diagnosis not present

## 2019-01-25 DIAGNOSIS — Z9221 Personal history of antineoplastic chemotherapy: Secondary | ICD-10-CM | POA: Diagnosis not present

## 2019-01-25 DIAGNOSIS — Z170 Estrogen receptor positive status [ER+]: Secondary | ICD-10-CM | POA: Diagnosis not present

## 2019-01-25 NOTE — Patient Outreach (Signed)
[facility]) Care Management  01/25/2019   [name] [name] [birth year] [phone] RN Health Coach telephone call to patient.  [employer] compliance verified. Per patient she is doing good. Patient is having pain in rt arm with lymph edema. Patient starts her radiation therapy today. She has not been able to take her own blood pressure at home due to the the decrease movement and swelling and pain in arm only giving her one functioning arm. Patient friends brings her food and helps her get groceries. RN reiterated with patient about trying to get low sodium foods. Patient is moving in May to a handicap apartment. Patient has agreed to follow up outreach.  Current Medications:  Current Outpatient Medications  Medication Sig Dispense Refill  . acetaminophen (TYLENOL) 500 MG tablet Take 500 mg by mouth daily as needed (arm pain).    Marland Kitchen ALPRAZolam (XANAX) 0.5 MG tablet Take 1 tablet (0.5 mg total) by mouth 2 (two) times daily as needed for anxiety or sleep. (Patient taking differently: Take 0.5 mg by mouth daily as needed for anxiety or sleep. ) 60 tablet 3  . amLODipine (NORVASC) 10 MG tablet Take 1 tablet (10 mg total) by mouth daily. 30 tablet 11  . anastrozole (ARIMIDEX) 1 MG tablet TAKE 1 TABLET BY MOUTH ONCE DAILY 90 tablet 2  . apixaban (ELIQUIS) 5 MG TABS tablet Take 1 tablet (5 mg total) by mouth 2 (two) times daily. Take 10 mg bid for 7 days orally and then decrease to 5 mg bid orally thereafter. 60 tablet 5  . Cholecalciferol (VITAMIN D3) 1000 UNITS CAPS Take 1 capsule by mouth daily.     Marland Kitchen gabapentin (NEURONTIN) 100 MG capsule Take 1 capsule (100 mg total) by mouth at bedtime. 30 capsule 5  . hydrochlorothiazide (HYDRODIURIL) 25 MG tablet Take 25 mg by mouth daily.  3  . losartan (COZAAR) 100 MG tablet Take 100 mg by mouth daily.  3  . meclizine (ANTIVERT) 12.5 MG tablet Take 1 tablet (12.5 mg total) by mouth 2 (two) times daily as needed for dizziness. 10 tablet 0  . nystatin  (MYCOSTATIN/NYSTOP) powder Apply 1 Dose topically daily.  3  . palbociclib (IBRANCE) 100 MG capsule Take 1 capsule (100 mg total) by mouth daily with breakfast. Take whole with food. 21 capsule 6   No current facility-administered medications for this visit.    Facility-Administered Medications Ordered in Other Visits  Medication Dose Route Frequency Provider Last Rate Last Dose  . denosumab (XGEVA) injection 120 mg  120 mg Subcutaneous Once Curt Bears, MD        Functional Status:  In your present state of health, do you have any difficulty performing the following activities: 01/02/2019 11/30/2018  Hearing? N N  Vision? N N  Difficulty concentrating or making decisions? N Y  Walking or climbing stairs? N Y  Dressing or bathing? N Y  Doing errands, shopping? N Y  Conservation officer, nature and eating ? N Y  Using the Toilet? N Y  In the past six months, have you accidently leaked urine? N N  Do you have problems with loss of bowel control? N N  Managing your Medications? N N  Managing your Finances? N N  Housekeeping or managing your Housekeeping? N Y  Some recent data might be hidden    Fall/Depression Screening: Fall Risk  01/25/2019 01/02/2019 12/28/2018  Falls in the past year? _0 Comment - - -  Number falls in past yr:  _0 Injury with Fall? 0 0 0  Risk for fall due to : History of fall(s);Impaired balance/gait;Impaired mobility Impaired balance/gait;History of fall(s);Impaired mobility History of fall(s);Impaired balance/gait;Impaired mobility  Risk for fall due to: Comment - - -  Follow up Falls evaluation completed;Falls prevention discussed Education provided;Falls prevention discussed Falls evaluation completed;Falls prevention discussed   PHQ 2/9 Scores 01/25/2019 01/02/2019 12/28/2018 11/30/2018 07/21/2018 07/20/2018 06/13/2017  PHQ - 2 Score _1 PHQ- 9 Score 7 - 9 9 - 9 3   THN CM Care Plan Problem One     Most Recent Value  Care Plan Problem One  Knowledge  Deficit in Self Management of Hypertension  Role Documenting the Problem One  [place] for Problem One  Active  THN Long Term Goal   Patient will monitor blood pressure and document within the next 90 days   Interventions for Problem One Long Term Goal  RN reiterated monitoring BP. Patient is starting radiation therapy and vital signs will be checked. Patient expressed some problem getting the blood pressure cuff on due to the lymph edema and mainly use of one arm. RN will follow up with further discussion.  THN CM Short Term Goal #1   Patient will be able to identify foods that are low in sodium within the next 30 days  THN CM Short Term Goal #1 Start Date  01/25/19  Interventions for Short Term Goal #1  RN reiterated low sodium diet. Patient is trying to decrease the sodium and eat foods that she can open and fix. RN will follow up for further discussion and needs  THN CM Short Term Goal #3  Patient will verbalize speaking with social worker about assisted living facilities within the next 30 days  THN CM Short Term Goal #3 Met Date  01/25/19      Assessment:  Patient is having pain in arm with lymph edema and decrease movement Patient will be starting radiation for bone mets Patient and friend are reading food labels for sodium Per patient she has food and transportation Plan:  Patient will discuss with physician about pain medicaton RN reiterated low sodium diet Patient starts radiation today 01/25/2019 Patient will be moving to handicap accessible apartment by May 15th RN will follow up within the month of June  Malesha Suliman [facility] [phone]

## 2019-01-26 ENCOUNTER — Ambulatory Visit
Admission: RE | Admit: 2019-01-26 | Discharge: 2019-01-26 | Disposition: A | Discharge status: Home / Self Care | Payer: Medicare HMO | Source: Ambulatory Visit | Attending: Radiation Oncology | Admitting: Radiation Oncology

## 2019-01-26 ENCOUNTER — Other Ambulatory Visit: Payer: Self-pay

## 2019-01-26 DIAGNOSIS — C50911 Malignant neoplasm of unspecified site of right female breast: Secondary | ICD-10-CM | POA: Diagnosis not present

## 2019-01-26 DIAGNOSIS — Z170 Estrogen receptor positive status [ER+]: Secondary | ICD-10-CM | POA: Diagnosis not present

## 2019-01-26 DIAGNOSIS — C7800 Secondary malignant neoplasm of unspecified lung: Secondary | ICD-10-CM | POA: Diagnosis not present

## 2019-01-26 DIAGNOSIS — C7951 Secondary malignant neoplasm of bone: Secondary | ICD-10-CM | POA: Diagnosis not present

## 2019-01-26 DIAGNOSIS — Z8673 Personal history of transient ischemic attack (TIA), and cerebral infarction without residual deficits: Secondary | ICD-10-CM | POA: Diagnosis not present

## 2019-01-26 DIAGNOSIS — Z510 Encounter for antineoplastic radiation therapy: Secondary | ICD-10-CM | POA: Diagnosis not present

## 2019-01-26 DIAGNOSIS — Z9221 Personal history of antineoplastic chemotherapy: Secondary | ICD-10-CM | POA: Diagnosis not present

## 2019-01-26 DIAGNOSIS — C787 Secondary malignant neoplasm of liver and intrahepatic bile duct: Secondary | ICD-10-CM | POA: Diagnosis not present

## 2019-01-26 DIAGNOSIS — C773 Secondary and unspecified malignant neoplasm of axilla and upper limb lymph nodes: Secondary | ICD-10-CM | POA: Diagnosis not present

## 2019-01-29 ENCOUNTER — Ambulatory Visit
Admission: RE | Admit: 2019-01-29 | Discharge: 2019-01-29 | Disposition: A | Discharge status: Home / Self Care | Payer: Medicare HMO | Source: Ambulatory Visit | Attending: Radiation Oncology | Admitting: Radiation Oncology

## 2019-01-29 ENCOUNTER — Other Ambulatory Visit: Payer: Self-pay

## 2019-01-29 DIAGNOSIS — Z170 Estrogen receptor positive status [ER+]: Secondary | ICD-10-CM | POA: Diagnosis not present

## 2019-01-29 DIAGNOSIS — Z9221 Personal history of antineoplastic chemotherapy: Secondary | ICD-10-CM | POA: Diagnosis not present

## 2019-01-29 DIAGNOSIS — C787 Secondary malignant neoplasm of liver and intrahepatic bile duct: Secondary | ICD-10-CM | POA: Diagnosis not present

## 2019-01-29 DIAGNOSIS — C7800 Secondary malignant neoplasm of unspecified lung: Secondary | ICD-10-CM | POA: Diagnosis not present

## 2019-01-29 DIAGNOSIS — Z8673 Personal history of transient ischemic attack (TIA), and cerebral infarction without residual deficits: Secondary | ICD-10-CM | POA: Diagnosis not present

## 2019-01-29 DIAGNOSIS — Z510 Encounter for antineoplastic radiation therapy: Secondary | ICD-10-CM | POA: Diagnosis not present

## 2019-01-29 DIAGNOSIS — C7951 Secondary malignant neoplasm of bone: Secondary | ICD-10-CM | POA: Diagnosis not present

## 2019-01-29 DIAGNOSIS — C50911 Malignant neoplasm of unspecified site of right female breast: Secondary | ICD-10-CM | POA: Diagnosis not present

## 2019-01-29 DIAGNOSIS — C773 Secondary and unspecified malignant neoplasm of axilla and upper limb lymph nodes: Secondary | ICD-10-CM | POA: Diagnosis not present

## 2019-01-30 ENCOUNTER — Other Ambulatory Visit: Payer: Self-pay

## 2019-01-30 ENCOUNTER — Ambulatory Visit
Admission: RE | Admit: 2019-01-30 | Discharge: 2019-01-30 | Disposition: A | Discharge status: Home / Self Care | Payer: Medicare HMO | Source: Ambulatory Visit | Attending: Radiation Oncology | Admitting: Radiation Oncology

## 2019-01-30 DIAGNOSIS — C7800 Secondary malignant neoplasm of unspecified lung: Secondary | ICD-10-CM | POA: Diagnosis not present

## 2019-01-30 DIAGNOSIS — C787 Secondary malignant neoplasm of liver and intrahepatic bile duct: Secondary | ICD-10-CM | POA: Diagnosis not present

## 2019-01-30 DIAGNOSIS — Z8673 Personal history of transient ischemic attack (TIA), and cerebral infarction without residual deficits: Secondary | ICD-10-CM | POA: Diagnosis not present

## 2019-01-30 DIAGNOSIS — Z170 Estrogen receptor positive status [ER+]: Secondary | ICD-10-CM | POA: Diagnosis not present

## 2019-01-30 DIAGNOSIS — C50911 Malignant neoplasm of unspecified site of right female breast: Secondary | ICD-10-CM | POA: Diagnosis not present

## 2019-01-30 DIAGNOSIS — Z9221 Personal history of antineoplastic chemotherapy: Secondary | ICD-10-CM | POA: Diagnosis not present

## 2019-01-30 DIAGNOSIS — C773 Secondary and unspecified malignant neoplasm of axilla and upper limb lymph nodes: Secondary | ICD-10-CM | POA: Diagnosis not present

## 2019-01-30 DIAGNOSIS — C7951 Secondary malignant neoplasm of bone: Secondary | ICD-10-CM | POA: Diagnosis not present

## 2019-01-30 DIAGNOSIS — Z510 Encounter for antineoplastic radiation therapy: Secondary | ICD-10-CM | POA: Diagnosis not present

## 2019-01-31 ENCOUNTER — Other Ambulatory Visit: Payer: Self-pay

## 2019-01-31 ENCOUNTER — Ambulatory Visit
Admission: RE | Admit: 2019-01-31 | Discharge: 2019-01-31 | Disposition: A | Discharge status: Home / Self Care | Payer: Medicare HMO | Source: Ambulatory Visit | Attending: Radiation Oncology | Admitting: Radiation Oncology

## 2019-01-31 DIAGNOSIS — Z8673 Personal history of transient ischemic attack (TIA), and cerebral infarction without residual deficits: Secondary | ICD-10-CM | POA: Insufficient documentation

## 2019-01-31 DIAGNOSIS — C787 Secondary malignant neoplasm of liver and intrahepatic bile duct: Secondary | ICD-10-CM | POA: Insufficient documentation

## 2019-01-31 DIAGNOSIS — Z170 Estrogen receptor positive status [ER+]: Secondary | ICD-10-CM | POA: Diagnosis not present

## 2019-01-31 DIAGNOSIS — Z7901 Long term (current) use of anticoagulants: Secondary | ICD-10-CM | POA: Insufficient documentation

## 2019-01-31 DIAGNOSIS — C7951 Secondary malignant neoplasm of bone: Secondary | ICD-10-CM | POA: Diagnosis not present

## 2019-01-31 DIAGNOSIS — Z86718 Personal history of other venous thrombosis and embolism: Secondary | ICD-10-CM | POA: Insufficient documentation

## 2019-01-31 DIAGNOSIS — Z9221 Personal history of antineoplastic chemotherapy: Secondary | ICD-10-CM | POA: Diagnosis not present

## 2019-01-31 DIAGNOSIS — Z79811 Long term (current) use of aromatase inhibitors: Secondary | ICD-10-CM | POA: Insufficient documentation

## 2019-01-31 DIAGNOSIS — I10 Essential (primary) hypertension: Secondary | ICD-10-CM | POA: Insufficient documentation

## 2019-01-31 DIAGNOSIS — Z510 Encounter for antineoplastic radiation therapy: Secondary | ICD-10-CM | POA: Insufficient documentation

## 2019-01-31 DIAGNOSIS — C7800 Secondary malignant neoplasm of unspecified lung: Secondary | ICD-10-CM | POA: Insufficient documentation

## 2019-01-31 DIAGNOSIS — Z79899 Other long term (current) drug therapy: Secondary | ICD-10-CM | POA: Diagnosis not present

## 2019-01-31 DIAGNOSIS — Z96641 Presence of right artificial hip joint: Secondary | ICD-10-CM | POA: Diagnosis not present

## 2019-01-31 DIAGNOSIS — C773 Secondary and unspecified malignant neoplasm of axilla and upper limb lymph nodes: Secondary | ICD-10-CM | POA: Diagnosis not present

## 2019-01-31 DIAGNOSIS — Z87891 Personal history of nicotine dependence: Secondary | ICD-10-CM | POA: Insufficient documentation

## 2019-01-31 DIAGNOSIS — C50911 Malignant neoplasm of unspecified site of right female breast: Secondary | ICD-10-CM | POA: Diagnosis not present

## 2019-01-31 DIAGNOSIS — E785 Hyperlipidemia, unspecified: Secondary | ICD-10-CM | POA: Diagnosis not present

## 2019-01-31 DIAGNOSIS — F419 Anxiety disorder, unspecified: Secondary | ICD-10-CM | POA: Diagnosis not present

## 2019-02-01 ENCOUNTER — Ambulatory Visit
Admission: RE | Admit: 2019-02-01 | Discharge: 2019-02-01 | Disposition: A | Discharge status: Home / Self Care | Payer: Medicare HMO | Source: Ambulatory Visit | Attending: Radiation Oncology | Admitting: Radiation Oncology

## 2019-02-01 ENCOUNTER — Other Ambulatory Visit: Payer: Self-pay

## 2019-02-01 DIAGNOSIS — C773 Secondary and unspecified malignant neoplasm of axilla and upper limb lymph nodes: Secondary | ICD-10-CM | POA: Diagnosis not present

## 2019-02-01 DIAGNOSIS — C7951 Secondary malignant neoplasm of bone: Secondary | ICD-10-CM | POA: Diagnosis not present

## 2019-02-01 DIAGNOSIS — Z9221 Personal history of antineoplastic chemotherapy: Secondary | ICD-10-CM | POA: Diagnosis not present

## 2019-02-01 DIAGNOSIS — C787 Secondary malignant neoplasm of liver and intrahepatic bile duct: Secondary | ICD-10-CM | POA: Diagnosis not present

## 2019-02-01 DIAGNOSIS — C50911 Malignant neoplasm of unspecified site of right female breast: Secondary | ICD-10-CM | POA: Diagnosis not present

## 2019-02-01 DIAGNOSIS — Z170 Estrogen receptor positive status [ER+]: Secondary | ICD-10-CM | POA: Diagnosis not present

## 2019-02-01 DIAGNOSIS — Z510 Encounter for antineoplastic radiation therapy: Secondary | ICD-10-CM | POA: Diagnosis not present

## 2019-02-01 DIAGNOSIS — C7800 Secondary malignant neoplasm of unspecified lung: Secondary | ICD-10-CM | POA: Diagnosis not present

## 2019-02-01 DIAGNOSIS — Z8673 Personal history of transient ischemic attack (TIA), and cerebral infarction without residual deficits: Secondary | ICD-10-CM | POA: Diagnosis not present

## 2019-02-02 ENCOUNTER — Ambulatory Visit
Admission: RE | Admit: 2019-02-02 | Discharge: 2019-02-02 | Disposition: A | Discharge status: Home / Self Care | Payer: Medicare HMO | Source: Ambulatory Visit | Attending: Radiation Oncology | Admitting: Radiation Oncology

## 2019-02-02 ENCOUNTER — Other Ambulatory Visit: Payer: Self-pay

## 2019-02-02 DIAGNOSIS — Z510 Encounter for antineoplastic radiation therapy: Secondary | ICD-10-CM | POA: Diagnosis not present

## 2019-02-02 DIAGNOSIS — C7800 Secondary malignant neoplasm of unspecified lung: Secondary | ICD-10-CM | POA: Diagnosis not present

## 2019-02-02 DIAGNOSIS — C7951 Secondary malignant neoplasm of bone: Secondary | ICD-10-CM | POA: Diagnosis not present

## 2019-02-02 DIAGNOSIS — Z9221 Personal history of antineoplastic chemotherapy: Secondary | ICD-10-CM | POA: Diagnosis not present

## 2019-02-02 DIAGNOSIS — Z170 Estrogen receptor positive status [ER+]: Secondary | ICD-10-CM | POA: Diagnosis not present

## 2019-02-02 DIAGNOSIS — C50911 Malignant neoplasm of unspecified site of right female breast: Secondary | ICD-10-CM | POA: Diagnosis not present

## 2019-02-02 DIAGNOSIS — C787 Secondary malignant neoplasm of liver and intrahepatic bile duct: Secondary | ICD-10-CM | POA: Diagnosis not present

## 2019-02-02 DIAGNOSIS — C773 Secondary and unspecified malignant neoplasm of axilla and upper limb lymph nodes: Secondary | ICD-10-CM | POA: Diagnosis not present

## 2019-02-02 DIAGNOSIS — Z8673 Personal history of transient ischemic attack (TIA), and cerebral infarction without residual deficits: Secondary | ICD-10-CM | POA: Diagnosis not present

## 2019-02-05 ENCOUNTER — Ambulatory Visit
Admission: RE | Admit: 2019-02-05 | Discharge: 2019-02-05 | Disposition: A | Discharge status: Home / Self Care | Payer: Medicare HMO | Source: Ambulatory Visit | Attending: Radiation Oncology | Admitting: Radiation Oncology

## 2019-02-05 ENCOUNTER — Other Ambulatory Visit: Payer: Self-pay

## 2019-02-05 DIAGNOSIS — C50911 Malignant neoplasm of unspecified site of right female breast: Secondary | ICD-10-CM | POA: Diagnosis not present

## 2019-02-05 DIAGNOSIS — C7800 Secondary malignant neoplasm of unspecified lung: Secondary | ICD-10-CM | POA: Diagnosis not present

## 2019-02-05 DIAGNOSIS — C787 Secondary malignant neoplasm of liver and intrahepatic bile duct: Secondary | ICD-10-CM | POA: Diagnosis not present

## 2019-02-05 DIAGNOSIS — C773 Secondary and unspecified malignant neoplasm of axilla and upper limb lymph nodes: Secondary | ICD-10-CM | POA: Diagnosis not present

## 2019-02-05 DIAGNOSIS — Z170 Estrogen receptor positive status [ER+]: Secondary | ICD-10-CM | POA: Diagnosis not present

## 2019-02-05 DIAGNOSIS — C7951 Secondary malignant neoplasm of bone: Secondary | ICD-10-CM | POA: Diagnosis not present

## 2019-02-05 DIAGNOSIS — Z9221 Personal history of antineoplastic chemotherapy: Secondary | ICD-10-CM | POA: Diagnosis not present

## 2019-02-05 DIAGNOSIS — Z510 Encounter for antineoplastic radiation therapy: Secondary | ICD-10-CM | POA: Diagnosis not present

## 2019-02-05 DIAGNOSIS — Z8673 Personal history of transient ischemic attack (TIA), and cerebral infarction without residual deficits: Secondary | ICD-10-CM | POA: Diagnosis not present

## 2019-02-06 ENCOUNTER — Telehealth: Payer: Self-pay | Admitting: *Deleted

## 2019-02-06 ENCOUNTER — Other Ambulatory Visit: Payer: Self-pay

## 2019-02-06 ENCOUNTER — Ambulatory Visit
Admission: RE | Admit: 2019-02-06 | Discharge: 2019-02-06 | Disposition: A | Discharge status: Home / Self Care | Payer: Medicare HMO | Source: Ambulatory Visit | Attending: Radiation Oncology | Admitting: Radiation Oncology

## 2019-02-06 DIAGNOSIS — Z9221 Personal history of antineoplastic chemotherapy: Secondary | ICD-10-CM | POA: Diagnosis not present

## 2019-02-06 DIAGNOSIS — C50911 Malignant neoplasm of unspecified site of right female breast: Secondary | ICD-10-CM | POA: Diagnosis not present

## 2019-02-06 DIAGNOSIS — Z170 Estrogen receptor positive status [ER+]: Secondary | ICD-10-CM | POA: Diagnosis not present

## 2019-02-06 DIAGNOSIS — Z8673 Personal history of transient ischemic attack (TIA), and cerebral infarction without residual deficits: Secondary | ICD-10-CM | POA: Diagnosis not present

## 2019-02-06 DIAGNOSIS — Z510 Encounter for antineoplastic radiation therapy: Secondary | ICD-10-CM | POA: Diagnosis not present

## 2019-02-06 DIAGNOSIS — C7800 Secondary malignant neoplasm of unspecified lung: Secondary | ICD-10-CM | POA: Diagnosis not present

## 2019-02-06 DIAGNOSIS — C787 Secondary malignant neoplasm of liver and intrahepatic bile duct: Secondary | ICD-10-CM | POA: Diagnosis not present

## 2019-02-06 DIAGNOSIS — C773 Secondary and unspecified malignant neoplasm of axilla and upper limb lymph nodes: Secondary | ICD-10-CM | POA: Diagnosis not present

## 2019-02-06 DIAGNOSIS — C7951 Secondary malignant neoplasm of bone: Secondary | ICD-10-CM | POA: Diagnosis not present

## 2019-02-06 NOTE — Telephone Encounter (Signed)
Called pt to inform her that per Dr. Lindi Adie and Dr. Isidore Moos, pt can re start her ibrance on thursday 02/08/2019.  Pt verbalized understanding.

## 2019-02-07 ENCOUNTER — Ambulatory Visit
Admission: RE | Admit: 2019-02-07 | Discharge: 2019-02-07 | Disposition: A | Discharge status: Home / Self Care | Payer: Medicare HMO | Source: Ambulatory Visit | Attending: Radiation Oncology | Admitting: Radiation Oncology

## 2019-02-07 ENCOUNTER — Other Ambulatory Visit: Payer: Self-pay

## 2019-02-07 ENCOUNTER — Encounter: Payer: Self-pay | Admitting: Radiation Oncology

## 2019-02-07 DIAGNOSIS — C787 Secondary malignant neoplasm of liver and intrahepatic bile duct: Secondary | ICD-10-CM | POA: Diagnosis not present

## 2019-02-07 DIAGNOSIS — Z8673 Personal history of transient ischemic attack (TIA), and cerebral infarction without residual deficits: Secondary | ICD-10-CM | POA: Diagnosis not present

## 2019-02-07 DIAGNOSIS — Z510 Encounter for antineoplastic radiation therapy: Secondary | ICD-10-CM | POA: Diagnosis not present

## 2019-02-07 DIAGNOSIS — C7800 Secondary malignant neoplasm of unspecified lung: Secondary | ICD-10-CM | POA: Diagnosis not present

## 2019-02-07 DIAGNOSIS — Z9221 Personal history of antineoplastic chemotherapy: Secondary | ICD-10-CM | POA: Diagnosis not present

## 2019-02-07 DIAGNOSIS — C773 Secondary and unspecified malignant neoplasm of axilla and upper limb lymph nodes: Secondary | ICD-10-CM | POA: Diagnosis not present

## 2019-02-07 DIAGNOSIS — Z170 Estrogen receptor positive status [ER+]: Secondary | ICD-10-CM | POA: Diagnosis not present

## 2019-02-07 DIAGNOSIS — C7951 Secondary malignant neoplasm of bone: Secondary | ICD-10-CM | POA: Diagnosis not present

## 2019-02-07 DIAGNOSIS — C50911 Malignant neoplasm of unspecified site of right female breast: Secondary | ICD-10-CM | POA: Diagnosis not present

## 2019-02-09 ENCOUNTER — Telehealth: Payer: Self-pay | Admitting: *Deleted

## 2019-02-09 NOTE — Telephone Encounter (Signed)
Pt informed tech via phone call that she had a sore throat when Covid assessment done.  She states that she has no other symptoms except an ocaisional cough which is more like she gets strangled when drinking.  She states that driver had all windows down when she was here last & may have caught a cold.  She denies fever.  Discussed tylenol for sore throat & warm salt water gargles.  She is drinking hot teat.  Informed that assessments will be done on Monday but not to come if she has new or worsening symptoms but to call.  She expressed understanding.

## 2019-02-12 ENCOUNTER — Inpatient Hospital Stay: Payer: Medicare HMO | Attending: Oncology

## 2019-02-12 ENCOUNTER — Other Ambulatory Visit: Payer: Self-pay

## 2019-02-12 VITALS — BP 151/63 | HR 96 | Temp 98.3°F | Resp 18

## 2019-02-12 DIAGNOSIS — Z95828 Presence of other vascular implants and grafts: Secondary | ICD-10-CM

## 2019-02-12 DIAGNOSIS — Z170 Estrogen receptor positive status [ER+]: Secondary | ICD-10-CM | POA: Insufficient documentation

## 2019-02-12 DIAGNOSIS — Z79818 Long term (current) use of other agents affecting estrogen receptors and estrogen levels: Secondary | ICD-10-CM | POA: Diagnosis not present

## 2019-02-12 DIAGNOSIS — C7951 Secondary malignant neoplasm of bone: Secondary | ICD-10-CM

## 2019-02-12 DIAGNOSIS — C50911 Malignant neoplasm of unspecified site of right female breast: Secondary | ICD-10-CM | POA: Insufficient documentation

## 2019-02-12 MED ORDER — FULVESTRANT 250 MG/5ML IM SOLN
500.0000 mg | Freq: Once | INTRAMUSCULAR | Status: AC
  Administered 2019-02-12: 500 mg via INTRAMUSCULAR

## 2019-02-12 MED ORDER — FULVESTRANT 250 MG/5ML IM SOLN
INTRAMUSCULAR | Status: AC
  Filled 2019-02-12: qty 10

## 2019-02-12 NOTE — Patient Instructions (Signed)

## 2019-03-02 MED FILL — IBRANCE 100 MG CAPSULE: 100 | 28 days supply | Qty: 21 | Fill #2

## 2019-03-02 MED FILL — GABAPENTIN 100 MG CAPSULE: 100 | 30 days supply | Qty: 30 | Fill #0

## 2019-03-02 MED FILL — ELIQUIS 5 MG TABLET: 5 | 30 days supply | Qty: 60 | Fill #0

## 2019-03-05 ENCOUNTER — Other Ambulatory Visit: Payer: Self-pay | Admitting: Radiation Oncology

## 2019-03-07 ENCOUNTER — Telehealth: Payer: Self-pay | Admitting: Hematology and Oncology

## 2019-03-07 NOTE — Progress Notes (Signed)
I called the patient today about their upcoming follow-up appointment in radiation oncology.   Given concerns about the COVID-19 pandemic, I offered a phone assessment with the patient to determine if coming to the clinic was necessary. The patient accepted.  I let the patient know that I had spoken with Dr. Isidore Moos, and she wanted them to know the importance of washing their hands for at least 20 seconds at a time, especially after going out in public, and before they eat. Limit going out in public whenever possible. Do not touch your face, unless your hands are clean, such as when bathing. Get plenty of rest, eat well, and stay hydrated.  Symptomatically, the patient is doing relatively well. They report decreased pain to her right shoulder. She does continue to report numbness to her right hand.   All questions were answered to the patient's satisfaction.  I encouraged the patient to call with any further questions. Otherwise, the plan is to have her Zometa infusion on 03/13/19 and follow up with Dr. Lindi Adie.  She will see Dr. Isidore Moos as needed.   Patient is pleased with this plan, and we will cancel their upcoming follow-up to reduce the risk of COVID-19 transmission.

## 2019-03-07 NOTE — Telephone Encounter (Signed)
Added MD appt to 5/12 per sch msg. Called and spoke with patient to inform of changes. Confirmed new time

## 2019-03-09 ENCOUNTER — Ambulatory Visit
Admission: RE | Admit: 2019-03-09 | Discharge: 2019-03-09 | Disposition: A | Discharge status: Home / Self Care | Payer: Medicare HMO | Source: Ambulatory Visit | Attending: Radiation Oncology | Admitting: Radiation Oncology

## 2019-03-09 NOTE — Assessment & Plan Note (Signed)
Metastatic breast cancer with bone metastases along with lung and liver metastases, progressed on Ibrance with anastrozole.  Radiology review: 05/23/2018 CT CAP:New liver metastases 3.1 cm,increase in lung nodules, increase in sclerotic lesion of T11 and T9and S1 vertebral body. Liver biopsy 06/19/2018: Metastatic breast cancer ER 100%, PR 0%, Ki-67 50%, HER-2 negative BRCA1 mutation: Negative  PDL 1: 0%and foundation 1:PI 3 kinase mutation was identified ----------------------------------------------------------------------------------------------------------------------------------------------------------------------- Current treatment: Faslodex with Ibrance started8/29/2019  Patient's insurance rejected Xgeva.OnZometa every 3 months.   Palliative radiation: 01/25/2019-02/07/2019 Resumed Ibrance with Faslodex.  I would like to perform PET CT scan to evaluate if there is progression of disease systemically. If there is worsening disease and we will have to switch her to a different therapy, most likely with Alpelisib.  Return to clinic in 1 month after scans.

## 2019-03-12 NOTE — Progress Notes (Signed)
Patient Care Team: [name], [name], MD as PCP - General (Internal Medicine) Curt Bears, MD (Hematology and Oncology) Paralee Cancel, MD (Orthopedic Surgery) Tyler Pita, MD (Radiation Oncology) Nicholas Lose, MD as Consulting Physician (Hematology and Oncology) Pleasant, Eppie Gibson, RN as [facility]  DIAGNOSIS:    ICD-10-CM   1. Breast cancer metastasized to bone, right ([place]) C50.911    C79.51     SUMMARY OF ONCOLOGIC HISTORY:   Breast cancer metastasized to bone, right ([place])   05/22/2001 Initial Diagnosis    Rt breast: stage IIb (T2, N1, M0) invasive right breast carcinoma with positive estrogen and progesterone receptor as well as HER-2/neu    06/12/2001 Surgery    right lumpectomy with right axillary lymph node dissection revealing 4/9 lymph nodes that were positive for malignancy.     08/12/2001 - 01/10/2002 Chemotherapy    Adj chemo CMF X 8     05/07/2002 - 12/14/2007 Anti-estrogen oral therapy    Adj Tamoxifen for 2 years and then switched to Letrozole (by Dr.Khan in Aug 2005), stopped Feb 2009    02/11/2003 - 04/05/2003 Radiation Therapy    Adj XRT by Tammi Klippel    03/18/2011 -  Anti-estrogen oral therapy    Anastrozole 1 mg daily    04/01/2011 Surgery    Left total hip replacement secondary to pathologic left femoral neck fracture    04/25/2017 Relapse/Recurrence    Anastrozole with Ibrance discontinued 05/30/2018 due to disease progression, Xgeva for bone metastases every 3 months    06/19/2018 Procedure    Liver biopsy: Metastatic breast cancer, ER 100%, PR 0%, Ki-67 50%, HER-2 negative    07/06/2018 Miscellaneous    Foundation 1 results: Microsatellite stable, tumor mutational burden 15 mutations per MB, ATRX, CCND1, CDK4 amplification, ERBB4 amplification, ESR1 amplification, FGFR 1, MDM2 amplification, MEM1, NST3 amplification, PIK3CA mutation, ZNF703 amplification; PDL 1: 0%    07/06/2018 -  Anti-estrogen oral therapy   Ibrance with Faslodex    01/26/2019 - 02/07/2019 Radiation Therapy    Palliative radiation to the neck and subclavian area     CHIEF COMPLIANT: Follow-up on Faslodex and Ibrancewith Zometa  INTERVAL HISTORY: [name] [name] is a 78 y.o. with above-mentioned history of metastatic breast cancer with metastases to the liver, lung, and bones currently on Faslodex with Ibrance.She underwent palliative radiation to the neck and subclavian area from 01/26/19-02/07/19. She presents to the clinic today for follow-up.  She continues to have numbness in the right arm although it is slightly better than before.  She is using a walker for ambulation.  REVIEW OF SYSTEMS:   Constitutional: Denies fevers, chills or abnormal weight loss Eyes: Denies blurriness of vision Ears, nose, mouth, throat, and face: Denies mucositis or sore throat Respiratory: Denies cough, dyspnea or wheezes Cardiovascular: Denies palpitation, chest discomfort Gastrointestinal: Denies nausea, heartburn or change in bowel habits Skin: Denies abnormal skin rashes Lymphatics: Denies new lymphadenopathy or easy bruising Neurological: Numbness in the right arm, weakness resulting in using a walker for ambulation. Behavioral/Psych: Mood is stable, no new changes  Extremities: No lower extremity edema Breast: denies any pain or lumps or nodules in either breasts All other systems were reviewed with the patient and are negative.  I have reviewed the past medical history, past surgical history, social history and family history with the patient and they are unchanged from previous note.  ALLERGIES:  has No Known Allergies.  MEDICATIONS:  Current Outpatient Medications  Medication Sig Dispense Refill  .  acetaminophen (TYLENOL) 500 MG tablet Take 500 mg by mouth daily as needed (arm pain).    Marland Kitchen ALPRAZolam (XANAX) 0.5 MG tablet Take 1 tablet (0.5 mg total) by mouth 2 (two) times daily as needed for anxiety or sleep. (Patient taking  differently: Take 0.5 mg by mouth daily as needed for anxiety or sleep. ) 60 tablet 3  . amLODipine (NORVASC) 10 MG tablet Take 1 tablet (10 mg total) by mouth daily. 30 tablet 11  . anastrozole (ARIMIDEX) 1 MG tablet TAKE 1 TABLET BY MOUTH ONCE DAILY 90 tablet 2  . apixaban (ELIQUIS) 5 MG TABS tablet Take 1 tablet (5 mg total) by mouth 2 (two) times daily. Take 10 mg bid for 7 days orally and then decrease to 5 mg bid orally thereafter. 60 tablet 5  . Cholecalciferol (VITAMIN D3) 1000 UNITS CAPS Take 1 capsule by mouth daily.     Marland Kitchen gabapentin (NEURONTIN) 100 MG capsule Take 1 capsule (100 mg total) by mouth at bedtime. 30 capsule 5  . hydrochlorothiazide (HYDRODIURIL) 25 MG tablet Take 25 mg by mouth daily.  3  . losartan (COZAAR) 100 MG tablet Take 100 mg by mouth daily.  3  . meclizine (ANTIVERT) 12.5 MG tablet Take 1 tablet (12.5 mg total) by mouth 2 (two) times daily as needed for dizziness. 10 tablet 0  . nystatin (MYCOSTATIN/NYSTOP) powder Apply 1 Dose topically daily.  3  . palbociclib (IBRANCE) 100 MG capsule Take 1 capsule (100 mg total) by mouth daily with breakfast. Take whole with food. 21 capsule 6   No current facility-administered medications for this visit.    Facility-Administered Medications Ordered in Other Visits  Medication Dose Route Frequency Provider Last Rate Last Dose  . denosumab (XGEVA) injection 120 mg  120 mg Subcutaneous Once Curt Bears, MD        PHYSICAL EXAMINATION: ECOG PERFORMANCE STATUS: 1 - Symptomatic but completely ambulatory  Vitals:   03/13/19 1355  BP: (!) 152/67  Pulse: 88  Resp: 17  Temp: 98 F (36.7 C)  SpO2: 100%   Filed Weights   03/13/19 1355  Weight: 193 lb 14.4 oz (88 kg)    GENERAL: alert, no distress and comfortable SKIN: skin color, texture, turgor are normal, no rashes or significant lesions EYES: normal, Conjunctiva are pink and non-injected, sclera clear OROPHARYNX: no exudate, no erythema and lips, buccal mucosa,  and tongue normal  NECK: supple, thyroid normal size, non-tender, without nodularity LYMPH: no palpable lymphadenopathy in the cervical, axillary or inguinal LUNGS: clear to auscultation and percussion with normal breathing effort HEART: regular rate & rhythm and no murmurs and no lower extremity edema ABDOMEN: abdomen soft, non-tender and normal bowel sounds MUSCULOSKELETAL: no cyanosis of digits and no clubbing  NEURO: alert & oriented x 3 with fluent speech, no focal motor/sensory deficits EXTREMITIES: No lower extremity edema  LABORATORY DATA:  I have reviewed the data as listed CMP Latest Ref Rng & Units 03/13/2019 12/20/2018 12/13/2018  Glucose 70 - 99 mg/dL 103(H) 98 101(H)  BUN 8 - 23 mg/dL 24(H) 17 24(H)  Creatinine 0.44 - 1.00 mg/dL 1.96(H) 1.90(H) 2.15(H)  Sodium 135 - 145 mmol/L 137 140 140  Potassium 3.5 - 5.1 mmol/L 3.6 3.9 3.7  Chloride 98 - 111 mmol/L 104 106 105  CO2 22 - 32 mmol/L _0 Calcium 8.9 - 10.3 mg/dL 9.6 8.9 9.8  Total Protein 6.5 - 8.1 g/dL 8.1 7.8 -  Total Bilirubin 0.3 - 1.2 mg/dL 0.4  0.3 -  Alkaline Phos 38 - 126 U/L 79 73 -  AST 15 - 41 U/L 13(L) 19 -  ALT 0 - 44 U/L 8 7 -    Lab Results  Component Value Date   WBC 2.8 (L) 03/13/2019   HGB 9.0 (L) 03/13/2019   HCT 28.2 (L) 03/13/2019   MCV 108.9 (H) 03/13/2019   PLT 130 (L) 03/13/2019   NEUTROABS 1.6 (L) 03/13/2019    ASSESSMENT & PLAN:  Breast cancer metastasized to bone, right ([facility]) Metastatic breast cancer with bone metastases along with lung and liver metastases, progressed on Ibrance with anastrozole.  Radiology review: 05/23/2018 CT CAP:New liver metastases 3.1 cm,increase in lung nodules, increase in sclerotic lesion of T11 and T9and S1 vertebral body. Liver biopsy 06/19/2018: Metastatic breast cancer ER 100%, PR 0%, Ki-67 50%, HER-2 negative BRCA1 mutation: Negative  PDL 1: 0%and foundation 1:PI 3 kinase mutation was identified  ----------------------------------------------------------------------------------------------------------------------------------------------------------------------- Current treatment: Faslodex with Ibrance started8/29/2019  Patient's insurance rejected Xgeva.OnZometa every 3 months. Today she receives Zometa.  Palliative radiation: 01/25/2019-02/07/2019 Resumed Ibrance with Faslodex. I reviewed today's blood work.  She has ANC of 1.6.  I would like to perform PET CT scan to evaluate if there is progression of disease systemically. If there is worsening disease and we will have to switch her to a different therapy, most likely with Alpelisib.  Return to clinic in 1 month after scans.    No orders of the defined types were placed in this encounter.  The patient has a good understanding of the overall plan. she agrees with it. she will call with any problems that may develop before the next visit here.  Nicholas Lose, MD 03/13/2019  Julious Oka Dorshimer am acting as scribe for Dr. Nicholas Lose.  I have reviewed the above documentation for accuracy and completeness, and I agree with the above.

## 2019-03-13 ENCOUNTER — Other Ambulatory Visit: Payer: Self-pay

## 2019-03-13 ENCOUNTER — Ambulatory Visit: Payer: Medicare HMO

## 2019-03-13 ENCOUNTER — Inpatient Hospital Stay: Payer: Medicare HMO | Attending: Oncology

## 2019-03-13 ENCOUNTER — Inpatient Hospital Stay: Payer: Medicare HMO

## 2019-03-13 ENCOUNTER — Inpatient Hospital Stay (HOSPITAL_BASED_OUTPATIENT_CLINIC_OR_DEPARTMENT_OTHER): Payer: Medicare HMO | Admitting: Hematology and Oncology

## 2019-03-13 DIAGNOSIS — Z923 Personal history of irradiation: Secondary | ICD-10-CM

## 2019-03-13 DIAGNOSIS — C50911 Malignant neoplasm of unspecified site of right female breast: Secondary | ICD-10-CM

## 2019-03-13 DIAGNOSIS — Z7901 Long term (current) use of anticoagulants: Secondary | ICD-10-CM

## 2019-03-13 DIAGNOSIS — Z95828 Presence of other vascular implants and grafts: Secondary | ICD-10-CM

## 2019-03-13 DIAGNOSIS — C7951 Secondary malignant neoplasm of bone: Secondary | ICD-10-CM | POA: Diagnosis not present

## 2019-03-13 DIAGNOSIS — R200 Anesthesia of skin: Secondary | ICD-10-CM | POA: Insufficient documentation

## 2019-03-13 DIAGNOSIS — Z79811 Long term (current) use of aromatase inhibitors: Secondary | ICD-10-CM

## 2019-03-13 DIAGNOSIS — Z9221 Personal history of antineoplastic chemotherapy: Secondary | ICD-10-CM | POA: Insufficient documentation

## 2019-03-13 DIAGNOSIS — Z79899 Other long term (current) drug therapy: Secondary | ICD-10-CM | POA: Diagnosis not present

## 2019-03-13 DIAGNOSIS — Z79818 Long term (current) use of other agents affecting estrogen receptors and estrogen levels: Secondary | ICD-10-CM | POA: Insufficient documentation

## 2019-03-13 DIAGNOSIS — C7800 Secondary malignant neoplasm of unspecified lung: Secondary | ICD-10-CM | POA: Diagnosis not present

## 2019-03-13 DIAGNOSIS — C787 Secondary malignant neoplasm of liver and intrahepatic bile duct: Secondary | ICD-10-CM

## 2019-03-13 DIAGNOSIS — Z170 Estrogen receptor positive status [ER+]: Secondary | ICD-10-CM | POA: Diagnosis not present

## 2019-03-13 LAB — CBC WITH DIFFERENTIAL (CANCER CENTER ONLY)
Abs Immature Granulocytes: 0.01 10*3/uL (ref 0.00–0.07)
Basophils Absolute: 0.1 10*3/uL (ref 0.0–0.1)
Basophils Relative: 2 %
Eosinophils Absolute: 0 10*3/uL (ref 0.0–0.5)
Eosinophils Relative: 0 %
HCT: 28.2 % — ABNORMAL LOW (ref 36.0–46.0)
Hemoglobin: 9 g/dL — ABNORMAL LOW (ref 12.0–15.0)
Immature Granulocytes: 0 %
Lymphocytes Relative: 23 %
Lymphs Abs: 0.6 10*3/uL — ABNORMAL LOW (ref 0.7–4.0)
MCH: 34.7 pg — ABNORMAL HIGH (ref 26.0–34.0)
MCHC: 31.9 g/dL (ref 30.0–36.0)
MCV: 108.9 fL — ABNORMAL HIGH (ref 80.0–100.0)
Monocytes Absolute: 0.5 10*3/uL (ref 0.1–1.0)
Monocytes Relative: 17 %
Neutro Abs: 1.6 10*3/uL — ABNORMAL LOW (ref 1.7–7.7)
Neutrophils Relative %: 58 %
Platelet Count: 130 10*3/uL — ABNORMAL LOW (ref 150–400)
RBC: 2.59 MIL/uL — ABNORMAL LOW (ref 3.87–5.11)
RDW: 15.6 % — ABNORMAL HIGH (ref 11.5–15.5)
WBC Count: 2.8 10*3/uL — ABNORMAL LOW (ref 4.0–10.5)
nRBC: 0 % (ref 0.0–0.2)

## 2019-03-13 LAB — CMP (CANCER CENTER ONLY)
ALT: 8 U/L (ref 0–44)
AST: 13 U/L — ABNORMAL LOW (ref 15–41)
Albumin: 3.6 g/dL (ref 3.5–5.0)
Alkaline Phosphatase: 79 U/L (ref 38–126)
Anion gap: 11 (ref 5–15)
BUN: 24 mg/dL — ABNORMAL HIGH (ref 8–23)
CO2: 22 mmol/L (ref 22–32)
Calcium: 9.6 mg/dL (ref 8.9–10.3)
Chloride: 104 mmol/L (ref 98–111)
Creatinine: 1.96 mg/dL — ABNORMAL HIGH (ref 0.44–1.00)
GFR, Est AFR Am: 28 mL/min — ABNORMAL LOW (ref >60)
GFR, Estimated: 24 mL/min — ABNORMAL LOW (ref >60)
Glucose, Bld: 103 mg/dL — ABNORMAL HIGH (ref 70–99)
Potassium: 3.6 mmol/L (ref 3.5–5.1)
Sodium: 137 mmol/L (ref 135–145)
Total Bilirubin: 0.4 mg/dL (ref 0.3–1.2)
Total Protein: 8.1 g/dL (ref 6.5–8.1)

## 2019-03-13 MED ORDER — ALPRAZOLAM 0.5 MG PO TABS: 0.5000 mg | ORAL_TABLET | Freq: Two times a day (BID) | ORAL | 3 refills | Status: AC | PRN

## 2019-03-13 MED ORDER — FULVESTRANT 250 MG/5ML IM SOLN
500.0000 mg | Freq: Once | INTRAMUSCULAR | Status: AC
  Administered 2019-03-13: 500 mg via INTRAMUSCULAR
  Filled 2019-03-13: qty 10

## 2019-03-13 MED ORDER — ZOLEDRONIC ACID 4 MG/100ML IV SOLN: 4.0000 mg | Freq: Once | INTRAVENOUS | Status: DC

## 2019-03-13 MED ORDER — HEPARIN SOD (PORK) LOCK FLUSH 100 UNIT/ML IV SOLN
500.0000 [IU] | Freq: Once | INTRAVENOUS | Status: AC | PRN
  Administered 2019-03-13: 500 [IU]
  Filled 2019-03-13: qty 5

## 2019-03-13 MED ORDER — AMLODIPINE BESYLATE 10 MG PO TABS: 10.0000 mg | ORAL_TABLET | Freq: Every day | ORAL | 3 refills | Status: AC

## 2019-03-13 MED ORDER — AMLODIPINE BESYLATE 10 MG PO TABS: 10.0000 mg | ORAL_TABLET | Freq: Every day | ORAL | 3 refills | Status: DC

## 2019-03-13 MED ORDER — SODIUM CHLORIDE 0.9 % IV SOLN
Freq: Once | INTRAVENOUS | Status: AC
  Administered 2019-03-13: 15:00:00 via INTRAVENOUS
  Filled 2019-03-13: qty 250

## 2019-03-13 MED ORDER — ANASTROZOLE 1 MG PO TABS: 1.0000 mg | ORAL_TABLET | Freq: Every day | ORAL | 3 refills | Status: DC

## 2019-03-13 MED ORDER — SODIUM CHLORIDE 0.9% FLUSH
10.0000 mL | Freq: Once | INTRAVENOUS | Status: AC | PRN
  Administered 2019-03-13: 10 mL
  Filled 2019-03-13: qty 10

## 2019-03-13 MED ORDER — ALPRAZOLAM 0.5 MG PO TABS: 0.5000 mg | ORAL_TABLET | Freq: Two times a day (BID) | ORAL | 3 refills | Status: DC | PRN

## 2019-03-13 MED ORDER — FULVESTRANT 250 MG/5ML IM SOLN
INTRAMUSCULAR | Status: AC
  Filled 2019-03-13: qty 5

## 2019-03-13 MED ORDER — ZOLEDRONIC ACID 4 MG/5ML IV CONC
3.0000 mg | Freq: Once | INTRAVENOUS | Status: AC
  Administered 2019-03-13: 3 mg via INTRAVENOUS
  Filled 2019-03-13: qty 3.75

## 2019-03-13 NOTE — Progress Notes (Signed)
Per Dr. Lindi Adie note, he reviewed blood work today [place] for Zometa infusion.

## 2019-03-13 NOTE — Patient Instructions (Signed)
[facility] Discharge Instructions for Patients Receiving Chemotherapy  Today you received the following treatment today:  Zometa, Faslodex  To help prevent nausea and vomiting after your treatment, we encourage you to take your nausea medication: As directed by MD   If you develop nausea and vomiting that is not controlled by your nausea medication, call the clinic.   BELOW ARE SYMPTOMS THAT SHOULD BE REPORTED IMMEDIATELY:  *FEVER GREATER THAN 100.5 F  *CHILLS WITH OR WITHOUT FEVER  NAUSEA AND VOMITING THAT IS NOT CONTROLLED WITH YOUR NAUSEA MEDICATION  *UNUSUAL SHORTNESS OF BREATH  *UNUSUAL BRUISING OR BLEEDING  TENDERNESS IN MOUTH AND THROAT WITH OR WITHOUT PRESENCE OF ULCERS  *URINARY PROBLEMS  *BOWEL PROBLEMS  UNUSUAL RASH Items with * indicate a potential emergency and should be followed up as soon as possible.  Feel free to call the clinic should you have any questions or concerns. The clinic phone number is [phone].  Please show the [name] at check-in to the Emergency Department and triage nurse.

## 2019-03-13 NOTE — Progress Notes (Signed)
  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 She experienced moderate fatigue and continued edema in her right arm but reports better sensation in the tips of her right fingers. No skin irritation or changes noted.   Plan: The patient will follow-up with radiation oncology in one month. She is wondering if she should resume Ibrance after radiation therapy. I sent a note to Dr. Lindi Adie about this.  ________________________________________________  Eppie Gibson, MD  This document serves as a record of services personally performed by Eppie Gibson, MD. It was created on her behalf by Rae Lips, a trained medical scribe. The creation of this record is based on the scribe's personal observations and the provider's statements to them. This document has been checked and approved by the attending provider.

## 2019-03-15 ENCOUNTER — Telehealth: Payer: Self-pay | Admitting: Hematology and Oncology

## 2019-03-15 NOTE — Telephone Encounter (Signed)
Called regarding schedule °

## 2019-03-27 MED FILL — ELIQUIS 5 MG TABLET: 5 | 30 days supply | Qty: 60 | Fill #1

## 2019-03-27 MED FILL — ANASTROZOLE 1 MG TABLET: 1 | 90 days supply | Qty: 90 | Fill #0

## 2019-03-27 MED FILL — ALPRAZolam 0.5 MG TABS: 0.5 | 30 days supply | Qty: 60 | Fill #0

## 2019-03-27 MED FILL — IBRANCE 100 MG CAPSULE: 100 | 28 days supply | Qty: 21 | Fill #3

## 2019-03-27 MED FILL — AMLODIPINE BESYLATE 10 MG T: 10 | 90 days supply | Qty: 90 | Fill #0

## 2019-03-27 MED FILL — GABAPENTIN 100 MG CAPSULE: 100 | 30 days supply | Qty: 30 | Fill #1

## 2019-03-30 DIAGNOSIS — Z0189 Encounter for other specified special examinations: Secondary | ICD-10-CM | POA: Diagnosis not present

## 2019-04-10 ENCOUNTER — Encounter: Payer: Self-pay | Admitting: General Practice

## 2019-04-10 ENCOUNTER — Encounter: Payer: Self-pay | Admitting: Adult Health

## 2019-04-10 ENCOUNTER — Inpatient Hospital Stay (HOSPITAL_BASED_OUTPATIENT_CLINIC_OR_DEPARTMENT_OTHER): Payer: Medicare HMO | Admitting: Adult Health

## 2019-04-10 ENCOUNTER — Inpatient Hospital Stay: Payer: Medicare HMO | Attending: Oncology

## 2019-04-10 ENCOUNTER — Other Ambulatory Visit: Payer: Self-pay | Admitting: Adult Health

## 2019-04-10 ENCOUNTER — Other Ambulatory Visit: Payer: Self-pay

## 2019-04-10 VITALS — BP 119/65 | HR 82 | Temp 98.6°F | Resp 18 | Ht 63.0 in | Wt 193.9 lb

## 2019-04-10 DIAGNOSIS — Z170 Estrogen receptor positive status [ER+]: Secondary | ICD-10-CM

## 2019-04-10 DIAGNOSIS — E785 Hyperlipidemia, unspecified: Secondary | ICD-10-CM

## 2019-04-10 DIAGNOSIS — F419 Anxiety disorder, unspecified: Secondary | ICD-10-CM | POA: Diagnosis not present

## 2019-04-10 DIAGNOSIS — I10 Essential (primary) hypertension: Secondary | ICD-10-CM

## 2019-04-10 DIAGNOSIS — Z79811 Long term (current) use of aromatase inhibitors: Secondary | ICD-10-CM

## 2019-04-10 DIAGNOSIS — Z9221 Personal history of antineoplastic chemotherapy: Secondary | ICD-10-CM

## 2019-04-10 DIAGNOSIS — Z95828 Presence of other vascular implants and grafts: Secondary | ICD-10-CM

## 2019-04-10 DIAGNOSIS — M1990 Unspecified osteoarthritis, unspecified site: Secondary | ICD-10-CM

## 2019-04-10 DIAGNOSIS — C787 Secondary malignant neoplasm of liver and intrahepatic bile duct: Secondary | ICD-10-CM | POA: Insufficient documentation

## 2019-04-10 DIAGNOSIS — Z79899 Other long term (current) drug therapy: Secondary | ICD-10-CM | POA: Diagnosis not present

## 2019-04-10 DIAGNOSIS — Z8673 Personal history of transient ischemic attack (TIA), and cerebral infarction without residual deficits: Secondary | ICD-10-CM | POA: Insufficient documentation

## 2019-04-10 DIAGNOSIS — C7802 Secondary malignant neoplasm of left lung: Secondary | ICD-10-CM | POA: Diagnosis not present

## 2019-04-10 DIAGNOSIS — C7951 Secondary malignant neoplasm of bone: Secondary | ICD-10-CM

## 2019-04-10 DIAGNOSIS — R269 Unspecified abnormalities of gait and mobility: Secondary | ICD-10-CM

## 2019-04-10 DIAGNOSIS — Z923 Personal history of irradiation: Secondary | ICD-10-CM | POA: Diagnosis not present

## 2019-04-10 DIAGNOSIS — C773 Secondary and unspecified malignant neoplasm of axilla and upper limb lymph nodes: Secondary | ICD-10-CM | POA: Diagnosis not present

## 2019-04-10 DIAGNOSIS — Z86718 Personal history of other venous thrombosis and embolism: Secondary | ICD-10-CM

## 2019-04-10 DIAGNOSIS — Z87891 Personal history of nicotine dependence: Secondary | ICD-10-CM | POA: Insufficient documentation

## 2019-04-10 DIAGNOSIS — C50911 Malignant neoplasm of unspecified site of right female breast: Secondary | ICD-10-CM | POA: Insufficient documentation

## 2019-04-10 DIAGNOSIS — Z79818 Long term (current) use of other agents affecting estrogen receptors and estrogen levels: Secondary | ICD-10-CM

## 2019-04-10 DIAGNOSIS — K219 Gastro-esophageal reflux disease without esophagitis: Secondary | ICD-10-CM | POA: Diagnosis not present

## 2019-04-10 DIAGNOSIS — F82 Specific developmental disorder of motor function: Secondary | ICD-10-CM

## 2019-04-10 DIAGNOSIS — E559 Vitamin D deficiency, unspecified: Secondary | ICD-10-CM | POA: Diagnosis not present

## 2019-04-10 DIAGNOSIS — C7801 Secondary malignant neoplasm of right lung: Secondary | ICD-10-CM | POA: Diagnosis not present

## 2019-04-10 DIAGNOSIS — C7972 Secondary malignant neoplasm of left adrenal gland: Secondary | ICD-10-CM | POA: Insufficient documentation

## 2019-04-10 DIAGNOSIS — F909 Attention-deficit hyperactivity disorder, unspecified type: Secondary | ICD-10-CM

## 2019-04-10 IMAGING — MR MR CERVICAL SPINE W/O CM
4 of 5 series · 23 of 48 positions shown · non-contrast
Comparison: Nuclear medicine whole-body bone scan 05/23/2018. Chest
CT 05/23/2018.

CLINICAL DATA: Right arm numbness. History of stage IV breast
cancer. No IV contrast administered due to renal insufficiency (GFR
of 22).

EXAM:
MRI CERVICAL SPINE WITHOUT CONTRAST
TECHNIQUE: Multiplanar, multisequence MR imaging of the cervical spine was
performed. No intravenous contrast was administered.

[Series 2: T1 · sagittal · 3.0mm · 0.41mm/px · 5 of 12 slices shown]
[im 1/12]
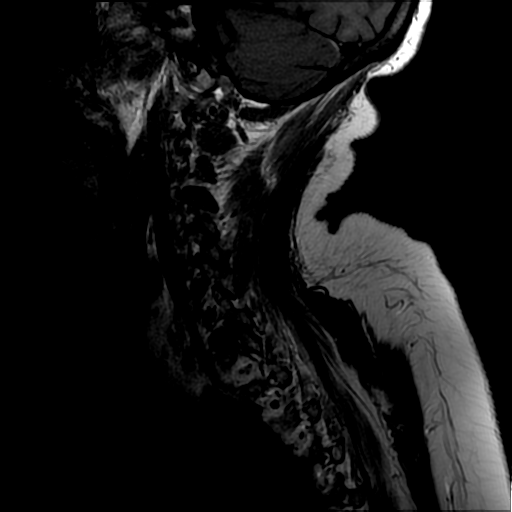
[im 3/12]
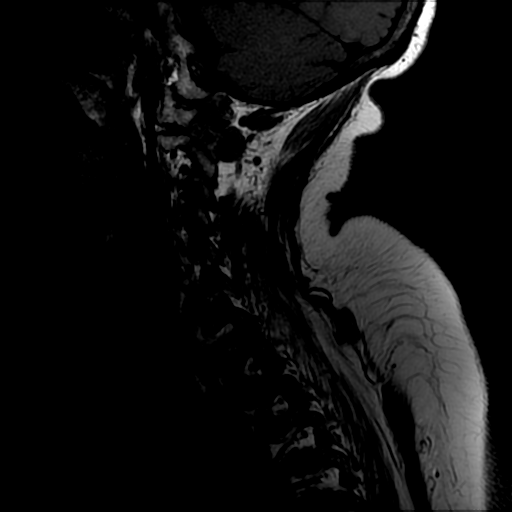
[im 6/12]
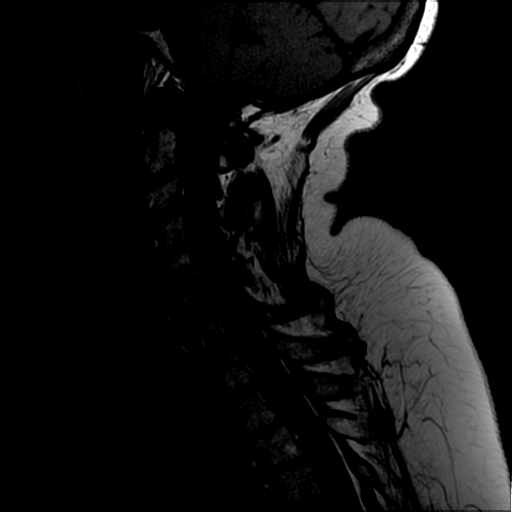
[im 9/12]
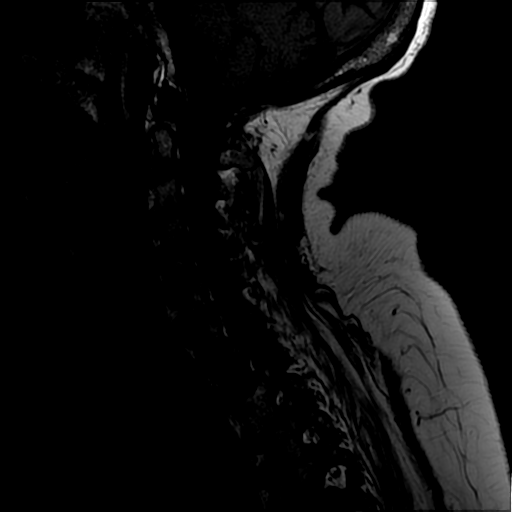
[im 12/12]
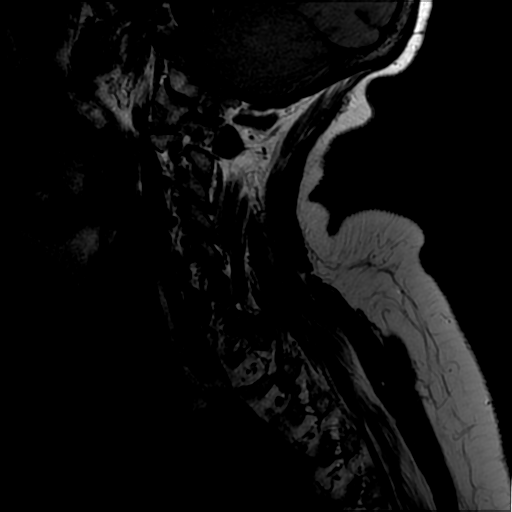

[Series 3: sag ir · sagittal · 3.0mm · 0.41mm/px · 3 of 12 slices shown]
[im 3/12]
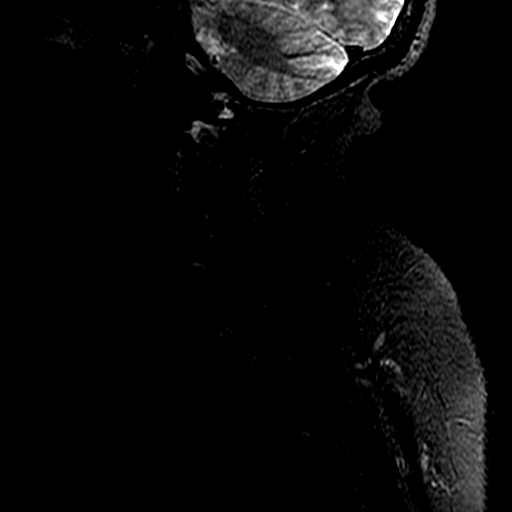
[im 7/12]
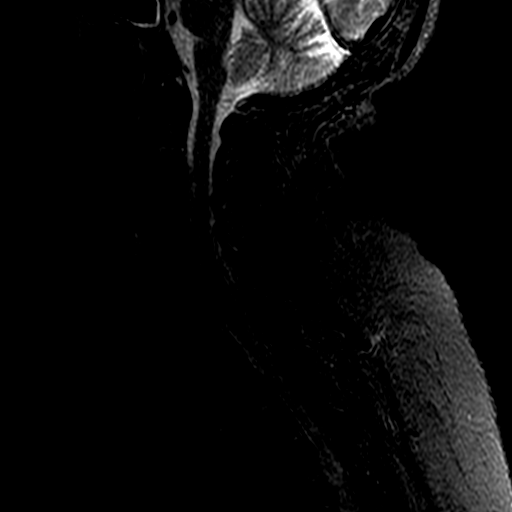
[im 12/12]
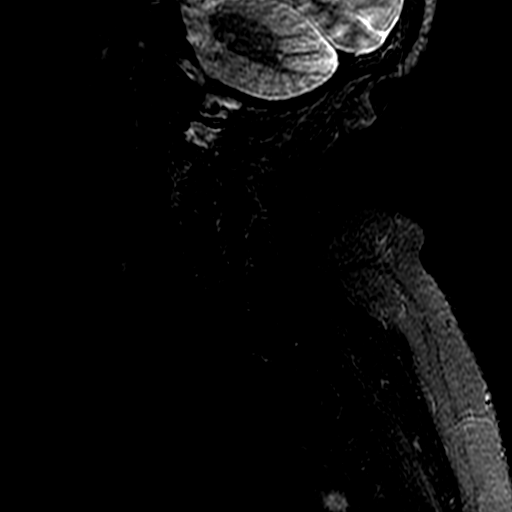

[Series 4: T2 · axial · 3.0mm · 0.70mm/px · z∈[-35,+81]mm · 9 of 40 slices shown]
[im 3/40]
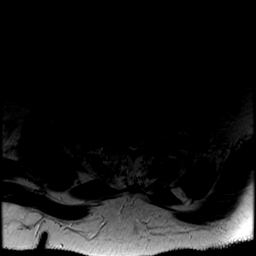
[im 7/40]
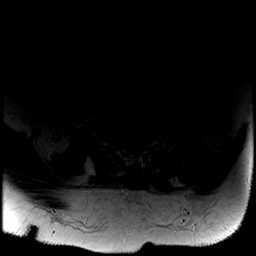
[im 11/40]
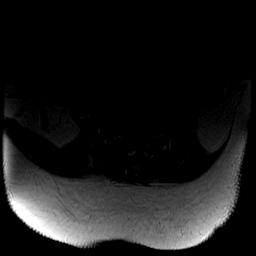
[im 18/40]
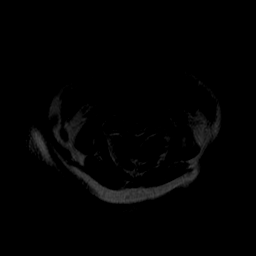
[im 20/40]
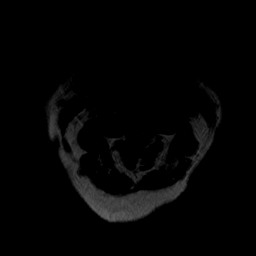
[im 22/40]
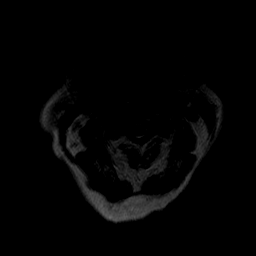
[im 29/40]
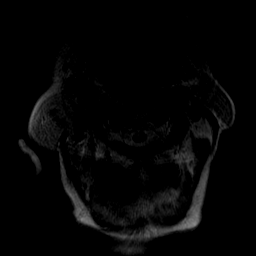
[im 33/40]
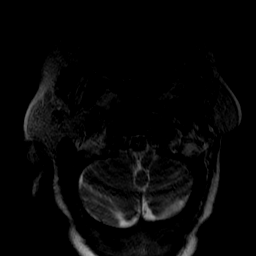
[im 37/40]
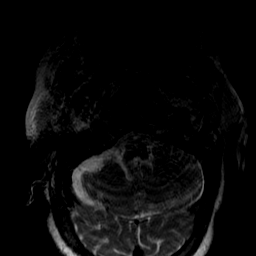

[Series 5: T2 post-contrast · sagittal · 3.0mm · 0.41mm/px · 6 of 12 slices shown]
[im 1/12]
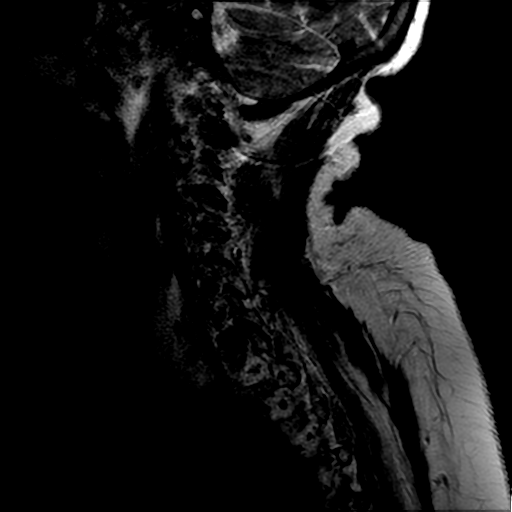
[im 3/12]
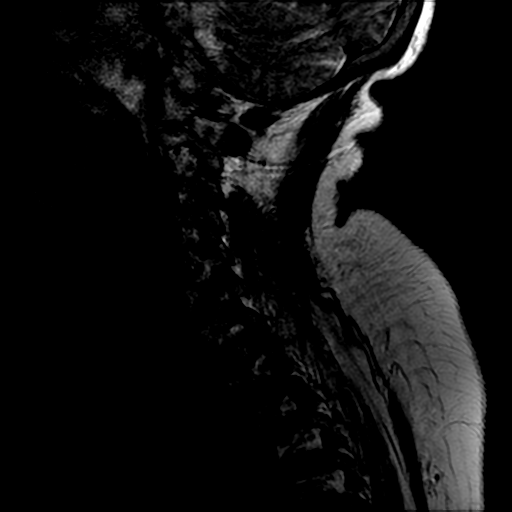
[im 5/12]
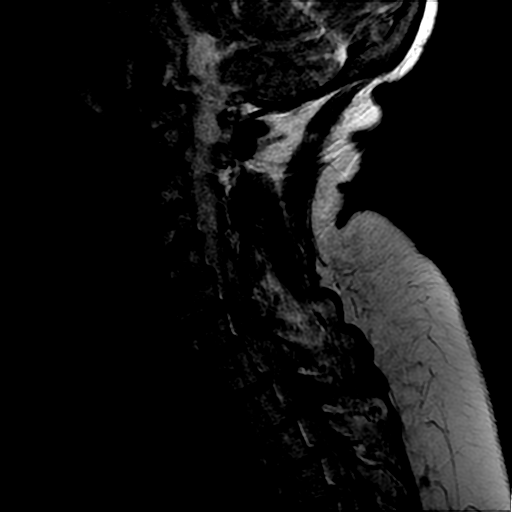
[im 7/12]
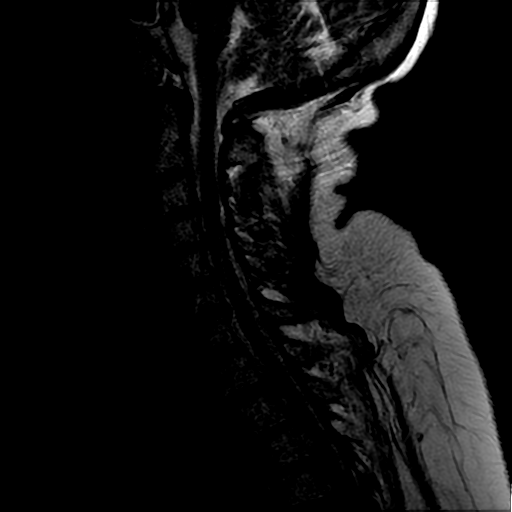
[im 9/12]
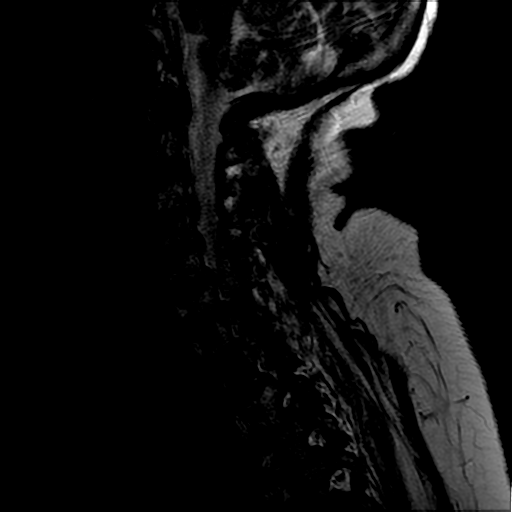
[im 12/12]
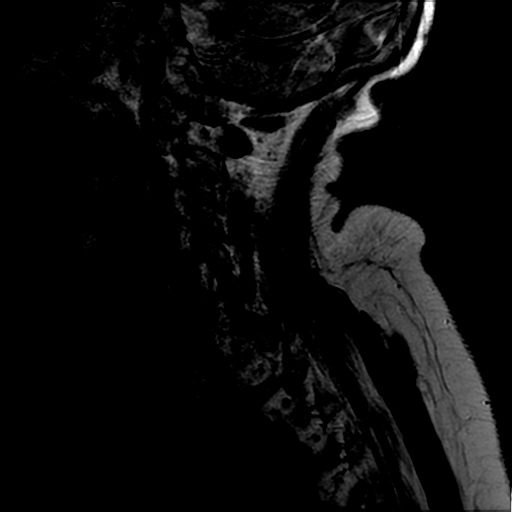

[23 of 48 positions shown; findings below may reference images not displayed]

FINDINGS: Multiple sequences are moderately too severely motion degraded as
the patient was in pain and unable to remain motionless during the
examination.

Alignment: Trace retrolisthesis of C4 on C5 and trace
anterolisthesis of C7 on T1, likely degenerative.

Vertebrae: No fracture, significant marrow edema, or destructive
bone lesion. Sclerosis in the right T2 lamina and inferior articular
process, also present on CT.

Cord: Limited assessment due to motion artifact. No gross cord
signal abnormality or significant cord compression.

Posterior Fossa, vertebral arteries, paraspinal tissues: Suspected
tiny chronic infarcts in the cerebellum bilaterally. Grossly
preserved vertebral artery flow voids.

Disc levels:

C2-3: Mild disc bulging without stenosis.

C3-4: Mild disc bulging and mild facet arthrosis result in
borderline to mild spinal stenosis without evidence of significant
neural foraminal stenosis.

C4-5: Mild disc bulging and uncovertebral spurring result in likely
mild right neural foraminal stenosis and borderline to mild spinal
stenosis.

C5-6: Mild disc bulging and uncovertebral spurring without evidence
of significant stenosis.

C6-7: Mild disc bulging, small central disc protrusion, and
uncovertebral spurring result in borderline spinal stenosis without
evidence of significant neural foraminal stenosis.

C7-T1: There is intermediate to low T1 and T2 signal intensity
material predominantly located lateral to the right C7-T1 neural
foramen which appears to extend into the posterolateral aspect of
the foramen. This is poorly evaluated on axial images due to motion
and was incompletely imaged on sagittal sequences but appears to
measure approximately 1 cm in maximal dimension. No significant
regional edema is evident on STIR imaging. Similar soft tissue was
present in this location on a 02/06/2018 chest CT, however this
finding is new from 4749. There is stenosis of the lateral aspect of
the right neural foramen with potential C8 nerve root impingement.
There is mild right and moderate left facet arthrosis without
significant nerve left neural foraminal stenosis or spinal stenosis.

Imaging of the upper thoracic spine in the sagittal plane only
demonstrates disc bulging from T1-2 to T4-5 resulting in up to mild
spinal stenosis without significant spinal cord mass effect. There
is mild left neural foraminal stenosis at T2-3.
IMPRESSION: 1. Moderately motion degraded examination. No IV contrast was given
due to renal insufficiency.
2. Abnormal soft tissue within and predominantly lateral to the
right C7-T1 neural foramen of uncertain etiology. Potential
considerations include an unusual, large disc herniation, atypical
cyst arising from the mildly degenerated right facet joint, or
tumor. A nerve sheath tumor is considered unlikely as this does not
appear to have been present in 4749, however a metastasis is
possible. Repeat imaging through this level could be useful if the
patient's pain was controlled so that she was able to better
tolerate the examination with less movement and if additional
information from repeat imaging was deemed clinically important
enough to warrant the administration of IV contrast (excepting the
presumably very small risk of NSF in the setting of renal
insufficiency).
3. Mild disc and facet degeneration at other levels without evidence
of high-grade stenosis.
4. Sclerotic bone metastasis involving the right-sided posterior
elements at T2.

## 2019-04-10 MED ORDER — FULVESTRANT 250 MG/5ML IM SOLN
500.0000 mg | Freq: Once | INTRAMUSCULAR | Status: AC
  Administered 2019-04-10: 13:00:00 500 mg via INTRAMUSCULAR

## 2019-04-10 MED ORDER — FULVESTRANT 250 MG/5ML IM SOLN
INTRAMUSCULAR | Status: AC
  Filled 2019-04-10: qty 10

## 2019-04-10 NOTE — Progress Notes (Signed)
[name] [name], [name] Lacks, MD [name] [address]   DIAGNOSIS: Cancer Staging Breast cancer metastasized to bone, right [facility]) Staging form: Breast, AJCC 7th Edition - Clinical: Metastatic breast adenocarcinoma - Signed by Curt Bears, MD on 02/25/2014   SUMMARY OF ONCOLOGIC HISTORY:   Breast cancer metastasized to bone, right ([place])   05/22/2001 Initial Diagnosis    Rt breast: stage IIb (T2, N1, M0) invasive right breast carcinoma with positive estrogen and progesterone receptor as well as HER-2/neu    06/12/2001 Surgery    right lumpectomy with right axillary lymph node dissection revealing 4/9 lymph nodes that were positive for malignancy.     08/12/2001 - 01/10/2002 Chemotherapy    Adj chemo CMF X 8     05/07/2002 - 12/14/2007 Anti-estrogen oral therapy    Adj Tamoxifen for 2 years and then switched to Letrozole (by Dr.Khan in Aug 2005), stopped Feb 2009    02/11/2003 - 04/05/2003 Radiation Therapy    Adj XRT by Tammi Klippel    03/18/2011 -  Anti-estrogen oral therapy    Anastrozole 1 mg daily    04/01/2011 Surgery    Left total hip replacement secondary to pathologic left femoral neck fracture    04/25/2017 Relapse/Recurrence    Anastrozole with Ibrance discontinued 05/30/2018 due to disease progression, Xgeva for bone metastases every 3 months    06/19/2018 Procedure    Liver biopsy: Metastatic breast cancer, ER 100%, PR 0%, Ki-67 50%, HER-2 negative    07/06/2018 Miscellaneous    Foundation 1 results: Microsatellite stable, tumor mutational burden 15 mutations per MB, ATRX, CCND1, CDK4 amplification, ERBB4 amplification, ESR1 amplification, FGFR 1, MDM2 amplification, MEM1, NST3 amplification, PIK3CA mutation, ZNF703 amplification; PDL 1: 0%    07/06/2018 -  Anti-estrogen oral therapy    Ibrance with Faslodex    01/26/2019 - 02/07/2019 Radiation Therapy    Palliative radiation to the neck and subclavian area     CURRENT  THERAPY:  Ibrance/Faslodex  INTERVAL HISTORY: [name] [name] 78 y.o. female returns for evaluation today of her ER positive metastatic breast cancer.  [name] [name] is supposed to move from an over crowded apartment to a more spacious senior citizen apartment.  [name] [name] wants to know if she needs follow up with Dr. Isidore Moos.  Swayzie needs a letter to get a stand up walker. She had an issue with her rolling walker that she received from [facility] home [facility].     [name] [name] notes that she has continued shortness of breath.  She notes this is stable and unchanged.  She says that her appetite is sporadic.  She notes that will order food via delivery.  She sometimes will skip meals due to no meal delivery.  She has challenges with using her right hand due to numbness, which can interfere with her ability to prepare food. [name] [name] has family around, but has limited help in the home.  She does have assistance with putting out trash.  She does sometimes get some help with getting food picked up.  She was previously getting frozen dinners from [facility], she has not during the pandemic, and hopes it will start up soon.    [name] [name] notes that she has not yet started her Ibrance back.  She has 139m per day ordered. She is going to start back today.  [name] gets her labs checked every 3 months while taking the Ibrance.  She also receives Zometa every 3 months, last  received on 5/12.  She denies any dental issues as she is edentulous.  She receives Fulvestrant every 4 weeks, due for repeat today.  She tolerates these injections well.    [name] [name] is taking Eliquis BID.  She tolerates this well.  She denies any easy bruising or bleeding.      Patient Active Problem List   Diagnosis Date Noted  . Secondary malignant neoplasm of axillary node ([place]) 01/15/2019  . DVT (deep venous thrombosis) ([place]) 12/20/2018  . Hip pain 10/09/2018  . Hand weakness 08/21/2018  . Ingrowing toenail 04/19/2018  . Stress 12/20/2017  . Chemotherapy  induced neutropenia ([place]) 08/15/2017  . Goals of care, counseling/discussion 04/19/2017  . Encounter for therapeutic drug monitoring 04/14/2017  . Arm pain, anterior, left 03/08/2017  . Dry skin dermatitis 11/29/2016  . Diarrhea 11/12/2016  . Left shoulder pain 08/26/2016  . Port catheter in place 07/20/2016  . Rash and nonspecific skin eruption 04/13/2016  . Leg swelling 03/02/2016  . Obese 10/29/2015  . S/P right THA, AA 10/28/2015  . Full code status 08/06/2015  . Knee pain, right 07/28/2015  . Osteoarthritis of right lower extremity 09/17/2014  . Cyst of lateral meniscus 08/27/2014  . Cyst of lateral meniscus of right knee 08/27/2014  . Benign paroxysmal positional vertigo 07/12/2014  . Hypokalemia 07/06/2013  . Knee pain, right anterior 04/04/2013  . Creatinine elevation 04/04/2013  . Dyspnea 05/12/2012  . GERD (gastroesophageal reflux disease) 05/12/2012  . Secondary diabetes mellitus with renal manifestations ([place]) 02/11/2012  . Breast cancer metastasized to bone, right ([place]) 04/23/2011  . Edema 04/23/2011  . Situational anxiety 11/03/2010  . Osteoarthritis 10/21/2010  . Low back pain 10/21/2010  . HIP PAIN 10/07/2010  . NAUSEA AND VOMITING 08/26/2010  . HYPERLIPIDEMIA 02/18/2010  . CEREBROVASCULAR ACCIDENT, HX OF 02/18/2010  . WEIGHT GAIN, ABNORMAL 12/19/2009  . LEG PAIN, RIGHT 04/16/2009  . Vitamin D deficiency 03/07/2009  . Hidradenitis 04/12/2008  . FATIGUE 10/03/2007  . 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 Essential hypertension 10/02/2007  . Asthma 10/02/2007    has No Known Allergies.  MEDICAL HISTORY: Past Medical History:  Diagnosis Date  . Anxiety    hx of  . Asthma   . Breast cancer ([place])    2002; metastatic in 2012, right breast, spread to left hip in 2012  . Cerebrovascular accident [facility])    R thalamic CVA 01/2010  . Chemotherapy induced neutropenia ([place]) 08/15/2017  . Dyslipidemia   . Encounter for therapeutic drug monitoring 04/14/2017  . Goals  of care, counseling/discussion 04/19/2017  . Hyperlipemia   . Hypertension   . Low back pain   . Osteoarthritis   . TIA (transient ischemic attack)    x2 in 2011  . Vertigo 2014    SURGICAL HISTORY: Past Surgical History:  Procedure Laterality Date  . ABDOMINAL HYSTERECTOMY     complete  . BREAST LUMPECTOMY     Right breast 2002  . JOINT REPLACEMENT  2012   L hip due to breast ca met  . TOTAL HIP ARTHROPLASTY Right 10/28/2015   Procedure: RIGHT TOTAL HIP ARTHROPLASTY ANTERIOR APPROACH;  Surgeon: Paralee Cancel, MD;  Location: [facility];  Service: Orthopedics;  Laterality: Right;    SOCIAL HISTORY: Social History   Socioeconomic History  . Marital status: Divorced    Spouse name: Not on file  . Number of children: 1  . Years of education: Not on file  . Highest education level: Not on file  Occupational History  .  Not on file  Social Needs  . Financial resource strain: Not hard at all  . Food insecurity:    Worry: Never true    Inability: Never true  . Transportation needs:    Medical: Yes    Non-medical: Yes  Tobacco Use  . Smoking status: Former Smoker    Packs/day: 1.00    Years: 40.00    Pack years: 40.00    Types: Cigarettes    Last attempt to quit: 02/12/2011    Years since quitting: 8.1  . Smokeless tobacco: Never Used  Substance and Sexual Activity  . Alcohol use: Yes    Comment: sometimes daily, some occasional  . Drug use: No  . Sexual activity: Never  Lifestyle  . Physical activity:    Days per week: 0 days    Minutes per session: 0 min  . Stress: Only a little  Relationships  . Social connections:    Talks on phone: More than three times a week    Gets together: More than three times a week    Attends religious service: Never    Active member of club or organization: Yes    Attends meetings of clubs or organizations: More than 4 times per year    Relationship status: Divorced  . Intimate partner violence:    Fear of current or ex partner: Not  on file    Emotionally abused: Not on file    Physically abused: Not on file    Forced sexual activity: Not on file  Other Topics Concern  . Not on file  Social History Narrative   Occupation: Network engineer at [employer]   Current smoker   single    FAMILY HISTORY: Family History  Problem Relation Age of Onset  . Hypertension Mother   . Hypertension Father   . Cancer Neg Hx     Review of Systems  Constitutional: Negative for appetite change, chills, fatigue, fever and unexpected weight change.  HENT:   Negative for hearing loss, mouth sores and trouble swallowing.   Eyes: Negative for eye problems and icterus.  Respiratory: Negative for chest tightness, cough and shortness of breath.   Cardiovascular: Negative for chest pain, leg swelling and palpitations.  Gastrointestinal: Negative for abdominal distention, abdominal pain, constipation, diarrhea, nausea and vomiting.  Endocrine: Negative for hot flashes.  Genitourinary: Negative for difficulty urinating.   Musculoskeletal: Negative for arthralgias.  Skin: Negative for itching and rash.  Neurological: Positive for numbness. Negative for dizziness, extremity weakness and headaches.  Hematological: Negative for adenopathy. Does not bruise/bleed easily.  Psychiatric/Behavioral: Negative for depression. The patient is not nervous/anxious.       PHYSICAL EXAMINATION  ECOG PERFORMANCE STATUS: 1 - Symptomatic but completely ambulatory  Vitals:   04/10/19 1039  BP: 119/65  Pulse: 82  Resp: 18  Temp: 98.6 F (37 C)  SpO2: 99%    Physical Exam Constitutional:      General: She is not in acute distress.    Appearance: Normal appearance. She is not toxic-appearing.  HENT:     Head: Normocephalic and atraumatic.     Mouth/Throat:     Mouth: Mucous membranes are moist.     Pharynx: No oropharyngeal exudate or posterior oropharyngeal erythema.  Eyes:     General: No scleral icterus.    Pupils: Pupils are equal, round, and  reactive to light.  Neck:     Musculoskeletal: Neck supple.  Cardiovascular:     Rate and Rhythm: Normal rate and  regular rhythm.     Pulses: Normal pulses.     Heart sounds: Normal heart sounds.  Pulmonary:     Effort: Pulmonary effort is normal.     Breath sounds: Normal breath sounds.  Abdominal:     General: Abdomen is flat. Bowel sounds are normal.     Palpations: Abdomen is soft.  Musculoskeletal:        General: No swelling.  Lymphadenopathy:     Cervical: No cervical adenopathy.  Skin:    General: Skin is warm and dry.     Capillary Refill: Capillary refill takes less than 2 seconds.     Findings: No rash.  Neurological:     General: No focal deficit present.     Mental Status: She is alert.  Psychiatric:        Mood and Affect: Mood normal.     LABORATORY DATA:  CBC    Component Value Date/Time   WBC 2.8 (L) 03/13/2019 1307   WBC 3.2 (L) 12/13/2018 1103   RBC 2.59 (L) 03/13/2019 1307   HGB 9.0 (L) 03/13/2019 1307   HGB 11.0 (L) 11/03/2017 1044   HCT 28.2 (L) 03/13/2019 1307   HCT 33.2 (L) 11/03/2017 1044   PLT 130 (L) 03/13/2019 1307   PLT 111 (L) 11/03/2017 1044   MCV 108.9 (H) 03/13/2019 1307   MCV 112.5 (H) 11/03/2017 1044   MCH 34.7 (H) 03/13/2019 1307   MCHC 31.9 03/13/2019 1307   RDW 15.6 (H) 03/13/2019 1307   RDW 15.1 (H) 11/03/2017 1044   LYMPHSABS 0.6 (L) 03/13/2019 1307   LYMPHSABS 0.4 (L) 11/03/2017 1044   MONOABS 0.5 03/13/2019 1307   MONOABS 0.4 11/03/2017 1044   EOSABS 0.0 03/13/2019 1307   EOSABS 0.0 11/03/2017 1044   BASOSABS 0.1 03/13/2019 1307   BASOSABS 0.0 11/03/2017 1044    CMP     Component Value Date/Time   NA 137 03/13/2019 1307   NA 140 11/03/2017 1044   K 3.6 03/13/2019 1307   K 3.5 11/03/2017 1044   CL 104 03/13/2019 1307   CL 109 (H) 04/25/2013 1023   CO2 22 03/13/2019 1307   CO2 23 11/03/2017 1044   GLUCOSE 103 (H) 03/13/2019 1307   GLUCOSE 103 11/03/2017 1044   GLUCOSE 108 (H) 04/25/2013 1023   BUN 24 (H)  03/13/2019 1307   BUN 9.7 11/03/2017 1044   CREATININE 1.96 (H) 03/13/2019 1307   CREATININE 1.4 (H) 11/03/2017 1044   CALCIUM 9.6 03/13/2019 1307   CALCIUM 9.2 11/03/2017 1044   PROT 8.1 03/13/2019 1307   PROT 8.2 11/03/2017 1044   ALBUMIN 3.6 03/13/2019 1307   ALBUMIN 3.6 11/03/2017 1044   AST 13 (L) 03/13/2019 1307   AST 27 11/03/2017 1044   ALT 8 03/13/2019 1307   ALT 16 11/03/2017 1044   ALKPHOS 79 03/13/2019 1307   ALKPHOS 74 11/03/2017 1044   BILITOT 0.4 03/13/2019 1307   BILITOT 0.75 11/03/2017 1044   GFRNONAA 24 (L) 03/13/2019 1307   GFRAA 28 (L) 03/13/2019 1307       ASSESSMENT and PLAN:   Breast cancer metastasized to bone, right ([place]) Metastatic breast cancer with bone metastases along with lung and liver metastases, progressed on Ibrance with anastrozole.  Radiology review: 05/23/2018 CT CAP:New liver metastases 3.1 cm,increase in lung nodules, increase in sclerotic lesion of T11 and T9and S1 vertebral body. Liver biopsy 06/19/2018: Metastatic breast cancer ER 100%, PR 0%, Ki-67 50%, HER-2 negative BRCA1 mutation: Negative  PDL  1: 0%and foundation 1:PI 3 kinase mutation was identified ----------------------------------------------------------------------------------------------------------------------------------------------------------------------- Current treatment: Faslodex with Ibrance started8/29/2019  Patient's insurance rejected Xgeva.OnZometa every 3 months.   Palliative radiation: 01/25/2019-02/07/2019 Resumed Ibrance with Faslodex.  Mega is doing well on Ibrance and with her Faslodex.  She has labs every three months and is due for Faslodex today.  She is not due for labs today.  She was supposed to have scans prior to today's visit, however these were not done. My nurse called and scheduled these, prior to taking her for her injection today. I instructed her to restart her Ibrance.    Ayzia also has difficulty with motor function of her  right hand which makes it difficult to make food (opening jars, cans, etc).  Sometimes she will not eat if she cannot get meals delivered. My nurse is reaching out to see if we can get her HH-OT to evaluate.  Also, she needs a standing walker. My nurse has called [place] to get this arranged.  I called our social worker, Edwyna Shell with [place]'s information, due to her disability and missing meals, so she can follow up.    Shekelia will return in 4 weeks for her next injection.       All questions were answered. The patient knows to call the clinic with any problems, questions or concerns. We can certainly see the patient much sooner if necessary.  A total of (30) minutes of face-to-face time was spent with this patient with greater than 50% of that time in counseling and care-coordination.  This note was electronically signed. Scot Dock, NP 04/10/2019

## 2019-04-10 NOTE — Progress Notes (Signed)
[place] CSW Progress Notes  Call from Wilber Bihari, NP - patient reports she is missing meals "when she does not get a food delivery."  Attempted to call patient to get more information, no answer and no VM.  Noted patient is active w [facility], message sent to [name] RN to determine if [facility] has any resources for food delivery to patient or other resources.  CSW will try to contact patient again.    Edwyna Shell, LCSW Clinical Social Worker Phone:  [phone]

## 2019-04-10 NOTE — Assessment & Plan Note (Addendum)
Metastatic breast cancer with bone metastases along with lung and liver metastases, progressed on Ibrance with anastrozole.  Radiology review: 05/23/2018 CT CAP:New liver metastases 3.1 cm,increase in lung nodules, increase in sclerotic lesion of T11 and T9and S1 vertebral body. Liver biopsy 06/19/2018: Metastatic breast cancer ER 100%, PR 0%, Ki-67 50%, HER-2 negative BRCA1 mutation: Negative  PDL 1: 0%and foundation 1:PI 3 kinase mutation was identified ----------------------------------------------------------------------------------------------------------------------------------------------------------------------- Current treatment: Faslodex with Ibrance started8/29/2019  Patient's insurance rejected Xgeva.OnZometa every 3 months.   Palliative radiation: 01/25/2019-02/07/2019 Resumed Ibrance with Faslodex.  [name] [name] is doing well on Ibrance and with her Faslodex.  She has labs every three months and is due for Faslodex today.  She is not due for labs today.  She was supposed to have scans prior to today's visit, however these were not done. My nurse called and scheduled these, prior to taking her for her injection today. I instructed her to restart her Ibrance.    [name] also has difficulty with motor function of her right hand which makes it difficult to make food (opening jars, cans, etc).  Sometimes she will not eat if she cannot get meals delivered. My nurse is reaching out to see if we can get her HH-OT to evaluate.  Also, she needs a standing walker. My nurse has called [place] to get this arranged.  I called our social worker, Edwyna Shell with [name]'s information, due to her disability and missing meals, so she can follow up.    [name] will return in 4 weeks for her next injection.

## 2019-04-11 ENCOUNTER — Encounter: Payer: Self-pay | Admitting: General Practice

## 2019-04-11 ENCOUNTER — Telehealth: Payer: Self-pay | Admitting: Hematology and Oncology
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
## 2019-04-11 NOTE — Telephone Encounter (Signed)
I talk with patient regarding schedule  

## 2019-04-12 ENCOUNTER — Encounter: Payer: Self-pay | Admitting: General Practice

## 2019-04-12 NOTE — Progress Notes (Signed)
[place] CSW Progress Notes  Spoke [employer] technologist - patient was participant in their congregate meals site.  During COVID, participants were offered delivery of 2 weeks of frozen meals during closure.  Amount of food delivered cannot be varied - patient will either receive 2 weeks worth of meals or none.  She will receive notification when congregate meal sites reopen.  [employer] is encouraging seniors to work w friend or family member to place online orders.  They have no other options to offer for home delivery of groceries or meals.  Meals on Wheels is on an 8 month wait list as well.  Edwyna Shell, LCSW Clinical Social Worker Phone:  [phone]

## 2019-04-16 ENCOUNTER — Encounter (HOSPITAL_COMMUNITY): Payer: Medicare HMO

## 2019-04-20 ENCOUNTER — Telehealth: Payer: Self-pay | Admitting: Hematology and Oncology

## 2019-04-20 ENCOUNTER — Encounter (HOSPITAL_COMMUNITY)
Admission: RE | Admit: 2019-04-20 | Discharge: 2019-04-20 | Disposition: A | Discharge status: Home / Self Care | Payer: Medicare HMO | Source: Ambulatory Visit | Attending: Hematology and Oncology | Admitting: Hematology and Oncology

## 2019-04-20 ENCOUNTER — Other Ambulatory Visit: Payer: Self-pay

## 2019-04-20 DIAGNOSIS — C50911 Malignant neoplasm of unspecified site of right female breast: Secondary | ICD-10-CM | POA: Diagnosis not present

## 2019-04-20 DIAGNOSIS — C7972 Secondary malignant neoplasm of left adrenal gland: Secondary | ICD-10-CM | POA: Diagnosis not present

## 2019-04-20 DIAGNOSIS — C50919 Malignant neoplasm of unspecified site of unspecified female breast: Secondary | ICD-10-CM | POA: Diagnosis not present

## 2019-04-20 DIAGNOSIS — C7802 Secondary malignant neoplasm of left lung: Secondary | ICD-10-CM | POA: Diagnosis not present

## 2019-04-20 DIAGNOSIS — C7801 Secondary malignant neoplasm of right lung: Secondary | ICD-10-CM | POA: Diagnosis not present

## 2019-04-20 DIAGNOSIS — R160 Hepatomegaly, not elsewhere classified: Secondary | ICD-10-CM | POA: Insufficient documentation

## 2019-04-20 DIAGNOSIS — C7951 Secondary malignant neoplasm of bone: Secondary | ICD-10-CM | POA: Insufficient documentation

## 2019-04-20 DIAGNOSIS — Z79899 Other long term (current) drug therapy: Secondary | ICD-10-CM | POA: Diagnosis not present

## 2019-04-20 LAB — GLUCOSE, CAPILLARY: Glucose-Capillary: 105 mg/dL — ABNORMAL HIGH (ref 70–99)

## 2019-04-20 MED ORDER — FLUDEOXYGLUCOSE F - 18 (FDG) INJECTION
9.6000 | Freq: Once | INTRAVENOUS | Status: AC
  Administered 2019-04-20: 9.6 via INTRAVENOUS

## 2019-04-20 NOTE — Telephone Encounter (Signed)
Spoke with patient re doximity video visit 6/22. Per patient she does not have cell/smart phone with camera capability and will come for in office visit. Message to provider.

## 2019-04-21 NOTE — Progress Notes (Signed)
Patient Care Team: [name], [name], MD as PCP - General (Internal Medicine) Curt Bears, MD (Hematology and Oncology) Paralee Cancel, MD (Orthopedic Surgery) Tyler Pita, MD (Radiation Oncology) Nicholas Lose, MD as Consulting Physician (Hematology and Oncology) Pleasant, Eppie Gibson, RN as [facility]  DIAGNOSIS:    ICD-10-CM   1. Swelling of arm  M79.89 CANCELED: UE VENOUS DUPLEX  2. Breast cancer metastasized to bone, right ([place])  C50.911 CBC with Differential ([place])   C79.51 CMP ([place] only)    SUMMARY OF ONCOLOGIC HISTORY: Oncology History  Breast cancer metastasized to bone, right ([place])  05/22/2001 Initial Diagnosis   Rt breast: stage IIb (T2, N1, M0) invasive right breast carcinoma with positive estrogen and progesterone receptor as well as HER-2/neu   06/12/2001 Surgery   right lumpectomy with right axillary lymph node dissection revealing 4/9 lymph nodes that were positive for malignancy.    08/12/2001 - 01/10/2002 Chemotherapy   Adj chemo CMF X 8    05/07/2002 - 12/14/2007 Anti-estrogen oral therapy   Adj Tamoxifen for 2 years and then switched to Letrozole (by Dr.Khan in Aug 2005), stopped Feb 2009   02/11/2003 - 04/05/2003 Radiation Therapy   Adj XRT by Tammi Klippel   03/18/2011 -  Anti-estrogen oral therapy   Anastrozole 1 mg daily   04/01/2011 Surgery   Left total hip replacement secondary to pathologic left femoral neck fracture   04/25/2017 Relapse/Recurrence   Anastrozole with Ibrance discontinued 05/30/2018 due to disease progression, Xgeva for bone metastases every 3 months   06/19/2018 Procedure   Liver biopsy: Metastatic breast cancer, ER 100%, PR 0%, Ki-67 50%, HER-2 negative   07/06/2018 Miscellaneous   Foundation 1 results: Microsatellite stable, tumor mutational burden 15 mutations per MB, ATRX, CCND1, CDK4 amplification, ERBB4 amplification, ESR1 amplification, FGFR 1, MDM2 amplification, MEM1, NST3  amplification, PIK3CA mutation, ZNF703 amplification; PDL 1: 0%   07/06/2018 -  Anti-estrogen oral therapy   Ibrance with Faslodex   01/26/2019 - 02/07/2019 Radiation Therapy   Palliative radiation to the neck and subclavian area   States she her pain  CHIEF COMPLIANT: Follow-up of metastatic breast cancer to review recent PET scan  INTERVAL HISTORY: [name] [name] is a 78 y.o. with above-mentioned history of metastatic breast cancerwith metastases to the liver, lung, and bonescurrently on Faslodex with Ibrance. PET scan on 04/20/19 revealed evidence of multiorgan progression of breast cancer: progression in the size of bilateral pulmonary nodules from 37m to 10 mm and 124mto 1475mincrease in skeletal metastasis, new left adrenal gland metastasis, and progression of the right hepatic lobe metastasis from 3cm to 13.7cm. She presents to the clinic today to review her recent scan and discuss different treatment options.   REVIEW OF SYSTEMS:   Constitutional: Denies fevers, chills or abnormal weight loss Eyes: Denies blurriness of vision Ears, nose, mouth, throat, and face: Denies mucositis or sore throat Respiratory: Denies cough, dyspnea or wheezes Cardiovascular: Denies palpitation, chest discomfort Gastrointestinal: Denies nausea, heartburn or change in bowel habits Skin: Denies abnormal skin rashes Lymphatics: Denies new lymphadenopathy or easy bruising Neurological: Denies numbness, tingling or new weaknesses Behavioral/Psych: Mood is stable, no new changes  Extremities: No lower extremity edema Breast: denies any pain or lumps or nodules in either breasts All other systems were reviewed with the patient and are negative.  I have reviewed the past medical history, past surgical history, social history and family history with the patient and they are unchanged from previous  note.  ALLERGIES:  has No Known Allergies.  MEDICATIONS:  Current Outpatient Medications  Medication Sig  Dispense Refill  . acetaminophen (TYLENOL) 500 MG tablet Take 500 mg by mouth daily as needed (arm pain).    Marland Kitchen alpelisib (PIQRAY 200MG DAILY DOSE) 200 MG Therapy Pack Take one 257m tablet with food at the same time daily. Swallow whole, do not crush, chew, or split. 30 each 3  . ALPRAZolam (XANAX) 0.5 MG tablet Take 1 tablet (0.5 mg total) by mouth 2 (two) times daily as needed for anxiety or sleep. 60 tablet 3  . amLODipine (NORVASC) 10 MG tablet Take 1 tablet (10 mg total) by mouth daily. 90 tablet 3  . anastrozole (ARIMIDEX) 1 MG tablet Take 1 tablet (1 mg total) by mouth daily. 90 tablet 3  . apixaban (ELIQUIS) 5 MG TABS tablet Take 1 tablet (5 mg total) by mouth 2 (two) times daily. Take 10 mg bid for 7 days orally and then decrease to 5 mg bid orally thereafter. 60 tablet 5  . Cholecalciferol (VITAMIN D3) 1000 UNITS CAPS Take 1 capsule by mouth daily.     .Marland Kitchengabapentin (NEURONTIN) 100 MG capsule Take 1 capsule (100 mg total) by mouth at bedtime. 30 capsule 5  . hydrochlorothiazide (HYDRODIURIL) 25 MG tablet Take 25 mg by mouth daily.  3  . losartan (COZAAR) 100 MG tablet Take 100 mg by mouth daily.  3  . meclizine (ANTIVERT) 12.5 MG tablet Take 1 tablet (12.5 mg total) by mouth 2 (two) times daily as needed for dizziness. 10 tablet 0  . nystatin (MYCOSTATIN/NYSTOP) powder Apply 1 Dose topically daily.  3  . Zoledronic Acid (ZOMETA) 4 MG/100ML IVPB Inject 4 mg into the vein every 3 (three) months.     No current facility-administered medications for this visit.     PHYSICAL EXAMINATION: ECOG PERFORMANCE STATUS: 1 - Symptomatic but completely ambulatory  Vitals:   04/23/19 1100  BP: (!) 140/54  Pulse: 76  Resp: 18  Temp: 98.4 F (36.9 C)  SpO2: 98%   Filed Weights   04/23/19 1100  Weight: 196 lb 1.6 oz (89 kg)    Right arm swelling and right finger neuropathy and tenderness of the tips of the fingers. Rest of the physical exam not done due to COVID-19 precautions.   LABORATORY DATA:  I have reviewed the data as listed CMP Latest Ref Rng & Units 03/13/2019 12/20/2018 12/13/2018  Glucose 70 - 99 mg/dL 103(H) 98 101(H)  BUN 8 - 23 mg/dL 24(H) 17 24(H)  Creatinine 0.44 - 1.00 mg/dL 1.96(H) 1.90(H) 2.15(H)  Sodium 135 - 145 mmol/L 137 140 140  Potassium 3.5 - 5.1 mmol/L 3.6 3.9 3.7  Chloride 98 - 111 mmol/L 104 106 105  CO2 22 - 32 mmol/L 22 24 24   Calcium 8.9 - 10.3 mg/dL 9.6 8.9 9.8  Total Protein 6.5 - 8.1 g/dL 8.1 7.8 -  Total Bilirubin 0.3 - 1.2 mg/dL 0.4 0.3 -  Alkaline Phos 38 - 126 U/L 79 73 -  AST 15 - 41 U/L 13(L) 19 -  ALT 0 - 44 U/L 8 7 -    Lab Results  Component Value Date   WBC 2.8 (L) 03/13/2019   HGB 9.0 (L) 03/13/2019   HCT 28.2 (L) 03/13/2019   MCV 108.9 (H) 03/13/2019   PLT 130 (L) 03/13/2019   NEUTROABS 1.6 (L) 03/13/2019    ASSESSMENT & PLAN:  Breast cancer metastasized to bone, right ([facility]) Metastatic breast cancer  with bone metastases along with lung and liver metastases, progressed on Ibrance with anastrozole.  Radiology review: 05/23/2018 CT CAP:New liver metastases 3.1 cm,increase in lung nodules, increase in sclerotic lesion of T11 and T9and S1 vertebral body. Liver biopsy 06/19/2018: Metastatic breast cancer ER 100%, PR 0%, Ki-67 50%, HER-2 negative BRCA1 mutation: Negative  PDL 1: 0%and foundation 1:PI 3 kinase mutation was identified ----------------------------------------------------------------------------------------------------------------------------------------------------------------------- Current treatment: Faslodex with Ibrance started8/29/2019  Patient's insurance rejected Xgeva.OnZometa every 3 months.   Palliative radiation: 01/25/2019-02/07/2019  PET CT scan 04/20/2019: Large hypermetabolic mass in the right hepatic lobe increase in size from 3 cm to 13.7 x 10.1 cm with SUV of 14.5, diffuse hypermetabolic bone metastases also increased, left adrenal gland metastatic lesion, increase in size  of bilateral lung nodules  Because of evidence of progression of disease, I recommended switching her treatment to Alpelisib Alpelisib (Piqray) Counseling: Alpelisib was studied in SOLAR-1 clinical trial: 572 patients postmenopausal woman who progressed on hormonal therapy were randomized to Fulvestrant + Alpelisib vs Fulvestrant. PFS was 11 months versus 5.7 months in patients who had PIK3CA mutation. Common side effects of Piqray are high blood sugar levels, increase in creatinine, diarrhea, rash, decrease in lymphocyte count, elevated liver enzymes, nausea, fatigue, anemia, increase in lipase, decreased appetite, stomatitis, vomiting, weight loss, low calcium levels, aPTT prolonged, and hair loss.  Return to clinic in 2 weeks after starting treatment for labs and follow-up I also requested her participation in a blood draw study for metastatic breast cancer and she is agreeable. Arm swelling: We will get ultrasound of the arm.    Orders Placed This Encounter  Procedures  . CBC with Differential (Cancer Center Only)    Standing Status:   Future    Standing Expiration Date:   04/22/2020  . CMP ([facility] [facility] only)    Standing Status:   Future    Standing Expiration Date:   04/22/2020   The patient has a good understanding of the overall plan. she agrees with it. she will call with any problems that may develop before the next visit here.  Nicholas Lose, MD 04/23/2019  Julious Oka Dorshimer am acting as scribe for Dr. Nicholas Lose.  I have reviewed the above documentation for accuracy and completeness, and I agree with the above.

## 2019-04-23 ENCOUNTER — Other Ambulatory Visit: Payer: Self-pay | Admitting: Adult Health

## 2019-04-23 ENCOUNTER — Inpatient Hospital Stay (HOSPITAL_BASED_OUTPATIENT_CLINIC_OR_DEPARTMENT_OTHER): Payer: Medicare HMO | Admitting: Hematology and Oncology

## 2019-04-23 ENCOUNTER — Other Ambulatory Visit: Payer: Self-pay

## 2019-04-23 ENCOUNTER — Telehealth: Payer: Self-pay

## 2019-04-23 ENCOUNTER — Other Ambulatory Visit: Payer: Self-pay | Admitting: *Deleted

## 2019-04-23 VITALS — BP 140/54 | HR 76 | Temp 98.4°F | Resp 18 | Ht 63.0 in | Wt 196.1 lb

## 2019-04-23 DIAGNOSIS — K219 Gastro-esophageal reflux disease without esophagitis: Secondary | ICD-10-CM

## 2019-04-23 DIAGNOSIS — Z79818 Long term (current) use of other agents affecting estrogen receptors and estrogen levels: Secondary | ICD-10-CM

## 2019-04-23 DIAGNOSIS — M6281 Muscle weakness (generalized): Secondary | ICD-10-CM | POA: Diagnosis not present

## 2019-04-23 DIAGNOSIS — C7801 Secondary malignant neoplasm of right lung: Secondary | ICD-10-CM

## 2019-04-23 DIAGNOSIS — C7802 Secondary malignant neoplasm of left lung: Secondary | ICD-10-CM

## 2019-04-23 DIAGNOSIS — M1990 Unspecified osteoarthritis, unspecified site: Secondary | ICD-10-CM

## 2019-04-23 DIAGNOSIS — Z79899 Other long term (current) drug therapy: Secondary | ICD-10-CM

## 2019-04-23 DIAGNOSIS — C50919 Malignant neoplasm of unspecified site of unspecified female breast: Secondary | ICD-10-CM

## 2019-04-23 DIAGNOSIS — Z9221 Personal history of antineoplastic chemotherapy: Secondary | ICD-10-CM

## 2019-04-23 DIAGNOSIS — E559 Vitamin D deficiency, unspecified: Secondary | ICD-10-CM

## 2019-04-23 DIAGNOSIS — C7951 Secondary malignant neoplasm of bone: Secondary | ICD-10-CM

## 2019-04-23 DIAGNOSIS — C7972 Secondary malignant neoplasm of left adrenal gland: Secondary | ICD-10-CM

## 2019-04-23 DIAGNOSIS — M7989 Other specified soft tissue disorders: Secondary | ICD-10-CM

## 2019-04-23 DIAGNOSIS — C787 Secondary malignant neoplasm of liver and intrahepatic bile duct: Secondary | ICD-10-CM | POA: Diagnosis not present

## 2019-04-23 DIAGNOSIS — Z86718 Personal history of other venous thrombosis and embolism: Secondary | ICD-10-CM

## 2019-04-23 DIAGNOSIS — C50911 Malignant neoplasm of unspecified site of right female breast: Secondary | ICD-10-CM

## 2019-04-23 DIAGNOSIS — F419 Anxiety disorder, unspecified: Secondary | ICD-10-CM

## 2019-04-23 DIAGNOSIS — Z8673 Personal history of transient ischemic attack (TIA), and cerebral infarction without residual deficits: Secondary | ICD-10-CM

## 2019-04-23 DIAGNOSIS — Z170 Estrogen receptor positive status [ER+]: Secondary | ICD-10-CM | POA: Diagnosis not present

## 2019-04-23 DIAGNOSIS — C773 Secondary and unspecified malignant neoplasm of axilla and upper limb lymph nodes: Secondary | ICD-10-CM

## 2019-04-23 DIAGNOSIS — E785 Hyperlipidemia, unspecified: Secondary | ICD-10-CM

## 2019-04-23 DIAGNOSIS — I10 Essential (primary) hypertension: Secondary | ICD-10-CM

## 2019-04-23 DIAGNOSIS — Z87891 Personal history of nicotine dependence: Secondary | ICD-10-CM

## 2019-04-23 DIAGNOSIS — Z923 Personal history of irradiation: Secondary | ICD-10-CM

## 2019-04-23 DIAGNOSIS — Z79811 Long term (current) use of aromatase inhibitors: Secondary | ICD-10-CM

## 2019-04-23 MED ORDER — ALPELISIB (200 MG DAILY DOSE) 200 MG PO TBPK: ORAL_TABLET | ORAL | 3 refills | Status: DC

## 2019-04-23 NOTE — Assessment & Plan Note (Signed)
Metastatic breast cancer with bone metastases along with lung and liver metastases, progressed on Ibrance with anastrozole.  Radiology review: 05/23/2018 CT CAP:New liver metastases 3.1 cm,increase in lung nodules, increase in sclerotic lesion of T11 and T9and S1 vertebral body. Liver biopsy 06/19/2018: Metastatic breast cancer ER 100%, PR 0%, Ki-67 50%, HER-2 negative BRCA1 mutation: Negative  PDL 1: 0%and foundation 1:PI 3 kinase mutation was identified ----------------------------------------------------------------------------------------------------------------------------------------------------------------------- Current treatment: Faslodex with Ibrance started8/29/2019  Patient's insurance rejected Xgeva.OnZometa every 3 months.   Palliative radiation: 01/25/2019-02/07/2019  PET CT scan 04/20/2019: Large hypermetabolic mass in the right hepatic lobe increase in size from 3 cm to 13.7 x 10.1 cm with SUV of 14.5, diffuse hypermetabolic bone metastases also increased, left adrenal gland metastatic lesion, increase in size of bilateral lung nodules  Because of evidence of progression of disease, I recommended switching her treatment to Alpelisib Alpelisib (Piqray) Counseling: Alpelisib was studied in SOLAR-1 clinical trial: 572 patients postmenopausal woman who progressed on hormonal therapy were randomized to Fulvestrant + Alpelisib vs Fulvestrant. PFS was 11 months versus 5.7 months in patients who had PIK3CA mutation. Common side effects of Piqray are high blood sugar levels, increase in creatinine, diarrhea, rash, decrease in lymphocyte count, elevated liver enzymes, nausea, fatigue, anemia, increase in lipase, decreased appetite, stomatitis, vomiting, weight loss, low calcium levels, aPTT prolonged, and hair loss.  Return to clinic in 2 weeks after starting treatment for labs and follow-up I also requested her participation in a blood draw study for metastatic breast cancer and she  is agreeable.  

## 2019-04-23 NOTE — Telephone Encounter (Signed)
Oral Oncology Patient Advocate Encounter  Prior Authorization for [name] [name] has been approved.    PA# [id number] Effective dates: 11/01/18 through 11/01/19  Oral Oncology Clinic will continue to follow.   [name] Phone [phone] Fax [phone] 04/23/2019    3:55 PM

## 2019-04-23 NOTE — Telephone Encounter (Signed)
Oral Oncology Patient Advocate Encounter  Received notification from [facility] that prior authorization for [name] [name] is required.  PA submitted on CoverMyMeds [facility] Status is pending  Oral Oncology Clinic will continue to follow.  [place] Patient [name] Phone [phone] Fax [phone] 04/23/2019    3:51 PM

## 2019-04-24 ENCOUNTER — Inpatient Hospital Stay: Payer: Medicare HMO

## 2019-04-24 ENCOUNTER — Telehealth: Payer: Self-pay | Admitting: Pharmacist

## 2019-04-24 ENCOUNTER — Other Ambulatory Visit: Payer: Self-pay

## 2019-04-24 ENCOUNTER — Ambulatory Visit (HOSPITAL_COMMUNITY)
Admission: RE | Admit: 2019-04-24 | Discharge: 2019-04-24 | Disposition: A | Discharge status: Home / Self Care | Payer: Medicare HMO | Source: Ambulatory Visit | Attending: Hematology and Oncology | Admitting: Hematology and Oncology

## 2019-04-24 DIAGNOSIS — M7989 Other specified soft tissue disorders: Secondary | ICD-10-CM | POA: Diagnosis not present

## 2019-04-24 DIAGNOSIS — C773 Secondary and unspecified malignant neoplasm of axilla and upper limb lymph nodes: Secondary | ICD-10-CM | POA: Diagnosis not present

## 2019-04-24 DIAGNOSIS — C7801 Secondary malignant neoplasm of right lung: Secondary | ICD-10-CM | POA: Diagnosis not present

## 2019-04-24 DIAGNOSIS — C50911 Malignant neoplasm of unspecified site of right female breast: Secondary | ICD-10-CM

## 2019-04-24 DIAGNOSIS — Z79818 Long term (current) use of other agents affecting estrogen receptors and estrogen levels: Secondary | ICD-10-CM | POA: Diagnosis not present

## 2019-04-24 DIAGNOSIS — C7972 Secondary malignant neoplasm of left adrenal gland: Secondary | ICD-10-CM | POA: Diagnosis not present

## 2019-04-24 DIAGNOSIS — C787 Secondary malignant neoplasm of liver and intrahepatic bile duct: Secondary | ICD-10-CM | POA: Diagnosis not present

## 2019-04-24 DIAGNOSIS — Z170 Estrogen receptor positive status [ER+]: Secondary | ICD-10-CM | POA: Diagnosis not present

## 2019-04-24 DIAGNOSIS — C7951 Secondary malignant neoplasm of bone: Secondary | ICD-10-CM

## 2019-04-24 DIAGNOSIS — C7802 Secondary malignant neoplasm of left lung: Secondary | ICD-10-CM | POA: Diagnosis not present

## 2019-04-24 LAB — CBC WITH DIFFERENTIAL (CANCER CENTER ONLY)
Abs Immature Granulocytes: 0.01 10*3/uL (ref 0.00–0.07)
Basophils Absolute: 0 10*3/uL (ref 0.0–0.1)
Basophils Relative: 1 %
Eosinophils Absolute: 0 10*3/uL (ref 0.0–0.5)
Eosinophils Relative: 1 %
HCT: 24.9 % — ABNORMAL LOW (ref 36.0–46.0)
Hemoglobin: 8 g/dL — ABNORMAL LOW (ref 12.0–15.0)
Immature Granulocytes: 0 %
Lymphocytes Relative: 14 %
Lymphs Abs: 0.3 10*3/uL — ABNORMAL LOW (ref 0.7–4.0)
MCH: 36 pg — ABNORMAL HIGH (ref 26.0–34.0)
MCHC: 32.1 g/dL (ref 30.0–36.0)
MCV: 112.2 fL — ABNORMAL HIGH (ref 80.0–100.0)
Monocytes Absolute: 0.2 10*3/uL (ref 0.1–1.0)
Monocytes Relative: 8 %
Neutro Abs: 1.8 10*3/uL (ref 1.7–7.7)
Neutrophils Relative %: 76 %
Platelet Count: 146 10*3/uL — ABNORMAL LOW (ref 150–400)
RBC: 2.22 MIL/uL — ABNORMAL LOW (ref 3.87–5.11)
RDW: 17.2 % — ABNORMAL HIGH (ref 11.5–15.5)
WBC Count: 2.3 10*3/uL — ABNORMAL LOW (ref 4.0–10.5)
nRBC: 0 % (ref 0.0–0.2)

## 2019-04-24 LAB — CMP (CANCER CENTER ONLY)
ALT: 8 U/L (ref 0–44)
AST: 20 U/L (ref 15–41)
Albumin: 3.5 g/dL (ref 3.5–5.0)
Alkaline Phosphatase: 71 U/L (ref 38–126)
Anion gap: 10 (ref 5–15)
BUN: 19 mg/dL (ref 8–23)
CO2: 21 mmol/L — ABNORMAL LOW (ref 22–32)
Calcium: 9.1 mg/dL (ref 8.9–10.3)
Chloride: 109 mmol/L (ref 98–111)
Creatinine: 2.05 mg/dL — ABNORMAL HIGH (ref 0.44–1.00)
GFR, Est AFR Am: 27 mL/min — ABNORMAL LOW (ref >60)
GFR, Estimated: 23 mL/min — ABNORMAL LOW (ref >60)
Glucose, Bld: 108 mg/dL — ABNORMAL HIGH (ref 70–99)
Potassium: 4 mmol/L (ref 3.5–5.1)
Sodium: 140 mmol/L (ref 135–145)
Total Bilirubin: 0.4 mg/dL (ref 0.3–1.2)
Total Protein: 7.9 g/dL (ref 6.5–8.1)

## 2019-04-24 LAB — RESEARCH LABS

## 2019-04-24 NOTE — Progress Notes (Signed)
Upper extremity venous  has been completed.   Preliminary results in CV Proc.   Results given to Merleen Nicely, Therapist, sports.  Abram Sander 04/24/2019 11:09 AM

## 2019-04-24 NOTE — Telephone Encounter (Signed)
Oral Oncology Pharmacist Encounter  Received new prescription for Piqray (alpelisib) for the treatment of metastatic, hormone receptor positive breast cancer with PIK3CA mutation in conjunction with fulvestrant injections, planned duration until disease progression or unacceptable toxicity.  Piqray is planned to be initiated at 266m by mouth once daily with possible up-titration to target dose of 3063monce daily in the future, based on toleration.  Labs from 04/24/19 assessed Noted SCr=2.05, est CrCl ~ 30 mL/min No dose adjustment per manufacturer for renal impairment Noted baseline mild hyperglycemia, will be discussed with MD. No baseline HbA1c in Epic, will be monitored during PiSalungaherapy Noted mild thrombocytopenia, will be monitored during treatment  Current medication list in Epic reviewed, no DDIs with Piqray identified:  [facility].  [facility] for insurance authorization, copayment issues, initial counseling and start date.  JeJohny DrillingPharmD, BCPS, BCOP  04/24/2019 12:36 PM Oral Oncology Clinic [phone]

## 2019-04-25 ENCOUNTER — Other Ambulatory Visit: Payer: Self-pay | Admitting: *Deleted

## 2019-04-25 ENCOUNTER — Other Ambulatory Visit: Payer: Self-pay | Admitting: Hematology and Oncology

## 2019-04-25 NOTE — Patient Outreach (Signed)
[facility]) Care Management  04/25/2019   [name] [name] [birth year] [phone]  RN Health Coach telephone call to patient.  [employer] compliance verified. Per patient she just found out that her cancer has come back. Per patient it has mets to the liver stage 4. Patient stated the Dr is going to try another drug. Per patient her blood pressure is doing pretty good. Patient stated that she feels tired and doesn't have much appetite. Per patient her arm is still swollen and they know that she has a clot. She has broken her recliner and needs to get it fixed or a lift chair. Per patient she also needs a rollator. She has a friend that always gets the latest products so she thinks she might be able to get a rollator. Patient went and picked up a regular walker yesterday thinking it would be a rollator. Patient has not moved to there new apartment yet. The person that was in It had to be evicted and the courts opened up this week. Patient is looking at moving in 2 weeks. RN discussed with patient about talking with the Dr about possible palliative care or Hospice. Patient is wanting a care giver to come in and assist her. Patient will talk with oncologist next visit. Patient has agreed to follow up outreach calls.    Current Medications:  Current Outpatient Medications  Medication Sig Dispense Refill  . acetaminophen (TYLENOL) 500 MG tablet Take 500 mg by mouth daily as needed (arm pain).    Marland Kitchen alpelisib (PIQRAY 200MG  DAILY DOSE) 200 MG Therapy Pack Take one 200mg  tablet with food at the same time daily. Swallow whole, do not crush, chew, or split. 30 each 3  . ALPRAZolam (XANAX) 0.5 MG tablet Take 1 tablet (0.5 mg total) by mouth 2 (two) times daily as needed for anxiety or sleep. 60 tablet 3  . amLODipine (NORVASC) 10 MG tablet Take 1 tablet (10 mg total) by mouth daily. 90 tablet 3  . apixaban (ELIQUIS) 5 MG TABS tablet Take 1 tablet (5 mg total) by mouth 2 (two) times daily. Take 10  mg bid for 7 days orally and then decrease to 5 mg bid orally thereafter. 60 tablet 5  . Cholecalciferol (VITAMIN D3) 1000 UNITS CAPS Take 1 capsule by mouth daily.     Marland Kitchen gabapentin (NEURONTIN) 100 MG capsule Take 1 capsule (100 mg total) by mouth at bedtime. 30 capsule 5  . hydrochlorothiazide (HYDRODIURIL) 25 MG tablet Take 25 mg by mouth daily.  3  . losartan (COZAAR) 100 MG tablet Take 100 mg by mouth daily.  3  . meclizine (ANTIVERT) 12.5 MG tablet Take 1 tablet (12.5 mg total) by mouth 2 (two) times daily as needed for dizziness. 10 tablet 0  . nystatin (MYCOSTATIN/NYSTOP) powder Apply 1 Dose topically daily.  3  . Zoledronic Acid (ZOMETA) 4 MG/100ML IVPB Inject 4 mg into the vein every 3 (three) months.     No current facility-administered medications for this visit.     Functional Status:  In your present state of health, do you have any difficulty performing the following activities: 04/25/2019 01/02/2019  Hearing? N N  Vision? N N  Difficulty concentrating or making decisions? Y N  Walking or climbing stairs? Y N  Comment uses a walker -  Dressing or bathing? Y N  Comment rt arm is limp -  Doing errands, shopping? Y N  Preparing Food and eating ? Y N  Using the Toilet?  Y N  In the past six months, have you accidently leaked urine? N N  Do you have problems with loss of bowel control? N N  Managing your Medications? N N  Managing your Finances? N N  Housekeeping or managing your Housekeeping? Y N  Some recent data might be hidden    Fall/Depression Screening: Fall Risk  04/25/2019 01/25/2019 01/02/2019  Falls in the past year? 1 1 1   Comment - - -  Number falls in past yr: 1 1 1   Injury with Fall? 0 0 0  Risk for fall due to : History of fall(s);Impaired balance/gait;Impaired mobility History of fall(s);Impaired balance/gait;Impaired mobility Impaired balance/gait;History of fall(s);Impaired mobility  Risk for fall due to: Comment - - -  Follow up Falls evaluation  completed;Education provided;Falls prevention discussed Falls evaluation completed;Falls prevention discussed Education provided;Falls prevention discussed   PHQ 2/9 Scores 04/25/2019 01/25/2019 01/02/2019 12/28/2018 11/30/2018 07/21/2018 07/20/2018  PHQ - 2 Score 1 1 1 2 2 2 2   PHQ- 9 Score - 7 - 9 9 - 9   THN CM Care Plan Problem One     Most Recent Value  Care Plan Problem One  Knowledge Deficit in Self Management of Hypertension  Role Documenting the Problem One  [place] for Problem One  Active  THN Long Term Goal   Patient will look at health maintenace within the next 90 days  THN Long Term Goal Start Date  04/25/19  Interventions for Problem One Long Term Goal  Rn discussed rescheduling appointments that was cancelled due to virus. RN will follow up with further discussion  THN CM Short Term Goal #1   Patient will discuss with physician about palliatuive care and hospice within the next 30 days  THN CM Short Term Goal #1 Start Date  04/25/19  Interventions for Short Term Goal #1  with patient about talking with physician about hospice and palliative care. RN will follow up with further discussion  THN CM Short Term Goal #2   Patient will verbalize she has gotten a new rollator and lift chair.  THN CM Short Term Goal #2 Start Date  04/25/19  Interventions for Short Term Goal #2  Patient has indicated that she would like a lift chair and rollator. RN discussed places that she could get them. Patient has a friend that might have the additional equipment she wants.RN will follow up for further discussion.      Assessment:  Patient has poor appetite Patient has swelling in right are Cancer is mets to the liver and is stage 4 Patient is taking medications as per ordered  Plan:  Patient will discuss palliative care and hospice with Oncologist RN discussed advance directive Patient has papers but not filled out yet Patient will possibly move within the next 2 weeks Patient  will talk with friend about rollator walker RN will follow up with in the month of July  [name] [name] [place] Management [phone]

## 2019-04-26 ENCOUNTER — Ambulatory Visit: Payer: Self-pay | Admitting: *Deleted

## 2019-04-26 ENCOUNTER — Other Ambulatory Visit: Payer: Self-pay | Admitting: *Deleted

## 2019-04-26 ENCOUNTER — Telehealth: Payer: Self-pay | Admitting: Hematology and Oncology

## 2019-04-26 DIAGNOSIS — C7951 Secondary malignant neoplasm of bone: Secondary | ICD-10-CM

## 2019-04-26 DIAGNOSIS — C50911 Malignant neoplasm of unspecified site of right female breast: Secondary | ICD-10-CM

## 2019-04-26 MED ORDER — PIOGLITAZONE HCL 15 MG PO TABS: 15.0000 mg | ORAL_TABLET | Freq: Every day | ORAL | 3 refills | Status: AC

## 2019-04-26 MED FILL — PIOGLITAZONE HCL 15 MG TAB: 15 | 30 days supply | Qty: 30 | Fill #0

## 2019-04-26 NOTE — Telephone Encounter (Signed)
Oral Oncology Pharmacist Encounter  Discussed management of baseline elevated blood sugars with MD. With borderline renal function, metformin is not an ideal anti-glycemic agent for this patient due to risk of lactic acidosis. Will initiate pioglitazone at 15mg  by mouth once daily that can be up-titrated to 45mg  once daily for increased blood glucose control if needed. Prescription has been sent to the [facility] outpatient pharmacy. Patient will be referred by MD to dietician to discuss diabetic diet, as diet control has shown to aid in blood glucose management as a result of [place] treatment.  Will also counsel patient to initiate cetirizine for prevention of rash.  Johny Drilling, PharmD, BCPS, BCOP  04/26/2019 1:50 PM Oral Oncology Clinic [phone]

## 2019-04-26 NOTE — Telephone Encounter (Signed)
I could not reach patient °

## 2019-04-26 NOTE — Telephone Encounter (Signed)
Oral Chemotherapy Pharmacist Encounter   I spoke with patient for overview of new oral chemotherapy medication: Piqray (alpelisib) for the treatment of metastatic, hormone receptor positive breast cancer, previously treated with endocrine based therapy, with known PIK3CA mutation, in conjunction with Faslodex, planned duration until disease progression or unacceptable toxicity.  Counseled on administration, dosing, side effects, monitoring, drug-food interactions, safe handling, storage, and disposal.  Patient will take Piqray 225m tablets, 1 tablet (200 mg) by mouth once daily with food.  Piqray start date: 05/01/2019  Side effects include but not limited to: hyperglycemia, rash/hypersensitivity reaction, diarrhea, fatigue, nausea, vomiting, mouth sores, alopecia, taste changes, decreased appetite, severe lung reactions, and arthralgias.  Patient has anti-emetic on hand and knows to take it if nausea develops.   Patient will obtain anti diarrheal and alert the office of 4 or more loose stools above baseline.  Discussed with patient need for glucose control prior to PWelchinitiation, and possible need for anti-diabetic agents for the treatment of medication-induced hyperglycemia while on treatment with Piqray.    Discussed prescription for pioglitazone 136monce daily that may be increased in the future based on blood glucose control. Patient will meet with [facility] dietician on 05/08/19 to discuss initiation of diabetic diet to increase possibility of blood glucose control.  Discussed possibility of severe cutaneous reactions with Piqray. Patient will alert the office of any dermatologic toxicity that may occur after Piqray initiation. She will initiate cetirizine 1039mnce daily on the same day she starts PiqDavidsonReviewed with patient importance of keeping a medication schedule and plan for any missed doses.  Medication reconciliation performed and medication/allergy list  updated.  Insurance authorization for PiqJoylene Drafts been obtained. Test claim at the pharmacy revealed copayment $858.51 for 1st fill of Piqray. This will be fully covered by previously secured copayment grant so patient's out of pocket expense for [facility]qMiss[facility] University[facility] be $0. Copayment for 1st fill of pioglitazone is $3.60 90 tablets of cetirizine 76m56m $5.55  Patient will pick up 1st fill of all 3 medications [place].  Patient informed the pharmacy will reach out 5-7 days prior to needing next fill of Piqray to coordinate continued medication acquisition to prevent break in therapy.  I will bring written information about medication regimen to the pharmacy on 04/27/19 to place with her medications for dispense on Monday.  All questions answered.  Ms. RoysMcdougallced understanding and appreciation.   Patient knows to call the office with questions or concerns.  JessJohny DrillingarmD, BCPS, BCOP  04/26/2019   2:19 PM Oral Oncology Clinic [phone]

## 2019-04-30 MED FILL — PIQRAY (200 MG DAILY DOSE): 200 | 28 days supply | Qty: 28 | Fill #0

## 2019-05-01 NOTE — Telephone Encounter (Signed)
Oral Oncology Patient Advocate Encounter  Confirmed with [place] that [facility] and Pioglitazone was picked up on 05/01/19.   [place] Patient [name] [name] Phone [phone] Fax [phone] 05/01/2019   2:17 PM

## 2019-05-01 NOTE — Assessment & Plan Note (Signed)
Metastatic breast cancer with bone metastases along with lung and liver metastases, progressed on Ibrance with anastrozole. Liver biopsy 06/19/2018: Metastatic breast cancer ER 100%, PR 0%, Ki-67 50%, HER-2 negative BRCA1 mutation: Negative  PDL 1: 0%and foundation 1:PI 3 kinase mutation was identified Faslodex with Ibrance started8/29/2019-04/23/2019 Palliative radiation: 01/25/2019-02/07/2019 ----------------------------------------------------------------------------------------------------------------------------------------------------------------------- Current treatment: Alpelisib with Faslodex Patient's insurance rejected Xgeva.OnZometa every 3 months.    PET CT scan 04/20/2019: Large hypermetabolic mass in the right hepatic lobe increase in size from 3 cm to 13.7 x 10.1 cm with SUV of 14.5, diffuse hypermetabolic bone metastases also increased, left adrenal gland metastatic lesion, increase in size of bilateral lung nodules  Alpelisib toxicities: Monitoring very closely for blood sugars  Return to clinic in 2 weeks with labs and follow-up

## 2019-05-07 NOTE — Progress Notes (Signed)
Patient Care Team: [name], [name], MD as PCP - General (Internal Medicine) Curt Bears, MD (Hematology and Oncology) Paralee Cancel, MD (Orthopedic Surgery) Tyler Pita, MD (Radiation Oncology) Nicholas Lose, MD as Consulting Physician (Hematology and Oncology) Pleasant, Eppie Gibson, RN as [place] [facility]  DIAGNOSIS:    ICD-10-CM   1. Breast cancer metastasized to bone, right ([place])  C50.911 CBC with Differential ([place])   C79.51 CMP ([place] only)    Lipid panel    SUMMARY OF ONCOLOGIC HISTORY: Oncology History  Breast cancer metastasized to bone, right ([facility])  05/22/2001 Initial Diagnosis   Rt breast: stage IIb (T2, N1, M0) invasive right breast carcinoma with positive estrogen and progesterone receptor as well as HER-2/neu   06/12/2001 Surgery   right lumpectomy with right axillary lymph node dissection revealing 4/9 lymph nodes that were positive for malignancy.    08/12/2001 - 01/10/2002 Chemotherapy   Adj chemo CMF X 8    05/07/2002 - 12/14/2007 Anti-estrogen oral therapy   Adj Tamoxifen for 2 years and then switched to Letrozole (by Dr.Khan in Aug 2005), stopped Feb 2009   02/11/2003 - 04/05/2003 Radiation Therapy   Adj XRT by Tammi Klippel   03/18/2011 -  Anti-estrogen oral therapy   Anastrozole 1 mg daily   04/01/2011 Surgery   Left total hip replacement secondary to pathologic left femoral neck fracture   04/25/2017 Relapse/Recurrence   Anastrozole with Ibrance discontinued 05/30/2018 due to disease progression, Xgeva for bone metastases every 3 months   06/19/2018 Procedure   Liver biopsy: Metastatic breast cancer, ER 100%, PR 0%, Ki-67 50%, HER-2 negative   07/06/2018 Miscellaneous   Foundation 1 results: Microsatellite stable, tumor mutational burden 15 mutations per MB, ATRX, CCND1, CDK4 amplification, ERBB4 amplification, ESR1 amplification, FGFR 1, MDM2 amplification, MEM1, NST3 amplification, PIK3CA mutation, [id number]  amplification; PDL 1: 0%   07/06/2018 -  Anti-estrogen oral therapy   Ibrance with Faslodex   01/26/2019 - 02/07/2019 Radiation Therapy   Palliative radiation to the neck and subclavian area   05/01/2019 Treatment Plan Change   Alpelisib 267m daily     CHIEF COMPLIANT: Follow-up of metastatic breast cancer on Alpelisib  INTERVAL HISTORY: [name] VANCAMPis a 78y.o. with above-mentioned history of metastatic breast cancerwith metastases to the liver, lung, and boneswho started treatment with Alpelisib on 05/01/19. She presents to the clinic today for a toxicity check.  She reports decreased appetite and losing weight.  She has been so fatigued that she has been sleeping most of the day or watching television.  She is unable to do a whole lot. Her sons are coming to help her move to a different apartment which would be more handicap accessible.  REVIEW OF SYSTEMS:   Constitutional: Denies fevers, chills or abnormal weight loss Eyes: Denies blurriness of vision Ears, nose, mouth, throat, and face: Denies mucositis or sore throat Respiratory: Denies cough, dyspnea or wheezes Cardiovascular: Denies palpitation, chest discomfort Gastrointestinal: Complains of diarrhea, lack of taste and appetite Skin: Denies abnormal skin rashes Lymphatics: Denies new lymphadenopathy or easy bruising Neurological: Denies numbness, tingling or new weaknesses Behavioral/Psych: Mood is stable, no new changes  Extremities: Severe pain in the right arm Breast: denies any pain or lumps or nodules in either breasts All other systems were reviewed with the patient and are negative.  I have reviewed the past medical history, past surgical history, social history and family history with the patient and they are unchanged from previous note.  ALLERGIES:  has No Known Allergies.  MEDICATIONS:  Current Outpatient Medications  Medication Sig Dispense Refill   acetaminophen (TYLENOL) 500 MG tablet Take 500 mg by  mouth daily as needed (arm pain).     alpelisib (PIQRAY 200MG DAILY DOSE) 200 MG Therapy Pack Take one 26m tablet with food at the same time daily. Swallow whole, do not crush, chew, or split. 30 each 3   ALPRAZolam (XANAX) 0.5 MG tablet Take 1 tablet (0.5 mg total) by mouth 2 (two) times daily as needed for anxiety or sleep. 60 tablet 3   amLODipine (NORVASC) 10 MG tablet Take 1 tablet (10 mg total) by mouth daily. 90 tablet 3   apixaban (ELIQUIS) 5 MG TABS tablet Take 1 tablet (5 mg total) by mouth 2 (two) times daily. Take 10 mg bid for 7 days orally and then decrease to 5 mg bid orally thereafter. 60 tablet 5   Cholecalciferol (VITAMIN D3) 1000 UNITS CAPS Take 1 capsule by mouth daily.      dexamethasone (DECADRON) 1 MG tablet Take 1 tablet (1 mg total) by mouth daily. 30 tablet 3   gabapentin (NEURONTIN) 100 MG capsule Take 1 capsule (100 mg total) by mouth at bedtime. 30 capsule 5   hydrochlorothiazide (HYDRODIURIL) 25 MG tablet Take 25 mg by mouth daily.  3   losartan (COZAAR) 100 MG tablet Take 100 mg by mouth daily.  3   meclizine (ANTIVERT) 12.5 MG tablet Take 1 tablet (12.5 mg total) by mouth 2 (two) times daily as needed for dizziness. 10 tablet 0   nystatin (MYCOSTATIN/NYSTOP) powder Apply 1 Dose topically daily.  3   pioglitazone (ACTOS) 15 MG tablet Take 1 tablet (15 mg total) by mouth daily. 30 tablet 3   Zoledronic Acid (ZOMETA) 4 MG/100ML IVPB Inject 4 mg into the vein every 3 (three) months.     No current facility-administered medications for this visit.     PHYSICAL EXAMINATION: ECOG PERFORMANCE STATUS: 1 - Symptomatic but completely ambulatory  Vitals:   05/08/19 0912  BP: (!) 110/55  Pulse: 86  Resp: 16  Temp: 99.1 F (37.3 C)  SpO2: 98%   Filed Weights   05/08/19 0912  Weight: 184 lb 1.6 oz (83.5 kg)    GENERAL: alert, no distress and comfortable SKIN: skin color, texture, turgor are normal, no rashes or significant lesions EYES: normal,  Conjunctiva are pink and non-injected, sclera clear OROPHARYNX: no exudate, no erythema and lips, buccal mucosa, and tongue normal  NECK: supple, thyroid normal size, non-tender, without nodularity LYMPH: no palpable lymphadenopathy in the cervical, axillary or inguinal LUNGS: clear to auscultation and percussion with normal breathing effort HEART: regular rate & rhythm and no murmurs and no lower extremity edema ABDOMEN: abdomen soft, non-tender and normal bowel sounds MUSCULOSKELETAL: no cyanosis of digits and no clubbing  NEURO: alert & oriented x 3 with fluent speech, no focal motor/sensory deficits EXTREMITIES: No lower extremity edema  LABORATORY DATA:  I have reviewed the data as listed CMP Latest Ref Rng & Units 04/24/2019 03/13/2019 12/20/2018  Glucose 70 - 99 mg/dL 108(H) 103(H) 98  BUN 8 - 23 mg/dL 19 24(H) 17  Creatinine 0.44 - 1.00 mg/dL 2.05(H) 1.96(H) 1.90(H)  Sodium 135 - 145 mmol/L 140 137 140  Potassium 3.5 - 5.1 mmol/L 4.0 3.6 3.9  Chloride 98 - 111 mmol/L 109 104 106  CO2 22 - 32 mmol/L 21(L) 22 24  Calcium 8.9 - 10.3 mg/dL 9.1 9.6 8.9  Total Protein  6.5 - 8.1 g/dL 7.9 8.1 7.8  Total Bilirubin 0.3 - 1.2 mg/dL 0.4 0.4 0.3  Alkaline Phos 38 - 126 U/L 71 79 73  AST 15 - 41 U/L 20 13(L) 19  ALT 0 - 44 U/L 8 8 7     Lab Results  Component Value Date   WBC 2.3 (L) 04/24/2019   HGB 8.0 (L) 04/24/2019   HCT 24.9 (L) 04/24/2019   MCV 112.2 (H) 04/24/2019   PLT 146 (L) 04/24/2019   NEUTROABS 1.8 04/24/2019    ASSESSMENT & PLAN:  Breast cancer metastasized to bone, right ([facility]) Metastatic breast cancer with bone metastases along with lung and liver metastases, progressed on Ibrance with anastrozole. Liver biopsy 06/19/2018: Metastatic breast cancer ER 100%, PR 0%, Ki-67 50%, HER-2 negative BRCA1 mutation: Negative  PDL 1: 0%and foundation 1:PI 3 kinase mutation was identified Faslodex with Ibrance started8/29/2019-04/23/2019 Palliative radiation:  01/25/2019-02/07/2019 ----------------------------------------------------------------------------------------------------------------------------------------------------------------------- Current treatment: Alpelisib with Faslodex Patient's insurance rejected Xgeva.OnZometa every 3 months.    PET CT scan 04/20/2019: Large hypermetabolic mass in the right hepatic lobe increase in size from 3 cm to 13.7 x 10.1 cm with SUV of 14.5, diffuse hypermetabolic bone metastases also increased, left adrenal gland metastatic lesion, increase in size of bilateral lung nodules  Alpelisib toxicities: 1.  Decreased appetite 2.  Severe fatigue 3.  Diarrhea: I instructed her to get some Imodium.  Patient's son sent FMLA paperwork for [place] to fill. Profound pain in the right arm: April 2020 patient received radiation therapy to the right shoulder. I gave her a prescription for Rollator  Monitoring very closely for blood sugars Labs will be drawn today. She will receive an injection of Faslodex.  Decreased appetite: I prescribed her dexamethasone 1 mg daily. Return to clinic in 2 weeks with labs and follow-up    Orders Placed This Encounter  Procedures   CBC with Differential ([place] Only)    Standing Status:   Standing    Number of Occurrences:   10    Standing Expiration Date:   05/07/2020   CMP ([place] only)    Standing Status:   Standing    Number of Occurrences:   10    Standing Expiration Date:   05/07/2020   Lipid panel    Standing Status:   Standing    Number of Occurrences:   10    Standing Expiration Date:   05/07/2020   The patient has a good understanding of the overall plan. she agrees with it. she will call with any problems that may develop before the next visit here.  Nicholas Lose, MD 05/08/2019  Julious Oka Dorshimer am acting as scribe for Dr. Nicholas Lose.  I have reviewed the above documentation for accuracy and completeness, and I agree with the  above.

## 2019-05-08 ENCOUNTER — Inpatient Hospital Stay: Payer: Medicare HMO

## 2019-05-08 ENCOUNTER — Other Ambulatory Visit: Payer: Self-pay

## 2019-05-08 ENCOUNTER — Inpatient Hospital Stay: Payer: Medicare HMO | Attending: Oncology | Admitting: Hematology and Oncology

## 2019-05-08 ENCOUNTER — Inpatient Hospital Stay: Payer: Medicare HMO | Admitting: Nutrition

## 2019-05-08 ENCOUNTER — Inpatient Hospital Stay (HOSPITAL_BASED_OUTPATIENT_CLINIC_OR_DEPARTMENT_OTHER): Payer: Medicare HMO | Admitting: Medical

## 2019-05-08 VITALS — BP 143/53 | HR 80 | Resp 16

## 2019-05-08 VITALS — BP 137/68 | HR 75 | Temp 99.1°F

## 2019-05-08 DIAGNOSIS — Z95828 Presence of other vascular implants and grafts: Secondary | ICD-10-CM

## 2019-05-08 DIAGNOSIS — R634 Abnormal weight loss: Secondary | ICD-10-CM | POA: Insufficient documentation

## 2019-05-08 DIAGNOSIS — C50911 Malignant neoplasm of unspecified site of right female breast: Secondary | ICD-10-CM

## 2019-05-08 DIAGNOSIS — Z79818 Long term (current) use of other agents affecting estrogen receptors and estrogen levels: Secondary | ICD-10-CM

## 2019-05-08 DIAGNOSIS — C787 Secondary malignant neoplasm of liver and intrahepatic bile duct: Secondary | ICD-10-CM | POA: Diagnosis not present

## 2019-05-08 DIAGNOSIS — C7800 Secondary malignant neoplasm of unspecified lung: Secondary | ICD-10-CM | POA: Diagnosis not present

## 2019-05-08 DIAGNOSIS — Z923 Personal history of irradiation: Secondary | ICD-10-CM | POA: Diagnosis not present

## 2019-05-08 DIAGNOSIS — C7951 Secondary malignant neoplasm of bone: Secondary | ICD-10-CM | POA: Insufficient documentation

## 2019-05-08 DIAGNOSIS — R5383 Other fatigue: Secondary | ICD-10-CM | POA: Diagnosis not present

## 2019-05-08 DIAGNOSIS — Z79899 Other long term (current) drug therapy: Secondary | ICD-10-CM | POA: Diagnosis not present

## 2019-05-08 DIAGNOSIS — R197 Diarrhea, unspecified: Secondary | ICD-10-CM | POA: Insufficient documentation

## 2019-05-08 DIAGNOSIS — Z7901 Long term (current) use of anticoagulants: Secondary | ICD-10-CM | POA: Diagnosis not present

## 2019-05-08 DIAGNOSIS — Z79811 Long term (current) use of aromatase inhibitors: Secondary | ICD-10-CM | POA: Diagnosis not present

## 2019-05-08 DIAGNOSIS — Z170 Estrogen receptor positive status [ER+]: Secondary | ICD-10-CM | POA: Insufficient documentation

## 2019-05-08 DIAGNOSIS — R112 Nausea with vomiting, unspecified: Secondary | ICD-10-CM

## 2019-05-08 LAB — LIPID PANEL
Cholesterol: 180 mg/dL (ref 0–200)
HDL: 58 mg/dL (ref >40)
LDL Cholesterol: 105 mg/dL — ABNORMAL HIGH (ref 0–99)
Total CHOL/HDL Ratio: 3.1 RATIO
Triglycerides: 86 mg/dL (ref <150)
VLDL: 17 mg/dL (ref 0–40)

## 2019-05-08 LAB — CBC WITH DIFFERENTIAL (CANCER CENTER ONLY)
Abs Immature Granulocytes: 0.02 10*3/uL (ref 0.00–0.07)
Basophils Absolute: 0.1 10*3/uL (ref 0.0–0.1)
Basophils Relative: 1 %
Eosinophils Absolute: 0 10*3/uL (ref 0.0–0.5)
Eosinophils Relative: 0 %
HCT: 29.9 % — ABNORMAL LOW (ref 36.0–46.0)
Hemoglobin: 9.8 g/dL — ABNORMAL LOW (ref 12.0–15.0)
Immature Granulocytes: 0 %
Lymphocytes Relative: 12 %
Lymphs Abs: 0.7 10*3/uL (ref 0.7–4.0)
MCH: 35.8 pg — ABNORMAL HIGH (ref 26.0–34.0)
MCHC: 32.8 g/dL (ref 30.0–36.0)
MCV: 109.1 fL — ABNORMAL HIGH (ref 80.0–100.0)
Monocytes Absolute: 0.9 10*3/uL (ref 0.1–1.0)
Monocytes Relative: 15 %
Neutro Abs: 4.4 10*3/uL (ref 1.7–7.7)
Neutrophils Relative %: 72 %
Platelet Count: 211 10*3/uL (ref 150–400)
RBC: 2.74 MIL/uL — ABNORMAL LOW (ref 3.87–5.11)
RDW: 16.9 % — ABNORMAL HIGH (ref 11.5–15.5)
WBC Count: 6.1 10*3/uL (ref 4.0–10.5)
nRBC: 0 % (ref 0.0–0.2)

## 2019-05-08 LAB — CMP (CANCER CENTER ONLY)
ALT: 6 U/L (ref 0–44)
AST: 21 U/L (ref 15–41)
Albumin: 3.8 g/dL (ref 3.5–5.0)
Alkaline Phosphatase: 87 U/L (ref 38–126)
Anion gap: 14 (ref 5–15)
BUN: 21 mg/dL (ref 8–23)
CO2: 21 mmol/L — ABNORMAL LOW (ref 22–32)
Calcium: 9.2 mg/dL (ref 8.9–10.3)
Chloride: 102 mmol/L (ref 98–111)
Creatinine: 2.72 mg/dL — ABNORMAL HIGH (ref 0.44–1.00)
GFR, Est AFR Am: 19 mL/min — ABNORMAL LOW (ref >60)
GFR, Estimated: 16 mL/min — ABNORMAL LOW (ref >60)
Glucose, Bld: 169 mg/dL — ABNORMAL HIGH (ref 70–99)
Potassium: 4 mmol/L (ref 3.5–5.1)
Sodium: 137 mmol/L (ref 135–145)
Total Bilirubin: 0.4 mg/dL (ref 0.3–1.2)
Total Protein: 9 g/dL — ABNORMAL HIGH (ref 6.5–8.1)

## 2019-05-08 LAB — HEMOGLOBIN A1C
Hgb A1c MFr Bld: 5.8 % — ABNORMAL HIGH (ref 4.8–5.6)
Mean Plasma Glucose: 119.76 mg/dL

## 2019-05-08 MED ORDER — SODIUM CHLORIDE 0.9 % IV SOLN
Freq: Once | INTRAVENOUS | Status: AC
  Administered 2019-05-08: 13:00:00 via INTRAVENOUS
  Filled 2019-05-08: qty 250

## 2019-05-08 MED ORDER — DEXAMETHASONE 1 MG PO TABS: 1.0000 mg | ORAL_TABLET | Freq: Every day | ORAL | 3 refills | Status: DC

## 2019-05-08 MED ORDER — SODIUM CHLORIDE 0.9% FLUSH
10.0000 mL | Freq: Once | INTRAVENOUS | Status: AC | PRN
  Administered 2019-05-08: 10 mL
  Filled 2019-05-08: qty 10

## 2019-05-08 MED ORDER — FULVESTRANT 250 MG/5ML IM SOLN
500.0000 mg | Freq: Once | INTRAMUSCULAR | Status: AC
  Administered 2019-05-08: 500 mg via INTRAMUSCULAR

## 2019-05-08 MED ORDER — FULVESTRANT 250 MG/5ML IM SOLN
INTRAMUSCULAR | Status: AC
  Filled 2019-05-08: qty 5

## 2019-05-08 MED ORDER — SODIUM CHLORIDE 0.9 % IV SOLN
INTRAVENOUS | Status: AC
  Filled 2019-05-08: qty 250

## 2019-05-08 MED ORDER — HEPARIN SOD (PORK) LOCK FLUSH 100 UNIT/ML IV SOLN
500.0000 [IU] | Freq: Once | INTRAVENOUS | Status: AC | PRN
  Administered 2019-05-08: 14:00:00 500 [IU]
  Filled 2019-05-08: qty 5

## 2019-05-08 MED FILL — DEXAMETHASONE 1 MG TABLET: 1 | 30 days supply | Qty: 30 | Fill #0

## 2019-05-08 NOTE — Patient Instructions (Signed)

## 2019-05-08 NOTE — Progress Notes (Signed)
PT became dizzy while receiving injection, checked vitals and gave PT oj and crackers. She stated she hadn't had anything to eat since yesterday. I also had Lucianne Lei come and take a look at the PT and he advised [place] to just monitor vitals and let her drink and eat a small snack. Spoke with Kathlee Nations regarding labs that was scheduled for today and she stated to let the PT have them drawl today and she would make Dr Lindi Adie aware. Escorted the PT to lab and made them aware of the situation as well, instructed them to escort her to the lab  and let her sit after blood drawl if she was still dizzy.

## 2019-05-08 NOTE — Progress Notes (Signed)
RN received call from social work that patient was downstairs with SHOB, diarrhea X 3, and c/o not feeling well.  RN assessed patient.  Pt alert and oriented, clear speech.  Pt reports not eating or drinking much this AM.  Pulse rapid.    RN notified Dr. Lindi Adie and symptom management.  Verbal orders give for 1L IVF over 1 hour.  Pt notified, and pt's grand daughter aware.  Pt scheduled in symptom management.

## 2019-05-08 NOTE — Progress Notes (Signed)
Nutrition Assessment   Reason for Assessment:   Elevated blood glucose   ASSESSMENT:   78 year old female with metastatic breast cancer to liver, bone and lung.  Patient currently on alpelisib on 05/01/19.    Spoke with patient in clinic this am.  Patient reports fatigue, decreased appetite for the last 1-1 1/2 weeks.  Reports that yesterday was only able to eat 1/2 apple and few bites of meatloaf, mashed potatoes, green beans and cornbread last night for dinner.  Reports that she is drinking some ensure orignial.  Reports that she is receiving meals from a local church.  Does not cook, lives alone and has hard time opening cans of foods.  Reports diarrhea about 2-3 times per day for the last few days.  Reports that she mainly drinks water and 1 drink of Brandy (1-2 oz) daily. Has not drank any brandy in the last 2 days due to not wanting it.       Nutrition Focused Physical Exam: deferred   Medications: decadron added today, imodium   Labs: glucose 169, creatinine 2.72   Anthropometrics:   Height: 63 inches Weight: 184 lb 1.6 oz UBW: 200 lb per patient.  Noted 196 lb on 6/22 BMI: 32  6% weight loss in the last 3 weeks, significant   Estimated Energy Needs  Kcals: 1900-2100 Protein: 84-100 g Fluid: > 1800 L   NUTRITION DIAGNOSIS: Inadequate oral intake related to cancer and cancer related treatment side effects as evidenced by 6% weight loss in 3 weeks   INTERVENTION:  Discussed briefly avoiding sugary beverages and concentrated sweets.   Concerned about overall very low intake and would not want to restrict diet.    Patient not feeling well during visit and Social Worker called RN, for patient to be seen in [facility].   RD assisted patient to bathroom times 2 during visit due to diarrhea.     MONITORING, EVALUATION, GOAL: Patient will consume adequate calories and protein to maintain weight loss.   Next Visit: Monday, July 13  Graball Zenia Resides, [place], [place] Registered  Dietitian [phone] (pager)

## 2019-05-08 NOTE — Patient Instructions (Signed)
Fulvestrant injection What is this medicine? FULVESTRANT (ful VES trant) blocks the effects of estrogen. It is used to treat breast cancer. This medicine may be used for other purposes; ask your health care provider or pharmacist if you have questions. COMMON BRAND NAME(S): FASLODEX What should I tell my health care provider before I take this medicine? They need to know if you have any of these conditions:  bleeding disorders  liver disease  low blood counts, like low white cell, platelet, or red cell counts  an unusual or allergic reaction to fulvestrant, other medicines, foods, dyes, or preservatives  pregnant or trying to get pregnant  breast-feeding How should I use this medicine? This medicine is for injection into a muscle. It is usually given by a health care professional in a hospital or clinic setting. Talk to your pediatrician regarding the use of this medicine in children. Special care may be needed. Overdosage: If you think you have taken too much of this medicine contact a poison control center or emergency room at once. NOTE: This medicine is only for you. Do not share this medicine with others. What if I miss a dose? It is important not to miss your dose. Call your doctor or health care professional if you are unable to keep an appointment. What may interact with this medicine?  medicines that treat or prevent blood clots like warfarin, enoxaparin, dalteparin, apixaban, dabigatran, and rivaroxaban This list may not describe all possible interactions. Give your health care provider a list of all the medicines, herbs, non-prescription drugs, or dietary supplements you use. Also tell them if you smoke, drink alcohol, or use illegal drugs. Some items may interact with your medicine. What should I watch for while using this medicine? Your condition will be monitored carefully while you are receiving this medicine. You will need important blood work done while you are taking  this medicine. Do not become pregnant while taking this medicine or for at least 1 year after stopping it. Women of child-bearing potential will need to have a negative pregnancy test before starting this medicine. Women should inform their doctor if they wish to become pregnant or think they might be pregnant. There is a potential for serious side effects to an unborn child. Men should inform their doctors if they wish to father a child. This medicine may lower sperm counts. Talk to your health care professional or pharmacist for more information. Do not breast-feed an infant while taking this medicine or for 1 year after the last dose. What side effects may I notice from receiving this medicine? Side effects that you should report to your doctor or health care professional as soon as possible:  allergic reactions like skin rash, itching or hives, swelling of the face, lips, or tongue  feeling faint or lightheaded, falls  pain, tingling, numbness, or weakness in the legs  signs and symptoms of infection like fever or chills; cough; flu-like symptoms; sore throat  vaginal bleeding Side effects that usually do not require medical attention (report to your doctor or health care professional if they continue or are bothersome):  aches, pains  constipation  diarrhea  headache  hot flashes  nausea, vomiting  pain at site where injected  stomach pain This list may not describe all possible side effects. Call your doctor for medical advice about side effects. You may report side effects to FDA at 1-800-FDA-1088. Where should I keep my medicine? This drug is given in a hospital or clinic and will   not be stored at home. NOTE: This sheet is a summary. It may not cover all possible information. If you have questions about this medicine, talk to your doctor, pharmacist, or health care provider.  2020 Elsevier/Gold Standard (2018-01-26 11:34:41)  

## 2019-05-08 NOTE — Assessment & Plan Note (Signed)
Metastatic breast cancer with bone metastases along with lung and liver metastases, progressed on Ibrance with anastrozole as well as Faslodex with Ibrance 06/29/18- 04/23/19.  Radiology review: 05/23/2018 CT CAP:New liver metastases 3.1 cm,increase in lung nodules, increase in sclerotic lesion of T11 and T9and S1 vertebral body. Liver biopsy 06/19/2018: Metastatic breast cancer ER 100%, PR 0%, Ki-67 50%, HER-2 negative BRCA1 mutation: Negative  PDL 1: 0%and foundation 1:PI 3 kinase mutation was identified ----------------------------------------------------------------------------------------------------------------------------------------------------------------------- Current treatment: Faslodex with Alpelisib started 04/23/2019 Patient's insurance rejected Xgeva.OnZometa every 3 months.   Palliative radiation: 01/25/2019-02/07/2019  PET CT scan 04/20/2019: Large hypermetabolic mass in the right hepatic lobe increase in size from 3 cm to 13.7 x 10.1 cm with SUV of 14.5, diffuse hypermetabolic bone metastases also increased, left adrenal gland metastatic lesion, increase in size of bilateral lung nodules  Alpelisib toxicities:  Return to clinic in 2 weeks for follow-up with labs

## 2019-05-08 NOTE — Progress Notes (Signed)
Pt receiving 1L IVF NS d/t recent near syncopal episode during Faslodex shot today and recent bouts of diarrhea as well as fatigue and dizziness/weakness.  Pt's granddaughter made aware by phone to pick up pt after infusion finished.  VSS.  Tolerated well.  Reports feeling better at end of infusion, able to drink some liquid.  Pt verbalized understanding of d/c instructions and to f/u as needed.  Escorted by NT to exit via [facility] with walker and belongings, family member on the way to take her home.

## 2019-05-09 ENCOUNTER — Telehealth: Payer: Self-pay | Admitting: Medical

## 2019-05-09 NOTE — Telephone Encounter (Signed)
No los per 7/7. °

## 2019-05-10 ENCOUNTER — Encounter: Payer: Medicare HMO | Admitting: Nutrition

## 2019-05-10 NOTE — Progress Notes (Signed)
[name] [name] was seen in the symptom management clinic today for IV fluids as she has been having diarrhea.  Sandi Mealy, MHS, PA-C Physician Assistant

## 2019-05-12 NOTE — Progress Notes (Signed)
Patient Care Team: [name], [name], MD as PCP - General (Internal Medicine) Curt Bears, MD (Hematology and Oncology) Paralee Cancel, MD (Orthopedic Surgery) Tyler Pita, MD (Radiation Oncology) Nicholas Lose, MD as Consulting Physician (Hematology and Oncology) Pleasant, Eppie Gibson, RN as [facility] [place] [facility]  DIAGNOSIS:    ICD-10-CM   1. Breast cancer metastasized to bone, right ([place])  C50.911    C79.51     SUMMARY OF ONCOLOGIC HISTORY: Oncology History  Breast cancer metastasized to bone, right ([place])  05/22/2001 Initial Diagnosis   Rt breast: stage IIb (T2, N1, M0) invasive right breast carcinoma with positive estrogen and progesterone receptor as well as HER-2/neu   06/12/2001 Surgery   right lumpectomy with right axillary lymph node dissection revealing 4/9 lymph nodes that were positive for malignancy.    08/12/2001 - 01/10/2002 Chemotherapy   Adj chemo CMF X 8    05/07/2002 - 12/14/2007 Anti-estrogen oral therapy   Adj Tamoxifen for 2 years and then switched to Letrozole (by Dr.Khan in Aug 2005), stopped Feb 2009   02/11/2003 - 04/05/2003 Radiation Therapy   Adj XRT by Tammi Klippel   03/18/2011 -  Anti-estrogen oral therapy   Anastrozole 1 mg daily   04/01/2011 Surgery   Left total hip replacement secondary to pathologic left femoral neck fracture   04/25/2017 Relapse/Recurrence   Anastrozole with Ibrance discontinued 05/30/2018 due to disease progression, Xgeva for bone metastases every 3 months   06/19/2018 Procedure   Liver biopsy: Metastatic breast cancer, ER 100%, PR 0%, Ki-67 50%, HER-2 negative   07/06/2018 Miscellaneous   Foundation 1 results: Microsatellite stable, tumor mutational burden 15 mutations per MB, ATRX, CCND1, CDK4 amplification, ERBB4 amplification, ESR1 amplification, FGFR 1, MDM2 amplification, MEM1, NST3 amplification, PIK3CA mutation, ZNF703 amplification; PDL 1: 0%   07/06/2018 -  Anti-estrogen oral therapy   Ibrance  with Faslodex   01/26/2019 - 02/07/2019 Radiation Therapy   Palliative radiation to the neck and subclavian area   05/01/2019 Treatment Plan Change   Alpelisib 254m daily     CHIEF COMPLIANT: Follow-up of metastatic breast cancer on Alpelisib  INTERVAL HISTORY: [name] HEHNis a 78y.o. with above-mentioned history of metastatic breast cancerwith metastases to the liver, lung, and boneswho started treatment with Alpelisib on 05/01/19. She presents to the clinic today for a toxicity check.  She reports no major problems from Alpelisib.  She has been experiencing diarrhea which has been bothering her and causing her rectal discomfort.  Denies any nausea or vomiting.  Her blood sugars apparently have been relatively stable.  She has recently moved her house and is still exhausted from that move.  REVIEW OF SYSTEMS:   Constitutional: Denies fevers, chills or abnormal weight loss Eyes: Denies blurriness of vision Ears, nose, mouth, throat, and face: Denies mucositis or sore throat Respiratory: Denies cough, dyspnea or wheezes Cardiovascular: Denies palpitation, chest discomfort Gastrointestinal: Denies nausea, heartburn or change in bowel habits Skin: Denies abnormal skin rashes Lymphatics: Denies new lymphadenopathy or easy bruising Neurological: Denies numbness, tingling or new weaknesses Behavioral/Psych: Mood is stable, no new changes  Extremities: Generalized weakness and using a wheelchair for ambulation. Breast: denies any pain or lumps or nodules in either breasts All other systems were reviewed with the patient and are negative.  I have reviewed the past medical history, past surgical history, social history and family history with the patient and they are unchanged from previous note.  ALLERGIES:  has No Known Allergies.  MEDICATIONS:  Current  Outpatient Medications  Medication Sig Dispense Refill  . acetaminophen (TYLENOL) 500 MG tablet Take 500 mg by mouth daily as  needed (arm pain).    Marland Kitchen alpelisib (PIQRAY 200MG DAILY DOSE) 200 MG Therapy Pack Take one 264m tablet with food at the same time daily. Swallow whole, do not crush, chew, or split. 30 each 3  . ALPRAZolam (XANAX) 0.5 MG tablet Take 1 tablet (0.5 mg total) by mouth 2 (two) times daily as needed for anxiety or sleep. 60 tablet 3  . amLODipine (NORVASC) 10 MG tablet Take 1 tablet (10 mg total) by mouth daily. 90 tablet 3  . apixaban (ELIQUIS) 5 MG TABS tablet Take 1 tablet (5 mg total) by mouth 2 (two) times daily. Take 10 mg bid for 7 days orally and then decrease to 5 mg bid orally thereafter. 60 tablet 5  . Cholecalciferol (VITAMIN D3) 1000 UNITS CAPS Take 1 capsule by mouth daily.     .Marland Kitchendexamethasone (DECADRON) 1 MG tablet Take 1 tablet (1 mg total) by mouth daily. 30 tablet 3  . gabapentin (NEURONTIN) 100 MG capsule Take 1 capsule (100 mg total) by mouth at bedtime. 30 capsule 5  . hydrochlorothiazide (HYDRODIURIL) 25 MG tablet Take 25 mg by mouth daily.  3  . losartan (COZAAR) 100 MG tablet Take 100 mg by mouth daily.  3  . meclizine (ANTIVERT) 12.5 MG tablet Take 1 tablet (12.5 mg total) by mouth 2 (two) times daily as needed for dizziness. 10 tablet 0  . nystatin (MYCOSTATIN/NYSTOP) powder Apply 1 Dose topically daily.  3  . pioglitazone (ACTOS) 15 MG tablet Take 1 tablet (15 mg total) by mouth daily. 30 tablet 3  . Zoledronic Acid (ZOMETA) 4 MG/100ML IVPB Inject 4 mg into the vein every 3 (three) months.     No current facility-administered medications for this visit.     PHYSICAL EXAMINATION: ECOG PERFORMANCE STATUS: 1 - Symptomatic but completely ambulatory  Vitals:   05/14/19 1106  BP: 99/66  Pulse: 80  Resp: 17  Temp: 98.5 F (36.9 C)  SpO2: 100%   Filed Weights   05/14/19 1106  Weight: 184 lb 14.4 oz (83.9 kg)    GENERAL: alert, no distress and comfortable SKIN: skin color, texture, turgor are normal, no rashes or significant lesions EYES: normal, Conjunctiva are pink  and non-injected, sclera clear OROPHARYNX: no exudate, no erythema and lips, buccal mucosa, and tongue normal  NECK: supple, thyroid normal size, non-tender, without nodularity LYMPH: no palpable lymphadenopathy in the cervical, axillary or inguinal LUNGS: clear to auscultation and percussion with normal breathing effort HEART: regular rate & rhythm and no murmurs and no lower extremity edema ABDOMEN: abdomen soft, non-tender and normal bowel sounds MUSCULOSKELETAL: no cyanosis of digits and no clubbing  NEURO: alert & oriented x 3 with fluent speech, no focal motor/sensory deficits EXTREMITIES: No lower extremity edema  LABORATORY DATA:  I have reviewed the data as listed CMP Latest Ref Rng & Units 05/08/2019 04/24/2019 03/13/2019  Glucose 70 - 99 mg/dL 169(H) 108(H) 103(H)  BUN 8 - 23 mg/dL 21 19 24(H)  Creatinine 0.44 - 1.00 mg/dL 2.72(H) 2.05(H) 1.96(H)  Sodium 135 - 145 mmol/L 137 140 137  Potassium 3.5 - 5.1 mmol/L 4.0 4.0 3.6  Chloride 98 - 111 mmol/L 102 109 104  CO2 22 - 32 mmol/L 21(L) 21(L) 22  Calcium 8.9 - 10.3 mg/dL 9.2 9.1 9.6  Total Protein 6.5 - 8.1 g/dL 9.0(H) 7.9 8.1  Total Bilirubin 0.3 -  1.2 mg/dL 0.4 0.4 0.4  Alkaline Phos 38 - 126 U/L 87 71 79  AST 15 - 41 U/L 21 20 13(L)  ALT 0 - 44 U/L 6 8 8     Lab Results  Component Value Date   WBC 6.4 05/14/2019   HGB 9.9 (L) 05/14/2019   HCT 30.3 (L) 05/14/2019   MCV 108.6 (H) 05/14/2019   PLT 281 05/14/2019   NEUTROABS 4.5 05/14/2019    ASSESSMENT & PLAN:  Breast cancer metastasized to bone, right ([facility]) Metastatic breast cancer with bone metastases along with lung and liver metastases, progressed on Ibrance with anastrozole as well as Faslodex with Ibrance 06/29/18- 04/23/19.  Radiology review: 05/23/2018 CT CAP:New liver metastases 3.1 cm,increase in lung nodules, increase in sclerotic lesion of T11 and T9and S1 vertebral body. Liver biopsy 06/19/2018: Metastatic breast cancer ER 100%, PR 0%, Ki-67 50%, HER-2  negative BRCA1 mutation: Negative  PDL 1: 0%and foundation 1:PI 3 kinase mutation was identified ----------------------------------------------------------------------------------------------------------------------------------------------------------------------- Current treatment: Faslodex with Alpelisib started 04/23/2019 Patient's insurance rejected Xgeva.OnZometa every 3 months.   Palliative radiation: 01/25/2019-02/07/2019  PET CT scan 04/20/2019: Large hypermetabolic mass in the right hepatic lobe increase in size from 3 cm to 13.7 x 10.1 cm with SUV of 14.5, diffuse hypermetabolic bone metastases also increased, left adrenal gland metastatic lesion, increase in size of bilateral lung nodules  Alpelisib toxicities: 1.  Diarrhea causing rectal discomfort: Much improved 2.  Fatigue  Otherwise patient is tolerating the treatment fairly well.  We are awaiting her blood sugars and lipid levels from today. Return to clinic in 4 weeks for follow-up with labs and Faslodex injection.  No orders of the defined types were placed in this encounter.  The patient has a good understanding of the overall plan. she agrees with it. she will call with any problems that may develop before the next visit here.  Nicholas Lose, MD 05/14/2019  Julious Oka Dorshimer am acting as scribe for Dr. Nicholas Lose.  I have reviewed the above documentation for accuracy and completeness, and I agree with the above.

## 2019-05-14 ENCOUNTER — Inpatient Hospital Stay: Payer: Medicare HMO | Admitting: Nutrition

## 2019-05-14 ENCOUNTER — Inpatient Hospital Stay: Payer: Medicare HMO

## 2019-05-14 ENCOUNTER — Other Ambulatory Visit: Payer: Self-pay

## 2019-05-14 ENCOUNTER — Inpatient Hospital Stay (HOSPITAL_BASED_OUTPATIENT_CLINIC_OR_DEPARTMENT_OTHER): Payer: Medicare HMO | Admitting: Hematology and Oncology

## 2019-05-14 DIAGNOSIS — C787 Secondary malignant neoplasm of liver and intrahepatic bile duct: Secondary | ICD-10-CM

## 2019-05-14 DIAGNOSIS — C50911 Malignant neoplasm of unspecified site of right female breast: Secondary | ICD-10-CM

## 2019-05-14 DIAGNOSIS — C7800 Secondary malignant neoplasm of unspecified lung: Secondary | ICD-10-CM

## 2019-05-14 DIAGNOSIS — C7951 Secondary malignant neoplasm of bone: Secondary | ICD-10-CM | POA: Diagnosis not present

## 2019-05-14 DIAGNOSIS — Z79811 Long term (current) use of aromatase inhibitors: Secondary | ICD-10-CM

## 2019-05-14 DIAGNOSIS — Z170 Estrogen receptor positive status [ER+]: Secondary | ICD-10-CM

## 2019-05-14 DIAGNOSIS — R634 Abnormal weight loss: Secondary | ICD-10-CM

## 2019-05-14 DIAGNOSIS — Z923 Personal history of irradiation: Secondary | ICD-10-CM

## 2019-05-14 DIAGNOSIS — Z79818 Long term (current) use of other agents affecting estrogen receptors and estrogen levels: Secondary | ICD-10-CM | POA: Diagnosis not present

## 2019-05-14 DIAGNOSIS — Z7901 Long term (current) use of anticoagulants: Secondary | ICD-10-CM

## 2019-05-14 DIAGNOSIS — R5383 Other fatigue: Secondary | ICD-10-CM | POA: Diagnosis not present

## 2019-05-14 DIAGNOSIS — R197 Diarrhea, unspecified: Secondary | ICD-10-CM

## 2019-05-14 DIAGNOSIS — Z79899 Other long term (current) drug therapy: Secondary | ICD-10-CM

## 2019-05-14 LAB — CBC WITH DIFFERENTIAL (CANCER CENTER ONLY)
Abs Immature Granulocytes: 0.07 10*3/uL (ref 0.00–0.07)
Basophils Absolute: 0.1 10*3/uL (ref 0.0–0.1)
Basophils Relative: 1 %
Eosinophils Absolute: 0 10*3/uL (ref 0.0–0.5)
Eosinophils Relative: 0 %
HCT: 30.3 % — ABNORMAL LOW (ref 36.0–46.0)
Hemoglobin: 9.9 g/dL — ABNORMAL LOW (ref 12.0–15.0)
Immature Granulocytes: 1 %
Lymphocytes Relative: 14 %
Lymphs Abs: 0.9 10*3/uL (ref 0.7–4.0)
MCH: 35.5 pg — ABNORMAL HIGH (ref 26.0–34.0)
MCHC: 32.7 g/dL (ref 30.0–36.0)
MCV: 108.6 fL — ABNORMAL HIGH (ref 80.0–100.0)
Monocytes Absolute: 0.9 10*3/uL (ref 0.1–1.0)
Monocytes Relative: 14 %
Neutro Abs: 4.5 10*3/uL (ref 1.7–7.7)
Neutrophils Relative %: 70 %
Platelet Count: 281 10*3/uL (ref 150–400)
RBC: 2.79 MIL/uL — ABNORMAL LOW (ref 3.87–5.11)
RDW: 15.9 % — ABNORMAL HIGH (ref 11.5–15.5)
WBC Count: 6.4 10*3/uL (ref 4.0–10.5)
nRBC: 0 % (ref 0.0–0.2)

## 2019-05-14 LAB — CMP (CANCER CENTER ONLY)
ALT: 9 U/L (ref 0–44)
AST: 21 U/L (ref 15–41)
Albumin: 3.5 g/dL (ref 3.5–5.0)
Alkaline Phosphatase: 83 U/L (ref 38–126)
Anion gap: 10 (ref 5–15)
BUN: 17 mg/dL (ref 8–23)
CO2: 23 mmol/L (ref 22–32)
Calcium: 9.4 mg/dL (ref 8.9–10.3)
Chloride: 106 mmol/L (ref 98–111)
Creatinine: 1.98 mg/dL — ABNORMAL HIGH (ref 0.44–1.00)
GFR, Est AFR Am: 28 mL/min — ABNORMAL LOW (ref >60)
GFR, Estimated: 24 mL/min — ABNORMAL LOW (ref >60)
Glucose, Bld: 128 mg/dL — ABNORMAL HIGH (ref 70–99)
Potassium: 4.2 mmol/L (ref 3.5–5.1)
Sodium: 139 mmol/L (ref 135–145)
Total Bilirubin: 0.3 mg/dL (ref 0.3–1.2)
Total Protein: 8.7 g/dL — ABNORMAL HIGH (ref 6.5–8.1)

## 2019-05-14 LAB — LIPID PANEL
Cholesterol: 172 mg/dL (ref 0–200)
HDL: 62 mg/dL (ref >40)
LDL Cholesterol: 95 mg/dL (ref 0–99)
Total CHOL/HDL Ratio: 2.8 RATIO
Triglycerides: 75 mg/dL (ref <150)
VLDL: 15 mg/dL (ref 0–40)

## 2019-05-14 NOTE — Progress Notes (Signed)
Brief nutrition follow up completed. Patient anxious to leave. Report she is eating well. Her wt is stable at 184 pounds. She has not started Ensure because she was not sure when she should. Denies loose stools or constipation. Provided samples and coupons. Inadequate oral intake stable. No follow up scheduled but patient has RD contact information.

## 2019-05-15 ENCOUNTER — Telehealth: Payer: Self-pay | Admitting: Hematology and Oncology

## 2019-05-15 NOTE — Telephone Encounter (Signed)
I talk with patient told her that I would mail schedule

## 2019-05-22 ENCOUNTER — Other Ambulatory Visit: Payer: Medicare HMO

## 2019-05-22 ENCOUNTER — Ambulatory Visit: Payer: Medicare HMO | Admitting: Hematology and Oncology

## 2019-05-24 ENCOUNTER — Other Ambulatory Visit: Payer: Self-pay | Admitting: *Deleted

## 2019-05-24 MED FILL — PIOGLITAZONE HCL 15 MG TAB: 15 | 30 days supply | Qty: 30 | Fill #1

## 2019-05-24 MED FILL — ELIQUIS 5 MG TABLET: 5 | 30 days supply | Qty: 60 | Fill #2

## 2019-05-24 MED FILL — GABAPENTIN 100 MG CAPSULE: 100 | 30 days supply | Qty: 30 | Fill #2

## 2019-05-24 MED FILL — PIQRAY (200 MG DAILY DOSE): 200 | 28 days supply | Qty: 28 | Fill #1

## 2019-05-25 NOTE — Patient Outreach (Signed)
[facility]) Care Management  05/25/2019   [name] [name] [birth year] [phone]  RN Health Coach telephone call to patient.  [employer] compliance verified. Patient blood pressure is better controlled. She is now unable to check blood pressure at home due to the lack of movement and swelling and pain in the right arm and pain in back.  Patient states she is taking medication as prescribed. Per patient she has moved into her new apartment and has more room. Per patient her son came and stayed for a week to assist with the moving. Patient stated she is unable to get things all set up because she cannot move her right arm. Patient also stated that she is having a problem with constipation. Patient understands that she has a very poor appetite and this is contributing. Patient will talk with PCP regarding some physical therapy and a care giver. Patient will also talk with PCP about palliative care since mentally not ready for Hospice.. Patient has agreed to follow up outreach calls  Current Medications:  Current Outpatient Medications  Medication Sig Dispense Refill  . acetaminophen (TYLENOL) 500 MG tablet Take 500 mg by mouth daily as needed (arm pain).    Marland Kitchen alpelisib (PIQRAY 200MG  DAILY DOSE) 200 MG Therapy Pack Take one 200mg  tablet with food at the same time daily. Swallow whole, do not crush, chew, or split. 30 each 3  . ALPRAZolam (XANAX) 0.5 MG tablet Take 1 tablet (0.5 mg total) by mouth 2 (two) times daily as needed for anxiety or sleep. 60 tablet 3  . amLODipine (NORVASC) 10 MG tablet Take 1 tablet (10 mg total) by mouth daily. 90 tablet 3  . apixaban (ELIQUIS) 5 MG TABS tablet Take 1 tablet (5 mg total) by mouth 2 (two) times daily. Take 10 mg bid for 7 days orally and then decrease to 5 mg bid orally thereafter. 60 tablet 5  . Cholecalciferol (VITAMIN D3) 1000 UNITS CAPS Take 1 capsule by mouth daily.     Marland Kitchen dexamethasone (DECADRON) 1 MG tablet Take 1 tablet (1 mg total) by  mouth daily. 30 tablet 3  . gabapentin (NEURONTIN) 100 MG capsule Take 1 capsule (100 mg total) by mouth at bedtime. 30 capsule 5  . hydrochlorothiazide (HYDRODIURIL) 25 MG tablet Take 25 mg by mouth daily.  3  . losartan (COZAAR) 100 MG tablet Take 100 mg by mouth daily.  3  . meclizine (ANTIVERT) 12.5 MG tablet Take 1 tablet (12.5 mg total) by mouth 2 (two) times daily as needed for dizziness. 10 tablet 0  . nystatin (MYCOSTATIN/NYSTOP) powder Apply 1 Dose topically daily.  3  . pioglitazone (ACTOS) 15 MG tablet Take 1 tablet (15 mg total) by mouth daily. 30 tablet 3  . Zoledronic Acid (ZOMETA) 4 MG/100ML IVPB Inject 4 mg into the vein every 3 (three) months.     No current facility-administered medications for this visit.     Functional Status:  In your present state of health, do you have any difficulty performing the following activities: 05/25/2019 04/25/2019  Hearing? N N  Vision? N N  Difficulty concentrating or making decisions? Y Y  Comment patient is having difficulty writing things down so hard to remember -  Walking or climbing stairs? Tempie Donning  Comment she uses a walker. She has moved to a new apartment but hard to get things unpacked uses a walker  Dressing or bathing? N Y  Comment unable to use rt arm rt arm is limp  Doing errands, shopping? Tempie Donning  Preparing Food and eating ? Tempie Donning  Comment difficulty opening jar lids and cans -  Using the Toilet? N Y  In the past six months, have you accidently leaked urine? Y N  Do you have problems with loss of bowel control? N N  Managing your Medications? N N  Managing your Finances? N N  Housekeeping or managing your Housekeeping? Y Y  Some recent data might be hidden    Fall/Depression Screening: Fall Risk  05/25/2019 04/25/2019 01/25/2019  Falls in the past year? 1 1 1   Comment - - -  Number falls in past yr: 1 1 1   Injury with Fall? 0 0 0  Risk for fall due to : History of fall(s);Impaired balance/gait;Impaired mobility History of  fall(s);Impaired balance/gait;Impaired mobility History of fall(s);Impaired balance/gait;Impaired mobility  Risk for fall due to: Comment - - -  Follow up Falls evaluation completed;Education provided Falls evaluation completed;Education provided;Falls prevention discussed Falls evaluation completed;Falls prevention discussed   PHQ 2/9 Scores 05/25/2019 04/25/2019 01/25/2019 01/02/2019 12/28/2018 11/30/2018 07/21/2018  PHQ - 2 Score 1 1 1 1 2 2 2   PHQ- 9 Score - - 7 - 9 9 -   THN CM Care Plan Problem One     Most Recent Value  Care Plan Problem One  Knowledge Deficit in Self Management of Hypertension  Role Documenting the Problem One  [place] for Problem One  Active  THN Long Term Goal   Patient will look at health maintenace within the next 90 days  Interventions for Problem One Long Term Goal  RN reiterates the need to reschedule her cancelled appointments. RN will follow up for compliance  THN CM Short Term Goal #1   Patient will discuss with physician about palliatuive care and hospice within the next 30 days  THN CM Short Term Goal #1 Start Date  05/25/19  Interventions for Short Term Goal #1  Patient has not spoken with Dr about palliative care or hospice. RN discusse with patient the difference and she could possibly get some assistance with care. RN will follow up with further discussion      Assessment:  Blood pressure is more stable Appetite poor Problems with constipation Patient needs assistance with home care  Plan:  RN discussed health maintenance RN sent educational material on constipation in adults RN sent educational material on Home treatments for constipation when you have cancer RN sent educational material oh High Fiber Diet Patient will discuss with oncologist regarding Palliative care versus Hospice consult Patient will talk with Patient regarding physical therapy and possible caregiver assistance RN will follow up within the month of  September  Quintus Premo [place] [facility] [phone]

## 2019-06-04 ENCOUNTER — Telehealth: Payer: Self-pay

## 2019-06-04 NOTE — Telephone Encounter (Signed)
Oral Oncology Patient Advocate Encounter  [place] notified me that the Joylene Draft was returned to stock due to the patient not picking her medicine up.  I spoke to the patient and she has not had anyone that could get her medicine. They should be able to come this Wed. or Thurs.  Arranged with the pharmacy for [facility] and Eliquis to be filled for pick up on 8/5.  The patient has 2 pills left. I did offer the patient shipping but this is not a good option for her, she stated that she does not go check it often.  I explained that the pharmacy will have this ready for her on 8/5, she verbalized understanding and great appreciation.  During our conversation she said that she has not felt like doing anything at all in the last week, she does not eat much and does not feel like taking her medicine. I offered to transfer her to the nurse and she declined stating that she would just talk to the doctor at her next appointment on 8/10.   [place] [facility] [place] Phone [phone] Fax [phone] 06/04/2019   10:21 AM

## 2019-06-04 NOTE — Assessment & Plan Note (Signed)
Metastatic breast cancer with bone metastases along with lung and liver metastases, progressed on Ibrance with anastrozole as well as Faslodex with Ibrance 06/29/18- 04/23/19.  Radiology review: 05/23/2018 CT CAP:New liver metastases 3.1 cm,increase in lung nodules, increase in sclerotic lesion of T11 and T9and S1 vertebral body. Liver biopsy 06/19/2018: Metastatic breast cancer ER 100%, PR 0%, Ki-67 50%, HER-2 negative BRCA1 mutation: Negative  PDL 1: 0%and foundation 1:PI 3 kinase mutation was identified ----------------------------------------------------------------------------------------------------------------------------------------------------------------------- Current treatment: Faslodex with Alpelisib started 04/23/2019 Patient's insurance rejected Xgeva.OnZometa every 3 months.   Palliative radiation: 01/25/2019-02/07/2019  PET CT scan 04/20/2019: Large hypermetabolic mass in the right hepatic lobe increase in size from 3 cm to 13.7 x 10.1 cm with SUV of 14.5, diffuse hypermetabolic bone metastases also increased, left adrenal gland metastatic lesion, increase in size of bilateral lung nodules  Alpelisib toxicities: 1.  Diarrhea causing rectal discomfort: Much improved 2.  Fatigue  Return to clinic in 4 weeks for follow-up with labs and Faslodex injection.

## 2019-06-06 MED FILL — ELIQUIS 5 MG TABLET: 5 | 30 days supply | Qty: 60 | Fill #2

## 2019-06-06 MED FILL — PIQRAY (200 MG DAILY DOSE): 200 | 28 days supply | Qty: 28 | Fill #1

## 2019-06-07 ENCOUNTER — Other Ambulatory Visit: Payer: Self-pay

## 2019-06-07 ENCOUNTER — Ambulatory Visit (INDEPENDENT_AMBULATORY_CARE_PROVIDER_SITE_OTHER): Payer: Medicare HMO | Admitting: Internal Medicine

## 2019-06-07 ENCOUNTER — Encounter: Payer: Self-pay | Admitting: Internal Medicine

## 2019-06-07 VITALS — BP 150/60 | HR 106 | Temp 97.6°F | Ht 63.0 in | Wt 178.0 lb

## 2019-06-07 DIAGNOSIS — C7951 Secondary malignant neoplasm of bone: Secondary | ICD-10-CM

## 2019-06-07 DIAGNOSIS — R634 Abnormal weight loss: Secondary | ICD-10-CM

## 2019-06-07 DIAGNOSIS — R627 Adult failure to thrive: Secondary | ICD-10-CM | POA: Diagnosis not present

## 2019-06-07 DIAGNOSIS — C50911 Malignant neoplasm of unspecified site of right female breast: Secondary | ICD-10-CM

## 2019-06-07 DIAGNOSIS — R29898 Other symptoms and signs involving the musculoskeletal system: Secondary | ICD-10-CM

## 2019-06-07 DIAGNOSIS — E559 Vitamin D deficiency, unspecified: Secondary | ICD-10-CM | POA: Diagnosis not present

## 2019-06-07 MED ORDER — CLOTRIMAZOLE-BETAMETHASONE 1-0.05 % EX CREA: 1.0000 "application " | TOPICAL_CREAM | Freq: Two times a day (BID) | CUTANEOUS | 1 refills | Status: AC

## 2019-06-07 MED FILL — CLOTRIMAZOLE-BETAMETHASONE: 1-0.05 | 15 days supply | Qty: 30 | Fill #0

## 2019-06-07 NOTE — Progress Notes (Signed)
Subjective:  Patient ID: [name] [name], female    DOB: December 28, 1940  Age: 78 y.o. MRN: [id number]  CC: No chief complaint on file.   HPI YARITHZA MINK presents for R hand weakness, DOE, cancer Unable to take care of herself. Pt moved to [facility] She is on [place] and Zometa C/o skin irritation in the abdominal skin folds  Outpatient Medications Prior to Visit  Medication Sig Dispense Refill  . acetaminophen (TYLENOL) 500 MG tablet Take 500 mg by mouth daily as needed (arm pain).    Marland Kitchen alpelisib (PIQRAY 200MG  DAILY DOSE) 200 MG Therapy Pack Take one 200mg  tablet with food at the same time daily. Swallow whole, do not crush, chew, or split. 30 each 3  . ALPRAZolam (XANAX) 0.5 MG tablet Take 1 tablet (0.5 mg total) by mouth 2 (two) times daily as needed for anxiety or sleep. 60 tablet 3  . amLODipine (NORVASC) 10 MG tablet Take 1 tablet (10 mg total) by mouth daily. 90 tablet 3  . apixaban (ELIQUIS) 5 MG TABS tablet Take 1 tablet (5 mg total) by mouth 2 (two) times daily. Take 10 mg bid for 7 days orally and then decrease to 5 mg bid orally thereafter. 60 tablet 5  . Cholecalciferol (VITAMIN D3) 1000 UNITS CAPS Take 1 capsule by mouth daily.     Marland Kitchen dexamethasone (DECADRON) 1 MG tablet Take 1 tablet (1 mg total) by mouth daily. 30 tablet 3  . gabapentin (NEURONTIN) 100 MG capsule Take 1 capsule (100 mg total) by mouth at bedtime. 30 capsule 5  . hydrochlorothiazide (HYDRODIURIL) 25 MG tablet Take 25 mg by mouth daily.  3  . losartan (COZAAR) 100 MG tablet Take 100 mg by mouth daily.  3  . meclizine (ANTIVERT) 12.5 MG tablet Take 1 tablet (12.5 mg total) by mouth 2 (two) times daily as needed for dizziness. 10 tablet 0  . nystatin (MYCOSTATIN/NYSTOP) powder Apply 1 Dose topically daily.  3  . pioglitazone (ACTOS) 15 MG tablet Take 1 tablet (15 mg total) by mouth daily. 30 tablet 3  . Zoledronic Acid (ZOMETA) 4 MG/100ML IVPB Inject 4 mg into the vein every 3 (three) months.      No facility-administered medications prior to visit.     ROS: Review of Systems  Constitutional: Positive for fatigue and unexpected weight change. Negative for activity change, appetite change and chills.  HENT: Negative for congestion, mouth sores and sinus pressure.   Eyes: Negative for visual disturbance.  Respiratory: Positive for shortness of breath. Negative for cough and chest tightness.   Gastrointestinal: Negative for abdominal pain and nausea.  Genitourinary: Negative for difficulty urinating, frequency and vaginal pain.  Musculoskeletal: Positive for arthralgias, back pain and gait problem.  Skin: Negative for pallor and rash.  Neurological: Positive for dizziness, weakness and light-headedness. Negative for tremors, numbness and headaches.  Psychiatric/Behavioral: Positive for dysphoric mood. Negative for confusion, sleep disturbance and suicidal ideas. The patient is nervous/anxious.     Objective:  BP (!) 150/60 (BP Location: Left Arm, Patient Position: Sitting, Cuff Size: Normal)   Pulse (!) 106   Temp 97.6 F (36.4 C) (Oral)   Ht 5\' 3"  (1.6 m)   Wt 178 lb (80.7 kg)   SpO2 98%   BMI 31.53 kg/m   BP Readings from Last 3 Encounters:  06/07/19 (!) 150/60  05/14/19 99/66  05/08/19 (!) 143/53    Wt Readings from Last 3 Encounters:  06/07/19 178 lb (80.7  kg)  05/14/19 184 lb 14.4 oz (83.9 kg)  05/08/19 184 lb 1.6 oz (83.5 kg)    Physical Exam Constitutional:      General: She is not in acute distress.    Appearance: She is well-developed. She is obese. She is ill-appearing.  HENT:     Head: Normocephalic.     Right Ear: External ear normal.     Left Ear: External ear normal.     Nose: Nose normal.  Eyes:     General:        Right eye: No discharge.        Left eye: No discharge.     Conjunctiva/sclera: Conjunctivae normal.     Pupils: Pupils are equal, round, and reactive to light.  Neck:     Musculoskeletal: Normal range of motion and neck supple.      Thyroid: No thyromegaly.     Vascular: No JVD.     Trachea: No tracheal deviation.  Cardiovascular:     Rate and Rhythm: Normal rate and regular rhythm.     Heart sounds: Normal heart sounds.  Pulmonary:     Effort: Respiratory distress present.     Breath sounds: No stridor. No wheezing.  Abdominal:     General: Bowel sounds are normal. There is no distension.     Palpations: Abdomen is soft. There is no mass.     Tenderness: There is no abdominal tenderness. There is no guarding or rebound.  Musculoskeletal:        General: No tenderness.  Lymphadenopathy:     Cervical: No cervical adenopathy.  Skin:    Findings: No erythema or rash.  Neurological:     Cranial Nerves: No cranial nerve deficit.     Motor: Weakness present. No abnormal muscle tone.     Coordination: Coordination abnormal.     Gait: Gait abnormal.     Deep Tendon Reflexes: Reflexes normal.  Psychiatric:        Behavior: Behavior normal.        Thought Content: Thought content normal.        Judgment: Judgment normal.   intertrigo Cane Chronically ill No LE edema, no JVD Stooped posture     Ue Venous Duplex  Result Date: 04/24/2019 UPPER VENOUS STUDY  Indications: Swelling Limitations: Poor ultrasound/tissue interface. Comparison Study: previous study done on 12/13/18 Performing Technologist: Abram Sander RVS  Examination Guidelines: A complete evaluation includes B-mode imaging, spectral Doppler, color Doppler, and power Doppler as needed of all accessible portions of each vessel. Bilateral testing is considered an integral part of a complete examination. Limited examinations for reoccurring indications may be performed as noted.  Right Findings: +----------+------------+---------+-----------+----------+-------+ RIGHT     CompressiblePhasicitySpontaneousPropertiesSummary +----------+------------+---------+-----------+----------+-------+ IJV           Full       Yes       Yes                       +----------+------------+---------+-----------+----------+-------+ Subclavian    Full       Yes       Yes                      +----------+------------+---------+-----------+----------+-------+ Axillary      None                                  Chronic +----------+------------+---------+-----------+----------+-------+  Brachial      Full       Yes       Yes                      +----------+------------+---------+-----------+----------+-------+ Radial        Full                                          +----------+------------+---------+-----------+----------+-------+ Ulnar         Full                                          +----------+------------+---------+-----------+----------+-------+ Cephalic      Full                                          +----------+------------+---------+-----------+----------+-------+ Basilic       Full                                          +----------+------------+---------+-----------+----------+-------+  Left Findings: +----------+------------+---------+-----------+----------+-------+ LEFT      CompressiblePhasicitySpontaneousPropertiesSummary +----------+------------+---------+-----------+----------+-------+ Subclavian    Full       Yes       Yes                      +----------+------------+---------+-----------+----------+-------+  Summary:  Right: No evidence of superficial vein thrombosis in the upper extremity. Findings consistent with chronic deep vein thrombosis involving the right axillary vein.  Left: No evidence of thrombosis in the subclavian.  *See table(s) above for measurements and observations.  Diagnosing physician: Deitra Mayo MD Electronically signed by Deitra Mayo MD on 04/24/2019 at 11:39:18 AM.    Final     Assessment & Plan:   There are no diagnoses linked to this encounter.   No orders of the defined types were placed in this encounter.    Follow-up: No follow-ups  on file.  Walker Kehr, MD

## 2019-06-07 NOTE — Assessment & Plan Note (Signed)
Vit D 

## 2019-06-07 NOTE — Assessment & Plan Note (Signed)
She is on Piqray and Zometa Palliative care ref due FTT

## 2019-06-07 NOTE — Assessment & Plan Note (Signed)
Palliative Care ref PT/OT/RN [facility]

## 2019-06-07 NOTE — Assessment & Plan Note (Signed)
Palliative care ref [facility] ref due to FTT

## 2019-06-07 NOTE — Assessment & Plan Note (Signed)
FTT - pt needs help

## 2019-06-10 NOTE — Progress Notes (Signed)
Patient Care Team: [name], [name], MD as PCP - General (Internal Medicine) Curt Bears, MD (Hematology and Oncology) Paralee Cancel, MD (Orthopedic Surgery) Tyler Pita, MD (Radiation Oncology) Nicholas Lose, MD as Consulting Physician (Hematology and Oncology) Pleasant, Eppie Gibson, RN as [facility]  DIAGNOSIS:    ICD-10-CM   1. Breast cancer metastasized to bone, right ([place])  C50.911    C79.51     SUMMARY OF ONCOLOGIC HISTORY: Oncology History  Breast cancer metastasized to bone, right ([place])  05/22/2001 Initial Diagnosis   Rt breast: stage IIb (T2, N1, M0) invasive right breast carcinoma with positive estrogen and progesterone receptor as well as HER-2/neu   06/12/2001 Surgery   right lumpectomy with right axillary lymph node dissection revealing 4/9 lymph nodes that were positive for malignancy.    08/12/2001 - 01/10/2002 Chemotherapy   Adj chemo CMF X 8    05/07/2002 - 12/14/2007 Anti-estrogen oral therapy   Adj Tamoxifen for 2 years and then switched to Letrozole (by Dr.Khan in Aug 2005), stopped Feb 2009   02/11/2003 - 04/05/2003 Radiation Therapy   Adj XRT by Tammi Klippel   03/18/2011 -  Anti-estrogen oral therapy   Anastrozole 1 mg daily   04/01/2011 Surgery   Left total hip replacement secondary to pathologic left femoral neck fracture   04/25/2017 Relapse/Recurrence   Anastrozole with Ibrance discontinued 05/30/2018 due to disease progression, Xgeva for bone metastases every 3 months   06/19/2018 Procedure   Liver biopsy: Metastatic breast cancer, ER 100%, PR 0%, Ki-67 50%, HER-2 negative   07/06/2018 Miscellaneous   Foundation 1 results: Microsatellite stable, tumor mutational burden 15 mutations per MB, ATRX, CCND1, CDK4 amplification, ERBB4 amplification, ESR1 amplification, FGFR 1, MDM2 amplification, MEM1, NST3 amplification, PIK3CA mutation, ZNF703 amplification; PDL 1: 0%   07/06/2018 -  Anti-estrogen oral therapy   Ibrance  with Faslodex   01/26/2019 - 02/07/2019 Radiation Therapy   Palliative radiation to the neck and subclavian area   05/01/2019 Treatment Plan Change   Alpelisib 265m daily     CHIEF COMPLIANT: Follow-up of metastatic breast canceron Alpelisib  INTERVAL HISTORY: [name] [name] a 78y.o. with above-mentioned history of metastatic breast cancerwith metastases to the liver, lung, and boneswho is currently on treatment with Alpelisib.She presents to the clinic todayfor a toxicity check. Her major complaint today is related to the area underneath the belly where she has a raw discomfort and Dr.Paltnikof had sent a prescription ointment and she is waiting for the ointment to come to her house.  Because [place] on the scat bus her ability to pick up prescriptions is very limited. She was also experiencing constipation for which she took MiraLAX which led to diarrhea.  She is finally been able to regulate her bowels.  She also threw up this morning.  REVIEW OF SYSTEMS:   Constitutional: Denies fevers, chills or abnormal weight loss Eyes: Denies blurriness of vision Ears, nose, mouth, throat, and face: Denies mucositis or sore throat Respiratory: Denies cough, dyspnea or wheezes Cardiovascular: Denies palpitation, chest discomfort Gastrointestinal: Nausea is present, constipation alternating with diarrhea Skin: Denies abnormal skin rashes Lymphatics: Denies new lymphadenopathy or easy bruising Neurological: Denies numbness, tingling or new weaknesses Behavioral/Psych: Mood is stable, no new changes  Extremities: No lower extremity edema Breast: denies any pain or lumps or nodules in either breasts All other systems were reviewed with the patient and are negative.  I have reviewed the past medical history, past surgical history, social history  and family history with the patient and they are unchanged from previous note.  ALLERGIES:  has No Known Allergies.  MEDICATIONS:  Current  Outpatient Medications  Medication Sig Dispense Refill   acetaminophen (TYLENOL) 500 MG tablet Take 500 mg by mouth daily as needed (arm pain).     alpelisib (PIQRAY 200MG DAILY DOSE) 200 MG Therapy Pack Take one 278m tablet with food at the same time daily. Swallow whole, do not crush, chew, or split. 30 each 3   ALPRAZolam (XANAX) 0.5 MG tablet Take 1 tablet (0.5 mg total) by mouth 2 (two) times daily as needed for anxiety or sleep. 60 tablet 3   amLODipine (NORVASC) 10 MG tablet Take 1 tablet (10 mg total) by mouth daily. 90 tablet 3   apixaban (ELIQUIS) 5 MG TABS tablet Take 1 tablet (5 mg total) by mouth 2 (two) times daily. Take 10 mg bid for 7 days orally and then decrease to 5 mg bid orally thereafter. 60 tablet 5   Cholecalciferol (VITAMIN D3) 1000 UNITS CAPS Take 1 capsule by mouth daily.      clotrimazole-betamethasone (LOTRISONE) cream Apply 1 application topically 2 (two) times daily. 90 g 1   gabapentin (NEURONTIN) 100 MG capsule Take 1 capsule (100 mg total) by mouth at bedtime. 30 capsule 5   hydrochlorothiazide (HYDRODIURIL) 25 MG tablet Take 25 mg by mouth daily.  3   losartan (COZAAR) 100 MG tablet Take 100 mg by mouth daily.  3   meclizine (ANTIVERT) 12.5 MG tablet Take 1 tablet (12.5 mg total) by mouth 2 (two) times daily as needed for dizziness. 10 tablet 0   nystatin (MYCOSTATIN/NYSTOP) powder Apply 1 Dose topically daily.  3   pioglitazone (ACTOS) 15 MG tablet Take 1 tablet (15 mg total) by mouth daily. 30 tablet 3   Zoledronic Acid (ZOMETA) 4 MG/100ML IVPB Inject 4 mg into the vein every 3 (three) months.     No current facility-administered medications for this visit.     PHYSICAL EXAMINATION: ECOG PERFORMANCE STATUS: 1 - Symptomatic but completely ambulatory  Vitals:   06/11/19 0935  BP: 134/90  Pulse: (!) 102  Resp: 17  Temp: 98.2 F (36.8 C)  SpO2: 100%   Filed Weights   06/11/19 0935  Weight: 177 lb 11.2 oz (80.6 kg)    GENERAL: alert,  no distress and comfortable SKIN: skin color, texture, turgor are normal, no rashes or significant lesions EYES: normal, Conjunctiva are pink and non-injected, sclera clear OROPHARYNX: no exudate, no erythema and lips, buccal mucosa, and tongue normal  NECK: supple, thyroid normal size, non-tender, without nodularity LYMPH: no palpable lymphadenopathy in the cervical, axillary or inguinal LUNGS: clear to auscultation and percussion with normal breathing effort HEART: regular rate & rhythm and no murmurs and no lower extremity edema ABDOMEN: Underneath the belly flap there is an area of raw and mildly foul-smelling area.  I suspect that this is either moisture or mild fungal reaction because she is also on dexamethasone. MUSCULOSKELETAL: no cyanosis of digits and no clubbing  NEURO: alert & oriented x 3 with fluent speech, no focal motor/sensory deficits EXTREMITIES: No lower extremity edema  LABORATORY DATA:  I have reviewed the data as listed CMP Latest Ref Rng & Units 06/11/2019 05/14/2019 05/08/2019  Glucose 70 - 99 mg/dL 108(H) 128(H) 169(H)  BUN 8 - 23 mg/dL 23 17 21   Creatinine 0.44 - 1.00 mg/dL 2.24(H) 1.98(H) 2.72(H)  Sodium 135 - 145 mmol/L 137 139 137  Potassium 3.5 -  5.1 mmol/L 4.2 4.2 4.0  Chloride 98 - 111 mmol/L 104 106 102  CO2 22 - 32 mmol/L 17(L) 23 21(L)  Calcium 8.9 - 10.3 mg/dL 9.7 9.4 9.2  Total Protein 6.5 - 8.1 g/dL 7.9 8.7(H) 9.0(H)  Total Bilirubin 0.3 - 1.2 mg/dL 0.4 0.3 0.4  Alkaline Phos 38 - 126 U/L 104 83 87  AST 15 - 41 U/L 25 21 21   ALT 0 - 44 U/L 12 9 6     Lab Results  Component Value Date   WBC 9.3 06/11/2019   HGB 10.1 (L) 06/11/2019   HCT 31.5 (L) 06/11/2019   MCV 104.7 (H) 06/11/2019   PLT 305 06/11/2019   NEUTROABS 6.8 06/11/2019    ASSESSMENT & PLAN:  Breast cancer metastasized to bone, right ([facility]) Metastatic breast cancer with bone metastases along with lung and liver metastases, progressed on Ibrance with anastrozole as well as Faslodex  with Ibrance 06/29/18- 04/23/19.  Radiology review: 05/23/2018 CT CAP:New liver metastases 3.1 cm,increase in lung nodules, increase in sclerotic lesion of T11 and T9and S1 vertebral body. Liver biopsy 06/19/2018: Metastatic breast cancer ER 100%, PR 0%, Ki-67 50%, HER-2 negative BRCA1 mutation: Negative  PDL 1: 0%and foundation 1:PI 3 kinase mutation was identified ----------------------------------------------------------------------------------------------------------------------------------------------------------------------- Current treatment: Faslodex with Alpelisib started 04/23/2019 Patient's insurance rejected Xgeva.OnZometa every 3 months.   Palliative radiation: 01/25/2019-02/07/2019  PET CT scan 04/20/2019: Large hypermetabolic mass in the right hepatic lobe increase in size from 3 cm to 13.7 x 10.1 cm with SUV of 14.5, diffuse hypermetabolic bone metastases also increased, left adrenal gland metastatic lesion, increase in size of bilateral lung nodules  Alpelisib toxicities: 1.  Diarrhea alternating with constipation causing rectal discomfort: Much improved 2.  Fatigue We are watching her blood sugars extremely closely.  Fungal rash underneath her belly flap: Dr. Marlana Salvage prescribed her antifungal cream.  We will call was long pharmacy to see why it has not been shipped yet. Today she receives Zometa infusion. I will discontinue dexamethasone today.  Return to clinic in 4 weeks for follow-up with labs and Faslodex injection.  No orders of the defined types were placed in this encounter.  The patient has a good understanding of the overall plan. she agrees with it. she will call with any problems that may develop before the next visit here.  Nicholas Lose, MD 06/11/2019  Julious Oka Dorshimer am acting as scribe for Dr. Nicholas Lose.  I have reviewed the above documentation for accuracy and completeness, and I agree with the above.

## 2019-06-11 ENCOUNTER — Telehealth: Payer: Self-pay | Admitting: Hematology and Oncology

## 2019-06-11 ENCOUNTER — Inpatient Hospital Stay: Payer: Medicare HMO

## 2019-06-11 ENCOUNTER — Inpatient Hospital Stay: Payer: Medicare HMO | Attending: Oncology

## 2019-06-11 ENCOUNTER — Inpatient Hospital Stay (HOSPITAL_BASED_OUTPATIENT_CLINIC_OR_DEPARTMENT_OTHER): Payer: Medicare HMO | Admitting: Hematology and Oncology

## 2019-06-11 ENCOUNTER — Other Ambulatory Visit: Payer: Self-pay

## 2019-06-11 VITALS — BP 167/69 | HR 84

## 2019-06-11 DIAGNOSIS — R5383 Other fatigue: Secondary | ICD-10-CM | POA: Insufficient documentation

## 2019-06-11 DIAGNOSIS — Z923 Personal history of irradiation: Secondary | ICD-10-CM | POA: Diagnosis not present

## 2019-06-11 DIAGNOSIS — Z9221 Personal history of antineoplastic chemotherapy: Secondary | ICD-10-CM | POA: Insufficient documentation

## 2019-06-11 DIAGNOSIS — Z79811 Long term (current) use of aromatase inhibitors: Secondary | ICD-10-CM | POA: Diagnosis not present

## 2019-06-11 DIAGNOSIS — Z79899 Other long term (current) drug therapy: Secondary | ICD-10-CM | POA: Insufficient documentation

## 2019-06-11 DIAGNOSIS — B369 Superficial mycosis, unspecified: Secondary | ICD-10-CM | POA: Diagnosis not present

## 2019-06-11 DIAGNOSIS — C50911 Malignant neoplasm of unspecified site of right female breast: Secondary | ICD-10-CM

## 2019-06-11 DIAGNOSIS — Z170 Estrogen receptor positive status [ER+]: Secondary | ICD-10-CM | POA: Insufficient documentation

## 2019-06-11 DIAGNOSIS — C7951 Secondary malignant neoplasm of bone: Secondary | ICD-10-CM

## 2019-06-11 DIAGNOSIS — C7800 Secondary malignant neoplasm of unspecified lung: Secondary | ICD-10-CM | POA: Diagnosis not present

## 2019-06-11 DIAGNOSIS — Z79818 Long term (current) use of other agents affecting estrogen receptors and estrogen levels: Secondary | ICD-10-CM | POA: Insufficient documentation

## 2019-06-11 DIAGNOSIS — Z95828 Presence of other vascular implants and grafts: Secondary | ICD-10-CM

## 2019-06-11 DIAGNOSIS — C787 Secondary malignant neoplasm of liver and intrahepatic bile duct: Secondary | ICD-10-CM | POA: Diagnosis not present

## 2019-06-11 LAB — CBC WITH DIFFERENTIAL (CANCER CENTER ONLY)
Abs Immature Granulocytes: 0.05 10*3/uL (ref 0.00–0.07)
Basophils Absolute: 0.1 10*3/uL (ref 0.0–0.1)
Basophils Relative: 1 %
Eosinophils Absolute: 0.2 10*3/uL (ref 0.0–0.5)
Eosinophils Relative: 2 %
HCT: 31.5 % — ABNORMAL LOW (ref 36.0–46.0)
Hemoglobin: 10.1 g/dL — ABNORMAL LOW (ref 12.0–15.0)
Immature Granulocytes: 1 %
Lymphocytes Relative: 11 %
Lymphs Abs: 1.1 10*3/uL (ref 0.7–4.0)
MCH: 33.6 pg (ref 26.0–34.0)
MCHC: 32.1 g/dL (ref 30.0–36.0)
MCV: 104.7 fL — ABNORMAL HIGH (ref 80.0–100.0)
Monocytes Absolute: 1.2 10*3/uL — ABNORMAL HIGH (ref 0.1–1.0)
Monocytes Relative: 13 %
Neutro Abs: 6.8 10*3/uL (ref 1.7–7.7)
Neutrophils Relative %: 72 %
Platelet Count: 305 10*3/uL (ref 150–400)
RBC: 3.01 MIL/uL — ABNORMAL LOW (ref 3.87–5.11)
RDW: 14.7 % (ref 11.5–15.5)
WBC Count: 9.3 10*3/uL (ref 4.0–10.5)
nRBC: 0 % (ref 0.0–0.2)

## 2019-06-11 LAB — CMP (CANCER CENTER ONLY)
ALT: 12 U/L (ref 0–44)
AST: 25 U/L (ref 15–41)
Albumin: 3.4 g/dL — ABNORMAL LOW (ref 3.5–5.0)
Alkaline Phosphatase: 104 U/L (ref 38–126)
Anion gap: 16 — ABNORMAL HIGH (ref 5–15)
BUN: 23 mg/dL (ref 8–23)
CO2: 17 mmol/L — ABNORMAL LOW (ref 22–32)
Calcium: 9.7 mg/dL (ref 8.9–10.3)
Chloride: 104 mmol/L (ref 98–111)
Creatinine: 2.24 mg/dL — ABNORMAL HIGH (ref 0.44–1.00)
GFR, Est AFR Am: 24 mL/min — ABNORMAL LOW (ref >60)
GFR, Estimated: 21 mL/min — ABNORMAL LOW (ref >60)
Glucose, Bld: 108 mg/dL — ABNORMAL HIGH (ref 70–99)
Potassium: 4.2 mmol/L (ref 3.5–5.1)
Sodium: 137 mmol/L (ref 135–145)
Total Bilirubin: 0.4 mg/dL (ref 0.3–1.2)
Total Protein: 7.9 g/dL (ref 6.5–8.1)

## 2019-06-11 LAB — LIPID PANEL
Cholesterol: 177 mg/dL (ref 0–200)
HDL: 56 mg/dL (ref >40)
LDL Cholesterol: 99 mg/dL (ref 0–99)
Total CHOL/HDL Ratio: 3.2 RATIO
Triglycerides: 109 mg/dL (ref <150)
VLDL: 22 mg/dL (ref 0–40)

## 2019-06-11 MED ORDER — SODIUM CHLORIDE 0.9 % IJ SOLN
10.0000 mL | INTRAMUSCULAR | Status: DC | PRN
  Administered 2019-06-11: 10 mL via INTRAVENOUS
  Filled 2019-06-11: qty 10

## 2019-06-11 MED ORDER — FULVESTRANT 250 MG/5ML IM SOLN
500.0000 mg | Freq: Once | INTRAMUSCULAR | Status: AC
  Administered 2019-06-11: 12:00:00 500 mg via INTRAMUSCULAR

## 2019-06-11 MED ORDER — HEPARIN SOD (PORK) LOCK FLUSH 100 UNIT/ML IV SOLN
500.0000 [IU] | Freq: Once | INTRAVENOUS | Status: AC | PRN
  Administered 2019-06-11: 12:00:00 500 [IU] via INTRAVENOUS
  Filled 2019-06-11: qty 5

## 2019-06-11 MED ORDER — ZOLEDRONIC ACID 4 MG/5ML IV CONC
3.0000 mg | Freq: Once | INTRAVENOUS | Status: AC
  Administered 2019-06-11: 11:00:00 3 mg via INTRAVENOUS
  Filled 2019-06-11: qty 3.75

## 2019-06-11 MED ORDER — FULVESTRANT 250 MG/5ML IM SOLN
INTRAMUSCULAR | Status: AC
  Filled 2019-06-11: qty 10

## 2019-06-11 MED ORDER — SODIUM CHLORIDE 0.9 % IV SOLN
Freq: Once | INTRAVENOUS | Status: AC
  Administered 2019-06-11: 11:00:00 via INTRAVENOUS
  Filled 2019-06-11: qty 250

## 2019-06-11 NOTE — Telephone Encounter (Signed)
I could not reach regarding schedule the voicemail has not been setup

## 2019-06-11 NOTE — Patient Instructions (Signed)
Zoledronic Acid injection (Hypercalcemia, Oncology) What is this medicine? ZOLEDRONIC ACID (ZOE le dron ik AS id) lowers the amount of calcium loss from bone. It is used to treat too much calcium in your blood from cancer. It is also used to prevent complications of cancer that has spread to the bone. This medicine may be used for other purposes; ask your health care provider or pharmacist if you have questions. COMMON BRAND NAME(S): Zometa What should I tell my health care provider before I take this medicine? They need to know if you have any of these conditions:  aspirin-sensitive asthma  cancer, especially if you are receiving medicines used to treat cancer  dental disease or wear dentures  infection  kidney disease  receiving corticosteroids like dexamethasone or prednisone  an unusual or allergic reaction to zoledronic acid, other medicines, foods, dyes, or preservatives  pregnant or trying to get pregnant  breast-feeding How should I use this medicine? This medicine is for infusion into a vein. It is given by a health care professional in a hospital or clinic setting. Talk to your pediatrician regarding the use of this medicine in children. Special care may be needed. Overdosage: If you think you have taken too much of this medicine contact a poison control center or emergency room at once. NOTE: This medicine is only for you. Do not share this medicine with others. What if I miss a dose? It is important not to miss your dose. Call your doctor or health care professional if you are unable to keep an appointment. What may interact with this medicine?  certain antibiotics given by injection  NSAIDs, medicines for pain and inflammation, like ibuprofen or naproxen  some diuretics like bumetanide, furosemide  teriparatide  thalidomide This list may not describe all possible interactions. Give your health care provider a list of all the medicines, herbs, non-prescription  drugs, or dietary supplements you use. Also tell them if you smoke, drink alcohol, or use illegal drugs. Some items may interact with your medicine. What should I watch for while using this medicine? Visit your doctor or health care professional for regular checkups. It may be some time before you see the benefit from this medicine. Do not stop taking your medicine unless your doctor tells you to. Your doctor may order blood tests or other tests to see how you are doing. Women should inform their doctor if they wish to become pregnant or think they might be pregnant. There is a potential for serious side effects to an unborn child. Talk to your health care professional or pharmacist for more information. You should make sure that you get enough calcium and vitamin D while you are taking this medicine. Discuss the foods you eat and the vitamins you take with your health care professional. Some people who take this medicine have severe bone, joint, and/or muscle pain. This medicine may also increase your risk for jaw problems or a broken thigh bone. Tell your doctor right away if you have severe pain in your jaw, bones, joints, or muscles. Tell your doctor if you have any pain that does not go away or that gets worse. Tell your dentist and dental surgeon that you are taking this medicine. You should not have major dental surgery while on this medicine. See your dentist to have a dental exam and fix any dental problems before starting this medicine. Take good care of your teeth while on this medicine. Make sure you see your dentist for regular follow-up   appointments. What side effects may I notice from receiving this medicine? Side effects that you should report to your doctor or health care professional as soon as possible:  allergic reactions like skin rash, itching or hives, swelling of the face, lips, or tongue  anxiety, confusion, or depression  breathing problems  changes in vision  eye  pain  feeling faint or lightheaded, falls  jaw pain, especially after dental work  mouth sores  muscle cramps, stiffness, or weakness  redness, blistering, peeling or loosening of the skin, including inside the mouth  trouble passing urine or change in the amount of urine Side effects that usually do not require medical attention (report to your doctor or health care professional if they continue or are bothersome):  bone, joint, or muscle pain  constipation  diarrhea  fever  hair loss  irritation at site where injected  loss of appetite  nausea, vomiting  stomach upset  trouble sleeping  trouble swallowing  weak or tired This list may not describe all possible side effects. Call your doctor for medical advice about side effects. You may report side effects to FDA at 1-800-FDA-1088. Where should I keep my medicine? This drug is given in a hospital or clinic and will not be stored at home. NOTE: This sheet is a summary. It may not cover all possible information. If you have questions about this medicine, talk to your doctor, pharmacist, or health care provider.  2020 Elsevier/Gold Standard (2014-03-16 14:19:39) Fulvestrant injection What is this medicine? FULVESTRANT (ful VES trant) blocks the effects of estrogen. It is used to treat breast cancer. This medicine may be used for other purposes; ask your health care provider or pharmacist if you have questions. COMMON BRAND NAME(S): FASLODEX What should I tell my health care provider before I take this medicine? They need to know if you have any of these conditions:  bleeding disorders  liver disease  low blood counts, like low white cell, platelet, or red cell counts  an unusual or allergic reaction to fulvestrant, other medicines, foods, dyes, or preservatives  pregnant or trying to get pregnant  breast-feeding How should I use this medicine? This medicine is for injection into a muscle. It is usually  given by a health care professional in a hospital or clinic setting. Talk to your pediatrician regarding the use of this medicine in children. Special care may be needed. Overdosage: If you think you have taken too much of this medicine contact a poison control center or emergency room at once. NOTE: This medicine is only for you. Do not share this medicine with others. What if I miss a dose? It is important not to miss your dose. Call your doctor or health care professional if you are unable to keep an appointment. What may interact with this medicine?  medicines that treat or prevent blood clots like warfarin, enoxaparin, dalteparin, apixaban, dabigatran, and rivaroxaban This list may not describe all possible interactions. Give your health care provider a list of all the medicines, herbs, non-prescription drugs, or dietary supplements you use. Also tell them if you smoke, drink alcohol, or use illegal drugs. Some items may interact with your medicine. What should I watch for while using this medicine? Your condition will be monitored carefully while you are receiving this medicine. You will need important blood work done while you are taking this medicine. Do not become pregnant while taking this medicine or for at least 1 year after stopping it. Women of child-bearing potential   will need to have a negative pregnancy test before starting this medicine. Women should inform their doctor if they wish to become pregnant or think they might be pregnant. There is a potential for serious side effects to an unborn child. Men should inform their doctors if they wish to father a child. This medicine may lower sperm counts. Talk to your health care professional or pharmacist for more information. Do not breast-feed an infant while taking this medicine or for 1 year after the last dose. What side effects may I notice from receiving this medicine? Side effects that you should report to your doctor or health care  professional as soon as possible:  allergic reactions like skin rash, itching or hives, swelling of the face, lips, or tongue  feeling faint or lightheaded, falls  pain, tingling, numbness, or weakness in the legs  signs and symptoms of infection like fever or chills; cough; flu-like symptoms; sore throat  vaginal bleeding Side effects that usually do not require medical attention (report to your doctor or health care professional if they continue or are bothersome):  aches, pains  constipation  diarrhea  headache  hot flashes  nausea, vomiting  pain at site where injected  stomach pain This list may not describe all possible side effects. Call your doctor for medical advice about side effects. You may report side effects to FDA at 1-800-FDA-1088. Where should I keep my medicine? This drug is given in a hospital or clinic and will not be stored at home. NOTE: This sheet is a summary. It may not cover all possible information. If you have questions about this medicine, talk to your doctor, pharmacist, or health care provider.  2020 Elsevier/Gold Standard (2018-01-26 11:34:41)  

## 2019-06-12 DIAGNOSIS — C50911 Malignant neoplasm of unspecified site of right female breast: Secondary | ICD-10-CM | POA: Diagnosis not present

## 2019-06-12 DIAGNOSIS — R29898 Other symptoms and signs involving the musculoskeletal system: Secondary | ICD-10-CM | POA: Diagnosis not present

## 2019-06-12 DIAGNOSIS — C7951 Secondary malignant neoplasm of bone: Secondary | ICD-10-CM | POA: Diagnosis not present

## 2019-06-12 DIAGNOSIS — R627 Adult failure to thrive: Secondary | ICD-10-CM | POA: Diagnosis not present

## 2019-06-14 DIAGNOSIS — R627 Adult failure to thrive: Secondary | ICD-10-CM | POA: Diagnosis not present

## 2019-06-14 DIAGNOSIS — C7951 Secondary malignant neoplasm of bone: Secondary | ICD-10-CM | POA: Diagnosis not present

## 2019-06-14 DIAGNOSIS — R29898 Other symptoms and signs involving the musculoskeletal system: Secondary | ICD-10-CM | POA: Diagnosis not present

## 2019-06-14 DIAGNOSIS — C50911 Malignant neoplasm of unspecified site of right female breast: Secondary | ICD-10-CM | POA: Diagnosis not present

## 2019-06-18 ENCOUNTER — Telehealth: Payer: Self-pay

## 2019-06-18 NOTE — Telephone Encounter (Signed)
Copied from [place] [id number]. Topic: Referral - Question >> Jun 18, 2019  2:37 PM Sheran Luz wrote: Langley Gauss with Hospice calling regarding referral. She inquired if it was intentionally canceled so they can remove it from their que

## 2019-06-18 NOTE — Telephone Encounter (Signed)
Do you know anything about this? 

## 2019-06-19 ENCOUNTER — Telehealth: Payer: Self-pay | Admitting: Primary Care

## 2019-06-19 DIAGNOSIS — R29898 Other symptoms and signs involving the musculoskeletal system: Secondary | ICD-10-CM | POA: Diagnosis not present

## 2019-06-19 DIAGNOSIS — R627 Adult failure to thrive: Secondary | ICD-10-CM | POA: Diagnosis not present

## 2019-06-19 DIAGNOSIS — C7951 Secondary malignant neoplasm of bone: Secondary | ICD-10-CM | POA: Diagnosis not present

## 2019-06-19 DIAGNOSIS — C50911 Malignant neoplasm of unspecified site of right female breast: Secondary | ICD-10-CM | POA: Diagnosis not present

## 2019-06-19 NOTE — Telephone Encounter (Signed)
Spoke with patient regarding Palliative services and she was in agreement with this.  I have scheduled a Telephone Palliative Consult for 06/21/19 @ 1:30 PM

## 2019-06-19 NOTE — Telephone Encounter (Signed)
Spoke to [place] at [employer]

## 2019-06-20 DIAGNOSIS — C7951 Secondary malignant neoplasm of bone: Secondary | ICD-10-CM | POA: Diagnosis not present

## 2019-06-20 DIAGNOSIS — R627 Adult failure to thrive: Secondary | ICD-10-CM | POA: Diagnosis not present

## 2019-06-20 DIAGNOSIS — C50911 Malignant neoplasm of unspecified site of right female breast: Secondary | ICD-10-CM | POA: Diagnosis not present

## 2019-06-20 DIAGNOSIS — R29898 Other symptoms and signs involving the musculoskeletal system: Secondary | ICD-10-CM | POA: Diagnosis not present

## 2019-06-21 ENCOUNTER — Other Ambulatory Visit: Payer: Self-pay | Admitting: Primary Care

## 2019-06-21 DIAGNOSIS — C7951 Secondary malignant neoplasm of bone: Secondary | ICD-10-CM | POA: Diagnosis not present

## 2019-06-21 DIAGNOSIS — C50911 Malignant neoplasm of unspecified site of right female breast: Secondary | ICD-10-CM | POA: Diagnosis not present

## 2019-06-21 DIAGNOSIS — R29898 Other symptoms and signs involving the musculoskeletal system: Secondary | ICD-10-CM | POA: Diagnosis not present

## 2019-06-21 DIAGNOSIS — R627 Adult failure to thrive: Secondary | ICD-10-CM | POA: Diagnosis not present

## 2019-06-25 ENCOUNTER — Other Ambulatory Visit: Payer: Medicare HMO | Admitting: Adult Health Nurse Practitioner

## 2019-06-25 ENCOUNTER — Other Ambulatory Visit: Payer: Self-pay

## 2019-06-25 DIAGNOSIS — Z515 Encounter for palliative care: Secondary | ICD-10-CM

## 2019-06-25 NOTE — Progress Notes (Signed)
[facility] Consult Note Telephone: [phone]  Fax: [phone]  PATIENT NAME: [name] [name] DOB: 07/27/1941 MRN: [id number]  PRIMARY CARE PROVIDER:   Cassandria Anger, MD  REFERRING PROVIDER:  Cassandria Anger, MD [place],  [place] [address]  RESPONSIBLE PARTY:   Self [phone]  Due to the COVID-19 crisis, this visit was done via telemedicine and it was initiated and consent by this patient and or family. Video-audio (telehealth) contact was unable to be done due to technical barriers from the patients side.   RECOMMENDATIONS and PLAN:  1.  Advanced care planning.  Did not go over today as patient was frustrated with current situation.  Did go over that she is unable to use her dominant right arm which makes it difficult for her to write checks and prepare meals.  Have reached out to SW to go over available resources for her.  Have follow up in person visit in one week.  2. Breast cancer with mets.  She is currently on Piqray 200 mg daily.  She states that she has been having nausea and constipation.  States that she took herself off of the [facility] for a few days and that stopping it did help relieve the nausea and some of the constipation.  She does state that she has an appetite but she gets early satiety and is not eating as much. On 7/13 she weighed 184 and on 8/10 she weighed 177. Encourage frequent smaller meals.  Can use Remeron to help stimulate appetite which can also help with depression and insomnia.  She did say that she has problems falling asleep.  Dronabinol is also effective to stimulate the appetite.    2.  DVT.  From PCP notes she has DVT of axillary vein of right upper extremity.  This has caused swelling and pain.  She states that she has numbness from her fingers to her shoulder in her right arm but also states having pain in her right hand around her little finger.  When asked to described the pain  she just states that it is a pain that is a 7-8 out of 10 on the pain scale.  When asked if it is a burning, tingling, shooting pain she said no. States that this has been going on for 8-9 months.  She has been started on Eliquis.  She also states that she was told it could possibly be a pinched nerve.  She is frustrated because no workup has been done to evaluate for this.  Therapy has been ordered for her at home but this has not started yet due to the therapist's schedule being backed up.  She has tried Tylenol for the pain with no relief.  Do not recommend NSAIDs due to risk of bleeding while on Eliquis.  Could try Tramadol 25-50 mg Q6 hrs to see if this helps with the pain.   Have reached out to PCP via Epic email with above recommendations and concerns.   I spent 60 minutes providing this consultation,  from 1:30 to 2:30. More than 50% of the time in this consultation was spent coordinating communication.   HISTORY OF PRESENT ILLNESS:  [name] [name] is a 78 y.o. year old female with multiple medical problems including breast cancer with mets to liver, lung, and bones; HTN, DMT2, OA, h/o TIA. Palliative Care was asked to help address goals of care.   CODE STATUS: see above  PPS: 50%  HOSPICE ELIGIBILITY/DIAGNOSIS: TBD  PAST MEDICAL HISTORY:  Past Medical History:  Diagnosis Date   Anxiety    hx of   Asthma    Breast cancer ([place])    2002; metastatic in 2012, right breast, spread to left hip in 2012   Cerebrovascular accident [facility])    R thalamic CVA 01/2010   Chemotherapy induced neutropenia ([place]) 08/15/2017   Dyslipidemia    Encounter for therapeutic drug monitoring 04/14/2017   Goals of care, counseling/discussion 04/19/2017   Hyperlipemia    Hypertension    Low back pain    Osteoarthritis    TIA (transient ischemic attack)    x2 in 2011   Vertigo 2014    SOCIAL HX:  Social History   Tobacco Use   Smoking status: Former Smoker    Packs/day: 1.00    Years:  40.00    Pack years: 40.00    Types: Cigarettes    Quit date: 02/12/2011    Years since quitting: 8.3   Smokeless tobacco: Never Used  Substance Use Topics   Alcohol use: Yes    Comment: sometimes daily, some occasional    ALLERGIES: No Known Allergies   PERTINENT MEDICATIONS:  Outpatient Encounter Medications as of 06/25/2019  Medication Sig   acetaminophen (TYLENOL) 500 MG tablet Take 500 mg by mouth daily as needed (arm pain).   alpelisib (PIQRAY 200MG  DAILY DOSE) 200 MG Therapy Pack Take one 200mg  tablet with food at the same time daily. Swallow whole, do not crush, chew, or split.   ALPRAZolam (XANAX) 0.5 MG tablet Take 1 tablet (0.5 mg total) by mouth 2 (two) times daily as needed for anxiety or sleep.   amLODipine (NORVASC) 10 MG tablet Take 1 tablet (10 mg total) by mouth daily.   apixaban (ELIQUIS) 5 MG TABS tablet Take 1 tablet (5 mg total) by mouth 2 (two) times daily. Take 10 mg bid for 7 days orally and then decrease to 5 mg bid orally thereafter.   Cholecalciferol (VITAMIN D3) 1000 UNITS CAPS Take 1 capsule by mouth daily.    clotrimazole-betamethasone (LOTRISONE) cream Apply 1 application topically 2 (two) times daily.   gabapentin (NEURONTIN) 100 MG capsule Take 1 capsule (100 mg total) by mouth at bedtime.   hydrochlorothiazide (HYDRODIURIL) 25 MG tablet Take 25 mg by mouth daily.   losartan (COZAAR) 100 MG tablet Take 100 mg by mouth daily.   meclizine (ANTIVERT) 12.5 MG tablet Take 1 tablet (12.5 mg total) by mouth 2 (two) times daily as needed for dizziness.   nystatin (MYCOSTATIN/NYSTOP) powder Apply 1 Dose topically daily.   pioglitazone (ACTOS) 15 MG tablet Take 1 tablet (15 mg total) by mouth daily.   Zoledronic Acid (ZOMETA) 4 MG/100ML IVPB Inject 4 mg into the vein every 3 (three) months.   No facility-administered encounter medications on file as of 06/25/2019.        Jovan Schickling Jenetta Downer, NP

## 2019-06-26 ENCOUNTER — Telehealth: Payer: Self-pay | Admitting: Hematology and Oncology

## 2019-06-26 DIAGNOSIS — R29898 Other symptoms and signs involving the musculoskeletal system: Secondary | ICD-10-CM | POA: Diagnosis not present

## 2019-06-26 DIAGNOSIS — C7951 Secondary malignant neoplasm of bone: Secondary | ICD-10-CM | POA: Diagnosis not present

## 2019-06-26 DIAGNOSIS — R627 Adult failure to thrive: Secondary | ICD-10-CM | POA: Diagnosis not present

## 2019-06-26 DIAGNOSIS — C50911 Malignant neoplasm of unspecified site of right female breast: Secondary | ICD-10-CM | POA: Diagnosis not present

## 2019-06-26 NOTE — Telephone Encounter (Signed)
R/S appts per 8/24 sch message - pt is aware of new appt date and time

## 2019-06-27 DIAGNOSIS — C50911 Malignant neoplasm of unspecified site of right female breast: Secondary | ICD-10-CM | POA: Diagnosis not present

## 2019-06-27 DIAGNOSIS — C7951 Secondary malignant neoplasm of bone: Secondary | ICD-10-CM | POA: Diagnosis not present

## 2019-06-27 DIAGNOSIS — R29898 Other symptoms and signs involving the musculoskeletal system: Secondary | ICD-10-CM | POA: Diagnosis not present

## 2019-06-27 DIAGNOSIS — R627 Adult failure to thrive: Secondary | ICD-10-CM | POA: Diagnosis not present

## 2019-06-29 MED FILL — traMADol HCL 50 MG TABS: 50 | 14 days supply | Qty: 28 | Fill #0

## 2019-06-29 MED FILL — MIRTAZAPINE 7.5 MG TABLET: 7.5 | 30 days supply | Qty: 30 | Fill #0

## 2019-07-02 ENCOUNTER — Other Ambulatory Visit: Payer: Self-pay

## 2019-07-02 ENCOUNTER — Telehealth: Payer: Self-pay | Admitting: Adult Health Nurse Practitioner

## 2019-07-02 ENCOUNTER — Other Ambulatory Visit: Payer: Medicare HMO | Admitting: Adult Health Nurse Practitioner

## 2019-07-02 DIAGNOSIS — Z515 Encounter for palliative care: Secondary | ICD-10-CM | POA: Diagnosis not present

## 2019-07-02 NOTE — Telephone Encounter (Signed)
Dr. Alain Marion in agreement with palliative recommendations and would like me to prescribe.  Have prescribed Remeron 7.5 mg QHS for appetite stimulant and Tramadol 25 mg Q6 hours PRN for pain.  Have talked with patient about these meds being eprescribed to her pharmacy [facility] and side effects of the meds.  She expressed understanding.  Have follow up with patient next week.   Danie Hannig K. Olena Heckle NP

## 2019-07-02 NOTE — Progress Notes (Signed)
[facility] Consult Note Telephone: [phone]  Fax: [phone]  PATIENT NAME: [name] [name] DOB: [birth year] MRN: [id number]  PRIMARY CARE PROVIDER:   Cassandria Anger, MD  REFERRING PROVIDER:  Cassandria Anger, MD [place],   [id number]  RESPONSIBLE PARTY:   Self [phone] [name] [name], son, emergency contact [phone] [name] [name], friend, emergency contact 2  [phone]    RECOMMENDATIONS and PLAN:  1.  Advanced care planning.  Patient wants to be a full code with full interventions and feeding tube if needed.  States that some of her family live out [place] and would like to have what can be done to prolong life to give family a chance to see her before passing.    2.  Pain.  She states that the 25 mg of tramadol does give her relief with no side effects of sleepiness or nausea.  Continue current dose of tramadol.  3.  Breast cancer with mets.  States that she has not being taking her Piqray due to it causing constipation. States she has tried Miralax with little relief.  Has appointment with oncologist next week and encouraged her to talk about this with him.  4.  DVT.  Patient has DVT to right arm.  States that she stopped her Eliquis when she thought she was told it was a pinched nerve.  Explained to her the imaging did show a DVT and that she needed to restart her Eliquis.  She expressed understanding and found her Eliquis so she could restart it.  She does have significant loss of function in the right arm due to the swelling from the DVT and states that therapy through home health was supposed to start but has not come in yet.  She could not remember the name of the [facility] agency.  Have reached out to our RN clinical navigator to see if we could find out the [facility] agency and get her the therapy she needs to be more independent in the home.  Also reached out to SW to help patient with resources to get help  in the home.  I spent 60 minutes providing this consultation,  from 1:00 to 2:00. More than 50% of the time in this consultation was spent coordinating communication.   HISTORY OF PRESENT ILLNESS:  [name] [name] is a 78 y.o. year old female with multiple medical problems including breast cancer with mets to liver, lung, and bones; HTN, DMT2, OA, h/o TIA. Palliative Care was asked to help address goals of care.   CODE STATUS: Full Code  PPS: 50% HOSPICE ELIGIBILITY/DIAGNOSIS: TBD  PHYSICAL EXAM:   General: patient sitting in chair in NAD Extremities: Patient has nonpitting edema noted to upper right arm related to DVT, no joint deformities Skin: no rashes Neurological: Weakness but otherwise nonfocal  PAST MEDICAL HISTORY:  Past Medical History:  Diagnosis Date   Anxiety    hx of   Asthma    Breast cancer ([place])    2002; metastatic in 2012, right breast, spread to left hip in 2012   Cerebrovascular accident [facility])    R thalamic CVA 01/2010   Chemotherapy induced neutropenia ([place]) 08/15/2017   Dyslipidemia    Encounter for therapeutic drug monitoring 04/14/2017   Goals of care, counseling/discussion 04/19/2017   Hyperlipemia    Hypertension    Low back pain    Osteoarthritis    TIA (transient ischemic attack)    x2 in  2011   Vertigo 2014    SOCIAL HX:  Social History   Tobacco Use   Smoking status: Former Smoker    Packs/day: 1.00    Years: 40.00    Pack years: 40.00    Types: Cigarettes    Quit date: 02/12/2011    Years since quitting: 8.3   Smokeless tobacco: Never Used  Substance Use Topics   Alcohol use: Yes    Comment: sometimes daily, some occasional    ALLERGIES: No Known Allergies   PERTINENT MEDICATIONS:  Outpatient Encounter Medications as of 07/02/2019  Medication Sig   acetaminophen (TYLENOL) 500 MG tablet Take 500 mg by mouth daily as needed (arm pain).   alpelisib (PIQRAY 200MG  DAILY DOSE) 200 MG Therapy Pack Take one 200mg   tablet with food at the same time daily. Swallow whole, do not crush, chew, or split.   ALPRAZolam (XANAX) 0.5 MG tablet Take 1 tablet (0.5 mg total) by mouth 2 (two) times daily as needed for anxiety or sleep.   amLODipine (NORVASC) 10 MG tablet Take 1 tablet (10 mg total) by mouth daily.   apixaban (ELIQUIS) 5 MG TABS tablet Take 1 tablet (5 mg total) by mouth 2 (two) times daily. Take 10 mg bid for 7 days orally and then decrease to 5 mg bid orally thereafter.   Cholecalciferol (VITAMIN D3) 1000 UNITS CAPS Take 1 capsule by mouth daily.    clotrimazole-betamethasone (LOTRISONE) cream Apply 1 application topically 2 (two) times daily.   gabapentin (NEURONTIN) 100 MG capsule Take 1 capsule (100 mg total) by mouth at bedtime.   hydrochlorothiazide (HYDRODIURIL) 25 MG tablet Take 25 mg by mouth daily.   losartan (COZAAR) 100 MG tablet Take 100 mg by mouth daily.   meclizine (ANTIVERT) 12.5 MG tablet Take 1 tablet (12.5 mg total) by mouth 2 (two) times daily as needed for dizziness.   nystatin (MYCOSTATIN/NYSTOP) powder Apply 1 Dose topically daily.   pioglitazone (ACTOS) 15 MG tablet Take 1 tablet (15 mg total) by mouth daily.   Zoledronic Acid (ZOMETA) 4 MG/100ML IVPB Inject 4 mg into the vein every 3 (three) months.   No facility-administered encounter medications on file as of 07/02/2019.       [name] [name] Jenetta Downer, NP

## 2019-07-03 DIAGNOSIS — C50911 Malignant neoplasm of unspecified site of right female breast: Secondary | ICD-10-CM | POA: Diagnosis not present

## 2019-07-03 DIAGNOSIS — R29898 Other symptoms and signs involving the musculoskeletal system: Secondary | ICD-10-CM | POA: Diagnosis not present

## 2019-07-03 DIAGNOSIS — C7951 Secondary malignant neoplasm of bone: Secondary | ICD-10-CM | POA: Diagnosis not present

## 2019-07-03 DIAGNOSIS — R627 Adult failure to thrive: Secondary | ICD-10-CM | POA: Diagnosis not present

## 2019-07-05 ENCOUNTER — Other Ambulatory Visit: Payer: Self-pay

## 2019-07-05 ENCOUNTER — Other Ambulatory Visit: Payer: Medicare HMO | Admitting: Licensed Clinical Social Worker

## 2019-07-05 DIAGNOSIS — Z515 Encounter for palliative care: Secondary | ICD-10-CM

## 2019-07-05 DIAGNOSIS — R29898 Other symptoms and signs involving the musculoskeletal system: Secondary | ICD-10-CM | POA: Diagnosis not present

## 2019-07-05 DIAGNOSIS — C7951 Secondary malignant neoplasm of bone: Secondary | ICD-10-CM | POA: Diagnosis not present

## 2019-07-05 DIAGNOSIS — R627 Adult failure to thrive: Secondary | ICD-10-CM | POA: Diagnosis not present

## 2019-07-05 DIAGNOSIS — C50911 Malignant neoplasm of unspecified site of right female breast: Secondary | ICD-10-CM | POA: Diagnosis not present

## 2019-07-05 NOTE — Progress Notes (Signed)
COMMUNITY PALLIATIVE CARE SW NOTE  PATIENT NAME: [name] [name] DOB: 18-Jul-1941 MRN: [id number]  PRIMARY CARE PROVIDER: Cassandria Anger, MD  RESPONSIBLE PARTY:  Acct ID - Guarantor Home Phone Work Phone Relationship Acct Type  [id number] Dedra [phone]  Self P/F     [place], [place], [place] [address]   Due to the COVID-19 crisis, this virtual check-in visit was done via telephone from my office and it was initiated and consent given by this patient and or family.  PLAN OF CARE and INTERVENTIONS:             1. GOALS OF CARE/ ADVANCE CARE PLANNING:  Patient's goal is to obtain assistance with food preparation.  She is a full code. 2. SOCIAL/EMOTIONAL/SPIRITUAL ASSESSMENT/ INTERVENTIONS:  SW conducted a Sales executive visit with patient per the request of NP, Amy Olena Heckle.  Patient has lost the use of her right hand due to medical issues.  She has a good support system that assists with some of her ADLs.  A friend brings meals to her home when available.  Patient complained that she cannot even open a frozen dinner.  She used to be able to drive two months ago, but does not since the loss of her right hand and she is right handed.  SW and patient explored possible solutions.  SW contacted the volunteer department at [facility] to help fix meals.   3. PATIENT/CAREGIVER EDUCATION/ COPING:  Patient copes by expressing her feelings openly. 4. PERSONAL EMERGENCY PLAN:  Patient will contact EMS. 5. COMMUNITY RESOURCES COORDINATION/ HEALTH CARE NAVIGATION:  Physical therapy from home health agency. 6. FINANCIAL/LEGAL CONCERNS/INTERVENTIONS:  Patient has a fixed income.     SOCIAL HX:  Social History   Tobacco Use  . Smoking status: Former Smoker    Packs/day: 1.00    Years: 40.00    Pack years: 40.00    Types: Cigarettes    Quit date: 02/12/2011    Years since quitting: 8.3  . Smokeless tobacco: Never Used  Substance Use Topics  . Alcohol use: Yes    Comment:  sometimes daily, some occasional    CODE STATUS:  Full Code  ADVANCED DIRECTIVES: N MOST FORM COMPLETE:  Yes HOSPICE EDUCATION PROVIDED: N Duration of visit and documentation:  30 minutes.      Creola Corn Jette Lewan, LCSW

## 2019-07-06 ENCOUNTER — Telehealth: Payer: Self-pay | Admitting: Internal Medicine

## 2019-07-06 NOTE — Telephone Encounter (Signed)
Caller/Agency: [facility] Number: [phone] Ok to leave verbal on Voice Mail Requesting OT/PT/Skilled Nursing/Social Work/Speech Therapy: OT Frequency: 1 week 1, 2 week 4  Patient will need a compression sleeve for right upper extremity- can prescribtion be sent to [facility] [phone] Attention Marcie Bal

## 2019-07-06 NOTE — Telephone Encounter (Signed)
Ok Thx 

## 2019-07-10 DIAGNOSIS — R627 Adult failure to thrive: Secondary | ICD-10-CM | POA: Diagnosis not present

## 2019-07-10 DIAGNOSIS — C7951 Secondary malignant neoplasm of bone: Secondary | ICD-10-CM | POA: Diagnosis not present

## 2019-07-10 DIAGNOSIS — C50911 Malignant neoplasm of unspecified site of right female breast: Secondary | ICD-10-CM | POA: Diagnosis not present

## 2019-07-10 DIAGNOSIS — R29898 Other symptoms and signs involving the musculoskeletal system: Secondary | ICD-10-CM | POA: Diagnosis not present

## 2019-07-10 NOTE — Telephone Encounter (Signed)
Verbal orders given for below. Dr. Alain Marion, Please write rx on your pad and I will fax:  compression sleeve for right upper extremity-  [facility] [phone] Attention Marcie Bal

## 2019-07-10 NOTE — Progress Notes (Signed)
Patient Care Team: [name], [name], MD as PCP - General (Internal Medicine) Curt Bears, MD (Hematology and Oncology) Paralee Cancel, MD (Orthopedic Surgery) Tyler Pita, MD (Radiation Oncology) Nicholas Lose, MD as Consulting Physician (Hematology and Oncology) Pleasant, Eppie Gibson, RN as [facility] Management  DIAGNOSIS:    ICD-10-CM   1. Breast cancer metastasized to bone, right ([place])  C50.911 CT Abdomen Pelvis W Contrast   C79.51 CT Chest W Contrast    CBC with Differential (Cancer Center Only)    CMP ([place] only)    SUMMARY OF ONCOLOGIC HISTORY: Oncology History  Breast cancer metastasized to bone, right ([place])  05/22/2001 Initial Diagnosis   Rt breast: stage IIb (T2, N1, M0) invasive right breast carcinoma with positive estrogen and progesterone receptor as well as HER-2/neu   06/12/2001 Surgery   right lumpectomy with right axillary lymph node dissection revealing 4/9 lymph nodes that were positive for malignancy.    08/12/2001 - 01/10/2002 Chemotherapy   Adj chemo CMF X 8    05/07/2002 - 12/14/2007 Anti-estrogen oral therapy   Adj Tamoxifen for 2 years and then switched to Letrozole (by Dr.Khan in Aug 2005), stopped Feb 2009   02/11/2003 - 04/05/2003 Radiation Therapy   Adj XRT by Tammi Klippel   03/18/2011 -  Anti-estrogen oral therapy   Anastrozole 1 mg daily   04/01/2011 Surgery   Left total hip replacement secondary to pathologic left femoral neck fracture   04/25/2017 Relapse/Recurrence   Anastrozole with Ibrance discontinued 05/30/2018 due to disease progression, Xgeva for bone metastases every 3 months   06/19/2018 Procedure   Liver biopsy: Metastatic breast cancer, ER 100%, PR 0%, Ki-67 50%, HER-2 negative   07/06/2018 Miscellaneous   Foundation 1 results: Microsatellite stable, tumor mutational burden 15 mutations per MB, ATRX, CCND1, CDK4 amplification, ERBB4 amplification, ESR1 amplification, FGFR 1, MDM2 amplification, MEM1,  NST3 amplification, PIK3CA mutation, ZNF703 amplification; PDL 1: 0%   07/06/2018 -  Anti-estrogen oral therapy   Ibrance with Faslodex   01/26/2019 - 02/07/2019 Radiation Therapy   Palliative radiation to the neck and subclavian area   05/01/2019 Treatment Plan Change   Alpelisib 261m daily     CHIEF COMPLIANT: Follow-up of metastatic breast canceron Alpelisib  INTERVAL HISTORY: [name] BAKERis a 78y.o. with above-mentioned history of metastatic breast cancerwith metastases to the liver, lung, and boneswho is currently on treatment with Alpelisib.She presents to the clinic todayfor a toxicity check. She is complaining of right arm swelling and discomfort.  She is also noticed a new pain in her left thigh that is radiating down the leg.  She continues to have trouble with weight issues and has lost some weight again.  She is working with physical therapist for the arm swelling.  REVIEW OF SYSTEMS:   Constitutional: Denies fevers, chills or abnormal weight loss Eyes: Denies blurriness of vision Ears, nose, mouth, throat, and face: Mucositis present in the mouth Respiratory: Denies cough, dyspnea or wheezes Cardiovascular: Denies palpitation, chest discomfort Gastrointestinal: Denies nausea, heartburn or change in bowel habits Skin: Denies abnormal skin rashes Lymphatics: Denies new lymphadenopathy or easy bruising Neurological: Denies numbness, tingling or new weaknesses Behavioral/Psych: Mood is stable, no new changes  Extremities: Right upper extremity swelling Breast: denies any pain or lumps or nodules in either breasts All other systems were reviewed with the patient and are negative.  I have reviewed the past medical history, past surgical history, social history and family history with the patient and they  are unchanged from previous note.  ALLERGIES:  has No Known Allergies.  MEDICATIONS:  Current Outpatient Medications  Medication Sig Dispense Refill  .  acetaminophen (TYLENOL) 500 MG tablet Take 500 mg by mouth daily as needed (arm pain).    Marland Kitchen alpelisib (PIQRAY 200MG DAILY DOSE) 200 MG Therapy Pack Take one 225m tablet with food at the same time daily. Swallow whole, do not crush, chew, or split. 30 each 3  . ALPRAZolam (XANAX) 0.5 MG tablet Take 1 tablet (0.5 mg total) by mouth 2 (two) times daily as needed for anxiety or sleep. 60 tablet 3  . amLODipine (NORVASC) 10 MG tablet Take 1 tablet (10 mg total) by mouth daily. 90 tablet 3  . apixaban (ELIQUIS) 5 MG TABS tablet Take 1 tablet (5 mg total) by mouth 2 (two) times daily. Take 10 mg bid for 7 days orally and then decrease to 5 mg bid orally thereafter. 60 tablet 5  . Cholecalciferol (VITAMIN D3) 1000 UNITS CAPS Take 1 capsule by mouth daily.     . clotrimazole-betamethasone (LOTRISONE) cream Apply 1 application topically 2 (two) times daily. 90 g 1  . gabapentin (NEURONTIN) 100 MG capsule Take 1 capsule (100 mg total) by mouth at bedtime. 30 capsule 5  . hydrochlorothiazide (HYDRODIURIL) 25 MG tablet Take 25 mg by mouth daily.  3  . losartan (COZAAR) 100 MG tablet Take 100 mg by mouth daily.  3  . meclizine (ANTIVERT) 12.5 MG tablet Take 1 tablet (12.5 mg total) by mouth 2 (two) times daily as needed for dizziness. 10 tablet 0  . nystatin (MYCOSTATIN/NYSTOP) powder Apply 1 Dose topically daily.  3  . pioglitazone (ACTOS) 15 MG tablet Take 1 tablet (15 mg total) by mouth daily. 30 tablet 3  . Zoledronic Acid (ZOMETA) 4 MG/100ML IVPB Inject 4 mg into the vein every 3 (three) months.     No current facility-administered medications for this visit.     PHYSICAL EXAMINATION: ECOG PERFORMANCE STATUS: 2 - Symptomatic, <50% confined to bed  Vitals:   07/11/19 1544  BP: (!) 125/56  Pulse: 98  Resp: 16  Temp: 98.5 F (36.9 C)  SpO2: 98%   Filed Weights   07/11/19 1544  Weight: 170 lb 14.4 oz (77.5 kg)    GENERAL: alert, no distress and comfortable SKIN: skin color, texture, turgor  are normal, no rashes or significant lesions EYES: normal, Conjunctiva are pink and non-injected, sclera clear OROPHARYNX: no exudate, no erythema and lips, buccal mucosa, and tongue normal  NECK: supple, thyroid normal size, non-tender, without nodularity LYMPH: no palpable lymphadenopathy in the cervical, axillary or inguinal LUNGS: clear to auscultation and percussion with normal breathing effort HEART: regular rate & rhythm and no murmurs and no lower extremity edema ABDOMEN: abdomen soft, non-tender and normal bowel sounds MUSCULOSKELETAL: no cyanosis of digits and no clubbing  NEURO: alert & oriented x 3 with fluent speech, no focal motor/sensory deficits EXTREMITIES: No lower extremity edema  LABORATORY DATA:  I have reviewed the data as listed CMP Latest Ref Rng & Units 07/11/2019 06/11/2019 05/14/2019  Glucose 70 - 99 mg/dL 117(H) 108(H) 128(H)  BUN 8 - 23 mg/dL 28(H) 23 17  Creatinine 0.44 - 1.00 mg/dL 2.41(H) 2.24(H) 1.98(H)  Sodium 135 - 145 mmol/L 138 137 139  Potassium 3.5 - 5.1 mmol/L 4.1 4.2 4.2  Chloride 98 - 111 mmol/L 105 104 106  CO2 22 - 32 mmol/L 19(L) 17(L) 23  Calcium 8.9 - 10.3 mg/dL 9.1 9.7  9.4  Total Protein 6.5 - 8.1 g/dL 7.9 7.9 8.7(H)  Total Bilirubin 0.3 - 1.2 mg/dL 0.3 0.4 0.3  Alkaline Phos 38 - 126 U/L 119 104 83  AST 15 - 41 U/L 42(H) 25 21  ALT 0 - 44 U/L 17 12 9     Lab Results  Component Value Date   WBC 10.4 07/11/2019   HGB 10.3 (L) 07/11/2019   HCT 33.1 (L) 07/11/2019   MCV 101.2 (H) 07/11/2019   PLT 347 07/11/2019   NEUTROABS 7.0 07/11/2019    ASSESSMENT & PLAN:  Breast cancer metastasized to bone, right ([facility]) Metastatic breast cancer with bone metastases along with lung and liver metastases, progressed on Ibrance with anastrozoleas well as Faslodex with Ibrance8/29/19- 04/23/19.  Radiology review: 05/23/2018 CT CAP:New liver metastases 3.1 cm,increase in lung nodules, increase in sclerotic lesion of T11 and T9and S1 vertebral  body. Liver biopsy 06/19/2018: Metastatic breast cancer ER 100%, PR 0%, Ki-67 50%, HER-2 negative BRCA1 mutation: Negative  PDL 1: 0%and foundation 1:PI 3 kinase mutation was identified ----------------------------------------------------------------------------------------------------------------------------------------------------------------------- Current treatment:Faslodex with Alpelisib started 04/23/2019 Patient's insurance rejected Xgeva.OnZometa every 3 months.   Palliative radiation: 01/25/2019-02/07/2019  PET CT scan 04/20/2019: Large hypermetabolic mass in the right hepatic lobe increase in size from 3 cm to 13.7 x 10.1 cm with SUV of 14.5, diffuse hypermetabolic bone metastases also increased, left adrenal gland metastatic lesion, increase in size of bilateral lung nodules  Alpelisib toxicities: 1.Diarrhea alternating with constipation causing rectal discomfort: Much improved 2.Fatigue 3.  Rawness in the mouth: Encouraged to use salt water gargles.  We are watching her blood sugars extremely closely.  We will plan to obtain CT chest abdomen pelvis and follow-up in 1 month.    Orders Placed This Encounter  Procedures  . CT Abdomen Pelvis W Contrast    Standing Status:   Future    Standing Expiration Date:   07/10/2020    Order Specific Question:   ** REASON FOR EXAM (FREE TEXT)    Answer:   Met Breast cancer restaging    Order Specific Question:   If indicated for the ordered procedure, I authorize the administration of contrast media per Radiology protocol    Answer:   Yes    Order Specific Question:   Preferred imaging location?    Answer:   [facility]    Order Specific Question:   Is Oral Contrast requested for this exam?    Answer:   Yes, Per Radiology protocol    Order Specific Question:   Radiology Contrast Protocol - do NOT remove file path    Answer:   \\[email or web address].pdf  . CT Chest W Contrast    Standing Status:    Future    Standing Expiration Date:   07/10/2020    Order Specific Question:   ** REASON FOR EXAM (FREE TEXT)    Answer:   Met Breast cancer restaging    Order Specific Question:   If indicated for the ordered procedure, I authorize the administration of contrast media per Radiology protocol    Answer:   Yes    Order Specific Question:   Preferred imaging location?    Answer:   [facility]    Order Specific Question:   Radiology Contrast Protocol - do NOT remove file path    Answer:   \\[email or web address].pdf  . CBC with Differential ([place] Only)    Standing Status:   Future    Standing Expiration Date:   07/10/2020  .  CMP ([place] only)    Standing Status:   Future    Standing Expiration Date:   07/10/2020   The patient has a good understanding of the overall plan. she agrees with it. she will call with any problems that may develop before the next visit here.  Nicholas Lose, MD 07/11/2019  Julious Oka Dorshimer am acting as scribe for Dr. Nicholas Lose.  I have reviewed the above documentation for accuracy and completeness, and I agree with the above.

## 2019-07-10 NOTE — Telephone Encounter (Signed)
Okay.  Thanks.

## 2019-07-11 ENCOUNTER — Inpatient Hospital Stay (HOSPITAL_BASED_OUTPATIENT_CLINIC_OR_DEPARTMENT_OTHER): Payer: Medicare HMO | Admitting: Hematology and Oncology

## 2019-07-11 ENCOUNTER — Inpatient Hospital Stay: Payer: Medicare HMO | Attending: Oncology

## 2019-07-11 ENCOUNTER — Inpatient Hospital Stay: Payer: Medicare HMO

## 2019-07-11 ENCOUNTER — Other Ambulatory Visit: Payer: Self-pay

## 2019-07-11 VITALS — BP 125/56 | HR 98 | Temp 98.5°F | Resp 16 | Ht 63.0 in | Wt 170.9 lb

## 2019-07-11 DIAGNOSIS — Z79899 Other long term (current) drug therapy: Secondary | ICD-10-CM | POA: Diagnosis not present

## 2019-07-11 DIAGNOSIS — Z7901 Long term (current) use of anticoagulants: Secondary | ICD-10-CM | POA: Diagnosis not present

## 2019-07-11 DIAGNOSIS — C50911 Malignant neoplasm of unspecified site of right female breast: Secondary | ICD-10-CM

## 2019-07-11 DIAGNOSIS — R197 Diarrhea, unspecified: Secondary | ICD-10-CM | POA: Insufficient documentation

## 2019-07-11 DIAGNOSIS — Z9221 Personal history of antineoplastic chemotherapy: Secondary | ICD-10-CM | POA: Insufficient documentation

## 2019-07-11 DIAGNOSIS — Z923 Personal history of irradiation: Secondary | ICD-10-CM | POA: Diagnosis not present

## 2019-07-11 DIAGNOSIS — Z170 Estrogen receptor positive status [ER+]: Secondary | ICD-10-CM | POA: Insufficient documentation

## 2019-07-11 DIAGNOSIS — Z79811 Long term (current) use of aromatase inhibitors: Secondary | ICD-10-CM | POA: Diagnosis not present

## 2019-07-11 DIAGNOSIS — C7951 Secondary malignant neoplasm of bone: Secondary | ICD-10-CM

## 2019-07-11 DIAGNOSIS — C787 Secondary malignant neoplasm of liver and intrahepatic bile duct: Secondary | ICD-10-CM | POA: Diagnosis not present

## 2019-07-11 DIAGNOSIS — Z79818 Long term (current) use of other agents affecting estrogen receptors and estrogen levels: Secondary | ICD-10-CM | POA: Diagnosis not present

## 2019-07-11 DIAGNOSIS — C7800 Secondary malignant neoplasm of unspecified lung: Secondary | ICD-10-CM | POA: Insufficient documentation

## 2019-07-11 DIAGNOSIS — K5900 Constipation, unspecified: Secondary | ICD-10-CM | POA: Insufficient documentation

## 2019-07-11 DIAGNOSIS — Z95828 Presence of other vascular implants and grafts: Secondary | ICD-10-CM

## 2019-07-11 LAB — CBC WITH DIFFERENTIAL (CANCER CENTER ONLY)
Abs Immature Granulocytes: 0.07 10*3/uL (ref 0.00–0.07)
Basophils Absolute: 0 10*3/uL (ref 0.0–0.1)
Basophils Relative: 0 %
Eosinophils Absolute: 0 10*3/uL (ref 0.0–0.5)
Eosinophils Relative: 0 %
HCT: 33.1 % — ABNORMAL LOW (ref 36.0–46.0)
Hemoglobin: 10.3 g/dL — ABNORMAL LOW (ref 12.0–15.0)
Immature Granulocytes: 1 %
Lymphocytes Relative: 18 %
Lymphs Abs: 1.9 10*3/uL (ref 0.7–4.0)
MCH: 31.5 pg (ref 26.0–34.0)
MCHC: 31.1 g/dL (ref 30.0–36.0)
MCV: 101.2 fL — ABNORMAL HIGH (ref 80.0–100.0)
Monocytes Absolute: 1.4 10*3/uL — ABNORMAL HIGH (ref 0.1–1.0)
Monocytes Relative: 14 %
Neutro Abs: 7 10*3/uL (ref 1.7–7.7)
Neutrophils Relative %: 67 %
Platelet Count: 347 10*3/uL (ref 150–400)
RBC: 3.27 MIL/uL — ABNORMAL LOW (ref 3.87–5.11)
RDW: 15.4 % (ref 11.5–15.5)
WBC Count: 10.4 10*3/uL (ref 4.0–10.5)
nRBC: 0 % (ref 0.0–0.2)

## 2019-07-11 LAB — CMP (CANCER CENTER ONLY)
ALT: 17 U/L (ref 0–44)
AST: 42 U/L — ABNORMAL HIGH (ref 15–41)
Albumin: 3.5 g/dL (ref 3.5–5.0)
Alkaline Phosphatase: 119 U/L (ref 38–126)
Anion gap: 14 (ref 5–15)
BUN: 28 mg/dL — ABNORMAL HIGH (ref 8–23)
CO2: 19 mmol/L — ABNORMAL LOW (ref 22–32)
Calcium: 9.1 mg/dL (ref 8.9–10.3)
Chloride: 105 mmol/L (ref 98–111)
Creatinine: 2.41 mg/dL — ABNORMAL HIGH (ref 0.44–1.00)
GFR, Est AFR Am: 22 mL/min — ABNORMAL LOW (ref >60)
GFR, Estimated: 19 mL/min — ABNORMAL LOW (ref >60)
Glucose, Bld: 117 mg/dL — ABNORMAL HIGH (ref 70–99)
Potassium: 4.1 mmol/L (ref 3.5–5.1)
Sodium: 138 mmol/L (ref 135–145)
Total Bilirubin: 0.3 mg/dL (ref 0.3–1.2)
Total Protein: 7.9 g/dL (ref 6.5–8.1)

## 2019-07-11 LAB — LIPID PANEL
Cholesterol: 181 mg/dL (ref 0–200)
HDL: 66 mg/dL (ref >40)
LDL Cholesterol: 91 mg/dL (ref 0–99)
Total CHOL/HDL Ratio: 2.7 RATIO
Triglycerides: 119 mg/dL (ref <150)
VLDL: 24 mg/dL (ref 0–40)

## 2019-07-11 MED ORDER — FULVESTRANT 250 MG/5ML IM SOLN
INTRAMUSCULAR | Status: AC
  Filled 2019-07-11: qty 10

## 2019-07-11 MED ORDER — FULVESTRANT 250 MG/5ML IM SOLN
500.0000 mg | Freq: Once | INTRAMUSCULAR | Status: AC
  Administered 2019-07-11: 500 mg via INTRAMUSCULAR

## 2019-07-11 NOTE — Assessment & Plan Note (Signed)
Metastatic breast cancer with bone metastases along with lung and liver metastases, progressed on Ibrance with anastrozoleas well as Faslodex with Ibrance8/29/19- 04/23/19.  Radiology review: 05/23/2018 CT CAP:New liver metastases 3.1 cm,increase in lung nodules, increase in sclerotic lesion of T11 and T9and S1 vertebral body. Liver biopsy 06/19/2018: Metastatic breast cancer ER 100%, PR 0%, Ki-67 50%, HER-2 negative BRCA1 mutation: Negative  PDL 1: 0%and foundation 1:PI 3 kinase mutation was identified ----------------------------------------------------------------------------------------------------------------------------------------------------------------------- Current treatment:Faslodex with Alpelisib started 04/23/2019 Patient's insurance rejected Xgeva.OnZometa every 3 months.   Palliative radiation: 01/25/2019-02/07/2019  PET CT scan 04/20/2019: Large hypermetabolic mass in the right hepatic lobe increase in size from 3 cm to 13.7 x 10.1 cm with SUV of 14.5, diffuse hypermetabolic bone metastases also increased, left adrenal gland metastatic lesion, increase in size of bilateral lung nodules  Alpelisib toxicities: 1.Diarrhea alternating with constipation causing rectal discomfort: Much improved 2.Fatigue 3.  Rawness in the mouth: Encouraged to use salt water gargles.  We are watching her blood sugars extremely closely.  We will plan to obtain CT chest abdomen pelvis and follow-up in 1 month.

## 2019-07-12 ENCOUNTER — Ambulatory Visit: Payer: Medicare HMO | Admitting: Hematology and Oncology

## 2019-07-12 ENCOUNTER — Other Ambulatory Visit: Payer: Medicare HMO

## 2019-07-12 ENCOUNTER — Ambulatory Visit: Payer: Medicare HMO

## 2019-07-12 ENCOUNTER — Telehealth: Payer: Self-pay | Admitting: Hematology and Oncology

## 2019-07-12 DIAGNOSIS — C7951 Secondary malignant neoplasm of bone: Secondary | ICD-10-CM | POA: Diagnosis not present

## 2019-07-12 DIAGNOSIS — R29898 Other symptoms and signs involving the musculoskeletal system: Secondary | ICD-10-CM | POA: Diagnosis not present

## 2019-07-12 DIAGNOSIS — R627 Adult failure to thrive: Secondary | ICD-10-CM | POA: Diagnosis not present

## 2019-07-12 DIAGNOSIS — C50911 Malignant neoplasm of unspecified site of right female breast: Secondary | ICD-10-CM | POA: Diagnosis not present

## 2019-07-12 NOTE — Telephone Encounter (Signed)
I talk with patient regarding reschedule of lab to 10/9

## 2019-07-13 DIAGNOSIS — R627 Adult failure to thrive: Secondary | ICD-10-CM | POA: Diagnosis not present

## 2019-07-13 DIAGNOSIS — C50911 Malignant neoplasm of unspecified site of right female breast: Secondary | ICD-10-CM | POA: Diagnosis not present

## 2019-07-13 DIAGNOSIS — C7951 Secondary malignant neoplasm of bone: Secondary | ICD-10-CM | POA: Diagnosis not present

## 2019-07-13 DIAGNOSIS — R29898 Other symptoms and signs involving the musculoskeletal system: Secondary | ICD-10-CM | POA: Diagnosis not present

## 2019-07-17 DIAGNOSIS — C7951 Secondary malignant neoplasm of bone: Secondary | ICD-10-CM | POA: Diagnosis not present

## 2019-07-17 DIAGNOSIS — R29898 Other symptoms and signs involving the musculoskeletal system: Secondary | ICD-10-CM | POA: Diagnosis not present

## 2019-07-17 DIAGNOSIS — C50911 Malignant neoplasm of unspecified site of right female breast: Secondary | ICD-10-CM | POA: Diagnosis not present

## 2019-07-17 DIAGNOSIS — R627 Adult failure to thrive: Secondary | ICD-10-CM | POA: Diagnosis not present

## 2019-07-18 ENCOUNTER — Telehealth: Payer: Self-pay | Admitting: *Deleted

## 2019-07-18 ENCOUNTER — Telehealth: Payer: Self-pay

## 2019-07-18 ENCOUNTER — Other Ambulatory Visit: Payer: Self-pay | Admitting: *Deleted

## 2019-07-18 MED ORDER — ONDANSETRON HCL 8 MG PO TABS: 8.0000 mg | ORAL_TABLET | Freq: Three times a day (TID) | ORAL | 0 refills | Status: AC | PRN

## 2019-07-18 MED FILL — ONDANSETRON HCL 8 MG TABLET: 8 | 10 days supply | Qty: 30 | Fill #0

## 2019-07-18 NOTE — Telephone Encounter (Signed)
Received message from oral oncology stating pt informed them that she will no longer be taking Piqray.  RN called pt to follow up.  Pt stated she experienced nausea earlier this week after taking the Dwight and we was allowing her stomach to "rest" and would re start her medication tomorrow.  Per Dr. Lindi Adie, pt to start taking Zofran 8mg  p.o one hour prior to [facility] to prevent nausea.  Pt notified, verbalized understanding, and prescription sent to pharmacy.

## 2019-07-18 NOTE — Telephone Encounter (Signed)
Oral Oncology Patient Advocate Encounter  I received a email from [place] that the patient told them she was not going to continue taking the Tolstoy.  Arnold encouraged her to reach out the nurse/doctor before she made that decision.  [facility] Patient [place] Phone [phone] Fax [phone] 07/18/2019   11:47 AM

## 2019-07-20 DIAGNOSIS — R29898 Other symptoms and signs involving the musculoskeletal system: Secondary | ICD-10-CM | POA: Diagnosis not present

## 2019-07-20 DIAGNOSIS — C50911 Malignant neoplasm of unspecified site of right female breast: Secondary | ICD-10-CM | POA: Diagnosis not present

## 2019-07-20 DIAGNOSIS — C7951 Secondary malignant neoplasm of bone: Secondary | ICD-10-CM | POA: Diagnosis not present

## 2019-07-20 DIAGNOSIS — R627 Adult failure to thrive: Secondary | ICD-10-CM | POA: Diagnosis not present

## 2019-07-23 ENCOUNTER — Telehealth: Payer: Self-pay

## 2019-07-23 DIAGNOSIS — R29898 Other symptoms and signs involving the musculoskeletal system: Secondary | ICD-10-CM

## 2019-07-23 NOTE — Telephone Encounter (Signed)
faxed

## 2019-07-23 NOTE — Telephone Encounter (Signed)
Copied from [place] [id number]. Topic: General - Other >> Jul 20, 2019  1:27 PM Rainey Pines A wrote: Home health nurse from [facility] is requesting a callback in regards to compression sleeve order that has not been received yet. Best contact number is [phone]

## 2019-07-25 DIAGNOSIS — R29898 Other symptoms and signs involving the musculoskeletal system: Secondary | ICD-10-CM | POA: Diagnosis not present

## 2019-07-25 DIAGNOSIS — C7951 Secondary malignant neoplasm of bone: Secondary | ICD-10-CM | POA: Diagnosis not present

## 2019-07-25 DIAGNOSIS — C50911 Malignant neoplasm of unspecified site of right female breast: Secondary | ICD-10-CM | POA: Diagnosis not present

## 2019-07-25 DIAGNOSIS — R627 Adult failure to thrive: Secondary | ICD-10-CM | POA: Diagnosis not present

## 2019-07-25 NOTE — Telephone Encounter (Signed)
Radovan calling back with fax number: [phone].

## 2019-07-25 NOTE — Telephone Encounter (Signed)
Faxed

## 2019-07-27 DIAGNOSIS — R29898 Other symptoms and signs involving the musculoskeletal system: Secondary | ICD-10-CM | POA: Diagnosis not present

## 2019-07-27 DIAGNOSIS — R627 Adult failure to thrive: Secondary | ICD-10-CM | POA: Diagnosis not present

## 2019-07-27 DIAGNOSIS — C50911 Malignant neoplasm of unspecified site of right female breast: Secondary | ICD-10-CM | POA: Diagnosis not present

## 2019-07-27 DIAGNOSIS — C7951 Secondary malignant neoplasm of bone: Secondary | ICD-10-CM | POA: Diagnosis not present

## 2019-07-30 ENCOUNTER — Other Ambulatory Visit: Payer: Self-pay

## 2019-07-30 ENCOUNTER — Ambulatory Visit: Payer: Self-pay | Admitting: *Deleted

## 2019-07-30 NOTE — Patient Outreach (Signed)
[facility]) Care Management  [place]  07/30/2019   [name] [name] Jul 31, 1941 [id number]  Subjective: spoke with patient. She reports that she is doing pretty good. Patient is still waiting for compression sleeve.  She states that her therapist told her the order was received last week.  Patient states that she still has problems with preparing meals and opening packages due to no use of right arm.  Discussed possible solutions.  Hospice had looked for a volunteer but no avail.  Patient not sure if she qualifies for medicaid in order to receive PCS services.  Patient unable to pay for such service at this time and has limited support.  Discussed [facility] social worker to contact patient for possible medicaid.  Patient is agreeable.  Patient states he appetite poor but eats when she wants.  Encouraged patient to maintain fluid intake and eat small meals.    Objective:   Encounter Medications:  Outpatient Encounter Medications as of 07/30/2019  Medication Sig  . acetaminophen (TYLENOL) 500 MG tablet Take 500 mg by mouth daily as needed (arm pain).  Marland Kitchen alpelisib (PIQRAY 200MG DAILY DOSE) 200 MG Therapy Pack Take one 276m tablet with food at the same time daily. Swallow whole, do not crush, chew, or split.  .Marland KitchenALPRAZolam (XANAX) 0.5 MG tablet Take 1 tablet (0.5 mg total) by mouth 2 (two) times daily as needed for anxiety or sleep.  .Marland KitchenamLODipine (NORVASC) 10 MG tablet Take 1 tablet (10 mg total) by mouth daily.  .Marland Kitchenapixaban (ELIQUIS) 5 MG TABS tablet Take 1 tablet (5 mg total) by mouth 2 (two) times daily. Take 10 mg bid for 7 days orally and then decrease to 5 mg bid orally thereafter.  . Cholecalciferol (VITAMIN D3) 1000 UNITS CAPS Take 1 capsule by mouth daily.   . clotrimazole-betamethasone (LOTRISONE) cream Apply 1 application topically 2 (two) times daily.  .Marland Kitchengabapentin (NEURONTIN) 100 MG capsule Take 1 capsule (100 mg total) by mouth at bedtime.  . hydrochlorothiazide  (HYDRODIURIL) 25 MG tablet Take 25 mg by mouth daily.  .Marland Kitchenlosartan (COZAAR) 100 MG tablet Take 100 mg by mouth daily.  . meclizine (ANTIVERT) 12.5 MG tablet Take 1 tablet (12.5 mg total) by mouth 2 (two) times daily as needed for dizziness.  . nystatin (MYCOSTATIN/NYSTOP) powder Apply 1 Dose topically daily.  . ondansetron (ZOFRAN) 8 MG tablet Take 1 tablet (8 mg total) by mouth every 8 (eight) hours as needed for nausea or vomiting.  . pioglitazone (ACTOS) 15 MG tablet Take 1 tablet (15 mg total) by mouth daily.  . Zoledronic Acid (ZOMETA) 4 MG/100ML IVPB Inject 4 mg into the vein every 3 (three) months.   No facility-administered encounter medications on file as of 07/30/2019.     Functional Status:  In your present state of health, do you have any difficulty performing the following activities: 07/30/2019 05/25/2019  Hearing? N N  Vision? N N  Difficulty concentrating or making decisions? N Y  Comment - patient is having difficulty writing things down so hard to remember  Walking or climbing stairs? N Y  Comment - she uses a walker. She has moved to a new apartment but hard to get things unpacked  Dressing or bathing? Y N  Comment right hand unable to use. unable to use rt arm  Doing errands, shopping? YTempie Donning Preparing Food and eating ? - Y  Comment - difficulty opening jar lids and cans  Using the Toilet? -  N  In the past six months, have you accidently leaked urine? - Y  Do you have problems with loss of bowel control? - N  Managing your Medications? - N  Managing your Finances? - N  Housekeeping or managing your Housekeeping? - Y  Some recent data might be hidden    Fall/Depression Screening: Fall Risk  05/25/2019 04/25/2019 01/25/2019  Falls in the past year? 1 1 1   Comment - - -  Number falls in past yr: 1 1 1   Injury with Fall? 0 0 0  Risk for fall due to : History of fall(s);Impaired balance/gait;Impaired mobility History of fall(s);Impaired balance/gait;Impaired mobility  History of fall(s);Impaired balance/gait;Impaired mobility  Risk for fall due to: Comment - - -  Follow up Falls evaluation completed;Education provided Falls evaluation completed;Education provided;Falls prevention discussed Falls evaluation completed;Falls prevention discussed   PHQ 2/9 Scores 05/25/2019 04/25/2019 01/25/2019 01/02/2019 12/28/2018 11/30/2018 07/21/2018  PHQ - 2 Score 1 1 1 1 2 2 2   PHQ- 9 Score - - 7 - 9 9 -    Assessment: Patient managing but needs help with meals but has not had any success in getting help with meal prep.  Patient seeing oncologist regularly and has CT scan scheduled.    Plan:  [facility] CM Care Plan Problem One     Most Recent Value  Care Plan Problem One  Knowledge Deficit in Self Management of Hypertension  Role Documenting the Problem One  Care Management Telephonic Coordinator  Care Plan for Problem One  Active  [facility] Long Term Goal   Patient will look at health maintenace and blood pressure management within the next 90 days  [facility] Long Term Goal Start Date  07/30/19  Interventions for Problem One Long Term Goal  RN CM discussed importance of taking medications a prescribed and seeing physicians regularly. Patient scheduled to see oncologist in October.  [facility] CM Short Term Goal #1   Patient will discuss with physician about palliative care and hospice within the next 30 days  [facility] CM Short Term Goal #1 Met Date  07/30/19     RN CM will refer patient to social worker to see if she qualifies for medicaid.   RN CM will contact patient in the month of November and patient agrees.    Jone Baseman, RN, MSN [facility] Care Management Coordinator Direct Line [phone] Cell [phone] Toll Free: [phone]  Fax: [phone]

## 2019-07-31 ENCOUNTER — Other Ambulatory Visit: Payer: Self-pay

## 2019-07-31 DIAGNOSIS — C50911 Malignant neoplasm of unspecified site of right female breast: Secondary | ICD-10-CM | POA: Diagnosis not present

## 2019-07-31 DIAGNOSIS — C7951 Secondary malignant neoplasm of bone: Secondary | ICD-10-CM | POA: Diagnosis not present

## 2019-07-31 DIAGNOSIS — R29898 Other symptoms and signs involving the musculoskeletal system: Secondary | ICD-10-CM | POA: Diagnosis not present

## 2019-07-31 DIAGNOSIS — R627 Adult failure to thrive: Secondary | ICD-10-CM | POA: Diagnosis not present

## 2019-07-31 NOTE — Patient Outreach (Signed)
[facility] [facility]) Care Management  07/31/2019  LYNELLE WEILER 01/05/1941 [id number]   Social work referral received from [facility], Jon Billings, to outreach patient regarding Medicaid application as patient would like to pursue personal care services. Successful outreach to patient today.  Patient aware that pcs are not covered by Medicare and she is unable to privately pay for services.  Provided patient with [place] Medicaid income limit and she stated that she believes her income is "right around that or maybe even a little less."  Will mail Medicaid application to her.  Patient stated that she does have someone that can assist with completion and gathering necessary documents to submit with application. Talked with patient about Meals on Wheels program. Patient is currently receiving five meals per week from a local church program.  Offered to submit referral to Meals on Wheels program but she declined at this time stating that she doesn't have much of an appetite and fears that food would be wasted.  Will follow up within the next two weeks to ensure receipt of Medicaid application.  Ronn Melena, BSW Social Worker [phone]

## 2019-08-02 ENCOUNTER — Telehealth: Payer: Self-pay | Admitting: Internal Medicine

## 2019-08-02 DIAGNOSIS — C50911 Malignant neoplasm of unspecified site of right female breast: Secondary | ICD-10-CM | POA: Diagnosis not present

## 2019-08-02 DIAGNOSIS — R29898 Other symptoms and signs involving the musculoskeletal system: Secondary | ICD-10-CM | POA: Diagnosis not present

## 2019-08-02 DIAGNOSIS — R627 Adult failure to thrive: Secondary | ICD-10-CM | POA: Diagnosis not present

## 2019-08-02 DIAGNOSIS — C7951 Secondary malignant neoplasm of bone: Secondary | ICD-10-CM | POA: Diagnosis not present

## 2019-08-02 NOTE — Telephone Encounter (Signed)
Copied from Page [phone]. Topic: General - Other >> Aug 02, 2019 10:00 AM Virl Axe D wrote: Reason for CRM: Radovan, OT with [facility] stated pt will be discharged today. Pt has more movement in fingers and needs to work on [employer]. He recommends outpatient therapy for right hand [phone]

## 2019-08-03 NOTE — Telephone Encounter (Signed)
fyi

## 2019-08-07 ENCOUNTER — Telehealth: Payer: Self-pay | Admitting: Internal Medicine

## 2019-08-07 NOTE — Telephone Encounter (Signed)
[facility] to [facility] on [address]. Pt needs to know what level of compression sleeves she needs for right arm

## 2019-08-07 NOTE — Telephone Encounter (Signed)
Please advise 

## 2019-08-08 NOTE — Telephone Encounter (Addendum)
15-20 mm Hg  Thx

## 2019-08-09 NOTE — Telephone Encounter (Signed)
Pt.notified

## 2019-08-10 ENCOUNTER — Encounter (HOSPITAL_COMMUNITY): Payer: Self-pay

## 2019-08-10 ENCOUNTER — Ambulatory Visit (HOSPITAL_COMMUNITY)
Admission: RE | Admit: 2019-08-10 | Discharge: 2019-08-10 | Disposition: A | Discharge status: Home / Self Care | Payer: Medicare HMO | Source: Ambulatory Visit | Attending: Hematology and Oncology | Admitting: Hematology and Oncology

## 2019-08-10 ENCOUNTER — Other Ambulatory Visit: Payer: Self-pay | Admitting: Hematology and Oncology

## 2019-08-10 ENCOUNTER — Inpatient Hospital Stay: Payer: Medicare HMO | Attending: Oncology

## 2019-08-10 ENCOUNTER — Ambulatory Visit (HOSPITAL_COMMUNITY)
Admission: RE | Admit: 2019-08-10 | Discharge: 2019-08-10 | Disposition: A | Discharge status: Home / Self Care | Payer: Medicare HMO | Source: Ambulatory Visit

## 2019-08-10 ENCOUNTER — Other Ambulatory Visit: Payer: Self-pay

## 2019-08-10 ENCOUNTER — Telehealth: Payer: Self-pay | Admitting: *Deleted

## 2019-08-10 DIAGNOSIS — R630 Anorexia: Secondary | ICD-10-CM | POA: Diagnosis not present

## 2019-08-10 DIAGNOSIS — C7951 Secondary malignant neoplasm of bone: Secondary | ICD-10-CM

## 2019-08-10 DIAGNOSIS — R918 Other nonspecific abnormal finding of lung field: Secondary | ICD-10-CM | POA: Diagnosis not present

## 2019-08-10 DIAGNOSIS — Z923 Personal history of irradiation: Secondary | ICD-10-CM | POA: Diagnosis not present

## 2019-08-10 DIAGNOSIS — Z170 Estrogen receptor positive status [ER+]: Secondary | ICD-10-CM | POA: Diagnosis not present

## 2019-08-10 DIAGNOSIS — Z79899 Other long term (current) drug therapy: Secondary | ICD-10-CM | POA: Insufficient documentation

## 2019-08-10 DIAGNOSIS — C7800 Secondary malignant neoplasm of unspecified lung: Secondary | ICD-10-CM | POA: Insufficient documentation

## 2019-08-10 DIAGNOSIS — C787 Secondary malignant neoplasm of liver and intrahepatic bile duct: Secondary | ICD-10-CM | POA: Insufficient documentation

## 2019-08-10 DIAGNOSIS — C50911 Malignant neoplasm of unspecified site of right female breast: Secondary | ICD-10-CM | POA: Diagnosis not present

## 2019-08-10 DIAGNOSIS — Z853 Personal history of malignant neoplasm of breast: Secondary | ICD-10-CM | POA: Diagnosis not present

## 2019-08-10 DIAGNOSIS — Z7901 Long term (current) use of anticoagulants: Secondary | ICD-10-CM | POA: Insufficient documentation

## 2019-08-10 DIAGNOSIS — Z79811 Long term (current) use of aromatase inhibitors: Secondary | ICD-10-CM | POA: Diagnosis not present

## 2019-08-10 LAB — CMP (CANCER CENTER ONLY)
ALT: 14 U/L (ref 0–44)
AST: 47 U/L — ABNORMAL HIGH (ref 15–41)
Albumin: 3.2 g/dL — ABNORMAL LOW (ref 3.5–5.0)
Alkaline Phosphatase: 126 U/L (ref 38–126)
Anion gap: 14 (ref 5–15)
BUN: 24 mg/dL — ABNORMAL HIGH (ref 8–23)
CO2: 19 mmol/L — ABNORMAL LOW (ref 22–32)
Calcium: 9.3 mg/dL (ref 8.9–10.3)
Chloride: 107 mmol/L (ref 98–111)
Creatinine: 2.41 mg/dL — ABNORMAL HIGH (ref 0.44–1.00)
GFR, Est AFR Am: 22 mL/min — ABNORMAL LOW (ref >60)
GFR, Estimated: 19 mL/min — ABNORMAL LOW (ref >60)
Glucose, Bld: 98 mg/dL (ref 70–99)
Potassium: 4.4 mmol/L (ref 3.5–5.1)
Sodium: 140 mmol/L (ref 135–145)
Total Bilirubin: 0.4 mg/dL (ref 0.3–1.2)
Total Protein: 7.5 g/dL (ref 6.5–8.1)

## 2019-08-10 LAB — CBC WITH DIFFERENTIAL (CANCER CENTER ONLY)
Abs Immature Granulocytes: 0.06 10*3/uL (ref 0.00–0.07)
Basophils Absolute: 0.1 10*3/uL (ref 0.0–0.1)
Basophils Relative: 1 %
Eosinophils Absolute: 0.1 10*3/uL (ref 0.0–0.5)
Eosinophils Relative: 1 %
HCT: 34.7 % — ABNORMAL LOW (ref 36.0–46.0)
Hemoglobin: 10.9 g/dL — ABNORMAL LOW (ref 12.0–15.0)
Immature Granulocytes: 1 %
Lymphocytes Relative: 14 %
Lymphs Abs: 1.2 10*3/uL (ref 0.7–4.0)
MCH: 29.8 pg (ref 26.0–34.0)
MCHC: 31.4 g/dL (ref 30.0–36.0)
MCV: 94.8 fL (ref 80.0–100.0)
Monocytes Absolute: 0.9 10*3/uL (ref 0.1–1.0)
Monocytes Relative: 10 %
Neutro Abs: 6.2 10*3/uL (ref 1.7–7.7)
Neutrophils Relative %: 73 %
Platelet Count: 283 10*3/uL (ref 150–400)
RBC: 3.66 MIL/uL — ABNORMAL LOW (ref 3.87–5.11)
RDW: 15.4 % (ref 11.5–15.5)
WBC Count: 8.5 10*3/uL (ref 4.0–10.5)
nRBC: 0 % (ref 0.0–0.2)

## 2019-08-10 LAB — HEMOGLOBIN A1C
Hgb A1c MFr Bld: 5.8 % — ABNORMAL HIGH (ref 4.8–5.6)
Mean Plasma Glucose: 119.76 mg/dL

## 2019-08-10 NOTE — Telephone Encounter (Signed)
FYI  "[name] [name] Long CT needing change of orders.  Per radiology protocol, contrast cannot be given.  Creatinine = 2.41, African American with 22-19 GFR.  Need CT of Chest, Abd, Pelvis without contrast ordered for today."  Order entered as requested per Radiology CT scanning protocol.

## 2019-08-13 ENCOUNTER — Other Ambulatory Visit: Payer: Medicare HMO

## 2019-08-13 ENCOUNTER — Inpatient Hospital Stay: Payer: Medicare HMO | Admitting: Hematology and Oncology

## 2019-08-13 ENCOUNTER — Telehealth: Payer: Self-pay | Admitting: Hematology and Oncology

## 2019-08-13 ENCOUNTER — Inpatient Hospital Stay: Payer: Medicare HMO

## 2019-08-13 NOTE — Telephone Encounter (Signed)
Returned patient's phone call regarding rescheduling an appointment, per patient's request 10/12 missed appointment has moved to 10/15.

## 2019-08-13 NOTE — Assessment & Plan Note (Deleted)
Metastatic breast cancer with bone metastases along with lung and liver metastases, progressed on Ibrance with anastrozoleas well as Faslodex with Ibrance8/29/19- 04/23/19.  Radiology review: 05/23/2018 CT CAP:New liver metastases 3.1 cm,increase in lung nodules, increase in sclerotic lesion of T11 and T9and S1 vertebral body. Liver biopsy 06/19/2018: Metastatic breast cancer ER 100%, PR 0%, Ki-67 50%, HER-2 negative BRCA1 mutation: Negative  PDL 1: 0%and foundation 1:PI 3 kinase mutation was identified ----------------------------------------------------------------------------------------------------------------------------------------------------------------------- Current treatment:Faslodex with Alpelisib started 04/23/2019 Patient's insurance rejected Xgeva.OnZometa every 3 months.   Palliative radiation: 01/25/2019-02/07/2019  PET CT scan 04/20/2019: Large hypermetabolic mass in the right hepatic lobe increase in size from 3 cm to 13.7 x 10.1 cm with SUV of 14.5, diffuse hypermetabolic bone metastases also increased, left adrenal gland metastatic lesion, increase in size of bilateral lung nodules  Alpelisib toxicities: 1.Diarrheaalternating with constipationcausing rectal discomfort: Much improved 2.Fatigue 3.  Rawness in the mouth: Encouraged to use salt water gargles.  We are watching her blood sugars extremely closely.  CT chest abdomen pelvis 08/10/2019: Interval worsening of the disease with enlarging liver metastatic lesions 15 cm compared to 11 cm, new left lobe lesion 2.3 cm, new 1.6 cm left liver lesion.  Right axillary mass 3.5 cm, multiple lung nodules increased in size, bone metastases same as before left adrenal met same as before  Radiology review: Based upon the above findings patient clearly has progression of disease and has failed Alpelisib therapy.  Treatment plan: Palliative chemotherapy with Xeloda Xeloda counseling: Discussed risks and benefits of  Xeloda including the risk of hand-foot syndrome, liver toxicity, fatigue, GI side effects and especially diarrhea and blood count changes.  Patient understands these risks and is willing to proceed.  Discussed with the patient the other option would be exemestane with everolimus.  I discussed the toxicities of everolimus including mucositis blood sugar and cholesterol changes as well as fatigue.

## 2019-08-14 ENCOUNTER — Other Ambulatory Visit: Payer: Self-pay

## 2019-08-14 DIAGNOSIS — R29898 Other symptoms and signs involving the musculoskeletal system: Secondary | ICD-10-CM | POA: Diagnosis not present

## 2019-08-14 DIAGNOSIS — R627 Adult failure to thrive: Secondary | ICD-10-CM | POA: Diagnosis not present

## 2019-08-14 DIAGNOSIS — C50911 Malignant neoplasm of unspecified site of right female breast: Secondary | ICD-10-CM | POA: Diagnosis not present

## 2019-08-14 DIAGNOSIS — C7951 Secondary malignant neoplasm of bone: Secondary | ICD-10-CM | POA: Diagnosis not present

## 2019-08-14 NOTE — Patient Outreach (Signed)
[facility]) Care Management  08/14/2019  [name] [name] July 15, 1941 [id number]   Successful follow up call to patient today.  Reminded patient about our last conversation in which she expressed interest in applying for Medicaid as she desires to have personal care services.  Reminded patient that these services are not covered by Medicare.  Patient stated that mail has not been checked since last week so she is unsure if application was received.  Patient stated that she will have a friend assist with checking mail.  She requested another follow up call on Friday. Will call her back at that time and mail another application if not received.  Ronn Melena, BSW Social Worker [phone]

## 2019-08-16 ENCOUNTER — Telehealth: Payer: Self-pay | Admitting: Hematology and Oncology

## 2019-08-16 ENCOUNTER — Inpatient Hospital Stay: Payer: Medicare HMO

## 2019-08-16 ENCOUNTER — Inpatient Hospital Stay: Payer: Medicare HMO | Admitting: Hematology and Oncology

## 2019-08-16 NOTE — Assessment & Plan Note (Deleted)
Metastatic breast cancer with bone metastases along with lung and liver metastases, progressed on Ibrance with anastrozoleas well as Faslodex with Ibrance8/29/19- 04/23/19.  Radiology review: 05/23/2018 CT CAP:New liver metastases 3.1 cm,increase in lung nodules, increase in sclerotic lesion of T11 and T9and S1 vertebral body. Liver biopsy 06/19/2018: Metastatic breast cancer ER 100%, PR 0%, Ki-67 50%, HER-2 negative BRCA1 mutation: Negative  PDL 1: 0%and foundation 1:PI 3 kinase mutation was identified ----------------------------------------------------------------------------------------------------------------------------------------------------------------------- Current treatment:Faslodex with Alpelisib started 04/23/2019-08/16/2019 discontinued for progression Patient's insurance rejected Xgeva.OnZometa every 3 months.   Palliative radiation: 01/25/2019-02/07/2019  PET CT scan 04/20/2019: Large hypermetabolic mass in the right hepatic lobe increase in size from 3 cm to 13.7 x 10.1 cm with SUV of 14.5, diffuse hypermetabolic bone metastases also increased, left adrenal gland metastatic lesion, increase in size of bilateral lung nodules  Alpelisib toxicities: 1.Diarrheaalternating with constipationcausing rectal discomfort: Much improved 2.Fatigue 3.  Rawness in the mouth: Encouraged to use salt water gargles.  We are watching her blood sugars extremely closely.  CT chest abdomen pelvis 08/10/2019: Progression of disease.  Multiple lung nodules increase in size 1.1 cm to 1.4 cm, 1 cm to 1.2 cm, 4 x 1.4 cm unchanged, large right liver mass 15 x 10 cm previously was 11 x 8 cm, new left lobe lesion 2.3 cm, 1.6 cm left lesion, left adrenal mass 2.6 cm  Radiology review: I discussed the scan results with the patient and it showed progression of disease.  Recommendation: Based upon the tumor mutational burden of 15, I recommended immunotherapy with pembrolizumab.  We discussed  risks and benefits of immunotherapy including the risk of immunological adverse effects on endocrine organs like thyroid and pituitary as well as skin and GI tract and liver.  She understands these risks and is willing to proceed.  Plan to start treatment in 2 weeks.

## 2019-08-16 NOTE — Telephone Encounter (Signed)
ok 

## 2019-08-16 NOTE — Telephone Encounter (Signed)
Returned patient's phone call regarding rescheduling 10/15 cancelled appointment, per patient's request appointment has moved to 10/26.   Message to provider.

## 2019-08-16 NOTE — Telephone Encounter (Signed)
Returned patient's phone call regarding rescheduling an appointment, left a voicemail. 

## 2019-08-17 ENCOUNTER — Other Ambulatory Visit: Payer: Self-pay

## 2019-08-17 NOTE — Patient Outreach (Signed)
[facility]) Care Management  08/17/2019  SHARLYNN SECKINGER [birth year] [id number]   Successful follow up call to patient today, however, she has not received Medicaid application that was mailed on 07/31/19.  Resending today and will follow up within the next two weeks to ensure receipt.  Ronn Melena, BSW Social Worker [phone]

## 2019-08-26 NOTE — Progress Notes (Signed)
Patient Care Team: [name], [name], MD as PCP - General (Internal Medicine) Curt Bears, MD (Hematology and Oncology) Paralee Cancel, MD (Orthopedic Surgery) Tyler Pita, MD (Radiation Oncology) Nicholas Lose, MD as Consulting Physician (Hematology and Oncology) Jon Billings, RN as [place]/gallery conservator as [place] [employer]  DIAGNOSIS:    ICD-10-CM   1. Breast cancer metastasized to bone, right ([place])  C50.911    C79.51     SUMMARY OF ONCOLOGIC HISTORY: Oncology History  Breast cancer metastasized to bone, right ([place])  05/22/2001 Initial Diagnosis   Rt breast: stage IIb (T2, N1, M0) invasive right breast carcinoma with positive estrogen and progesterone receptor as well as HER-2/neu   06/12/2001 Surgery   right lumpectomy with right axillary lymph node dissection revealing 4/9 lymph nodes that were positive for malignancy.    08/12/2001 - 01/10/2002 Chemotherapy   Adj chemo CMF X 8    05/07/2002 - 12/14/2007 Anti-estrogen oral therapy   Adj Tamoxifen for 2 years and then switched to Letrozole (by Dr.Khan in Aug 2005), stopped Feb 2009   02/11/2003 - 04/05/2003 Radiation Therapy   Adj XRT by Tammi Klippel   03/18/2011 -  Anti-estrogen oral therapy   Anastrozole 1 mg daily   04/01/2011 Surgery   Left total hip replacement secondary to pathologic left femoral neck fracture   04/25/2017 Relapse/Recurrence   Anastrozole with Ibrance discontinued 05/30/2018 due to disease progression, Xgeva for bone metastases every 3 months   06/19/2018 Procedure   Liver biopsy: Metastatic breast cancer, ER 100%, PR 0%, Ki-67 50%, HER-2 negative   07/06/2018 Miscellaneous   Foundation 1 results: Microsatellite stable, tumor mutational burden 15 mutations per MB, ATRX, CCND1, CDK4 amplification, ERBB4 amplification, ESR1 amplification, FGFR 1, MDM2 amplification, MEM1, NST3 amplification, PIK3CA mutation, ZNF703 amplification; PDL 1: 0%  07/06/2018 -  Anti-estrogen oral therapy   Ibrance with Faslodex   01/26/2019 - 02/07/2019 Radiation Therapy   Palliative radiation to the neck and subclavian area   05/01/2019 Treatment Plan Change   Alpelisib 24m daily     CHIEF COMPLIANT: Follow-up of metastatic breast canceron Alpelisib to review recent scans  INTERVAL HISTORY: SBREEZE BERRINGERis a 78y.o. with above-mentioned history of metastatic breast cancerwith metastases to the liver, lung, and boneswhois currently ontreatment with Alpelisib. CT CAP on 08/10/19 showed interval worsening of disease with new and enlarging hepatic metastases, slight enlargement of bilateral pulmonary masses, stable osseous metastases, and a stable left adrenal nodule. She presents to the clinic todayfor a toxicity check and to review recent scans.  Her biggest complaint today is extreme lack of appetite.  She has not been able to eat or drink a whole lot because of this.  REVIEW OF SYSTEMS:   Constitutional: Generalized fatigue and weakness. Eyes: Denies blurriness of vision Ears, nose, mouth, throat, and face: Denies mucositis or sore throat Respiratory: Denies cough, dyspnea or wheezes Cardiovascular: Denies palpitation, chest discomfort Gastrointestinal: Severe lack of appetite. Skin: Denies abnormal skin rashes Lymphatics: Denies new lymphadenopathy or easy bruising Neurological: Denies numbness, tingling or new weaknesses Behavioral/Psych: Mood is stable, no new changes  Extremities: No lower extremity edema Breast: denies any pain or lumps or nodules in either breasts All other systems were reviewed with the patient and are negative.  I have reviewed the past medical history, past surgical history, social history and family history with the patient and they are unchanged from previous note.  ALLERGIES:  has No Known Allergies.  MEDICATIONS:  Current Outpatient Medications  Medication Sig Dispense Refill  . acetaminophen (TYLENOL)  500 MG tablet Take 500 mg by mouth daily as needed (arm pain).    Marland Kitchen alpelisib (PIQRAY 200MG DAILY DOSE) 200 MG Therapy Pack Take one 263m tablet with food at the same time daily. Swallow whole, do not crush, chew, or split. 30 each 3  . ALPRAZolam (XANAX) 0.5 MG tablet Take 1 tablet (0.5 mg total) by mouth 2 (two) times daily as needed for anxiety or sleep. 60 tablet 3  . amLODipine (NORVASC) 10 MG tablet Take 1 tablet (10 mg total) by mouth daily. 90 tablet 3  . apixaban (ELIQUIS) 5 MG TABS tablet Take 1 tablet (5 mg total) by mouth 2 (two) times daily. Take 10 mg bid for 7 days orally and then decrease to 5 mg bid orally thereafter. 60 tablet 5  . Cholecalciferol (VITAMIN D3) 1000 UNITS CAPS Take 1 capsule by mouth daily.     . clotrimazole-betamethasone (LOTRISONE) cream Apply 1 application topically 2 (two) times daily. 90 g 1  . gabapentin (NEURONTIN) 100 MG capsule Take 1 capsule (100 mg total) by mouth at bedtime. 30 capsule 5  . hydrochlorothiazide (HYDRODIURIL) 25 MG tablet Take 25 mg by mouth daily.  3  . losartan (COZAAR) 100 MG tablet Take 100 mg by mouth daily.  3  . meclizine (ANTIVERT) 12.5 MG tablet Take 1 tablet (12.5 mg total) by mouth 2 (two) times daily as needed for dizziness. 10 tablet 0  . nystatin (MYCOSTATIN/NYSTOP) powder Apply 1 Dose topically daily.  3  . ondansetron (ZOFRAN) 8 MG tablet Take 1 tablet (8 mg total) by mouth every 8 (eight) hours as needed for nausea or vomiting. 30 tablet 0  . pioglitazone (ACTOS) 15 MG tablet Take 1 tablet (15 mg total) by mouth daily. 30 tablet 3  . Zoledronic Acid (ZOMETA) 4 MG/100ML IVPB Inject 4 mg into the vein every 3 (three) months.     No current facility-administered medications for this visit.     PHYSICAL EXAMINATION: ECOG PERFORMANCE STATUS: 3 - Symptomatic, >50% confined to bed  Vitals:   08/27/19 1539  BP: 106/72  Pulse: (!) 105  Resp: 17  Temp: 98 F (36.7 C)  SpO2: 99%   Filed Weights   08/27/19 1539   Weight: 159 lb 11.2 oz (72.4 kg)    GENERAL: alert, no distress and comfortable SKIN: skin color, texture, turgor are normal, no rashes or significant lesions EYES: normal, Conjunctiva are pink and non-injected, sclera clear OROPHARYNX: no exudate, no erythema and lips, buccal mucosa, and tongue normal  NECK: supple, thyroid normal size, non-tender, without nodularity LYMPH: no palpable lymphadenopathy in the cervical, axillary or inguinal LUNGS: clear to auscultation and percussion with normal breathing effort HEART: regular rate & rhythm and no murmurs and no lower extremity edema ABDOMEN: abdomen soft, non-tender and normal bowel sounds MUSCULOSKELETAL: no cyanosis of digits and no clubbing  NEURO: alert & oriented x 3 with fluent speech, no focal motor/sensory deficits EXTREMITIES: No lower extremity edema  LABORATORY DATA:  I have reviewed the data as listed CMP Latest Ref Rng & Units 08/27/2019 08/10/2019 07/11/2019  Glucose 70 - 99 mg/dL 94 98 117(H)  BUN 8 - 23 mg/dL 21 24(H) 28(H)  Creatinine 0.44 - 1.00 mg/dL 2.47(H) 2.41(H) 2.41(H)  Sodium 135 - 145 mmol/L 138 140 138  Potassium 3.5 - 5.1 mmol/L 3.8 4.4 4.1  Chloride 98 - 111 mmol/L 105 107 105  CO2 22 -  32 mmol/L 18(L) 19(L) 19(L)  Calcium 8.9 - 10.3 mg/dL 9.7 9.3 9.1  Total Protein 6.5 - 8.1 g/dL 8.0 7.5 7.9  Total Bilirubin 0.3 - 1.2 mg/dL 0.6 0.4 0.3  Alkaline Phos 38 - 126 U/L 147(H) 126 119  AST 15 - 41 U/L 58(H) 47(H) 42(H)  ALT 0 - 44 U/L 16 14 17     Lab Results  Component Value Date   WBC 8.7 08/27/2019   HGB 11.3 (L) 08/27/2019   HCT 36.5 08/27/2019   MCV 93.1 08/27/2019   PLT 306 08/27/2019   NEUTROABS 6.5 08/27/2019    ASSESSMENT & PLAN:  Breast cancer metastasized to bone, right ([facility]) Metastatic breast cancer with bone metastases along with lung and liver metastases, progressed on Ibrance with anastrozoleas well as Faslodex with Ibrance8/29/19- 04/23/19.  Radiology review: 05/23/2018 CT CAP:New  liver metastases 3.1 cm,increase in lung nodules, increase in sclerotic lesion of T11 and T9and S1 vertebral body. Liver biopsy 06/19/2018: Metastatic breast cancer ER 100%, PR 0%, Ki-67 50%, HER-2 negative BRCA1 mutation: Negative  PDL 1: 0%and foundation 1:PI 3 kinase mutation was identified ----------------------------------------------------------------------------------------------------------------------------------------------------------------------- Current treatment:Faslodex with Alpelisib started 04/23/2019-08/16/2019 discontinued for progression Patient's insurance rejected Xgeva.OnZometa every 3 months.   Palliative radiation: 01/25/2019-02/07/2019  PET CT scan 04/20/2019: Large hypermetabolic mass in the right hepatic lobe increase in size from 3 cm to 13.7 x 10.1 cm with SUV of 14.5, diffuse hypermetabolic bone metastases also increased, left adrenal gland metastatic lesion, increase in size of bilateral lung nodules  Alpelisib toxicities: 1.Diarrheaalternating with constipationcausing rectal discomfort: Much improved 2.Fatigue 3.  Rawness in the mouth: Encouraged to use salt water gargles.  CT chest abdomen pelvis 08/10/2019: Progression of disease.  Multiple lung nodules increase in size 1.1 cm to 1.4 cm, 1 cm to 1.2 cm, 4 x 1.4 cm unchanged, large right liver mass 15 x 10 cm previously was 11 x 8 cm, new left lobe lesion 2.3 cm, 1.6 cm left lesion, left adrenal mass 2.6 cm  Radiology review: I discussed the scan results with the patient and it showed progression of disease.  Recommendation: Based upon the tumor mutational burden of 15, I recommended immunotherapy with pembrolizumab.  We discussed risks and benefits of immunotherapy including the risk of immunological adverse effects on endocrine organs like thyroid and pituitary as well as skin and GI tract and liver.  She understands these risks and is willing to proceed.  Goals of care discussion: I discussed  with the patient that she likely has less than 6 months to live and that if the next treatment does not work then we will have to consider hospice.  Plan to start treatment in 1 weeks.      No orders of the defined types were placed in this encounter.  The patient has a good understanding of the overall plan. she agrees with it. she will call with any problems that may develop before the next visit here.  Nicholas Lose, MD 08/27/2019  Julious Oka Dorshimer am acting as scribe for Dr. Nicholas Lose.  I have reviewed the above documentation for accuracy and completeness, and I agree with the above.

## 2019-08-27 ENCOUNTER — Inpatient Hospital Stay: Payer: Medicare HMO | Admitting: Hematology and Oncology

## 2019-08-27 ENCOUNTER — Other Ambulatory Visit: Payer: Self-pay

## 2019-08-27 ENCOUNTER — Inpatient Hospital Stay: Payer: Medicare HMO

## 2019-08-27 VITALS — BP 106/72 | HR 105 | Temp 98.0°F | Resp 17 | Ht 63.0 in | Wt 159.7 lb

## 2019-08-27 DIAGNOSIS — C50911 Malignant neoplasm of unspecified site of right female breast: Secondary | ICD-10-CM

## 2019-08-27 DIAGNOSIS — Z7189 Other specified counseling: Secondary | ICD-10-CM

## 2019-08-27 DIAGNOSIS — C7800 Secondary malignant neoplasm of unspecified lung: Secondary | ICD-10-CM | POA: Diagnosis not present

## 2019-08-27 DIAGNOSIS — C7951 Secondary malignant neoplasm of bone: Secondary | ICD-10-CM

## 2019-08-27 DIAGNOSIS — Z79899 Other long term (current) drug therapy: Secondary | ICD-10-CM | POA: Diagnosis not present

## 2019-08-27 DIAGNOSIS — C787 Secondary malignant neoplasm of liver and intrahepatic bile duct: Secondary | ICD-10-CM | POA: Diagnosis not present

## 2019-08-27 DIAGNOSIS — R630 Anorexia: Secondary | ICD-10-CM | POA: Diagnosis not present

## 2019-08-27 DIAGNOSIS — Z79811 Long term (current) use of aromatase inhibitors: Secondary | ICD-10-CM | POA: Diagnosis not present

## 2019-08-27 DIAGNOSIS — Z170 Estrogen receptor positive status [ER+]: Secondary | ICD-10-CM | POA: Diagnosis not present

## 2019-08-27 DIAGNOSIS — Z923 Personal history of irradiation: Secondary | ICD-10-CM | POA: Diagnosis not present

## 2019-08-27 DIAGNOSIS — R918 Other nonspecific abnormal finding of lung field: Secondary | ICD-10-CM | POA: Diagnosis not present

## 2019-08-27 LAB — CBC WITH DIFFERENTIAL (CANCER CENTER ONLY)
Abs Immature Granulocytes: 0.05 10*3/uL (ref 0.00–0.07)
Basophils Absolute: 0.1 10*3/uL (ref 0.0–0.1)
Basophils Relative: 1 %
Eosinophils Absolute: 0.1 10*3/uL (ref 0.0–0.5)
Eosinophils Relative: 1 %
HCT: 36.5 % (ref 36.0–46.0)
Hemoglobin: 11.3 g/dL — ABNORMAL LOW (ref 12.0–15.0)
Immature Granulocytes: 1 %
Lymphocytes Relative: 14 %
Lymphs Abs: 1.2 10*3/uL (ref 0.7–4.0)
MCH: 28.8 pg (ref 26.0–34.0)
MCHC: 31 g/dL (ref 30.0–36.0)
MCV: 93.1 fL (ref 80.0–100.0)
Monocytes Absolute: 0.8 10*3/uL (ref 0.1–1.0)
Monocytes Relative: 9 %
Neutro Abs: 6.5 10*3/uL (ref 1.7–7.7)
Neutrophils Relative %: 74 %
Platelet Count: 306 10*3/uL (ref 150–400)
RBC: 3.92 MIL/uL (ref 3.87–5.11)
RDW: 16.2 % — ABNORMAL HIGH (ref 11.5–15.5)
WBC Count: 8.7 10*3/uL (ref 4.0–10.5)
nRBC: 0 % (ref 0.0–0.2)

## 2019-08-27 LAB — CMP (CANCER CENTER ONLY)
ALT: 16 U/L (ref 0–44)
AST: 58 U/L — ABNORMAL HIGH (ref 15–41)
Albumin: 3.2 g/dL — ABNORMAL LOW (ref 3.5–5.0)
Alkaline Phosphatase: 147 U/L — ABNORMAL HIGH (ref 38–126)
Anion gap: 15 (ref 5–15)
BUN: 21 mg/dL (ref 8–23)
CO2: 18 mmol/L — ABNORMAL LOW (ref 22–32)
Calcium: 9.7 mg/dL (ref 8.9–10.3)
Chloride: 105 mmol/L (ref 98–111)
Creatinine: 2.47 mg/dL — ABNORMAL HIGH (ref 0.44–1.00)
GFR, Est AFR Am: 21 mL/min — ABNORMAL LOW (ref >60)
GFR, Estimated: 18 mL/min — ABNORMAL LOW (ref >60)
Glucose, Bld: 94 mg/dL (ref 70–99)
Potassium: 3.8 mmol/L (ref 3.5–5.1)
Sodium: 138 mmol/L (ref 135–145)
Total Bilirubin: 0.6 mg/dL (ref 0.3–1.2)
Total Protein: 8 g/dL (ref 6.5–8.1)

## 2019-08-27 LAB — LIPID PANEL
Cholesterol: 179 mg/dL (ref 0–200)
HDL: 48 mg/dL (ref >40)
LDL Cholesterol: 107 mg/dL — ABNORMAL HIGH (ref 0–99)
Total CHOL/HDL Ratio: 3.7 RATIO
Triglycerides: 118 mg/dL (ref <150)
VLDL: 24 mg/dL (ref 0–40)

## 2019-08-27 MED ORDER — LIDOCAINE-PRILOCAINE 2.5-2.5 % EX CREA: TOPICAL_CREAM | CUTANEOUS | 3 refills | Status: AC

## 2019-08-27 MED ORDER — DRONABINOL 2.5 MG PO CAPS: 2.5000 mg | ORAL_CAPSULE | Freq: Two times a day (BID) | ORAL | 0 refills | Status: AC

## 2019-08-27 MED ORDER — PROCHLORPERAZINE MALEATE 10 MG PO TABS: 10.0000 mg | ORAL_TABLET | Freq: Four times a day (QID) | ORAL | 1 refills | Status: AC | PRN

## 2019-08-27 MED FILL — PROCHLORPERAZINE 10 MG TAB: 10 | 7 days supply | Qty: 30 | Fill #0

## 2019-08-27 MED FILL — LIDOCAINE-PRILOCAINE CREAM: 2.5-2.5 | 30 days supply | Qty: 30 | Fill #0

## 2019-08-27 MED FILL — DRONABINOL 2.5 MG CAPSULE: 2.5 | 30 days supply | Qty: 60 | Fill #0

## 2019-08-27 NOTE — Progress Notes (Signed)
START OFF PATHWAY REGIMEN - Breast   [id number]:Pembrolizumab 2 mg/kg q21 Days:   A cycle is every 21 days:     Pembrolizumab   **Always confirm dose/schedule in your pharmacy ordering system**  Patient Characteristics: Distant Metastases or Locoregional Recurrent Disease - Unresected or Locally Advanced Unresectable Disease Progressing after Neoadjuvant and Local Therapies, HER2 Negative/Unknown/Equivocal, ER Positive, Chemotherapy, Third Line and Beyond, Prior or  Contraindicated Anthracycline and Prior Eribulin Therapeutic Status: Distant Metastases BRCA Mutation Status: Absent ER Status: Positive (+) HER2 Status: Negative (-) PR Status: Positive (+) Line of Therapy: Third Line and Beyond Intent of Therapy: Non-Curative / Palliative Intent, Discussed with Patient

## 2019-08-27 NOTE — Assessment & Plan Note (Signed)
Metastatic breast cancer with bone metastases along with lung and liver metastases, progressed on Ibrance with anastrozoleas well as Faslodex with Ibrance8/29/19- 04/23/19.  Radiology review: 05/23/2018 CT CAP:New liver metastases 3.1 cm,increase in lung nodules, increase in sclerotic lesion of T11 and T9and S1 vertebral body. Liver biopsy 06/19/2018: Metastatic breast cancer ER 100%, PR 0%, Ki-67 50%, HER-2 negative BRCA1 mutation: Negative  PDL 1: 0%and foundation 1:PI 3 kinase mutation was identified ----------------------------------------------------------------------------------------------------------------------------------------------------------------------- Current treatment:Faslodex with Alpelisib started 04/23/2019-08/16/2019 discontinued for progression Patient's insurance rejected Xgeva.OnZometa every 3 months.   Palliative radiation: 01/25/2019-02/07/2019  PET CT scan 04/20/2019: Large hypermetabolic mass in the right hepatic lobe increase in size from 3 cm to 13.7 x 10.1 cm with SUV of 14.5, diffuse hypermetabolic bone metastases also increased, left adrenal gland metastatic lesion, increase in size of bilateral lung nodules  Alpelisib toxicities: 1.Diarrheaalternating with constipationcausing rectal discomfort: Much improved 2.Fatigue 3.  Rawness in the mouth: Encouraged to use salt water gargles.  CT chest abdomen pelvis 08/10/2019: Progression of disease.  Multiple lung nodules increase in size 1.1 cm to 1.4 cm, 1 cm to 1.2 cm, 4 x 1.4 cm unchanged, large right liver mass 15 x 10 cm previously was 11 x 8 cm, new left lobe lesion 2.3 cm, 1.6 cm left lesion, left adrenal mass 2.6 cm  Radiology review: I discussed the scan results with the patient and it showed progression of disease.  Recommendation: Based upon the tumor mutational burden of 15, I recommended immunotherapy with pembrolizumab.  We discussed risks and benefits of immunotherapy including the risk  of immunological adverse effects on endocrine organs like thyroid and pituitary as well as skin and GI tract and liver.  She understands these risks and is willing to proceed.  Plan to start treatment in 2 weeks.

## 2019-08-28 ENCOUNTER — Telehealth: Payer: Self-pay | Admitting: Hematology and Oncology

## 2019-08-28 NOTE — Telephone Encounter (Signed)
I talk with patient regarding schedule  

## 2019-08-31 ENCOUNTER — Other Ambulatory Visit: Payer: Self-pay

## 2019-08-31 NOTE — Patient Outreach (Signed)
[facility]) Care Management  08/31/2019  [name] [name] 1941/09/18 [id number]   Successful follow up call to patient today.  Patient completed Medicaid application and mailed it to [facility] office.  Informed her today that it was received but needed to be sent to [place].  [facility] Administrative Assistant mailed it to [facility] on 08/27/19.  Informed patient that DSS has up to 90 days to process application .  Encouraged her to call to check on status of application within the next few weeks.  Closing Social Work case but did encourage her to call if additional needs arise.    Ronn Melena, BSW Social Worker [phone]

## 2019-09-03 NOTE — Progress Notes (Signed)
Patient Care Team: [name], [name], MD as PCP - General (Internal Medicine) Curt Bears, MD (Hematology and Oncology) Paralee Cancel, MD (Orthopedic Surgery) Tyler Pita, MD (Radiation Oncology) Nicholas Lose, MD as Consulting Physician (Hematology and Oncology) Jon Billings, RN as [facility]  DIAGNOSIS:    ICD-10-CM   1. Breast cancer metastasized to bone, right ([place])  C50.911    C79.51     SUMMARY OF ONCOLOGIC HISTORY: Oncology History  Breast cancer metastasized to bone, right ([place])  05/22/2001 Initial Diagnosis   Rt breast: stage IIb (T2, N1, M0) invasive right breast carcinoma with positive estrogen and progesterone receptor as well as HER-2/neu   06/12/2001 Surgery   right lumpectomy with right axillary lymph node dissection revealing 4/9 lymph nodes that were positive for malignancy.    08/12/2001 - 01/10/2002 Chemotherapy   Adj chemo CMF X 8    05/07/2002 - 12/14/2007 Anti-estrogen oral therapy   Adj Tamoxifen for 2 years and then switched to Letrozole (by Dr.Khan in Aug 2005), stopped Feb 2009   02/11/2003 - 04/05/2003 Radiation Therapy   Adj XRT by Tammi Klippel   03/18/2011 -  Anti-estrogen oral therapy   Anastrozole 1 mg daily   04/01/2011 Surgery   Left total hip replacement secondary to pathologic left femoral neck fracture   04/25/2017 Relapse/Recurrence   Anastrozole with Ibrance discontinued 05/30/2018 due to disease progression, Xgeva for bone metastases every 3 months   06/19/2018 Procedure   Liver biopsy: Metastatic breast cancer, ER 100%, PR 0%, Ki-67 50%, HER-2 negative   07/06/2018 Miscellaneous   Foundation 1 results: Microsatellite stable, tumor mutational burden 15 mutations per MB, ATRX, CCND1, CDK4 amplification, ERBB4 amplification, ESR1 amplification, FGFR 1, MDM2 amplification, MEM1, NST3 amplification, PIK3CA mutation, ZNF703 amplification; PDL 1: 0%   07/06/2018 -  Anti-estrogen oral therapy   Ibrance with  Faslodex   01/26/2019 - 02/07/2019 Radiation Therapy   Palliative radiation to the neck and subclavian area   05/01/2019 Treatment Plan Change   Alpelisib 275m daily   09/04/2019 -  Chemotherapy   The patient had pembrolizumab (KEYTRUDA) 200 mg in sodium chloride 0.9 % 50 mL chemo infusion, 200 mg (100 % of original dose 200 mg), Intravenous, Once, 0 of 6 cycles Dose modification: 200 mg (original dose 200 mg, Cycle 1, Reason: Provider Judgment)  for chemotherapy treatment.      CHIEF COMPLIANT: Follow-up of metastatic breast canceron Keytruda  INTERVAL HISTORY: [name] FRANKIEis a 78y.o. with above-mentioned history of metastatic breast cancerwith metastases to the liver, lung, and boneswhois currently ontreatment with Keytruda. She presents to the clinic today for treatment.   REVIEW OF SYSTEMS:   Constitutional: Denies fevers, chills or abnormal weight loss Eyes: Denies blurriness of vision Ears, nose, mouth, throat, and face: Denies mucositis or sore throat Respiratory: Denies cough, dyspnea or wheezes Cardiovascular: Denies palpitation, chest discomfort Gastrointestinal: Denies nausea, heartburn or change in bowel habits Skin: Denies abnormal skin rashes Lymphatics: Denies new lymphadenopathy or easy bruising Neurological: Denies numbness, tingling or new weaknesses Behavioral/Psych: Mood is stable, no new changes  Extremities: No lower extremity edema Breast: denies any pain or lumps or nodules in either breasts All other systems were reviewed with the patient and are negative.  I have reviewed the past medical history, past surgical history, social history and family history with the patient and they are unchanged from previous note.  ALLERGIES:  has No Known Allergies.  MEDICATIONS:  Current Outpatient Medications  Medication Sig  Dispense Refill  . acetaminophen (TYLENOL) 500 MG tablet Take 500 mg by mouth daily as needed (arm pain).    Marland Kitchen ALPRAZolam (XANAX) 0.5  MG tablet Take 1 tablet (0.5 mg total) by mouth 2 (two) times daily as needed for anxiety or sleep. 60 tablet 3  . amLODipine (NORVASC) 10 MG tablet Take 1 tablet (10 mg total) by mouth daily. 90 tablet 3  . apixaban (ELIQUIS) 5 MG TABS tablet Take 1 tablet (5 mg total) by mouth 2 (two) times daily. Take 10 mg bid for 7 days orally and then decrease to 5 mg bid orally thereafter. 60 tablet 5  . Cholecalciferol (VITAMIN D3) 1000 UNITS CAPS Take 1 capsule by mouth daily.     . clotrimazole-betamethasone (LOTRISONE) cream Apply 1 application topically 2 (two) times daily. 90 g 1  . dronabinol (MARINOL) 2.5 MG capsule Take 1 capsule (2.5 mg total) by mouth 2 (two) times daily before a meal. 60 capsule 0  . gabapentin (NEURONTIN) 100 MG capsule Take 1 capsule (100 mg total) by mouth at bedtime. 30 capsule 5  . hydrochlorothiazide (HYDRODIURIL) 25 MG tablet Take 25 mg by mouth daily.  3  . lidocaine-prilocaine (EMLA) cream Apply to affected area once 30 g 3  . losartan (COZAAR) 100 MG tablet Take 100 mg by mouth daily.  3  . meclizine (ANTIVERT) 12.5 MG tablet Take 1 tablet (12.5 mg total) by mouth 2 (two) times daily as needed for dizziness. 10 tablet 0  . nystatin (MYCOSTATIN/NYSTOP) powder Apply 1 Dose topically daily.  3  . ondansetron (ZOFRAN) 8 MG tablet Take 1 tablet (8 mg total) by mouth every 8 (eight) hours as needed for nausea or vomiting. 30 tablet 0  . pioglitazone (ACTOS) 15 MG tablet Take 1 tablet (15 mg total) by mouth daily. 30 tablet 3  . prochlorperazine (COMPAZINE) 10 MG tablet Take 1 tablet (10 mg total) by mouth every 6 (six) hours as needed (Nausea or vomiting). 30 tablet 1  . Zoledronic Acid (ZOMETA) 4 MG/100ML IVPB Inject 4 mg into the vein every 3 (three) months.     No current facility-administered medications for this visit.    Facility-Administered Medications Ordered in Other Visits  Medication Dose Route Frequency Provider Last Rate Last Dose  . sodium chloride flush  (NS) 0.9 % injection 10 mL  10 mL Intravenous PRN Curt Bears, MD   10 mL at 09/04/19 1128    PHYSICAL EXAMINATION: ECOG PERFORMANCE STATUS: 1 - Symptomatic but completely ambulatory  Vitals:   09/04/19 1147  BP: 125/73  Pulse: 94  Resp: 16  Temp: 98 F (36.7 C)  SpO2: 100%   Filed Weights   09/04/19 1147  Weight: 155 lb 8 oz (70.5 kg)    GENERAL: alert, no distress and comfortable SKIN: skin color, texture, turgor are normal, no rashes or significant lesions EYES: normal, Conjunctiva are pink and non-injected, sclera clear OROPHARYNX: no exudate, no erythema and lips, buccal mucosa, and tongue normal  NECK: supple, thyroid normal size, non-tender, without nodularity LYMPH: no palpable lymphadenopathy in the cervical, axillary or inguinal LUNGS: clear to auscultation and percussion with normal breathing effort HEART: regular rate & rhythm and no murmurs and no lower extremity edema ABDOMEN: abdomen soft, non-tender and normal bowel sounds MUSCULOSKELETAL: no cyanosis of digits and no clubbing  NEURO: alert & oriented x 3 with fluent speech, no focal motor/sensory deficits EXTREMITIES: No lower extremity edema  LABORATORY DATA:  I have reviewed the data as  listed CMP Latest Ref Rng & Units 08/27/2019 08/10/2019 07/11/2019  Glucose 70 - 99 mg/dL 94 98 117(H)  BUN 8 - 23 mg/dL 21 24(H) 28(H)  Creatinine 0.44 - 1.00 mg/dL 2.47(H) 2.41(H) 2.41(H)  Sodium 135 - 145 mmol/L 138 140 138  Potassium 3.5 - 5.1 mmol/L 3.8 4.4 4.1  Chloride 98 - 111 mmol/L 105 107 105  CO2 22 - 32 mmol/L 18(L) 19(L) 19(L)  Calcium 8.9 - 10.3 mg/dL 9.7 9.3 9.1  Total Protein 6.5 - 8.1 g/dL 8.0 7.5 7.9  Total Bilirubin 0.3 - 1.2 mg/dL 0.6 0.4 0.3  Alkaline Phos 38 - 126 U/L 147(H) 126 119  AST 15 - 41 U/L 58(H) 47(H) 42(H)  ALT 0 - 44 U/L _0 Lab Results  Component Value Date   WBC 8.3 09/04/2019   HGB 10.4 (L) 09/04/2019   HCT 32.7 (L) 09/04/2019   MCV 91.3 09/04/2019   PLT 270  09/04/2019   NEUTROABS 6.1 09/04/2019    ASSESSMENT & PLAN:  Breast cancer metastasized to bone, right ([place]) Metastatic breast cancer with bone metastases along with lung and liver metastases, progressed on Ibrance with anastrozoleas well as Faslodex with Ibrance8/29/19- 04/23/19.  Radiology review: 05/23/2018 CT CAP:New liver metastases 3.1 cm,increase in lung nodules, increase in sclerotic lesion of T11 and T9and S1 vertebral body. Liver biopsy 06/19/2018: Metastatic breast cancer ER 100%, PR 0%, Ki-67 50%, HER-2 negative BRCA1 mutation: Negative  PDL 1: 0%and foundation 1:PI 3 kinase mutation was identified ----------------------------------------------------------------------------------------------------------------------------------------------------------------------- Current treatment:Faslodex with Alpelisib started 04/23/2019-08/16/2019 discontinued for progression Patient's insurance rejected Xgeva.[email or web address] every 3 months.   Palliative radiation: 01/25/2019-02/07/2019 CT chest abdomen pelvis 08/10/2019: Progression of disease.  Multiple lung nodules increase in size 1.1 cm to 1.4 cm, 1 cm to 1.2 cm, 4 x 1.4 cm unchanged, large right liver mass 15 x 10 cm previously was 11 x 8 cm, new left lobe lesion 2.3 cm, 1.6 cm left lesion, left adrenal mass 2.6 cm  Current treatment: Cycle 1 pembrolizumab Patient understands the toxicities of immunotherapy including immune mediated adverse effects.  Return to clinic in 3 weeks for cycle 2    No orders of the defined types were placed in this encounter.  The patient has a good understanding of the overall plan. she agrees with it. she will call with any problems that may develop before the next visit here.  Nicholas Lose, MD 09/04/2019  Julious Oka Dorshimer am acting as scribe for Dr. Nicholas Lose.  I have reviewed the above documentation for accuracy and completeness, and I agree with the above.

## 2019-09-04 ENCOUNTER — Inpatient Hospital Stay: Payer: Medicare HMO | Attending: Oncology

## 2019-09-04 ENCOUNTER — Other Ambulatory Visit: Payer: Self-pay

## 2019-09-04 ENCOUNTER — Inpatient Hospital Stay (HOSPITAL_BASED_OUTPATIENT_CLINIC_OR_DEPARTMENT_OTHER): Payer: Medicare HMO | Admitting: Hematology and Oncology

## 2019-09-04 ENCOUNTER — Inpatient Hospital Stay: Payer: Medicare HMO

## 2019-09-04 ENCOUNTER — Other Ambulatory Visit: Payer: Self-pay | Admitting: Hematology and Oncology

## 2019-09-04 VITALS — BP 125/73 | HR 94 | Temp 98.0°F | Resp 16 | Ht 63.0 in | Wt 155.5 lb

## 2019-09-04 DIAGNOSIS — C787 Secondary malignant neoplasm of liver and intrahepatic bile duct: Secondary | ICD-10-CM | POA: Diagnosis not present

## 2019-09-04 DIAGNOSIS — M7989 Other specified soft tissue disorders: Secondary | ICD-10-CM | POA: Insufficient documentation

## 2019-09-04 DIAGNOSIS — R531 Weakness: Secondary | ICD-10-CM | POA: Insufficient documentation

## 2019-09-04 DIAGNOSIS — Z7901 Long term (current) use of anticoagulants: Secondary | ICD-10-CM | POA: Insufficient documentation

## 2019-09-04 DIAGNOSIS — C7951 Secondary malignant neoplasm of bone: Secondary | ICD-10-CM

## 2019-09-04 DIAGNOSIS — C7800 Secondary malignant neoplasm of unspecified lung: Secondary | ICD-10-CM | POA: Diagnosis not present

## 2019-09-04 DIAGNOSIS — R0602 Shortness of breath: Secondary | ICD-10-CM | POA: Diagnosis not present

## 2019-09-04 DIAGNOSIS — R630 Anorexia: Secondary | ICD-10-CM | POA: Diagnosis not present

## 2019-09-04 DIAGNOSIS — Z79811 Long term (current) use of aromatase inhibitors: Secondary | ICD-10-CM | POA: Insufficient documentation

## 2019-09-04 DIAGNOSIS — Z79899 Other long term (current) drug therapy: Secondary | ICD-10-CM | POA: Diagnosis not present

## 2019-09-04 DIAGNOSIS — Z5112 Encounter for antineoplastic immunotherapy: Secondary | ICD-10-CM | POA: Diagnosis not present

## 2019-09-04 DIAGNOSIS — Z23 Encounter for immunization: Secondary | ICD-10-CM | POA: Insufficient documentation

## 2019-09-04 DIAGNOSIS — Z7189 Other specified counseling: Secondary | ICD-10-CM

## 2019-09-04 DIAGNOSIS — Z9221 Personal history of antineoplastic chemotherapy: Secondary | ICD-10-CM | POA: Insufficient documentation

## 2019-09-04 DIAGNOSIS — Z170 Estrogen receptor positive status [ER+]: Secondary | ICD-10-CM | POA: Insufficient documentation

## 2019-09-04 DIAGNOSIS — Z95828 Presence of other vascular implants and grafts: Secondary | ICD-10-CM

## 2019-09-04 DIAGNOSIS — C50911 Malignant neoplasm of unspecified site of right female breast: Secondary | ICD-10-CM

## 2019-09-04 DIAGNOSIS — J90 Pleural effusion, not elsewhere classified: Secondary | ICD-10-CM | POA: Diagnosis not present

## 2019-09-04 DIAGNOSIS — R5383 Other fatigue: Secondary | ICD-10-CM | POA: Diagnosis not present

## 2019-09-04 DIAGNOSIS — Z96642 Presence of left artificial hip joint: Secondary | ICD-10-CM | POA: Diagnosis not present

## 2019-09-04 DIAGNOSIS — Z923 Personal history of irradiation: Secondary | ICD-10-CM | POA: Insufficient documentation

## 2019-09-04 LAB — LIPID PANEL
Cholesterol: 163 mg/dL (ref 0–200)
HDL: 44 mg/dL (ref >40)
LDL Cholesterol: 97 mg/dL (ref 0–99)
Total CHOL/HDL Ratio: 3.7 RATIO
Triglycerides: 112 mg/dL (ref <150)
VLDL: 22 mg/dL (ref 0–40)

## 2019-09-04 LAB — CBC WITH DIFFERENTIAL (CANCER CENTER ONLY)
Abs Immature Granulocytes: 0.09 10*3/uL — ABNORMAL HIGH (ref 0.00–0.07)
Basophils Absolute: 0.1 10*3/uL (ref 0.0–0.1)
Basophils Relative: 1 %
Eosinophils Absolute: 0.1 10*3/uL (ref 0.0–0.5)
Eosinophils Relative: 1 %
HCT: 32.7 % — ABNORMAL LOW (ref 36.0–46.0)
Hemoglobin: 10.4 g/dL — ABNORMAL LOW (ref 12.0–15.0)
Immature Granulocytes: 1 %
Lymphocytes Relative: 11 %
Lymphs Abs: 0.9 10*3/uL (ref 0.7–4.0)
MCH: 29.1 pg (ref 26.0–34.0)
MCHC: 31.8 g/dL (ref 30.0–36.0)
MCV: 91.3 fL (ref 80.0–100.0)
Monocytes Absolute: 1 10*3/uL (ref 0.1–1.0)
Monocytes Relative: 13 %
Neutro Abs: 6.1 10*3/uL (ref 1.7–7.7)
Neutrophils Relative %: 73 %
Platelet Count: 270 10*3/uL (ref 150–400)
RBC: 3.58 MIL/uL — ABNORMAL LOW (ref 3.87–5.11)
RDW: 16.5 % — ABNORMAL HIGH (ref 11.5–15.5)
WBC Count: 8.3 10*3/uL (ref 4.0–10.5)
nRBC: 0 % (ref 0.0–0.2)

## 2019-09-04 LAB — CMP (CANCER CENTER ONLY)
ALT: 14 U/L (ref 0–44)
AST: 59 U/L — ABNORMAL HIGH (ref 15–41)
Albumin: 2.9 g/dL — ABNORMAL LOW (ref 3.5–5.0)
Alkaline Phosphatase: 138 U/L — ABNORMAL HIGH (ref 38–126)
Anion gap: 13 (ref 5–15)
BUN: 23 mg/dL (ref 8–23)
CO2: 18 mmol/L — ABNORMAL LOW (ref 22–32)
Calcium: 9.5 mg/dL (ref 8.9–10.3)
Chloride: 107 mmol/L (ref 98–111)
Creatinine: 2.14 mg/dL — ABNORMAL HIGH (ref 0.44–1.00)
GFR, Est AFR Am: 25 mL/min — ABNORMAL LOW (ref >60)
GFR, Estimated: 22 mL/min — ABNORMAL LOW (ref >60)
Glucose, Bld: 95 mg/dL (ref 70–99)
Potassium: 4 mmol/L (ref 3.5–5.1)
Sodium: 138 mmol/L (ref 135–145)
Total Bilirubin: 0.6 mg/dL (ref 0.3–1.2)
Total Protein: 7.2 g/dL (ref 6.5–8.1)

## 2019-09-04 LAB — TSH: TSH: 1.346 u[IU]/mL (ref 0.308–3.960)

## 2019-09-04 MED ORDER — HEPARIN SOD (PORK) LOCK FLUSH 100 UNIT/ML IV SOLN
500.0000 [IU] | Freq: Once | INTRAVENOUS | Status: AC | PRN
  Administered 2019-09-04: 15:00:00 500 [IU]
  Filled 2019-09-04: qty 5

## 2019-09-04 MED ORDER — SODIUM CHLORIDE 0.9 % IV SOLN
200.0000 mg | Freq: Once | INTRAVENOUS | Status: AC
  Administered 2019-09-04: 14:00:00 200 mg via INTRAVENOUS
  Filled 2019-09-04: qty 8

## 2019-09-04 MED ORDER — ZOLEDRONIC ACID 4 MG/5ML IV CONC
3.0000 mg | Freq: Once | INTRAVENOUS | Status: AC
  Administered 2019-09-04: 3 mg via INTRAVENOUS
  Filled 2019-09-04: qty 3.75

## 2019-09-04 MED ORDER — SODIUM CHLORIDE 0.9 % IV SOLN
Freq: Once | INTRAVENOUS | Status: AC
  Administered 2019-09-04: 13:00:00 via INTRAVENOUS
  Filled 2019-09-04: qty 250

## 2019-09-04 MED ORDER — PROCHLORPERAZINE MALEATE 10 MG PO TABS
10.0000 mg | ORAL_TABLET | Freq: Once | ORAL | Status: AC
  Administered 2019-09-04: 13:00:00 10 mg via ORAL

## 2019-09-04 MED ORDER — INFLUENZA VAC A&B SA ADJ QUAD 0.5 ML IM PRSY
0.5000 mL | PREFILLED_SYRINGE | Freq: Once | INTRAMUSCULAR | Status: AC
  Administered 2019-09-04: 13:00:00 0.5 mL via INTRAMUSCULAR

## 2019-09-04 MED ORDER — SODIUM CHLORIDE 0.9% FLUSH
10.0000 mL | INTRAVENOUS | Status: DC | PRN
  Administered 2019-09-04: 15:00:00 10 mL
  Filled 2019-09-04: qty 10

## 2019-09-04 MED ORDER — INFLUENZA VAC A&B SA ADJ QUAD 0.5 ML IM PRSY
PREFILLED_SYRINGE | INTRAMUSCULAR | Status: AC
  Filled 2019-09-04: qty 0.5

## 2019-09-04 MED ORDER — PROCHLORPERAZINE MALEATE 10 MG PO TABS
ORAL_TABLET | ORAL | Status: AC
  Filled 2019-09-04: qty 1

## 2019-09-04 MED ORDER — SODIUM CHLORIDE 0.9% FLUSH
10.0000 mL | INTRAVENOUS | Status: AC | PRN
  Administered 2019-09-04: 11:00:00 10 mL via INTRAVENOUS
  Filled 2019-09-04: qty 10

## 2019-09-04 NOTE — Assessment & Plan Note (Signed)
Metastatic breast cancer with bone metastases along with lung and liver metastases, progressed on Ibrance with anastrozoleas well as Faslodex with Ibrance8/29/19- 04/23/19.  Radiology review: 05/23/2018 CT CAP:New liver metastases 3.1 cm,increase in lung nodules, increase in sclerotic lesion of T11 and T9and S1 vertebral body. Liver biopsy 06/19/2018: Metastatic breast cancer ER 100%, PR 0%, Ki-67 50%, HER-2 negative BRCA1 mutation: Negative  PDL 1: 0%and foundation 1:PI 3 kinase mutation was identified ----------------------------------------------------------------------------------------------------------------------------------------------------------------------- Current treatment:Faslodex with Alpelisib started 04/23/2019-08/16/2019 discontinued for progression Patient's insurance rejected Xgeva.OnZometa every 3 months.   Palliative radiation: 01/25/2019-02/07/2019 CT chest abdomen pelvis 08/10/2019: Progression of disease.  Multiple lung nodules increase in size 1.1 cm to 1.4 cm, 1 cm to 1.2 cm, 4 x 1.4 cm unchanged, large right liver mass 15 x 10 cm previously was 11 x 8 cm, new left lobe lesion 2.3 cm, 1.6 cm left lesion, left adrenal mass 2.6 cm  Current treatment: Cycle 1 pembrolizumab Patient understands the toxicities of immunotherapy including immune mediated adverse effects.  Return to clinic in 3 weeks for cycle 2

## 2019-09-04 NOTE — Progress Notes (Signed)
Per Dr Lindi Adie OK for treatment with CRT of 2.14

## 2019-09-04 NOTE — Patient Instructions (Addendum)
[facility] Discharge Instructions for Patients Receiving Chemotherapy Today you received the following chemotherapy agents Keytruda  To help prevent nausea and vomiting after your treatment, we encourage you to take your nausea medication as prescribed.  If you develop nausea and vomiting that is not controlled by your nausea medication, call the clinic.   BELOW ARE SYMPTOMS THAT SHOULD BE REPORTED IMMEDIATELY:  *FEVER GREATER THAN 100.5 F  *CHILLS WITH OR WITHOUT FEVER  NAUSEA AND VOMITING THAT IS NOT CONTROLLED WITH YOUR NAUSEA MEDICATION  *UNUSUAL SHORTNESS OF BREATH  *UNUSUAL BRUISING OR BLEEDING  TENDERNESS IN MOUTH AND THROAT WITH OR WITHOUT PRESENCE OF ULCERS  *URINARY PROBLEMS  *BOWEL PROBLEMS  UNUSUAL RASH Items with * indicate a potential emergency and should be followed up as soon as possible.  Feel free to call the clinic should you have any questions or concerns. The clinic phone number is [phone].  Please show the [facility] at check-in to the Emergency Department and triage nurse.  Pembrolizumab injection(keytruda) What is this medicine? PEMBROLIZUMAB (pem broe liz ue mab) is a monoclonal antibody. It is used to treat bladder cancer, cervical cancer, endometrial cancer, esophageal cancer, head and neck cancer, hepatocellular cancer, Hodgkin lymphoma, kidney cancer, lymphoma, melanoma, Merkel cell carcinoma, lung cancer, stomach cancer, urothelial cancer, and cancers that have a certain genetic condition. This medicine may be used for other purposes; ask your health care provider or pharmacist if you have questions. COMMON BRAND NAME(S): Keytruda What should I tell my health care provider before I take this medicine? They need to know if you have any of these conditions:  diabetes  immune system problems  inflammatory bowel disease  liver disease  lung or breathing disease  lupus  received or scheduled to receive an organ  transplant or a stem-cell transplant that uses donor stem cells  an unusual or allergic reaction to pembrolizumab, other medicines, foods, dyes, or preservatives  pregnant or trying to get pregnant  breast-feeding How should I use this medicine? This medicine is for infusion into a vein. It is given by a health care professional in a hospital or clinic setting. A special MedGuide will be given to you before each treatment. Be sure to read this information carefully each time. Talk to your pediatrician regarding the use of this medicine in children. While this drug may be prescribed for selected conditions, precautions do apply. Overdosage: If you think you have taken too much of this medicine contact a poison control center or emergency room at once. NOTE: This medicine is only for you. Do not share this medicine with others. What if I miss a dose? It is important not to miss your dose. Call your doctor or health care professional if you are unable to keep an appointment. What may interact with this medicine? Interactions have not been studied. Give your health care provider a list of all the medicines, herbs, non-prescription drugs, or dietary supplements you use. Also tell them if you smoke, drink alcohol, or use illegal drugs. Some items may interact with your medicine. This list may not describe all possible interactions. Give your health care provider a list of all the medicines, herbs, non-prescription drugs, or dietary supplements you use. Also tell them if you smoke, drink alcohol, or use illegal drugs. Some items may interact with your medicine. What should I watch for while using this medicine? Your condition will be monitored carefully while you are receiving this medicine. You may need blood work  done while you are taking this medicine. Do not become pregnant while taking this medicine or for 4 months after stopping it. Women should inform their doctor if they wish to become  pregnant or think they might be pregnant. There is a potential for serious side effects to an unborn child. Talk to your health care professional or pharmacist for more information. Do not breast-feed an infant while taking this medicine or for 4 months after the last dose. What side effects may I notice from receiving this medicine? Side effects that you should report to your doctor or health care professional as soon as possible:  allergic reactions like skin rash, itching or hives, swelling of the face, lips, or tongue  bloody or black, tarry  breathing problems  changes in vision  chest pain  chills  confusion  constipation  cough  diarrhea  dizziness or feeling faint or lightheaded  fast or irregular heartbeat  fever  flushing  hair loss  joint pain  low blood counts - this medicine may decrease the number of white blood cells, red blood cells and platelets. You may be at increased risk for infections and bleeding.  muscle pain  muscle weakness  persistent headache  redness, blistering, peeling or loosening of the skin, including inside the mouth  signs and symptoms of high blood sugar such as dizziness; dry mouth; dry skin; fruity breath; nausea; stomach pain; increased hunger or thirst; increased urination  signs and symptoms of kidney injury like trouble passing urine or change in the amount of urine  signs and symptoms of liver injury like dark urine, light-colored stools, loss of appetite, nausea, right upper belly pain, yellowing of the eyes or skin  sweating  swollen lymph nodes  weight loss Side effects that usually do not require medical attention (report to your doctor or health care professional if they continue or are bothersome):  decreased appetite  muscle pain  tiredness This list may not describe all possible side effects. Call your doctor for medical advice about side effects. You may report side effects to FDA at  1-800-FDA-1088. Where should I keep my medicine? This drug is given in a hospital or clinic and will not be stored at home. NOTE: This sheet is a summary. It may not cover all possible information. If you have questions about this medicine, talk to your doctor, pharmacist, or health care provider.  2020 Elsevier/Gold Standard (2018-11-14 13:46:58)   Zoledronic Acid injection (Hypercalcemia, Oncology)(zometa) What is this medicine? ZOLEDRONIC ACID (ZOE le dron ik AS id) lowers the amount of calcium loss from bone. It is used to treat too much calcium in your blood from cancer. It is also used to prevent complications of cancer that has spread to the bone. This medicine may be used for other purposes; ask your health care provider or pharmacist if you have questions. COMMON BRAND NAME(S): Zometa What should I tell my health care provider before I take this medicine? They need to know if you have any of these conditions:  aspirin-sensitive asthma  cancer, especially if you are receiving medicines used to treat cancer  dental disease or wear dentures  infection  kidney disease  receiving corticosteroids like dexamethasone or prednisone  an unusual or allergic reaction to zoledronic acid, other medicines, foods, dyes, or preservatives  pregnant or trying to get pregnant  breast-feeding How should I use this medicine? This medicine is for infusion into a vein. It is given by a health care professional in a  hospital or clinic setting. Talk to your pediatrician regarding the use of this medicine in children. Special care may be needed. Overdosage: If you think you have taken too much of this medicine contact a poison control center or emergency room at once. NOTE: This medicine is only for you. Do not share this medicine with others. What if I miss a dose? It is important not to miss your dose. Call your doctor or health care professional if you are unable to keep an appointment. What  may interact with this medicine?  certain antibiotics given by injection  NSAIDs, medicines for pain and inflammation, like ibuprofen or naproxen  some diuretics like bumetanide, furosemide  teriparatide  thalidomide This list may not describe all possible interactions. Give your health care provider a list of all the medicines, herbs, non-prescription drugs, or dietary supplements you use. Also tell them if you smoke, drink alcohol, or use illegal drugs. Some items may interact with your medicine. What should I watch for while using this medicine? Visit your doctor or health care professional for regular checkups. It may be some time before you see the benefit from this medicine. Do not stop taking your medicine unless your doctor tells you to. Your doctor may order blood tests or other tests to see how you are doing. Women should inform their doctor if they wish to become pregnant or think they might be pregnant. There is a potential for serious side effects to an unborn child. Talk to your health care professional or pharmacist for more information. You should make sure that you get enough calcium and vitamin D while you are taking this medicine. Discuss the foods you eat and the vitamins you take with your health care professional. Some people who take this medicine have severe bone, joint, and/or muscle pain. This medicine may also increase your risk for jaw problems or a broken thigh bone. Tell your doctor right away if you have severe pain in your jaw, bones, joints, or muscles. Tell your doctor if you have any pain that does not go away or that gets worse. Tell your dentist and dental surgeon that you are taking this medicine. You should not have major dental surgery while on this medicine. See your dentist to have a dental exam and fix any dental problems before starting this medicine. Take good care of your teeth while on this medicine. Make sure you see your dentist for regular follow-up  appointments. What side effects may I notice from receiving this medicine? Side effects that you should report to your doctor or health care professional as soon as possible:  allergic reactions like skin rash, itching or hives, swelling of the face, lips, or tongue  anxiety, confusion, or depression  breathing problems  changes in vision  eye pain  feeling faint or lightheaded, falls  jaw pain, especially after dental work  mouth sores  muscle cramps, stiffness, or weakness  redness, blistering, peeling or loosening of the skin, including inside the mouth  trouble passing urine or change in the amount of urine Side effects that usually do not require medical attention (report to your doctor or health care professional if they continue or are bothersome):  bone, joint, or muscle pain  constipation  diarrhea  fever  hair loss  irritation at site where injected  loss of appetite  nausea, vomiting  stomach upset  trouble sleeping  trouble swallowing  weak or tired This list may not describe all possible side effects. Call your  doctor for medical advice about side effects. You may report side effects to FDA at 1-800-FDA-1088. Where should I keep my medicine? This drug is given in a hospital or clinic and will not be stored at home. NOTE: This sheet is a summary. It may not cover all possible information. If you have questions about this medicine, talk to your doctor, pharmacist, or health care provider.  2020 Elsevier/Gold Standard (2014-03-16 14:19:39)

## 2019-09-05 ENCOUNTER — Telehealth: Payer: Self-pay | Admitting: *Deleted

## 2019-09-05 NOTE — Telephone Encounter (Signed)
-----   Message from Thomasene Lot, RN sent at 09/04/2019  3:16 PM EST ----- Regarding: Dr Julien Nordmann 1st tx f/u call 1st tx f/u call

## 2019-09-05 NOTE — Telephone Encounter (Signed)
Called pt to see how she did with her new treatment of Keytruda yest.  She reports feeling hyper last eve but rested well & no c/o's today.  She reports knowing what to call about & knows how to reach [place].

## 2019-09-12 ENCOUNTER — Emergency Department (HOSPITAL_COMMUNITY): Payer: Medicare HMO

## 2019-09-12 ENCOUNTER — Inpatient Hospital Stay (HOSPITAL_COMMUNITY)
Admission: EM | Admit: 2019-09-12 | Discharge: 2019-09-22 | DRG: 091 | Disposition: A | Discharge status: Home Health | Payer: Medicare HMO | Attending: Internal Medicine | Admitting: Internal Medicine

## 2019-09-12 ENCOUNTER — Encounter (HOSPITAL_COMMUNITY): Payer: Self-pay | Admitting: Pharmacy Technician

## 2019-09-12 ENCOUNTER — Telehealth: Payer: Self-pay

## 2019-09-12 ENCOUNTER — Other Ambulatory Visit: Payer: Self-pay

## 2019-09-12 DIAGNOSIS — R5381 Other malaise: Secondary | ICD-10-CM | POA: Diagnosis present

## 2019-09-12 DIAGNOSIS — R1310 Dysphagia, unspecified: Secondary | ICD-10-CM | POA: Diagnosis not present

## 2019-09-12 DIAGNOSIS — I82721 Chronic embolism and thrombosis of deep veins of right upper extremity: Secondary | ICD-10-CM | POA: Diagnosis present

## 2019-09-12 DIAGNOSIS — R29818 Other symptoms and signs involving the nervous system: Secondary | ICD-10-CM | POA: Diagnosis not present

## 2019-09-12 DIAGNOSIS — R29810 Facial weakness: Secondary | ICD-10-CM | POA: Diagnosis present

## 2019-09-12 DIAGNOSIS — M1990 Unspecified osteoarthritis, unspecified site: Secondary | ICD-10-CM | POA: Diagnosis present

## 2019-09-12 DIAGNOSIS — Z79899 Other long term (current) drug therapy: Secondary | ICD-10-CM

## 2019-09-12 DIAGNOSIS — Z7901 Long term (current) use of anticoagulants: Secondary | ICD-10-CM

## 2019-09-12 DIAGNOSIS — Z6827 Body mass index (BMI) 27.0-27.9, adult: Secondary | ICD-10-CM

## 2019-09-12 DIAGNOSIS — Z923 Personal history of irradiation: Secondary | ICD-10-CM

## 2019-09-12 DIAGNOSIS — D630 Anemia in neoplastic disease: Secondary | ICD-10-CM | POA: Diagnosis present

## 2019-09-12 DIAGNOSIS — Q394 Esophageal web: Secondary | ICD-10-CM | POA: Diagnosis not present

## 2019-09-12 DIAGNOSIS — K222 Esophageal obstruction: Secondary | ICD-10-CM

## 2019-09-12 DIAGNOSIS — E785 Hyperlipidemia, unspecified: Secondary | ICD-10-CM | POA: Diagnosis present

## 2019-09-12 DIAGNOSIS — Z79891 Long term (current) use of opiate analgesic: Secondary | ICD-10-CM

## 2019-09-12 DIAGNOSIS — C50911 Malignant neoplasm of unspecified site of right female breast: Secondary | ICD-10-CM | POA: Diagnosis present

## 2019-09-12 DIAGNOSIS — Z8249 Family history of ischemic heart disease and other diseases of the circulatory system: Secondary | ICD-10-CM

## 2019-09-12 DIAGNOSIS — C7951 Secondary malignant neoplasm of bone: Secondary | ICD-10-CM | POA: Diagnosis present

## 2019-09-12 DIAGNOSIS — Z8673 Personal history of transient ischemic attack (TIA), and cerebral infarction without residual deficits: Secondary | ICD-10-CM

## 2019-09-12 DIAGNOSIS — F419 Anxiety disorder, unspecified: Secondary | ICD-10-CM | POA: Diagnosis present

## 2019-09-12 DIAGNOSIS — R0682 Tachypnea, not elsewhere classified: Secondary | ICD-10-CM | POA: Diagnosis not present

## 2019-09-12 DIAGNOSIS — N184 Chronic kidney disease, stage 4 (severe): Secondary | ICD-10-CM | POA: Diagnosis present

## 2019-09-12 DIAGNOSIS — R079 Chest pain, unspecified: Secondary | ICD-10-CM | POA: Diagnosis not present

## 2019-09-12 DIAGNOSIS — C7800 Secondary malignant neoplasm of unspecified lung: Secondary | ICD-10-CM | POA: Diagnosis present

## 2019-09-12 DIAGNOSIS — C7972 Secondary malignant neoplasm of left adrenal gland: Secondary | ICD-10-CM | POA: Diagnosis present

## 2019-09-12 DIAGNOSIS — J910 Malignant pleural effusion: Secondary | ICD-10-CM | POA: Diagnosis present

## 2019-09-12 DIAGNOSIS — K2289 Other specified disease of esophagus: Secondary | ICD-10-CM

## 2019-09-12 DIAGNOSIS — N179 Acute kidney failure, unspecified: Secondary | ICD-10-CM | POA: Diagnosis present

## 2019-09-12 DIAGNOSIS — I10 Essential (primary) hypertension: Secondary | ICD-10-CM | POA: Diagnosis present

## 2019-09-12 DIAGNOSIS — E43 Unspecified severe protein-calorie malnutrition: Secondary | ICD-10-CM | POA: Insufficient documentation

## 2019-09-12 DIAGNOSIS — Z87891 Personal history of nicotine dependence: Secondary | ICD-10-CM

## 2019-09-12 DIAGNOSIS — G8929 Other chronic pain: Secondary | ICD-10-CM | POA: Diagnosis present

## 2019-09-12 DIAGNOSIS — R1314 Dysphagia, pharyngoesophageal phase: Secondary | ICD-10-CM | POA: Diagnosis present

## 2019-09-12 DIAGNOSIS — Z9114 Patient's other noncompliance with medication regimen: Secondary | ICD-10-CM

## 2019-09-12 DIAGNOSIS — B49 Unspecified mycosis: Secondary | ICD-10-CM | POA: Diagnosis not present

## 2019-09-12 DIAGNOSIS — Z20828 Contact with and (suspected) exposure to other viral communicable diseases: Secondary | ICD-10-CM | POA: Diagnosis present

## 2019-09-12 DIAGNOSIS — H538 Other visual disturbances: Secondary | ICD-10-CM | POA: Diagnosis not present

## 2019-09-12 DIAGNOSIS — K225 Diverticulum of esophagus, acquired: Secondary | ICD-10-CM | POA: Diagnosis not present

## 2019-09-12 DIAGNOSIS — R0789 Other chest pain: Secondary | ICD-10-CM | POA: Diagnosis not present

## 2019-09-12 DIAGNOSIS — K228 Other specified diseases of esophagus: Secondary | ICD-10-CM

## 2019-09-12 DIAGNOSIS — R000 Tachycardia, unspecified: Secondary | ICD-10-CM

## 2019-09-12 DIAGNOSIS — R0602 Shortness of breath: Secondary | ICD-10-CM

## 2019-09-12 DIAGNOSIS — R200 Anesthesia of skin: Secondary | ICD-10-CM | POA: Diagnosis present

## 2019-09-12 DIAGNOSIS — J449 Chronic obstructive pulmonary disease, unspecified: Secondary | ICD-10-CM | POA: Diagnosis present

## 2019-09-12 DIAGNOSIS — N189 Chronic kidney disease, unspecified: Secondary | ICD-10-CM | POA: Diagnosis not present

## 2019-09-12 DIAGNOSIS — C787 Secondary malignant neoplasm of liver and intrahepatic bile duct: Secondary | ICD-10-CM | POA: Diagnosis present

## 2019-09-12 DIAGNOSIS — I129 Hypertensive chronic kidney disease with stage 1 through stage 4 chronic kidney disease, or unspecified chronic kidney disease: Secondary | ICD-10-CM | POA: Diagnosis present

## 2019-09-12 DIAGNOSIS — F418 Other specified anxiety disorders: Secondary | ICD-10-CM | POA: Diagnosis not present

## 2019-09-12 DIAGNOSIS — R29702 NIHSS score 2: Secondary | ICD-10-CM | POA: Diagnosis present

## 2019-09-12 DIAGNOSIS — K5909 Other constipation: Secondary | ICD-10-CM | POA: Diagnosis present

## 2019-09-12 DIAGNOSIS — R1110 Vomiting, unspecified: Secondary | ICD-10-CM

## 2019-09-12 DIAGNOSIS — C801 Malignant (primary) neoplasm, unspecified: Secondary | ICD-10-CM | POA: Diagnosis not present

## 2019-09-12 DIAGNOSIS — Z90710 Acquired absence of both cervix and uterus: Secondary | ICD-10-CM

## 2019-09-12 DIAGNOSIS — Z96641 Presence of right artificial hip joint: Secondary | ICD-10-CM | POA: Diagnosis present

## 2019-09-12 DIAGNOSIS — C50919 Malignant neoplasm of unspecified site of unspecified female breast: Secondary | ICD-10-CM | POA: Diagnosis not present

## 2019-09-12 DIAGNOSIS — R1111 Vomiting without nausea: Secondary | ICD-10-CM | POA: Diagnosis not present

## 2019-09-12 DIAGNOSIS — J90 Pleural effusion, not elsewhere classified: Secondary | ICD-10-CM | POA: Diagnosis not present

## 2019-09-12 LAB — DIFFERENTIAL
Abs Immature Granulocytes: 0.06 10*3/uL (ref 0.00–0.07)
Basophils Absolute: 0.1 10*3/uL (ref 0.0–0.1)
Basophils Relative: 1 %
Eosinophils Absolute: 0.1 10*3/uL (ref 0.0–0.5)
Eosinophils Relative: 1 %
Immature Granulocytes: 1 %
Lymphocytes Relative: 21 %
Lymphs Abs: 1.9 10*3/uL (ref 0.7–4.0)
Monocytes Absolute: 1.3 10*3/uL — ABNORMAL HIGH (ref 0.1–1.0)
Monocytes Relative: 14 %
Neutro Abs: 5.8 10*3/uL (ref 1.7–7.7)
Neutrophils Relative %: 62 %

## 2019-09-12 LAB — I-STAT CHEM 8, ED
BUN: 27 mg/dL — ABNORMAL HIGH (ref 8–23)
Calcium, Ion: 1.11 mmol/L — ABNORMAL LOW (ref 1.15–1.40)
Chloride: 107 mmol/L (ref 98–111)
Creatinine, Ser: 2.1 mg/dL — ABNORMAL HIGH (ref 0.44–1.00)
Glucose, Bld: 106 mg/dL — ABNORMAL HIGH (ref 70–99)
HCT: 38 % (ref 36.0–46.0)
Hemoglobin: 12.9 g/dL (ref 12.0–15.0)
Potassium: 4 mmol/L (ref 3.5–5.1)
Sodium: 139 mmol/L (ref 135–145)
TCO2: 17 mmol/L — ABNORMAL LOW (ref 22–32)

## 2019-09-12 LAB — CBC
HCT: 36.6 % (ref 36.0–46.0)
Hemoglobin: 11.5 g/dL — ABNORMAL LOW (ref 12.0–15.0)
MCH: 28.7 pg (ref 26.0–34.0)
MCHC: 31.4 g/dL (ref 30.0–36.0)
MCV: 91.3 fL (ref 80.0–100.0)
Platelets: 301 10*3/uL (ref 150–400)
RBC: 4.01 MIL/uL (ref 3.87–5.11)
RDW: 17.2 % — ABNORMAL HIGH (ref 11.5–15.5)
WBC: 9.2 10*3/uL (ref 4.0–10.5)
nRBC: 0 % (ref 0.0–0.2)

## 2019-09-12 LAB — COMPREHENSIVE METABOLIC PANEL
ALT: 17 U/L (ref 0–44)
AST: 76 U/L — ABNORMAL HIGH (ref 15–41)
Albumin: 2.9 g/dL — ABNORMAL LOW (ref 3.5–5.0)
Alkaline Phosphatase: 141 U/L — ABNORMAL HIGH (ref 38–126)
Anion gap: 17 — ABNORMAL HIGH (ref 5–15)
BUN: 22 mg/dL (ref 8–23)
CO2: 16 mmol/L — ABNORMAL LOW (ref 22–32)
Calcium: 9.2 mg/dL (ref 8.9–10.3)
Chloride: 106 mmol/L (ref 98–111)
Creatinine, Ser: 2.36 mg/dL — ABNORMAL HIGH (ref 0.44–1.00)
GFR calc Af Amer: 22 mL/min — ABNORMAL LOW (ref >60)
GFR calc non Af Amer: 19 mL/min — ABNORMAL LOW (ref >60)
Glucose, Bld: 113 mg/dL — ABNORMAL HIGH (ref 70–99)
Potassium: 4 mmol/L (ref 3.5–5.1)
Sodium: 139 mmol/L (ref 135–145)
Total Bilirubin: 1 mg/dL (ref 0.3–1.2)
Total Protein: 7.9 g/dL (ref 6.5–8.1)

## 2019-09-12 LAB — APTT: aPTT: 26 seconds (ref 24–36)

## 2019-09-12 LAB — PROTIME-INR
INR: 1.2 (ref 0.8–1.2)
Prothrombin Time: 15 seconds (ref 11.4–15.2)

## 2019-09-12 LAB — BRAIN NATRIURETIC PEPTIDE: B Natriuretic Peptide: 130.3 pg/mL — ABNORMAL HIGH (ref 0.0–100.0)

## 2019-09-12 LAB — TROPONIN I (HIGH SENSITIVITY)
Troponin I (High Sensitivity): 19 ng/L — ABNORMAL HIGH (ref <18)
Troponin I (High Sensitivity): 21 ng/L — ABNORMAL HIGH (ref <18)

## 2019-09-12 MED ORDER — GABAPENTIN 100 MG PO CAPS
100.0000 mg | ORAL_CAPSULE | Freq: Every day | ORAL | Status: DC
  Administered 2019-09-13 – 2019-09-20 (×8): 100 mg via ORAL
  Filled 2019-09-12 (×9): qty 1

## 2019-09-12 MED ORDER — APIXABAN 5 MG PO TABS
5.0000 mg | ORAL_TABLET | Freq: Two times a day (BID) | ORAL | Status: DC
  Administered 2019-09-13 – 2019-09-18 (×12): 5 mg via ORAL
  Filled 2019-09-12 (×13): qty 1

## 2019-09-12 MED ORDER — LORAZEPAM 2 MG/ML IJ SOLN
0.2500 mg | Freq: Once | INTRAMUSCULAR | Status: DC
  Filled 2019-09-12: qty 1

## 2019-09-12 MED ORDER — TECHNETIUM TO 99M ALBUMIN AGGREGATED
1.5000 | Freq: Once | INTRAVENOUS | Status: AC | PRN
  Administered 2019-09-12: 17:00:00 1.5 via INTRAVENOUS

## 2019-09-12 MED ORDER — SORBITOL 70 % SOLN
30.0000 mL | Freq: Every day | Status: DC | PRN
  Filled 2019-09-12: qty 30

## 2019-09-12 MED ORDER — HYDROCHLOROTHIAZIDE 25 MG PO TABS: 25.0000 mg | ORAL_TABLET | Freq: Every day | ORAL | Status: DC

## 2019-09-12 MED ORDER — ONDANSETRON HCL 4 MG PO TABS
8.0000 mg | ORAL_TABLET | Freq: Three times a day (TID) | ORAL | Status: DC | PRN
  Filled 2019-09-12: qty 2

## 2019-09-12 MED ORDER — SODIUM CHLORIDE 0.9% FLUSH
3.0000 mL | Freq: Once | INTRAVENOUS | Status: DC
Start: 2019-09-12 — End: 2019-09-22

## 2019-09-12 MED ORDER — DOCUSATE SODIUM 100 MG PO CAPS
100.0000 mg | ORAL_CAPSULE | Freq: Two times a day (BID) | ORAL | Status: DC
  Administered 2019-09-13 – 2019-09-16 (×6): 100 mg via ORAL
  Filled 2019-09-12 (×7): qty 1

## 2019-09-12 MED ORDER — TRAMADOL HCL 50 MG PO TABS
50.0000 mg | ORAL_TABLET | Freq: Two times a day (BID) | ORAL | Status: DC
  Administered 2019-09-13 – 2019-09-22 (×17): 50 mg via ORAL
  Filled 2019-09-12 (×17): qty 1

## 2019-09-12 MED ORDER — ALPRAZOLAM 0.5 MG PO TABS
0.5000 mg | ORAL_TABLET | Freq: Two times a day (BID) | ORAL | Status: DC | PRN
  Administered 2019-09-13: 01:00:00 0.5 mg via ORAL
  Filled 2019-09-12: qty 1

## 2019-09-12 MED ORDER — MIRTAZAPINE 15 MG PO TABS
7.5000 mg | ORAL_TABLET | Freq: Every day | ORAL | Status: DC
  Administered 2019-09-13 – 2019-09-20 (×9): 7.5 mg via ORAL
  Filled 2019-09-12 (×10): qty 1

## 2019-09-12 MED ORDER — ACETAMINOPHEN 500 MG PO TABS: 500.0000 mg | ORAL_TABLET | Freq: Every day | ORAL | Status: DC | PRN

## 2019-09-12 MED ORDER — AMLODIPINE BESYLATE 10 MG PO TABS
10.0000 mg | ORAL_TABLET | Freq: Every day | ORAL | Status: DC
  Administered 2019-09-13 – 2019-09-22 (×9): 10 mg via ORAL
  Filled 2019-09-12 (×6): qty 1
  Filled 2019-09-12: qty 2
  Filled 2019-09-12 (×3): qty 1

## 2019-09-12 MED ORDER — PROCHLORPERAZINE MALEATE 10 MG PO TABS
10.0000 mg | ORAL_TABLET | Freq: Four times a day (QID) | ORAL | Status: DC | PRN
  Administered 2019-09-13 – 2019-09-15 (×2): 10 mg via ORAL
  Filled 2019-09-12 (×3): qty 1

## 2019-09-12 MED ORDER — LOSARTAN POTASSIUM 50 MG PO TABS: 100.0000 mg | ORAL_TABLET | Freq: Every day | ORAL | Status: DC

## 2019-09-12 MED ORDER — DEXTROSE-NACL 5-0.45 % IV SOLN
INTRAVENOUS | Status: DC
  Administered 2019-09-13: 07:00:00 via INTRAVENOUS

## 2019-09-12 MED ORDER — PIOGLITAZONE HCL 15 MG PO TABS
15.0000 mg | ORAL_TABLET | Freq: Every day | ORAL | Status: DC
  Administered 2019-09-13 – 2019-09-22 (×9): 15 mg via ORAL
  Filled 2019-09-12 (×10): qty 1

## 2019-09-12 MED ORDER — MECLIZINE HCL 12.5 MG PO TABS: 12.5000 mg | ORAL_TABLET | Freq: Two times a day (BID) | ORAL | Status: DC | PRN

## 2019-09-12 MED ORDER — DRONABINOL 2.5 MG PO CAPS
2.5000 mg | ORAL_CAPSULE | Freq: Two times a day (BID) | ORAL | Status: DC
  Administered 2019-09-13 – 2019-09-22 (×13): 2.5 mg via ORAL
  Filled 2019-09-12 (×15): qty 1

## 2019-09-12 NOTE — H&P (Signed)
History and Physical    [name] [name] FYB:[id number] DOB: 06-11-1941 DOA: 09/12/2019  PCP: Cassandria Anger, MD (Confirm with patient/family/NH records and if not entered, this has to be entered at [facility] point of entry) Patient coming from: Patient is coming from home  I have personally briefly reviewed patient's old medical records in [place]  Chief Complaint: Right facial weakness, swallowing difficulty, ability to keep down food.  HPI: [name] [name] is a 78 y.o. female with medical history significant of cancer in 2002 treated successfully.  She now with a recurrence with widely metastatic disease evolving liver lungs bone.  She is followed by oncology service and she is started and failed several therapies and currently is on Keytruda IV every 3 weeks.  She is status post palliative radiation therapy to the neck and subclavian area.  Patient did have a DVT of the right upper extremity is caused her to have significant limitations in the use of her right arm.  Patient does live alone.  She reports that she has had this right-sided weakness for several weeks but this morning she had the onset of weakness in the right face.  She also complains of a change in her speech pattern some difficulty in swallowing.  The symptoms she presented to the [facility] emergency department for evaluation   ED Course: Patient was hemodynamically stable in the emergency department.  She did complain of mild chest discomfort and was short of breath.  She was tachycardic.  EKG without any acute changes.  Her history of DVT and her intermittent use of Eliquis she had a ventilation/perfusion scan which was low probability for PE.  She was seen by Dr. Leonie Man for the stroke service who felt she did not have an acute stroke the timeframe for any medical intervention.  Dr. Leonie Man did recommend a follow-up MRI rule out small CVA versus metastatic disease to the brain.  Troponin levels did return #1 at 21 #2 at 19  or not significant.  Because of her complex situation including difficulty in managing ADLs, difficulty with swallow and a question of neurologic changes she is admitted for observation by [facility]  Review of Systems: As per HPI otherwise 10 point review of systems negative.  Marked constipation is a problem now with very loose stools that are not controlled.  She reports she generally feels ill   Past Medical History:  Diagnosis Date   Anxiety    hx of   Asthma    Breast cancer ([place])    2002; metastatic in 2012, right breast, spread to left hip in 2012   Cerebrovascular accident [facility])    R thalamic CVA 01/2010   Chemotherapy induced neutropenia ([place]) 08/15/2017   Dyslipidemia    Encounter for therapeutic drug monitoring 04/14/2017   Goals of care, counseling/discussion 04/19/2017   Hyperlipemia    Hypertension    Low back pain    Osteoarthritis    TIA (transient ischemic attack)    x2 in 2011   Vertigo 2014    Past Surgical History:  Procedure Laterality Date   ABDOMINAL HYSTERECTOMY     complete   BREAST LUMPECTOMY     Right breast 2002   JOINT REPLACEMENT  2012   L hip due to breast ca met   TOTAL HIP ARTHROPLASTY Right 10/28/2015   Procedure: RIGHT TOTAL HIP ARTHROPLASTY ANTERIOR APPROACH;  Surgeon: Paralee Cancel, MD;  Location: [facility] ORS;  Service: Orthopedics;  Laterality: Right;   Social  history -patient was married for 2 years and now is a widow. She has 2 sons who live out of state, 4 grandchildren including a grandson who lives in [place].  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  She does live alone and is here to for managed all of her ADLs.   reports that she quit smoking about 8 years ago. Her smoking use included cigarettes. She has a 40.00 pack-year smoking history. She has never used smokeless tobacco. She reports current alcohol use. She reports that she does not use drugs.  No  Known Allergies  Family History  Problem Relation Age of Onset   Hypertension Mother    Hypertension Father    Cancer Neg Hx    Unacceptable: Noncontributory, unremarkable, or negative. Acceptable: Family history reviewed and not pertinent (If you reviewed it)  Prior to Admission medications   Medication Sig Start Date End Date Taking? Authorizing Provider  acetaminophen (TYLENOL) 500 MG tablet Take 500 mg by mouth daily as needed (arm pain).    [provider]  ALPRAZolam Duanne Moron) 0.5 MG tablet Take 1 tablet (0.5 mg total) by mouth 2 (two) times daily as needed for anxiety or sleep. 03/13/19   Nicholas Lose, MD  amLODipine (NORVASC) 10 MG tablet Take 1 tablet (10 mg total) by mouth daily. 03/13/19   Nicholas Lose, MD  apixaban (ELIQUIS) 5 MG TABS tablet Take 1 tablet (5 mg total) by mouth 2 (two) times daily. Take 10 mg bid for 7 days orally and then decrease to 5 mg bid orally thereafter. 01/11/19   [name], [name], MD  Cholecalciferol (VITAMIN D3) 1000 UNITS CAPS Take 1 capsule by mouth daily.     [provider]  clotrimazole-betamethasone (LOTRISONE) cream Apply 1 application topically 2 (two) times daily. 06/07/19 06/06/20  [name], [name], MD  dronabinol (MARINOL) 2.5 MG capsule Take 1 capsule (2.5 mg total) by mouth 2 (two) times daily before a meal. 08/27/19   Nicholas Lose, MD  gabapentin (NEURONTIN) 100 MG capsule Take 1 capsule (100 mg total) by mouth at bedtime. 01/11/19   [name], [name], MD  hydrochlorothiazide (HYDRODIURIL) 25 MG tablet Take 25 mg by mouth daily. 08/26/18   [provider]  lidocaine-prilocaine (EMLA) cream Apply to affected area once 08/27/19   Nicholas Lose, MD  losartan (COZAAR) 100 MG tablet Take 100 mg by mouth daily. 08/26/18   [provider]  meclizine (ANTIVERT) 12.5 MG tablet Take 1 tablet (12.5 mg total) by mouth 2 (two) times daily as needed for dizziness. 06/29/14   Sciacca, Marissa, PA-C  mirtazapine  (REMERON) 7.5 MG tablet Take 7.5 mg by mouth daily. 06/29/19   [provider]  nystatin (MYCOSTATIN/NYSTOP) powder Apply 1 Dose topically daily. 04/06/18   [provider]  ondansetron (ZOFRAN) 8 MG tablet Take 1 tablet (8 mg total) by mouth every 8 (eight) hours as needed for nausea or vomiting. 07/18/19   Nicholas Lose, MD  pioglitazone (ACTOS) 15 MG tablet Take 1 tablet (15 mg total) by mouth daily. 04/26/19   Nicholas Lose, MD  prochlorperazine (COMPAZINE) 10 MG tablet Take 1 tablet (10 mg total) by mouth every 6 (six) hours as needed (Nausea or vomiting). 08/27/19   Nicholas Lose, MD  traMADol (ULTRAM) 50 MG tablet Take 50 mg by mouth 2 (two) times daily. 06/29/19   [provider]  Zoledronic Acid (ZOMETA) 4 MG/100ML IVPB Inject 4 mg into the vein every  3 (three) months.    [provider]    Physical Exam: Vitals:   09/12/19 1430 09/12/19 1432 09/12/19 1500 09/12/19 1545  BP: 117/81  138/81 136/74  Pulse: 99  (!) 102 98  Resp: (!) _0 Temp: 97.7 F (36.5 C)     TempSrc: Oral     SpO2: 98%  100% 100%  Weight:  69.5 kg      Constitutional: NAD, calm, comfortable Vitals:   09/12/19 1430 09/12/19 1432 09/12/19 1500 09/12/19 1545  BP: 117/81  138/81 136/74  Pulse: 99  (!) 102 98  Resp: (!) _1 Temp: 97.7 F (36.5 C)     TempSrc: Oral     SpO2: 98%  100% 100%  Weight:  69.5 kg     :General appearance elderly woman is in no acute distress.   Eyes: PERRL, lids and conjunctivae normal ENMT: Mucous membranes are moist. Posterior pharynx clear of any exudate or lesions. Neck: normal, supple, no masses, no thyromegaly Respiratory: clear to auscultation bilaterally, no wheezing, no crackles. Normal respiratory effort without increased work of breathing at rest. No accessory muscle use.  Aspirations are somewhat shallow Cardiovascular: Regular rate and rhythm, no murmurs / rubs / gallops. No extremity edema.  Race pedal pulses. No carotid  bruits.  Abdomen: no tenderness, exam hindered by the patient's girth but her liver edge was below the costal margin. Bowel sounds positive and hypoactive.  Musculoskeletal: no clubbing / cyanosis.  Right upper extremity is larger than the left ,no joint deformity upper and lower extremities. Good ROM, no contractures.  Decreased muscle tone.  Skin: no rashes, lesions, ulcers. No induration Neurologic: No facial asymmetry.  PERRLA, EOMI. right grip strength is diminished -a chronic problem since her DVT in the right upper extremity.  Left grip strength is normal patient is able to move both legs.  Speech is clear and intelligible but the patient reports that appears changed to her Psychiatric: Normal judgment and insight. Alert and oriented x 3. Normal mood.     Labs on Admission: I have personally reviewed following labs and imaging studies  CBC: Recent Labs  Lab 09/12/19 1358 09/12/19 1404  WBC 9.2  --   NEUTROABS 5.8  --   HGB 11.5* 12.9  HCT 36.6 38.0  MCV 91.3  --   PLT 301  --    Basic Metabolic Panel: Recent Labs  Lab 09/12/19 1358 09/12/19 1404  NA 139 139  K 4.0 4.0  CL 106 107  CO2 16*  --   GLUCOSE 113* 106*  BUN 22 27*  CREATININE 2.36* 2.10*  CALCIUM 9.2  --    GFR: Estimated Creatinine Clearance: 21.3 mL/min (A) (by C-G formula based on SCr of 2.1 mg/dL (H)). Liver Function Tests: Recent Labs  Lab 09/12/19 1358  AST 76*  ALT 17  ALKPHOS 141*  BILITOT 1.0  PROT 7.9  ALBUMIN 2.9*   No results for input(s): LIPASE, AMYLASE in the last 168 hours. No results for input(s): AMMONIA in the last 168 hours. Coagulation Profile: Recent Labs  Lab 09/12/19 1358  INR 1.2   Cardiac Enzymes: No results for input(s): CKTOTAL, CKMB, CKMBINDEX, TROPONINI in the last 168 hours. BNP (last 3 results) No results for input(s): PROBNP in the last 8760 hours. HbA1C: No results for input(s): HGBA1C in the last 72 hours. CBG: No results for input(s): GLUCAP in the  last 168 hours. Lipid Profile: No results for input(s):  CHOL, HDL, LDLCALC, TRIG, CHOLHDL, LDLDIRECT in the last 72 hours. Thyroid Function Tests: No results for input(s): TSH, T4TOTAL, FREET4, T3FREE, THYROIDAB in the last 72 hours. Anemia Panel: No results for input(s): VITAMINB12, FOLATE, FERRITIN, TIBC, IRON, RETICCTPCT in the last 72 hours. Urine analysis:    Component Value Date/Time   COLORURINE YELLOW 08/26/2016 [place] 08/26/2016 0944   LABSPEC 1.020 08/26/2016 0944   PHURINE 6.5 08/26/2016 [place] 08/26/2016 0944   HGBUR NEGATIVE 08/26/2016 0944   BILIRUBINUR NEGATIVE 08/26/2016 0944   KETONESUR NEGATIVE 08/26/2016 0944   PROTEINUR 30 (A) 10/17/2015 1031   UROBILINOGEN 0.2 08/26/2016 0944   NITRITE NEGATIVE 08/26/2016 0944   LEUKOCYTESUR NEGATIVE 08/26/2016 0944    Radiological Exams on Admission: Ct Chest Wo Contrast  Result Date: 09/12/2019 CLINICAL DATA:  Chest pain. Shortness of breath. Metastatic breast cancer. EXAM: CT CHEST WITHOUT CONTRAST TECHNIQUE: Multidetector CT imaging of the chest was performed following the standard protocol without IV contrast. COMPARISON:  08/10/2019 FINDINGS: Cardiovascular: The heart size is normal. No substantial pericardial effusion. Coronary artery calcification is evident. Atherosclerotic calcification is noted in the wall of the thoracic aorta. Prominence of the main pulmonary arteries suggests pulmonary arterial hypertension. Similar appearance of pericardial nodules. Index nodule measured previously at 7 x 11 mm is 8 x 12 mm today. Right Port-A-Cath tip is in the distal SVC. Mediastinum/Nodes: No mediastinal lymphadenopathy. No evidence for gross hilar lymphadenopathy although assessment is limited by the lack of intravenous contrast on today's study. The esophagus has normal imaging features. There is no axillary lymphadenopathy. Lungs/Pleura: Stable pleuroparenchymal scarring in the right apex,  likely radiation fibrosis. Bilateral pulmonary nodules again noted. Posterior left upper lobe nodule measuring 13 mm today was 12 mm when remeasured in a similar fashion on the prior study. 15 mm anterior left lung nodule (51/5) was 14 mm when measured in a similar fashion on the prior study. 2 cm right infrahilar lower lobe nodule (86/5) was 2 cm previously. Other scattered pulmonary nodules are similar to prior. Small to moderate right pleural effusion is similar. A small left pleural effusion has progressed in the interval. Upper Abdomen: Heterogeneous liver parenchyma compatible with the patient's known metastatic disease, not well demonstrated on noncontrast imaging today. Left adrenal lesion incompletely visualized but approximately 2.7 x 1.1 cm today compared to 2.7 x 1.1 cm previously. Musculoskeletal: Widespread sclerotic bony metastases are similar. 11 mm sclerotic lesion posterior T12 vertebral body on today's study was 11 mm when remeasured in a similar fashion on the prior exam. IMPRESSION: 1. Bilateral pulmonary nodules are stable to minimally progressed in the short interval since prior study. 2. Small to moderate right pleural effusion is stable. A small left pleural effusion on today's study has increased in the interval since prior exam. 3. Similar appearance of pericardial nodules consistent with metastatic disease. 4. Marked heterogeneity of liver parenchyma consistent with bulky metastatic involvement. This is not well demonstrated on today's noncontrast exam. 5. Widespread bony metastatic disease, similar to prior. Electronically Signed   By: Misty Stanley M.D.   On: 09/12/2019 18:31   Nm Pulmonary Perfusion  Result Date: 09/12/2019 CLINICAL DATA:  Breast cancer, history pulmonary embolism, acute onset of RIGHT upper extremity weakness and facial paresthesias today EXAM: NUCLEAR MEDICINE PERFUSION LUNG SCAN TECHNIQUE: Perfusion images were obtained in multiple projections after intravenous  injection of radiopharmaceutical. Ventilation scans intentionally deferred if perfusion scan and chest x-ray adequate for interpretation during COVID 19 epidemic.  RADIOPHARMACEUTICALS:  1.5 mCi Tc-1mMAA IV COMPARISON:  None FINDINGS: Diminished perfusion at the lung bases greater posteriorly. Patient has bibasilar atelectasis and small LEFT pleural effusion on radiograph. No additional segmental or subsegmental perfusion defects are identified. Ventilation exam was not performed, unknown COVID status. Findings represent a low probability for pulmonary embolism. IMPRESSION: Mildly diminished perfusion at the lung bases especially posteriorly, less significant than suspected from the bibasilar atelectasis and LEFT pleural effusion seen on chest radiograph. Low probability of pulmonary embolism. Electronically Signed   By: MLavonia DanaM.D.   On: 09/12/2019 18:00   Dg Chest Portable 1 View  Result Date: 09/12/2019 CLINICAL DATA:  Chest pain short of breath.  Breast cancer. EXAM: PORTABLE CHEST 1 VIEW COMPARISON:  06/29/2014.  Chest CT 06/10/2019 FINDINGS: Mild bibasilar airspace disease left greater than right. Small bilateral effusions. Small densities left upper lobe and left hilum. Possible small density right upper lobe. These are compatible with metastatic deposits as seen on recent CT. Port-A-Cath tip in the lower SVC. IMPRESSION: Bibasilar airspace disease and small pleural effusions. Possible pneumonia or malignant effusion. Metastatic disease in the lungs. Electronically Signed   By: CFranchot GalloM.D.   On: 09/12/2019 15:50   Ct Head Code Stroke Wo Contrast  Result Date: 09/12/2019 CLINICAL DATA:  Code stroke.  Left facial droop.  Stroke EXAM: CT HEAD WITHOUT CONTRAST TECHNIQUE: Contiguous axial images were obtained from the base of the skull through the vertex without intravenous contrast. COMPARISON:  CT head 06/29/2014 FINDINGS: Brain: Mild atrophy which has progressed in the interval. Negative  for hydrocephalus. Small hypodensity right pons compatible with chronic infarct is unchanged. Negative for acute infarct.  Negative for hemorrhage or mass. Vascular: Negative for hyperdense vessel Skull: Negative Sinuses/Orbits: Air-fluid level sphenoid sinus. Remaining sinuses clear. Negative orbit Other: None ASPECTS (ADukesStroke Program Early CT Score) - Ganglionic level infarction (caudate, lentiform nuclei, internal capsule, insula, M1-M3 cortex): 7 - Supraganglionic infarction (M4-M6 cortex): 3 Total score (0-10 with 10 being normal): 10 IMPRESSION: 1. No acute intracranial abnormality. 2. ASPECTS is 10 Electronically Signed   By: CFranchot GalloM.D.   On: 09/12/2019 14:33    EKG: Independently reviewed.  EKG reveals sinus tachycardia, left atrial enlargement.  No evidence of any acute injury.  Assessment/Plan Active Problems:   Chest pain   Essential hypertension   Situational anxiety   Breast cancer metastasized to bone, right (HCC)   Facial weakness  (please populate well all problems here in Problem List. (For example, if patient is on BP meds at home and you resume or decide to hold them, it is a problem that needs to be her. Same for CAD, COPD, HLD and so on)   1.  Facial weakness -patient with report of new facial weakness.  Neurologic exam is fairly unremarkable.  Right upper extremity weakness is chronic.  Patient has been seen by neurology.the patient had no acute symptoms and was not a candidate for thrombolytic therapy.  Patient has had generalized weakness and reports swallowing difficulty.  She reports a change in her speech pattern. Plan observation admission  MRI brain, as recommended by neurology, to rule out CVA versus brain mets  PT/OT evaluation to assess the patient's ability to live independently  Speech pathology consult in regard to swallowing difficulty and change in speech pattern  2.  Essential hypertension -continue home medications  3.  Chest pain -suspect  this is respiratory related.  He does complain of shortness of breath.  Q was low probability for PE.  Her lung mets may be contributing to her shortness of breath.  Her troponin levels were negligibly elevated.  4.  Metastatic breast cancer -she was widely metastatic disease.  She is receiving palliative chemotherapy with Keytruda infusions every 3 weeks.  He is followed closely by the oncology service.  This is her probable primary problem.  Discussed her poor prognosis and the question of cardiac respiratory resuscitation.  At this point she has good understanding of her prognosis but wishes full treatment of long enough to see her family who will be coming in over the next month  DVT prophylaxis: Eliquis (Lovenox/Heparin/SCD's/anticoagulated/None (if comfort care) Code Status: Full code (Full/Partial (specify details) Family Communication: Spoke with her grandson planing her situation and her admission (Specify name, relationship. Do not write "discussed with patient". Specify tel # if discussed over the phone) Disposition Plan: To be determined after PT OT evaluation (specify when and where you expect patient to be discharged) Consults called:  -Dr. Leonie Man for neurology (with names) Admission status: Observation-medical (inpatient / obs / tele / medical floor / SDU)   Adella Hare MD [facility] Pager [phone]  If 7PM-7AM, please contact night-coverage [email or web address] Password [facility]  09/12/2019, 8:44 PM

## 2019-09-12 NOTE — Code Documentation (Signed)
78 yo female coming from home with complaints of right facial numbness and weakness that started at 1300 today. Reports that her right arm has been week for a couple weeks and the numbness moved down to her right arm after initial being in her face. She also complains of left leg weakness for a couple days. EMS called and activated a Code Stroke. Stroke Team met patient at the bridge. Initial NIHSS 2 due to right sensory decreased and right arm drift. CT completed. Patient not tPA candidate due to stroke not suspected and inconsistent focal exam. Placed on monitor and in ED 40. Stroke cancelled per MD Leonie Man. Handoff given to [employer], Therapist, sports.

## 2019-09-12 NOTE — Consult Note (Deleted)
[name]   Requesting Physician: Dr. Sherry Ruffing    Chief Complaint: numbness left arm  History obtained from:  Patient   HPI:                                                                                                                                         [name] [name] is an 78 y.o. female  With PMH, TIA ( 2011), HTN, HLD, Breast cancer x3 ( 2002, 2012 metastatic left hip), CVA ( right thalamic 2011) who presented to [facility] ed as a code stroke. Code stroke was later canceled.     Per EMS/patient patient was at home and and had right facial numbness and weakness. Eventually her whole right side became numb. Her right arm has been weak for a few weeks per patient.   She also has been having left leg weakness and was having trouble getting off the commode. She was on eliquis but stopped taking it about 3-4 weeks ago. She also had cloudy vision when her numbness first happened. The cloudy vision did not last long. Patient also report constipation and list that as her reason for discontinuing eliquis. Denies any CP, smoking, ETOH, but endorses being SOB. Patient stated that her right arm has been weak for a few weeks.   ED course:  CTH: no hemorrhage BP: 116/56 BG: 127    Date last known well: 09/12/2019 Time last known well:1300 tPA Given: no; non focal exam Modified Rankin: Rankin Score=1 NIHSS:3     Past Medical History:  Diagnosis Date  . Anxiety    hx of  . Asthma   . Breast cancer ([place])    2002; metastatic in 2012, right breast, spread to left hip in 2012  . Cerebrovascular accident [facility])    R thalamic CVA 01/2010  . Chemotherapy induced neutropenia ([place]) 08/15/2017  . Dyslipidemia   . Encounter for therapeutic drug monitoring 04/14/2017  . Goals of care, counseling/discussion 04/19/2017  . Hyperlipemia   . Hypertension   . Low back pain   . Osteoarthritis   . TIA (transient ischemic attack)    x2 in  2011  . Vertigo 2014    Past Surgical History:  Procedure Laterality Date  . ABDOMINAL HYSTERECTOMY     complete  . BREAST LUMPECTOMY     Right breast 2002  . JOINT REPLACEMENT  2012   L hip due to breast ca met  . TOTAL HIP ARTHROPLASTY Right 10/28/2015   Procedure: RIGHT TOTAL HIP ARTHROPLASTY ANTERIOR APPROACH;  Surgeon: Paralee Cancel, MD;  Location: [facility] ORS;  Service: Orthopedics;  Laterality: Right;  Family History  Problem Relation Age of Onset  . Hypertension Mother   . Hypertension Father   . Cancer Neg Hx     Social History:  reports that she quit smoking about 8 years ago. Her smoking use included cigarettes. She has a 40.00 pack-year smoking history. She has never used smokeless tobacco. She reports current alcohol use. She reports that she does not use drugs.  Allergies: No Known Allergies  Medications:                                                                                                                          Current Facility-Administered Medications  Medication Dose Route Frequency Provider Last Rate Last Dose  . sodium chloride flush (NS) 0.9 % injection 3 mL  3 mL Intravenous Once Tegeler, Gwenyth Allegra, MD       Current Outpatient Medications  Medication Sig Dispense Refill  . acetaminophen (TYLENOL) 500 MG tablet Take 500 mg by mouth daily as needed (arm pain).    Marland Kitchen ALPRAZolam (XANAX) 0.5 MG tablet Take 1 tablet (0.5 mg total) by mouth 2 (two) times daily as needed for anxiety or sleep. 60 tablet 3  . amLODipine (NORVASC) 10 MG tablet Take 1 tablet (10 mg total) by mouth daily. 90 tablet 3  . apixaban (ELIQUIS) 5 MG TABS tablet Take 1 tablet (5 mg total) by mouth 2 (two) times daily. Take 10 mg bid for 7 days orally and then decrease to 5 mg bid orally thereafter. 60 tablet 5  . Cholecalciferol (VITAMIN D3) 1000 UNITS CAPS Take 1 capsule by mouth daily.     . clotrimazole-betamethasone (LOTRISONE) cream Apply 1 application topically 2 (two) times  daily. 90 g 1  . dronabinol (MARINOL) 2.5 MG capsule Take 1 capsule (2.5 mg total) by mouth 2 (two) times daily before a meal. 60 capsule 0  . gabapentin (NEURONTIN) 100 MG capsule Take 1 capsule (100 mg total) by mouth at bedtime. 30 capsule 5  . hydrochlorothiazide (HYDRODIURIL) 25 MG tablet Take 25 mg by mouth daily.  3  . lidocaine-prilocaine (EMLA) cream Apply to affected area once 30 g 3  . losartan (COZAAR) 100 MG tablet Take 100 mg by mouth daily.  3  . meclizine (ANTIVERT) 12.5 MG tablet Take 1 tablet (12.5 mg total) by mouth 2 (two) times daily as needed for dizziness. 10 tablet 0  . nystatin (MYCOSTATIN/NYSTOP) powder Apply 1 Dose topically daily.  3  . ondansetron (ZOFRAN) 8 MG tablet Take 1 tablet (8 mg total) by mouth every 8 (eight) hours as needed for nausea or vomiting. 30 tablet 0  . pioglitazone (ACTOS) 15 MG tablet Take 1 tablet (15 mg total) by mouth daily. 30 tablet 3  . prochlorperazine (COMPAZINE) 10 MG tablet Take 1 tablet (10 mg total) by mouth every 6 (six) hours as needed (Nausea or vomiting). 30 tablet 1  . Zoledronic Acid (ZOMETA) 4 MG/100ML IVPB Inject 4 mg into the vein every  3 (three) months.     Facility-Administered Medications Ordered in Other Encounters  Medication Dose Route Frequency Provider Last Rate Last Dose  . sodium chloride flush (NS) 0.9 % injection 10 mL  10 mL Intravenous PRN Curt Bears, MD   10 mL at 09/04/19 1128     ROS:                                                                                                                                       ROS was performed and is negative except as noted in HPI    General Examination:                                                                                                      There were no vitals taken for this visit.  Physical Exam  Constitutional: Appears well-developed and well-nourished.  Psych: Affect appropriate to situation Eyes: Normal external eye and  conjunctiva. HENT: Normocephalic, no lesions, without obvious abnormality.   Musculoskeletal-no joint tenderness, deformity or swelling Cardiovascular: Normal rate and regular rhythm.  Respiratory: Effort normal, non-labored breathing saturations WNL on RA, but seems SOB. GI: Soft.  No distension. There is no tenderness. Port in left chest wall. Skin: WDI  Neurological Examination Mental Status: Alert, oriented, thought content appropriate.  Speech fluent without evidence of aphasia.  Able to follow  commands without difficulty. Cranial Nerves: II:  Visual fields grossly normal,  III,IV, VI: ptosis not present, extra-ocular motions intact bilaterally, pupils equal, round, reactive to light and accommodation V,VII: smile symmetric, facial light touch sensation normal bilaterally VIII: hearing normal bilaterally IX,X: uvula rises midline XI: bilateral shoulder shrug XII: midline tongue extension Motor: Right : Upper extremity   4/5  Left:     Upper extremity   5/5  Lower extremity   5/5   Lower extremity   5/5 Tone and bulk:normal tone throughout; no atrophy noted Patient stated right arm has been weak for a few weeks.  Sensory: Pinprick and light touch intact throughout, bilaterally Deep Tendon Reflexes: 2+ and symmetric biceps and patella Plantars: Right: downgoing   Left: downgoing Cerebellar: normal finger-to-nose, normal rapid alternating movements and normal heel-to-shin test Gait: normal gait and station   Lab Results: Basic Metabolic Panel: Recent Labs  Lab 09/12/19 1404  NA 139  K 4.0  CL 107  GLUCOSE 106*  BUN 27*  CREATININE 2.10*    CBC: Recent Labs  Lab 09/12/19 1358 09/12/19 1404  WBC 9.2  --  NEUTROABS 5.8  --   HGB 11.5* 12.9  HCT 36.6 38.0  MCV 91.3  --   PLT 301  --     CBG: No results for input(s): GLUCAP in the last 168 hours.  Imaging: Ct Head Code Stroke Wo Contrast  Result Date: 09/12/2019 CLINICAL DATA:  Code stroke.  Left facial  droop.  Stroke EXAM: CT HEAD WITHOUT CONTRAST TECHNIQUE: Contiguous axial images were obtained from the base of the skull through the vertex without intravenous contrast. COMPARISON:  CT head 06/29/2014 FINDINGS: Brain: Mild atrophy which has progressed in the interval. Negative for hydrocephalus. Small hypodensity right pons compatible with chronic infarct is unchanged. Negative for acute infarct.  Negative for hemorrhage or mass. Vascular: Negative for hyperdense vessel Skull: Negative Sinuses/Orbits: Air-fluid level sphenoid sinus. Remaining sinuses clear. Negative orbit Other: None ASPECTS ([place] Stroke Program Early CT Score) - Ganglionic level infarction (caudate, lentiform nuclei, internal capsule, insula, M1-M3 cortex): 7 - Supraganglionic infarction (M4-M6 cortex): 3 Total score (0-10 with 10 being normal): 10 IMPRESSION: 1. No acute intracranial abnormality. 2. ASPECTS is 10 Electronically Signed   By: Franchot Gallo M.D.   On: 09/12/2019 14:33       Laurey Morale, MSN, NP-C [facility] [phone]  09/12/2019, 2:05 PM   Attending physician note to follow with Assessment and plan .   Assessment: KAREEN JEFFERYS is an 78 y.o. female  With PMH, TIA ( 2011), HTN, HLD, Breast cancer x3 ( 2002, 2012 metastatic left hip), CVA ( right thalamic 2011) who presented to [facility] ed as a code stroke. Code stroke was later canceled. CTH: no acute abnormality. Exam was non-focal. Patient reported that right arm weakness has been present for a few weeks. The numbness has almost completely resolved. Recommend that patient restart her eliquis. She is also very SOB Stroke Risk Factors - hypercoagulable state, hyperlipidemia and hypertension    Recommendations: -- restart Eliquis     --please page stroke NP  Or  PA  Or MD from 8am -4 pm  as this patient from this time will be  followed by the stroke.   You can look them up on [email or web address]  Password [email or web address]

## 2019-09-12 NOTE — ED Provider Notes (Signed)
I received this patient in signout from Dr. Sherry Ruffing. Briefly, pt has known metastatic breast cancer with history of blood clots but noncompliant with anticoagulation.  She has had at least 1 month of right-sided weakness/numbness and came in today for concerns for right facial weakness.  Seen by neurology, canceled code stroke.  Having shortness of breath, chest pain, and tachycardia concerning for possible PE.  At time of signout, pending cardiac lab work as well as VQ scan and CT chest.  BNP not significantly elevated at 130.  Troponin mildly elevated at 19, of unclear significance given no ST changes on EKG and creatinine 2.  Obtained chest CT given abnormalities on chest x-ray; chest CT showed mild pleural effusions but no significant changes, redemonstrated known metastatic disease.  VQ scan showed low probability for PE.  Neurology team has recommended MRI brain to evaluate for stroke or metastatic disease.  Discussed admission with [facility] hospital[facility], Dr. Veverly Fells.   Little, Wenda Overland, MD 09/12/19 1901

## 2019-09-12 NOTE — ED Provider Notes (Signed)
[facility] EMERGENCY DEPARTMENT Provider Note   CSN: [id number] Arrival date & time: 09/12/19  1355     History   Chief Complaint Chief Complaint  Patient presents with  . Code Stroke    HPI BRAELEIGH [name] is a 78 y.o. female.     The history is provided by the patient and medical records. No language interpreter was used.  Neurologic Problem This is a new problem. The current episode started 6 to 12 hours ago. The problem occurs constantly. The problem has been resolved. Associated symptoms include chest pain and shortness of breath. Pertinent negatives include no abdominal pain and no headaches. Nothing aggravates the symptoms. Nothing relieves the symptoms. She has tried nothing for the symptoms. The treatment provided no relief.    Past Medical History:  Diagnosis Date  . Anxiety    hx of  . Asthma   . Breast cancer ([place])    2002; metastatic in 2012, right breast, spread to left hip in 2012  . Cerebrovascular accident [facility])    R thalamic CVA 01/2010  . Chemotherapy induced neutropenia ([place]) 08/15/2017  . Dyslipidemia   . Encounter for therapeutic drug monitoring 04/14/2017  . Goals of care, counseling/discussion 04/19/2017  . Hyperlipemia   . Hypertension   . Low back pain   . Osteoarthritis   . TIA (transient ischemic attack)    x2 in 2011  . Vertigo 2014    Patient Active Problem List   Diagnosis Date Noted  . Weight loss 06/07/2019  . FTT (failure to thrive) in adult 06/07/2019  . Secondary malignant neoplasm of axillary node ([place]) 01/15/2019  . DVT (deep venous thrombosis) ([place]) 12/20/2018  . Hip pain 10/09/2018  . Hand weakness 08/21/2018  . Ingrowing toenail 04/19/2018  . Stress 12/20/2017  . Chemotherapy induced neutropenia ([place]) 08/15/2017  . Goals of care, counseling/discussion 04/19/2017  . Encounter for therapeutic drug monitoring 04/14/2017  . Arm pain, anterior, left 03/08/2017  . Dry skin dermatitis 11/29/2016  .  Diarrhea 11/12/2016  . Left shoulder pain 08/26/2016  . Port catheter in place 07/20/2016  . Rash and nonspecific skin eruption 04/13/2016  . Leg swelling 03/02/2016  . Obese 10/29/2015  . S/P right THA, AA 10/28/2015  . Full code status 08/06/2015  . Knee pain, right 07/28/2015  . Osteoarthritis of right lower extremity 09/17/2014  . Cyst of lateral meniscus 08/27/2014  . Cyst of lateral meniscus of right knee 08/27/2014  . Benign paroxysmal positional vertigo 07/12/2014  . Hypokalemia 07/06/2013  . Knee pain, right anterior 04/04/2013  . Creatinine elevation 04/04/2013  . Dyspnea 05/12/2012  . GERD (gastroesophageal reflux disease) 05/12/2012  . Secondary diabetes mellitus with renal manifestations ([place]) 02/11/2012  . Breast cancer metastasized to bone, right ([place]) 04/23/2011  . Edema 04/23/2011  . Situational anxiety 11/03/2010  . Osteoarthritis 10/21/2010  . Low back pain 10/21/2010  . HIP PAIN 10/07/2010  . NAUSEA AND VOMITING 08/26/2010  . HYPERLIPIDEMIA 02/18/2010  . CEREBROVASCULAR ACCIDENT, HX OF 02/18/2010  . WEIGHT GAIN, ABNORMAL 12/19/2009  . LEG PAIN, RIGHT 04/16/2009  . Vitamin D deficiency 03/07/2009  . Hidradenitis 04/12/2008  . FATIGUE 10/03/2007  . 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 Essential hypertension 10/02/2007  . Asthma 10/02/2007    Past Surgical History:  Procedure Laterality Date  . ABDOMINAL HYSTERECTOMY     complete  . BREAST LUMPECTOMY     Right breast 2002  . JOINT REPLACEMENT  2012   L hip due to breast  ca met  . TOTAL HIP ARTHROPLASTY Right 10/28/2015   Procedure: RIGHT TOTAL HIP ARTHROPLASTY ANTERIOR APPROACH;  Surgeon: Paralee Cancel, MD;  Location: [facility] ORS;  Service: Orthopedics;  Laterality: Right;     OB History   No obstetric history on file.      Home Medications    Prior to Admission medications   Medication Sig Start Date End Date Taking? Authorizing Provider  acetaminophen (TYLENOL) 500 MG tablet Take 500 mg by mouth daily  as needed (arm pain).    [provider]  ALPRAZolam Duanne Moron) 0.5 MG tablet Take 1 tablet (0.5 mg total) by mouth 2 (two) times daily as needed for anxiety or sleep. 03/13/19   Nicholas Lose, MD  amLODipine (NORVASC) 10 MG tablet Take 1 tablet (10 mg total) by mouth daily. 03/13/19   Nicholas Lose, MD  apixaban (ELIQUIS) 5 MG TABS tablet Take 1 tablet (5 mg total) by mouth 2 (two) times daily. Take 10 mg bid for 7 days orally and then decrease to 5 mg bid orally thereafter. 01/11/19   [name], [name], MD  Cholecalciferol (VITAMIN D3) 1000 UNITS CAPS Take 1 capsule by mouth daily.     [provider]  clotrimazole-betamethasone (LOTRISONE) cream Apply 1 application topically 2 (two) times daily. 06/07/19 06/06/20  [name], [name], MD  dronabinol (MARINOL) 2.5 MG capsule Take 1 capsule (2.5 mg total) by mouth 2 (two) times daily before a meal. 08/27/19   Nicholas Lose, MD  gabapentin (NEURONTIN) 100 MG capsule Take 1 capsule (100 mg total) by mouth at bedtime. 01/11/19   [name], [name], MD  hydrochlorothiazide (HYDRODIURIL) 25 MG tablet Take 25 mg by mouth daily. 08/26/18   [provider]  lidocaine-prilocaine (EMLA) cream Apply to affected area once 08/27/19   Nicholas Lose, MD  losartan (COZAAR) 100 MG tablet Take 100 mg by mouth daily. 08/26/18   [provider]  meclizine (ANTIVERT) 12.5 MG tablet Take 1 tablet (12.5 mg total) by mouth 2 (two) times daily as needed for dizziness. 06/29/14   Sciacca, Marissa, PA-C  nystatin (MYCOSTATIN/NYSTOP) powder Apply 1 Dose topically daily. 04/06/18   [provider]  ondansetron (ZOFRAN) 8 MG tablet Take 1 tablet (8 mg total) by mouth every 8 (eight) hours as needed for nausea or vomiting. 07/18/19   Nicholas Lose, MD  pioglitazone (ACTOS) 15 MG tablet Take 1 tablet (15 mg total) by mouth daily. 04/26/19   Nicholas Lose, MD  prochlorperazine (COMPAZINE) 10 MG tablet Take 1 tablet (10 mg total) by mouth every 6  (six) hours as needed (Nausea or vomiting). 08/27/19   Nicholas Lose, MD  Zoledronic Acid (ZOMETA) 4 MG/100ML IVPB Inject 4 mg into the vein every 3 (three) months.    [provider]    Family History Family History  Problem Relation Age of Onset  . Hypertension Mother   . Hypertension Father   . Cancer Neg Hx     Social History Social History   Tobacco Use  . Smoking status: Former Smoker    Packs/day: 1.00    Years: 40.00    Pack years: 40.00    Types: Cigarettes    Quit date: 02/12/2011    Years since quitting: 8.5  . Smokeless tobacco: Never Used  Substance Use Topics  . Alcohol use: Yes    Comment: sometimes daily, some occasional  . Drug use: No     Allergies   Patient has no known allergies.   Review of Systems  Review of Systems  Constitutional: Positive for fatigue. Negative for chills, diaphoresis and fever.  HENT: Negative for congestion.   Eyes: Negative for visual disturbance.  Respiratory: Positive for chest tightness and shortness of breath. Negative for cough and stridor.   Cardiovascular: Positive for chest pain. Negative for palpitations and leg swelling.  Gastrointestinal: Negative for abdominal pain, constipation, diarrhea, nausea and vomiting.  Genitourinary: Negative for flank pain and frequency.  Musculoskeletal: Negative for back pain, neck pain and neck stiffness.  Neurological: Positive for facial asymmetry, weakness and numbness. Negative for dizziness, speech difficulty, light-headedness and headaches.  Psychiatric/Behavioral: Negative for agitation.  All other systems reviewed and are negative.    Physical Exam Updated Vital Signs BP 136/74   Pulse 98   Temp 97.7 F (36.5 C) (Oral)   Resp 20   Wt 69.5 kg   SpO2 100%   BMI 27.14 kg/m   Physical Exam Vitals signs and nursing note reviewed.  Constitutional:      General: She is not in acute distress.    Appearance: Normal appearance. She is well-developed. She is not  ill-appearing, toxic-appearing or diaphoretic.  HENT:     Head: Normocephalic and atraumatic.     Right Ear: External ear normal.     Left Ear: External ear normal.     Nose: Nose normal.     Mouth/Throat:     Pharynx: No oropharyngeal exudate.  Eyes:     Conjunctiva/sclera: Conjunctivae normal.     Pupils: Pupils are equal, round, and reactive to light.  Neck:     Musculoskeletal: Normal range of motion and neck supple.  Cardiovascular:     Rate and Rhythm: Normal rate.     Pulses: Normal pulses.     Heart sounds: No murmur.  Pulmonary:     Effort: No respiratory distress.     Breath sounds: No stridor. Rhonchi present. No wheezing or rales.  Chest:     Chest wall: No tenderness.  Abdominal:     General: Abdomen is flat. There is no distension.     Tenderness: There is no abdominal tenderness. There is no rebound.  Skin:    General: Skin is warm.     Capillary Refill: Capillary refill takes less than 2 seconds.     Findings: No erythema or rash.  Neurological:     Mental Status: She is alert and oriented to person, place, and time.     GCS: GCS eye subscore is 4. GCS verbal subscore is 5. GCS motor subscore is 6.     Cranial Nerves: No cranial nerve deficit, dysarthria or facial asymmetry.     Sensory: Sensory deficit (numbness in legs bilaterally. numbness in R arm at baseline) present.     Motor: No weakness, tremor or abnormal muscle tone.     Coordination: Finger-Nose-Finger Test normal.     Deep Tendon Reflexes: Reflexes are normal and symmetric.     Comments: Numbness in both legs present.  Numbness in right arm compared to left which she reports is nonacute.  No facial droop.  Clear speech.  Pupils symmetric and reactive with normal extraocular movements.  No focal neurologic deficits change from her baseline on my exam.  Psychiatric:        Mood and Affect: Mood normal.      ED Treatments / Results  Labs (all labs ordered are listed, but only abnormal results  are displayed) Labs Reviewed  CBC - Abnormal; Notable for the following components:  Result Value   Hemoglobin 11.5 (*)    RDW 17.2 (*)    All other components within normal limits  DIFFERENTIAL - Abnormal; Notable for the following components:   Monocytes Absolute 1.3 (*)    All other components within normal limits  COMPREHENSIVE METABOLIC PANEL - Abnormal; Notable for the following components:   CO2 16 (*)    Glucose, Bld 113 (*)    Creatinine, Ser 2.36 (*)    Albumin 2.9 (*)    AST 76 (*)    Alkaline Phosphatase 141 (*)    GFR calc non Af Amer 19 (*)    GFR calc Af Amer 22 (*)    Anion gap 17 (*)    All other components within normal limits  BRAIN NATRIURETIC PEPTIDE - Abnormal; Notable for the following components:   B Natriuretic Peptide 130.3 (*)    All other components within normal limits  I-STAT CHEM 8, ED - Abnormal; Notable for the following components:   BUN 27 (*)    Creatinine, Ser 2.10 (*)    Glucose, Bld 106 (*)    Calcium, Ion 1.11 (*)    TCO2 17 (*)    All other components within normal limits  TROPONIN I (HIGH SENSITIVITY) - Abnormal; Notable for the following components:   Troponin I (High Sensitivity) 19 (*)    All other components within normal limits  PROTIME-INR  APTT  CBG MONITORING, ED  TROPONIN I (HIGH SENSITIVITY)    EKG EKG Interpretation  Date/Time:  Wednesday September 12 2019 14:24:12 EST Ventricular Rate:  103 PR Interval:    QRS Duration: 89 QT Interval:  335 QTC Calculation: 439 R Axis:   15 Text Interpretation: Sinus tachycardia Probable left atrial enlargement Low voltage, extremity leads Nonspecific T abnormalities, anterior leads When compared to prior, new t wave inversion in lead. V3. No STEMI Confirmed by Antony Blackbird [phone]) on 09/12/2019 3:27:46 PM   Radiology Dg Chest Portable 1 View  Result Date: 09/12/2019 CLINICAL DATA:  Chest pain short of breath.  Breast cancer. EXAM: PORTABLE CHEST 1 VIEW COMPARISON:   06/29/2014.  Chest CT 06/10/2019 FINDINGS: Mild bibasilar airspace disease left greater than right. Small bilateral effusions. Small densities left upper lobe and left hilum. Possible small density right upper lobe. These are compatible with metastatic deposits as seen on recent CT. Port-A-Cath tip in the lower SVC. IMPRESSION: Bibasilar airspace disease and small pleural effusions. Possible pneumonia or malignant effusion. Metastatic disease in the lungs. Electronically Signed   By: Franchot Gallo M.D.   On: 09/12/2019 15:50   Ct Head Code Stroke Wo Contrast  Result Date: 09/12/2019 CLINICAL DATA:  Code stroke.  Left facial droop.  Stroke EXAM: CT HEAD WITHOUT CONTRAST TECHNIQUE: Contiguous axial images were obtained from the base of the skull through the vertex without intravenous contrast. COMPARISON:  CT head 06/29/2014 FINDINGS: Brain: Mild atrophy which has progressed in the interval. Negative for hydrocephalus. Small hypodensity right pons compatible with chronic infarct is unchanged. Negative for acute infarct.  Negative for hemorrhage or mass. Vascular: Negative for hyperdense vessel Skull: Negative Sinuses/Orbits: Air-fluid level sphenoid sinus. Remaining sinuses clear. Negative orbit Other: None ASPECTS ([place] Stroke Program Early CT Score) - Ganglionic level infarction (caudate, lentiform nuclei, internal capsule, insula, M1-M3 cortex): 7 - Supraganglionic infarction (M4-M6 cortex): 3 Total score (0-10 with 10 being normal): 10 IMPRESSION: 1. No acute intracranial abnormality. 2. ASPECTS is 10 Electronically Signed   By: Franchot Gallo M.D.   On:  09/12/2019 14:33    Procedures Procedures (including critical care time)  Medications Ordered in ED Medications  sodium chloride flush (NS) 0.9 % injection 3 mL (has no administration in time range)  LORazepam (ATIVAN) injection 0.25 mg (has no administration in time range)  technetium albumin aggregated (MAA) injection solution 1.5 millicurie  (1.5 millicuries Intravenous Contrast Given 09/12/19 1719)     Initial Impression / Assessment and Plan / ED Course  I have reviewed the triage vital signs and the nursing notes.  Pertinent labs & imaging results that were available during my care of the patient were reviewed by me and considered in my medical decision making (see chart for details).        [name] [name] is a 78 y.o. female with a past medical history significant for breast cancer with metastasis diffusely, diabetes, hypertension, hyperlipidemia, asthma, prior stroke, vertigo, and obesity who presents as a code stroke.  Patient reportedly has had right arm and right leg numbness/weakness for "months" and then has had left leg weakness for 1 month.  The reason she came in was because today she noted she was having right-sided facial droop and numbness that has resolved by the time she came to the emergency department.  Patient and family are also concerned that she has been having worsening chest pain or shortness of breath for the last few days.  She reports an elephant is sitting on her chest as a 7 out of 10 in severity.  She reports that she has not taken her Eliquis and weeks and she did not realize that was for blood clots.  She reports the chest discomfort is worsened with deep breathing.  She reports that she is still getting infusion therapy for her cancer.  She denies new headache or neck pain.  She reports the facial symptoms have improved.  On my initial exam, patient's lungs are coarse bilaterally.  Chest and back nontender.  Abdomen is nontender.  Patient moving all extremities.  Patient has no weakness on my exam but has numbness in her right arm and right leg which she reports has been there for months.  Pupils are symmetric and facial is symmetric.  Normal sensation in the face.  Clear speech.  Initial EKG shows no STEMI and shows sinus tachycardia.  Neurology canceled the code stroke and after the patient had a  reassuring CT scan, do not feel she had a stroke or TIA.  They are more concerned about the patient's chest pain, shortness of breath, tachycardia, and tachypnea as she is off of her blood thinners.  After my evaluation, I am also concerned about possible PE as a cause of her constellation of symptoms at this time.  Patient's stroke labs began to return and she was found to have elevated kidney function and is not a candidate for a PE study at this time.  Will add on troponin, chest x-ray, and a VQ scan to further evaluate.  Anticipate she will need admission if she is found to have a PE.   Care will be transferred to oncoming team for further management and monitoring.  Final Clinical Impressions(s) / ED Diagnoses   Final diagnoses:  Chest pain, unspecified type  Shortness of breath  Tachycardia  Tachypnea     Clinical Impression: 1. Chest pain, unspecified type   2. Shortness of breath   3. Tachycardia   4. Tachypnea     Disposition: Care transferred to oncoming team while awaiting for results of CT  chest, VQ scan, troponin, BNP, and further work-up.  Anticipate admission.   This note was prepared with assistance of Systems analyst. Occasional wrong-word or sound-a-like substitutions may have occurred due to the inherent limitations of voice recognition software.      Tegeler, Gwenyth Allegra, MD 09/12/19 1810

## 2019-09-12 NOTE — Telephone Encounter (Signed)
Pt reports no BM since last Tuesday.  Pt denies any abdominal pain, but notices some distention across lower abdominal area.  Pt is able to pass gas, drinking 4-5 bottles of water daily.    RN educated patient to take milk of magnesium, pt reports she does have this in her house.  RN educated on use of stool softeners for preventive measures, along with increased ambulation.   RN voiced understanding and agreement.  Pt will call later this afternoon.

## 2019-09-12 NOTE — ED Notes (Signed)
Pt placed on bedpan

## 2019-09-12 NOTE — ED Triage Notes (Signed)
Pt presents to the ed as code stroke. Pt with R facial weakness and numbness and L sided leg went numb. Onset symptoms 1300. Hx tia. CBG 127, 116/56, HR 120 ST, RR 24, T 97.50F.

## 2019-09-12 NOTE — ED Notes (Signed)
Pt transported to scan 

## 2019-09-12 NOTE — Consult Note (Signed)
Reason for Consult: code stroke Referring Physician: Gerald Stabs Tegeler [name] [name] is an 78 y.o. female.  HPI: Ms. Pharris is a 78 year old African-American lady with past medical history of recurrent metastatic breast cancer x3 since 1995 status post chemotherapy, hypertension, hyperlipidemia, osteoarthritis and previous history of TIA x2 in 2011 who presented with sudden onset of right face and arm numbness as well as left leg numbness at 1 PM today.  The patient is a poor historian and initially states symptoms began suddenly at 1:00 but states that she has had similar numbness off and on for the last couple of weeks.  She is also been complaining of some back pain and left leg weakness.  She feels her symptoms may be improving.  She appears quite anxious upon arrival and is having some shortness of breath.  She states she was supposed to be on Eliquis but stopped it a few weeks ago as she was having constipation and blamed it for it.  She denies any slurred speech, headache, vision loss.  When I examined upon arrival she did not have any facial droop or hand weakness but had subjective numbness in the right upper extremity only. NIH stroke scale on exam 1 Baseline modified Rankin 1 IV TPA given no due to minimum and improving deficits and symptomatology not likely compatible with stroke Past Medical History:  Diagnosis Date  . Anxiety    hx of  . Asthma   . Breast cancer ([place])    2002; metastatic in 2012, right breast, spread to left hip in 2012  . Cerebrovascular accident [facility])    R thalamic CVA 01/2010  . Chemotherapy induced neutropenia ([place]) 08/15/2017  . Dyslipidemia   . Encounter for therapeutic drug monitoring 04/14/2017  . Goals of care, counseling/discussion 04/19/2017  . Hyperlipemia   . Hypertension   . Low back pain   . Osteoarthritis   . TIA (transient ischemic attack)    x2 in 2011  . Vertigo 2014    Past Surgical History:  Procedure Laterality Date  . ABDOMINAL  HYSTERECTOMY     complete  . BREAST LUMPECTOMY     Right breast 2002  . JOINT REPLACEMENT  2012   L hip due to breast ca met  . TOTAL HIP ARTHROPLASTY Right 10/28/2015   Procedure: RIGHT TOTAL HIP ARTHROPLASTY ANTERIOR APPROACH;  Surgeon: Paralee Cancel, MD;  Location: [facility];  Service: Orthopedics;  Laterality: Right;    Family History  Problem Relation Age of Onset  . Hypertension Mother   . Hypertension Father   . Cancer Neg Hx     Social History:  reports that she quit smoking about 8 years ago. Her smoking use included cigarettes. She has a 40.00 pack-year smoking history. She has never used smokeless tobacco. She reports current alcohol use. She reports that she does not use drugs.  Allergies: No Known Allergies  Medications: I have reviewed the patient's current medications.  Results for orders placed or performed during the hospital encounter of 09/12/19 (from the past 48 hour(s))  Protime-INR     Status: None   Collection Time: 09/12/19  1:58 PM  Result Value Ref Range   Prothrombin Time 15.0 11.4 - 15.2 seconds   INR 1.2 0.8 - 1.2    Comment: (NOTE) INR goal varies based on device and disease states. Performed at [facility], [place] [address] [place], [place] [address]   APTT     Status: None   Collection Time: 09/12/19  1:58 PM  Result Value Ref Range   aPTT 26 24 - 36 seconds    Comment: Performed at [facility], [place] [address], [place], [place] [address]  CBC     Status: Abnormal   Collection Time: 09/12/19  1:58 PM  Result Value Ref Range   WBC 9.2 4.0 - 10.5 K/uL   RBC 4.01 3.87 - 5.11 MIL/uL   Hemoglobin 11.5 (L) 12.0 - 15.0 g/dL   HCT 36.6 36.0 - 46.0 %   MCV 91.3 80.0 - 100.0 fL   MCH 28.7 26.0 - 34.0 pg   MCHC 31.4 30.0 - 36.0 g/dL   RDW 17.2 (H) 11.5 - 15.5 %   Platelets 301 150 - 400 K/uL   nRBC 0.0 0.0 - 0.2 %    Comment: Performed at [facility], [place] [address] [place], [place] [address]  Differential     Status: Abnormal    Collection Time: 09/12/19  1:58 PM  Result Value Ref Range   Neutrophils Relative % 62 %   Neutro Abs 5.8 1.7 - 7.7 K/uL   Lymphocytes Relative 21 %   Lymphs Abs 1.9 0.7 - 4.0 K/uL   Monocytes Relative 14 %   Monocytes Absolute 1.3 (H) 0.1 - 1.0 K/uL   Eosinophils Relative 1 %   Eosinophils Absolute 0.1 0.0 - 0.5 K/uL   Basophils Relative 1 %   Basophils Absolute 0.1 0.0 - 0.1 K/uL   Immature Granulocytes 1 %   Abs Immature Granulocytes 0.06 0.00 - 0.07 K/uL    Comment: Performed at [place] [address] [place], [place] [address]  Comprehensive metabolic panel     Status: Abnormal   Collection Time: 09/12/19  1:58 PM  Result Value Ref Range   Sodium 139 135 - 145 mmol/L   Potassium 4.0 3.5 - 5.1 mmol/L   Chloride 106 98 - 111 mmol/L   CO2 16 (L) 22 - 32 mmol/L   Glucose, Bld 113 (H) 70 - 99 mg/dL   BUN 22 8 - 23 mg/dL   Creatinine, Ser 2.36 (H) 0.44 - 1.00 mg/dL   Calcium 9.2 8.9 - 10.3 mg/dL   Total Protein 7.9 6.5 - 8.1 g/dL   Albumin 2.9 (L) 3.5 - 5.0 g/dL   AST 76 (H) 15 - 41 U/L   ALT 17 0 - 44 U/L   Alkaline Phosphatase 141 (H) 38 - 126 U/L   Total Bilirubin 1.0 0.3 - 1.2 mg/dL   GFR calc non Af Amer 19 (L) >60 mL/min   GFR calc Af Amer 22 (L) >60 mL/min   Anion gap 17 (H) 5 - 15    Comment: Performed at [facility], [place] [address] [place], [place] [address]  I-stat chem 8, ED     Status: Abnormal   Collection Time: 09/12/19  2:04 PM  Result Value Ref Range   Sodium 139 135 - 145 mmol/L   Potassium 4.0 3.5 - 5.1 mmol/L   Chloride 107 98 - 111 mmol/L   BUN 27 (H) 8 - 23 mg/dL   Creatinine, Ser 2.10 (H) 0.44 - 1.00 mg/dL   Glucose, Bld 106 (H) 70 - 99 mg/dL   Calcium, Ion 1.11 (L) 1.15 - 1.40 mmol/L   TCO2 17 (L) 22 - 32 mmol/L   Hemoglobin 12.9 12.0 - 15.0 g/dL   HCT 38.0 36.0 - 46.0 %    Ct Head Code Stroke Wo Contrast  Result  Date: 09/12/2019 CLINICAL DATA:  Code stroke.  Left facial droop.  Stroke EXAM: CT HEAD WITHOUT CONTRAST  TECHNIQUE: Contiguous axial images were obtained from the base of the skull through the vertex without intravenous contrast. COMPARISON:  CT head 06/29/2014 FINDINGS: Brain: Mild atrophy which has progressed in the interval. Negative for hydrocephalus. Small hypodensity right pons compatible with chronic infarct is unchanged. Negative for acute infarct.  Negative for hemorrhage or mass. Vascular: Negative for hyperdense vessel Skull: Negative Sinuses/Orbits: Air-fluid level sphenoid sinus. Remaining sinuses clear. Negative orbit Other: None ASPECTS ([place] Stroke Program Early CT Score) - Ganglionic level infarction (caudate, lentiform nuclei, internal capsule, insula, M1-M3 cortex): 7 - Supraganglionic infarction (M4-M6 cortex): 3 Total score (0-10 with 10 being normal): 10 IMPRESSION: 1. No acute intracranial abnormality. 2. ASPECTS is 10 Electronically Signed   By: Franchot Gallo M.D.   On: 09/12/2019 14:33     MRI examination of the brain. pending      ROS 14 system review of systems is positive only for symptoms stated above in history of presenting illness.  Blood pressure 117/81, pulse 99, temperature 97.7 F (36.5 C), temperature source Oral, resp. rate (!) 28, weight 69.5 kg, SpO2 98 %. Physical Exam Obese elderly African-American lady who appears anxious and in mild respiratory distress.  She has a IV port in the left infra clavicular region for chemotherapy. . Afebrile. Head is nontraumatic. Neck is supple without bruit.    Cardiac exam no murmur or gallop. Lungs are clear to auscultation. Distal pulses are well felt.  Neurological Exam :  She is awake alert she is oriented to time place and person.  She appears anxious.  There is no aphasia apraxia or dysarthria.  She follows commands well.  Extraocular movements are full range without nystagmus.  She blinks to threat bilaterally.  Fundi were not visualized but vision acuity appears adequate.  There is no facial asymmetry or weakness.   Tongue midline.  Motor system exam patient's effort is poor and variable but there does not appear to be upper or lower extremity drift.  She is able to move all 4 extremities against gravity but is not able to sustain it for long.  Deep tendon reflexes are symmetric plantars downgoing.  Sensation subjective diminished sensation in the right upper extremity only.  Normal sensation in the face and lower extremities.  Plantars downgoing.  Gait not tested.  Assessment/Plan: 78 year old lady presenting with sudden onset of right upper extremity and face paresthesias along with left lower extremity weakness all of which appear to be improving and are in a pattern and distribution which are not highly suggestive of acute stroke.  CT scan of the head shows no acute abnormality.  She also has some shortness of breath and chest x-ray is suggestive of bibasilar airspace disease, small pleural effusions and possible pneumonia or malignant effusion.  Given history of recurrent cancer she will need appropriate evaluation and treatment of this by ER physician.  Consider MRI scan of the brain with and without contrast when patient is stable from medical standpoint and able to lie flat for 20 to 30 minutes to look for brain metastasis alternative explanation for her numbness. Resume Eliquis for chronic anticoagulation and patient counseled to be compliant with it. No further acute stroke work-up is necessary but in case of further neurological worsening will be happy to reevaluate the patient. Discussed with patient and her grandson at the bedside and answered questions. Discussed with Dr. Sherry Ruffing Greater than  50% time during this 80-minute consultation visit was spent on counseling and coordination of care about TIA and stroke and numbness and discussion with care team and answered questions Antony Contras 09/12/2019, 3:49 PM    Note: This document was prepared with digital dictation and possible smart phrase  technology. Any transcriptional errors that result from this process are unintentional.

## 2019-09-13 ENCOUNTER — Other Ambulatory Visit: Payer: Medicare HMO

## 2019-09-13 ENCOUNTER — Observation Stay (HOSPITAL_COMMUNITY): Payer: Medicare HMO

## 2019-09-13 ENCOUNTER — Ambulatory Visit: Payer: Medicare HMO | Admitting: Hematology and Oncology

## 2019-09-13 ENCOUNTER — Other Ambulatory Visit: Payer: Self-pay

## 2019-09-13 ENCOUNTER — Ambulatory Visit: Payer: Medicare HMO

## 2019-09-13 ENCOUNTER — Inpatient Hospital Stay (HOSPITAL_COMMUNITY): Payer: Medicare HMO

## 2019-09-13 ENCOUNTER — Encounter (HOSPITAL_COMMUNITY): Payer: Self-pay | Admitting: General Practice

## 2019-09-13 DIAGNOSIS — R1310 Dysphagia, unspecified: Secondary | ICD-10-CM | POA: Diagnosis not present

## 2019-09-13 DIAGNOSIS — R200 Anesthesia of skin: Secondary | ICD-10-CM | POA: Diagnosis not present

## 2019-09-13 DIAGNOSIS — Z7901 Long term (current) use of anticoagulants: Secondary | ICD-10-CM | POA: Diagnosis not present

## 2019-09-13 DIAGNOSIS — C7951 Secondary malignant neoplasm of bone: Secondary | ICD-10-CM | POA: Diagnosis present

## 2019-09-13 DIAGNOSIS — C7972 Secondary malignant neoplasm of left adrenal gland: Secondary | ICD-10-CM | POA: Diagnosis present

## 2019-09-13 DIAGNOSIS — M1990 Unspecified osteoarthritis, unspecified site: Secondary | ICD-10-CM | POA: Diagnosis present

## 2019-09-13 DIAGNOSIS — K5909 Other constipation: Secondary | ICD-10-CM | POA: Diagnosis present

## 2019-09-13 DIAGNOSIS — R0602 Shortness of breath: Secondary | ICD-10-CM

## 2019-09-13 DIAGNOSIS — Z6827 Body mass index (BMI) 27.0-27.9, adult: Secondary | ICD-10-CM | POA: Diagnosis not present

## 2019-09-13 DIAGNOSIS — C50911 Malignant neoplasm of unspecified site of right female breast: Secondary | ICD-10-CM | POA: Diagnosis present

## 2019-09-13 DIAGNOSIS — R079 Chest pain, unspecified: Secondary | ICD-10-CM | POA: Diagnosis not present

## 2019-09-13 DIAGNOSIS — K222 Esophageal obstruction: Secondary | ICD-10-CM | POA: Diagnosis present

## 2019-09-13 DIAGNOSIS — F418 Other specified anxiety disorders: Secondary | ICD-10-CM | POA: Diagnosis not present

## 2019-09-13 DIAGNOSIS — I10 Essential (primary) hypertension: Secondary | ICD-10-CM | POA: Diagnosis not present

## 2019-09-13 DIAGNOSIS — Q394 Esophageal web: Secondary | ICD-10-CM | POA: Diagnosis not present

## 2019-09-13 DIAGNOSIS — N189 Chronic kidney disease, unspecified: Secondary | ICD-10-CM | POA: Diagnosis not present

## 2019-09-13 DIAGNOSIS — J449 Chronic obstructive pulmonary disease, unspecified: Secondary | ICD-10-CM | POA: Diagnosis present

## 2019-09-13 DIAGNOSIS — E785 Hyperlipidemia, unspecified: Secondary | ICD-10-CM | POA: Diagnosis present

## 2019-09-13 DIAGNOSIS — I129 Hypertensive chronic kidney disease with stage 1 through stage 4 chronic kidney disease, or unspecified chronic kidney disease: Secondary | ICD-10-CM | POA: Diagnosis present

## 2019-09-13 DIAGNOSIS — K228 Other specified diseases of esophagus: Secondary | ICD-10-CM | POA: Diagnosis not present

## 2019-09-13 DIAGNOSIS — N179 Acute kidney failure, unspecified: Secondary | ICD-10-CM | POA: Diagnosis present

## 2019-09-13 DIAGNOSIS — F419 Anxiety disorder, unspecified: Secondary | ICD-10-CM | POA: Diagnosis present

## 2019-09-13 DIAGNOSIS — Z8673 Personal history of transient ischemic attack (TIA), and cerebral infarction without residual deficits: Secondary | ICD-10-CM | POA: Diagnosis not present

## 2019-09-13 DIAGNOSIS — Z20828 Contact with and (suspected) exposure to other viral communicable diseases: Secondary | ICD-10-CM | POA: Diagnosis present

## 2019-09-13 DIAGNOSIS — I82721 Chronic embolism and thrombosis of deep veins of right upper extremity: Secondary | ICD-10-CM | POA: Diagnosis present

## 2019-09-13 DIAGNOSIS — E43 Unspecified severe protein-calorie malnutrition: Secondary | ICD-10-CM | POA: Diagnosis present

## 2019-09-13 DIAGNOSIS — R29810 Facial weakness: Secondary | ICD-10-CM | POA: Diagnosis present

## 2019-09-13 DIAGNOSIS — C787 Secondary malignant neoplasm of liver and intrahepatic bile duct: Secondary | ICD-10-CM | POA: Diagnosis present

## 2019-09-13 DIAGNOSIS — J910 Malignant pleural effusion: Secondary | ICD-10-CM | POA: Diagnosis present

## 2019-09-13 DIAGNOSIS — C7800 Secondary malignant neoplasm of unspecified lung: Secondary | ICD-10-CM | POA: Diagnosis present

## 2019-09-13 DIAGNOSIS — N184 Chronic kidney disease, stage 4 (severe): Secondary | ICD-10-CM | POA: Diagnosis present

## 2019-09-13 LAB — BASIC METABOLIC PANEL
Anion gap: 14 (ref 5–15)
BUN: 24 mg/dL — ABNORMAL HIGH (ref 8–23)
CO2: 20 mmol/L — ABNORMAL LOW (ref 22–32)
Calcium: 9.2 mg/dL (ref 8.9–10.3)
Chloride: 103 mmol/L (ref 98–111)
Creatinine, Ser: 2.32 mg/dL — ABNORMAL HIGH (ref 0.44–1.00)
GFR calc Af Amer: 23 mL/min — ABNORMAL LOW (ref >60)
GFR calc non Af Amer: 20 mL/min — ABNORMAL LOW (ref >60)
Glucose, Bld: 110 mg/dL — ABNORMAL HIGH (ref 70–99)
Potassium: 4.2 mmol/L (ref 3.5–5.1)
Sodium: 137 mmol/L (ref 135–145)

## 2019-09-13 LAB — CBC
HCT: 34.1 % — ABNORMAL LOW (ref 36.0–46.0)
Hemoglobin: 10.8 g/dL — ABNORMAL LOW (ref 12.0–15.0)
MCH: 28.6 pg (ref 26.0–34.0)
MCHC: 31.7 g/dL (ref 30.0–36.0)
MCV: 90.2 fL (ref 80.0–100.0)
Platelets: 277 10*3/uL (ref 150–400)
RBC: 3.78 MIL/uL — ABNORMAL LOW (ref 3.87–5.11)
RDW: 17.2 % — ABNORMAL HIGH (ref 11.5–15.5)
WBC: 9.3 10*3/uL (ref 4.0–10.5)
nRBC: 0 % (ref 0.0–0.2)

## 2019-09-13 LAB — SARS CORONAVIRUS 2 (TAT 6-24 HRS): SARS Coronavirus 2: NEGATIVE

## 2019-09-13 LAB — ECHOCARDIOGRAM COMPLETE: Weight: 2451.52 oz

## 2019-09-13 MED ORDER — CHLORHEXIDINE GLUCONATE CLOTH 2 % EX PADS
6.0000 | MEDICATED_PAD | Freq: Every day | CUTANEOUS | Status: DC
  Administered 2019-09-13 – 2019-09-22 (×9): 6 via TOPICAL

## 2019-09-13 MED ORDER — FUROSEMIDE 10 MG/ML IJ SOLN
20.0000 mg | Freq: Two times a day (BID) | INTRAMUSCULAR | Status: AC
  Administered 2019-09-14: 08:00:00 20 mg via INTRAVENOUS
  Filled 2019-09-13 (×2): qty 4

## 2019-09-13 MED ORDER — ENSURE ENLIVE PO LIQD
237.0000 mL | Freq: Two times a day (BID) | ORAL | Status: DC
  Administered 2019-09-13: 16:00:00 237 mL via ORAL

## 2019-09-13 NOTE — Evaluation (Signed)
Physical Therapy Evaluation Patient Details Name: [name] [name] MRN: [id number] DOB: [birth year] Today's Date: 09/13/2019   History of Present Illness  [name] [name] is a 78 y.o. female with medical history significant of cancer in 2002 treated successfully.  She now with a recurrence with widely metastatic disease involving liver lungs bone.  She is still undergoing treatment for her cancer and had a DVT in the R UE.  She presented with R facial droop and weakness over couple of weeks and now with some speech and swallowing issues.  Clinical Impression  Patient presents with decreased independence with mobility due to decreased activity tolerance, decreased strength LLE>RLE and decreased balance.  She will need skilled PT in the acute setting to allow return home with family support (24 hour x 2 weeks) and follow up HHPT.     Follow Up Recommendations Home health PT;Supervision/Assistance - 24 hour    Equipment Recommendations  None recommended by PT    Recommendations for Other Services       Precautions / Restrictions Precautions Precautions: Fall Restrictions Weight Bearing Restrictions: No      Mobility  Bed Mobility Overal bed mobility: Needs Assistance Bed Mobility: Supine to Sit     Supine to sit: Min guard;HOB elevated     General bed mobility comments: cues for hand placement, assist for balance; to supine with S, grandson assisted with +2 scoot up in bed  Transfers Overall transfer level: Needs assistance Equipment used: Rolling walker (2 wheeled) Transfers: Sit to/from Stand Sit to Stand: Min assist;Min guard         General transfer comment: completed 5 transfers total for sit to stand with minguard overall for safety; uncontrolled descent when fatigued  Ambulation/Gait Ambulation/Gait assistance: Min guard Gait Distance (Feet): 20 Feet Assistive device: Rolling walker (2 wheeled) Gait Pattern/deviations: Step-through pattern;Step-to  pattern;Decreased stride length     General Gait Details: fatigued quickly with SOB and needing seated rest, refused second walk due to fatigue; so performed STS x 3 for SpO2 check which was 100% on RA  Stairs            Wheelchair Mobility    Modified Rankin (Stroke Patients Only)       Balance Overall balance assessment: Needs assistance Sitting-balance support: Feet supported Sitting balance-Leahy Scale: Good     Standing balance support: Bilateral upper extremity supported Standing balance-Leahy Scale: Poor Standing balance comment: UE support for balance                             Pertinent Vitals/Pain Pain Assessment: Faces Faces Pain Scale: Hurts little more Pain Location: R UE with movement Pain Descriptors / Indicators: Grimacing;Sore Pain Intervention(s): Monitored during session;Repositioned    Home Living Family/patient expects to be discharged to:: Private residence Living Arrangements: Alone Available Help at Discharge: Family;Available 24 hours/day Type of Home: House Home Access: Level entry     Home Layout: One level Home Equipment: Tub bench;Bedside commode;Walker - 2 wheels;Walker - 4 wheels;Wheelchair - manual      Prior Function Level of Independence: Independent with assistive device(s);Needs assistance      ADL's / Homemaking Assistance Needed: intermittent housekeeping help, has meals delivered  Comments: uses rollator at home     Hand Dominance   Dominant Hand: Right    Extremity/Trunk Assessment   Upper Extremity Assessment Upper Extremity Assessment: RUE deficits/detail;LUE deficits/detail RUE Deficits / Details: edema throughout the UE; states she  does have a sleeve at home; strength shoulder flexion 2/5, elbow flexion 3+/5, extension 2+/5, weak grip RUE Sensation: decreased light touch RUE Coordination: decreased fine motor LUE Deficits / Details: AROM WFL, strength grossly 4/5    Lower Extremity  Assessment Lower Extremity Assessment: LLE deficits/detail;RLE deficits/detail RLE Deficits / Details: AROM WFL, strength hip flexion 3+/5, knee extension 4+/5, ankle DF 4/5 LLE Deficits / Details: AROM WFL, strength hip flexion 3/5, knee extension 4/5, ankle DF 4/5    Cervical / Trunk Assessment Cervical / Trunk Assessment: Kyphotic  Communication   Communication: No difficulties  Cognition Arousal/Alertness: Awake/alert Behavior During Therapy: WFL for tasks assessed/performed Overall Cognitive Status: Within Functional Limits for tasks assessed                                        General Comments General comments (skin integrity, edema, etc.): grandson in the room, he and pt report family coming in and will be available 24/7 over the next two weeks    Exercises     Assessment/Plan    PT Assessment Patient needs continued PT services  PT Problem List Decreased strength;Decreased activity tolerance;Cardiopulmonary status limiting activity;Decreased knowledge of precautions;Decreased knowledge of use of DME;Decreased balance       PT Treatment Interventions DME instruction;Stair training;Therapeutic activities;Balance training;Therapeutic exercise;Functional mobility training;Gait training;Patient/family education    PT Goals (Current goals can be found in the Care Plan section)  Acute Rehab PT Goals Patient Stated Goal: to go home, get stronger PT Goal Formulation: With patient Time For Goal Achievement: 09/27/19 Potential to Achieve Goals: Good    Frequency Min 3X/week   Barriers to discharge        Co-evaluation               AM-PAC PT "6 Clicks" Mobility  Outcome Measure Help needed turning from your back to your side while in a flat bed without using bedrails?: None Help needed moving from lying on your back to sitting on the side of a flat bed without using bedrails?: A Little Help needed moving to and from a bed to a chair (including a  wheelchair)?: A Little Help needed standing up from a chair using your arms (e.g., wheelchair or bedside chair)?: A Little Help needed to walk in hospital room?: A Little Help needed climbing 3-5 steps with a railing? : A Little 6 Click Score: 19    End of Session Equipment Utilized During Treatment: Gait belt Activity Tolerance: Patient limited by fatigue Patient left: in bed;with call bell/phone within reach;with bed alarm set   PT Visit Diagnosis: Muscle weakness (generalized) (M62.81);Difficulty in walking, not elsewhere classified (R26.2)    Time: 8338-2505 PT Time Calculation (min) (ACUTE ONLY): 31 min   Charges:   PT Evaluation $PT Eval Moderate Complexity: 1 Mod PT Treatments $Gait Training: 8-22 mins        Magda Kiel, [place] [facility] [phone] 09/13/2019   Reginia Naas 09/13/2019, 3:29 PM

## 2019-09-13 NOTE — Progress Notes (Signed)
Pt arrived unit safely from ED

## 2019-09-13 NOTE — ED Notes (Signed)
Verbal order to stop IV fluid from Dr. Broadus John

## 2019-09-13 NOTE — Progress Notes (Signed)
SLP Cancellation Note  Patient Details Name: DHAMAR GREGORY MRN: [id number] DOB: July 07, 1941   Cancelled treatment:       Reason Eval/Treat Not Completed: Patient at procedure or test/unavailable  Attempted bedside swallow evaluation. Pt currently having in room procedure. Will continue efforts.  Carmela Rima, [place] Speech Language Pathologist Office: [phone] Pager: [phone]  Shonna Chock 09/13/2019, 2:45 PM

## 2019-09-13 NOTE — ED Notes (Signed)
Patient transported to MRI 

## 2019-09-13 NOTE — Progress Notes (Signed)
  Echocardiogram 2D Echocardiogram has been performed.  [name] [name] 09/13/2019, 3:07 PM

## 2019-09-13 NOTE — Patient Outreach (Signed)
[facility]) Care Management  09/13/2019  [name] [name] 1941/08/25 [id number]   Patient noted to now be inpatient status.  RN CM will follow hospitalization course and follow up when appropriate.  Jone Baseman, RN, MSN [facility] Care Management Coordinator Direct Line [phone] Cell [phone] Toll Free: [phone]  Fax: [phone]

## 2019-09-13 NOTE — Progress Notes (Addendum)
PROGRESS NOTE    [name] [name]  SWN:[id number] DOB: 1941/04/06 DOA: 09/12/2019 PCP: Cassandria Anger, MD  Brief Narrative: 78 year old female with history of recurrent widely metastatic breast cancer on chemotherapy, with metastasis to lungs, bones and liver,, history of palliative radiation to the neck and subclavian area, chronic right arm weakness and stiffness, hypertension, dyslipidemia, osteoarthritis, history of DVT on Eliquis presented to the emergency room yesterday with sudden onset right face numbness, and some left leg numbness, patient has numerous complaints also reports ongoing dyspnea on exertion, chronic back pain, some difficulty swallowing as well. -Her last chemotherapy with Beryle Flock was on 11/3   Assessment & Plan:  Transient right facial weakness, left leg weakness -Appears to have resolved, -Etiology unclear, poor historian, unlikely to be TIA -MRI negative for acute ischemic events or metastasis -PT OT, SLP evaluation -2D echocardiogram -Resumed apixaban, she was originally on this for right arm DVT, stopped this recently due to constipation, now restarted  Bilateral pleural effusion, dyspnea on exertion -Could be malignant effusions, has widely metastatic pulmonary disease -Diurese with IV Lasix today, check 2D echocardiogram  Recurrent widely metastatic breast cancer -Prognosis is poor, anticipate she will need a goals of care discussion in the near future, followed by Dr. Lindi Adie, at this time insists on full code and full scope of treatment -Receiving palliative chemotherapy with Keytruda infusions every 3 weeks, last treatment was 11/3  Stage 3-4 chronic kidney disease -Baseline creatinine around 2-2.4 -This is stable, hold ARB at this time, monitor with diuresis  Chronic right arm DVT  -chronic right arm weakness and stiffness, -Resumed Eliquis   Hypertension -Stable, ARB on hold  DVT prophylaxis: Eliquis Code Status: Full code Family  Communication: No family Disposition Plan: Home tomorrow if stable  Consultants:   Neurology   Procedures:   Antimicrobials:    Subjective: -Numerous complaints, dyspnea on exertion, right facial numbness appears to have resolved, chronic back pain, also reports some difficulty with swallowing  Objective: Vitals:   09/13/19 0138 09/13/19 0139 09/13/19 0745 09/13/19 0915  BP:    (!) 126/92  Pulse:   90   Resp:  19 20 18   Temp:      TempSrc:      SpO2: 98%  98% 100%  Weight:       No intake or output data in the 24 hours ending 09/13/19 1104 Filed Weights   09/12/19 1432  Weight: 69.5 kg    Examination:  General exam: Elderly chronically ill female sitting up in bed AAOx3, no distress Respiratory system: Diminished breath sounds at both bases, few basilar rales Cardiovascular system: S1 & S2 heard, RRR. Gastrointestinal system: Abdomen is nondistended, soft and nontender.Normal bowel sounds heard. Central nervous system: Alert and oriented. No focal neurological deficits. Extremities: No edema Skin: No rashes, lesions or ulcers Psychiatry: Judgement and insight appear normal. Mood & affect appropriate.     Data Reviewed:   CBC: Recent Labs  Lab 09/12/19 1358 09/12/19 1404  WBC 9.2  --   NEUTROABS 5.8  --   HGB 11.5* 12.9  HCT 36.6 38.0  MCV 91.3  --   PLT 301  --    Basic Metabolic Panel: Recent Labs  Lab 09/12/19 1358 09/12/19 1404  NA 139 139  K 4.0 4.0  CL 106 107  CO2 16*  --   GLUCOSE 113* 106*  BUN 22 27*  CREATININE 2.36* 2.10*  CALCIUM 9.2  --    GFR: Estimated Creatinine Clearance: 21.3 mL/min (  A) (by C-G formula based on SCr of 2.1 mg/dL (H)). Liver Function Tests: Recent Labs  Lab 09/12/19 1358  AST 76*  ALT 17  ALKPHOS 141*  BILITOT 1.0  PROT 7.9  ALBUMIN 2.9*   No results for input(s): LIPASE, AMYLASE in the last 168 hours. No results for input(s): AMMONIA in the last 168 hours. Coagulation Profile: Recent Labs  Lab  09/12/19 1358  INR 1.2   Cardiac Enzymes: No results for input(s): CKTOTAL, CKMB, CKMBINDEX, TROPONINI in the last 168 hours. BNP (last 3 results) No results for input(s): PROBNP in the last 8760 hours. HbA1C: No results for input(s): HGBA1C in the last 72 hours. CBG: No results for input(s): GLUCAP in the last 168 hours. Lipid Profile: No results for input(s): CHOL, HDL, LDLCALC, TRIG, CHOLHDL, LDLDIRECT in the last 72 hours. Thyroid Function Tests: No results for input(s): TSH, T4TOTAL, FREET4, T3FREE, THYROIDAB in the last 72 hours. Anemia Panel: No results for input(s): VITAMINB12, FOLATE, FERRITIN, TIBC, IRON, RETICCTPCT in the last 72 hours. Urine analysis:    Component Value Date/Time   COLORURINE YELLOW 08/26/2016 [place] 08/26/2016 0944   LABSPEC 1.020 08/26/2016 0944   PHURINE 6.5 08/26/2016 [place] 08/26/2016 0944   HGBUR NEGATIVE 08/26/2016 0944   BILIRUBINUR NEGATIVE 08/26/2016 0944   KETONESUR NEGATIVE 08/26/2016 0944   PROTEINUR 30 (A) 10/17/2015 1031   UROBILINOGEN 0.2 08/26/2016 0944   NITRITE NEGATIVE 08/26/2016 0944   LEUKOCYTESUR NEGATIVE 08/26/2016 0944   Sepsis Labs: @LABRCNTIP (procalcitonin:4,lacticidven:4)  ) Recent Results (from the past 240 hour(s))  SARS CORONAVIRUS 2 (TAT 6-24 HRS) Nasopharyngeal Nasopharyngeal Swab     Status: None   Collection Time: 09/12/19  6:52 PM   Specimen: Nasopharyngeal Swab  Result Value Ref Range Status   SARS Coronavirus 2 NEGATIVE NEGATIVE Final    Comment: (NOTE) SARS-CoV-2 target nucleic acids are NOT DETECTED. The SARS-CoV-2 RNA is generally detectable in upper and lower respiratory specimens during the acute phase of infection. Negative results do not preclude SARS-CoV-2 infection, do not rule out co-infections with other pathogens, and should not be used as the sole basis for treatment or other patient management decisions. Negative results must be combined with  clinical observations, patient history, and epidemiological information. The expected result is Negative. Fact Sheet for Patients: SugarRoll.be Fact Sheet for Healthcare Providers: https://www.woods-mathews.com/ This test is not yet approved or cleared by the [place] FDA and  has been authorized for detection and/or diagnosis of SARS-CoV-2 by FDA under an Emergency Use Authorization (EUA). This EUA will remain  in effect (meaning this test can be used) for the duration of the COVID-19 declaration under Section 56 4(b)(1) of the Act, 21 U.S.C. section 360bbb-3(b)(1), unless the authorization is terminated or revoked sooner. Performed at [facility], [place] [address] [place], [place] [address]          Radiology Studies: Ct Chest Wo Contrast  Result Date: 09/12/2019 CLINICAL DATA:  Chest pain. Shortness of breath. Metastatic breast cancer. EXAM: CT CHEST WITHOUT CONTRAST TECHNIQUE: Multidetector CT imaging of the chest was performed following the standard protocol without IV contrast. COMPARISON:  08/10/2019 FINDINGS: Cardiovascular: The heart size is normal. No substantial pericardial effusion. Coronary artery calcification is evident. Atherosclerotic calcification is noted in the wall of the thoracic aorta. Prominence of the main pulmonary arteries suggests pulmonary arterial hypertension. Similar appearance of pericardial nodules. Index nodule measured previously at 7 x 11 mm is 8 x 12 mm  today. Right Port-A-Cath tip is in the distal SVC. Mediastinum/Nodes: No mediastinal lymphadenopathy. No evidence for gross hilar lymphadenopathy although assessment is limited by the lack of intravenous contrast on today's study. The esophagus has normal imaging features. There is no axillary lymphadenopathy. Lungs/Pleura: Stable pleuroparenchymal scarring in the right apex, likely radiation fibrosis. Bilateral pulmonary nodules again noted. Posterior  left upper lobe nodule measuring 13 mm today was 12 mm when remeasured in a similar fashion on the prior study. 15 mm anterior left lung nodule (51/5) was 14 mm when measured in a similar fashion on the prior study. 2 cm right infrahilar lower lobe nodule (86/5) was 2 cm previously. Other scattered pulmonary nodules are similar to prior. Small to moderate right pleural effusion is similar. A small left pleural effusion has progressed in the interval. Upper Abdomen: Heterogeneous liver parenchyma compatible with the patient's known metastatic disease, not well demonstrated on noncontrast imaging today. Left adrenal lesion incompletely visualized but approximately 2.7 x 1.1 cm today compared to 2.7 x 1.1 cm previously. Musculoskeletal: Widespread sclerotic bony metastases are similar. 11 mm sclerotic lesion posterior T12 vertebral body on today's study was 11 mm when remeasured in a similar fashion on the prior exam. IMPRESSION: 1. Bilateral pulmonary nodules are stable to minimally progressed in the short interval since prior study. 2. Small to moderate right pleural effusion is stable. A small left pleural effusion on today's study has increased in the interval since prior exam. 3. Similar appearance of pericardial nodules consistent with metastatic disease. 4. Marked heterogeneity of liver parenchyma consistent with bulky metastatic involvement. This is not well demonstrated on today's noncontrast exam. 5. Widespread bony metastatic disease, similar to prior. Electronically Signed   By: Misty Stanley M.D.   On: 09/12/2019 18:31   Mr Brain Wo Contrast  Result Date: 09/13/2019 CLINICAL DATA:  Right face and arm numbness. History of metastatic breast cancer. EXAM: MRI HEAD WITHOUT CONTRAST TECHNIQUE: Multiplanar, multiecho pulse sequences of the brain and surrounding structures were obtained without intravenous contrast. COMPARISON:  Brain MRI 06/29/2014 FINDINGS: BRAIN: There is no acute infarct, acute  hemorrhage or extra-axial collection. Multifocal white matter hyperintensity, most commonly due to chronic ischemic microangiopathy. There are old thalamic and pontine small vessel infarcts. There is generalized atrophy without lobar predilection. The midline structures are normal. VASCULAR: The major intracranial arterial and venous sinus flow voids are normal. Susceptibility-sensitive sequences show no chronic microhemorrhage or superficial siderosis. SKULL AND UPPER CERVICAL SPINE: Calvarial bone marrow signal is normal. There is no skull base mass. The visualized upper cervical spine and soft tissues are normal. SINUSES/ORBITS: There are no fluid levels or advanced mucosal thickening. The mastoid air cells and middle ear cavities are free of fluid. The orbits are normal. IMPRESSION: 1. No acute intracranial abnormality or metastatic disease. 2. Old thalamic and pontine small vessel infarcts. 3. Generalized atrophy and moderate chronic small vessel disease. Electronically Signed   By: Ulyses Jarred M.D.   On: 09/13/2019 02:45   Nm Pulmonary Perfusion  Result Date: 09/12/2019 CLINICAL DATA:  Breast cancer, history pulmonary embolism, acute onset of RIGHT upper extremity weakness and facial paresthesias today EXAM: NUCLEAR MEDICINE PERFUSION LUNG SCAN TECHNIQUE: Perfusion images were obtained in multiple projections after intravenous injection of radiopharmaceutical. Ventilation scans intentionally deferred if perfusion scan and chest x-ray adequate for interpretation during COVID 19 epidemic. RADIOPHARMACEUTICALS:  1.5 mCi Tc-11m MAA IV COMPARISON:  None FINDINGS: Diminished perfusion at the lung bases greater posteriorly. Patient has bibasilar atelectasis and small  LEFT pleural effusion on radiograph. No additional segmental or subsegmental perfusion defects are identified. Ventilation exam was not performed, unknown COVID status. Findings represent a low probability for pulmonary embolism. IMPRESSION:  Mildly diminished perfusion at the lung bases especially posteriorly, less significant than suspected from the bibasilar atelectasis and LEFT pleural effusion seen on chest radiograph. Low probability of pulmonary embolism. Electronically Signed   By: Lavonia Dana M.D.   On: 09/12/2019 18:00   Dg Chest Portable 1 View  Result Date: 09/12/2019 CLINICAL DATA:  Chest pain short of breath.  Breast cancer. EXAM: PORTABLE CHEST 1 VIEW COMPARISON:  06/29/2014.  Chest CT 06/10/2019 FINDINGS: Mild bibasilar airspace disease left greater than right. Small bilateral effusions. Small densities left upper lobe and left hilum. Possible small density right upper lobe. These are compatible with metastatic deposits as seen on recent CT. Port-A-Cath tip in the lower SVC. IMPRESSION: Bibasilar airspace disease and small pleural effusions. Possible pneumonia or malignant effusion. Metastatic disease in the lungs. Electronically Signed   By: Franchot Gallo M.D.   On: 09/12/2019 15:50   Ct Head Code Stroke Wo Contrast  Result Date: 09/12/2019 CLINICAL DATA:  Code stroke.  Left facial droop.  Stroke EXAM: CT HEAD WITHOUT CONTRAST TECHNIQUE: Contiguous axial images were obtained from the base of the skull through the vertex without intravenous contrast. COMPARISON:  CT head 06/29/2014 FINDINGS: Brain: Mild atrophy which has progressed in the interval. Negative for hydrocephalus. Small hypodensity right pons compatible with chronic infarct is unchanged. Negative for acute infarct.  Negative for hemorrhage or mass. Vascular: Negative for hyperdense vessel Skull: Negative Sinuses/Orbits: Air-fluid level sphenoid sinus. Remaining sinuses clear. Negative orbit Other: None ASPECTS ([place] Stroke Program Early CT Score) - Ganglionic level infarction (caudate, lentiform nuclei, internal capsule, insula, M1-M3 cortex): 7 - Supraganglionic infarction (M4-M6 cortex): 3 Total score (0-10 with 10 being normal): 10 IMPRESSION: 1. No acute  intracranial abnormality. 2. ASPECTS is 10 Electronically Signed   By: Franchot Gallo M.D.   On: 09/12/2019 14:33        Scheduled Meds:  amLODipine  10 mg Oral Daily   apixaban  5 mg Oral BID   docusate sodium  100 mg Oral BID   dronabinol  2.5 mg Oral BID AC   furosemide  20 mg Intravenous BID   gabapentin  100 mg Oral QHS   LORazepam  0.25 mg Intravenous Once   mirtazapine  7.5 mg Oral QHS   pioglitazone  15 mg Oral Daily   sodium chloride flush  3 mL Intravenous Once   traMADol  50 mg Oral BID   Continuous Infusions:   LOS: 0 days    Time spent: 28min    Domenic Polite, MD [facility] Page via [email or web address], password [email or web address] After 7PM please contact night-coverage  09/13/2019, 11:04 AM

## 2019-09-13 NOTE — Progress Notes (Signed)
STROKE TEAM PROGRESS NOTE   INTERVAL HISTORY Her  RNis at the bedside.  I have personally reviewed history of presenting illness, electronic medical records and imaging films in PACS.  She presented with multifocal symptoms of right upper extremity numbness, left leg weakness and shortness of breath and chest pain.  She still has multiple subjective complaints but MRI scan of the brain yesterday was negative for acute stroke.  Vitals:   09/12/19 1545 09/12/19 2330 09/13/19 0138 09/13/19 0139  BP: 136/74 124/62    Pulse: 98 91    Resp: 20 (!) 22  19  Temp:      TempSrc:      SpO2: 100% 97% 98%   Weight:        CBC:  Recent Labs  Lab 09/12/19 1358 09/12/19 1404  WBC 9.2  --   NEUTROABS 5.8  --   HGB 11.5* 12.9  HCT 36.6 38.0  MCV 91.3  --   PLT 301  --     Basic Metabolic Panel:  Recent Labs  Lab 09/12/19 1358 09/12/19 1404  NA 139 139  K 4.0 4.0  CL 106 107  CO2 16*  --   GLUCOSE 113* 106*  BUN 22 27*  CREATININE 2.36* 2.10*  CALCIUM 9.2  --    Lipid Panel:     Component Value Date/Time   CHOL 163 09/04/2019 1128   TRIG 112 09/04/2019 1128   HDL 44 09/04/2019 1128   CHOLHDL 3.7 09/04/2019 1128   VLDL 22 09/04/2019 1128   LDLCALC 97 09/04/2019 1128   HgbA1c:  Lab Results  Component Value Date   HGBA1C 5.8 (H) 08/10/2019   Urine Drug Screen: No results found for: LABOPIA, COCAINSCRNUR, LABBENZ, AMPHETMU, THCU, LABBARB  Alcohol Level No results found for: [facility]  IMAGING Ct Chest Wo Contrast  Result Date: 09/12/2019 CLINICAL DATA:  Chest pain. Shortness of breath. Metastatic breast cancer. EXAM: CT CHEST WITHOUT CONTRAST TECHNIQUE: Multidetector CT imaging of the chest was performed following the standard protocol without IV contrast. COMPARISON:  08/10/2019 FINDINGS: Cardiovascular: The heart size is normal. No substantial pericardial effusion. Coronary artery calcification is evident. Atherosclerotic calcification is noted in the wall of the thoracic aorta.  Prominence of the main pulmonary arteries suggests pulmonary arterial hypertension. Similar appearance of pericardial nodules. Index nodule measured previously at 7 x 11 mm is 8 x 12 mm today. Right Port-A-Cath tip is in the distal SVC. Mediastinum/Nodes: No mediastinal lymphadenopathy. No evidence for gross hilar lymphadenopathy although assessment is limited by the lack of intravenous contrast on today's study. The esophagus has normal imaging features. There is no axillary lymphadenopathy. Lungs/Pleura: Stable pleuroparenchymal scarring in the right apex, likely radiation fibrosis. Bilateral pulmonary nodules again noted. Posterior left upper lobe nodule measuring 13 mm today was 12 mm when remeasured in a similar fashion on the prior study. 15 mm anterior left lung nodule (51/5) was 14 mm when measured in a similar fashion on the prior study. 2 cm right infrahilar lower lobe nodule (86/5) was 2 cm previously. Other scattered pulmonary nodules are similar to prior. Small to moderate right pleural effusion is similar. A small left pleural effusion has progressed in the interval. Upper Abdomen: Heterogeneous liver parenchyma compatible with the patient's known metastatic disease, not well demonstrated on noncontrast imaging today. Left adrenal lesion incompletely visualized but approximately 2.7 x 1.1 cm today compared to 2.7 x 1.1 cm previously. Musculoskeletal: Widespread sclerotic bony metastases are similar. 11 mm sclerotic lesion posterior T12  vertebral body on today's study was 11 mm when remeasured in a similar fashion on the prior exam. IMPRESSION: 1. Bilateral pulmonary nodules are stable to minimally progressed in the short interval since prior study. 2. Small to moderate right pleural effusion is stable. A small left pleural effusion on today's study has increased in the interval since prior exam. 3. Similar appearance of pericardial nodules consistent with metastatic disease. 4. Marked heterogeneity of  liver parenchyma consistent with bulky metastatic involvement. This is not well demonstrated on today's noncontrast exam. 5. Widespread bony metastatic disease, similar to prior. Electronically Signed   By: Misty Stanley M.D.   On: 09/12/2019 18:31   Mr Brain Wo Contrast  Result Date: 09/13/2019 CLINICAL DATA:  Right face and arm numbness. History of metastatic breast cancer. EXAM: MRI HEAD WITHOUT CONTRAST TECHNIQUE: Multiplanar, multiecho pulse sequences of the brain and surrounding structures were obtained without intravenous contrast. COMPARISON:  Brain MRI 06/29/2014 FINDINGS: BRAIN: There is no acute infarct, acute hemorrhage or extra-axial collection. Multifocal white matter hyperintensity, most commonly due to chronic ischemic microangiopathy. There are old thalamic and pontine small vessel infarcts. There is generalized atrophy without lobar predilection. The midline structures are normal. VASCULAR: The major intracranial arterial and venous sinus flow voids are normal. Susceptibility-sensitive sequences show no chronic microhemorrhage or superficial siderosis. SKULL AND UPPER CERVICAL SPINE: Calvarial bone marrow signal is normal. There is no skull base mass. The visualized upper cervical spine and soft tissues are normal. SINUSES/ORBITS: There are no fluid levels or advanced mucosal thickening. The mastoid air cells and middle ear cavities are free of fluid. The orbits are normal. IMPRESSION: 1. No acute intracranial abnormality or metastatic disease. 2. Old thalamic and pontine small vessel infarcts. 3. Generalized atrophy and moderate chronic small vessel disease. Electronically Signed   By: Ulyses Jarred M.D.   On: 09/13/2019 02:45   Nm Pulmonary Perfusion  Result Date: 09/12/2019 CLINICAL DATA:  Breast cancer, history pulmonary embolism, acute onset of RIGHT upper extremity weakness and facial paresthesias today EXAM: NUCLEAR MEDICINE PERFUSION LUNG SCAN TECHNIQUE: Perfusion images were  obtained in multiple projections after intravenous injection of radiopharmaceutical. Ventilation scans intentionally deferred if perfusion scan and chest x-ray adequate for interpretation during COVID 19 epidemic. RADIOPHARMACEUTICALS:  1.5 mCi Tc-60m MAA IV COMPARISON:  None FINDINGS: Diminished perfusion at the lung bases greater posteriorly. Patient has bibasilar atelectasis and small LEFT pleural effusion on radiograph. No additional segmental or subsegmental perfusion defects are identified. Ventilation exam was not performed, unknown COVID status. Findings represent a low probability for pulmonary embolism. IMPRESSION: Mildly diminished perfusion at the lung bases especially posteriorly, less significant than suspected from the bibasilar atelectasis and LEFT pleural effusion seen on chest radiograph. Low probability of pulmonary embolism. Electronically Signed   By: Lavonia Dana M.D.   On: 09/12/2019 18:00   Dg Chest Portable 1 View  Result Date: 09/12/2019 CLINICAL DATA:  Chest pain short of breath.  Breast cancer. EXAM: PORTABLE CHEST 1 VIEW COMPARISON:  06/29/2014.  Chest CT 06/10/2019 FINDINGS: Mild bibasilar airspace disease left greater than right. Small bilateral effusions. Small densities left upper lobe and left hilum. Possible small density right upper lobe. These are compatible with metastatic deposits as seen on recent CT. Port-A-Cath tip in the lower SVC. IMPRESSION: Bibasilar airspace disease and small pleural effusions. Possible pneumonia or malignant effusion. Metastatic disease in the lungs. Electronically Signed   By: Franchot Gallo M.D.   On: 09/12/2019 15:50   Ct Head Code Stroke  Wo Contrast  Result Date: 09/12/2019 CLINICAL DATA:  Code stroke.  Left facial droop.  Stroke EXAM: CT HEAD WITHOUT CONTRAST TECHNIQUE: Contiguous axial images were obtained from the base of the skull through the vertex without intravenous contrast. COMPARISON:  CT head 06/29/2014 FINDINGS: Brain: Mild  atrophy which has progressed in the interval. Negative for hydrocephalus. Small hypodensity right pons compatible with chronic infarct is unchanged. Negative for acute infarct.  Negative for hemorrhage or mass. Vascular: Negative for hyperdense vessel Skull: Negative Sinuses/Orbits: Air-fluid level sphenoid sinus. Remaining sinuses clear. Negative orbit Other: None ASPECTS ([place] Stroke Program Early CT Score) - Ganglionic level infarction (caudate, lentiform nuclei, internal capsule, insula, M1-M3 cortex): 7 - Supraganglionic infarction (M4-M6 cortex): 3 Total score (0-10 with 10 being normal): 10 IMPRESSION: 1. No acute intracranial abnormality. 2. ASPECTS is 10 Electronically Signed   By: Franchot Gallo M.D.   On: 09/12/2019 14:33    PHYSICAL EXAM Obese elderly African-American lady who appears anxious and in mild respiratory distress.  She has a IV port in the left infra clavicular region for chemotherapy. . Afebrile. Head is nontraumatic. Neck is supple without bruit.    Cardiac exam no murmur or gallop. Lungs are clear to auscultation. Distal pulses are well felt.  Neurological Exam :  She is awake alert she is oriented to time place and person.  She appears anxious.  There is no aphasia apraxia or dysarthria.  She follows commands well.  Extraocular movements are full range without nystagmus.  She blinks to threat bilaterally.  Fundi were not visualized but vision acuity appears adequate.  There is no facial asymmetry or weakness.  Tongue midline.  Motor system exam patient's effort is poor and variable but there does not appear to be upper or lower extremity drift.  She is able to move all 4 extremities against gravity but is not able to sustain it for long.  Deep tendon reflexes are symmetric plantars downgoing.  Sensation subjective diminished sensation in the right upper extremity only.  Normal sensation in the face and lower extremities.  Plantars downgoing.  Gait not  tested  ASSESSMENT/PLAN Ms. [name] [name] is a 78 y.o. female with history of recurrent metastatic breast cancer x3 since 1995 status post chemotherapy, hypertension, hyperlipidemia, osteoarthritis and previous history of TIA x2 in 2011presenting with right face and arm numbness as well as left leg numbness.    78 year old lady presenting with sudden onset of right upper extremity and face paresthesias along with left lower extremity weakness all of which appear to be improving and are in a pattern and distribution which are not highly suggestive of acute stroke    Code Stroke CT head No acute abnormality. Small vessel disease. Atrophy.  ASPECTS 10.     MRI brain no acute abnormality  LDL 97  HgbA1c 5.8 Recommend continue ongoing medical management as per Dr. Broadus John.  No further stroke work-up is necessary.  Stroke team will sign off.  Kindly call for questions.  Discussed with Dr. Broadus John and patient [name] Contras, MD To contact Stroke Continuity provider, please refer to [email or web address] After hours, contact General Neurology

## 2019-09-13 NOTE — ED Notes (Signed)
MRI called for the pt.

## 2019-09-14 DIAGNOSIS — E43 Unspecified severe protein-calorie malnutrition: Secondary | ICD-10-CM | POA: Insufficient documentation

## 2019-09-14 LAB — MAGNESIUM: Magnesium: 2.6 mg/dL — ABNORMAL HIGH (ref 1.7–2.4)

## 2019-09-14 MED ORDER — ADULT MULTIVITAMIN W/MINERALS CH
1.0000 | ORAL_TABLET | Freq: Every day | ORAL | Status: DC
  Administered 2019-09-14 – 2019-09-22 (×8): 1 via ORAL
  Filled 2019-09-14 (×8): qty 1

## 2019-09-14 MED ORDER — ENSURE ENLIVE PO LIQD
237.0000 mL | Freq: Three times a day (TID) | ORAL | Status: DC
  Administered 2019-09-14 – 2019-09-22 (×16): 237 mL via ORAL

## 2019-09-14 NOTE — Progress Notes (Signed)
OT Cancellation Note  Patient Details Name: [name] [name] MRN: [id number] DOB: Mar 10, 1941   Cancelled Treatment:    Reason Eval/Treat Not Completed: Medical issues which prohibited therapy(vomiting).  Pt having just completed SLP session, pt began burping and then vomiting. RN notified.  Will attempt to see later in the day as schedule allows.  Simonne Come 09/14/2019, 11:11 AM

## 2019-09-14 NOTE — Evaluation (Signed)
Occupational Therapy Evaluation Patient Details Name: [name] [name] MRN: [id number] DOB: [birth year] Today's Date: 09/14/2019    History of Present Illness [name] [name] is a 78 y.o. female with medical history significant of cancer in 2002 treated successfully.  Currently with a recurrence with widely metastatic disease involving liver lungs bone.  She is still undergoing treatment for her cancer and had a DVT in the R UE.  She presented with R facial droop and weakness over couple of weeks and now with some speech and swallowing issues.   Clinical Impression   Pt admitted with above and presents to OT with impairments impacting ability to complete ADLs at [facility]. Pt demonstrating decreased dominant RUE gross and fine motor control impacting ability to complete self-care tasks.  Pt required assistance to open containers from lunch tray.  Pt required min-mod assist sit > stand and stand pivot transfers.  Requires min assist for dynamic standing balance and increased time and assist to incorporate use of RUE into self-care tasks.  Pt will benefit from OT acutely to increase functional use of RUE and decrease burden of care with self-care tasks prior to d/c home with family who will be able to provide 24/7 assist for ~2 weeks upon d/c.    Follow Up Recommendations  Home health OT;Supervision/Assistance - 24 hour    Equipment Recommendations  Other (comment)(TBD)       Precautions / Restrictions Precautions Precautions: Fall Restrictions Weight Bearing Restrictions: No      Mobility Bed Mobility Overal bed mobility: Needs Assistance Bed Mobility: Supine to Sit;Sit to Supine     Supine to sit: Min assist Sit to supine: Min assist   General bed mobility comments: increased time and struggle to rise to sitting from supine, ultimately requiring min assist for trunk control  Transfers Overall transfer level: Needs assistance Equipment used: Rolling walker (2 wheeled) Transfers: Sit  to/from Stand Sit to Stand: Min assist;Mod assist         General transfer comment: Mod assist sit > stand from EOB, once upright min assist for transfer and dynamic standing balance    Balance Overall balance assessment: Needs assistance Sitting-balance support: Feet supported Sitting balance-Leahy Scale: Good     Standing balance support: Bilateral upper extremity supported Standing balance-Leahy Scale: Poor Standing balance comment: UE support for balance                           ADL either performed or assessed with clinical judgement   ADL Overall ADL's : Needs assistance/impaired Eating/Feeding: Set up;Supervision/ safety;Cueing for compensatory techinques Eating/Feeding Details (indicate cue type and reason): Pt required assist to open containers.  Provided cues and education on Dysphagia 2 diet Grooming: Wash/dry face;Wash/dry hands;Minimal assistance;Sitting Grooming Details (indicate cue type and reason): utilized LUE for majority of grooming tasks             Lower Body Dressing: Moderate assistance   Toilet Transfer: Moderate assistance;Stand-pivot;RW Toilet Transfer Details (indicate cue type and reason): Mod assist sit > stand, min assist once upright Toileting- Clothing Manipulation and Hygiene: Moderate assistance;Sit to/from stand       Functional mobility during ADLs: Minimal assistance;Moderate assistance       Vision Baseline Vision/History: Wears glasses Wears Glasses: Reading only Patient Visual Report: No change from baseline Vision Assessment?: No apparent visual deficits            Pertinent Vitals/Pain Pain Assessment: 0-10 Pain Score: 4  Pain Location: back with activity Pain Descriptors / Indicators: Sore;Aching Pain Intervention(s): Limited activity within patient's tolerance;Monitored during session;Repositioned     Hand Dominance Right   Extremity/Trunk Assessment Upper Extremity Assessment Upper Extremity  Assessment: RUE deficits/detail;LUE deficits/detail RUE Deficits / Details: edema throughout the UE; states she does have a sleeve at home; strength shoulder flexion 2/5, elbow flexion 3+/5, extension 2+/5, weak grip RUE Sensation: decreased light touch RUE Coordination: decreased fine motor LUE Deficits / Details: AROM WFL, strength grossly 4/5   Lower Extremity Assessment Lower Extremity Assessment: Defer to PT evaluation   Cervical / Trunk Assessment Cervical / Trunk Assessment: Kyphotic   Communication Communication Communication: No difficulties   Cognition Arousal/Alertness: Awake/alert Behavior During Therapy: WFL for tasks assessed/performed Overall Cognitive Status: Within Functional Limits for tasks assessed Area of Impairment: Memory                     Memory: Decreased short-term memory                    Home Living Family/patient expects to be discharged to:: Private residence   Available Help at Discharge: Family;Available 24 hours/day Type of Home: House Home Access: Level entry     Home Layout: One level     Bathroom Shower/Tub: Tub/shower unit         Home Equipment: Tub bench;Bedside commode;Walker - 2 wheels;Walker - 4 wheels;Wheelchair - manual          Prior Functioning/Environment Level of Independence: Independent with assistive device(s);Needs assistance    ADL's / Homemaking Assistance Needed: intermittent housekeeping help, has meals delivered   Comments: uses rollator at home        OT Problem List: Decreased strength;Decreased range of motion;Decreased activity tolerance;Impaired balance (sitting and/or standing);Decreased coordination;Decreased cognition;Decreased safety awareness;Decreased knowledge of use of DME or AE;Decreased knowledge of precautions;Impaired sensation;Impaired UE functional use;Pain;Increased edema      OT Treatment/Interventions: Self-care/ADL training;Neuromuscular education;DME and/or AE  instruction;Therapeutic activities;Cognitive remediation/compensation;Patient/family education;Balance training    OT Goals(Current goals can be found in the care plan section) Acute Rehab OT Goals Patient Stated Goal: to go home, get stronger OT Goal Formulation: With patient Time For Goal Achievement: 09/28/19 Potential to Achieve Goals: Good  OT Frequency: Min 2X/week   Barriers to D/C: Decreased caregiver support             AM-PAC OT "6 Clicks" Daily Activity     Outcome Measure Help from another person eating meals?: A Little Help from another person taking care of personal grooming?: A Little Help from another person toileting, which includes using toliet, bedpan, or urinal?: A Lot Help from another person bathing (including washing, rinsing, drying)?: A Lot Help from another person to put on and taking off regular upper body clothing?: A Little Help from another person to put on and taking off regular lower body clothing?: A Lot 6 Click Score: 15   End of Session Equipment Utilized During Treatment: Gait belt;Rolling walker Nurse Communication: Mobility status  Activity Tolerance: Patient tolerated treatment well Patient left: in chair;with call bell/phone within reach;with chair alarm set  OT Visit Diagnosis: Unsteadiness on feet (R26.81);Muscle weakness (generalized) (M62.81);Other symptoms and signs involving the nervous system (R29.898);Hemiplegia and hemiparesis Hemiplegia - Right/Left: Right Hemiplegia - dominant/non-dominant: Dominant                Time: 9381-8299 OT Time Calculation (min): 20 min Charges:  OT General Charges $OT Visit: 1 Visit OT  Evaluation $OT Eval Moderate Complexity: [place], [place] 09/14/2019, 3:36 PM

## 2019-09-14 NOTE — Progress Notes (Signed)
Physical Therapy Treatment Patient Details Name: [name] [name] MRN: [id number] DOB: [birth year] Today's Date: 09/14/2019    History of Present Illness [name] [name] is a 78 y.o. female with medical history significant of cancer in 2002 treated successfully.  Currently with a recurrence with widely metastatic disease involving liver lungs bone.  She is still undergoing treatment for her cancer and had a DVT in the R UE.  She presented with R facial droop and weakness over couple of weeks and now with some speech and swallowing issues.    PT Comments    Pt pleasantly confused on arrival with difficulty problem solving and recognizing deficits. Pt with decreased activity tolerance, no assist at home and inability to perform basic mobility let alone homemaking and cannot return home alone safely. Pt agreeable to sNF end of session stating recognition of continued decline. Pt limited by pain and fatigue.     Follow Up Recommendations  Supervision/Assistance - 24 hour;SNF     Equipment Recommendations  None recommended by PT    Recommendations for Other Services       Precautions / Restrictions Precautions Precautions: Fall    Mobility  Bed Mobility Overal bed mobility: Needs Assistance Bed Mobility: Supine to Sit;Sit to Supine     Supine to sit: Min guard Sit to supine: Min assist   General bed mobility comments: increased time and struggle to rise to sitting from supine. Assist to bring legs onto surface and straighten body in bed with pt declining up in chair  Transfers Overall transfer level: Needs assistance   Transfers: Sit to/from Stand Sit to Stand: Min assist;Mod assist         General transfer comment: min assist to stand from bed and from chair with armrests, mod to stand from toilet with rail with significant assist for anterior translation and rise  Ambulation/Gait Ambulation/Gait assistance: Min guard Gait Distance (Feet): 16 Feet Assistive device:  Rolling walker (2 wheeled) Gait Pattern/deviations: Step-through pattern;Decreased stride length;Trunk flexed;Wide base of support   Gait velocity interpretation: <1.8 ft/sec, indicate of risk for recurrent falls General Gait Details: pt walked 16' to bathroom then was able to walk out to sink 10' but needed to sit immediately after due to pain and fatigue prior to final 6' for return to bed as pt denied OOB to chair. SpO2 97% on RA with HR 118 with activity. Pt reports bil hip and back pain limiting mobiilty and contributing to sudden fatigue   Stairs             Wheelchair Mobility    Modified Rankin (Stroke Patients Only)       Balance Overall balance assessment: Needs assistance Sitting-balance support: Feet supported Sitting balance-Leahy Scale: Good     Standing balance support: Bilateral upper extremity supported Standing balance-Leahy Scale: Poor Standing balance comment: UE support for balance                            Cognition Arousal/Alertness: Awake/alert Behavior During Therapy: WFL for tasks assessed/performed Overall Cognitive Status: Impaired/Different from baseline Area of Impairment: Memory                     Memory: Decreased short-term memory         General Comments: pt stating they gave her medication to knock her out and even after reading meds to pt she was insistent there was more. pt forgetting instructions within session  and where her ice water is on tray      Exercises General Exercises - Lower Extremity Short Arc Quad: AROM;Both;10 reps;Seated    General Comments        Pertinent Vitals/Pain Pain Assessment: 0-10 Pain Score: 4  Pain Location: back with activity Pain Descriptors / Indicators: Sore;Aching Pain Intervention(s): Limited activity within patient's tolerance;Repositioned;Monitored during session    Home Living                      Prior Function            PT Goals (current  goals can now be found in the care plan section) Progress towards PT goals: Progressing toward goals    Frequency    Min 4X/week      PT Plan Discharge plan needs to be updated    Co-evaluation              AM-PAC PT "6 Clicks" Mobility   Outcome Measure  Help needed turning from your back to your side while in a flat bed without using bedrails?: A Little Help needed moving from lying on your back to sitting on the side of a flat bed without using bedrails?: A Little Help needed moving to and from a bed to a chair (including a wheelchair)?: A Little Help needed standing up from a chair using your arms (e.g., wheelchair or bedside chair)?: A Lot Help needed to walk in hospital room?: A Little Help needed climbing 3-5 steps with a railing? : Total 6 Click Score: 15    End of Session   Activity Tolerance: Patient limited by fatigue Patient left: in bed;with call bell/phone within reach;with bed alarm set Nurse Communication: Mobility status PT Visit Diagnosis: Muscle weakness (generalized) (M62.81);Difficulty in walking, not elsewhere classified (R26.2)     Time: 4944-9675 PT Time Calculation (min) (ACUTE ONLY): 33 min  Charges:  $Gait Training: 8-22 mins $Therapeutic Activity: 8-22 mins                     Kayler Rise P, PT Acute Rehabilitation Services Pager: [phone] Office: [place] 09/14/2019, 1:19 PM

## 2019-09-14 NOTE — Consult Note (Signed)
   [facility] Psychiatric Inpatient Consult   09/14/2019  DILLAN LUNDEN 05/23/1941 [id number]  Patient is currently active with [facility] for chronic disease management services.  Patient has been engaged by a [facility] Management Coordinator who is aware of the patient's hospitalization.  Our community based plan of care has focused on complex disease management and community resource support.    Plan: Follow for progress and disposition needs for post hospital transition.  Notified Inpatient TOC team member to make aware that [facility] following for progress and post hospital needs, if appropriate.   Of note, [facility] Care Management services does not replace or interfere with any services that are needed or arranged by inpatient [facility] care management team.  For additional questions or referrals please contact:  Natividad Brood, RN BSN [facility] Liaison  [phone] business mobile phone Toll free office [phone]  Fax number: [phone] [email or web address]

## 2019-09-14 NOTE — [place] FL2 (Addendum)
[place] MEDICAID FL2 LEVEL OF CARE SCREENING TOOL     IDENTIFICATION  Patient Name: [name] [name] Birthdate: May 12, 1941 Sex: female Admission Date (Current Location): 09/12/2019  [facility] and [place] Number:  Herbalist and Address:  The [place], [place] [address] [place], [place] [address]      Provider Number: [id number]  Attending Physician Name and Address:  Jonnie Finner, DO  Relative Name and Phone Number:       Current Level of Care: Hospital Recommended Level of Care: [place] Prior Approval Number:  Manual Review  Date Approved/Denied:   PASRR Number:  [id number]  Discharge Plan: SNF    Current Diagnoses: Patient Active Problem List   Diagnosis Date Noted  . Protein-calorie malnutrition, severe 09/14/2019  . Chest pain 09/12/2019  . Facial weakness 09/12/2019  . Weight loss 06/07/2019  . FTT (failure to thrive) in adult 06/07/2019  . Secondary malignant neoplasm of axillary node ([place]) 01/15/2019  . DVT (deep venous thrombosis) ([place]) 12/20/2018  . Hip pain 10/09/2018  . Hand weakness 08/21/2018  . Ingrowing toenail 04/19/2018  . Stress 12/20/2017  . Chemotherapy induced neutropenia ([place]) 08/15/2017  . Goals of care, counseling/discussion 04/19/2017  . Encounter for therapeutic drug monitoring 04/14/2017  . Arm pain, anterior, left 03/08/2017  . Dry skin dermatitis 11/29/2016  . Diarrhea 11/12/2016  . Left shoulder pain 08/26/2016  . Port catheter in place 07/20/2016  . Rash and nonspecific skin eruption 04/13/2016  . Leg swelling 03/02/2016  . Obese 10/29/2015  . S/P right THA, AA 10/28/2015  . Full code status 08/06/2015  . Knee pain, right 07/28/2015  . Osteoarthritis of right lower extremity 09/17/2014  . Cyst of lateral meniscus 08/27/2014  . Cyst of lateral meniscus of right knee 08/27/2014  . Benign paroxysmal positional vertigo 07/12/2014  . Hypokalemia 07/06/2013  . Knee pain, right  anterior 04/04/2013  . Creatinine elevation 04/04/2013  . Dyspnea 05/12/2012  . GERD (gastroesophageal reflux disease) 05/12/2012  . Secondary diabetes mellitus with renal manifestations ([place]) 02/11/2012  . Breast cancer metastasized to bone, right ([place]) 04/23/2011  . Edema 04/23/2011  . Situational anxiety 11/03/2010  . Osteoarthritis 10/21/2010  . Low back pain 10/21/2010  . HIP PAIN 10/07/2010  . NAUSEA AND VOMITING 08/26/2010  . HYPERLIPIDEMIA 02/18/2010  . CEREBROVASCULAR ACCIDENT, HX OF 02/18/2010  . WEIGHT GAIN, ABNORMAL 12/19/2009  . LEG PAIN, RIGHT 04/16/2009  . Vitamin D deficiency 03/07/2009  . Hidradenitis 04/12/2008  . FATIGUE 10/03/2007  . 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 Essential hypertension 10/02/2007  . Asthma 10/02/2007    Orientation RESPIRATION BLADDER Height & Weight     Self, Time, Situation, Place  Normal Continent Weight: 69.5 kg Height:     BEHAVIORAL SYMPTOMS/MOOD NEUROLOGICAL BOWEL NUTRITION STATUS      Continent Diet(dysphagia 2 with thin liquids)  AMBULATORY STATUS COMMUNICATION OF NEEDS Skin   Extensive Assist Verbally Normal                       Personal Care Assistance Level of Assistance  Bathing, Feeding, Dressing Bathing Assistance: Limited assistance Feeding assistance: Limited assistance Dressing Assistance: Limited assistance     Functional Limitations Info  Sight, Hearing, Speech Sight Info: Adequate Hearing Info: Adequate Speech Info: Adequate    SPECIAL CARE FACTORS FREQUENCY  PT (By licensed PT), OT (By licensed OT), Speech therapy     PT Frequency: 5x/wk OT Frequency: 5x/wk  Speech Therapy Frequency: 5x/wk      Contractures Contractures Info: Not present    Additional Factors Info  Code Status, Allergies, Psychotropic Code Status Info: Full Allergies Info: NKA Psychotropic Info: Marinol 2.5 mg twice a day/ Neurontin 100 mg at bedtime/ Remeron 7.5 mg at bedtime/ Ultram 50 mg BiD         Current  Medications (09/14/2019):  This is the current hospital active medication list Current Facility-Administered Medications  Medication Dose Route Frequency Provider Last Rate Last Dose  . ALPRAZolam Duanne Moron) tablet 0.5 mg  0.5 mg Oral BID PRN Neena Rhymes, MD   0.5 mg at 09/13/19 0114  . amLODipine (NORVASC) tablet 10 mg  10 mg Oral Daily Norins, Heinz Knuckles, MD   10 mg at 09/14/19 1032  . apixaban (ELIQUIS) tablet 5 mg  5 mg Oral BID Norins, Heinz Knuckles, MD   5 mg at 09/14/19 1032  . Chlorhexidine Gluconate Cloth 2 % PADS 6 each  6 each Topical Daily Domenic Polite, MD   6 each at 09/14/19 1032  . docusate sodium (COLACE) capsule 100 mg  100 mg Oral BID Neena Rhymes, MD   100 mg at 09/14/19 1032  . dronabinol (MARINOL) capsule 2.5 mg  2.5 mg Oral BID AC Norins, Heinz Knuckles, MD   2.5 mg at 09/14/19 0813  . feeding supplement (ENSURE ENLIVE) (ENSURE ENLIVE) liquid 237 mL  237 mL Oral TID BM Kyle, Tyrone A, DO      . furosemide (LASIX) injection 20 mg  20 mg Intravenous BID Domenic Polite, MD   20 mg at 09/14/19 0813  . gabapentin (NEURONTIN) capsule 100 mg  100 mg Oral QHS Norins, Heinz Knuckles, MD   100 mg at 09/13/19 2146  . LORazepam (ATIVAN) injection 0.25 mg  0.25 mg Intravenous Once Tegeler, Gwenyth Allegra, MD      . meclizine (ANTIVERT) tablet 12.5 mg  12.5 mg Oral BID PRN Norins, Heinz Knuckles, MD      . mirtazapine (REMERON) tablet 7.5 mg  7.5 mg Oral QHS Norins, Heinz Knuckles, MD   7.5 mg at 09/13/19 2146  . multivitamin with minerals tablet 1 tablet  1 tablet Oral Daily Kyle, Tyrone A, DO   1 tablet at 09/14/19 1047  . ondansetron (ZOFRAN) tablet 8 mg  8 mg Oral Q8H PRN Norins, Heinz Knuckles, MD      . pioglitazone (ACTOS) tablet 15 mg  15 mg Oral Daily Norins, Heinz Knuckles, MD   15 mg at 09/14/19 1032  . prochlorperazine (COMPAZINE) tablet 10 mg  10 mg Oral Q6H PRN Neena Rhymes, MD   10 mg at 09/13/19 2146  . sodium chloride flush (NS) 0.9 % injection 3 mL  3 mL Intravenous Once Tegeler,  Gwenyth Allegra, MD      . sorbitol 70 % solution 30 mL  30 mL Oral Daily PRN Norins, Heinz Knuckles, MD      . traMADol Veatrice Bourbon) tablet 50 mg  50 mg Oral BID Norins, Heinz Knuckles, MD   50 mg at 09/14/19 1032   Facility-Administered Medications Ordered in Other Encounters  Medication Dose Route Frequency Provider Last Rate Last Dose  . sodium chloride flush (NS) 0.9 % injection 10 mL  10 mL Intravenous PRN Curt Bears, MD   10 mL at 09/04/19 1128     Discharge Medications: Please see discharge summary for a list of discharge medications.  Relevant Imaging Results:  Relevant Lab Results:   Additional Information SS #  [id number]  Pollie Friar, RN

## 2019-09-14 NOTE — Progress Notes (Signed)
Hematology oncology: Reviewed the chart -Patient has been admitted with neurological symptoms of facial numbness and leg weakness.  MRI of the brain did not show metastatic disease. -Metastatic breast cancer: Progressed on multiple lines of therapy.  Currently on immunotherapy with pembrolizumab and received her first infusion recently. It is unlikely that the symptoms are related to the immunotherapy treatment. -Chronic right arm lymphedema scarring -Labs have been reviewed: Chronic normocytic anemia prior treatments and her malignancy I will follow her Monday.  If there are any acute hematology oncology issues please do not hesitate to call the on-call physician.

## 2019-09-14 NOTE — Progress Notes (Signed)
Initial Nutrition Assessment  RD working remotely.  DOCUMENTATION CODES:   Severe malnutrition in context of chronic illness  INTERVENTION:   -MVI with minerals daily -Increase Ensure Enlive po to TID, each supplement provides 350 kcal and 20 grams of protein -Feeding assistance with meals  NUTRITION DIAGNOSIS:   Severe Malnutrition related to chronic illness(metastatic breast cancer) as evidenced by energy intake < 75% for > or equal to 1 month, percent weight loss.  GOAL:   Patient will meet greater than or equal to 90% of their needs  MONITOR:   PO intake, Supplement acceptance, Diet advancement, Labs, Weight trends, Skin, I & O's  REASON FOR ASSESSMENT:   Malnutrition Screening Tool    ASSESSMENT:   [name] [name] is a 78 y.o. female with medical history significant of cancer in 2002 treated successfully.  She now with a recurrence with widely metastatic disease evolving liver lungs bone.  She is followed by oncology service and she is started and failed several therapies and currently is on Keytruda IV every 3 weeks.  She is status post palliative radiation therapy to the neck and subclavian area.  Patient did have a DVT of the right upper extremity is caused her to have significant limitations in the use of her right arm.  Patient does live alone.  She reports that she has had this right-sided weakness for several weeks but this morning she had the onset of weakness in the right face.  She also complains of a change in her speech pattern some difficulty in swallowing.  The symptoms she presented to the [facility] emergency department for evaluation  Pt admitted with facial weakness.   Reviewed I/O's: +20 ml x 24 hours  UOP: 300 ml x 24 hours  Per neurology notes, MRI scan negative for acute stroke.   Pt awaiting SLP evaluation.  Spoke with pt via phone, who reports being very tired due to not sleeping well last night. Pt expressed concern over ongoing poor  appetite due to difficulty swallowing. She estimates that her intake has decreased over the past two months secondary to decreased appetite and "food getting stuck and coming right back up". Pt attempts to consume 2 meals per day PTA (Breakfast: sausage or ham biscuit, Lunch: chinese food Or meat, starch, and vegetable from [employer]). Pt shares that she will consume one or two bites of each food item and "then it comes right back up". She reports this happens with all food items, however, meats and tougher textured foods tend to cause the most problems (however, this also happens to softer texture foods like cranberry sauce and mashed potatoes and even liquids such as water). Intake is also inhibited by rt arm numbness, which impacts ability to prepare food (pt shares it takes great effort to open a packet of oatmeal).   Pt was able to consume 100% of jello and vanilla pudding yesterday, but did not eat breakfast secondary to fatigue.    Pt reports extreme concern over weight loss. Her UBW is around 232# per her report, however, she does not recall the last time she weighed this amount. She has progressively lost weight at least over the past year and confirms that she feels she has lost significantly more weight since swallowing issues started occurring. Noted pt has experienced a 13.8% wt loss over the past 3 months, which is significant for time frame.  Discussed with pt importance of good meal and supplement intake to promote healing. Pt amenable to Ensure supplements,  as she occasionally consumes these at home. Pt aware of pending SLP evaluation.   Medications reviewed and include marinol, actos, and remeron.   Labs reviewed.   Diet Order:   Diet Order            Diet full liquid Room service appropriate? Yes; Fluid consistency: Thin  Diet effective now              EDUCATION NEEDS:   Education needs have been addressed  Skin:  Skin Assessment: Reviewed RN Assessment  Last BM:   09/13/19  Height:   Ht Readings from Last 1 Encounters:  09/04/19 5\' 3"  (1.6 m)    Weight:   Wt Readings from Last 1 Encounters:  09/12/19 69.5 kg    Ideal Body Weight:  52.3 kg  BMI:  Body mass index is 27.14 kg/m.  Estimated Nutritional Needs:   Kcal:  1850-2050  Protein:  90-105 grams  Fluid:  > 1.8 L    Maiana Hennigan A. Jimmye Norman, RD, LDN, [place] Registered Dietitian II Certified Diabetes Care and Education Specialist Pager: [phone] After hours Pager: [phone]

## 2019-09-14 NOTE — Evaluation (Addendum)
Clinical/Bedside Swallow Evaluation Patient Details  Name: [name] [name] MRN: [id number] Date of Birth: Jan 21, 1941  Today's Date: 09/14/2019 Time: SLP Start Time (ACUTE ONLY): 73 SLP Stop Time (ACUTE ONLY): 1120 SLP Time Calculation (min) (ACUTE ONLY): 30 min  Past Medical History:  Past Medical History:  Diagnosis Date  . Anxiety    hx of  . Asthma   . Breast cancer ([place])    2002; metastatic in 2012, right breast, spread to left hip in 2012  . Cerebrovascular accident [facility])    R thalamic CVA 01/2010  . Chemotherapy induced neutropenia ([place]) 08/15/2017  . Dyslipidemia   . Encounter for therapeutic drug monitoring 04/14/2017  . Goals of care, counseling/discussion 04/19/2017  . Hyperlipemia   . Hypertension   . Low back pain   . Osteoarthritis   . TIA (transient ischemic attack)    x2 in 2011  . Vertigo 2014   Past Surgical History:  Past Surgical History:  Procedure Laterality Date  . ABDOMINAL HYSTERECTOMY     complete  . BREAST LUMPECTOMY     Right breast 2002  . JOINT REPLACEMENT  2012   L hip due to breast ca met  . TOTAL HIP ARTHROPLASTY Right 10/28/2015   Procedure: RIGHT TOTAL HIP ARTHROPLASTY ANTERIOR APPROACH;  Surgeon: Paralee Cancel, MD;  Location: [facility] ORS;  Service: Orthopedics;  Laterality: Right;   HPI:  78yo female admitted 09/12/2019 with right facial weakness, swallowing difficulty, inability to keep food down. PMH: metastatic cancer (liver, lungs, bone), DVT, CVA, dyslipidemia, HTN, TIA x2 (2011). MRI = No acute intracranial abnormality or metastatic disease. Old thalamic and pontine small vessel infarcts. Generalized atrophy and moderate chronic small vessel disease.   Assessment / Plan / Recommendation Clinical Impression  Pt presents with adequate oral motor strength and function. She has upper and lower dentures. Pt reports globus sensation with large pills, but not solids because she "chews them up real good". Primary esophageal dysphagia is  suspected. No overt s/s aspiration observed on any consistency tested. However, several minutes after po trials had discontinued, pt exhibited vomiting of watery material. RN notified.  Recommend advance to dys2/thin, crush meds. Also recommend consideration of a regular barium swallow to evaluate esophageal motiity. RN and MD informed of results and recommendations.    SLP Visit Diagnosis: Dysphagia, unspecified (R13.10)    Aspiration Risk  Mild aspiration risk    Diet Recommendation Dysphagia 2 (Fine chop);Thin liquid   Liquid Administration via: Straw;Cup Medication Administration: Crushed with puree Compensations: Small sips/bites;Slow rate;Follow solids with liquid(begin meal with warm beverage) Postural Changes: Seated upright at 90 degrees;Remain upright for at least 30 minutes after po intake    Other  Recommendations Recommended Consults: Consider esophageal assessment Oral Care Recommendations: Oral care BID(clean dentures daily as well)   Follow up Recommendations None      Frequency and Duration min 1 x/week  1 week       Prognosis Prognosis for Safe Diet Advancement: [place] Date of Onset: 09/12/19 HPI: 78yo female admitted 09/12/2019 with right facial weakness, swallowing difficulty, inability to keep food down. PMH: metastatic cancer (liver, lungs, bone), DVT, CVA, dyslipidemia, HTN, TIA x2 (2011). MRI = No acute intracranial abnormality or metastatic disease. Old thalamic and pontine small vessel infarcts. Generalized atrophy and moderate chronic small vessel disease. Type of Study: Bedside Swallow Evaluation Previous Swallow Assessment: none Diet Prior to this Study: Thin liquids(full liquid) Temperature Spikes Noted: No  Respiratory Status: Room air History of Recent Intubation: No Behavior/Cognition: Alert;Cooperative Oral Cavity Assessment: Within Functional Limits Oral Care Completed by SLP: No Oral Cavity - Dentition:  Dentures, top;Dentures, bottom Vision: Functional for self-feeding Self-Feeding Abilities: Able to feed self Patient Positioning: Upright in bed Baseline Vocal Quality: Normal Volitional Swallow: Able to elicit    Oral/Motor/Sensory Function Overall Oral Motor/Sensory Function: Within functional limits   Ice Chips Ice chips: Not tested   Thin Liquid Thin Liquid: Within functional limits Presentation: Straw    Nectar Thick Nectar Thick Liquid: Not tested   Honey Thick Honey Thick Liquid: Not tested   Puree Puree: Within functional limits Presentation: Spoon   Solid     Solid: Within functional limits Presentation: [place], [place], [place] Pathologist Office: [phone] Pager: [phone]  Shonna Chock 09/14/2019,11:31 AM

## 2019-09-14 NOTE — Progress Notes (Signed)
MD Marylyn Ishihara notified about pt 21 beat run of SVT with HR reaching into the 150s. Pt was asymptomatic with HR returning to 88 BPM. Nurse will continue to monitor.

## 2019-09-14 NOTE — TOC Initial Note (Signed)
Transition of Care (TOC) - Initial/Assessment Note    Patient Details  Name: [name] [name] MRN: [id number] Date of Birth: 03/18/1941  Transition of Care (TOC) CM/SW Contact:    Kelli F Willard, RN Phone Number: 09/14/2019, 3:50 PM  Clinical Narrative:                 Pt lives home alone and uses [employer] for transportation. She has a cane and shower seat.  Recommendations are for SNF or 24 hour supervision. CM met with the patient and then spoke to son and grandson on the phone. The family is trying to come up with 24 hours coverage for her at home. Son was agreeable to having her faxed out for SNF in [place] as a back up. Patient was also agreeable.  PASAR went to manual review.  ToC following.  Expected Discharge Plan: Skilled Nursing Facility Barriers to Discharge: Continued Medical Work up   Patient Goals and CMS Choice   CMS Medicare.gov Compare Post Acute Care list provided to:: Patient Represenative (must comment) Choice offered to / list presented to : Adult Children  Expected Discharge Plan and Services Expected Discharge Plan: Skilled Nursing Facility     Post Acute Care Choice: Skilled Nursing Facility Living arrangements for the past 2 months: Apartment                                      Prior Living Arrangements/Services Living arrangements for the past 2 months: Apartment Lives with:: Self Patient language and need for interpreter reviewed:: Yes(no needs) Do you feel safe going back to the place where you live?: No   not by herself  Need for Family Participation in Patient Care: Yes (Comment)(24 hour care) Care giver support system in place?: No (comment)(family trying to pull together the 24 hour supervision)   Criminal Activity/Legal Involvement Pertinent to Current Situation/Hospitalization: No - Comment as needed  Activities of Daily Living Home Assistive Devices/Equipment: Eyeglasses, Walker (specify type) ADL Screening (condition at  time of admission) Patient's cognitive ability adequate to safely complete daily activities?: Yes Is the patient deaf or have difficulty hearing?: No Does the patient have difficulty seeing, even when wearing glasses/contacts?: No Does the patient have difficulty concentrating, remembering, or making decisions?: No Patient able to express need for assistance with ADLs?: Yes Does the patient have difficulty dressing or bathing?: No Independently performs ADLs?: Yes (appropriate for developmental age) Does the patient have difficulty walking or climbing stairs?: Yes Weakness of Legs: Both Weakness of Arms/Hands: Right  Permission Sought/Granted                  Emotional Assessment Appearance:: Appears stated age Attitude/Demeanor/Rapport: Engaged Affect (typically observed): Accepting Orientation: : Oriented to Self, Oriented to Place, Oriented to  Time, Oriented to Situation   Psych Involvement: No (comment)  Admission diagnosis:  Shortness of breath [R06.02] Tachypnea [R06.82] Tachycardia [R00.0] Chest pain, unspecified type [R07.9] Patient Active Problem List   Diagnosis Date Noted  . Protein-calorie malnutrition, severe 09/14/2019  . Chest pain 09/12/2019  . Facial weakness 09/12/2019  . Weight loss 06/07/2019  . FTT (failure to thrive) in adult 06/07/2019  . Secondary malignant neoplasm of axillary node ([facility]) 01/15/2019  . DVT (deep venous thrombosis) ([facility]) 12/20/2018  . Hip pain 10/09/2018  . Hand weakness 08/21/2018  . Ingrowing toenail 04/19/2018  . Stress 12/20/2017  . Chemotherapy induced neutropenia (  [place]) 08/15/2017  . Goals of care, counseling/discussion 04/19/2017  . Encounter for therapeutic drug monitoring 04/14/2017  . Arm pain, anterior, left 03/08/2017  . Dry skin dermatitis 11/29/2016  . Diarrhea 11/12/2016  . Left shoulder pain 08/26/2016  . Port catheter in place 07/20/2016  . Rash and nonspecific skin eruption 04/13/2016  . Leg swelling  03/02/2016  . Obese 10/29/2015  . S/P right THA, AA 10/28/2015  . Full code status 08/06/2015  . Knee pain, right 07/28/2015  . Osteoarthritis of right lower extremity 09/17/2014  . Cyst of lateral meniscus 08/27/2014  . Cyst of lateral meniscus of right knee 08/27/2014  . Benign paroxysmal positional vertigo 07/12/2014  . Hypokalemia 07/06/2013  . Knee pain, right anterior 04/04/2013  . Creatinine elevation 04/04/2013  . Dyspnea 05/12/2012  . GERD (gastroesophageal reflux disease) 05/12/2012  . Secondary diabetes mellitus with renal manifestations ([place]) 02/11/2012  . Breast cancer metastasized to bone, right ([place]) 04/23/2011  . Edema 04/23/2011  . Situational anxiety 11/03/2010  . Osteoarthritis 10/21/2010  . Low back pain 10/21/2010  . HIP PAIN 10/07/2010  . NAUSEA AND VOMITING 08/26/2010  . HYPERLIPIDEMIA 02/18/2010  . CEREBROVASCULAR ACCIDENT, HX OF 02/18/2010  . WEIGHT GAIN, ABNORMAL 12/19/2009  . LEG PAIN, RIGHT 04/16/2009  . Vitamin D deficiency 03/07/2009  . Hidradenitis 04/12/2008  . FATIGUE 10/03/2007  . 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 Essential hypertension 10/02/2007  . Asthma 10/02/2007   PCP:  Cassandria Anger, MD Pharmacy:   [place], [place] - [address] [place] [address] Phone: [phone] Fax: [place], [place] [address] Phone: [phone] Fax: [phone]  [id number] [place] [address] [address] [address]Phone: [phone]: [phone]    Social Determinants of Health (SDOH) Interventions    Readmission Risk Interventions No flowsheet data found.

## 2019-09-14 NOTE — Progress Notes (Signed)
[name] [name]  PROGRESS NOTE    LARON ANGELINI  WUX:[id number] DOB: 1941-09-24 DOA: 09/12/2019 PCP: Cassandria Anger, MD   Brief Narrative:   78 year old female with history of recurrent widely metastatic breast cancer on chemotherapy, with metastasis to lungs, bones and liver,, history of palliative radiation to the neck and subclavian area, chronic right arm weakness and stiffness, hypertension, dyslipidemia, osteoarthritis, history of DVT on Eliquis presented to the emergency room yesterday with sudden onset right face numbness, and some left leg numbness, patient has numerous complaints also reports ongoing dyspnea on exertion, chronic back pain, some difficulty swallowing as well.  11/13: lethargic today. Otherwise no acute events.   Assessment & Plan:   Active Problems:   Essential hypertension   Situational anxiety   Breast cancer metastasized to bone, right (HCC)   Chest pain   Facial weakness   Protein-calorie malnutrition, severe   Transient right facial weakness, left leg weakness     - facial weaknes and LLE weakness have resolved     - MRI negative for acute ischemic events or metastasis     - PT/OT rec SNF, family is trying to come up with alternatives, FL2 sent     - SLP evaluation: Dys 2 diet w/ thin liquids     - TTE: EF 60 to 65%     - Resumed apixaban, she was originally on this for right arm DVT  Bilateral pleural effusion, dyspnea on exertion     - Could be malignant effusions, has widely metastatic pulmonary disease     - echo EF 60-65%, trivial pericardial effusion; on RA     - stable small-to-moderated R pleural effusion; left pleural effusion is small; no PNA seen on CT, but stable b/l nodules are noted  Recurrent widely metastatic breast cancer     - Prognosis is poor, anticipate she will need a goals of care discussion in the near future, followed by Dr. Lindi Adie, at this time insists on full code and full scope of treatment     - Receiving palliative  chemotherapy with Keytruda infusions every 3 weeks, last treatment was 11/3  Stage 3-4 chronic kidney disease     - Baseline creatinine around 2-2.4     - This is stable, hold ARB at this time, monitor with diuresis     - can stop lasix after tonight's dose  Chronic right arm DVT      - chronic right arm weakness and stiffness,     - Resumed Eliquis   Hypertension     - Stable, ARB on hold     - BP ok  DVT prophylaxis: eliquis Code Status: FULL   Disposition Plan: TBD  Consultants:   Neurology  ROS:  Reports tiredness . Remainder 10-pt ROS is negative for all not previously mentioned.  Subjective: "I can't seem to wake up today."  Objective: Vitals:   09/14/19 0002 09/14/19 0419 09/14/19 0811 09/14/19 1148  BP: 129/68 106/71 120/68 118/68  Pulse: 85 89 88 88  Resp: 16 16 16 17   Temp: (!) 97.4 F (36.3 C) (!) 97.5 F (36.4 C) 97.7 F (36.5 C) 98.3 F (36.8 C)  TempSrc: Oral Oral Oral Oral  SpO2: 95% 98% 98% 96%  Weight:        Intake/Output Summary (Last 24 hours) at 09/14/2019 1556 Last data filed at 09/14/2019 1110 Gross per 24 hour  Intake 240 ml  Output 100 ml  Net 140 ml   [employer]  09/12/19 1432  Weight: 69.5 kg    Examination:  General: 78 y.o. female resting in bed in NAD Cardiovascular: RRR, +S1, S2, no m/g/r Respiratory: clear but decreased at bases normal WOB GI: BS+, NDNT, no masses noted, no organomegaly noted MSK: No e/c/c Neuro: alert to name, follows commands   Data Reviewed: I have personally reviewed following labs and imaging studies.  CBC: Recent Labs  Lab 09/12/19 1358 09/12/19 1404 09/13/19 1146  WBC 9.2  --  9.3  NEUTROABS 5.8  --   --   HGB 11.5* 12.9 10.8*  HCT 36.6 38.0 34.1*  MCV 91.3  --  90.2  PLT 301  --  326   Basic Metabolic Panel: Recent Labs  Lab 09/12/19 1358 09/12/19 1404 09/13/19 1146 09/14/19 1300  NA 139 139 137  --   K 4.0 4.0 4.2  --   CL 106 107 103  --   CO2 16*  --  20*  --    GLUCOSE 113* 106* 110*  --   BUN 22 27* 24*  --   CREATININE 2.36* 2.10* 2.32*  --   CALCIUM 9.2  --  9.2  --   MG  --   --   --  2.6*   GFR: Estimated Creatinine Clearance: 19.3 mL/min (A) (by C-G formula based on SCr of 2.32 mg/dL (H)). Liver Function Tests: Recent Labs  Lab 09/12/19 1358  AST 76*  ALT 17  ALKPHOS 141*  BILITOT 1.0  PROT 7.9  ALBUMIN 2.9*   No results for input(s): LIPASE, AMYLASE in the last 168 hours. No results for input(s): AMMONIA in the last 168 hours. Coagulation Profile: Recent Labs  Lab 09/12/19 1358  INR 1.2   Cardiac Enzymes: No results for input(s): CKTOTAL, CKMB, CKMBINDEX, TROPONINI in the last 168 hours. BNP (last 3 results) No results for input(s): PROBNP in the last 8760 hours. HbA1C: No results for input(s): HGBA1C in the last 72 hours. CBG: No results for input(s): GLUCAP in the last 168 hours. Lipid Profile: No results for input(s): CHOL, HDL, LDLCALC, TRIG, CHOLHDL, LDLDIRECT in the last 72 hours. Thyroid Function Tests: No results for input(s): TSH, T4TOTAL, FREET4, T3FREE, THYROIDAB in the last 72 hours. Anemia Panel: No results for input(s): VITAMINB12, FOLATE, FERRITIN, TIBC, IRON, RETICCTPCT in the last 72 hours. Sepsis Labs: No results for input(s): PROCALCITON, LATICACIDVEN in the last 168 hours.  Recent Results (from the past 240 hour(s))  SARS CORONAVIRUS 2 (TAT 6-24 HRS) Nasopharyngeal Nasopharyngeal Swab     Status: None   Collection Time: 09/12/19  6:52 PM   Specimen: Nasopharyngeal Swab  Result Value Ref Range Status   SARS Coronavirus 2 NEGATIVE NEGATIVE Final    Comment: (NOTE) SARS-CoV-2 target nucleic acids are NOT DETECTED. The SARS-CoV-2 RNA is generally detectable in upper and lower respiratory specimens during the acute phase of infection. Negative results do not preclude SARS-CoV-2 infection, do not rule out co-infections with other pathogens, and should not be used as the sole basis for treatment  or other patient management decisions. Negative results must be combined with clinical observations, patient history, and epidemiological information. The expected result is Negative. Fact Sheet for Patients: SugarRoll.be Fact Sheet for Healthcare Providers: https://www.woods-mathews.com/ This test is not yet approved or cleared by the [place] FDA and  has been authorized for detection and/or diagnosis of SARS-CoV-2 by FDA under an Emergency Use Authorization (EUA). This EUA will remain  in effect (meaning this test can be used) for  the duration of the COVID-19 declaration under Section 56 4(b)(1) of the Act, 21 U.S.C. section 360bbb-3(b)(1), unless the authorization is terminated or revoked sooner. Performed at [facility], [place] [address], [place], [place] [address]       Radiology Studies: Ct Chest Wo Contrast  Result Date: 09/12/2019 CLINICAL DATA:  Chest pain. Shortness of breath. Metastatic breast cancer. EXAM: CT CHEST WITHOUT CONTRAST TECHNIQUE: Multidetector CT imaging of the chest was performed following the standard protocol without IV contrast. COMPARISON:  08/10/2019 FINDINGS: Cardiovascular: The heart size is normal. No substantial pericardial effusion. Coronary artery calcification is evident. Atherosclerotic calcification is noted in the wall of the thoracic aorta. Prominence of the main pulmonary arteries suggests pulmonary arterial hypertension. Similar appearance of pericardial nodules. Index nodule measured previously at 7 x 11 mm is 8 x 12 mm today. Right Port-A-Cath tip is in the distal SVC. Mediastinum/Nodes: No mediastinal lymphadenopathy. No evidence for gross hilar lymphadenopathy although assessment is limited by the lack of intravenous contrast on today's study. The esophagus has normal imaging features. There is no axillary lymphadenopathy. Lungs/Pleura: Stable pleuroparenchymal scarring in the right apex,  likely radiation fibrosis. Bilateral pulmonary nodules again noted. Posterior left upper lobe nodule measuring 13 mm today was 12 mm when remeasured in a similar fashion on the prior study. 15 mm anterior left lung nodule (51/5) was 14 mm when measured in a similar fashion on the prior study. 2 cm right infrahilar lower lobe nodule (86/5) was 2 cm previously. Other scattered pulmonary nodules are similar to prior. Small to moderate right pleural effusion is similar. A small left pleural effusion has progressed in the interval. Upper Abdomen: Heterogeneous liver parenchyma compatible with the patient's known metastatic disease, not well demonstrated on noncontrast imaging today. Left adrenal lesion incompletely visualized but approximately 2.7 x 1.1 cm today compared to 2.7 x 1.1 cm previously. Musculoskeletal: Widespread sclerotic bony metastases are similar. 11 mm sclerotic lesion posterior T12 vertebral body on today's study was 11 mm when remeasured in a similar fashion on the prior exam. IMPRESSION: 1. Bilateral pulmonary nodules are stable to minimally progressed in the short interval since prior study. 2. Small to moderate right pleural effusion is stable. A small left pleural effusion on today's study has increased in the interval since prior exam. 3. Similar appearance of pericardial nodules consistent with metastatic disease. 4. Marked heterogeneity of liver parenchyma consistent with bulky metastatic involvement. This is not well demonstrated on today's noncontrast exam. 5. Widespread bony metastatic disease, similar to prior. Electronically Signed   By: Misty [place] M.D.   On: 09/12/2019 18:31   Mr Brain Wo Contrast  Result Date: 09/13/2019 CLINICAL DATA:  Right face and arm numbness. History of metastatic breast cancer. EXAM: MRI HEAD WITHOUT CONTRAST TECHNIQUE: Multiplanar, multiecho pulse sequences of the brain and surrounding structures were obtained without intravenous contrast. COMPARISON:   Brain MRI 06/29/2014 FINDINGS: BRAIN: There is no acute infarct, acute hemorrhage or extra-axial collection. Multifocal white matter hyperintensity, most commonly due to chronic ischemic microangiopathy. There are old thalamic and pontine small vessel infarcts. There is generalized atrophy without lobar predilection. The midline structures are normal. VASCULAR: The major intracranial arterial and venous sinus flow voids are normal. Susceptibility-sensitive sequences show no chronic microhemorrhage or superficial siderosis. SKULL AND UPPER CERVICAL SPINE: Calvarial bone marrow signal is normal. There is no skull base mass. The visualized upper cervical spine and soft tissues are normal. SINUSES/ORBITS: There are no fluid levels or advanced mucosal thickening.  The mastoid air cells and middle ear cavities are free of fluid. The orbits are normal. IMPRESSION: 1. No acute intracranial abnormality or metastatic disease. 2. Old thalamic and pontine small vessel infarcts. 3. Generalized atrophy and moderate chronic small vessel disease. Electronically Signed   By: Ulyses Jarred M.D.   On: 09/13/2019 02:45   Nm Pulmonary Perfusion  Result Date: 09/12/2019 CLINICAL DATA:  Breast cancer, history pulmonary embolism, acute onset of RIGHT upper extremity weakness and facial paresthesias today EXAM: NUCLEAR MEDICINE PERFUSION LUNG SCAN TECHNIQUE: Perfusion images were obtained in multiple projections after intravenous injection of radiopharmaceutical. Ventilation scans intentionally deferred if perfusion scan and chest x-ray adequate for interpretation during COVID 19 epidemic. RADIOPHARMACEUTICALS:  1.5 mCi Tc-61m MAA IV COMPARISON:  None FINDINGS: Diminished perfusion at the lung bases greater posteriorly. Patient has bibasilar atelectasis and small LEFT pleural effusion on radiograph. No additional segmental or subsegmental perfusion defects are identified. Ventilation exam was not performed, unknown COVID status.  Findings represent a low probability for pulmonary embolism. IMPRESSION: Mildly diminished perfusion at the lung bases especially posteriorly, less significant than suspected from the bibasilar atelectasis and LEFT pleural effusion seen on chest radiograph. Low probability of pulmonary embolism. Electronically Signed   By: Lavonia Dana M.D.   On: 09/12/2019 18:00     Scheduled Meds:  amLODipine  10 mg Oral Daily   apixaban  5 mg Oral BID   Chlorhexidine Gluconate Cloth  6 each Topical Daily   docusate sodium  100 mg Oral BID   dronabinol  2.5 mg Oral BID AC   feeding supplement (ENSURE ENLIVE)  237 mL Oral TID BM   furosemide  20 mg Intravenous BID   gabapentin  100 mg Oral QHS   LORazepam  0.25 mg Intravenous Once   mirtazapine  7.5 mg Oral QHS   multivitamin with minerals  1 tablet Oral Daily   pioglitazone  15 mg Oral Daily   sodium chloride flush  3 mL Intravenous Once   traMADol  50 mg Oral BID   Continuous Infusions:   LOS: 1 day    Time spent: 25 minutes spent in the coordination of care today.    Jonnie Finner, DO [facility] Pager [phone]  If 7PM-7AM, please contact night-coverage [email or web address] Password [facility] 09/14/2019, 3:56 PM

## 2019-09-15 DIAGNOSIS — R5381 Other malaise: Secondary | ICD-10-CM

## 2019-09-15 DIAGNOSIS — R1310 Dysphagia, unspecified: Secondary | ICD-10-CM

## 2019-09-15 DIAGNOSIS — F418 Other specified anxiety disorders: Secondary | ICD-10-CM

## 2019-09-15 LAB — CBC WITH DIFFERENTIAL/PLATELET
Abs Immature Granulocytes: 0.1 10*3/uL — ABNORMAL HIGH (ref 0.00–0.07)
Basophils Absolute: 0.1 10*3/uL (ref 0.0–0.1)
Basophils Relative: 1 %
Eosinophils Absolute: 0.1 10*3/uL (ref 0.0–0.5)
Eosinophils Relative: 1 %
HCT: 32.1 % — ABNORMAL LOW (ref 36.0–46.0)
Hemoglobin: 10.2 g/dL — ABNORMAL LOW (ref 12.0–15.0)
Immature Granulocytes: 1 %
Lymphocytes Relative: 11 %
Lymphs Abs: 1 10*3/uL (ref 0.7–4.0)
MCH: 28.5 pg (ref 26.0–34.0)
MCHC: 31.8 g/dL (ref 30.0–36.0)
MCV: 89.7 fL (ref 80.0–100.0)
Monocytes Absolute: 1.6 10*3/uL — ABNORMAL HIGH (ref 0.1–1.0)
Monocytes Relative: 18 %
Neutro Abs: 6 10*3/uL (ref 1.7–7.7)
Neutrophils Relative %: 68 %
Platelets: 280 10*3/uL (ref 150–400)
RBC: 3.58 MIL/uL — ABNORMAL LOW (ref 3.87–5.11)
RDW: 17 % — ABNORMAL HIGH (ref 11.5–15.5)
WBC: 8.8 10*3/uL (ref 4.0–10.5)
nRBC: 0 % (ref 0.0–0.2)

## 2019-09-15 LAB — RENAL FUNCTION PANEL
Albumin: 2.6 g/dL — ABNORMAL LOW (ref 3.5–5.0)
Anion gap: 9 (ref 5–15)
BUN: 28 mg/dL — ABNORMAL HIGH (ref 8–23)
CO2: 22 mmol/L (ref 22–32)
Calcium: 9 mg/dL (ref 8.9–10.3)
Chloride: 103 mmol/L (ref 98–111)
Creatinine, Ser: 2.95 mg/dL — ABNORMAL HIGH (ref 0.44–1.00)
GFR calc Af Amer: 17 mL/min — ABNORMAL LOW (ref >60)
GFR calc non Af Amer: 15 mL/min — ABNORMAL LOW (ref >60)
Glucose, Bld: 118 mg/dL — ABNORMAL HIGH (ref 70–99)
Phosphorus: 3.7 mg/dL (ref 2.5–4.6)
Potassium: 4.3 mmol/L (ref 3.5–5.1)
Sodium: 134 mmol/L — ABNORMAL LOW (ref 135–145)

## 2019-09-15 NOTE — Progress Notes (Signed)
Marland [facility]  PROGRESS NOTE    [name] [name]  ZOX:[id number] DOB: Apr 04, 1941 DOA: 09/12/2019 PCP: Cassandria Anger, MD   Brief Narrative:   78 year old female with history of recurrent widely metastatic breast cancer on chemotherapy, with metastasis to lungs, bones and liver,, history of palliative radiation to the neck and subclavian area, chronic right arm weakness and stiffness, hypertension, dyslipidemia, osteoarthritis, history of DVT on Eliquis presented to the emergency room yesterday with sudden onset right face numbness, and some left leg numbness,patient has numerous complaints also reports ongoing dyspnea on exertion, chronic back pain, some difficulty swallowing as well.  11/13: lethargic today. Otherwise no acute events. 11/14: Much more alert and anxious today. Problem w/ swallowing pills this AM. SLP to see  Assessment & Plan:   Active Problems:   Essential hypertension   Situational anxiety   Breast cancer metastasized to bone, right (HCC)   Chest pain   Facial weakness   Protein-calorie malnutrition, severe  Transient right facial weakness, left leg weakness Debility     - facial weaknes and LLE weakness have resolved     - MRI negative for acute ischemic events or metastasis     - PT/OT rec SNF vs home w/ 24h supervision, family is trying to come up with 24h coverage, FL2 sent     - SLP evaluation: Dys 2 diet w/ thin liquids     - TTE: EF 60 to 65%     - Resumed apixaban, she was originally on this for right arm DVT  Bilateral pleural effusion, dyspnea on exertion     - Could be malignant effusions, has widely metastatic pulmonary disease     - echo EF 60-65%, trivial pericardial effusion; comfortable on RA     - stable small-to-moderated R pleural effusion; left pleural effusion is small; no PNA seen on CT, but stable b/l nodules are noted  Recurrent widely metastatic breast cancer     - Prognosis is poor, anticipate she will need a goals of care discussion in  the near future, followed by Dr. Lindi Adie, at this time insists on full code and full scope of treatment     - Receiving palliative chemotherapy with Keytruda infusions every 3 weeks, last treatment was 11/3  Stage 3-4 chronic kidney disease     - Baseline creatinine around 2-2.4     - This is stable, hold ARB at this time, monitor with diuresis     - can stop lasix after tonight's dose  Chronic right arm DVT      - chronic right arm weakness and stiffness,     - Resumed Eliquis   Hypertension     - Stable, ARB on hold     - BP ok  Dysphagia/Globus sensation     - problems with pill swallowing this AM.     - Good cough this morning     - SLP to see     - currently on Dys 2 diet, thin liquids, meds crushed in puree  DVT prophylaxis: eliquis Code Status: FULL  Disposition Plan: TBD  Consultants:   Neurology  ROS:  Reports difficulty swallowing. Denies CP, dyspnea, palpitations . Remainder 10-pt ROS is negative for all not previously mentioned.  Subjective: "My voice doesn't normally sound like this."  Objective: Vitals:   09/14/19 1617 09/14/19 1935 09/14/19 2322 09/15/19 0328  BP: 108/69 115/63 127/71 110/61  Pulse: 89 88 93 90  Resp: 16 18 18 18   Temp: (!) 97.5 F (  36.4 C) (!) 97.5 F (36.4 C) 97.9 F (36.6 C) 98.4 F (36.9 C)  TempSrc: Axillary Oral Oral Oral  SpO2: 96% 99% 97% 96%  Weight:        Intake/Output Summary (Last 24 hours) at 09/15/2019 1540 Last data filed at 09/14/2019 1110 Gross per 24 hour  Intake 240 ml  Output 100 ml  Net 140 ml   [employer]   09/12/19 1432  Weight: 69.5 kg    Examination:  General: 78 y.o. female resting in bed, eating Cardiovascular: RRR, +S1, S2, no m/g/r, equal pulses throughout Respiratory: CTABL, no w/r/r, normal WOB GI: BS+, NDNT, soft MSK: No e/c/c Neuro: Alert to name, follows commands Psyc: cooperative but somewhat anxious   Data Reviewed: I have personally reviewed following labs and imaging  studies.  CBC: Recent Labs  Lab 09/12/19 1358 09/12/19 1404 09/13/19 1146  WBC 9.2  --  9.3  NEUTROABS 5.8  --   --   HGB 11.5* 12.9 10.8*  HCT 36.6 38.0 34.1*  MCV 91.3  --  90.2  PLT 301  --  086   Basic Metabolic Panel: Recent Labs  Lab 09/12/19 1358 09/12/19 1404 09/13/19 1146 09/14/19 1300  NA 139 139 137  --   K 4.0 4.0 4.2  --   CL 106 107 103  --   CO2 16*  --  20*  --   GLUCOSE 113* 106* 110*  --   BUN 22 27* 24*  --   CREATININE 2.36* 2.10* 2.32*  --   CALCIUM 9.2  --  9.2  --   MG  --   --   --  2.6*   GFR: Estimated Creatinine Clearance: 19.3 mL/min (A) (by C-G formula based on SCr of 2.32 mg/dL (H)). Liver Function Tests: Recent Labs  Lab 09/12/19 1358  AST 76*  ALT 17  ALKPHOS 141*  BILITOT 1.0  PROT 7.9  ALBUMIN 2.9*   No results for input(s): LIPASE, AMYLASE in the last 168 hours. No results for input(s): AMMONIA in the last 168 hours. Coagulation Profile: Recent Labs  Lab 09/12/19 1358  INR 1.2   Cardiac Enzymes: No results for input(s): CKTOTAL, CKMB, CKMBINDEX, TROPONINI in the last 168 hours. BNP (last 3 results) No results for input(s): PROBNP in the last 8760 hours. HbA1C: No results for input(s): HGBA1C in the last 72 hours. CBG: No results for input(s): GLUCAP in the last 168 hours. Lipid Profile: No results for input(s): CHOL, HDL, LDLCALC, TRIG, CHOLHDL, LDLDIRECT in the last 72 hours. Thyroid Function Tests: No results for input(s): TSH, T4TOTAL, FREET4, T3FREE, THYROIDAB in the last 72 hours. Anemia Panel: No results for input(s): VITAMINB12, FOLATE, FERRITIN, TIBC, IRON, RETICCTPCT in the last 72 hours. Sepsis Labs: No results for input(s): PROCALCITON, LATICACIDVEN in the last 168 hours.  Recent Results (from the past 240 hour(s))  SARS CORONAVIRUS 2 (TAT 6-24 HRS) Nasopharyngeal Nasopharyngeal Swab     Status: None   Collection Time: 09/12/19  6:52 PM   Specimen: Nasopharyngeal Swab  Result Value Ref Range Status    SARS Coronavirus 2 NEGATIVE NEGATIVE Final    Comment: (NOTE) SARS-CoV-2 target nucleic acids are NOT DETECTED. The SARS-CoV-2 RNA is generally detectable in upper and lower respiratory specimens during the acute phase of infection. Negative results do not preclude SARS-CoV-2 infection, do not rule out co-infections with other pathogens, and should not be used as the sole basis for treatment or other patient management decisions. Negative results must  be combined with clinical observations, patient history, and epidemiological information. The expected result is Negative. Fact Sheet for Patients: SugarRoll.be Fact Sheet for Healthcare Providers: https://www.woods-mathews.com/ This test is not yet approved or cleared by the [place] FDA and  has been authorized for detection and/or diagnosis of SARS-CoV-2 by FDA under an Emergency Use Authorization (EUA). This EUA will remain  in effect (meaning this test can be used) for the duration of the COVID-19 declaration under Section 56 4(b)(1) of the Act, 21 U.S.C. section 360bbb-3(b)(1), unless the authorization is terminated or revoked sooner. Performed at [facility], [place] [address], [place], [place] [address]       Radiology Studies: No results found.   Scheduled Meds: . amLODipine  10 mg Oral Daily  . apixaban  5 mg Oral BID  . Chlorhexidine Gluconate Cloth  6 each Topical Daily  . docusate sodium  100 mg Oral BID  . dronabinol  2.5 mg Oral BID AC  . feeding supplement (ENSURE ENLIVE)  237 mL Oral TID BM  . gabapentin  100 mg Oral QHS  . LORazepam  0.25 mg Intravenous Once  . mirtazapine  7.5 mg Oral QHS  . multivitamin with minerals  1 tablet Oral Daily  . pioglitazone  15 mg Oral Daily  . sodium chloride flush  3 mL Intravenous Once  . traMADol  50 mg Oral BID   Continuous Infusions:   LOS: 2 days    Time spent: 25 minutes spent in the coordination of care  today.    Jonnie Finner, DO [facility] Pager [phone]  If 7PM-7AM, please contact night-coverage [email or web address] Password [facility] 09/15/2019, 7:38 AM

## 2019-09-15 NOTE — TOC Progression Note (Signed)
Transition of Care [facility]) - Progression Note    Patient Details  Name: [name] [name] MRN: [id number] Date of Birth: [birth year]  Transition of Care [facility]) CM/SW [place], [place] Phone Number: 09/15/2019, 3:53 PM  Clinical Narrative:     CSW called and spoke with the patient's son, Cleta Heatley. CSW provided current bed offers to the son. He stated that they are still deciding as a family whether to put the patient in rehab or send her home with home health. He stated he is out of state and would speak with other family members in [place] and they would make the decision.   CSW will continue to follow and assist with disposition planning.   Expected Discharge Plan: [place] Barriers to Discharge: Continued Medical Work up  Expected Discharge Plan and Services Expected Discharge Plan: [place] Choice: [place] arrangements for the past 2 months: Apartment                                       Social Determinants of Health (SDOH) Interventions    Readmission Risk Interventions No flowsheet data found.

## 2019-09-16 LAB — CBC WITH DIFFERENTIAL/PLATELET
Abs Immature Granulocytes: 0.06 10*3/uL (ref 0.00–0.07)
Basophils Absolute: 0.1 10*3/uL (ref 0.0–0.1)
Basophils Relative: 1 %
Eosinophils Absolute: 0.1 10*3/uL (ref 0.0–0.5)
Eosinophils Relative: 1 %
HCT: 29.4 % — ABNORMAL LOW (ref 36.0–46.0)
Hemoglobin: 9.4 g/dL — ABNORMAL LOW (ref 12.0–15.0)
Immature Granulocytes: 1 %
Lymphocytes Relative: 8 %
Lymphs Abs: 0.6 10*3/uL — ABNORMAL LOW (ref 0.7–4.0)
MCH: 28.9 pg (ref 26.0–34.0)
MCHC: 32 g/dL (ref 30.0–36.0)
MCV: 90.5 fL (ref 80.0–100.0)
Monocytes Absolute: 1.1 10*3/uL — ABNORMAL HIGH (ref 0.1–1.0)
Monocytes Relative: 14 %
Neutro Abs: 5.9 10*3/uL (ref 1.7–7.7)
Neutrophils Relative %: 75 %
Platelets: 264 10*3/uL (ref 150–400)
RBC: 3.25 MIL/uL — ABNORMAL LOW (ref 3.87–5.11)
RDW: 17 % — ABNORMAL HIGH (ref 11.5–15.5)
WBC: 7.7 10*3/uL (ref 4.0–10.5)
nRBC: 0 % (ref 0.0–0.2)

## 2019-09-16 LAB — RENAL FUNCTION PANEL
Albumin: 2.3 g/dL — ABNORMAL LOW (ref 3.5–5.0)
Anion gap: 10 (ref 5–15)
BUN: 34 mg/dL — ABNORMAL HIGH (ref 8–23)
CO2: 21 mmol/L — ABNORMAL LOW (ref 22–32)
Calcium: 9.1 mg/dL (ref 8.9–10.3)
Chloride: 103 mmol/L (ref 98–111)
Creatinine, Ser: 3.23 mg/dL — ABNORMAL HIGH (ref 0.44–1.00)
GFR calc Af Amer: 15 mL/min — ABNORMAL LOW (ref >60)
GFR calc non Af Amer: 13 mL/min — ABNORMAL LOW (ref >60)
Glucose, Bld: 89 mg/dL (ref 70–99)
Phosphorus: 4 mg/dL (ref 2.5–4.6)
Potassium: 4.6 mmol/L (ref 3.5–5.1)
Sodium: 134 mmol/L — ABNORMAL LOW (ref 135–145)

## 2019-09-16 LAB — MAGNESIUM: Magnesium: 2.9 mg/dL — ABNORMAL HIGH (ref 1.7–2.4)

## 2019-09-16 MED ORDER — SODIUM CHLORIDE 0.9 % IV SOLN
Freq: Once | INTRAVENOUS | Status: AC
  Administered 2019-09-16: 11:00:00 via INTRAVENOUS

## 2019-09-16 NOTE — Progress Notes (Signed)
[facility]  PROGRESS NOTE    [name] [name]  YHC:[id number] DOB: Oct 02, 1941 DOA: 09/12/2019 PCP: Cassandria Anger, MD   Brief Narrative:   78 year old female with history of recurrent widely metastatic breast cancer on chemotherapy, with metastasis to lungs, bones and liver,, history of palliative radiation to the neck and subclavian area, chronic right arm weakness and stiffness, hypertension, dyslipidemia, osteoarthritis, history of DVT on Eliquis presented to the emergency room yesterday with sudden onset right face numbness, and some left leg numbness,patient has numerous complaints also reports ongoing dyspnea on exertion, chronic back pain, some difficulty swallowing as well.  11/13: lethargic today. Otherwise no acute events. 11/14: Much more alert and anxious today. Problem w/ swallowing pills this AM. SLP to see 11/15: Can't get barium swallow until tomorrow. Renal numbers up a bit. Otherwise, ok last night.   Assessment & Plan:   Active Problems:   Essential hypertension   Situational anxiety   Breast cancer metastasized to bone, right (HCC)   Chest pain   Facial weakness   Protein-calorie malnutrition, severe  Transient right facial weakness, left leg weakness Debility - facial weaknes and LLE weakness have resolved - MRI negative for acute ischemic events or metastasis - PT/OT rec SNF vs home w/ 24h supervision, family is trying to come up with 24h coverage, FL2 sent - SLP evaluation: Dys 2 diet w/ thin liquids - TTE: EF 60 to 65% - Resumed apixaban, she was originally on this for right arm DVT  Bilateral pleural effusion, dyspnea on exertion - Could be malignant effusions, has widely metastatic pulmonary disease - echo EF 60-65%, trivial pericardial effusion; comfortable on RA - stable small-to-moderated R pleural effusion; left pleural effusion is small; no PNA seen on CT, but stable b/l nodules are noted  Recurrent widely  metastatic breast cancer - Prognosis is poor, anticipate she will need a goals of care discussion in the near future, followed by Dr. Lindi Adie, at this time insists on full code and full scope of treatment - Receiving palliative chemotherapy with Keytruda infusions every 3 weeks, last treatment was 11/3  Stage 3-4 chronic kidney disease - Baseline creatinine around 2-2.4 - This is stable, hold ARB at this time, monitor with diuresis - SCr is up a little; add some fluids, watch I&O  Chronic right arm DVT  - chronic right arm weakness and stiffness, - Resumed Eliquis   Hypertension - Stable, ARB on hold - BP ok  Dysphagia/Globus sensation     - no complaints of globus this AM     - to have barium swallow tomorrow     - currently on Dys 2 diet, thin liquids, meds crushed in puree  Renal numbers up. Add fluids, gently for 1L. Monitor.   DVT prophylaxis: Eliquis Code Status: FULL Family Communication: Spoke with son by phone.   Disposition Plan: TBD  Consultants:   Neurology  ROS:  Denies CP, N, V, dyspnea . Remainder 10-pt ROS is negative for all not previously mentioned.  Subjective: "I'm here!"  Objective: Vitals:   09/15/19 2000 09/16/19 0000 09/16/19 0325 09/16/19 0812  BP: 111/83 108/80 121/65 127/64  Pulse: 92 90 88 95  Resp: 18 18 18 16   Temp: (!) 97.5 F (36.4 C) 97.8 F (36.6 C) 97.9 F (36.6 C) (!) 97.4 F (36.3 C)  TempSrc: Oral Oral Oral Oral  SpO2: 96% 98% 96% 96%  Weight:       No intake or output data in the 24 hours ending 09/16/19  [place]   09/23/19 1432  Weight: 69.5 kg    Examination:  General: 78 y.o. female resting in bed in NAD Cardiovascular: RRR, +S1, S2, no m/g/r Respiratory: CTABL, no w/r/r GI: BS+, NDNT, no masses noted, no organomegaly noted MSK: No e/c/c Neuro: Alert to name, follows commands   Data Reviewed: I have personally reviewed following labs and imaging studies.   CBC: Recent Labs  Lab September 23, 2019 1358 09-23-2019 1404 09/13/19 1146 09/15/19 1012 09/16/19 0700  WBC 9.2  --  9.3 8.8 7.7  NEUTROABS 5.8  --   --  6.0 5.9  HGB 11.5* 12.9 10.8* 10.2* 9.4*  HCT 36.6 38.0 34.1* 32.1* 29.4*  MCV 91.3  --  90.2 89.7 90.5  PLT 301  --  277 280 149   Basic Metabolic Panel: Recent Labs  Lab 09/23/19 1358 2019-09-23 1404 09/13/19 1146 09/14/19 1300 09/15/19 1012 09/16/19 0700  NA 139 139 137  --  134* 134*  K 4.0 4.0 4.2  --  4.3 4.6  CL 106 107 103  --  103 103  CO2 16*  --  20*  --  22 21*  GLUCOSE 113* 106* 110*  --  118* 89  BUN 22 27* 24*  --  28* 34*  CREATININE 2.36* 2.10* 2.32*  --  2.95* 3.23*  CALCIUM 9.2  --  9.2  --  9.0 9.1  MG  --   --   --  2.6*  --  2.9*  PHOS  --   --   --   --  3.7 4.0   GFR: Estimated Creatinine Clearance: 13.8 mL/min (A) (by C-G formula based on SCr of 3.23 mg/dL (H)). Liver Function Tests: Recent Labs  Lab 23-Sep-2019 1358 09/15/19 1012 09/16/19 0700  AST 76*  --   --   ALT 17  --   --   ALKPHOS 141*  --   --   BILITOT 1.0  --   --   PROT 7.9  --   --   ALBUMIN 2.9* 2.6* 2.3*   No results for input(s): LIPASE, AMYLASE in the last 168 hours. No results for input(s): AMMONIA in the last 168 hours. Coagulation Profile: Recent Labs  Lab 09-23-2019 1358  INR 1.2   Cardiac Enzymes: No results for input(s): CKTOTAL, CKMB, CKMBINDEX, TROPONINI in the last 168 hours. BNP (last 3 results) No results for input(s): PROBNP in the last 8760 hours. HbA1C: No results for input(s): HGBA1C in the last 72 hours. CBG: No results for input(s): GLUCAP in the last 168 hours. Lipid Profile: No results for input(s): CHOL, HDL, LDLCALC, TRIG, CHOLHDL, LDLDIRECT in the last 72 hours. Thyroid Function Tests: No results for input(s): TSH, T4TOTAL, FREET4, T3FREE, THYROIDAB in the last 72 hours. Anemia Panel: No results for input(s): VITAMINB12, FOLATE, FERRITIN, TIBC, IRON, RETICCTPCT in the last 72 hours. Sepsis Labs:  No results for input(s): PROCALCITON, LATICACIDVEN in the last 168 hours.  Recent Results (from the past 240 hour(s))  SARS CORONAVIRUS 2 (TAT 6-24 HRS) Nasopharyngeal Nasopharyngeal Swab     Status: None   Collection Time: 2019/09/23  6:52 PM   Specimen: Nasopharyngeal Swab  Result Value Ref Range Status   SARS Coronavirus 2 NEGATIVE NEGATIVE Final    Comment: (NOTE) SARS-CoV-2 target nucleic acids are NOT DETECTED. The SARS-CoV-2 RNA is generally detectable in upper and lower respiratory specimens during the acute phase of infection. Negative results do not preclude SARS-CoV-2 infection, do not rule out co-infections with  other pathogens, and should not be used as the sole basis for treatment or other patient management decisions. Negative results must be combined with clinical observations, patient history, and epidemiological information. The expected result is Negative. Fact Sheet for Patients: SugarRoll.be Fact Sheet for Healthcare Providers: https://www.woods-mathews.com/ This test is not yet approved or cleared by the [place] FDA and  has been authorized for detection and/or diagnosis of SARS-CoV-2 by FDA under an Emergency Use Authorization (EUA). This EUA will remain  in effect (meaning this test can be used) for the duration of the COVID-19 declaration under Section 56 4(b)(1) of the Act, 21 U.S.C. section 360bbb-3(b)(1), unless the authorization is terminated or revoked sooner. Performed at [facility], [place] [address] [place], [place] [address]       Radiology Studies: No results found.   Scheduled Meds: . amLODipine  10 mg Oral Daily  . apixaban  5 mg Oral BID  . Chlorhexidine Gluconate Cloth  6 each Topical Daily  . docusate sodium  100 mg Oral BID  . dronabinol  2.5 mg Oral BID AC  . feeding supplement (ENSURE ENLIVE)  237 mL Oral TID BM  . gabapentin  100 mg Oral QHS  . LORazepam  0.25 mg Intravenous  Once  . mirtazapine  7.5 mg Oral QHS  . multivitamin with minerals  1 tablet Oral Daily  . pioglitazone  15 mg Oral Daily  . sodium chloride flush  3 mL Intravenous Once  . traMADol  50 mg Oral BID   Continuous Infusions: . sodium chloride       LOS: 3 days    Time spent: 25 minutes spent in the coordination of care today.    Jonnie Finner, DO [facility] Pager [phone]  If 7PM-7AM, please contact night-coverage [email or web address] Password [email or web address] 09/16/2019, 10:51 AM

## 2019-09-17 ENCOUNTER — Inpatient Hospital Stay (HOSPITAL_COMMUNITY): Payer: Medicare HMO

## 2019-09-17 LAB — RENAL FUNCTION PANEL
Albumin: 2.2 g/dL — ABNORMAL LOW (ref 3.5–5.0)
Anion gap: 9 (ref 5–15)
BUN: 33 mg/dL — ABNORMAL HIGH (ref 8–23)
CO2: 21 mmol/L — ABNORMAL LOW (ref 22–32)
Calcium: 9.2 mg/dL (ref 8.9–10.3)
Chloride: 106 mmol/L (ref 98–111)
Creatinine, Ser: 2.69 mg/dL — ABNORMAL HIGH (ref 0.44–1.00)
GFR calc Af Amer: 19 mL/min — ABNORMAL LOW (ref >60)
GFR calc non Af Amer: 17 mL/min — ABNORMAL LOW (ref >60)
Glucose, Bld: 85 mg/dL (ref 70–99)
Phosphorus: 3.5 mg/dL (ref 2.5–4.6)
Potassium: 4.4 mmol/L (ref 3.5–5.1)
Sodium: 136 mmol/L (ref 135–145)

## 2019-09-17 LAB — SARS CORONAVIRUS 2 (TAT 6-24 HRS): SARS Coronavirus 2: NEGATIVE

## 2019-09-17 MED ORDER — DOCUSATE SODIUM 50 MG/5ML PO LIQD
100.0000 mg | Freq: Two times a day (BID) | ORAL | Status: DC
  Administered 2019-09-17 – 2019-09-22 (×9): 100 mg via ORAL
  Filled 2019-09-17 (×8): qty 10

## 2019-09-17 NOTE — Progress Notes (Addendum)
Occupational Therapy Treatment Patient Details Name: [name] [name] MRN: [id number] DOB: [birth year] Today's Date: 09/17/2019    History of present illness [name] [name] is a 78 y.o. female with medical history significant of cancer in 2002 treated successfully.  Currently with a recurrence with widely metastatic disease involving liver lungs bone.  She is still undergoing treatment for her cancer and had a DVT in the R UE.  She presented with R facial droop and weakness over couple of weeks and now with some speech and swallowing issues.   OT comments  Pt supine in bed and agreeable to OT intervention. Min A of 2 for bed mobility. Pt making multiple attempts to stand from standard bed height of 24 inches but unable and after bed height elevated she was able to stand with min A of 2. Pt ambulating 16' within the room with min A and RW with chair follow for safety. Pt fatigues very quickly. She sits on EOB with close supervision and set up A to grooming tasks. Pt utilized L UE for majority of grooming tasks but is able to reach up and touch face with R UE but she declines to use it. Next session to focus on more forced use. Max A of 2 to reposition in bed at end of session. Pt would continue to benefit from OT intervention to address functional deficits. Follow up recommendation changed to SNF secondary to pt not having 24/7 Supervision/assistance at home.    Follow Up Recommendations  Supervision/Assistance - 24 hour;SNF    Equipment Recommendations  Other (comment)(defer to next venue of care)       Precautions / Restrictions Precautions Precautions: Fall Restrictions Weight Bearing Restrictions: No Other Position/Activity Restrictions: DVT R UE       Mobility Bed Mobility Overal bed mobility: Needs Assistance Bed Mobility: Supine to Sit;Sit to Supine     Supine to sit: Min assist;+2 for physical assistance Sit to supine: Min assist;+2 for physical assistance   General bed  mobility comments: increased time and struggle to rise to sitting from supine, ultimately requiring min assist for trunk control  Transfers Overall transfer level: Needs assistance Equipment used: Rolling walker (2 wheeled) Transfers: Sit to/from Stand Sit to Stand: From elevated surface;Min assist              Balance Overall balance assessment: Needs assistance Sitting-balance support: Feet supported Sitting balance-Leahy Scale: Good     Standing balance support: Bilateral upper extremity supported;During functional activity Standing balance-Leahy Scale: Fair Standing balance comment: UE support for balance       ADL either performed or assessed with clinical judgement   ADL       Grooming: Sitting;Set up;Oral care Grooming Details (indicate cue type and reason): utilized L UE for majority of grooming task          Vision Baseline Vision/History: Wears glasses Wears Glasses: Reading only Patient Visual Report: No change from baseline Vision Assessment?: No apparent visual deficits          Cognition Arousal/Alertness: Awake/alert Behavior During Therapy: WFL for tasks assessed/performed Overall Cognitive Status: Within Functional Limits for tasks assessed                      Pertinent Vitals/ Pain       Pain Assessment: No/denies pain         Frequency  Min 2X/week        Progress Toward Goals  OT Goals(current goals can  now be found in the care plan section)  Progress towards OT goals: Progressing toward goals  Acute Rehab OT Goals Patient Stated Goal: to go to reahb, get stronger OT Goal Formulation: With patient Time For Goal Achievement: 10/01/19 Potential to Achieve Goals: Good  Plan      Co-evaluation      Reason for Co-Treatment: For patient/therapist safety;To address functional/ADL transfers PT goals addressed during session: Mobility/safety with mobility;Balance;Proper use of DME        AM-PAC OT "6 Clicks" Daily  Activity     Outcome Measure   Help from another person eating meals?: A Little Help from another person taking care of personal grooming?: A Little Help from another person toileting, which includes using toliet, bedpan, or urinal?: A Lot Help from another person bathing (including washing, rinsing, drying)?: A Lot Help from another person to put on and taking off regular upper body clothing?: A Little Help from another person to put on and taking off regular lower body clothing?: A Lot 6 Click Score: 15    End of Session Equipment Utilized During Treatment: Gait belt;Rolling walker  OT Visit Diagnosis: Unsteadiness on feet (R26.81);Muscle weakness (generalized) (M62.81);Other symptoms and signs involving the nervous system (R29.898);Hemiplegia and hemiparesis Hemiplegia - Right/Left: Right Hemiplegia - dominant/non-dominant: Dominant   Activity Tolerance Patient tolerated treatment well   Patient Left in chair;with call bell/phone within reach;with chair alarm set   Nurse Communication Mobility status        Time: 8003-4917 OT Time Calculation (min): 23 min  Charges: OT General Charges $OT Visit: 1 Visit OT Treatments $Therapeutic Activity: 8-22 mins   Gypsy Decant MS, OTR/L 09/17/2019, 3:47 PM

## 2019-09-17 NOTE — Progress Notes (Signed)
  Speech Language Pathology Treatment: Dysphagia  Patient Details Name: [name] [name] MRN: [id number] DOB: [birth year] Today's Date: 09/17/2019 Time: 1045-1100 SLP Time Calculation (min) (ACUTE ONLY): 15 min  Assessment / Plan / Recommendation Clinical Impression RN contacted SLP regarding pt's Regular Barium Swallow this morning. SLP reviewed results with RN, which indicated "Restrictive focal esophageal web at the level of the thoracic inlet preventing the passage of a 13 mm barium tablet, likely symptomatic". No aspiration was seen on this study. Pt received education regarding safe swallow precautions, and continuation of current diet (dys 2, thin liquids, crushed meds). Safe swallow precautions posted at [facility]. Pt was given opportunity to ask questions. SLP will follow up once more for diet tolerance and education.   HPI HPI: 78yo female admitted 09/12/2019 with right facial weakness, swallowing difficulty, inability to keep food down. PMH: metastatic cancer (liver, lungs, bone), DVT, CVA, dyslipidemia, HTN, TIA x2 (2011). MRI = No acute intracranial abnormality or metastatic disease. Old thalamic and pontine small vessel infarcts. Generalized atrophy and moderate chronic small vessel disease.      SLP Plan  Continue with current plan of care       Recommendations  Diet recommendations: Dysphagia 2 (fine chop);Thin liquid Liquids provided via: Cup;Straw Medication Administration: Crushed with puree Supervision: Patient able to self feed Compensations: Small sips/bites;Slow rate;Follow solids with liquid Postural Changes and/or Swallow Maneuvers: Seated upright 90 degrees;Upright 30-60 min after meal                Oral Care Recommendations: Oral care BID Follow up Recommendations: None SLP Visit Diagnosis: Dysphagia, pharyngoesophageal phase (R13.14)(per regular barium swallow) Plan: Continue with current plan of care       [place], [facility],  [place] Speech Language Pathologist Office: [phone]  Shonna Chock 09/17/2019, 11:10 AM

## 2019-09-17 NOTE — Progress Notes (Signed)
Physical Therapy Treatment Patient Details Name: [name] [name] MRN: [id number] DOB: 09/19/1941 Today's Date: 09/17/2019    History of Present Illness [name] [name] is a 78 y.o. female with medical history significant of cancer in 2002 treated successfully.  Currently with a recurrence with widely metastatic disease involving liver lungs bone.  She is still undergoing treatment for her cancer and had a DVT in the R UE.  She presented with R facial droop and weakness over couple of weeks and now with some speech and swallowing issues.    PT Comments    Patient received in bed, R UE swollen and elevated on pillow. Agrees to PT/OT session. Patient requires min assist for supine >< sit. Max assist +2 for scooting up in bed. Patient is able to stand from elevated bed with min assist. Attempted to stand prior to bed being elevated and she was unable. Patient then ambulated with RW and min guard 16 feet. Fatigued with this distance. Patient will continue to benefit from skilled PT while here to improve LE strength and functional independence.      Follow Up Recommendations  SNF;Supervision for mobility/OOB     Equipment Recommendations  None recommended by PT    Recommendations for Other Services       Precautions / Restrictions Precautions Precautions: Fall Restrictions Weight Bearing Restrictions: No Other Position/Activity Restrictions: DVT R UE    Mobility  Bed Mobility Overal bed mobility: Needs Assistance Bed Mobility: Supine to Sit;Sit to Supine     Supine to sit: Min assist;+2 for physical assistance Sit to supine: Min assist;+2 for physical assistance   General bed mobility comments: increased time and struggle to rise to sitting from supine, ultimately requiring min assist for trunk control  Transfers Overall transfer level: Needs assistance Equipment used: Rolling walker (2 wheeled) Transfers: Sit to/from Stand Sit to Stand: From elevated surface;Min assist            Ambulation/Gait Ambulation/Gait assistance: Min guard Gait Distance (Feet): 16 Feet Assistive device: Rolling walker (2 wheeled) Gait Pattern/deviations: Step-to pattern;Trunk flexed;Decreased stride length Gait velocity: decreased   General Gait Details: walked to door then turned around due to fatigue and weakness.  Cues needed for safety with RW ( to get closer)   Stairs             Wheelchair Mobility    Modified Rankin (Stroke Patients Only)       Balance Overall balance assessment: Needs assistance Sitting-balance support: Feet supported Sitting balance-Leahy Scale: Good     Standing balance support: Bilateral upper extremity supported;During functional activity Standing balance-Leahy Scale: Fair Standing balance comment: UE support for balance                            Cognition Arousal/Alertness: Awake/alert Behavior During Therapy: WFL for tasks assessed/performed Overall Cognitive Status: Within Functional Limits for tasks assessed                                        Exercises      General Comments        Pertinent Vitals/Pain Pain Assessment: No/denies pain    Home Living                      Prior Function  PT Goals (current goals can now be found in the care plan section) Acute Rehab PT Goals Patient Stated Goal: to go to reahb, get stronger PT Goal Formulation: With patient Time For Goal Achievement: 09/27/19 Potential to Achieve Goals: Good Progress towards PT goals: Progressing toward goals    Frequency    Min 4X/week      PT Plan Current plan remains appropriate    Co-evaluation PT/OT/SLP Co-Evaluation/Treatment: Yes Reason for Co-Treatment: For patient/therapist safety;To address functional/ADL transfers PT goals addressed during session: Mobility/safety with mobility;Balance;Proper use of DME        AM-PAC PT "6 Clicks" Mobility   Outcome Measure   Help needed turning from your back to your side while in a flat bed without using bedrails?: A Little Help needed moving from lying on your back to sitting on the side of a flat bed without using bedrails?: A Little Help needed moving to and from a bed to a chair (including a wheelchair)?: A Little Help needed standing up from a chair using your arms (e.g., wheelchair or bedside chair)?: A Lot Help needed to walk in hospital room?: A Little Help needed climbing 3-5 steps with a railing? : Total 6 Click Score: 15    End of Session Equipment Utilized During Treatment: Gait belt Activity Tolerance: Patient limited by fatigue Patient left: in bed;with bed alarm set;with call bell/phone within reach Nurse Communication: Mobility status PT Visit Diagnosis: Unsteadiness on feet (R26.81);Muscle weakness (generalized) (M62.81);Difficulty in walking, not elsewhere classified (R26.2)     Time: 8333-8329 PT Time Calculation (min) (ACUTE ONLY): 23 min  Charges:  $Gait Training: 8-22 mins                     Vena Bassinger, PT, GCS 09/17/19,2:06 PM

## 2019-09-17 NOTE — Progress Notes (Signed)
Became very upset with this nurse and tech because she wanted to walk to the bathroom and this nurse told her it was unsafe, as she walked to bathroom early yesterday morning with heavy assist of 2 staff, w/c, and gait belt and was very unsteady and leaning on this nurse.  Had to be reminded several times to walk and lean to the left, as was leaning heavily onto the right side and was becoming weak and all of her weight was on [place].  I suggested that she use a bedside commode so we could assist her to stand and pivot for her safety.  She refused that and also refused to use the [place] to go to the bathroom.  This nurse had offered her to [place] as well because she did not want to use a bedpan because she said it was inhuman.  She finally agreed upon the purwick and this nurse placed it and explained how it worked.  She said she is not stupid and not to insult her intelligence by telling her how it works.  She actually put in her peri area herself and wanted a washcloth to place down there as well because she said it will capture any urine that the purwick does not catch.  This nurse offered to place the mesh panties on her to help keep the purwick in place but she said she did not need or want that and refused to allow the washcloth to be moved.  When the tech came in and suggested the mesh panties she said she had never been offered that and she also said she was wet and was just left in urine.  This nurse reminded her that she was just in the room and she was dry and we again checked her and she was dry.  She said we were calling her a liar.  This nurse did explain that they were not calling her a liar but we were just showing her that she was not left in a wet bed.  She said she should have just gone to [facility] because these nurses are liars and know nothing.  This nurse reminded her that she was not lied to and that she was kept informed about everything that is going on with her throughout the shift.   She also had a moment where she said her dad was looking for her and her grandson came and borrowed her car.  This nurse reminded her that she was in the hospital and that her grandson had not been here borrowing her car because she is in the hospital.  She again said we were calling her a liar.  I told her we were not calling her a liar but reminding her she is in the hospital and was just awakened and she was slightly confused.  She took offense to being called confused because she said we must think she is crazy and she has good sense.  This nurse just let her vent then apologized for her feeling the way she did and excused herself from her room.

## 2019-09-17 NOTE — Care Management Important Message (Signed)
Important Message  Patient Details  Name: [name] [name] MRN: [id number] Date of Birth: 02/03/1941   Medicare Important Message Given:  Yes     Orbie Pyo 09/17/2019, 3:02 PM

## 2019-09-17 NOTE — Progress Notes (Addendum)
Spoke to son over the phone about SNF vs home with [facility]. He is trying to get his daughter and son in law arranged to care for the patient but wants to talk with his mother before making the final decision. Son to talk with patient and daughter and call CM back.   1620: spoke with son and they are choosing [place] for SNF. CM asked [facility] to start insurance auth. MD asked for new covid test.

## 2019-09-17 NOTE — Progress Notes (Signed)
[name] [name] Kitchen  PROGRESS NOTE    [name] [name]  HBZ:[id number] DOB: November 08, 1940 DOA: 09/12/2019 PCP: Cassandria Anger, MD   Brief Narrative:   78 year old female with history of recurrent widely metastatic breast cancer on chemotherapy, with metastasis to lungs, bones and liver,, history of palliative radiation to the neck and subclavian area, chronic right arm weakness and stiffness, hypertension, dyslipidemia, osteoarthritis, history of DVT on Eliquis presented to the emergency room yesterday with sudden onset right face numbness, and some left leg numbness,patient has numerous complaints also reports ongoing dyspnea on exertion, chronic back pain, some difficulty swallowing as well.  11/16: To get barium swallow today. Labs improved. She's more interactive today.    Assessment & Plan:   Active Problems:   Essential hypertension   Situational anxiety   Breast cancer metastasized to bone, right (HCC)   Chest pain   Facial weakness   Protein-calorie malnutrition, severe  Transient right facial weakness, left leg weakness Debility - facial weaknes and LLE weakness have resolved - MRI negative for acute ischemic events or metastasis - PT/OT rec SNFvs home w/ 24h supervision, family is trying to come up with24h coverage, FL2 sent - SLP evaluation: Dys 2 diet w/ thin liquids - TTE: EF 60 to 65% - Resumed apixaban, she was originally on this for right arm DVT     - No changes ON; continue as above.   Bilateral pleural effusion, dyspnea on exertion - Could be malignant effusions, has widely metastatic pulmonary disease - echo EF 60-65%, trivial pericardial effusion;comfortableon RA - stable small-to-moderated R pleural effusion; left pleural effusion is small; no PNA seen on CT, but stable b/l nodules are noted  Recurrent widely metastatic breast cancer - Prognosis is poor, anticipate she will need a goals of care discussion in the near  future, followed by Dr. Lindi Adie, at this time insists on full code and full scope of treatment - Receiving palliative chemotherapy with Keytruda infusions every 3 weeks, last treatment was 11/3  Stage 3-4 chronic kidney disease - Baseline creatinine around 2-2.4 - Scr is down to 2.69 w/ fluids. Hold. Monitor.  Chronic right arm DVT  - chronic right arm weakness and stiffness, - continue Eliquis   Hypertension - Stable, ARB on hold - BP ok  Dysphagia/Globus sensation - no complaints of globus this AM - currently on Dys 2 diet, thin liquids, meds crushed in puree     - MBS today?  Denies complaints this AM. Hold fluids today. Monitor. Working on placement.   DVT prophylaxis: Eliquis Code Status: FULL   Disposition Plan: TBD  Consultants:   Neurology  ROS:  Denies CP, palpitations, ab pain, N, V . Remainder 10-pt ROS is negative for all not previously mentioned.  Subjective: "I'm here."  Objective: Vitals:   09/16/19 1615 09/16/19 1935 09/17/19 0000 09/17/19 0324  BP: (!) 111/56 (!) 112/58 110/60 116/68  Pulse: 91 88 92 88  Resp: 16 18 20 18   Temp: 97.9 F (36.6 C) 98.5 F (36.9 C) 98.6 F (37 C) 98.1 F (36.7 C)  TempSrc: Oral Oral Oral Oral  SpO2: 95% 96% 99% 98%  Weight:        Intake/Output Summary (Last 24 hours) at 09/17/2019 0729 Last data filed at 09/17/2019 0300 Gross per 24 hour  Intake -  Output 150 ml  Net -150 ml   Filed Weights   09/12/19 1432  Weight: 69.5 kg    Examination:  General: 78 y.o. female resting in bed in NAD  Cardiovascular: RRR, +S1, S2, 1/6 SEM Respiratory: clear anteriorly, WOB normal GI: BS+, NDNT MSK: No e/c/c Neuro: alert to name, follows commands Psyc: calm/cooperative   Data Reviewed: I have personally reviewed following labs and imaging studies.  CBC: Recent Labs  Lab 09/12/19 1358 09/12/19 1404 09/13/19 1146 09/15/19 1012 09/16/19 0700  WBC 9.2  --  9.3 8.8  7.7  NEUTROABS 5.8  --   --  6.0 5.9  HGB 11.5* 12.9 10.8* 10.2* 9.4*  HCT 36.6 38.0 34.1* 32.1* 29.4*  MCV 91.3  --  90.2 89.7 90.5  PLT 301  --  277 280 678   Basic Metabolic Panel: Recent Labs  Lab 09/12/19 1358 09/12/19 1404 09/13/19 1146 09/14/19 1300 09/15/19 1012 09/16/19 0700  NA 139 139 137  --  134* 134*  K 4.0 4.0 4.2  --  4.3 4.6  CL 106 107 103  --  103 103  CO2 16*  --  20*  --  22 21*  GLUCOSE 113* 106* 110*  --  118* 89  BUN 22 27* 24*  --  28* 34*  CREATININE 2.36* 2.10* 2.32*  --  2.95* 3.23*  CALCIUM 9.2  --  9.2  --  9.0 9.1  MG  --   --   --  2.6*  --  2.9*  PHOS  --   --   --   --  3.7 4.0   GFR: Estimated Creatinine Clearance: 13.8 mL/min (A) (by C-G formula based on SCr of 3.23 mg/dL (H)). Liver Function Tests: Recent Labs  Lab 09/12/19 1358 09/15/19 1012 09/16/19 0700  AST 76*  --   --   ALT 17  --   --   ALKPHOS 141*  --   --   BILITOT 1.0  --   --   PROT 7.9  --   --   ALBUMIN 2.9* 2.6* 2.3*   No results for input(s): LIPASE, AMYLASE in the last 168 hours. No results for input(s): AMMONIA in the last 168 hours. Coagulation Profile: Recent Labs  Lab 09/12/19 1358  INR 1.2   Cardiac Enzymes: No results for input(s): CKTOTAL, CKMB, CKMBINDEX, TROPONINI in the last 168 hours. BNP (last 3 results) No results for input(s): PROBNP in the last 8760 hours. HbA1C: No results for input(s): HGBA1C in the last 72 hours. CBG: No results for input(s): GLUCAP in the last 168 hours. Lipid Profile: No results for input(s): CHOL, HDL, LDLCALC, TRIG, CHOLHDL, LDLDIRECT in the last 72 hours. Thyroid Function Tests: No results for input(s): TSH, T4TOTAL, FREET4, T3FREE, THYROIDAB in the last 72 hours. Anemia Panel: No results for input(s): VITAMINB12, FOLATE, FERRITIN, TIBC, IRON, RETICCTPCT in the last 72 hours. Sepsis Labs: No results for input(s): PROCALCITON, LATICACIDVEN in the last 168 hours.  Recent Results (from the past 240 hour(s))   SARS CORONAVIRUS 2 (TAT 6-24 HRS) Nasopharyngeal Nasopharyngeal Swab     Status: None   Collection Time: 09/12/19  6:52 PM   Specimen: Nasopharyngeal Swab  Result Value Ref Range Status   SARS Coronavirus 2 NEGATIVE NEGATIVE Final    Comment: (NOTE) SARS-CoV-2 target nucleic acids are NOT DETECTED. The SARS-CoV-2 RNA is generally detectable in upper and lower respiratory specimens during the acute phase of infection. Negative results do not preclude SARS-CoV-2 infection, do not rule out co-infections with other pathogens, and should not be used as the sole basis for treatment or other patient management decisions. Negative results must be combined with clinical observations, patient history,  and epidemiological information. The expected result is Negative. Fact Sheet for Patients: SugarRoll.be Fact Sheet for Healthcare Providers: https://www.woods-mathews.com/ This test is not yet approved or cleared by the [place] FDA and  has been authorized for detection and/or diagnosis of SARS-CoV-2 by FDA under an Emergency Use Authorization (EUA). This EUA will remain  in effect (meaning this test can be used) for the duration of the COVID-19 declaration under Section 56 4(b)(1) of the Act, 21 U.S.C. section 360bbb-3(b)(1), unless the authorization is terminated or revoked sooner. Performed at [facility], [place] [address] [place], [place] [address]       Radiology Studies: No results found.   Scheduled Meds: . amLODipine  10 mg Oral Daily  . apixaban  5 mg Oral BID  . Chlorhexidine Gluconate Cloth  6 each Topical Daily  . docusate sodium  100 mg Oral BID  . dronabinol  2.5 mg Oral BID AC  . feeding supplement (ENSURE ENLIVE)  237 mL Oral TID BM  . gabapentin  100 mg Oral QHS  . LORazepam  0.25 mg Intravenous Once  . mirtazapine  7.5 mg Oral QHS  . multivitamin with minerals  1 tablet Oral Daily  . pioglitazone  15 mg Oral  Daily  . sodium chloride flush  3 mL Intravenous Once  . traMADol  50 mg Oral BID   Continuous Infusions:   LOS: 4 days    Time spent: 25 minutes spent in the coordination of care today.    Jonnie Finner, DO [facility] Pager [phone]  If 7PM-7AM, please contact night-coverage [email or web address] Password [facility] 09/17/2019, 7:29 AM

## 2019-09-18 LAB — CBC WITH DIFFERENTIAL/PLATELET
Abs Immature Granulocytes: 0.1 10*3/uL — ABNORMAL HIGH (ref 0.00–0.07)
Basophils Absolute: 0 10*3/uL (ref 0.0–0.1)
Basophils Relative: 0 %
Eosinophils Absolute: 0 10*3/uL (ref 0.0–0.5)
Eosinophils Relative: 0 %
HCT: 29.4 % — ABNORMAL LOW (ref 36.0–46.0)
Hemoglobin: 9.3 g/dL — ABNORMAL LOW (ref 12.0–15.0)
Immature Granulocytes: 1 %
Lymphocytes Relative: 9 %
Lymphs Abs: 0.9 10*3/uL (ref 0.7–4.0)
MCH: 28.2 pg (ref 26.0–34.0)
MCHC: 31.6 g/dL (ref 30.0–36.0)
MCV: 89.1 fL (ref 80.0–100.0)
Monocytes Absolute: 1.5 10*3/uL — ABNORMAL HIGH (ref 0.1–1.0)
Monocytes Relative: 15 %
Neutro Abs: 7.1 10*3/uL (ref 1.7–7.7)
Neutrophils Relative %: 75 %
Platelets: 265 10*3/uL (ref 150–400)
RBC: 3.3 MIL/uL — ABNORMAL LOW (ref 3.87–5.11)
RDW: 16.9 % — ABNORMAL HIGH (ref 11.5–15.5)
WBC: 9.7 10*3/uL (ref 4.0–10.5)
nRBC: 0 % (ref 0.0–0.2)

## 2019-09-18 LAB — MAGNESIUM: Magnesium: 2.8 mg/dL — ABNORMAL HIGH (ref 1.7–2.4)

## 2019-09-18 LAB — RENAL FUNCTION PANEL
Albumin: 2.1 g/dL — ABNORMAL LOW (ref 3.5–5.0)
Anion gap: 11 (ref 5–15)
BUN: 34 mg/dL — ABNORMAL HIGH (ref 8–23)
CO2: 21 mmol/L — ABNORMAL LOW (ref 22–32)
Calcium: 9.8 mg/dL (ref 8.9–10.3)
Chloride: 105 mmol/L (ref 98–111)
Creatinine, Ser: 2.61 mg/dL — ABNORMAL HIGH (ref 0.44–1.00)
GFR calc Af Amer: 20 mL/min — ABNORMAL LOW (ref >60)
GFR calc non Af Amer: 17 mL/min — ABNORMAL LOW (ref >60)
Glucose, Bld: 88 mg/dL (ref 70–99)
Phosphorus: 3.6 mg/dL (ref 2.5–4.6)
Potassium: 4.8 mmol/L (ref 3.5–5.1)
Sodium: 137 mmol/L (ref 135–145)

## 2019-09-18 MED ORDER — ONDANSETRON HCL 4 MG/2ML IJ SOLN
4.0000 mg | Freq: Four times a day (QID) | INTRAMUSCULAR | Status: DC | PRN
  Administered 2019-09-18 – 2019-09-22 (×3): 4 mg via INTRAVENOUS
  Filled 2019-09-18 (×3): qty 2

## 2019-09-18 MED ORDER — POLYETHYLENE GLYCOL 3350 17 G PO PACK
34.0000 g | PACK | Freq: Every day | ORAL | Status: DC
  Administered 2019-09-18 – 2019-09-22 (×4): 34 g via ORAL
  Filled 2019-09-18 (×4): qty 2

## 2019-09-18 MED ORDER — BISACODYL 5 MG PO TBEC
20.0000 mg | DELAYED_RELEASE_TABLET | Freq: Every day | ORAL | Status: DC
  Administered 2019-09-18 – 2019-09-22 (×4): 20 mg via ORAL
  Filled 2019-09-18 (×5): qty 4

## 2019-09-18 NOTE — Plan of Care (Signed)
  Problem: Nutrition: Goal: Risk of aspiration will decrease Outcome: Not Progressing

## 2019-09-18 NOTE — Progress Notes (Addendum)
[name] [name]  PROGRESS NOTE    [name] [name]  DZH:[id number] DOB: [birth year] DOA: 09/12/2019 PCP: Cassandria Anger, MD   Brief Narrative:   78 year old female with history of recurrent widely metastatic breast cancer on chemotherapy, with metastasis to lungs, bones and liver,, history of palliative radiation to the neck and subclavian area, chronic right arm weakness and stiffness, hypertension, dyslipidemia, osteoarthritis, history of DVT on Eliquis presented to the emergency room yesterday with sudden onset right face numbness, and some left leg numbness,patient has numerous complaints also reports ongoing dyspnea on exertion, chronic back pain, some difficulty swallowing as well.  11/16: To get barium swallow today. Labs improved. She's more interactive today.  11/17: Having some problems with her pills this AM. Will speak with GI Re: barium swallow results  Spoke with GI. Will need EGD w/ dilation. Needs eliquis stopped. Doppler from 04/24/2019 shows chronic DVT (this is why she's on eliquis). Hold eliquis for now.    Assessment & Plan:   Active Problems:   Essential hypertension   Situational anxiety   Breast cancer metastasized to bone, right (HCC)   Chest pain   Facial weakness   Protein-calorie malnutrition, severe  Transient right facial weakness, left leg weakness Debility - facial weaknes and LLE weakness have resolved - MRI negative for acute ischemic events or metastasis - SLP evaluation: Dys 2 diet w/ thin liquids - TTE: EF 60 to 65% - Resumed apixaban, she was originally on this for right arm DVT     - PT/OT rec SNF; family agreeable, working on options  Bilateral pleural effusion, dyspnea on exertion - Could be malignant effusions, has widely metastatic pulmonary disease - echo EF 60-65%, trivial pericardial effusion;comfortableon RA - stable small-to-moderated R pleural effusion; left pleural effusion is small; no PNA seen on CT,  but stable b/l nodules are noted  Recurrent widely metastatic breast cancer - Prognosis is poor, anticipate she will need a goals of care discussion in the near future, followed by Dr. Lindi Adie, at this time insists on full code and full scope of treatment - Receiving palliative chemotherapy with Keytruda infusions every 3 weeks, last treatment was 11/3  Stage 3-4 chronic kidney disease - Baseline creatinine around 2-2.4 - Scr is down to 2.61; encourage PO intake, monitor  Chronic right arm DVT  - chronic right arm weakness and stiffness, - continue Eliquis   Hypertension - Stable, ARB on hold - continue holding ARB  Dysphagia/Globus sensation -no complaints of globus this AM - currently on Dys 2 diet, thin liquids, meds crushed in puree     - RBS: "Restrictive focal esophageal web at the level of the thoracic inlet preventing the passage of a 13 mm barium tablet, likely symptomatic". No aspiration was seen on this study. Pt received education regarding safe swallow precautions, and continuation of current diet (dys 2, thin liquids, crushed meds).      - will speak with GI about results and options  DVT prophylaxis: eliquis Code Status: FULL   Disposition Plan: TBD  Consultants:  Neurology  ROS:  C/o nausea, vomiting. Denies CP, dyspnea. Remainder 10-pt ROS is negative for all not previously mentioned.  Subjective: "It's still coming up."  Objective: Vitals:   09/17/19 1548 09/17/19 1919 09/17/19 2313 09/18/19 0327  BP: (!) 114/59 (!) 107/54 (!) 109/53 (!) 107/56  Pulse: 94 95 93 90  Resp: 18 18 18 18   Temp: 98 F (36.7 C) 98.7 F (37.1 C)  98.3 F (36.8 C)  TempSrc:  Oral Oral Oral Oral  SpO2: 97% 96% 92% 91%  Weight:        Intake/Output Summary (Last 24 hours) at 09/18/2019 2633 Last data filed at 09/17/2019 1919 Gross per 24 hour  Intake 0 ml  Output 625 ml  Net -625 ml   Filed Weights   09/12/19 1432  Weight:  69.5 kg    Examination:  General: 78 y.o. female resting in bed in NAD Cardiovascular: RRR, +S1, S2, no m/g/r Respiratory: CTABL, no w/r/r GI: BS+, NDNT, soft MSK: No e/c/c Neuro: alert to name, follows commands Psyc: calm/cooperative   Data Reviewed: I have personally reviewed following labs and imaging studies.  CBC: Recent Labs  Lab 09/12/19 1358 09/12/19 1404 09/13/19 1146 09/15/19 1012 09/16/19 0700 09/18/19 0409  WBC 9.2  --  9.3 8.8 7.7 9.7  NEUTROABS 5.8  --   --  6.0 5.9 7.1  HGB 11.5* 12.9 10.8* 10.2* 9.4* 9.3*  HCT 36.6 38.0 34.1* 32.1* 29.4* 29.4*  MCV 91.3  --  90.2 89.7 90.5 89.1  PLT 301  --  277 280 264 354   Basic Metabolic Panel: Recent Labs  Lab 09/13/19 1146 09/14/19 1300 09/15/19 1012 09/16/19 0700 09/17/19 0749 09/18/19 0409 09/18/19 0411  NA 137  --  134* 134* 136  --  137  K 4.2  --  4.3 4.6 4.4  --  4.8  CL 103  --  103 103 106  --  105  CO2 20*  --  22 21* 21*  --  21*  GLUCOSE 110*  --  118* 89 85  --  88  BUN 24*  --  28* 34* 33*  --  34*  CREATININE 2.32*  --  2.95* 3.23* 2.69*  --  2.61*  CALCIUM 9.2  --  9.0 9.1 9.2  --  9.8  MG  --  2.6*  --  2.9*  --  2.8*  --   PHOS  --   --  3.7 4.0 3.5  --  3.6   GFR: Estimated Creatinine Clearance: 17.1 mL/min (A) (by C-G formula based on SCr of 2.61 mg/dL (H)). Liver Function Tests: Recent Labs  Lab 09/12/19 1358 09/15/19 1012 09/16/19 0700 09/17/19 0749 09/18/19 0411  AST 76*  --   --   --   --   ALT 17  --   --   --   --   ALKPHOS 141*  --   --   --   --   BILITOT 1.0  --   --   --   --   PROT 7.9  --   --   --   --   ALBUMIN 2.9* 2.6* 2.3* 2.2* 2.1*   No results for input(s): LIPASE, AMYLASE in the last 168 hours. No results for input(s): AMMONIA in the last 168 hours. Coagulation Profile: Recent Labs  Lab 09/12/19 1358  INR 1.2   Cardiac Enzymes: No results for input(s): CKTOTAL, CKMB, CKMBINDEX, TROPONINI in the last 168 hours. BNP (last 3 results) No results  for input(s): PROBNP in the last 8760 hours. HbA1C: No results for input(s): HGBA1C in the last 72 hours. CBG: No results for input(s): GLUCAP in the last 168 hours. Lipid Profile: No results for input(s): CHOL, HDL, LDLCALC, TRIG, CHOLHDL, LDLDIRECT in the last 72 hours. Thyroid Function Tests: No results for input(s): TSH, T4TOTAL, FREET4, T3FREE, THYROIDAB in the last 72 hours. Anemia Panel: No results for input(s): VITAMINB12, FOLATE, FERRITIN, TIBC, IRON,  RETICCTPCT in the last 72 hours. Sepsis Labs: No results for input(s): PROCALCITON, LATICACIDVEN in the last 168 hours.  Recent Results (from the past 240 hour(s))  SARS CORONAVIRUS 2 (TAT 6-24 HRS) Nasopharyngeal Nasopharyngeal Swab     Status: None   Collection Time: 09/12/19  6:52 PM   Specimen: Nasopharyngeal Swab  Result Value Ref Range Status   SARS Coronavirus 2 NEGATIVE NEGATIVE Final    Comment: (NOTE) SARS-CoV-2 target nucleic acids are NOT DETECTED. The SARS-CoV-2 RNA is generally detectable in upper and lower respiratory specimens during the acute phase of infection. Negative results do not preclude SARS-CoV-2 infection, do not rule out co-infections with other pathogens, and should not be used as the sole basis for treatment or other patient management decisions. Negative results must be combined with clinical observations, patient history, and epidemiological information. The expected result is Negative. Fact Sheet for Patients: SugarRoll.be Fact Sheet for Healthcare Providers: https://www.woods-mathews.com/ This test is not yet approved or cleared by the [place] FDA and  has been authorized for detection and/or diagnosis of SARS-CoV-2 by FDA under an Emergency Use Authorization (EUA). This EUA will remain  in effect (meaning this test can be used) for the duration of the COVID-19 declaration under Section 56 4(b)(1) of the Act, 21 U.S.C. section 360bbb-3(b)(1),  unless the authorization is terminated or revoked sooner. Performed at [facility], [place] [address], [place], [place] [address]   SARS CORONAVIRUS 2 (TAT 6-24 HRS) Nasopharyngeal Nasopharyngeal Swab     Status: None   Collection Time: 09/17/19  4:37 PM   Specimen: Nasopharyngeal Swab  Result Value Ref Range Status   SARS Coronavirus 2 NEGATIVE NEGATIVE Final    Comment: (NOTE) SARS-CoV-2 target nucleic acids are NOT DETECTED. The SARS-CoV-2 RNA is generally detectable in upper and lower respiratory specimens during the acute phase of infection. Negative results do not preclude SARS-CoV-2 infection, do not rule out co-infections with other pathogens, and should not be used as the sole basis for treatment or other patient management decisions. Negative results must be combined with clinical observations, patient history, and epidemiological information. The expected result is Negative. Fact Sheet for Patients: SugarRoll.be Fact Sheet for Healthcare Providers: https://www.woods-mathews.com/ This test is not yet approved or cleared by the [place] FDA and  has been authorized for detection and/or diagnosis of SARS-CoV-2 by FDA under an Emergency Use Authorization (EUA). This EUA will remain  in effect (meaning this test can be used) for the duration of the COVID-19 declaration under Section 56 4(b)(1) of the Act, 21 U.S.C. section 360bbb-3(b)(1), unless the authorization is terminated or revoked sooner. Performed at [facility], [place] [address], [place], [place] [address]       Radiology Studies: Dg Esophagus W Single Cm (sol Or Thin Ba)  Result Date: 09/17/2019 CLINICAL DATA:  Solid food dysphagia with regurgitation. EXAM: ESOPHOGRAM/BARIUM SWALLOW TECHNIQUE: Single contrast examination was performed using  thin barium. FLUOROSCOPY TIME:  Fluoroscopy Time: 1 minutes and 42 seconds of low-dose pulsed fluoroscopy. Radiation  Exposure Index (if provided by the fluoroscopic device): Number of Acquired Spot Images: 19.6 mGy COMPARISON:  Chest CT 09/12/2019. Neck CT 01/03/2019. FINDINGS: Study is mildly limited by the patient's limited mobility. Examination was performed supine, semi erect and in the right lateral decubitus positions. The patient swallowed the barium without difficulty. The esophageal motility is within normal limits. No aspiration was identified. There is an esophageal web at the level of the thoracic inlet which prevented the  passage of a 13 mm barium tablet. No mucosal ulceration identified in this region. No other esophageal strictures are identified. There is a small distal esophageal epiphrenic diverticulum. Port-A-Cath and scattered sclerotic osseous lesions are noted in this patient with a history of metastatic breast cancer. IMPRESSION: 1. Restrictive focal esophageal web at the level of the thoracic inlet preventing the passage of a 13 mm barium tablet, likely symptomatic. 2. Small distal esophageal epiphrenic diverticulum. 3. No mucosal ulceration or other significant findings. Electronically Signed   By: Richardean Sale M.D.   On: 09/17/2019 09:55     Scheduled Meds: . amLODipine  10 mg Oral Daily  . apixaban  5 mg Oral BID  . Chlorhexidine Gluconate Cloth  6 each Topical Daily  . docusate  100 mg Oral BID  . dronabinol  2.5 mg Oral BID AC  . feeding supplement (ENSURE ENLIVE)  237 mL Oral TID BM  . gabapentin  100 mg Oral QHS  . LORazepam  0.25 mg Intravenous Once  . mirtazapine  7.5 mg Oral QHS  . multivitamin with minerals  1 tablet Oral Daily  . pioglitazone  15 mg Oral Daily  . sodium chloride flush  3 mL Intravenous Once  . traMADol  50 mg Oral BID   Continuous Infusions:   LOS: 5 days    Time spent: 25 minutes spent in the coordination of care today.    Jonnie Finner, DO [facility] Pager [phone]  If 7PM-7AM, please contact night-coverage [email or web address] Password  [facility] 09/18/2019, 7:28 AM

## 2019-09-18 NOTE — Consult Note (Addendum)
[facility] Consult: 1:51 PM 09/18/2019  LOS: 5 days    Referring Provider: Dr Cherylann Ratel   Primary Care Physician:  Alain Marion [name], MD Primary Gastroenterologist:  Dr. Delfin Edis, remotely.   Reason for Consultation:  Dysphagia   HPI: [name] [name] is a 78 y.o. female.  History widely metastatic breast cancer on chemo.  Mets to the lungs, bones, liver.  Previous palliative radiation to the neck and subclavian region.  Hypertension.  Dyslipidemia.  Osteoarthritis.  DVT, on Eliquis. 05/2003 routine, screening colonoscopy.  Normal study.  No polyps.  Commend rescreen in 10 years.   Admitted with right facial and left leg numbness/weakness, DOE, back pain, dysphagia. No stroke but small vessel disease and atrophy on CT brain.  MRI imaging of brain negative for acute CVA.  For 3 months patient has had solid food dysphagia.  When she swallows, she feels like the food is getting caught in the region of the upper esophagus, not at the GE junction.  She regurgitates at times.  Because of her swallowing troubles, she is not eating as much.  There is been no bloody emesis, no CGE.  Also has chronic constipation.  Documented weight loss from 196 # on 04/23/2019 in July 2020 to 153 # currently Restaging CTAP with out contrast 08/10/2019 showing new, enlarging hepatic mets compared with PET scan 04/2019.  Enlargement of bilateral pulmonary masses and nodules.  Stable multifocal bony mets.  Stable left adrenal mets.  Scattered colonic diverticulosis . Abnormal colonic wall thickening in the distal colon, ? diverticular disease though metastatic breast cancer or primary colonic neoplasm could have similar appearance.  09/14/19 Bedside swallow study with reports of globus sensation to larger pills, no S/S aspiration.   However she did vomit watery material within a few minutes of the swallowing evaluation. 09/17/2019 barium esophagram showed "restrictive focal esophageal web at level of thoracic inlet preventing passage of 13 mm tablet, likely symptomatic... Small, distal esophageal epiphrenic diverticulum.  No aspiration. Speech-language pathology performed Patient continues on chronic Eliquis.  Dietitian has labeled her as severe malnutrition of chronic illness and added Ensure 3 times daily and multivitamins.  Past Medical History:  Diagnosis Date  . Anxiety    hx of  . Asthma   . Breast cancer ([place])    2002; metastatic in 2012, right breast, spread to left hip in 2012  . Cerebrovascular accident [facility])    R thalamic CVA 01/2010  . Chemotherapy induced neutropenia ([place]) 08/15/2017  . Dyslipidemia   . Encounter for therapeutic drug monitoring 04/14/2017  . Goals of care, counseling/discussion 04/19/2017  . Hyperlipemia   . Hypertension   . Low back pain   . Osteoarthritis   . TIA (transient ischemic attack)    x2 in 2011  . Vertigo 2014    Past Surgical History:  Procedure Laterality Date  . ABDOMINAL HYSTERECTOMY     complete  . BREAST LUMPECTOMY     Right breast 2002  . JOINT REPLACEMENT  2012   L hip due to breast ca met  .  TOTAL HIP ARTHROPLASTY Right 10/28/2015   Procedure: RIGHT TOTAL HIP ARTHROPLASTY ANTERIOR APPROACH;  Surgeon: Paralee Cancel, MD;  Location: [facility];  Service: Orthopedics;  Laterality: Right;    Prior to Admission medications   Medication Sig Start Date End Date Taking? Authorizing Provider  ALPRAZolam Duanne Moron) 0.5 MG tablet Take 1 tablet (0.5 mg total) by mouth 2 (two) times daily as needed for anxiety or sleep. 03/13/19  Yes Nicholas Lose, MD  amLODipine (NORVASC) 10 MG tablet Take 1 tablet (10 mg total) by mouth daily. 03/13/19  Yes Nicholas Lose, MD  apixaban (ELIQUIS) 5 MG TABS tablet  Take 1 tablet (5 mg total) by mouth 2 (two) times daily. Take 10 mg bid for 7  days orally and then decrease to 5 mg bid orally thereafter. 01/11/19  Yes [name], [name], MD  Cholecalciferol (VITAMIN D3) 1000 UNITS CAPS Take 1 capsule by mouth daily.    Yes [provider]  clotrimazole-betamethasone (LOTRISONE) cream Apply 1 application topically 2 (two) times daily. 06/07/19 06/06/20 Yes [name], [name], MD  dronabinol (MARINOL) 2.5 MG capsule Take 1 capsule (2.5 mg total) by mouth 2 (two) times daily before a meal. 08/27/19  Yes Nicholas Lose, MD  gabapentin (NEURONTIN) 100 MG capsule Take 1 capsule (100 mg total) by mouth at bedtime. 01/11/19  Yes [name], [name], MD  hydrochlorothiazide (HYDRODIURIL) 25 MG tablet Take 25 mg by mouth daily. 08/26/18  Yes [provider]  lidocaine-prilocaine (EMLA) cream Apply to affected area once 08/27/19  Yes Nicholas Lose, MD  losartan (COZAAR) 100 MG tablet Take 100 mg by mouth daily. 08/26/18  Yes [provider]  meclizine (ANTIVERT) 12.5 MG tablet Take 1 tablet (12.5 mg total) by mouth 2 (two) times daily as needed for dizziness. 06/29/14  Yes Sciacca, Marissa, PA-C  mirtazapine (REMERON) 7.5 MG tablet Take 7.5 mg by mouth daily. 06/29/19  Yes [provider]  nystatin (MYCOSTATIN/NYSTOP) powder Apply 1 Dose topically daily. 04/06/18  Yes [provider]  ondansetron (ZOFRAN) 8 MG tablet Take 1 tablet (8 mg total) by mouth every 8 (eight) hours as needed for nausea or vomiting. 07/18/19  Yes Nicholas Lose, MD  pioglitazone (ACTOS) 15 MG tablet Take 1 tablet (15 mg total) by mouth daily. 04/26/19  Yes Nicholas Lose, MD  prochlorperazine (COMPAZINE) 10 MG tablet Take 1 tablet (10 mg total) by mouth every 6 (six) hours as needed (Nausea or vomiting). 08/27/19  Yes Nicholas Lose, MD  traMADol (ULTRAM) 50 MG tablet Take 50 mg by mouth 2 (two) times daily. 06/29/19  Yes [provider]  Zoledronic Acid (ZOMETA) 4 MG/100ML IVPB Inject 4 mg into the vein every 3 (three) months.   Yes  [provider]    Scheduled Meds: . amLODipine  10 mg Oral Daily  . apixaban  5 mg Oral BID  . Chlorhexidine Gluconate Cloth  6 each Topical Daily  . docusate  100 mg Oral BID  . dronabinol  2.5 mg Oral BID AC  . feeding supplement (ENSURE ENLIVE)  237 mL Oral TID BM  . gabapentin  100 mg Oral QHS  . LORazepam  0.25 mg Intravenous Once  . mirtazapine  7.5 mg Oral QHS  . multivitamin with minerals  1 tablet Oral Daily  . pioglitazone  15 mg Oral Daily  . sodium chloride flush  3 mL Intravenous Once  . traMADol  50 mg Oral BID   Infusions:  PRN Meds: ALPRAZolam, meclizine, ondansetron, prochlorperazine, sorbitol  Allergies as of 09/12/2019  . (No Known Allergies)    Family History  Problem Relation Age of Onset  . Hypertension Mother   . Hypertension Father   . Cancer Neg Hx     Social History   Socioeconomic History  . Marital status: Divorced    Spouse name: Not on file  . Number of children: 1  . Years of education: Not on file  . Highest education level: Not on file  Occupational History  . Not on file  Social Needs  . Financial resource strain: Not hard at all  . Food insecurity    Worry: Never true    Inability: Never true  . Transportation needs    Medical: Yes    Non-medical: Yes  Tobacco Use  . Smoking status: Former Smoker    Packs/day: 1.00    Years: 40.00    Pack years: 40.00    Types: Cigarettes    Quit date: 02/12/2011    Years since quitting: 8.6  . Smokeless tobacco: Never Used  Substance and Sexual Activity  . Alcohol use: Yes    Comment: sometimes daily, some occasional  . Drug use: No  . Sexual activity: Not Currently  Lifestyle  . Physical activity    Days per week: 0 days    Minutes per session: 0 min  . Stress: Only a little  Relationships  . Social connections    Talks on phone: More than three times a week    Gets together: More than three times a week    Attends religious service: Never    Active member of  club or organization: Yes    Attends meetings of clubs or organizations: More than 4 times per year    Relationship status: Divorced  . Intimate partner violence    Fear of current or ex partner: Not on file    Emotionally abused: Not on file    Physically abused: Not on file    Forced sexual activity: Not on file  Other Topics Concern  . Not on file  Social History Narrative   Occupation: Network engineer at [employer]   Current smoker   single    REVIEW OF SYSTEMS: Constitutional: Some weakness and fatigue.  Not new. ENT:  No nose bleeds Pulm: Chronic dyspnea.  No cough. CV:  No palpitations, no LE edema.  No angina GU:  No hematuria, no frequency GI: See HPI. Heme: Denies unusual bleeding or bruising. Transfusions: Last transfusions noted in epic were in 2016. Neuro:  No headaches, no peripheral tingling or numbness.  Seizures, no syncope. Derm:  No itching, no rash or sores.  Endocrine:  No sweats or chills.  No polyuria or dysuria Immunization: Flu vaccination on 09/04/2019. Travel:  None beyond local counties in last few months.    PHYSICAL EXAM: Vital signs in last 24 hours: Vitals:   09/18/19 0741 09/18/19 1120  BP: (!) 109/52 (!) 107/59  Pulse: 90 93  Resp: 17 17  Temp: 97.8 F (36.6 C) 98.6 F (37 C)  SpO2: 94% 96%   Wt Readings from Last 3 Encounters:  09/12/19 69.5 kg  09/04/19 70.5 kg  08/27/19 72.4 kg    General: Pleasant, comfortable, nonacutely ill-appearing elderly AAF. Head: No facial asymmetry or swelling, no signs of head trauma. Eyes: No scleral icterus.  No conjunctival pallor.  EOMI. Ears: Not hard of hearing Nose: No congestion or discharge Mouth: Oral mucosa moist, pink, clear.  Tongue midline. Neck: No JVD, no  masses, no thyromegaly. Lungs: No labored breathing or cough.  Lungs clear bilaterally. Heart: RRR.  No MRG.  S1, S2 present. Abdomen: Soft.  Not tender.  Not distended.  There is some fullness in the right upper quadrant.  Bowel  sounds active..   Rectal: Deferred Musc/Skeltl: No joint redness or swelling. Extremities: No CCE. Neurologic: Alert.  Oriented x3.  Appropriate.  No tremors.  No gross deficits or weakness. Skin: No rash, no sores or suspicious lesions. Tattoos: None observed Nodes: No cervical adenopathy Psych: Calm, pleasant, cooperative.  Fluid speech.  Intake/Output from previous day: 11/16 0701 - 11/17 0700 In: 0  Out: 625 [Urine:625] Intake/Output this shift: Total I/O In: 60 [P.O.:60] Out: -   LAB RESULTS: Recent Labs    09/16/19 0700 09/18/19 0409  WBC 7.7 9.7  HGB 9.4* 9.3*  HCT 29.4* 29.4*  PLT 264 265   BMET Lab Results  Component Value Date   NA 137 09/18/2019   NA 136 09/17/2019   NA 134 (L) 09/16/2019   K 4.8 09/18/2019   K 4.4 09/17/2019   K 4.6 09/16/2019   CL 105 09/18/2019   CL 106 09/17/2019   CL 103 09/16/2019   CO2 21 (L) 09/18/2019   CO2 21 (L) 09/17/2019   CO2 21 (L) 09/16/2019   GLUCOSE 88 09/18/2019   GLUCOSE 85 09/17/2019   GLUCOSE 89 09/16/2019   BUN 34 (H) 09/18/2019   BUN 33 (H) 09/17/2019   BUN 34 (H) 09/16/2019   CREATININE 2.61 (H) 09/18/2019   CREATININE 2.69 (H) 09/17/2019   CREATININE 3.23 (H) 09/16/2019   CALCIUM 9.8 09/18/2019   CALCIUM 9.2 09/17/2019   CALCIUM 9.1 09/16/2019   LFT Recent Labs    09/16/19 0700 09/17/19 0749 09/18/19 0411  ALBUMIN 2.3* 2.2* 2.1*   PT/INR Lab Results  Component Value Date   INR 1.2 09/12/2019   INR 1.00 06/19/2018   INR 1.07 10/17/2015   Hepatitis Panel No results for input(s): HEPBSAG, HCVAB, HEPAIGM, HEPBIGM in the last 72 hours. C-Diff No components found for: CDIFF Lipase  No results found for: LIPASE  Drugs of Abuse  No results found for: LABOPIA, COCAINSCRNUR, LABBENZ, AMPHETMU, THCU, LABBARB   RADIOLOGY STUDIES: Dg Esophagus W Single Cm (sol Or Thin Ba)  Result Date: 09/17/2019 CLINICAL DATA:  Solid food dysphagia with regurgitation. EXAM: ESOPHOGRAM/BARIUM SWALLOW  TECHNIQUE: Single contrast examination was performed using  thin barium. FLUOROSCOPY TIME:  Fluoroscopy Time: 1 minutes and 42 seconds of low-dose pulsed fluoroscopy. Radiation Exposure Index (if provided by the fluoroscopic device): Number of Acquired Spot Images: 19.6 mGy COMPARISON:  Chest CT 09/12/2019. Neck CT 01/03/2019. FINDINGS: Study is mildly limited by the patient's limited mobility. Examination was performed supine, semi erect and in the right lateral decubitus positions. The patient swallowed the barium without difficulty. The esophageal motility is within normal limits. No aspiration was identified. There is an esophageal web at the level of the thoracic inlet which prevented the passage of a 13 mm barium tablet. No mucosal ulceration identified in this region. No other esophageal strictures are identified. There is a small distal esophageal epiphrenic diverticulum. Port-A-Cath and scattered sclerotic osseous lesions are noted in this patient with a history of metastatic breast cancer. IMPRESSION: 1. Restrictive focal esophageal web at the level of the thoracic inlet preventing the passage of a 13 mm barium tablet, likely symptomatic. 2. Small distal esophageal epiphrenic diverticulum. 3. No mucosal ulceration or other significant findings. Electronically Signed   By:  Richardean Sale M.D.   On: 09/17/2019 09:55      IMPRESSION:   *    Dysphagia.  Esophageal web and distal esophageal epiphrenic diverticulum per esophagram yesterday 7/16.  I do not see any recent anemia studies in epic  *     Chronic Eliquis.  For history TIA and CVA.  Not on hold, last dose was at 840 this morning 09-26-23.  *   Chronic constipation.  Noncontrast CT 08/10/2019 showed colonic wall thickening, related possibly to diverticular disease versus primary colon cancer versus mets from breast cancer  *    Normocytic anemia.  hgb fluctuating throughout the year.  Previously macrocytic with low of 8, MCV of 112 in June.   Has now drifted down to 9.3 with MCV of 89  *     Widely metastatic breast cancer, mets to liver, adrenal, bones, lung. Restaging CTAP with out contrast 08/10/2019 showing new, enlarging hepatic mets compared with PET scan 04/2019.  Enlargement of bilateral pulmonary masses and nodules.  Stable multifocal bony mets.  Stable left adrenal mets.  Scattered colonic diverticulosis . Abnormal colonic wall thickening in the distal colon, ? diverticular disease though metastatic breast cancer or primary colonic neoplasm could have similar appearance. Despite the CTs findings, she is actually doing pretty well  PLAN:     *    EGD with esophageal dilatation.  This needs to be done off Eliquis.  Needs to be held for 2 days which puts her EGD on 11/19 at the soonest. Given the abnormal appearance of colon on CT last month and persistent constipation, ?  Pursue colonoscopy as well  *   I messaged Dr. Marylyn Ishihara regarding holding the Eliquis.  He will hold the med.    *   D2 diet for now  *   Added daily dulcolax and double dose Miralax.    *   Check iron, B12 and folate studies in AM.    Azucena Freed  09/26/19, 1:51 PM Phone [phone]  I have reviewed the entire case in detail with the above APP and discussed the plan in detail.  Therefore, I agree with the diagnoses recorded above. In addition,  I have personally interviewed and examined the patient and have personally reviewed any abdominal/pelvic CT scan images.  I also reviewed the most recent CT scan neck, and yesterday's upper GI series images.  My additional thoughts are as follows:  About 2 months of dysphagia to solids and tablets, upper GI series seems to show a fixed narrowing in the proximal esophagus.  It is difficult to tell whether it is intrinsic or extrinsic, and therefore whether or not it is amenable to endoscopic dilation.  Given the degree of difficulty she is having, we have offered her an upper endoscopy with possible dilation  (most likely fluoroscopy and wire-guided bougie dilation) if it is an intrinsic stricture.  She had radiation to the upper chest and neck, but also could have metastatic soft tissue or nodal disease causing extrinsic compression.  She must be off oral anticoagulation at least 48 hours before the procedure given her chronic renal failure. We are tentatively making plans for this on either 11/19 or 11/20, depending upon the scheduleavailability. In the meantime, dietary and medication adjustments have been made to allow safer swallowing.  Upper endoscopy with dilation was described to her in detail along with risks and benefits.  The benefits and risks of the planned procedure were described in detail with  the patient or (when appropriate) their health care proxy.  Risks were outlined as including, but not limited to, bleeding, infection, perforation, adverse medication reaction leading to cardiac or pulmonary decompensation, pancreatitis (if ERCP).  The limitation of incomplete mucosal visualization was also discussed.  No guarantees or warranties were given.  Patient at increased risk for cardiopulmonary complications of procedure due to medical comorbidities.  She wishes to proceed, we will check back with more concrete scheduling plans when available.   Nelida Meuse III Office:[phone]

## 2019-09-18 NOTE — Progress Notes (Signed)
Physical Therapy Treatment Patient Details Name: [name] [name] MRN: [id number] DOB: 06/18/1941 Today's Date: 09/18/2019    History of Present Illness [name] [name] is a 78 y.o. female with medical history significant of cancer in 2002 treated successfully.  Currently with a recurrence with widely metastatic disease involving liver lungs bone.  She is still undergoing treatment for her cancer and had a DVT in the R UE.  She presented with R facial droop and weakness over couple of weeks and now with some speech and swallowing issues.    PT Comments    Pt performed gt training in room from bed to bathroom.  She performed sit to stand x2 and left sitting on commode as she was working on having a BM.  She continues to require min assistance from bed, transfers, and ambulation and will continue to benefit from SNF placement at d/c to improve strength and function.  Plan for progress of gt and introducing strengthening to B LEs next session.  Today's session was limited due to discomfort in abdomen and need to sit longer on the commode.    Follow Up Recommendations  SNF;Supervision for mobility/OOB     Equipment Recommendations  None recommended by PT    Recommendations for Other Services       Precautions / Restrictions Precautions Precautions: Fall Restrictions Weight Bearing Restrictions: No Other Position/Activity Restrictions: DVT R UE    Mobility  Bed Mobility Overal bed mobility: Needs Assistance Bed Mobility: Supine to Sit;Sit to Supine     Supine to sit: Min assist     General bed mobility comments: Increased time and effort to move to edge of bed.  Pt able to move B LEs to edge of bed but required assistance to elevate trunk into sitting.  Transfers Overall transfer level: Needs assistance Equipment used: Rolling walker (2 wheeled) Transfers: Sit to/from Stand Sit to Stand: Min assist         General transfer comment: Cues for pushing with LUE to and from  seated surface.  Pt performed from bed and from commode for pericare.  Upon second standing trial she reports she needed to sit longer on commode.  Pt left in bathroom with call string.  Ambulation/Gait Ambulation/Gait assistance: Min assist Gait Distance (Feet): 12 Feet Assistive device: Rolling walker (2 wheeled) Gait Pattern/deviations: Step-to pattern;Trunk flexed;Decreased stride length Gait velocity: decreased   General Gait Details: Pt performed short trial from bed to bathroom.  Pt required constant cues for RW safety and body position to step into RW vs. pushing device out too far.   Stairs             Wheelchair Mobility    Modified Rankin (Stroke Patients Only)       Balance Overall balance assessment: Needs assistance Sitting-balance support: Feet supported Sitting balance-Leahy Scale: Good       Standing balance-Leahy Scale: Fair Standing balance comment: UE support for balance                            Cognition Arousal/Alertness: Awake/alert Behavior During Therapy: WFL for tasks assessed/performed Overall Cognitive Status: Within Functional Limits for tasks assessed                                 General Comments: Not formally assessed but able to function within tasks assessed.      Exercises  General Comments        Pertinent Vitals/Pain Pain Assessment: 0-10 Pain Score: 4  Pain Location: back with activity Pain Descriptors / Indicators: Sore;Aching Pain Intervention(s): Monitored during session;Repositioned    Home Living                      Prior Function            PT Goals (current goals can now be found in the care plan section) Acute Rehab PT Goals Patient Stated Goal: to go to reahb, get stronger Potential to Achieve Goals: Good Progress towards PT goals: Progressing toward goals    Frequency    Min 4X/week      PT Plan Current plan remains appropriate     Co-evaluation              AM-PAC PT "6 Clicks" Mobility   Outcome Measure  Help needed turning from your back to your side while in a flat bed without using bedrails?: A Little Help needed moving from lying on your back to sitting on the side of a flat bed without using bedrails?: A Little Help needed moving to and from a bed to a chair (including a wheelchair)?: A Little Help needed standing up from a chair using your arms (e.g., wheelchair or bedside chair)?: A Little Help needed to walk in hospital room?: A Little Help needed climbing 3-5 steps with a railing? : A Lot 6 Click Score: 17    End of Session Equipment Utilized During Treatment: Gait belt Activity Tolerance: Patient limited by fatigue Patient left: in bed;with bed alarm set;with call bell/phone within reach Nurse Communication: Mobility status PT Visit Diagnosis: Unsteadiness on feet (R26.81);Muscle weakness (generalized) (M62.81);Difficulty in walking, not elsewhere classified (R26.2)     Time: 7939-0300 PT Time Calculation (min) (ACUTE ONLY): 13 min  Charges:  $Gait Training: 8-22 mins                     Erasmo Leventhal , PTA Acute Rehabilitation Services Pager [phone] Office [phone]     Lynwood Kubisiak Eli Hose 09/18/2019, 2:53 PM

## 2019-09-19 LAB — FOLATE: Folate: 3.9 ng/mL — ABNORMAL LOW (ref >5.9)

## 2019-09-19 LAB — IRON AND TIBC
Iron: 44 ug/dL (ref 28–170)
Saturation Ratios: 30 % (ref 10.4–31.8)
TIBC: 146 ug/dL — ABNORMAL LOW (ref 250–450)
UIBC: 102 ug/dL

## 2019-09-19 LAB — VITAMIN B12: Vitamin B-12: 1768 pg/mL — ABNORMAL HIGH (ref 180–914)

## 2019-09-19 LAB — FERRITIN: Ferritin: 1033 ng/mL — ABNORMAL HIGH (ref 11–307)

## 2019-09-19 LAB — RETICULOCYTES
Immature Retic Fract: 26.8 % — ABNORMAL HIGH (ref 2.3–15.9)
RBC.: 3.35 MIL/uL — ABNORMAL LOW (ref 3.87–5.11)
Retic Count, Absolute: 64.7 10*3/uL (ref 19.0–186.0)
Retic Ct Pct: 1.9 % (ref 0.4–3.1)

## 2019-09-19 NOTE — Plan of Care (Signed)
  Problem: Education: Goal: Knowledge of General Education information will improve Description: Including pain rating scale, medication(s)/side effects and non-pharmacologic comfort measures Outcome: Progressing   Problem: Education: Goal: Knowledge of disease or condition will improve Outcome: Progressing Goal: Knowledge of secondary prevention will improve Outcome: Progressing Goal: Knowledge of patient specific risk factors addressed and post discharge goals established will improve Outcome: Progressing Goal: Individualized Educational Video(s) Outcome: Progressing   Problem: Coping: Goal: Will verbalize positive feelings about self Outcome: Progressing Goal: Will identify appropriate support needs Outcome: Progressing   Problem: Health Behavior/Discharge Planning: Goal: Ability to manage health-related needs will improve Outcome: Progressing   Problem: Self-Care: Goal: Ability to participate in self-care as condition permits will improve Outcome: Progressing Goal: Verbalization of feelings and concerns over difficulty with self-care will improve Outcome: Progressing Goal: Ability to communicate needs accurately will improve Outcome: Progressing   Problem: Nutrition: Goal: Risk of aspiration will decrease Outcome: Progressing Goal: Dietary intake will improve Outcome: Progressing   Ival Bible, BSN, RN

## 2019-09-19 NOTE — Progress Notes (Signed)
PROGRESS NOTE   [name] [name]  DXI:[id number]    DOB: [birth year]    DOA: 09/12/2019  PCP: Cassandria Anger, MD   I have briefly reviewed patients previous medical records in [facility].  Chief Complaint  Patient presents with  . Code Stroke    Brief Narrative:  78 year old female with history of recurrent widely metastatic breast cancer on chemotherapy, with metastasis to lungs, bones and liver,, history of palliative radiation to the neck and subclavian area, chronic right arm weakness and stiffness, hypertension, dyslipidemia, osteoarthritis, history of DVT on Eliquis presented to the emergency room yesterday with sudden onset right face numbness, and some left leg numbness, Neurology seen and signed off 11/12 and didn't think consistent of CVA. Dysphagia being evaluated by [facility] with plans for EGD after Eliquis wash out.   Assessment & Plan:   Active Problems:   Essential hypertension   Situational anxiety   Breast cancer metastasized to bone, right (HCC)   Chest pain   Facial weakness   Protein-calorie malnutrition, severe   Transient right facial weakness, left leg weakness - facial weaknes and LLE weakness have resolved - MRI negative for acute ischemic events or metastasis - SLP evaluation: Dys 2 diet w/ thin liquids - TTE: EF 60 to 65% - Resumed apixaban, she was originally on this for right arm DVT - PT/OT rec SNF; family agreeable, working on options. SNF when medically ready.  Bilateral pleural effusion, dyspnea on exertion - Could be malignant effusions, has widely metastatic pulmonary disease - echo EF 60-65%, trivial pericardial effusion;comfortableon RA - stable small-to-moderated R pleural effusion; left pleural effusion is small; no PNA seen on CT, but stable b/l nodules are noted  Recurrent widely metastatic breast cancer - Prognosis is poor, anticipate she will need a goals of care discussion in the  near future, followed by Dr. Lindi Adie, at this time insists on full code and full scope of treatment - Receiving palliative chemotherapy with Keytruda infusions every 3 weeks, last treatment was 11/3  Stage 3-4 chronic kidney disease - Baseline creatinine around 2-2.4 -Scr is down to 2.61 and stable; encourage PO intake, monitor - Follow BMP periodically  Chronic right arm DVT  - chronic right arm weakness and stiffness, -continueEliquis> currently held for EGD   Essential Hypertension - Controlled, ARB on hold - continue holding ARB  Dysphagia - [facility] follow up appreciated. Last dose of Eliquis: 11/17. Plan for EGD 11/20  Body mass index is 27.14 kg/m.  Nutritional Status Nutrition Problem: Severe Malnutrition Etiology: chronic illness(metastatic breast cancer) Signs/Symptoms: energy intake < 75% for > or equal to 1 month, percent weight loss Percent weight loss: 13.8 % Interventions: Ensure Enlive (each supplement provides 350kcal and 20 grams of protein), MVI  DVT prophylaxis: SCD's Code Status: Full Family Communication: None at bedside Disposition: DC to SNF when medically ready.   Consultants:  Neurology [facility]   Procedures:  None  Antimicrobials:  None    Subjective: Reports feeling much better after large BM last night. No abd pain. Tolerating modified diet better.   Objective:  Vitals:   09/19/19 0737 09/19/19 1118 09/19/19 1616 09/19/19 1700  BP: (!) 103/47 (!) 114/51 (!) 115/59 125/81  Pulse: 87 87 91 89  Resp: 18 18 18 18   Temp: 98.7 F (37.1 C) 98.2 F (36.8 C) 98.3 F (36.8 C)   TempSrc: Oral Oral Oral   SpO2: 98% 95% 97%   Weight:  Examination:  General exam: Elderly female lying comfortably in bed. Respiratory system: Clear to auscultation. Respiratory effort normal. Cardiovascular system: S1 & S2 heard, RRR. No JVD, murmurs, rubs, gallops or clicks. Trace ankle edema. Tele: SR.  Occasional NSSVT. Gastrointestinal system: Abdomen is nondistended, soft and nontender. No organomegaly or masses felt. Normal bowel sounds heard. Central nervous system: Alert and oriented. No focal neurological deficits. Extremities: Symmetric 5 x 5 power. Skin: No rashes, lesions or ulcers Psychiatry: Judgement and insight appear normal. Mood & affect appropriate.     Data Reviewed: I have personally reviewed following labs and imaging studies   CBC: Recent Labs  Lab 09/13/19 1146 09/15/19 1012 09/16/19 0700 09/18/19 0409  WBC 9.3 8.8 7.7 9.7  NEUTROABS  --  6.0 5.9 7.1  HGB 10.8* 10.2* 9.4* 9.3*  HCT 34.1* 32.1* 29.4* 29.4*  MCV 90.2 89.7 90.5 89.1  PLT 277 280 264 354    Basic Metabolic Panel: Recent Labs  Lab 09/13/19 1146 09/14/19 1300 09/15/19 1012 09/16/19 0700 09/17/19 0749 09/18/19 0409 09/18/19 0411  NA 137  --  134* 134* 136  --  137  K 4.2  --  4.3 4.6 4.4  --  4.8  CL 103  --  103 103 106  --  105  CO2 20*  --  22 21* 21*  --  21*  GLUCOSE 110*  --  118* 89 85  --  88  BUN 24*  --  28* 34* 33*  --  34*  CREATININE 2.32*  --  2.95* 3.23* 2.69*  --  2.61*  CALCIUM 9.2  --  9.0 9.1 9.2  --  9.8  MG  --  2.6*  --  2.9*  --  2.8*  --   PHOS  --   --  3.7 4.0 3.5  --  3.6    Liver Function Tests: Recent Labs  Lab 09/15/19 1012 09/16/19 0700 09/17/19 0749 09/18/19 0411  ALBUMIN 2.6* 2.3* 2.2* 2.1*    CBG: No results for input(s): GLUCAP in the last 168 hours.  Recent Results (from the past 240 hour(s))  SARS CORONAVIRUS 2 (TAT 6-24 HRS) Nasopharyngeal Nasopharyngeal Swab     Status: None   Collection Time: 09/12/19  6:52 PM   Specimen: Nasopharyngeal Swab  Result Value Ref Range Status   SARS Coronavirus 2 NEGATIVE NEGATIVE Final    Comment: (NOTE) SARS-CoV-2 target nucleic acids are NOT DETECTED. The SARS-CoV-2 RNA is generally detectable in upper and lower respiratory specimens during the acute phase of infection. Negative results do  not preclude SARS-CoV-2 infection, do not rule out co-infections with other pathogens, and should not be used as the sole basis for treatment or other patient management decisions. Negative results must be combined with clinical observations, patient history, and epidemiological information. The expected result is Negative. Fact Sheet for Patients: SugarRoll.be Fact Sheet for Healthcare Providers: https://www.woods-mathews.com/ This test is not yet approved or cleared by the [place] FDA and  has been authorized for detection and/or diagnosis of SARS-CoV-2 by FDA under an Emergency Use Authorization (EUA). This EUA will remain  in effect (meaning this test can be used) for the duration of the COVID-19 declaration under Section 56 4(b)(1) of the Act, 21 U.S.C. section 360bbb-3(b)(1), unless the authorization is terminated or revoked sooner. Performed at [facility], [place] [address], [place], [place] [address]   SARS CORONAVIRUS 2 (TAT 6-24 HRS) Nasopharyngeal Nasopharyngeal Swab     Status: None  Collection Time: 09/17/19  4:37 PM   Specimen: Nasopharyngeal Swab  Result Value Ref Range Status   SARS Coronavirus 2 NEGATIVE NEGATIVE Final    Comment: (NOTE) SARS-CoV-2 target nucleic acids are NOT DETECTED. The SARS-CoV-2 RNA is generally detectable in upper and lower respiratory specimens during the acute phase of infection. Negative results do not preclude SARS-CoV-2 infection, do not rule out co-infections with other pathogens, and should not be used as the sole basis for treatment or other patient management decisions. Negative results must be combined with clinical observations, patient history, and epidemiological information. The expected result is Negative. Fact Sheet for Patients: SugarRoll.be Fact Sheet for Healthcare Providers: https://www.woods-mathews.com/ This test is not yet  approved or cleared by the [place] FDA and  has been authorized for detection and/or diagnosis of SARS-CoV-2 by FDA under an Emergency Use Authorization (EUA). This EUA will remain  in effect (meaning this test can be used) for the duration of the COVID-19 declaration under Section 56 4(b)(1) of the Act, 21 U.S.C. section 360bbb-3(b)(1), unless the authorization is terminated or revoked sooner. Performed at [facility], [place] [address], [place], [place] [address]       Radiology Studies: No results found.        Scheduled Meds: . amLODipine  10 mg Oral Daily  . bisacodyl  20 mg Oral Daily  . Chlorhexidine Gluconate Cloth  6 each Topical Daily  . docusate  100 mg Oral BID  . dronabinol  2.5 mg Oral BID AC  . feeding supplement (ENSURE ENLIVE)  237 mL Oral TID BM  . gabapentin  100 mg Oral QHS  . LORazepam  0.25 mg Intravenous Once  . mirtazapine  7.5 mg Oral QHS  . multivitamin with minerals  1 tablet Oral Daily  . pioglitazone  15 mg Oral Daily  . polyethylene glycol  34 g Oral Daily  . sodium chloride flush  3 mL Intravenous Once  . traMADol  50 mg Oral BID   Continuous Infusions:   LOS: 6 days     Vernell Leep, MD, [place], [facility]. [facility]  To contact the attending provider between 7A-7P or the covering provider during after hours 7P-7A, please log into the web site [email or web address] and access using universal [place] password for that web site. If you do not have the password, please call the hospital operator.  09/19/2019, 6:17 PM

## 2019-09-19 NOTE — Plan of Care (Signed)
Patient progressing towards plan of care goals. 

## 2019-09-19 NOTE — Progress Notes (Addendum)
Daily Rounding Note  09/19/2019, 9:18 AM  LOS: 6 days   SUBJECTIVE:   Chief complaint: Dysphagia.      Patient did not eat much if anything off the tray today.  There were eggs, cream of wheat and she did not care for the latter and felt she could not eat the eggs. Had a bowel movement at about 1230 this morning, it was hard.  OBJECTIVE:         Vital signs in last 24 hours:    Temp:  [97.8 F (36.6 C)-98.7 F (37.1 C)] 98.7 F (37.1 C) (11/18 0737) Pulse Rate:  [87-99] 87 (11/18 0737) Resp:  [16-20] 18 (11/18 0737) BP: (103-125)/(47-65) 103/47 (11/18 0737) SpO2:  [94 %-98 %] 98 % (11/18 0737) Last BM Date: 09/19/19 Filed Weights   09/12/19 1432  Weight: 69.5 kg   General: Resting comfortably.  Somewhat frail, elderly but not acutely ill-appearing. Heart: RRR. Chest: No labored breathing, no cough.  Lungs clear Abdomen: Soft.  Not tender or distended.  Normal but hypoactive bowel sounds. Extremities: No CCE. Neuro/Psych: Alert.  Appropriate.  Fluid speech.  No tremors or involuntary movements..  Intake/Output from previous day: 11/17 0701 - 11/18 0700 In: 120 [P.O.:120] Out: -   Intake/Output this shift: No intake/output data recorded.  Lab Results: Recent Labs    09/18/19 0409  WBC 9.7  HGB 9.3*  HCT 29.4*  PLT 265   BMET Recent Labs    09/17/19 0749 09/18/19 0411  NA 136 137  K 4.4 4.8  CL 106 105  CO2 21* 21*  GLUCOSE 85 88  BUN 33* 34*  CREATININE 2.69* 2.61*  CALCIUM 9.2 9.8   LFT Recent Labs    09/17/19 0749 09/18/19 0411  ALBUMIN 2.2* 2.1*   PT/INR No results for input(s): LABPROT, INR in the last 72 hours. Hepatitis Panel No results for input(s): HEPBSAG, HCVAB, HEPAIGM, HEPBIGM in the last 72 hours.  Studies/Results: Dg Esophagus W Single Cm (sol Or Thin Ba)  Result Date: 09/17/2019 CLINICAL DATA:  Solid food dysphagia with regurgitation. EXAM: ESOPHOGRAM/BARIUM  SWALLOW TECHNIQUE: Single contrast examination was performed using  thin barium. FLUOROSCOPY TIME:  Fluoroscopy Time: 1 minutes and 42 seconds of low-dose pulsed fluoroscopy. Radiation Exposure Index (if provided by the fluoroscopic device): Number of Acquired Spot Images: 19.6 mGy COMPARISON:  Chest CT 09/12/2019. Neck CT 01/03/2019. FINDINGS: Study is mildly limited by the patient's limited mobility. Examination was performed supine, semi erect and in the right lateral decubitus positions. The patient swallowed the barium without difficulty. The esophageal motility is within normal limits. No aspiration was identified. There is an esophageal web at the level of the thoracic inlet which prevented the passage of a 13 mm barium tablet. No mucosal ulceration identified in this region. No other esophageal strictures are identified. There is a small distal esophageal epiphrenic diverticulum. Port-A-Cath and scattered sclerotic osseous lesions are noted in this patient with a history of metastatic breast cancer. IMPRESSION: 1. Restrictive focal esophageal web at the level of the thoracic inlet preventing the passage of a 13 mm barium tablet, likely symptomatic. 2. Small distal esophageal epiphrenic diverticulum. 3. No mucosal ulceration or other significant findings. Electronically Signed   By: Richardean Sale M.D.   On: 09/17/2019 09:55    ASSESMENT:   *    Dysphagia.  Esophageal web, epiphrenic esophageal diverticulum.  *   Chronic Eliquis.  Last dose 11/17 in the morning.  *  Chronic constipation.  Colonic wall thickening observed on CT scan.  *     Widely metastatic breast cancer.  *   AKI, CKD.   PLAN   *   Plan EGD with possible esophageal dilatation on 11/20.  Patient agreeable, understands risks to include bleeding, esophageal tear/perforation, difficulty with sedation resulting in respiratory problems.    [name] [name]  09/19/2019, 9:18 AM Phone [phone]  I have discussed the case  with the PA, and that is the plan I formulated. I personally interviewed and examined the patient.  CC: esophageal dysphagia  Plan is for EGD with possible dilation on 11/20.  Please keep patient off oral anticoagulation until then.    Nelida Meuse III Office: [phone]

## 2019-09-19 NOTE — Progress Notes (Signed)
OT Cancellation Note  Patient Details Name: [name] [name] MRN: [id number] DOB: 09-Jun-1941   Cancelled Treatment:    Reason Eval/Treat Not Completed: Patient declined, no reason specified(Pt just finished with PT, reports too fatigued for more therapy. Will return later as schedule allows. )  Gus Rankin, OT Student  Gus Rankin 09/19/2019, 3:21 PM

## 2019-09-19 NOTE — Progress Notes (Signed)
Physical Therapy Treatment Patient Details Name: [name] [name] MRN: [id number] DOB: [birth year] Today's Date: 09/19/2019    History of Present Illness [name] [name] is a 78 y.o. female with medical history significant of cancer in 2002 treated successfully.  Currently with a recurrence with widely metastatic disease involving liver lungs bone.  She is still undergoing treatment for her cancer and had a DVT in the R UE.  She presented with R facial droop and weakness over couple of weeks and now with some speech and swallowing issues.    PT Comments    Pt performed gt training but limited due to urinary incontinence.  Performed repeated sit to stand for pericare and assisted patient in upper and lower body dressing.  Pt continues to fatigue quickly.  Plan remains for SNF at d/c to continue to improve strength and function before returning home.     Follow Up Recommendations  SNF;Supervision for mobility/OOB     Equipment Recommendations  None recommended by PT    Recommendations for Other Services       Precautions / Restrictions Precautions Precautions: Fall Restrictions Weight Bearing Restrictions: No Other Position/Activity Restrictions: DVT R UE    Mobility  Bed Mobility Overal bed mobility: Needs Assistance Bed Mobility: Supine to Sit;Sit to Supine     Supine to sit: Mod assist Sit to supine: Mod assist   General bed mobility comments: Pt required assistance to elevate trunk into sitting after moving LEs to edge of bed.  Pt required assistance to lower trunk and boost to [facility].  She was able to lift LEs back to bed against gravity with increased time and effort.  Transfers Overall transfer level: Needs assistance Equipment used: Rolling walker (2 wheeled) Transfers: Sit to/from Stand Sit to Stand: Min assist;Mod assist         General transfer comment: Pt performed sit to stand from bed and chair for a total of x3 transfers.  She required min to moderate  assistance to achieve standing.  Ambulation/Gait Ambulation/Gait assistance: Min assist Gait Distance (Feet): 14 Feet(+ 10 ft) Assistive device: Rolling walker (2 wheeled) Gait Pattern/deviations: Step-to pattern;Trunk flexed;Decreased stride length Gait velocity: decreased   General Gait Details: Pt required cues for upper trunk control and to stay closer to RW for safety.  Pt limited due to urinary incontinence in standing.   Stairs             Wheelchair Mobility    Modified Rankin (Stroke Patients Only)       Balance Overall balance assessment: Needs assistance Sitting-balance support: Feet supported Sitting balance-Leahy Scale: Good       Standing balance-Leahy Scale: Fair Standing balance comment: UE support for balance                            Cognition Arousal/Alertness: Awake/alert Behavior During Therapy: WFL for tasks assessed/performed Overall Cognitive Status: Within Functional Limits for tasks assessed Area of Impairment: Memory                     Memory: Decreased short-term memory         General Comments: Not formally assessed but able to function within tasks assessed.      Exercises      General Comments        Pertinent Vitals/Pain Pain Assessment: No/denies pain Pain Score: 4  Pain Location: back with activity Pain Descriptors / Indicators: Sore;Aching Pain Intervention(s):  Monitored during session;Repositioned    Home Living                      Prior Function            PT Goals (current goals can now be found in the care plan section) Acute Rehab PT Goals Patient Stated Goal: to gte stronger Potential to Achieve Goals: Good Progress towards PT goals: Progressing toward goals    Frequency    Min 4X/week      PT Plan Current plan remains appropriate    Co-evaluation              AM-PAC PT "6 Clicks" Mobility   Outcome Measure  Help needed turning from your back to  your side while in a flat bed without using bedrails?: A Little Help needed moving from lying on your back to sitting on the side of a flat bed without using bedrails?: A Little Help needed moving to and from a bed to a chair (including a wheelchair)?: A Lot Help needed standing up from a chair using your arms (e.g., wheelchair or bedside chair)?: A Little Help needed to walk in hospital room?: A Little Help needed climbing 3-5 steps with a railing? : A Lot 6 Click Score: 16    End of Session Equipment Utilized During Treatment: Gait belt Activity Tolerance: Patient limited by fatigue Patient left: in bed;with bed alarm set;with call bell/phone within reach Nurse Communication: Mobility status PT Visit Diagnosis: Unsteadiness on feet (R26.81);Muscle weakness (generalized) (M62.81);Difficulty in walking, not elsewhere classified (R26.2)     Time: 5929-2446 PT Time Calculation (min) (ACUTE ONLY): 32 min  Charges:  $Gait Training: 8-22 mins $Therapeutic Activity: 8-22 mins                     Erasmo Leventhal , PTA Acute Rehabilitation Services Pager [phone] Office [phone]     Winnell Bento Eli Hose 09/19/2019, 4:06 PM

## 2019-09-19 NOTE — Progress Notes (Signed)
  Speech Language Pathology Treatment: Dysphagia  Patient Details Name: [name] [name] MRN: [id number] DOB: 12/12/1940 Today's Date: 09/19/2019 Time: 1115-1130 SLP Time Calculation (min) (ACUTE ONLY): 15 min  Assessment / Plan / Recommendation Clinical Impression  Pt seen at bedside to assess diet tolerance and provide education. Pt continues to tolerate dys 2 solids and thin liquids with crushed meds. SLP reiterated education regarding the rationale for these recommendations. Esophageal web noted on esophageal work up. Smaller bites and crushed meds will facilitate esophageal clearing. Pt was noted to have green oral care swab soaking in a small amount of liquid. She indicated that this had been in her mouth already. SLP provided education regarding the importance of using oral swabs only once due to multiplication of bacteria when left to sit in water, which increases infection risk significantly. Educating information posted within pt visual field. Nurse tech and RN also educated.   Pt has met all goals, and will be discharged from ST at this time. SLP is available if needs arise in the future.   HPI HPI: 78yo female admitted 09/12/2019 with right facial weakness, swallowing difficulty, inability to keep food down. PMH: metastatic cancer (liver, lungs, bone), DVT, CVA, dyslipidemia, HTN, TIA x2 (2011). MRI = No acute intracranial abnormality or metastatic disease. Old thalamic and pontine small vessel infarcts. Generalized atrophy and moderate chronic small vessel disease.      SLP Plan  Discharge SLP treatment due to all goals met      Recommendations  Diet recommendations: Dysphagia 2 (fine chop);Thin liquid Liquids provided via: Cup;Straw Medication Administration: Crushed with puree Supervision: Patient able to self feed Compensations: Small sips/bites;Slow rate;Follow solids with liquid Postural Changes and/or Swallow Maneuvers: Seated upright 90 degrees;Upright 30-60 min after  meal                Oral Care Recommendations: Oral care BID Follow up Recommendations: None SLP Visit Diagnosis: Dysphagia, pharyngoesophageal phase (R13.14) Plan: Discharge SLP treatment due to (comment)       [place], [place], [place] Speech Language Pathologist Office: [phone] Pager: [phone]   Shonna Chock 09/19/2019, 11:35 AM

## 2019-09-20 ENCOUNTER — Other Ambulatory Visit: Payer: Self-pay | Admitting: Radiation Therapy

## 2019-09-20 DIAGNOSIS — N189 Chronic kidney disease, unspecified: Secondary | ICD-10-CM

## 2019-09-20 DIAGNOSIS — N179 Acute kidney failure, unspecified: Secondary | ICD-10-CM

## 2019-09-20 LAB — BASIC METABOLIC PANEL
Anion gap: 12 (ref 5–15)
BUN: 44 mg/dL — ABNORMAL HIGH (ref 8–23)
CO2: 21 mmol/L — ABNORMAL LOW (ref 22–32)
Calcium: 9.4 mg/dL (ref 8.9–10.3)
Chloride: 105 mmol/L (ref 98–111)
Creatinine, Ser: 3.01 mg/dL — ABNORMAL HIGH (ref 0.44–1.00)
GFR calc Af Amer: 17 mL/min — ABNORMAL LOW (ref >60)
GFR calc non Af Amer: 14 mL/min — ABNORMAL LOW (ref >60)
Glucose, Bld: 77 mg/dL (ref 70–99)
Potassium: 4.6 mmol/L (ref 3.5–5.1)
Sodium: 138 mmol/L (ref 135–145)

## 2019-09-20 LAB — CBC
HCT: 28.9 % — ABNORMAL LOW (ref 36.0–46.0)
Hemoglobin: 9.1 g/dL — ABNORMAL LOW (ref 12.0–15.0)
MCH: 28.4 pg (ref 26.0–34.0)
MCHC: 31.5 g/dL (ref 30.0–36.0)
MCV: 90.3 fL (ref 80.0–100.0)
Platelets: 275 10*3/uL (ref 150–400)
RBC: 3.2 MIL/uL — ABNORMAL LOW (ref 3.87–5.11)
RDW: 17.1 % — ABNORMAL HIGH (ref 11.5–15.5)
WBC: 11.1 10*3/uL — ABNORMAL HIGH (ref 4.0–10.5)
nRBC: 0 % (ref 0.0–0.2)

## 2019-09-20 MED ORDER — BOOST / RESOURCE BREEZE PO LIQD CUSTOM
1.0000 | Freq: Two times a day (BID) | ORAL | Status: DC
  Administered 2019-09-20 – 2019-09-22 (×6): 1 via ORAL

## 2019-09-20 MED ORDER — SODIUM CHLORIDE 0.9 % IV SOLN
INTRAVENOUS | Status: AC
  Administered 2019-09-20: 19:00:00 via INTRAVENOUS

## 2019-09-20 NOTE — Progress Notes (Signed)
          Daily Rounding Note  09/20/2019, 8:58 AM  LOS: 7 days   SUBJECTIVE:   Chief complaint:   Dyphagia. Esophageal we For EGD with possible esoph dilatation tomorrow No complaints  OBJECTIVE:         Vital signs in last 24 hours:    Temp:  [98 F (36.7 C)-98.4 F (36.9 C)] 98 F (36.7 C) (11/19 0735) Pulse Rate:  [79-93] 79 (11/19 0735) Resp:  [16-20] 18 (11/19 0735) BP: (103-125)/(51-81) 103/52 (11/19 0735) SpO2:  [95 %-99 %] 99 % (11/19 0735) Last BM Date: 09/18/19 Filed Weights   09/12/19 1432  Weight: 69.5 kg   General: comfortable.  Looks well. Heart: RRR. Chest: Clear bilaterally no labored breathing.  No cough. Abdomen: Nontender, nondistended.  Active bowel sounds Extremities: Trace pedal/ankle edema. Neuro/Psych: Fully alert.  Fully oriented.  No gross deficits, involuntary movements or tremors.  Intake/Output from previous day: 11/18 0701 - 11/19 0700 In: 120 [P.O.:120] Out: 301 [Urine:301]  Intake/Output this shift: No intake/output data recorded.  Lab Results: Recent Labs    09/18/19 0409 09/20/19 0658  WBC 9.7 11.1*  HGB 9.3* 9.1*  HCT 29.4* 28.9*  PLT 265 275   BMET Recent Labs    09/18/19 0411 09/20/19 0658  NA 137 138  K 4.8 4.6  CL 105 105  CO2 21* 21*  GLUCOSE 88 77  BUN 34* 44*  CREATININE 2.61* 3.01*  CALCIUM 9.8 9.4   LFT Recent Labs    09/18/19 0411  ALBUMIN 2.1*   PT/INR No results for input(s): LABPROT, INR in the last 72 hours.  Studies/Results: No results found.  ASSESMENT:   *    Dysphagia.  Esophageal web, epiphrenic esophageal diverticulum.  *    Normocytic anemia.   not iron deficient     *   Chronic Eliquis.  Right arm DVT.  Last dose 11/17 in the morning.  *    Chronic constipation.  Colonic wall thickening observed on CT scan.  *     Widely metastatic breast cancer.  *    AKI, CKD.    PLAN   *    EGD w dilt 11/18 at 0800.  Npo  after midnight    Azucena Freed  09/20/2019, 8:58 AM Phone [phone]

## 2019-09-20 NOTE — Progress Notes (Signed)
PROGRESS NOTE   [name] [name]  FGH:[id number]    DOB: 1941/10/21    DOA: 09/12/2019  PCP: Cassandria Anger, MD   I have briefly reviewed patients previous medical records in [facility]  Chief Complaint  Patient presents with  . Code Stroke    Brief Narrative:  78 year old female with history of recurrent widely metastatic breast cancer on chemotherapy, with metastasis to lungs, bones and liver,, history of palliative radiation to the neck and subclavian area, chronic right arm weakness and stiffness, hypertension, dyslipidemia, osteoarthritis, history of DVT on Eliquis presented to the emergency room yesterday with sudden onset right face numbness, and some left leg numbness, Neurology seen and signed off 11/12 and didn't think consistent of CVA. Dysphagia being evaluated by [facility] with plans for EGD after Eliquis wash out, plan for 11/20 at 8 AM.   Assessment & Plan:   Active Problems:   Essential hypertension   Situational anxiety   Breast cancer metastasized to bone, right (HCC)   Chest pain   Facial weakness   Protein-calorie malnutrition, severe   Transient right facial weakness, left leg weakness - facial weaknes and LLE weakness have resolved - MRI negative for acute ischemic events or metastasis - SLP evaluation: Dys 2 diet w/ thin liquids - TTE: EF 60 to 65% - Apixaban currently on hold for EGD. - PT/OT rec SNF; family agreeable, working on options. SNF when medically ready.  Bilateral pleural effusion, dyspnea on exertion - Could be malignant effusions, has widely metastatic pulmonary disease - echo EF 60-65%, trivial pericardial effusion;comfortableon RA - stable small-to-moderated R pleural effusion; left pleural effusion is small; no PNA seen on CT, but stable b/l nodules are noted  Recurrent widely metastatic breast cancer - Prognosis is poor, anticipate she will need a goals of care discussion in the  near future, followed by Dr. Lindi Adie, at this time insists on full code and full scope of treatment - Receiving palliative chemotherapy with Keytruda infusions every 3 weeks, last treatment was 11/3  Stage 3-4 chronic kidney disease - Baseline creatinine around 2-2.4 -Creatinine has gone up again from 2.61-3.01.  May be related to poor oral intake.  Brief IV fluids and follow BMP in a.m.  Chronic right arm DVT  - chronic right arm weakness and stiffness, -continueEliquis> currently held for EGD   Essential Hypertension - Controlled, ARB on hold due to acute kidney injury  Dysphagia - [facility] follow up appreciated. Last dose of Eliquis: 11/17. Plan for EGD 11/20  Body mass index is 27.14 kg/m.  Nutritional Status Nutrition Problem: Severe Malnutrition Etiology: chronic illness(metastatic breast cancer) Signs/Symptoms: energy intake < 75% for > or equal to 1 month, percent weight loss Percent weight loss: 13.8 % Interventions: MVI, Boost Breeze, Magic cup, Ensure Enlive (each supplement provides 350kcal and 20 grams of protein)  DVT prophylaxis: SCD's Code Status: Full Family Communication: None at bedside Disposition: DC to SNF when medically ready.   Consultants:  Neu[facility]   Procedures:  None  Antimicrobials:  None    Subjective: Feels so-so this morning.  Denies complaints.  Wants to know when her procedure is for tomorrow.  RN in room.  No acute issues reported.  Objective:  Vitals:   09/20/19 0414 09/20/19 0735 09/20/19 1235 09/20/19 1537  BP: (!) 104/54 (!) 103/52 113/60 104/62  Pulse: 84 79 85 85  Resp: 17 18 18 18   Temp: 98 F (36.7 C) 98 F (36.7 C) 98.1 F (  36.7 C) 98.6 F (37 C)  TempSrc: Oral Oral Oral Oral  SpO2: 97% 99% 100% 97%  Weight:        Examination:  General exam: Elderly female lying comfortably in bed. Respiratory system: Clear to auscultation.  No increased work of breathing.  Cardiovascular system: S1 & S2 heard, RRR. No JVD, murmurs, rubs, gallops or clicks. Trace ankle edema.  Telemetry personally reviewed: Sinus rhythm. Gastrointestinal system: Abdomen is nondistended, soft and nontender. No organomegaly or masses felt. Normal bowel sounds heard. Central nervous system: Alert and oriented x2. No focal neurological deficits. Extremities: Symmetric 5 x 5 power. Skin: No rashes, lesions or ulcers Psychiatry: Judgement and insight appear normal. Mood & affect appropriate.     Data Reviewed: I have personally reviewed following labs and imaging studies   CBC: Recent Labs  Lab 09/15/19 1012 09/16/19 0700 09/18/19 0409 09/20/19 0658  WBC 8.8 7.7 9.7 11.1*  NEUTROABS 6.0 5.9 7.1  --   HGB 10.2* 9.4* 9.3* 9.1*  HCT 32.1* 29.4* 29.4* 28.9*  MCV 89.7 90.5 89.1 90.3  PLT 280 264 265 161    Basic Metabolic Panel: Recent Labs  Lab 09/14/19 1300 09/15/19 1012 09/16/19 0700 09/17/19 0749 09/18/19 0409 09/18/19 0411 09/20/19 0658  NA  --  134* 134* 136  --  137 138  K  --  4.3 4.6 4.4  --  4.8 4.6  CL  --  103 103 106  --  105 105  CO2  --  22 21* 21*  --  21* 21*  GLUCOSE  --  118* 89 85  --  88 77  BUN  --  28* 34* 33*  --  34* 44*  CREATININE  --  2.95* 3.23* 2.69*  --  2.61* 3.01*  CALCIUM  --  9.0 9.1 9.2  --  9.8 9.4  MG 2.6*  --  2.9*  --  2.8*  --   --   PHOS  --  3.7 4.0 3.5  --  3.6  --     Liver Function Tests: Recent Labs  Lab 09/15/19 1012 09/16/19 0700 09/17/19 0749 09/18/19 0411  ALBUMIN 2.6* 2.3* 2.2* 2.1*    CBG: No results for input(s): GLUCAP in the last 168 hours.  Recent Results (from the past 240 hour(s))  SARS CORONAVIRUS 2 (TAT 6-24 HRS) Nasopharyngeal Nasopharyngeal Swab     Status: None   Collection Time: 09/12/19  6:52 PM   Specimen: Nasopharyngeal Swab  Result Value Ref Range Status   SARS Coronavirus 2 NEGATIVE NEGATIVE Final    Comment: (NOTE) SARS-CoV-2 target nucleic acids are NOT DETECTED. The  SARS-CoV-2 RNA is generally detectable in upper and lower respiratory specimens during the acute phase of infection. Negative results do not preclude SARS-CoV-2 infection, do not rule out co-infections with other pathogens, and should not be used as the sole basis for treatment or other patient management decisions. Negative results must be combined with clinical observations, patient history, and epidemiological information. The expected result is Negative. Fact Sheet for Patients: SugarRoll.be Fact Sheet for Healthcare Providers: https://www.woods-mathews.com/ This test is not yet approved or cleared by the [place] FDA and  has been authorized for detection and/or diagnosis of SARS-CoV-2 by FDA under an Emergency Use Authorization (EUA). This EUA will remain  in effect (meaning this test can be used) for the duration of the COVID-19 declaration under Section 56 4(b)(1) of the Act, 21 U.S.C. section 360bbb-3(b)(1), unless the authorization is terminated or revoked  sooner. Performed at [facility], [place] [address] [place], [place] [address]   SARS CORONAVIRUS 2 (TAT 6-24 HRS) Nasopharyngeal Nasopharyngeal Swab     Status: None   Collection Time: 09/17/19  4:37 PM   Specimen: Nasopharyngeal Swab  Result Value Ref Range Status   SARS Coronavirus 2 NEGATIVE NEGATIVE Final    Comment: (NOTE) SARS-CoV-2 target nucleic acids are NOT DETECTED. The SARS-CoV-2 RNA is generally detectable in upper and lower respiratory specimens during the acute phase of infection. Negative results do not preclude SARS-CoV-2 infection, do not rule out co-infections with other pathogens, and should not be used as the sole basis for treatment or other patient management decisions. Negative results must be combined with clinical observations, patient history, and epidemiological information. The expected result is Negative. Fact Sheet for Patients:  SugarRoll.be Fact Sheet for Healthcare Providers: https://www.woods-mathews.com/ This test is not yet approved or cleared by the [place] FDA and  has been authorized for detection and/or diagnosis of SARS-CoV-2 by FDA under an Emergency Use Authorization (EUA). This EUA will remain  in effect (meaning this test can be used) for the duration of the COVID-19 declaration under Section 56 4(b)(1) of the Act, 21 U.S.C. section 360bbb-3(b)(1), unless the authorization is terminated or revoked sooner. Performed at [facility], [place] [address], [place], [place] [address]       Radiology Studies: No results found.        Scheduled Meds: . amLODipine  10 mg Oral Daily  . bisacodyl  20 mg Oral Daily  . Chlorhexidine Gluconate Cloth  6 each Topical Daily  . docusate  100 mg Oral BID  . dronabinol  2.5 mg Oral BID AC  . feeding supplement  1 Container Oral BID BM  . feeding supplement (ENSURE ENLIVE)  237 mL Oral TID BM  . gabapentin  100 mg Oral QHS  . mirtazapine  7.5 mg Oral QHS  . multivitamin with minerals  1 tablet Oral Daily  . pioglitazone  15 mg Oral Daily  . polyethylene glycol  34 g Oral Daily  . sodium chloride flush  3 mL Intravenous Once  . traMADol  50 mg Oral BID   Continuous Infusions:   LOS: 7 days     Vernell Leep, MD, [place], [facility]. [facility]  To contact the attending provider between 7A-7P or the covering provider during after hours 7P-7A, please log into the web site [email or web address] and access using universal [place] password for that web site. If you do not have the password, please call the hospital operator.  09/20/2019, 6:29 PM

## 2019-09-20 NOTE — Progress Notes (Signed)
PT Cancellation Note  Patient Details Name: [name] [name] MRN: [id number] DOB: Oct 03, 1941   Cancelled Treatment:    Reason Eval/Treat Not Completed: Fatigue/lethargy limiting ability to participate;Other (comment).  Upset about a friend who called with bad news, and is feeling poorly today already with poor intake per pt.  Follow up with her and encourage mild activity as tolerated.   Ramond Dial 09/20/2019, 1:35 PM   [name], PT MS Acute Rehab Dept. Number: [place] and [place]

## 2019-09-20 NOTE — H&P (View-Only) (Signed)
          Daily Rounding Note  09/20/2019, 8:58 AM  LOS: 7 days   SUBJECTIVE:   Chief complaint:   Dyphagia. Esophageal we For EGD with possible esoph dilatation tomorrow No complaints  OBJECTIVE:         Vital signs in last 24 hours:    Temp:  [98 F (36.7 C)-98.4 F (36.9 C)] 98 F (36.7 C) (11/19 0735) Pulse Rate:  [79-93] 79 (11/19 0735) Resp:  [16-20] 18 (11/19 0735) BP: (103-125)/(51-81) 103/52 (11/19 0735) SpO2:  [95 %-99 %] 99 % (11/19 0735) Last BM Date: 09/18/19 Filed Weights   09/12/19 1432  Weight: 69.5 kg   General: comfortable.  Looks well. Heart: RRR. Chest: Clear bilaterally no labored breathing.  No cough. Abdomen: Nontender, nondistended.  Active bowel sounds Extremities: Trace pedal/ankle edema. Neuro/Psych: Fully alert.  Fully oriented.  No gross deficits, involuntary movements or tremors.  Intake/Output from previous day: 11/18 0701 - 11/19 0700 In: 120 [P.O.:120] Out: 301 [Urine:301]  Intake/Output this shift: No intake/output data recorded.  Lab Results: Recent Labs    09/18/19 0409 09/20/19 0658  WBC 9.7 11.1*  HGB 9.3* 9.1*  HCT 29.4* 28.9*  PLT 265 275   BMET Recent Labs    09/18/19 0411 09/20/19 0658  NA 137 138  K 4.8 4.6  CL 105 105  CO2 21* 21*  GLUCOSE 88 77  BUN 34* 44*  CREATININE 2.61* 3.01*  CALCIUM 9.8 9.4   LFT Recent Labs    09/18/19 0411  ALBUMIN 2.1*   PT/INR No results for input(s): LABPROT, INR in the last 72 hours.  Studies/Results: No results found.  ASSESMENT:   *    Dysphagia.  Esophageal web, epiphrenic esophageal diverticulum.  *    Normocytic anemia.   not iron deficient     *   Chronic Eliquis.  Right arm DVT.  Last dose 11/17 in the morning.  *    Chronic constipation.  Colonic wall thickening observed on CT scan.  *     Widely metastatic breast cancer.  *    AKI, CKD.    PLAN   *    EGD w dilt 11/18 at 0800.  Npo  after midnight    Azucena Freed  09/20/2019, 8:58 AM Phone [phone]

## 2019-09-20 NOTE — Plan of Care (Signed)
  Problem: Self-Care: Goal: Ability to participate in self-care as condition permits will improve Outcome: Progressing Goal: Ability to communicate needs accurately will improve Outcome: Progressing   Problem: Nutrition: Goal: Dietary intake will improve Outcome: Progressing

## 2019-09-20 NOTE — Progress Notes (Signed)
Nutrition Follow-up  DOCUMENTATION CODES:   Severe malnutrition in context of chronic illness  INTERVENTION:  - continue Ensure Enlive TID, each supplement provides 350 kcal and 20 grams of protein. - will order Boost Breeze BID, each supplement provides 250 kcal and 9 grams of protein. - will order Magic Cup with dinner meals, each supplement provides 290 kcal and 9 grams of protein. - continue to encourage PO intakes. - weigh patient today.  - start Calorie Count for 11/20-11/22; if intakes are inadequate, highly recommend Cortrak placement on 11/24.    NUTRITION DIAGNOSIS:   Severe Malnutrition related to chronic illness(metastatic breast cancer) as evidenced by energy intake < 75% for > or equal to 1 month, percent weight loss. -ongoing  GOAL:   Patient will meet greater than or equal to 90% of their needs -unmet  MONITOR:   PO intake, Supplement acceptance, Labs, Weight trends  ASSESSMENT:   78 y.o. female with medical history of cancer in 2002 treated successfully. She now has a recurrence with widely metastatic breast cancer involving, liver, lungs, and bone. She is following by Oncology and was started on and failed several therapies; currently on Keytruda IV every 3 weeks. She is s/p palliative radiation therapy to neck and subclavian area. She had DVT of RUE which has led to limited range of motion and function. She had several weeks of R-sided weakness PTA and began experiencing acute R-sided face weakness at the time of admission. She also reported difficulty with swallowing.  Flow sheet documentation indicates that patient most recently had the following meal completion: 11/13- 50% of breakfast 11/16- 0% of breakfast 11/17- 0% of all meals 11/18- 0% of breakfast   She has been accepting Ensure ~75% of the time offered but it is unclear how much of these supplements she has actually consumed.  Able to briefly talk with RN who reported that patient refused breakfast  because it was cold. Breakfast tray still at bedside and was still warm at the time of RD visit.  Patient to have EGD with possible dilation tomorrow. Patient reports that her son is coming to be with her after the procedure. She states that after this she will be able to eat well and that she does not have any concerns about meeting nutrition needs after procedure and visit with her son.  Patient seems to be fairly picky about foods and textures. She states that "strange" feeling items cause her to vomit. She is unable to give examples of this other than small medication pills. She is unable to give specific examples of foods she does not like/will not eat, but when looking at the hospital menu stated that she would not eat over half of what was available and the items that she would be willing to consume would need to be in very tiny pieces (this included a prolonged fixation on salad with toppings). Patient also reported that if items are on the tray that she does not like then she is unable to eat anything on the tray.   Talked with patient about a Cortrak tube. She states that she would prefer to try to eat first. Expressed understanding but concern that she is not getting adequate nutrition.    Labs reviewed; BUN: 44 mg/dl, creatinine: 3.01 mg/dl, GFR: 17 ml/min. Medications reviewed; 100 mg colace BID, 2.5 mg marinol BID, daily multivitamin with minerals, 34 g miralax/day.     NUTRITION - FOCUSED PHYSICAL EXAM:    Most Recent Value  Orbital Region  Mild depletion  Upper Arm Region  Moderate depletion  Thoracic and Lumbar Region  Unable to assess  Buccal Region  Mild depletion  Temple Region  Mild depletion  Clavicle Bone Region  Moderate depletion  Clavicle and Acromion Bone Region  Mild depletion  Scapular Bone Region  Unable to assess  Dorsal Hand  Mild depletion  Patellar Region  Unable to assess  Anterior Thigh Region  Unable to assess  Posterior Calf Region  Unable to assess   Edema (RD Assessment)  Unable to assess  Hair  Reviewed  Eyes  Reviewed  Mouth  Reviewed  Skin  Reviewed  Nails  Reviewed       Diet Order:   Diet Order            Diet NPO time specified  Diet effective midnight        DIET DYS 2 Room service appropriate? Yes; Fluid consistency: Thin  Diet effective now              EDUCATION NEEDS:   Education needs have been addressed  Skin:  Skin Assessment: Reviewed RN Assessment  Last BM:  11/17  Height:   Ht Readings from Last 1 Encounters:  09/04/19 5\' 3"  (1.6 m)    Weight:   Wt Readings from Last 1 Encounters:  09/12/19 69.5 kg    Ideal Body Weight:  52.3 kg  BMI:  Body mass index is 27.14 kg/m.  Estimated Nutritional Needs:   Kcal:  1850-2050  Protein:  90-105 grams  Fluid:  > 1.8 L     Jarome Matin, MS, RD, LDN, [facility] Inpatient Clinical Dietitian Pager # [phone] After hours/weekend pager # [phone]

## 2019-09-21 ENCOUNTER — Inpatient Hospital Stay (HOSPITAL_COMMUNITY): Payer: Medicare HMO | Admitting: Certified Registered"

## 2019-09-21 ENCOUNTER — Encounter (HOSPITAL_COMMUNITY): Payer: Self-pay | Admitting: *Deleted

## 2019-09-21 ENCOUNTER — Encounter (HOSPITAL_COMMUNITY)
Admission: EM | Disposition: A | Discharge status: Home Health | Payer: Self-pay | Source: Home / Self Care | Attending: Internal Medicine

## 2019-09-21 ENCOUNTER — Inpatient Hospital Stay (HOSPITAL_COMMUNITY): Payer: Medicare HMO

## 2019-09-21 DIAGNOSIS — K222 Esophageal obstruction: Secondary | ICD-10-CM

## 2019-09-21 HISTORY — PX: ESOPHAGEAL BRUSHING: SHX6842

## 2019-09-21 HISTORY — PX: ESOPHAGOGASTRODUODENOSCOPY (EGD) WITH PROPOFOL: SHX5813

## 2019-09-21 HISTORY — PX: SAVORY DILATION: SHX5439

## 2019-09-21 LAB — BASIC METABOLIC PANEL
Anion gap: 11 (ref 5–15)
BUN: 43 mg/dL — ABNORMAL HIGH (ref 8–23)
CO2: 21 mmol/L — ABNORMAL LOW (ref 22–32)
Calcium: 9.1 mg/dL (ref 8.9–10.3)
Chloride: 107 mmol/L (ref 98–111)
Creatinine, Ser: 2.76 mg/dL — ABNORMAL HIGH (ref 0.44–1.00)
GFR calc Af Amer: 19 mL/min — ABNORMAL LOW (ref >60)
GFR calc non Af Amer: 16 mL/min — ABNORMAL LOW (ref >60)
Glucose, Bld: 85 mg/dL (ref 70–99)
Potassium: 4.2 mmol/L (ref 3.5–5.1)
Sodium: 139 mmol/L (ref 135–145)

## 2019-09-21 SURGERY — ESOPHAGOGASTRODUODENOSCOPY (EGD) WITH PROPOFOL
Anesthesia: Monitor Anesthesia Care

## 2019-09-21 MED ORDER — ONDANSETRON HCL 4 MG/2ML IJ SOLN
INTRAMUSCULAR | Status: DC | PRN
  Administered 2019-09-21: 4 mg via INTRAVENOUS

## 2019-09-21 MED ORDER — SODIUM CHLORIDE 0.9 % IV SOLN
INTRAVENOUS | Status: DC
  Administered 2019-09-21: 08:00:00 via INTRAVENOUS

## 2019-09-21 MED ORDER — PROPOFOL 10 MG/ML IV BOLUS
INTRAVENOUS | Status: DC | PRN
  Administered 2019-09-21: 30 mg via INTRAVENOUS
  Administered 2019-09-21 (×5): 20 mg via INTRAVENOUS
  Administered 2019-09-21: 30 mg via INTRAVENOUS
  Administered 2019-09-21: 70 mg via INTRAVENOUS
  Administered 2019-09-21: 20 mg via INTRAVENOUS

## 2019-09-21 MED ORDER — FENTANYL CITRATE (PF) 100 MCG/2ML IJ SOLN
INTRAMUSCULAR | Status: DC | PRN
  Administered 2019-09-21 (×2): 25 ug via INTRAVENOUS

## 2019-09-21 MED ORDER — PHENYLEPHRINE 40 MCG/ML (10ML) SYRINGE FOR IV PUSH (FOR BLOOD PRESSURE SUPPORT)
PREFILLED_SYRINGE | INTRAVENOUS | Status: DC | PRN
  Administered 2019-09-21: 80 ug via INTRAVENOUS
  Administered 2019-09-21: 40 ug via INTRAVENOUS

## 2019-09-21 MED ORDER — GLYCOPYRROLATE PF 0.2 MG/ML IJ SOSY
PREFILLED_SYRINGE | INTRAMUSCULAR | Status: DC | PRN
  Administered 2019-09-21: .1 mg via INTRAVENOUS

## 2019-09-21 MED ORDER — LIDOCAINE 2% (20 MG/ML) 5 ML SYRINGE
INTRAMUSCULAR | Status: DC | PRN
  Administered 2019-09-21: 50 mg via INTRAVENOUS

## 2019-09-21 MED ORDER — SODIUM CHLORIDE 0.9 % IV SOLN
INTRAVENOUS | Status: DC | PRN
  Administered 2019-09-21 (×2): via INTRAVENOUS

## 2019-09-21 MED ORDER — SODIUM CHLORIDE 0.9 % IV SOLN
INTRAVENOUS | Status: AC
  Administered 2019-09-21: 10:00:00 via INTRAVENOUS

## 2019-09-21 SURGICAL SUPPLY — 15 items

## 2019-09-21 NOTE — Interval H&P Note (Signed)
History and Physical Interval Note:  09/21/2019 8:04 AM  [name] [name]  has presented today for surgery, with the diagnosis of dysphagia.  The various methods of treatment have been discussed with the patient and family. After consideration of risks, benefits and other options for treatment, the patient has consented to  Procedure(s): ESOPHAGOGASTRODUODENOSCOPY (EGD) WITH PROPOFOL (N/A) BALLOON DILATION (N/A) as a surgical intervention.  The patient's history has been reviewed, patient examined, no change in status, stable for surgery.  I have reviewed the patient's chart and labs.  Questions were answered to the patient's satisfaction.     Nelida Meuse III

## 2019-09-21 NOTE — Care Management Important Message (Signed)
Important Message  Patient Details  Name: [name] [name] MRN: [id number] Date of Birth: 1941/10/07   Medicare Important Message Given:  Yes     Nayvie Lips 09/21/2019, 2:00 PM

## 2019-09-21 NOTE — Progress Notes (Signed)
Physical Therapy Treatment Patient Details Name: [name] [name] MRN: [id number] DOB: [birth year] Today's Date: 09/21/2019    History of Present Illness [name] [name] is a 78 y.o. female with medical history significant of cancer in 2002 treated successfully.  Currently with a recurrence with widely metastatic disease involving liver lungs bone.  She is still undergoing treatment for her cancer and had a DVT in the R UE.  She presented with R facial droop and weakness over couple of weeks and now with some speech and swallowing issues.    PT Comments    Pt performed gt training but remains limited and easily fatigues with activity.  She is declining SNF and will require HHPT and a hospital bed.  Son present and gt belt issued for home use.     Follow Up Recommendations  SNF;Supervision for mobility/OOB(Pt refusing SNF placement and wants to go home, she will require HHPT and 24 hr assistance at home.)     Equipment Recommendations  (hospital bed.)    Recommendations for Other Services       Precautions / Restrictions Precautions Precautions: Fall Restrictions Weight Bearing Restrictions: No    Mobility  Bed Mobility Overal bed mobility: Needs Assistance Bed Mobility: Supine to Sit;Sit to Supine     Supine to sit: Min assist Sit to supine: Mod assist   General bed mobility comments: Min assistance to advance LEs to edge of bed and to elevate trunk into sitting.  Pt required increased assistance to return back to bed against gravity.  Transfers Overall transfer level: Needs assistance Equipment used: Rolling walker (2 wheeled) Transfers: Sit to/from Stand Sit to Stand: Min assist         General transfer comment: From raised height, pt can transfer with min assist.  Mod assist from low service.    Ambulation/Gait Ambulation/Gait assistance: Min assist Gait Distance (Feet): 16 Feet Assistive device: Rolling walker (2 wheeled) Gait Pattern/deviations: Step-to  pattern;Trunk flexed;Decreased stride length Gait velocity: decreased   General Gait Details: Cues for upper trunk control and to stay close to RW.  Pt continues to present with urinary incontinence in standing.   Pt required assistance to turn and for backing.  Min assistance due to LOB.   Stairs             Wheelchair Mobility    Modified Rankin (Stroke Patients Only)       Balance Overall balance assessment: Needs assistance Sitting-balance support: Feet supported Sitting balance-Leahy Scale: Good     Standing balance support: Bilateral upper extremity supported;During functional activity Standing balance-Leahy Scale: Fair Standing balance comment: UE support for balance                            Cognition Arousal/Alertness: Awake/alert Behavior During Therapy: WFL for tasks assessed/performed Overall Cognitive Status: Within Functional Limits for tasks assessed Area of Impairment: Memory                     Memory: Decreased short-term memory         General Comments: Not formally assessed but able to function within tasks assessed.      Exercises      General Comments        Pertinent Vitals/Pain Pain Assessment: Faces Pain Score: 4  Pain Location: back and L hand Pain Descriptors / Indicators: Sore;Aching Pain Intervention(s): Monitored during session;Repositioned    Home Living  Prior Function            PT Goals (current goals can now be found in the care plan section) Acute Rehab PT Goals Patient Stated Goal: To go home Potential to Achieve Goals: Good Progress towards PT goals: Progressing toward goals    Frequency    Min 4X/week      PT Plan Current plan remains appropriate    Co-evaluation              AM-PAC PT "6 Clicks" Mobility   Outcome Measure  Help needed turning from your back to your side while in a flat bed without using bedrails?: A Little Help needed  moving from lying on your back to sitting on the side of a flat bed without using bedrails?: A Little Help needed moving to and from a bed to a chair (including a wheelchair)?: A Little Help needed standing up from a chair using your arms (e.g., wheelchair or bedside chair)?: A Little Help needed to walk in hospital room?: A Little Help needed climbing 3-5 steps with a railing? : A Little 6 Click Score: 18    End of Session Equipment Utilized During Treatment: Gait belt Activity Tolerance: Patient limited by fatigue Patient left: in bed;with bed alarm set;with call bell/phone within reach Nurse Communication: Mobility status PT Visit Diagnosis: Unsteadiness on feet (R26.81);Muscle weakness (generalized) (M62.81);Difficulty in walking, not elsewhere classified (R26.2)     Time: 9021-1155 PT Time Calculation (min) (ACUTE ONLY): 22 min  Charges:  $Gait Training: 8-22 mins                     [name] [name] , PTA Acute Rehabilitation Services Pager [phone] Office [phone]     [name] [name] 09/21/2019, 4:50 PM

## 2019-09-21 NOTE — Anesthesia Postprocedure Evaluation (Signed)
Anesthesia Post Note  Patient: [name] [name]  Procedure(s) Performed: ESOPHAGOGASTRODUODENOSCOPY (EGD) WITH PROPOFOL (N/A ) ESOPHAGEAL BRUSHING SAVORY DILATION (N/A )     Patient location during evaluation: PACU Anesthesia Type: MAC Level of consciousness: awake and alert Pain management: pain level controlled Vital Signs Assessment: post-procedure vital signs reviewed and stable Respiratory status: spontaneous breathing, nonlabored ventilation, respiratory function stable and patient connected to nasal cannula oxygen Cardiovascular status: stable and blood pressure returned to baseline Postop Assessment: no apparent nausea or vomiting Anesthetic complications: no    Last Vitals:  Vitals:   09/21/19 0915 09/21/19 0927  BP: (!) 120/47 (!) 134/57  Pulse: 92   Resp: 14 (!) 25  Temp:    SpO2: 99%     Last Pain:  Vitals:   09/21/19 0915  TempSrc:   PainSc: 0-No pain                 Lesly Pontarelli

## 2019-09-21 NOTE — Progress Notes (Signed)
PROGRESS NOTE   [name] [name]  CXK:[id number]    DOB: 05-25-1941    DOA: 09/12/2019  PCP: Cassandria Anger, MD   I have briefly reviewed patients previous medical records in [facility].  Chief Complaint  Patient presents with  . Code Stroke    Brief Narrative:  78 year old female with history of recurrent widely metastatic breast cancer on chemotherapy, with metastasis to lungs, bones and liver,, history of palliative radiation to the neck and subclavian area, chronic right arm weakness and stiffness, hypertension, dyslipidemia, osteoarthritis, history of DVT on Eliquis presented to the emergency room yesterday with sudden onset right face numbness, and some left leg numbness, Neurology seen and signed off 11/12 and didn't think consistent of CVA. Dysphagia evaluated by [facility], s/p EGD with dilatation on 11/20   Assessment & Plan:   Active Problems:   Essential hypertension   Situational anxiety   Breast cancer metastasized to bone, right (HCC)   Chest pain   Facial weakness   Protein-calorie malnutrition, severe   Esophageal stricture   Transient right facial weakness, left leg weakness - facial weaknes and LLE weakness have resolved - MRI negative for acute ischemic events or metastasis - SLP evaluation: Dys 2 diet w/ thin liquids - TTE: EF 60 to 65% - Apixaban currently on hold for EGD. - PT/OT rec SNF but patient and son today have decided to go home tomorrow. CM assisting with therapies and DME/Hospital bed.  Bilateral pleural effusion, dyspnea on exertion - Could be malignant effusions, has widely metastatic pulmonary disease - echo EF 60-65%, trivial pericardial effusion;comfortableon RA - stable small-to-moderated R pleural effusion; left pleural effusion is small; no PNA seen on CT, but stable b/l nodules are noted  Recurrent widely metastatic breast cancer - Prognosis is poor, anticipate she will need a  goals of care discussion in the near future, followed by Dr. Lindi Adie, at this time insists on full code and full scope of treatment - Receiving palliative chemotherapy with Keytruda infusions every 3 weeks, last treatment was 11/3  Stage 3-4 chronic kidney disease - Baseline creatinine around 2-2.4 -Creatinine has gone up again from 2.61-3.01.  May be related to poor oral intake.  Brief IV fluids, improved (creat: 2.76) , continue gentle IVF for another 24 hours and follow BMP in a.m.  Chronic right arm DVT  - chronic right arm weakness and stiffness, -continueEliquis> held for EGD, as per GI okay to resume from 11/21.  Essential Hypertension - Controlled, ARB on hold due to acute kidney injury  Dysphagia - [facility] follow up appreciated. Last dose of Eliquis: 11/17.  S/PE EGD and dilatation of benign-appearing esophageal stenosis on 11/20.  Please see detailed report and recommendations as below.  Recommend clear liquid diet and advance to soft diet indefinitely and okay to resume Eliquis from tomorrow.  Body mass index is 27.14 kg/m.  Nutritional Status Nutrition Problem: Severe Malnutrition Etiology: chronic illness(metastatic breast cancer) Signs/Symptoms: energy intake < 75% for > or equal to 1 month, percent weight loss Percent weight loss: 13.8 % Interventions: MVI, Boost Breeze, Magic cup, Ensure Enlive (each supplement provides 350kcal and 20 grams of protein)  DVT prophylaxis: SCD's Code Status: Full Family Communication: None at bedside Disposition: DC to SNF when medically ready.   Consultants:  Neurology [facility]   Procedures:  EGD 09/21/2019:  Impression: - Benign-appearing esophageal stenosis. Dilated. The entire esophagus was narrow. - Esophageal plaques were found, suspicious for candidiasis. Brushings performed. - Normal  stomach. - Normal examined duodenum. The entire esophagus is of a narrow caliber, with more prominent  proximal narrowing possibly from extrinsic disease or prior radiation. Distal stricture was more severe.  Recommendations:  - Return patient to referring hospital for ongoing care. - Clear liquid diet for 2 hours, then soft diet indefinitely. All meds should be crushed if possible and taken with applesauce or yogurt. - Continue present medications. - Await pathology results. - Resume Eliquis at usual dose tomorrow.  Antimicrobials:  None    Subjective: Patient seen postprocedure.  Complains of throat discomfort.  No other complaints reported.  Objective:  Vitals:   09/21/19 0915 09/21/19 0927 09/21/19 1028 09/21/19 1225  BP: (!) 120/47 (!) 134/57 136/64 (!) 109/55  Pulse: 92  96 87  Resp: 14 (!) 25 20   Temp:   97.7 F (36.5 C) 98.8 F (37.1 C)  TempSrc:   Oral Oral  SpO2: 99%  98% 99%  Weight:      Height:        Examination:  General exam: Elderly female lying comfortably in bed.  Does not appear in any distress. Respiratory system: Clear to auscultation.  No increased work of breathing. Cardiovascular system: S1 & S2 heard, RRR. No JVD, murmurs, rubs, gallops or clicks.  Trace ankle edema. Gastrointestinal system: Abdomen is nondistended, soft and nontender. No organomegaly or masses felt. Normal bowel sounds heard. Central nervous system: Alert and oriented x2. No focal neurological deficits. Extremities: Symmetric 5 x 5 power. Skin: No rashes, lesions or ulcers Psychiatry: Judgement and insight appear normal. Mood & affect appropriate.     Data Reviewed: I have personally reviewed following labs and imaging studies   CBC: Recent Labs  Lab 09/15/19 1012 09/16/19 0700 09/18/19 0409 09/20/19 0658  WBC 8.8 7.7 9.7 11.1*  NEUTROABS 6.0 5.9 7.1  --   HGB 10.2* 9.4* 9.3* 9.1*  HCT 32.1* 29.4* 29.4* 28.9*  MCV 89.7 90.5 89.1 90.3  PLT 280 264 265 761    Basic Metabolic Panel: Recent Labs  Lab 09/15/19 1012 09/16/19 0700 09/17/19 0749 09/18/19 0409  09/18/19 0411 09/20/19 0658 09/21/19 0500  NA 134* 134* 136  --  137 138 139  K 4.3 4.6 4.4  --  4.8 4.6 4.2  CL 103 103 106  --  105 105 107  CO2 22 21* 21*  --  21* 21* 21*  GLUCOSE 118* 89 85  --  88 77 85  BUN 28* 34* 33*  --  34* 44* 43*  CREATININE 2.95* 3.23* 2.69*  --  2.61* 3.01* 2.76*  CALCIUM 9.0 9.1 9.2  --  9.8 9.4 9.1  MG  --  2.9*  --  2.8*  --   --   --   PHOS 3.7 4.0 3.5  --  3.6  --   --     Liver Function Tests: Recent Labs  Lab 09/15/19 1012 09/16/19 0700 09/17/19 0749 09/18/19 0411  ALBUMIN 2.6* 2.3* 2.2* 2.1*    CBG: No results for input(s): GLUCAP in the last 168 hours.  Recent Results (from the past 240 hour(s))  SARS CORONAVIRUS 2 (TAT 6-24 HRS) Nasopharyngeal Nasopharyngeal Swab     Status: None   Collection Time: 09/12/19  6:52 PM   Specimen: Nasopharyngeal Swab  Result Value Ref Range Status   SARS Coronavirus 2 NEGATIVE NEGATIVE Final    Comment: (NOTE) SARS-CoV-2 target nucleic acids are NOT DETECTED. The SARS-CoV-2 RNA is generally detectable in upper and lower  respiratory specimens during the acute phase of infection. Negative results do not preclude SARS-CoV-2 infection, do not rule out co-infections with other pathogens, and should not be used as the sole basis for treatment or other patient management decisions. Negative results must be combined with clinical observations, patient history, and epidemiological information. The expected result is Negative. Fact Sheet for Patients: SugarRoll.be Fact Sheet for Healthcare Providers: https://www.woods-mathews.com/ This test is not yet approved or cleared by the [place] FDA and  has been authorized for detection and/or diagnosis of SARS-CoV-2 by FDA under an Emergency Use Authorization (EUA). This EUA will remain  in effect (meaning this test can be used) for the duration of the COVID-19 declaration under Section 56 4(b)(1) of the Act, 21  U.S.C. section 360bbb-3(b)(1), unless the authorization is terminated or revoked sooner. Performed at [facility], [place] [address], [place], [place] [address]   SARS CORONAVIRUS 2 (TAT 6-24 HRS) Nasopharyngeal Nasopharyngeal Swab     Status: None   Collection Time: 09/17/19  4:37 PM   Specimen: Nasopharyngeal Swab  Result Value Ref Range Status   SARS Coronavirus 2 NEGATIVE NEGATIVE Final    Comment: (NOTE) SARS-CoV-2 target nucleic acids are NOT DETECTED. The SARS-CoV-2 RNA is generally detectable in upper and lower respiratory specimens during the acute phase of infection. Negative results do not preclude SARS-CoV-2 infection, do not rule out co-infections with other pathogens, and should not be used as the sole basis for treatment or other patient management decisions. Negative results must be combined with clinical observations, patient history, and epidemiological information. The expected result is Negative. Fact Sheet for Patients: SugarRoll.be Fact Sheet for Healthcare Providers: https://www.woods-mathews.com/ This test is not yet approved or cleared by the [place] FDA and  has been authorized for detection and/or diagnosis of SARS-CoV-2 by FDA under an Emergency Use Authorization (EUA). This EUA will remain  in effect (meaning this test can be used) for the duration of the COVID-19 declaration under Section 56 4(b)(1) of the Act, 21 U.S.C. section 360bbb-3(b)(1), unless the authorization is terminated or revoked sooner. Performed at [facility], [place] [address] [place], [place] [address]       Radiology Studies: Dg Esophagus Dilation  Result Date: 09/21/2019 ESOPHAGEAL DILATATION: Fluoroscopy was provided for use by the requesting physician.  No images were obtained for radiographic interpretation.         Scheduled Meds: . amLODipine  10 mg Oral Daily  . bisacodyl  20 mg Oral Daily  .  Chlorhexidine Gluconate Cloth  6 each Topical Daily  . docusate  100 mg Oral BID  . dronabinol  2.5 mg Oral BID AC  . feeding supplement  1 Container Oral BID BM  . feeding supplement (ENSURE ENLIVE)  237 mL Oral TID BM  . gabapentin  100 mg Oral QHS  . mirtazapine  7.5 mg Oral QHS  . multivitamin with minerals  1 tablet Oral Daily  . pioglitazone  15 mg Oral Daily  . polyethylene glycol  34 g Oral Daily  . sodium chloride flush  3 mL Intravenous Once  . traMADol  50 mg Oral BID   Continuous Infusions: . sodium chloride 50 mL/hr at 09/21/19 1021     LOS: 8 days     Vernell Leep, MD, [place], [facility]. [facility]  To contact the attending provider between 7A-7P or the covering provider during after hours 7P-7A, please log into the web site [email or web address] and access using universal [place]  password for that web site. If you do not have the password, please call the hospital operator.  09/21/2019, 5:39 PM

## 2019-09-21 NOTE — Progress Notes (Signed)
Occupational Therapy Treatment Patient Details Name: [name] [name] MRN: [id number] DOB: 1941/10/19 Today's Date: 09/21/2019    History of present illness [name] [name] is a 78 y.o. female with medical history significant of cancer in 2002 treated successfully.  Currently with a recurrence with widely metastatic disease involving liver lungs bone.  She is still undergoing treatment for her cancer and had a DVT in the R UE.  She presented with R facial droop and weakness over couple of weeks and now with some speech and swallowing issues.   OT comments  Pt making slow steady progress. Pt back from endoscopy 45 min prior to treatment but still willing to get up and ambulate to bathroom.  Pt overall min assist with mobility with walker and mod assist with LE adls.  Pt with swelling in RUE.  Lymphedema garment donned with min assist.     Follow Up Recommendations  Supervision/Assistance - 24 hour;SNF    Equipment Recommendations       Recommendations for Other Services      Precautions / Restrictions Precautions Precautions: Fall Restrictions Weight Bearing Restrictions: No Other Position/Activity Restrictions: DVT R UE       Mobility Bed Mobility Overal bed mobility: Needs Assistance Bed Mobility: Supine to Sit;Sit to Supine     Supine to sit: Min assist Sit to supine: Mod assist   General bed mobility comments: Pt required assistance to elevate trunk into sitting after moving LEs to edge of bed.  Pt required assistance to lower trunk and boost to [facility].  She was able to lift LEs back to bed against gravity with increased time and effort.  Transfers Overall transfer level: Needs assistance Equipment used: Rolling walker (2 wheeled) Transfers: Sit to/from Stand Sit to Stand: Min assist         General transfer comment: From raised height, pt can transfer with min assist.  Mod assist from low service.      Balance Overall balance assessment: Needs  assistance Sitting-balance support: Feet supported Sitting balance-Leahy Scale: Good     Standing balance support: Bilateral upper extremity supported;During functional activity Standing balance-Leahy Scale: Fair Standing balance comment: UE support for balance                           ADL either performed or assessed with clinical judgement   ADL Overall ADL's : Needs assistance/impaired     Grooming: Wash/dry face;Wash/dry hands;Min guard;Standing;Cueing for safety Grooming Details (indicate cue type and reason): Pt stood at sink to groom.  Did use RUE as gross assist.                 Toilet Transfer: Minimal assistance;Ambulation;RW;BSC Toilet Transfer Details (indicate cue type and reason): Pt walked to bathroom to toilet and did not make it.  Pt urinated on floor in front of commode.  Pt assisted in cleaning self up while sitting on commode.   Toileting- Clothing Manipulation and Hygiene: Moderate assistance;Sit to/from stand Toileting - Clothing Manipulation Details (indicate cue type and reason): more assist needed bc pt did not make it to the commode before urinating.      Functional mobility during ADLs: Minimal assistance;Rolling walker;Cueing for safety General ADL Comments: Pt at times gets walker too far out in front of her.  Pt fatigues quickly but did well with grooming and toileting.     Vision   Vision Assessment?: No apparent visual deficits   Perception  Praxis      Cognition Arousal/Alertness: Awake/alert Behavior During Therapy: WFL for tasks assessed/performed Overall Cognitive Status: Within Functional Limits for tasks assessed Area of Impairment: Memory                     Memory: Decreased short-term memory         General Comments: Not formally assessed but able to function within tasks assessed.        Exercises     Shoulder Instructions       General Comments Pt fatigued after endoscopy today.     Pertinent Vitals/ Pain       Pain Assessment: Faces Faces Pain Scale: Hurts even more Pain Location: throat Pain Descriptors / Indicators: Sore;Aching Pain Intervention(s): Limited activity within patient's tolerance;Repositioned;Monitored during session  Home Living                                          Prior Functioning/Environment              Frequency  Min 2X/week        Progress Toward Goals  OT Goals(current goals can now be found in the care plan section)  Progress towards OT goals: Progressing toward goals  Acute Rehab OT Goals Patient Stated Goal: to gte stronger OT Goal Formulation: With patient Time For Goal Achievement: 10/01/19 Potential to Achieve Goals: Good ADL Goals Pt Will Perform Grooming: with set-up;sitting Pt Will Perform Upper Body Bathing: with set-up;sitting Pt Will Perform Lower Body Bathing: sit to/from stand;with supervision Pt Will Perform Upper Body Dressing: with set-up;sitting Pt Will Perform Lower Body Dressing: sit to/from stand;with supervision Pt Will Transfer to Toilet: with supervision Pt Will Perform Toileting - Clothing Manipulation and hygiene: with supervision;sit to/from stand Pt/caregiver will Perform Home Exercise Program: Increased ROM;Increased strength;Right Upper extremity;With minimal assist  Plan Discharge plan remains appropriate    Co-evaluation                 AM-PAC OT "6 Clicks" Daily Activity     Outcome Measure   Help from another person eating meals?: A Little Help from another person taking care of personal grooming?: A Little Help from another person toileting, which includes using toliet, bedpan, or urinal?: A Lot Help from another person bathing (including washing, rinsing, drying)?: A Lot Help from another person to put on and taking off regular upper body clothing?: A Little Help from another person to put on and taking off regular lower body clothing?: A Lot 6  Click Score: 15    End of Session Equipment Utilized During Treatment: Gait belt;Rolling walker  OT Visit Diagnosis: Unsteadiness on feet (R26.81);Muscle weakness (generalized) (M62.81);Other symptoms and signs involving the nervous system (R29.898);Hemiplegia and hemiparesis Hemiplegia - Right/Left: Right Hemiplegia - dominant/non-dominant: Dominant   Activity Tolerance Patient tolerated treatment well   Patient Left in bed;with call bell/phone within reach;with bed alarm set   Nurse Communication Mobility status        Time: 0938-1829 OT Time Calculation (min): 35 min  Charges: OT General Charges $OT Visit: 1 Visit OT Treatments $Self Care/Home Management : 23-37 mins   Glenford Peers 09/21/2019, 11:49 AM

## 2019-09-21 NOTE — Progress Notes (Cosign Needed)
Length of Need 6 Months   Patient has (list medical condition): metastatic cancer   The above medical condition requires: Patient requires the ability to reposition frequently   Head must be elevated greater than: 30 degrees   Bed type Semi-electric   Support Surface: Gel Overlay

## 2019-09-21 NOTE — Anesthesia Preprocedure Evaluation (Addendum)
Anesthesia Evaluation  Patient identified by MRN, date of birth, ID band Patient awake    Reviewed: Allergy & Precautions, NPO status , Patient's Chart, lab work & pertinent test results  Airway Mallampati: III  TM Distance: >3 FB Neck ROM: Full    Dental  (+) Edentulous Upper, Edentulous Lower   Pulmonary asthma , former smoker,    breath sounds clear to auscultation       Cardiovascular hypertension, Pt. on medications  Rhythm:Regular Rate:Normal  ECHO 11/12 FINDINGS  Left Ventricle: Left ventricular ejection fraction, by visual estimation, is 60 to 65%. The left ventricle has normal function. The left ventricle has no regional wall motion abnormalities. The left ventricular internal cavity size was the LV cavity  size is small. There is no left ventricular hypertrophy. Left ventricular diastolic parameters are consistent with Grade I diastolic dysfunction (impaired relaxation). Normal left atrial pressure.   Neuro/Psych PSYCHIATRIC DISORDERS Anxiety TIACVA    GI/Hepatic GERD  ,  Endo/Other  diabetesMorbid obesity  Renal/GU CRFRenal disease  negative genitourinary   Musculoskeletal  (+) Arthritis ,   Abdominal   Peds negative pediatric ROS (+)  Hematology  (+) Blood dyscrasia, anemia ,   Anesthesia Other Findings   Reproductive/Obstetrics negative OB ROS                            Lab Results  Component Value Date   WBC 11.1 (H) 09/20/2019   HGB 9.1 (L) 09/20/2019   HCT 28.9 (L) 09/20/2019   MCV 90.3 09/20/2019   PLT 275 09/20/2019   Lab Results  Component Value Date   INR 1.2 09/12/2019   INR 1.00 06/19/2018   INR 1.07 10/17/2015   EKG: normal EKG, normal sinus rhythm.    Anesthesia Physical  Anesthesia Plan  ASA: IV  Anesthesia Plan: MAC   Post-op Pain Management:    Induction: Intravenous  PONV Risk Score and Plan:   Airway Management Planned: Nasal Cannula and  Simple Face Mask  Additional Equipment:   Intra-op Plan:   Post-operative Plan:   Informed Consent: I have reviewed the patients History and Physical, chart, labs and discussed the procedure including the risks, benefits and alternatives for the proposed anesthesia with the patient or authorized representative who has indicated his/her understanding and acceptance.       Plan Discussed with: CRNA, Anesthesiologist and Surgeon  Anesthesia Plan Comments: (Breast cancer   2002; metastatic in 2012, right breast, spread to left hip in 2012 Cerebrovascular accident   R thalamic CVA 01/2010 TIA (transient ischemic attack)   x2 in 2011 )      Anesthesia Quick Evaluation

## 2019-09-21 NOTE — Anesthesia Procedure Notes (Signed)
Procedure Name: MAC Date/Time: 09/21/2019 8:22 AM Performed by: Orlie Dakin, CRNA Pre-anesthesia Checklist: Emergency Drugs available, Patient identified, Suction available and Patient being monitored Patient Re-evaluated:Patient Re-evaluated prior to induction Oxygen Delivery Method: Nasal cannula Preoxygenation: Pre-oxygenation with 100% oxygen Induction Type: IV induction Placement Confirmation: positive ETCO2

## 2019-09-21 NOTE — Transfer of Care (Signed)
Immediate Anesthesia Transfer of Care Note  Patient: [name] [name]  Procedure(s) Performed: ESOPHAGOGASTRODUODENOSCOPY (EGD) WITH PROPOFOL (N/A ) ESOPHAGEAL BRUSHING  Patient Location: Endoscopy Unit  Anesthesia Type:MAC  Level of Consciousness: drowsy  Airway & Oxygen Therapy: Patient Spontanous Breathing and Patient connected to nasal cannula oxygen  Post-op Assessment: Report given to RN and Post -op Vital signs reviewed and stable  Post vital signs: Reviewed and stable  Last Vitals:  Vitals Value Taken Time  BP    Temp    Pulse 93 09/21/19 0906  Resp 14 09/21/19 0906  SpO2 100 % 09/21/19 0906  Vitals shown include unvalidated device data.  Last Pain:  Vitals:   09/21/19 0740  TempSrc: Temporal  PainSc: 0-No pain      Patients Stated Pain Goal: 0 (65/53/74 8270)  Complications: No apparent anesthesia complications

## 2019-09-21 NOTE — TOC Progression Note (Signed)
Transition of Care [facility]) - Progression Note    Patient Details  Name: [name] [name] MRN: [id number] Date of Birth: 1941/09/01  [facility]) CM/SW Contact  Pollie Friar, RN Phone Number: 09/21/2019, 4:59 PM  Clinical Narrative:    Pt and son have decided for her to d/c home. Asking for hospital bed. Zack with [employer] aware of orders.  [facility] services. CM will need to call in am to arrange [facility].  Son to provide transport home when ready.   Expected Discharge Plan: [place] Barriers to Discharge: Continued Medical Work up  Expected Discharge Plan and Services Expected Discharge Plan: [place] Choice: [place] arrangements for the past 2 months: Apartment                                       Social Determinants of Health (SDOH) Interventions    Readmission Risk Interventions No flowsheet data found.

## 2019-09-21 NOTE — Op Note (Signed)
[facility] Patient Name: [name] [name] Procedure Date : 09/21/2019 MRN: [id number] Attending MD: Estill Cotta. Loletha Carrow , MD Date of Birth: 1941/03/09 CSN: [id number] Age: 78 Admit Type: Outpatient Procedure:                Upper GI endoscopy Indications:              Esophageal dysphagia - Barium swallow showing                            severe narrowing proximal esophagus with barium                            tablet prolonged delay passage. Patieint with                            metastatic breast cancer and prior radiation                            therapy to upper chest and neck. Providers:                Estill Cotta. Loletha Carrow, MD, Ashley Jacobs, RN, Marguerita Merles, Technician Referring MD:             [facility] Medicines:                Monitored Anesthesia Care Complications:            No immediate complications. Estimated Blood Loss:     Estimated blood loss was minimal. Procedure:                Pre-Anesthesia Assessment:                           - Prior to the procedure, a History and Physical                            was performed, and patient medications and                            allergies were reviewed. The patient's tolerance of                            previous anesthesia was also reviewed. The risks                            and benefits of the procedure and the sedation                            options and risks were discussed with the patient.                            All questions were answered, and informed consent  was obtained. Prior Anticoagulants: The patient has                            taken Eliquis (apixaban), last dose was 3 days                            prior to procedure. ASA Grade Assessment: III - A                            patient with severe systemic disease. After                            reviewing the risks and benefits, the patient was       deemed in satisfactory condition to undergo the                            procedure.                           After obtaining informed consent, the endoscope was                            passed under direct vision. Throughout the                            procedure, the patient's blood pressure, pulse, and                            oxygen saturations were monitored continuously. The                            [id number]-H190 ([id number] Olympus gastroscope was                            introduced through the mouth, and advanced to the                            second part of duodenum. The [id number]-[id number] ([id number]                            Olympus Ultraslim gastroscope was introduced                            through the and advanced to the. The upper GI                            endoscopy was accomplished without difficulty. The                            patient tolerated the procedure well. Scope In: Scope Out: Findings:      One benign-appearing, intrinsic moderate stenosis was found in the upper       third of the esophagus. The stenosis was traversed. It was not a web or  fibrotic stricture, but generally narrow lumen for entire upper third of       esophagus, and to a lesser esgree in remainder of esophagus until more       severe distal stricture (see below)      One benign-appearing, intrinsic severe stenosis was found in the distal       esophagus. This stenosis measured 6 mm (inner diameter) x 2 cm (in       length). The stenosis was traversed after dilation. A guidewire was       placed under fluoroscopic guidance and the scope was withdrawn. Dilation       was performed with a Savary dilator with no resistance at 8 mm, mild       resistance at 10 mm and moderate resistance at 12 mm (with repeat       endoscopy after 11mm dilation). The dilation site was examined following       endoscope reinsertion and showed moderate mucosal disruption and       moderate improvement  in luminal narrowing.      Diffuse, white plaques were found in the entire esophagus. Brushings       were obtained in the middle third of the esophagus (difficult to       determine if candida or food/medicine debris).      The stomach was normal.      The cardia and gastric fundus were normal on retroflexion.      The examined duodenum was normal. Impression:               - Benign-appearing esophageal stenosis. Dilated.                           The entire esophagus was narrow.                           - Esophageal plaques were found, suspicious for                            candidiasis. Brushings performed.                           - Normal stomach.                           - Normal examined duodenum.                           The entire esophagus is of a narrow caliber, with                            more prominent proximal narrowing possibly from                            extrinsic disease or prior radiation. Distal                            stricture was more severe. Recommendation:           - Return patient to referring hospital for ongoing  care.                           - Clear liquid diet for 2 hours, then soft diet                            indefinitely. All meds should be crushed if                            possible and taken with applesauce or yogurt.                           - Continue present medications.                           - Await pathology results.                           - Resume Eliquis at usual dose tomorrow. Procedure Code(s):        --- Professional ---                           [phone], Esophagogastroduodenoscopy, flexible,                            transoral; with insertion of guide wire followed by                            passage of dilator(s) through esophagus over guide                            wire                           [id number], Intraluminal dilation of strictures and/or                             obstructions (eg, esophagus), radiological                            supervision and interpretation Diagnosis Code(s):        --- Professional ---                           K22.2, Esophageal obstruction                           K22.9, Disease of esophagus, unspecified                           R13.14, Dysphagia, pharyngoesophageal phase CPT copyright 2019 [employer]. All rights reserved. The codes documented in this report are preliminary and upon coder review may  be revised to meet current compliance requirements. Henry L. Loletha Carrow, MD 09/21/2019 9:15:27 AM This report has been signed electronically. Number of Addenda: 0

## 2019-09-22 DIAGNOSIS — E43 Unspecified severe protein-calorie malnutrition: Secondary | ICD-10-CM

## 2019-09-22 DIAGNOSIS — K222 Esophageal obstruction: Secondary | ICD-10-CM

## 2019-09-22 DIAGNOSIS — K228 Other specified diseases of esophagus: Secondary | ICD-10-CM

## 2019-09-22 LAB — BASIC METABOLIC PANEL
Anion gap: 10 (ref 5–15)
BUN: 39 mg/dL — ABNORMAL HIGH (ref 8–23)
CO2: 18 mmol/L — ABNORMAL LOW (ref 22–32)
Calcium: 8.5 mg/dL — ABNORMAL LOW (ref 8.9–10.3)
Chloride: 110 mmol/L (ref 98–111)
Creatinine, Ser: 2.47 mg/dL — ABNORMAL HIGH (ref 0.44–1.00)
GFR calc Af Amer: 21 mL/min — ABNORMAL LOW (ref >60)
GFR calc non Af Amer: 18 mL/min — ABNORMAL LOW (ref >60)
Glucose, Bld: 70 mg/dL (ref 70–99)
Potassium: 4.1 mmol/L (ref 3.5–5.1)
Sodium: 138 mmol/L (ref 135–145)

## 2019-09-22 MED ORDER — APIXABAN 5 MG PO TABS
5.0000 mg | ORAL_TABLET | Freq: Two times a day (BID) | ORAL | Status: DC
  Administered 2019-09-22: 5 mg via ORAL
  Filled 2019-09-22: qty 1

## 2019-09-22 MED ORDER — POLYETHYLENE GLYCOL 3350 17 G PO PACK: 17.0000 g | PACK | Freq: Two times a day (BID) | ORAL | 0 refills | Status: AC

## 2019-09-22 MED ORDER — PHENOL 1.4 % MT LIQD
1.0000 | OROMUCOSAL | Status: DC | PRN
  Administered 2019-09-22 (×2): 1 via OROMUCOSAL
  Filled 2019-09-22: qty 177

## 2019-09-22 MED ORDER — HEPARIN SOD (PORK) LOCK FLUSH 100 UNIT/ML IV SOLN
500.0000 [IU] | INTRAVENOUS | Status: AC | PRN
  Administered 2019-09-22: 19:00:00 500 [IU]
  Filled 2019-09-22: qty 5

## 2019-09-22 MED ORDER — ADULT MULTIVITAMIN W/MINERALS CH: 1.0000 | ORAL_TABLET | Freq: Every day | ORAL | Status: AC

## 2019-09-22 MED ORDER — ENSURE ENLIVE PO LIQD: 237.0000 mL | Freq: Three times a day (TID) | ORAL | 12 refills | Status: AC

## 2019-09-22 MED ORDER — DOCUSATE SODIUM 50 MG/5ML PO LIQD: 100.0000 mg | Freq: Two times a day (BID) | ORAL | 0 refills | Status: AC

## 2019-09-22 NOTE — Progress Notes (Signed)
Patient being discharged home, son providing transportation. Educated patient on discharge instructions, belongings returned to patient.  [name] [name] A [name] [name]

## 2019-09-22 NOTE — Discharge Instructions (Signed)

## 2019-09-22 NOTE — Progress Notes (Signed)
ANTICOAGULATION CONSULT NOTE - Initial Consult  Pharmacy Consult for Eliquis Indication: DVT  No Known Allergies  Patient Measurements: Height: 5\' 3"  (160 cm) Weight: 153 lb 3.5 oz (69.5 kg) IBW/kg (Calculated) : 52.4   Vital Signs: Temp: 97.6 F (36.4 C) (11/21 1133) Temp Source: Oral (11/21 1133) BP: 101/57 (11/21 1133) Pulse Rate: 79 (11/21 1133)  Labs: Recent Labs    09/20/19 0658 09/21/19 0500 09/22/19 0459  HGB 9.1*  --   --   HCT 28.9*  --   --   PLT 275  --   --   CREATININE 3.01* 2.76* 2.47*    Estimated Creatinine Clearance: 18.1 mL/min (A) (by C-G formula based on SCr of 2.47 mg/dL (H)).   Medical History: Past Medical History:  Diagnosis Date  . Anxiety    hx of  . Asthma   . Breast cancer ([place])    2002; metastatic in 2012, right breast, spread to left hip in 2012  . Cerebrovascular accident [facility])    R thalamic CVA 01/2010  . Chemotherapy induced neutropenia ([place]) 08/15/2017  . Dyslipidemia   . Encounter for therapeutic drug monitoring 04/14/2017  . Goals of care, counseling/discussion 04/19/2017  . Hyperlipemia   . Hypertension   . Low back pain   . Osteoarthritis   . TIA (transient ischemic attack)    x2 in 2011  . Vertigo 2014     Assessment: Patient presented to the emergency room with sudden onset right face numbness, and some left leg numbness, Neurology saw and signed off 11/12 and didn't think consistent of CVA. Dysphagia evaluated by GI who performed an EGD and gave the ok to resume PTA apixaban for DVT.  Goal of Therapy:  Monitor platelets by anticoagulation protocol: Yes   Plan:  Apixaban 5mg  BID Will get baseline CBC and can consider checking again if signs and symptoms of bleeding  Phillis Haggis 09/22/2019,1:06 PM

## 2019-09-22 NOTE — Discharge Summary (Signed)
Physician Discharge Summary  [name] [name] XNT:[id number] DOB: 1941/10/07  PCP: Cassandria Anger, MD  Admitted from: Home Discharged to: Home.  Patient and son declined SNF.  Admit date: 09/12/2019 Discharge date: 09/22/2019  Recommendations for Outpatient Follow-up:    Contact information for follow-up providers    [name], [name], MD. Schedule an appointment as soon as possible for a visit in 1 week(s).   Specialty: Internal Medicine Why: To be seen with repeat labs (CBC & BMP). Contact information: [place] [address] [phone]                   Home Health: PT, OT, SLP, home health aide & clinical social worker. Equipment/Devices: Hospital bed  Discharge Condition: Improved and stable CODE STATUS: Full Diet recommendation: Heart healthy diet.  Diet consistency should be soft food, indefinitely.  This has been clearly advised to both patient and her son who verbalized understanding.  Discharge Diagnoses:  Active Problems:   Essential hypertension   Situational anxiety   Breast cancer metastasized to bone, right (HCC)   Chest pain   Facial weakness   Protein-calorie malnutrition, severe   Esophageal stricture   Brief Summary: 78 year old female with history of recurrent widely metastatic breast cancer on chemotherapy, with metastasis to lungs, bones and liver, history of palliative radiation to the neck and subclavian area, chronic right arm weakness and stiffness, hypertension, dyslipidemia, osteoarthritis, history of DVT on Eliquis presented to the emergency room with sudden onset right face numbness, and some left leg numbness, Neurology seen and signed off 11/12 and didn't think consistent of CVA. Dysphagia evaluated by [facility], s/p EGD with dilatation on 11/20   Assessment & Plan:  Transient right facial weakness, left leg weakness - facial weaknes and LLE weakness have resolved - MRI negative for acute ischemic  events or metastasis - SLP evaluation: Dys 2 diet w/ thin liquids.  [facility] recommended soft diet post EGD. - TTE: EF 60 to 65% - Apixaban briefly held for EGD and then resumed the next day. -PT/OT rec SNF but patient and son declined SNF and decided to go home.  CM assisted with home health therapies and DME as noted above. -Improved and stable.  Bilateral pleural effusion, dyspnea on exertion - Could be malignant effusions, has widely metastatic pulmonary disease - echo EF 60-65%, trivial pericardial effusion;comfortableon RA - stable small-to-moderated R pleural effusion; left pleural effusion is small; no PNA seen on CT, but stable b/l nodules are noted -Has not complained of dyspnea for the last couple days and not hypoxic.  May periodically follow-up chest x-ray as outpatient.  Recurrent widely metastatic breast cancer - Prognosis is poor, anticipate she will need a goals of care discussion in the near future, followed by Dr. Lindi Adie, at this time insists on full code and full scope of treatment - Receiving palliative chemotherapy with Keytruda infusions every 3 weeks, last treatment was 11/3  Stage 3-4 chronic kidney disease - Baseline creatinine around 2-2.4 -Creatinine had gone up to greater than 3.  HCTZ and ARB were discontinued.  Briefly hydrated with IV fluids.  Creatinine has come back down to 2.4.  Remains at high risk for recurrent acute kidney injury due to overall poor oral intake.  Monitor BMP closely.  Chronic right arm DVT  - chronic right arm weakness and stiffness, -Eliquis was held prior to EGD and resumed on day of discharge.  Essential Hypertension -Controlled on amlodipine alone.  Discontinued ARB  and HCTZ indefinitely due to concern for recurrent acute kidney injury.  Dysphagia - [facility] follow up appreciated.  She had a barium swallow which showed proximal esophageal web preventing passage  of a 13 mm barium tablet.  Last dose of Eliquis prior to procedure: 11/17.  S/P EGD and dilatation of benign-appearing esophageal stenosis on 11/20.  Please see detailed report and recommendations as below.  Patient reported some throat discomfort.  Tolerated some food at lunch today.  She feels comfortable going home.  Body mass index is 27.14 kg/m.  Nutritional Status Nutrition Problem: Severe Malnutrition Etiology: chronic illness(metastatic breast cancer) Signs/Symptoms: energy intake < 75% for > or equal to 1 month, percent weight loss Percent weight loss: 13.8 % Interventions: MVI, Boost Breeze, Magic cup, Ensure Enlive (each supplement provides 350kcal and 20 grams of protein)   Consultants:  Neurology [facility]   Procedures:  EGD 09/21/2019:  Impression: - Benign-appearing esophageal stenosis. Dilated. The entire esophagus was narrow. - Esophageal plaques were found, suspicious for candidiasis. Brushings performed. - Normal stomach. - Normal examined duodenum. The entire esophagus is of a narrow caliber, with more prominent proximal narrowing possibly from extrinsic disease or prior radiation. Distal stricture was more severe.  Recommendations:  - Return patient to referring hospital for ongoing care. - Clear liquid diet for 2 hours, then soft diet indefinitely. All meds should be crushed if possible and taken with applesauce or yogurt. - Continue present medications. - Await pathology results. - Resume Eliquis at usual dose tomorrow. Discharge Instructions  Discharge Instructions    Call MD for:  difficulty breathing, headache or visual disturbances   Complete by: As directed    Call MD for:  extreme fatigue   Complete by: As directed    Call MD for:  persistant dizziness or light-headedness   Complete by: As directed    Call MD for:  persistant nausea and vomiting   Complete by: As directed    Call MD for:  severe uncontrolled pain   Complete by: As  directed    Call MD for:  temperature >100.4   Complete by: As directed    Diet - low sodium heart healthy   Complete by: As directed    No hard foods.  Soft diet indefinitely. All meds should be crushed if possible and taken with applesauce or yogurt.   Discharge instructions   Complete by: As directed    feeding supplement (BOOST / RESOURCE BREEZE) liquid 1 Container 1 Container, Oral, 2 times daily between meals   Increase activity slowly   Complete by: As directed        Medication List    STOP taking these medications   hydrochlorothiazide 25 MG tablet Commonly known as: HYDRODIURIL   losartan 100 MG tablet Commonly known as: COZAAR     TAKE these medications   ALPRAZolam 0.5 MG tablet Commonly known as: Xanax Take 1 tablet (0.5 mg total) by mouth 2 (two) times daily as needed for anxiety or sleep.   amLODipine 10 MG tablet Commonly known as: NORVASC Take 1 tablet (10 mg total) by mouth daily.   apixaban 5 MG Tabs tablet Commonly known as: ELIQUIS Take 1 tablet (5 mg total) by mouth 2 (two) times daily. Take 10 mg bid for 7 days orally and then decrease to 5 mg bid orally thereafter.   clotrimazole-betamethasone cream Commonly known as: Lotrisone Apply 1 application topically 2 (two) times daily.   docusate 50 MG/5ML liquid Commonly known as:  COLACE Take 10 mLs (100 mg total) by mouth 2 (two) times daily.   dronabinol 2.5 MG capsule Commonly known as: MARINOL Take 1 capsule (2.5 mg total) by mouth 2 (two) times daily before a meal.   feeding supplement (ENSURE ENLIVE) Liqd Take 237 mLs by mouth 3 (three) times daily between meals.   gabapentin 100 MG capsule Commonly known as: NEURONTIN Take 1 capsule (100 mg total) by mouth at bedtime.   lidocaine-prilocaine cream Commonly known as: EMLA Apply to affected area once   meclizine 12.5 MG tablet Commonly known as: ANTIVERT Take 1 tablet (12.5 mg total) by mouth 2 (two) times daily as needed for  dizziness.   mirtazapine 7.5 MG tablet Commonly known as: REMERON Take 7.5 mg by mouth daily.   multivitamin with minerals Tabs tablet Take 1 tablet by mouth daily. Start taking on: September 23, 2019   nystatin powder Commonly known as: MYCOSTATIN/NYSTOP Apply 1 Dose topically daily.   ondansetron 8 MG tablet Commonly known as: ZOFRAN Take 1 tablet (8 mg total) by mouth every 8 (eight) hours as needed for nausea or vomiting.   pioglitazone 15 MG tablet Commonly known as: Actos Take 1 tablet (15 mg total) by mouth daily.   polyethylene glycol 17 g packet Commonly known as: MIRALAX / GLYCOLAX Take 17 g by mouth 2 (two) times daily.   prochlorperazine 10 MG tablet Commonly known as: COMPAZINE Take 1 tablet (10 mg total) by mouth every 6 (six) hours as needed (Nausea or vomiting).   traMADol 50 MG tablet Commonly known as: ULTRAM Take 50 mg by mouth 2 (two) times daily.   Vitamin D3 25 MCG (1000 UT) Caps Take 1 capsule by mouth daily.   Zoledronic Acid 4 MG/100ML IVPB Commonly known as: ZOMETA Inject 4 mg into the vein every 3 (three) months.      No Known Allergies    Procedures/Studies: Ct Chest Wo Contrast  Result Date: 09/12/2019 CLINICAL DATA:  Chest pain. Shortness of breath. Metastatic breast cancer. EXAM: CT CHEST WITHOUT CONTRAST TECHNIQUE: Multidetector CT imaging of the chest was performed following the standard protocol without IV contrast. COMPARISON:  08/10/2019 FINDINGS: Cardiovascular: The heart size is normal. No substantial pericardial effusion. Coronary artery calcification is evident. Atherosclerotic calcification is noted in the wall of the thoracic aorta. Prominence of the main pulmonary arteries suggests pulmonary arterial hypertension. Similar appearance of pericardial nodules. Index nodule measured previously at 7 x 11 mm is 8 x 12 mm today. Right Port-A-Cath tip is in the distal SVC. Mediastinum/Nodes: No mediastinal lymphadenopathy. No  evidence for gross hilar lymphadenopathy although assessment is limited by the lack of intravenous contrast on today's study. The esophagus has normal imaging features. There is no axillary lymphadenopathy. Lungs/Pleura: Stable pleuroparenchymal scarring in the right apex, likely radiation fibrosis. Bilateral pulmonary nodules again noted. Posterior left upper lobe nodule measuring 13 mm today was 12 mm when remeasured in a similar fashion on the prior study. 15 mm anterior left lung nodule (51/5) was 14 mm when measured in a similar fashion on the prior study. 2 cm right infrahilar lower lobe nodule (86/5) was 2 cm previously. Other scattered pulmonary nodules are similar to prior. Small to moderate right pleural effusion is similar. A small left pleural effusion has progressed in the interval. Upper Abdomen: Heterogeneous liver parenchyma compatible with the patient's known metastatic disease, not well demonstrated on noncontrast imaging today. Left adrenal lesion incompletely visualized but approximately 2.7 x 1.1 cm today compared to 2.7  x 1.1 cm previously. Musculoskeletal: Widespread sclerotic bony metastases are similar. 11 mm sclerotic lesion posterior T12 vertebral body on today's study was 11 mm when remeasured in a similar fashion on the prior exam. IMPRESSION: 1. Bilateral pulmonary nodules are stable to minimally progressed in the short interval since prior study. 2. Small to moderate right pleural effusion is stable. A small left pleural effusion on today's study has increased in the interval since prior exam. 3. Similar appearance of pericardial nodules consistent with metastatic disease. 4. Marked heterogeneity of liver parenchyma consistent with bulky metastatic involvement. This is not well demonstrated on today's noncontrast exam. 5. Widespread bony metastatic disease, similar to prior. Electronically Signed   By: Misty Stanley M.D.   On: 09/12/2019 18:31   Mr Brain Wo Contrast  Result Date:  09/13/2019 CLINICAL DATA:  Right face and arm numbness. History of metastatic breast cancer. EXAM: MRI HEAD WITHOUT CONTRAST TECHNIQUE: Multiplanar, multiecho pulse sequences of the brain and surrounding structures were obtained without intravenous contrast. COMPARISON:  Brain MRI 06/29/2014 FINDINGS: BRAIN: There is no acute infarct, acute hemorrhage or extra-axial collection. Multifocal white matter hyperintensity, most commonly due to chronic ischemic microangiopathy. There are old thalamic and pontine small vessel infarcts. There is generalized atrophy without lobar predilection. The midline structures are normal. VASCULAR: The major intracranial arterial and venous sinus flow voids are normal. Susceptibility-sensitive sequences show no chronic microhemorrhage or superficial siderosis. SKULL AND UPPER CERVICAL SPINE: Calvarial bone marrow signal is normal. There is no skull base mass. The visualized upper cervical spine and soft tissues are normal. SINUSES/ORBITS: There are no fluid levels or advanced mucosal thickening. The mastoid air cells and middle ear cavities are free of fluid. The orbits are normal. IMPRESSION: 1. No acute intracranial abnormality or metastatic disease. 2. Old thalamic and pontine small vessel infarcts. 3. Generalized atrophy and moderate chronic small vessel disease. Electronically Signed   By: Ulyses Jarred M.D.   On: 09/13/2019 02:45   Nm Pulmonary Perfusion  Result Date: 09/12/2019 CLINICAL DATA:  Breast cancer, history pulmonary embolism, acute onset of RIGHT upper extremity weakness and facial paresthesias today EXAM: NUCLEAR MEDICINE PERFUSION LUNG SCAN TECHNIQUE: Perfusion images were obtained in multiple projections after intravenous injection of radiopharmaceutical. Ventilation scans intentionally deferred if perfusion scan and chest x-ray adequate for interpretation during COVID 19 epidemic. RADIOPHARMACEUTICALS:  1.5 mCi Tc-55m MAA IV COMPARISON:  None FINDINGS:  Diminished perfusion at the lung bases greater posteriorly. Patient has bibasilar atelectasis and small LEFT pleural effusion on radiograph. No additional segmental or subsegmental perfusion defects are identified. Ventilation exam was not performed, unknown COVID status. Findings represent a low probability for pulmonary embolism. IMPRESSION: Mildly diminished perfusion at the lung bases especially posteriorly, less significant than suspected from the bibasilar atelectasis and LEFT pleural effusion seen on chest radiograph. Low probability of pulmonary embolism. Electronically Signed   By: Lavonia Dana M.D.   On: 09/12/2019 18:00   Dg Chest Portable 1 View  Result Date: 09/12/2019 CLINICAL DATA:  Chest pain short of breath.  Breast cancer. EXAM: PORTABLE CHEST 1 VIEW COMPARISON:  06/29/2014.  Chest CT 06/10/2019 FINDINGS: Mild bibasilar airspace disease left greater than right. Small bilateral effusions. Small densities left upper lobe and left hilum. Possible small density right upper lobe. These are compatible with metastatic deposits as seen on recent CT. Port-A-Cath tip in the lower SVC. IMPRESSION: Bibasilar airspace disease and small pleural effusions. Possible pneumonia or malignant effusion. Metastatic disease in the lungs. Electronically Signed  By: Franchot Gallo M.D.   On: 09/12/2019 15:50   Dg Esophagus Dilation  Result Date: 09/21/2019 ESOPHAGEAL DILATATION: Fluoroscopy was provided for use by the requesting physician.  No images were obtained for radiographic interpretation.  Ct Head Code Stroke Wo Contrast  Result Date: 09/12/2019 CLINICAL DATA:  Code stroke.  Left facial droop.  Stroke EXAM: CT HEAD WITHOUT CONTRAST TECHNIQUE: Contiguous axial images were obtained from the base of the skull through the vertex without intravenous contrast. COMPARISON:  CT head 06/29/2014 FINDINGS: Brain: Mild atrophy which has progressed in the interval. Negative for hydrocephalus. Small hypodensity  right pons compatible with chronic infarct is unchanged. Negative for acute infarct.  Negative for hemorrhage or mass. Vascular: Negative for hyperdense vessel Skull: Negative Sinuses/Orbits: Air-fluid level sphenoid sinus. Remaining sinuses clear. Negative orbit Other: None ASPECTS ([place] Stroke Program Early CT Score) - Ganglionic level infarction (caudate, lentiform nuclei, internal capsule, insula, M1-M3 cortex): 7 - Supraganglionic infarction (M4-M6 cortex): 3 Total score (0-10 with 10 being normal): 10 IMPRESSION: 1. No acute intracranial abnormality. 2. ASPECTS is 10 Electronically Signed   By: Franchot Gallo M.D.   On: 09/12/2019 14:33   Dg Esophagus W Single Cm (sol Or Thin Ba)  Result Date: 09/17/2019 CLINICAL DATA:  Solid food dysphagia with regurgitation. EXAM: ESOPHOGRAM/BARIUM SWALLOW TECHNIQUE: Single contrast examination was performed using  thin barium. FLUOROSCOPY TIME:  Fluoroscopy Time: 1 minutes and 42 seconds of low-dose pulsed fluoroscopy. Radiation Exposure Index (if provided by the fluoroscopic device): Number of Acquired Spot Images: 19.6 mGy COMPARISON:  Chest CT 09/12/2019. Neck CT 01/03/2019. FINDINGS: Study is mildly limited by the patient's limited mobility. Examination was performed supine, semi erect and in the right lateral decubitus positions. The patient swallowed the barium without difficulty. The esophageal motility is within normal limits. No aspiration was identified. There is an esophageal web at the level of the thoracic inlet which prevented the passage of a 13 mm barium tablet. No mucosal ulceration identified in this region. No other esophageal strictures are identified. There is a small distal esophageal epiphrenic diverticulum. Port-A-Cath and scattered sclerotic osseous lesions are noted in this patient with a history of metastatic breast cancer. IMPRESSION: 1. Restrictive focal esophageal web at the level of the thoracic inlet preventing the passage of a 13 mm  barium tablet, likely symptomatic. 2. Small distal esophageal epiphrenic diverticulum. 3. No mucosal ulceration or other significant findings. Electronically Signed   By: Richardean Sale M.D.   On: 09/17/2019 09:55      Subjective: Patient reports some throat discomfort.  Tolerated liquid diet but hesitant to consume soft food.  Subsequently reported by RN that she did take some soft food.  No abdominal pain.  Urinating well.  Denies dyspnea or chest pain.  Discharge Exam:  Vitals:   09/22/19 0432 09/22/19 0850 09/22/19 1133 09/22/19 1530  BP: (!) 99/51 (!) 103/51 (!) 101/57 (!) 92/48  Pulse: 86 85 79 81  Resp: 18 18 18 18   Temp: 98.8 F (37.1 C) 98.2 F (36.8 C) 97.6 F (36.4 C) 98.3 F (36.8 C)  TempSrc: Oral Oral Oral Oral  SpO2: 98% 96% 99% 97%  Weight:      Height:        General exam: Elderly female lying comfortably in bed.  Does not appear in any distress. Respiratory system:  Slightly diminished breath sounds in the bases but otherwise clear to auscultation.  Left upper chest Port-A-Cath. Cardiovascular system: S1 & S2 heard, RRR. No JVD,  murmurs, rubs, gallops or clicks.    No pedal edema.  Telemetry personally reviewed: Sinus rhythm.  Occasional nonsustained SVT. Gastrointestinal system: Abdomen is nondistended, soft and nontender. No organomegaly or masses felt. Normal bowel sounds heard. Central nervous system: Alert and oriented x2. No focal neurological deficits. Extremities: Symmetric 5 x 5 power. Skin: No rashes, lesions or ulcers Psychiatry: Judgement and insight appear normal. Mood & affect appropriate.     The results of significant diagnostics from this hospitalization (including imaging, microbiology, ancillary and laboratory) are listed below for reference.     Microbiology: Recent Results (from the past 240 hour(s))  SARS CORONAVIRUS 2 (TAT 6-24 HRS) Nasopharyngeal Nasopharyngeal Swab     Status: None   Collection Time: 09/12/19  6:52 PM   Specimen:  Nasopharyngeal Swab  Result Value Ref Range Status   SARS Coronavirus 2 NEGATIVE NEGATIVE Final    Comment: (NOTE) SARS-CoV-2 target nucleic acids are NOT DETECTED. The SARS-CoV-2 RNA is generally detectable in upper and lower respiratory specimens during the acute phase of infection. Negative results do not preclude SARS-CoV-2 infection, do not rule out co-infections with other pathogens, and should not be used as the sole basis for treatment or other patient management decisions. Negative results must be combined with clinical observations, patient history, and epidemiological information. The expected result is Negative. Fact Sheet for Patients: SugarRoll.be Fact Sheet for Healthcare Providers: https://www.woods-mathews.com/ This test is not yet approved or cleared by the [place] FDA and  has been authorized for detection and/or diagnosis of SARS-CoV-2 by FDA under an Emergency Use Authorization (EUA). This EUA will remain  in effect (meaning this test can be used) for the duration of the COVID-19 declaration under Section 56 4(b)(1) of the Act, 21 U.S.C. section 360bbb-3(b)(1), unless the authorization is terminated or revoked sooner. Performed at [facility], [place] [address] [place], [place] [address]   SARS CORONAVIRUS 2 (TAT 6-24 HRS) Nasopharyngeal Nasopharyngeal Swab     Status: None   Collection Time: 09/17/19  4:37 PM   Specimen: Nasopharyngeal Swab  Result Value Ref Range Status   SARS Coronavirus 2 NEGATIVE NEGATIVE Final    Comment: (NOTE) SARS-CoV-2 target nucleic acids are NOT DETECTED. The SARS-CoV-2 RNA is generally detectable in upper and lower respiratory specimens during the acute phase of infection. Negative results do not preclude SARS-CoV-2 infection, do not rule out co-infections with other pathogens, and should not be used as the sole basis for treatment or other patient management  decisions. Negative results must be combined with clinical observations, patient history, and epidemiological information. The expected result is Negative. Fact Sheet for Patients: SugarRoll.be Fact Sheet for Healthcare Providers: https://www.woods-mathews.com/ This test is not yet approved or cleared by the [place] FDA and  has been authorized for detection and/or diagnosis of SARS-CoV-2 by FDA under an Emergency Use Authorization (EUA). This EUA will remain  in effect (meaning this test can be used) for the duration of the COVID-19 declaration under Section 56 4(b)(1) of the Act, 21 U.S.C. section 360bbb-3(b)(1), unless the authorization is terminated or revoked sooner. Performed at [facility], [place] [address], [place], [place] [address]      Labs: CBC: Recent Labs  Lab 09/16/19 0700 09/18/19 0409 09/20/19 0658  WBC 7.7 9.7 11.1*  NEUTROABS 5.9 7.1  --   HGB 9.4* 9.3* 9.1*  HCT 29.4* 29.4* 28.9*  MCV 90.5 89.1 90.3  PLT 264 265 503    Basic Metabolic Panel: Recent Labs  Lab 09/16/19 0700 09/17/19 0749 09/18/19 0409 09/18/19 0411 09/20/19 0658 09/21/19 0500 09/22/19 0459  NA 134* 136  --  137 138 139 138  K 4.6 4.4  --  4.8 4.6 4.2 4.1  CL 103 106  --  105 105 107 110  CO2 21* 21*  --  21* 21* 21* 18*  GLUCOSE 89 85  --  88 77 85 70  BUN 34* 33*  --  34* 44* 43* 39*  CREATININE 3.23* 2.69*  --  2.61* 3.01* 2.76* 2.47*  CALCIUM 9.1 9.2  --  9.8 9.4 9.1 8.5*  MG 2.9*  --  2.8*  --   --   --   --   PHOS 4.0 3.5  --  3.6  --   --   --     Liver Function Tests: Recent Labs  Lab 09/16/19 0700 09/17/19 0749 09/18/19 0411  ALBUMIN 2.3* 2.2* 2.1*    Discussed in detail with patient's son via phone, updated care and answered questions   Time coordinating discharge: 40 minutes  SIGNED:  Vernell Leep, MD, FACP, [facility]. [facility]  To contact the attending provider between 7A-7P or the  covering provider during after hours 7P-7A, please log into the web site [email or web address] and access using universal [place] password for that web site. If you do not have the password, please call the hospital operator.

## 2019-09-22 NOTE — Care Management (Addendum)
LVM w [facility] for [facility] services, if I do not hear back with confirmation that they can accept patient by 4:30 I will make a referral to another agency. Awaiting to hear back from [facility] for delivery time of home hospital bed. Per patient's son [name] [name], he cannot accept patient home without bed there first. He was able to confirm that he would be picking up patient from the hospital.   AddendumJackquline [name] is able to accept for [facility] services.   Addendum- Spoke w patient's son and bed is being delivered to house now.

## 2019-09-22 NOTE — Progress Notes (Signed)
Patient was nauseous after swallowing medications. Vomited once, yellow emesis. PRN Zofran given, patient states relief.

## 2019-09-23 ENCOUNTER — Encounter (HOSPITAL_COMMUNITY): Payer: Self-pay | Admitting: Gastroenterology

## 2019-09-24 ENCOUNTER — Other Ambulatory Visit: Payer: Self-pay

## 2019-09-24 ENCOUNTER — Telehealth: Payer: Self-pay | Admitting: *Deleted

## 2019-09-24 ENCOUNTER — Ambulatory Visit: Payer: Self-pay

## 2019-09-24 LAB — CYTOLOGY - NON PAP

## 2019-09-24 NOTE — Telephone Encounter (Signed)
Transition Care Management Follow-up Telephone Call  Date discharged? 09/22/19   How have you been since you were released from the hospital? Spoke w/son Karrie Doffing) he states mom was doing ok.    Do you understand why you were in the hospital? YES   Do you understand the discharge instructions? YES   Where were you discharged to? Home   Items Reviewed:  Medications reviewed: YES, no longer taking the Losartan & HCTZ which has been updated on pt chart  Allergies reviewed: YES, no changes  Dietary changes reviewed: YES, Karrie Doffing states she is still not eating as much but she has been eating some  Referrals reviewed: YES, he states home health agent has contacted them. He is not sure of which agency   Functional Questionnaire:   Activities of Daily Living (ADLs):   He states she are independent in the following: bathing and hygiene, feeding, continence, grooming, toileting and dressing States she require assistance with the following: ambulation   Any transportation issues/concerns?: NO   Any patient concerns? NO   Confirmed importance and date/time of follow-up visits scheduled YES, appt 10/01/19  Provider Appointment booked with Dr. Alain Marion  Confirmed with patient if condition begins to worsen call PCP or go to the ER.  Patient was given the office number and encouraged to call back with question or concerns.  : YES

## 2019-09-24 NOTE — Progress Notes (Signed)
Patient Care Team: [name], [name], MD as PCP - General (Internal Medicine) Curt Bears, MD (Hematology and Oncology) Paralee Cancel, MD (Orthopedic Surgery) Tyler Pita, MD (Radiation Oncology) Nicholas Lose, MD as Consulting Physician (Hematology and Oncology) Jon Billings, RN as [place] Management  DIAGNOSIS:    ICD-10-CM   1. Breast cancer metastasized to bone, right ([place])  C50.911 Ambulatory referral to Hospice   C79.51 For home use only DME oxygen    CANCELED: For home use only DME oxygen  2. Port catheter in place  Z95.828 heparin lock flush 100 unit/mL    SUMMARY OF ONCOLOGIC HISTORY: Oncology History  Breast cancer metastasized to bone, right ([place])  05/22/2001 Initial Diagnosis   Rt breast: stage IIb (T2, N1, M0) invasive right breast carcinoma with positive estrogen and progesterone receptor as well as HER-2/neu   06/12/2001 Surgery   right lumpectomy with right axillary lymph node dissection revealing 4/9 lymph nodes that were positive for malignancy.    08/12/2001 - 01/10/2002 Chemotherapy   Adj chemo CMF X 8    05/07/2002 - 12/14/2007 Anti-estrogen oral therapy   Adj Tamoxifen for 2 years and then switched to Letrozole (by Dr.Khan in Aug 2005), stopped Feb 2009   02/11/2003 - 04/05/2003 Radiation Therapy   Adj XRT by Tammi Klippel   03/18/2011 -  Anti-estrogen oral therapy   Anastrozole 1 mg daily   04/01/2011 Surgery   Left total hip replacement secondary to pathologic left femoral neck fracture   04/25/2017 Relapse/Recurrence   Anastrozole with Ibrance discontinued 05/30/2018 due to disease progression, Xgeva for bone metastases every 3 months   06/19/2018 Procedure   Liver biopsy: Metastatic breast cancer, ER 100%, PR 0%, Ki-67 50%, HER-2 negative   07/06/2018 Miscellaneous   Foundation 1 results: Microsatellite stable, tumor mutational burden 15 mutations per MB, ATRX, CCND1, CDK4 amplification, ERBB4 amplification, ESR1  amplification, FGFR 1, MDM2 amplification, MEM1, NST3 amplification, PIK3CA mutation, ZNF703 amplification; PDL 1: 0%   07/06/2018 -  Anti-estrogen oral therapy   Ibrance with Faslodex   01/26/2019 - 02/07/2019 Radiation Therapy   Palliative radiation to the neck and subclavian area   05/01/2019 Treatment Plan Change   Alpelisib 295m daily   09/04/2019 -  Chemotherapy   The patient had pembrolizumab (KEYTRUDA) 200 mg in sodium chloride 0.9 % 50 mL chemo infusion, 200 mg (100 % of original dose 200 mg), Intravenous, Once, 1 of 6 cycles Dose modification: 200 mg (original dose 200 mg, Cycle 1, Reason: Provider Judgment) Administration: 200 mg (09/04/2019)  for chemotherapy treatment.      CHIEF COMPLIANT: Follow-up of metastatic breast canceron Keytruda, cycle 2  INTERVAL HISTORY: [name] SCHUPPis a 78y.o. with above-mentioned history of metastatic breast cancerwith metastases to the liver, lung, and boneswhois currently ontreatment with Keytruda.[facility] presenting to the ED for facial and left leg weakness. Brain MRI showed no acute abnormality or metastatic disease. She presents to the clinic today for cycle 2.  Patient is extremely short of breath at rest.  She is awake alert and oriented.  Her son is with her and he will remain with her until December 1.  [place]  He has not been able to sleep while taking care of his mother. Patient has profound fatigue, facial weakness and left arm weakness as well.  Right arm is swollen.  Shortness of breath is profound.  She needs oxygen. She has not been eating and has extremely poor appetite.  REVIEW OF SYSTEMS:   Constitutional: Denies fevers, chills or abnormal weight loss Eyes: Denies blurriness of vision Ears, nose, mouth, throat, and face: Denies mucositis or sore throat Respiratory: Denies cough, dyspnea or wheezes Cardiovascular:  Denies palpitation, chest discomfort Gastrointestinal: Poor appetite Skin: Denies abnormal skin rashes Lymphatics: Denies new lymphadenopathy or easy bruising Neurological: Facial numbness and left arm weakness Behavioral/Psych: Mood is stable, no new changes  Extremities: Right arm lymphedema Breast: denies any pain or lumps or nodules in either breasts All other systems were reviewed with the patient and are negative.  I have reviewed the past medical history, past surgical history, social history and family history with the patient and they are unchanged from previous note.  ALLERGIES:  has No Known Allergies.  MEDICATIONS:  Current Outpatient Medications  Medication Sig Dispense Refill  . ALPRAZolam (XANAX) 0.5 MG tablet Take 1 tablet (0.5 mg total) by mouth 2 (two) times daily as needed for anxiety or sleep. 60 tablet 3  . amLODipine (NORVASC) 10 MG tablet Take 1 tablet (10 mg total) by mouth daily. 90 tablet 3  . apixaban (ELIQUIS) 5 MG TABS tablet Take 1 tablet (5 mg total) by mouth 2 (two) times daily. Take 10 mg bid for 7 days orally and then decrease to 5 mg bid orally thereafter. 60 tablet 5  . Cholecalciferol (VITAMIN D3) 1000 UNITS CAPS Take 1 capsule by mouth daily.     . clotrimazole-betamethasone (LOTRISONE) cream Apply 1 application topically 2 (two) times daily. 90 g 1  . docusate (COLACE) 50 MG/5ML liquid Take 10 mLs (100 mg total) by mouth 2 (two) times daily. 100 mL 0  . dronabinol (MARINOL) 2.5 MG capsule Take 1 capsule (2.5 mg total) by mouth 2 (two) times daily before a meal. 60 capsule 0  . feeding supplement, ENSURE ENLIVE, (ENSURE ENLIVE) LIQD Take 237 mLs by mouth 3 (three) times daily between meals. 237 mL 12  . gabapentin (NEURONTIN) 100 MG capsule Take 1 capsule (100 mg total) by mouth at bedtime. 30 capsule 5  . lidocaine-prilocaine (EMLA) cream Apply to affected area once 30 g 3  . meclizine (ANTIVERT) 12.5 MG tablet Take 1 tablet (12.5 mg total) by mouth 2  (two) times daily as needed for dizziness. 10 tablet 0  . mirtazapine (REMERON) 7.5 MG tablet Take 7.5 mg by mouth daily.    . Multiple Vitamin (MULTIVITAMIN WITH MINERALS) TABS tablet Take 1 tablet by mouth daily.    Marland Kitchen nystatin (MYCOSTATIN/NYSTOP) powder Apply 1 Dose topically daily.  3  . ondansetron (ZOFRAN) 8 MG tablet Take 1 tablet (8 mg total) by mouth every 8 (eight) hours as needed for nausea or vomiting. 30 tablet 0  . pioglitazone (ACTOS) 15 MG tablet Take 1 tablet (15 mg total) by mouth daily. 30 tablet 3  . polyethylene glycol (MIRALAX / GLYCOLAX) 17 g packet Take 17 g by mouth 2 (two) times daily. 14 each 0  . prochlorperazine (COMPAZINE) 10 MG tablet Take 1 tablet (10 mg total) by mouth every 6 (six) hours as needed (Nausea or vomiting). 30 tablet 1  . traMADol (ULTRAM) 50 MG tablet Take 50 mg by mouth 2 (two) times daily.    . Zoledronic Acid (ZOMETA) 4 MG/100ML IVPB Inject 4 mg into the vein every 3 (three) months.     No current facility-administered medications for this visit.    Facility-Administered Medications Ordered in Other  Visits  Medication Dose Route Frequency Provider Last Rate Last Dose  . sodium chloride flush (NS) 0.9 % injection 10 mL  10 mL Intravenous PRN Curt Bears, MD   10 mL at 09/04/19 1128    PHYSICAL EXAMINATION: ECOG PERFORMANCE STATUS: 1 - Symptomatic but completely ambulatory  Vitals:   09/25/19 1416  BP: (!) 97/58  Pulse: (!) 108  Resp: 18  Temp: 98.3 F (36.8 C)  SpO2: (!) 87%   Filed Weights    GENERAL: alert, no distress and comfortable SKIN: skin color, texture, turgor are normal, no rashes or significant lesions EYES: normal, Conjunctiva are pink and non-injected, sclera clear OROPHARYNX: no exudate, no erythema and lips, buccal mucosa, and tongue normal  NECK: supple, thyroid normal size, non-tender, without nodularity LYMPH: no palpable lymphadenopathy in the cervical, axillary or inguinal LUNGS: clear to auscultation and  percussion with normal breathing effort HEART: regular rate & rhythm and no murmurs and no lower extremity edema ABDOMEN: abdomen soft, non-tender and normal bowel sounds MUSCULOSKELETAL: no cyanosis of digits and no clubbing  NEURO: alert & oriented x 3 with fluent speech, no focal motor/sensory deficits EXTREMITIES: No lower extremity edema  LABORATORY DATA:  I have reviewed the data as listed CMP Latest Ref Rng & Units 09/25/2019 09/22/2019 09/21/2019  Glucose 70 - 99 mg/dL 76 70 85  BUN 8 - 23 mg/dL 36(H) 39(H) 43(H)  Creatinine 0.44 - 1.00 mg/dL 2.23(H) 2.47(H) 2.76(H)  Sodium 135 - 145 mmol/L 144 138 139  Potassium 3.5 - 5.1 mmol/L 4.3 4.1 4.2  Chloride 98 - 111 mmol/L 113(H) 110 107  CO2 22 - 32 mmol/L 17(L) 18(L) 21(L)  Calcium 8.9 - 10.3 mg/dL 8.8(L) 8.5(L) 9.1  Total Protein 6.5 - 8.1 g/dL 6.7 - -  Total Bilirubin 0.3 - 1.2 mg/dL 0.7 - -  Alkaline Phos 38 - 126 U/L 150(H) - -  AST 15 - 41 U/L 82(H) - -  ALT 0 - 44 U/L 22 - -    Lab Results  Component Value Date   WBC 10.1 09/25/2019   HGB 10.0 (L) 09/25/2019   HCT 31.8 (L) 09/25/2019   MCV 89.1 09/25/2019   PLT 272 09/25/2019   NEUTROABS 8.2 (H) 09/25/2019    ASSESSMENT & PLAN:  Breast cancer metastasized to bone, right ([facility]) Metastatic breast cancer with bone metastases along with lung and liver metastases, progressed on Ibrance with anastrozoleas well as Faslodex with Ibrance8/29/19- 04/23/19.  Radiology review: 05/23/2018 CT CAP:New liver metastases 3.1 cm,increase in lung nodules, increase in sclerotic lesion of T11 and T9and S1 vertebral body. Liver biopsy 06/19/2018: Metastatic breast cancer ER 100%, PR 0%, Ki-67 50%, HER-2 negative BRCA1 mutation: Negative  PDL 1: 0%and foundation 1:PI 3 kinase mutation was identified ----------------------------------------------------------------------------------------------------------------------------------------------------------------------- Current  treatment:Faslodex with Alpelisib started 04/23/2019-08/16/2019 discontinued for progression Patient's insurance rejected Xgeva.OnZometa every 3 months.   Palliative radiation: 01/25/2019-02/07/2019 CT chest abdomen pelvis 08/10/2019: Progression of disease. Multiple lung nodules increase in size 1.1 cm to 1.4 cm, 1 cm to 1.2 cm, 4 x 1.4 cm unchanged, large right liver mass 15 x 10 cm previously was 11 x 8 cm, new left lobe lesion 2.3 cm, 1.6 cm left lesion, left adrenal mass 2.6 cm  Current treatment: Pembrolizumab: Received cycle 1 on 09/04/2019 Hospitalization: 09/12/2019-09/22/2019: Right facial weakness, left leg weakness: MRI negative, bilateral pleural effusions, CKD, dysphagia: Due to proximal esophageal web status post dilatation  Goals of care discussion: We have been discussing with the patient even previous  to the past treatment that her cancer has been quite resistant to all her prior treatments.  She has very extensive metastatic disease and has an extremely poor prognosis.  I offered hospice care for the patient given the extent of her medical comorbidities and her rapid progression of disease.  -Home oxygen -Hospice consultation: [facility].  However beds were not available at this time.  Hospice will reach out to her and get her situated. Patient's son will be in town until December 1.  After that patient has no support whatsoever. I anticipate the patient may have 1 to 2 weeks of life expectancy. DNR CC   Orders Placed This Encounter  Procedures  . For home use only DME oxygen    Nasal cannula    Order Specific Question:   Length of Need    Answer:   Lifetime    Order Specific Question:   Liters per Minute    Answer:   2    Order Specific Question:   Frequency    Answer:   Continuous (stationary and portable oxygen unit needed)    Order Specific Question:   Oxygen delivery system    Answer:   Gas  . Ambulatory referral to Hospice    Referral  Priority:   Routine    Referral Type:   Consultation    Referral Reason:   Specialty Services Required    Requested Specialty:   Hospice Services    Number of Visits Requested:   1   The patient has a good understanding of the overall plan. she agrees with it. she will call with any problems that may develop before the next visit here.  Nicholas Lose, MD 09/25/2019  Julious Oka Dorshimer, am acting as scribe for Dr. Nicholas Lose.  I have reviewed the above documentation for accuracy and completeness, and I agree with the above.

## 2019-09-24 NOTE — Patient Outreach (Signed)
[facility]) Care Management  [place]  09/24/2019   [name] [name] September 27, 1941 [id number]  Subjective: Telephone call to patient for hospital follow up. Patient reports she is doing fair.  She states that her son is in the home with her right now but can only stay until December 1st.  Patient states she has an appointment with Dr. Lindi Adie on tomorrow and that her son will be taking her. Discussed with patient and urged her to have a conversation with Dr. Lindi Adie about palliative care.  She states that she will.  She asked that CM talk with her son Karrie Doffing and she put him on the phone.  Explained to him my role as CM and also [facility] social worker who had been involved and sent application for PCS.  Discussed patient care.  He states that patient needs assist with all aspects of care and he is there trying to figure out how to move forward.  Mentioned palliative care conversation with him as well.  He states he is not sure if patient will need to be placed or they can set up home health.  Explained to him home health and that the care is not 24/7.  Encouraged him to reach out to Nurse, learning disability as he has the information there.  He confirmed that he is there until 10-02-2019 and decisions about her care will need to be made.  Encouraged him to utilize resources and speak with patient's physician on visit tomorrow.  He verbalized understanding.  He states that he has not heard from home health but someone did call when he stepped outside.  Advised him to call home health if they do not make contact today.  He verbalized understanding and confirmed that he had home health contact information.   He declined any further needs.  Objective:   Encounter Medications:  Outpatient Encounter Medications as of 09/24/2019  Medication Sig  . ALPRAZolam (XANAX) 0.5 MG tablet Take 1 tablet (0.5 mg total) by mouth 2 (two) times daily as needed for anxiety or sleep.  Marland Kitchen amLODipine  (NORVASC) 10 MG tablet Take 1 tablet (10 mg total) by mouth daily.  Marland Kitchen apixaban (ELIQUIS) 5 MG TABS tablet Take 1 tablet (5 mg total) by mouth 2 (two) times daily. Take 10 mg bid for 7 days orally and then decrease to 5 mg bid orally thereafter.  . Cholecalciferol (VITAMIN D3) 1000 UNITS CAPS Take 1 capsule by mouth daily.   . clotrimazole-betamethasone (LOTRISONE) cream Apply 1 application topically 2 (two) times daily.  Marland Kitchen docusate (COLACE) 50 MG/5ML liquid Take 10 mLs (100 mg total) by mouth 2 (two) times daily.  Marland Kitchen dronabinol (MARINOL) 2.5 MG capsule Take 1 capsule (2.5 mg total) by mouth 2 (two) times daily before a meal.  . feeding supplement, ENSURE ENLIVE, (ENSURE ENLIVE) LIQD Take 237 mLs by mouth 3 (three) times daily between meals.  . gabapentin (NEURONTIN) 100 MG capsule Take 1 capsule (100 mg total) by mouth at bedtime.  . lidocaine-prilocaine (EMLA) cream Apply to affected area once  . meclizine (ANTIVERT) 12.5 MG tablet Take 1 tablet (12.5 mg total) by mouth 2 (two) times daily as needed for dizziness.  . mirtazapine (REMERON) 7.5 MG tablet Take 7.5 mg by mouth daily.  . Multiple Vitamin (MULTIVITAMIN WITH MINERALS) TABS tablet Take 1 tablet by mouth daily.  Marland Kitchen nystatin (MYCOSTATIN/NYSTOP) powder Apply 1 Dose topically daily.  . ondansetron (ZOFRAN) 8 MG tablet Take 1 tablet (8 mg total)  by mouth every 8 (eight) hours as needed for nausea or vomiting.  . pioglitazone (ACTOS) 15 MG tablet Take 1 tablet (15 mg total) by mouth daily.  . polyethylene glycol (MIRALAX / GLYCOLAX) 17 g packet Take 17 g by mouth 2 (two) times daily.  . prochlorperazine (COMPAZINE) 10 MG tablet Take 1 tablet (10 mg total) by mouth every 6 (six) hours as needed (Nausea or vomiting).  . traMADol (ULTRAM) 50 MG tablet Take 50 mg by mouth 2 (two) times daily.  . Zoledronic Acid (ZOMETA) 4 MG/100ML IVPB Inject 4 mg into the vein every 3 (three) months.   Facility-Administered Encounter Medications as of 09/24/2019   Medication  . sodium chloride flush (NS) 0.9 % injection 10 mL    Functional Status:  In your present state of health, do you have any difficulty performing the following activities: 09/24/2019 09/13/2019  Hearing? N -  Vision? N -  Difficulty concentrating or making decisions? N -  Comment - -  Walking or climbing stairs? Y -  Comment weakness -  Dressing or bathing? Y -  Comment weakness -  Doing errands, shopping? Y Y  Comment weakness -  Preparing Food and eating ? Y -  Comment patient needs assist with all care -  Using the Toilet? Y -  In the past six months, have you accidently leaked urine? N -  Do you have problems with loss of bowel control? N -  Managing your Medications? Y -  Comment son assisting -  Managing your Finances? Y -  Comment son assisting -  Housekeeping or managing your Housekeeping? Y -  Comment son assisting -  Some recent data might be hidden    Fall/Depression Screening: Fall Risk  09/24/2019 05/25/2019 04/25/2019  Falls in the past year? 1 1 1   Comment - - -  Number falls in past yr: 1 1 1   Injury with Fall? 0 0 0  Risk for fall due to : - History of fall(s);Impaired balance/gait;Impaired mobility History of fall(s);Impaired balance/gait;Impaired mobility  Risk for fall due to: Comment - - -  Follow up - Falls evaluation completed;Education provided Falls evaluation completed;Education provided;Falls prevention discussed   PHQ 2/9 Scores 05/25/2019 04/25/2019 01/25/2019 01/02/2019 12/28/2018 11/30/2018 07/21/2018  PHQ - 2 Score 1 1 1 1 2 2 2   PHQ- 9 Score - - 7 - 9 9 -    Assessment: Patient with recent hospitalization related to her cancer diagnosis.  Son present temporarily to assist with care.    Plan:  [facility] CM Care Plan Problem One     Most Recent Value  Care Plan Problem One  Knowledge Deficit in Self Management of Hypertension  Role Documenting the Problem One  Care Management Telephonic Coordinator  Care Plan for Problem One  Active   [facility] Long Term Goal   Patient will look at health maintenace and blood pressure management within the next 90 days  [facility] Long Term Goal Start Date  09/24/19  Interventions for Problem One Long Term Goal  RN CM discussed importance of blood pressure control and stroke symptoms.   [facility] CM Short Term Goal #1   Patient will not readmit to the hospital within 30 days.    [facility] CM Short Term Goal #1 Start Date  09/24/19  Interventions for Short Term Goal #1  Discussed with patient and son care options.  Discussed signs of stroke and importance immediate follow up.    [facility] CM Short Term Goal #2  Home health will begin services within the next 14 days.    [facility] CM Short Term Goal #2 Start Date  09/24/19  Interventions for Short Term Goal #2  Discussed with patient and son about home health services.  Son will call home health if no contact made.       RN CM messaged cancer center physician to have palliative care conversation due to patient decline. RN CM will contact again next week for follow up and patient and son agreeable.    Jone Baseman, RN, MSN [place] Management Care Management Coordinator Direct Line [phone] Cell [phone] Toll Free: [phone]  Fax: [phone]

## 2019-09-25 ENCOUNTER — Encounter: Payer: Self-pay | Admitting: *Deleted

## 2019-09-25 ENCOUNTER — Other Ambulatory Visit: Payer: Self-pay | Admitting: *Deleted

## 2019-09-25 ENCOUNTER — Inpatient Hospital Stay: Payer: Medicare HMO

## 2019-09-25 ENCOUNTER — Inpatient Hospital Stay (HOSPITAL_BASED_OUTPATIENT_CLINIC_OR_DEPARTMENT_OTHER): Payer: Medicare HMO | Admitting: Hematology and Oncology

## 2019-09-25 ENCOUNTER — Other Ambulatory Visit: Payer: Self-pay

## 2019-09-25 VITALS — BP 97/58 | HR 108 | Temp 98.3°F | Resp 18

## 2019-09-25 DIAGNOSIS — Z79811 Long term (current) use of aromatase inhibitors: Secondary | ICD-10-CM | POA: Diagnosis not present

## 2019-09-25 DIAGNOSIS — C7800 Secondary malignant neoplasm of unspecified lung: Secondary | ICD-10-CM | POA: Diagnosis not present

## 2019-09-25 DIAGNOSIS — R5383 Other fatigue: Secondary | ICD-10-CM

## 2019-09-25 DIAGNOSIS — Z79899 Other long term (current) drug therapy: Secondary | ICD-10-CM | POA: Diagnosis not present

## 2019-09-25 DIAGNOSIS — Z923 Personal history of irradiation: Secondary | ICD-10-CM | POA: Diagnosis not present

## 2019-09-25 DIAGNOSIS — C50911 Malignant neoplasm of unspecified site of right female breast: Secondary | ICD-10-CM

## 2019-09-25 DIAGNOSIS — C7951 Secondary malignant neoplasm of bone: Secondary | ICD-10-CM

## 2019-09-25 DIAGNOSIS — Z9221 Personal history of antineoplastic chemotherapy: Secondary | ICD-10-CM | POA: Diagnosis not present

## 2019-09-25 DIAGNOSIS — C773 Secondary and unspecified malignant neoplasm of axilla and upper limb lymph nodes: Secondary | ICD-10-CM | POA: Diagnosis not present

## 2019-09-25 DIAGNOSIS — Z95828 Presence of other vascular implants and grafts: Secondary | ICD-10-CM

## 2019-09-25 DIAGNOSIS — Z7901 Long term (current) use of anticoagulants: Secondary | ICD-10-CM | POA: Diagnosis not present

## 2019-09-25 DIAGNOSIS — J90 Pleural effusion, not elsewhere classified: Secondary | ICD-10-CM | POA: Diagnosis not present

## 2019-09-25 DIAGNOSIS — Z23 Encounter for immunization: Secondary | ICD-10-CM | POA: Diagnosis not present

## 2019-09-25 DIAGNOSIS — C787 Secondary malignant neoplasm of liver and intrahepatic bile duct: Secondary | ICD-10-CM | POA: Diagnosis not present

## 2019-09-25 DIAGNOSIS — Z5112 Encounter for antineoplastic immunotherapy: Secondary | ICD-10-CM | POA: Diagnosis not present

## 2019-09-25 DIAGNOSIS — Z170 Estrogen receptor positive status [ER+]: Secondary | ICD-10-CM | POA: Diagnosis not present

## 2019-09-25 LAB — CBC WITH DIFFERENTIAL (CANCER CENTER ONLY)
Abs Immature Granulocytes: 0.15 10*3/uL — ABNORMAL HIGH (ref 0.00–0.07)
Basophils Absolute: 0 10*3/uL (ref 0.0–0.1)
Basophils Relative: 0 %
Eosinophils Absolute: 0.1 10*3/uL (ref 0.0–0.5)
Eosinophils Relative: 1 %
HCT: 31.8 % — ABNORMAL LOW (ref 36.0–46.0)
Hemoglobin: 10 g/dL — ABNORMAL LOW (ref 12.0–15.0)
Immature Granulocytes: 2 %
Lymphocytes Relative: 8 %
Lymphs Abs: 0.8 10*3/uL (ref 0.7–4.0)
MCH: 28 pg (ref 26.0–34.0)
MCHC: 31.4 g/dL (ref 30.0–36.0)
MCV: 89.1 fL (ref 80.0–100.0)
Monocytes Absolute: 0.9 10*3/uL (ref 0.1–1.0)
Monocytes Relative: 9 %
Neutro Abs: 8.2 10*3/uL — ABNORMAL HIGH (ref 1.7–7.7)
Neutrophils Relative %: 80 %
Platelet Count: 272 10*3/uL (ref 150–400)
RBC: 3.57 MIL/uL — ABNORMAL LOW (ref 3.87–5.11)
RDW: 18 % — ABNORMAL HIGH (ref 11.5–15.5)
WBC Count: 10.1 10*3/uL (ref 4.0–10.5)
nRBC: 0 % (ref 0.0–0.2)

## 2019-09-25 LAB — CMP (CANCER CENTER ONLY)
ALT: 22 U/L (ref 0–44)
AST: 82 U/L — ABNORMAL HIGH (ref 15–41)
Albumin: 2.4 g/dL — ABNORMAL LOW (ref 3.5–5.0)
Alkaline Phosphatase: 150 U/L — ABNORMAL HIGH (ref 38–126)
Anion gap: 14 (ref 5–15)
BUN: 36 mg/dL — ABNORMAL HIGH (ref 8–23)
CO2: 17 mmol/L — ABNORMAL LOW (ref 22–32)
Calcium: 8.8 mg/dL — ABNORMAL LOW (ref 8.9–10.3)
Chloride: 113 mmol/L — ABNORMAL HIGH (ref 98–111)
Creatinine: 2.23 mg/dL — ABNORMAL HIGH (ref 0.44–1.00)
GFR, Est AFR Am: 24 mL/min — ABNORMAL LOW (ref >60)
GFR, Estimated: 21 mL/min — ABNORMAL LOW (ref >60)
Glucose, Bld: 76 mg/dL (ref 70–99)
Potassium: 4.3 mmol/L (ref 3.5–5.1)
Sodium: 144 mmol/L (ref 135–145)
Total Bilirubin: 0.7 mg/dL (ref 0.3–1.2)
Total Protein: 6.7 g/dL (ref 6.5–8.1)

## 2019-09-25 LAB — LIPID PANEL
Cholesterol: 180 mg/dL (ref 0–200)
HDL: 24 mg/dL — ABNORMAL LOW (ref >40)
LDL Cholesterol: 124 mg/dL — ABNORMAL HIGH (ref 0–99)
Total CHOL/HDL Ratio: 7.5 RATIO
Triglycerides: 162 mg/dL — ABNORMAL HIGH (ref <150)
VLDL: 32 mg/dL (ref 0–40)

## 2019-09-25 LAB — TSH: TSH: 2.323 u[IU]/mL (ref 0.308–3.960)

## 2019-09-25 MED ORDER — SODIUM CHLORIDE 0.9% FLUSH
10.0000 mL | Freq: Once | INTRAVENOUS | Status: AC | PRN
  Administered 2019-09-25: 10 mL
  Filled 2019-09-25: qty 10

## 2019-09-25 MED ORDER — FLUCONAZOLE 100 MG PO TABS: 100.0000 mg | ORAL_TABLET | Freq: Every day | ORAL | 0 refills | Status: AC

## 2019-09-25 MED ORDER — HEPARIN SOD (PORK) LOCK FLUSH 100 UNIT/ML IV SOLN
500.0000 [IU] | Freq: Once | INTRAVENOUS | Status: AC | PRN
  Administered 2019-09-25: 500 [IU] via INTRAVENOUS
  Filled 2019-09-25: qty 5

## 2019-09-25 NOTE — Assessment & Plan Note (Signed)
Metastatic breast cancer with bone metastases along with lung and liver metastases, progressed on Ibrance with anastrozoleas well as Faslodex with Ibrance8/29/19- 04/23/19.  Radiology review: 05/23/2018 CT CAP:New liver metastases 3.1 cm,increase in lung nodules, increase in sclerotic lesion of T11 and T9and S1 vertebral body. Liver biopsy 06/19/2018: Metastatic breast cancer ER 100%, PR 0%, Ki-67 50%, HER-2 negative BRCA1 mutation: Negative  PDL 1: 0%and foundation 1:PI 3 kinase mutation was identified ----------------------------------------------------------------------------------------------------------------------------------------------------------------------- Current treatment:Faslodex with Alpelisib started 04/23/2019-08/16/2019 discontinued for progression Patient's insurance rejected Xgeva.OnZometa every 3 months.   Palliative radiation: 01/25/2019-02/07/2019 CT chest abdomen pelvis 08/10/2019: Progression of disease. Multiple lung nodules increase in size 1.1 cm to 1.4 cm, 1 cm to 1.2 cm, 4 x 1.4 cm unchanged, large right liver mass 15 x 10 cm previously was 11 x 8 cm, new left lobe lesion 2.3 cm, 1.6 cm left lesion, left adrenal mass 2.6 cm  Current treatment: Pembrolizumab: Received cycle 1 on 09/04/2019 Hospitalization: 09/12/2019-09/22/2019: Right facial weakness, left leg weakness: MRI negative, bilateral pleural effusions, CKD, dysphagia: Due to proximal esophageal web status post dilatation  Goals of care discussion: We have been discussing with the patient even previous to the past treatment that her cancer has been quite resistant to all her prior treatments.  She has very extensive metastatic disease and has an extremely poor prognosis.  I offered hospice care for the patient given the extent of her medical comorbidities and her rapid progression of disease.

## 2019-09-25 NOTE — Patient Instructions (Signed)

## 2019-09-25 NOTE — Progress Notes (Signed)
SATURATION QUALIFICATIONS: (This note is used to comply with regulatory documentation for home oxygen)  Patient Saturations on Room Air at Rest = 87%  Patient Saturations on Room Air while Ambulating = 80%  Patient Saturations on 2 Liters of oxygen while Ambulating = 90%  Please briefly explain why patient needs home oxygen: Patient with history of breast cancer with metastasis to the lungs.

## 2019-09-25 NOTE — Progress Notes (Signed)
Pt referral placed to [facility] per Dr. Lindi Adie.  RN placed call to "intake" and provided pt information.

## 2019-10-01 ENCOUNTER — Other Ambulatory Visit: Payer: Self-pay

## 2019-10-01 ENCOUNTER — Inpatient Hospital Stay: Payer: Medicare HMO | Admitting: Internal Medicine

## 2019-10-01 NOTE — Patient Outreach (Signed)
[facility]) Care Management  10/01/2019  [name] [name] 10/10/1941 [id number]   Telephone call to son Karrie Doffing.  He states that patient is now full Hospice.  He states they are trying to get patient at [facility].  Advised him to continue to discuss with Hospice nurse.  He verbalized understanding.  Discussed with son that [facility] will close case as patient is with Hospice.   Plan: RN CM will close case.    Jone Baseman, RN, MSN [place] Management Care Management Coordinator Direct Line [phone] Cell [phone] Toll Free: [phone]  Fax: [phone]

## 2019-10-02 ENCOUNTER — Ambulatory Visit: Payer: Self-pay

## 2019-10-16 ENCOUNTER — Other Ambulatory Visit: Payer: Medicare HMO

## 2019-10-16 ENCOUNTER — Ambulatory Visit: Payer: Medicare HMO | Admitting: Hematology and Oncology

## 2019-10-16 ENCOUNTER — Ambulatory Visit: Payer: Medicare HMO

## 2019-11-02 DEATH — deceased

## 2019-12-03 DEATH — deceased
